# Patient Record
Sex: Male | Born: 1950 | Race: White | Hispanic: No | Marital: Married | Smoking: Former smoker
Health system: Southern US, Community
[De-identification: ages and names within clinical notes are randomized; demographics above are authoritative.]

## PROBLEM LIST (undated history)

## (undated) DIAGNOSIS — I499 Cardiac arrhythmia, unspecified: Secondary | ICD-10-CM

## (undated) DIAGNOSIS — R7881 Bacteremia: Secondary | ICD-10-CM

## (undated) DIAGNOSIS — R011 Cardiac murmur, unspecified: Secondary | ICD-10-CM

## (undated) DIAGNOSIS — D735 Infarction of spleen: Secondary | ICD-10-CM

## (undated) DIAGNOSIS — B952 Enterococcus as the cause of diseases classified elsewhere: Secondary | ICD-10-CM

## (undated) DIAGNOSIS — I509 Heart failure, unspecified: Secondary | ICD-10-CM

## (undated) DIAGNOSIS — G2581 Restless legs syndrome: Secondary | ICD-10-CM

## (undated) DIAGNOSIS — Z9889 Other specified postprocedural states: Secondary | ICD-10-CM

## (undated) DIAGNOSIS — Z951 Presence of aortocoronary bypass graft: Secondary | ICD-10-CM

## (undated) DIAGNOSIS — I2510 Atherosclerotic heart disease of native coronary artery without angina pectoris: Secondary | ICD-10-CM

## (undated) DIAGNOSIS — G43909 Migraine, unspecified, not intractable, without status migrainosus: Secondary | ICD-10-CM

## (undated) DIAGNOSIS — R413 Other amnesia: Secondary | ICD-10-CM

## (undated) DIAGNOSIS — I058 Other rheumatic mitral valve diseases: Secondary | ICD-10-CM

## (undated) DIAGNOSIS — M1990 Unspecified osteoarthritis, unspecified site: Secondary | ICD-10-CM

## (undated) DIAGNOSIS — E43 Unspecified severe protein-calorie malnutrition: Secondary | ICD-10-CM

## (undated) DIAGNOSIS — I351 Nonrheumatic aortic (valve) insufficiency: Secondary | ICD-10-CM

## (undated) DIAGNOSIS — F32A Depression, unspecified: Secondary | ICD-10-CM

## (undated) DIAGNOSIS — I639 Cerebral infarction, unspecified: Secondary | ICD-10-CM

## (undated) DIAGNOSIS — I340 Nonrheumatic mitral (valve) insufficiency: Secondary | ICD-10-CM

## (undated) DIAGNOSIS — I76 Septic arterial embolism: Secondary | ICD-10-CM

## (undated) DIAGNOSIS — R0600 Dyspnea, unspecified: Secondary | ICD-10-CM

## (undated) DIAGNOSIS — I358 Other nonrheumatic aortic valve disorders: Secondary | ICD-10-CM

## (undated) DIAGNOSIS — K759 Inflammatory liver disease, unspecified: Secondary | ICD-10-CM

## (undated) DIAGNOSIS — Z953 Presence of xenogenic heart valve: Secondary | ICD-10-CM

## (undated) DIAGNOSIS — G459 Transient cerebral ischemic attack, unspecified: Secondary | ICD-10-CM

## (undated) DIAGNOSIS — I480 Paroxysmal atrial fibrillation: Secondary | ICD-10-CM

## (undated) DIAGNOSIS — I4892 Unspecified atrial flutter: Secondary | ICD-10-CM

## (undated) DIAGNOSIS — C61 Malignant neoplasm of prostate: Secondary | ICD-10-CM

## (undated) DIAGNOSIS — I071 Rheumatic tricuspid insufficiency: Secondary | ICD-10-CM

## (undated) DIAGNOSIS — F329 Major depressive disorder, single episode, unspecified: Secondary | ICD-10-CM

## (undated) DIAGNOSIS — I10 Essential (primary) hypertension: Secondary | ICD-10-CM

## (undated) HISTORY — DX: Rheumatic tricuspid insufficiency: I07.1

## (undated) HISTORY — PX: PROSTATECTOMY: SHX69

## (undated) HISTORY — DX: Other amnesia: R41.3

## (undated) HISTORY — PX: TONSILLECTOMY: SUR1361

## (undated) HISTORY — PX: EYE SURGERY: SHX253

## (undated) HISTORY — DX: Migraine, unspecified, not intractable, without status migrainosus: G43.909

## (undated) HISTORY — DX: Nonrheumatic mitral (valve) insufficiency: I34.0

---
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
## 2007-06-21 DIAGNOSIS — F988 Other specified behavioral and emotional disorders with onset usually occurring in childhood and adolescence: Secondary | ICD-10-CM

## 2007-06-21 DIAGNOSIS — J309 Allergic rhinitis, unspecified: Secondary | ICD-10-CM

## 2008-08-08 ENCOUNTER — Emergency Department (HOSPITAL_COMMUNITY): Admission: EM | Admit: 2008-08-08 | Discharge: 2008-08-08 | Payer: Self-pay | Admitting: Emergency Medicine

## 2008-08-16 DIAGNOSIS — R51 Headache: Secondary | ICD-10-CM

## 2010-06-03 LAB — DIFFERENTIAL
Basophils Absolute: 0 10*3/uL (ref 0.0–0.1)
Eosinophils Relative: 2 % (ref 0–5)
Lymphocytes Relative: 29 % (ref 12–46)
Monocytes Absolute: 0.5 10*3/uL (ref 0.1–1.0)
Monocytes Relative: 11 % (ref 3–12)
Neutro Abs: 2.8 10*3/uL (ref 1.7–7.7)

## 2010-06-03 LAB — CBC
HCT: 45.3 % (ref 39.0–52.0)
Hemoglobin: 15.7 g/dL (ref 13.0–17.0)
MCHC: 34.7 g/dL (ref 30.0–36.0)
MCV: 89.7 fL (ref 78.0–100.0)
RBC: 5.05 MIL/uL (ref 4.22–5.81)
RDW: 13 % (ref 11.5–15.5)

## 2010-06-03 LAB — BASIC METABOLIC PANEL
CO2: 23 mEq/L (ref 19–32)
Calcium: 9.4 mg/dL (ref 8.4–10.5)
GFR calc Af Amer: >60 mL/min (ref >60)
GFR calc non Af Amer: >60 mL/min (ref >60)
Glucose, Bld: 105 mg/dL — ABNORMAL HIGH (ref 70–99)
Potassium: 4.2 mEq/L (ref 3.5–5.1)
Sodium: 137 mEq/L (ref 135–145)

## 2010-06-03 LAB — POCT CARDIAC MARKERS
CKMB, poc: <1 ng/mL — ABNORMAL LOW (ref 1.0–8.0)
Troponin i, poc: <0.05 ng/mL (ref 0.00–0.09)

## 2011-07-23 DIAGNOSIS — C61 Malignant neoplasm of prostate: Secondary | ICD-10-CM | POA: Diagnosis present

## 2014-10-17 ENCOUNTER — Inpatient Hospital Stay (HOSPITAL_COMMUNITY)
Admission: EM | Admit: 2014-10-17 | Discharge: 2014-11-12 | DRG: 853 | Disposition: A | Discharge status: Home Health | Payer: BC Managed Care – PPO | Attending: Thoracic Surgery (Cardiothoracic Vascular Surgery) | Admitting: Thoracic Surgery (Cardiothoracic Vascular Surgery)

## 2014-10-17 DIAGNOSIS — I63412 Cerebral infarction due to embolism of left middle cerebral artery: Secondary | ICD-10-CM | POA: Diagnosis present

## 2014-10-17 DIAGNOSIS — D739 Disease of spleen, unspecified: Secondary | ICD-10-CM

## 2014-10-17 DIAGNOSIS — I330 Acute and subacute infective endocarditis: Secondary | ICD-10-CM | POA: Diagnosis present

## 2014-10-17 DIAGNOSIS — I70209 Unspecified atherosclerosis of native arteries of extremities, unspecified extremity: Secondary | ICD-10-CM | POA: Diagnosis present

## 2014-10-17 DIAGNOSIS — K009 Disorder of tooth development, unspecified: Secondary | ICD-10-CM | POA: Diagnosis present

## 2014-10-17 DIAGNOSIS — D735 Infarction of spleen: Secondary | ICD-10-CM | POA: Diagnosis present

## 2014-10-17 DIAGNOSIS — E8770 Fluid overload, unspecified: Secondary | ICD-10-CM | POA: Diagnosis not present

## 2014-10-17 DIAGNOSIS — G9340 Encephalopathy, unspecified: Secondary | ICD-10-CM

## 2014-10-17 DIAGNOSIS — K0530 Chronic periodontitis, unspecified: Secondary | ICD-10-CM | POA: Diagnosis present

## 2014-10-17 DIAGNOSIS — I339 Acute and subacute endocarditis, unspecified: Secondary | ICD-10-CM | POA: Diagnosis present

## 2014-10-17 DIAGNOSIS — D62 Acute posthemorrhagic anemia: Secondary | ICD-10-CM | POA: Diagnosis not present

## 2014-10-17 DIAGNOSIS — R778 Other specified abnormalities of plasma proteins: Secondary | ICD-10-CM | POA: Diagnosis present

## 2014-10-17 DIAGNOSIS — Z953 Presence of xenogenic heart valve: Secondary | ICD-10-CM

## 2014-10-17 DIAGNOSIS — I059 Rheumatic mitral valve disease, unspecified: Secondary | ICD-10-CM | POA: Diagnosis present

## 2014-10-17 DIAGNOSIS — D7389 Other diseases of spleen: Secondary | ICD-10-CM

## 2014-10-17 DIAGNOSIS — E43 Unspecified severe protein-calorie malnutrition: Secondary | ICD-10-CM | POA: Diagnosis present

## 2014-10-17 DIAGNOSIS — J90 Pleural effusion, not elsewhere classified: Secondary | ICD-10-CM | POA: Diagnosis not present

## 2014-10-17 DIAGNOSIS — Z79899 Other long term (current) drug therapy: Secondary | ICD-10-CM

## 2014-10-17 DIAGNOSIS — J9600 Acute respiratory failure, unspecified whether with hypoxia or hypercapnia: Secondary | ICD-10-CM | POA: Diagnosis present

## 2014-10-17 DIAGNOSIS — B952 Enterococcus as the cause of diseases classified elsewhere: Secondary | ICD-10-CM | POA: Diagnosis present

## 2014-10-17 DIAGNOSIS — A419 Sepsis, unspecified organism: Principal | ICD-10-CM | POA: Diagnosis present

## 2014-10-17 DIAGNOSIS — R7881 Bacteremia: Secondary | ICD-10-CM

## 2014-10-17 DIAGNOSIS — I480 Paroxysmal atrial fibrillation: Secondary | ICD-10-CM | POA: Diagnosis present

## 2014-10-17 DIAGNOSIS — I4892 Unspecified atrial flutter: Secondary | ICD-10-CM | POA: Diagnosis present

## 2014-10-17 DIAGNOSIS — I058 Other rheumatic mitral valve diseases: Secondary | ICD-10-CM | POA: Diagnosis present

## 2014-10-17 DIAGNOSIS — Z8673 Personal history of transient ischemic attack (TIA), and cerebral infarction without residual deficits: Secondary | ICD-10-CM

## 2014-10-17 DIAGNOSIS — D649 Anemia, unspecified: Secondary | ICD-10-CM | POA: Diagnosis present

## 2014-10-17 DIAGNOSIS — A414 Sepsis due to anaerobes: Secondary | ICD-10-CM

## 2014-10-17 DIAGNOSIS — R739 Hyperglycemia, unspecified: Secondary | ICD-10-CM | POA: Diagnosis present

## 2014-10-17 DIAGNOSIS — Z951 Presence of aortocoronary bypass graft: Secondary | ICD-10-CM

## 2014-10-17 DIAGNOSIS — Z880 Allergy status to penicillin: Secondary | ICD-10-CM

## 2014-10-17 DIAGNOSIS — Z87891 Personal history of nicotine dependence: Secondary | ICD-10-CM

## 2014-10-17 DIAGNOSIS — I639 Cerebral infarction, unspecified: Secondary | ICD-10-CM | POA: Diagnosis present

## 2014-10-17 DIAGNOSIS — I509 Heart failure, unspecified: Secondary | ICD-10-CM

## 2014-10-17 DIAGNOSIS — R21 Rash and other nonspecific skin eruption: Secondary | ICD-10-CM | POA: Diagnosis present

## 2014-10-17 DIAGNOSIS — J9601 Acute respiratory failure with hypoxia: Secondary | ICD-10-CM | POA: Diagnosis present

## 2014-10-17 DIAGNOSIS — I38 Endocarditis, valve unspecified: Secondary | ICD-10-CM

## 2014-10-17 DIAGNOSIS — C61 Malignant neoplasm of prostate: Secondary | ICD-10-CM | POA: Diagnosis present

## 2014-10-17 DIAGNOSIS — I76 Septic arterial embolism: Secondary | ICD-10-CM | POA: Diagnosis present

## 2014-10-17 DIAGNOSIS — I351 Nonrheumatic aortic (valve) insufficiency: Secondary | ICD-10-CM | POA: Diagnosis present

## 2014-10-17 DIAGNOSIS — G2581 Restless legs syndrome: Secondary | ICD-10-CM | POA: Diagnosis present

## 2014-10-17 DIAGNOSIS — G4700 Insomnia, unspecified: Secondary | ICD-10-CM | POA: Diagnosis not present

## 2014-10-17 DIAGNOSIS — Z9079 Acquired absence of other genital organ(s): Secondary | ICD-10-CM | POA: Diagnosis present

## 2014-10-17 DIAGNOSIS — R7989 Other specified abnormal findings of blood chemistry: Secondary | ICD-10-CM

## 2014-10-17 DIAGNOSIS — R42 Dizziness and giddiness: Secondary | ICD-10-CM

## 2014-10-17 DIAGNOSIS — D72829 Elevated white blood cell count, unspecified: Secondary | ICD-10-CM | POA: Diagnosis present

## 2014-10-17 DIAGNOSIS — Z9889 Other specified postprocedural states: Secondary | ICD-10-CM

## 2014-10-17 DIAGNOSIS — I2510 Atherosclerotic heart disease of native coronary artery without angina pectoris: Secondary | ICD-10-CM | POA: Diagnosis present

## 2014-10-17 DIAGNOSIS — J9811 Atelectasis: Secondary | ICD-10-CM | POA: Diagnosis not present

## 2014-10-17 DIAGNOSIS — Z8546 Personal history of malignant neoplasm of prostate: Secondary | ICD-10-CM

## 2014-10-17 DIAGNOSIS — I340 Nonrheumatic mitral (valve) insufficiency: Secondary | ICD-10-CM | POA: Diagnosis present

## 2014-10-17 DIAGNOSIS — E871 Hypo-osmolality and hyponatremia: Secondary | ICD-10-CM | POA: Diagnosis present

## 2014-10-17 DIAGNOSIS — Z8679 Personal history of other diseases of the circulatory system: Secondary | ICD-10-CM | POA: Diagnosis present

## 2014-10-17 DIAGNOSIS — I358 Other nonrheumatic aortic valve disorders: Secondary | ICD-10-CM | POA: Diagnosis present

## 2014-10-17 HISTORY — DX: Septic arterial embolism: I76

## 2014-10-17 HISTORY — DX: Nonrheumatic aortic (valve) insufficiency: I35.1

## 2014-10-17 HISTORY — DX: Restless legs syndrome: G25.81

## 2014-10-17 HISTORY — DX: Presence of xenogenic heart valve: Z95.3

## 2014-10-17 HISTORY — DX: Nonrheumatic mitral (valve) insufficiency: I34.0

## 2014-10-17 HISTORY — DX: Unspecified severe protein-calorie malnutrition: E43

## 2014-10-17 HISTORY — DX: Other nonrheumatic aortic valve disorders: I35.8

## 2014-10-17 HISTORY — DX: Unspecified atrial flutter: I48.92

## 2014-10-17 HISTORY — DX: Bacteremia: R78.81

## 2014-10-17 HISTORY — DX: Infarction of spleen: D73.5

## 2014-10-17 HISTORY — DX: Malignant neoplasm of prostate: C61

## 2014-10-17 HISTORY — DX: Other rheumatic mitral valve diseases: I05.8

## 2014-10-17 HISTORY — DX: Presence of aortocoronary bypass graft: Z95.1

## 2014-10-17 HISTORY — DX: Other specified postprocedural states: Z98.890

## 2014-10-17 HISTORY — DX: Enterococcus as the cause of diseases classified elsewhere: B95.2

## 2014-10-17 HISTORY — DX: Atherosclerotic heart disease of native coronary artery without angina pectoris: I25.10

## 2014-10-17 HISTORY — DX: Inflammatory liver disease, unspecified: K75.9

## 2014-10-17 HISTORY — DX: Paroxysmal atrial fibrillation: I48.0

## 2014-10-18 ENCOUNTER — Emergency Department (HOSPITAL_COMMUNITY): Payer: BC Managed Care – PPO

## 2014-10-18 ENCOUNTER — Encounter (HOSPITAL_COMMUNITY): Payer: Self-pay | Admitting: Emergency Medicine

## 2014-10-18 DIAGNOSIS — I39 Endocarditis and heart valve disorders in diseases classified elsewhere: Secondary | ICD-10-CM | POA: Diagnosis not present

## 2014-10-18 DIAGNOSIS — J9601 Acute respiratory failure with hypoxia: Secondary | ICD-10-CM | POA: Diagnosis present

## 2014-10-18 DIAGNOSIS — Z8546 Personal history of malignant neoplasm of prostate: Secondary | ICD-10-CM | POA: Diagnosis not present

## 2014-10-18 DIAGNOSIS — R42 Dizziness and giddiness: Secondary | ICD-10-CM

## 2014-10-18 DIAGNOSIS — E871 Hypo-osmolality and hyponatremia: Secondary | ICD-10-CM | POA: Diagnosis present

## 2014-10-18 DIAGNOSIS — I340 Nonrheumatic mitral (valve) insufficiency: Secondary | ICD-10-CM | POA: Diagnosis present

## 2014-10-18 DIAGNOSIS — I4892 Unspecified atrial flutter: Secondary | ICD-10-CM | POA: Diagnosis present

## 2014-10-18 DIAGNOSIS — E8770 Fluid overload, unspecified: Secondary | ICD-10-CM | POA: Diagnosis not present

## 2014-10-18 DIAGNOSIS — I70209 Unspecified atherosclerosis of native arteries of extremities, unspecified extremity: Secondary | ICD-10-CM | POA: Diagnosis present

## 2014-10-18 DIAGNOSIS — I38 Endocarditis, valve unspecified: Secondary | ICD-10-CM | POA: Diagnosis not present

## 2014-10-18 DIAGNOSIS — I358 Other nonrheumatic aortic valve disorders: Secondary | ICD-10-CM | POA: Diagnosis not present

## 2014-10-18 DIAGNOSIS — I330 Acute and subacute infective endocarditis: Secondary | ICD-10-CM | POA: Diagnosis present

## 2014-10-18 DIAGNOSIS — R7989 Other specified abnormal findings of blood chemistry: Secondary | ICD-10-CM | POA: Diagnosis not present

## 2014-10-18 DIAGNOSIS — G9340 Encephalopathy, unspecified: Secondary | ICD-10-CM | POA: Diagnosis present

## 2014-10-18 DIAGNOSIS — I2510 Atherosclerotic heart disease of native coronary artery without angina pectoris: Secondary | ICD-10-CM | POA: Diagnosis present

## 2014-10-18 DIAGNOSIS — I351 Nonrheumatic aortic (valve) insufficiency: Secondary | ICD-10-CM | POA: Diagnosis present

## 2014-10-18 DIAGNOSIS — I639 Cerebral infarction, unspecified: Secondary | ICD-10-CM | POA: Diagnosis not present

## 2014-10-18 DIAGNOSIS — Z87891 Personal history of nicotine dependence: Secondary | ICD-10-CM | POA: Diagnosis not present

## 2014-10-18 DIAGNOSIS — D62 Acute posthemorrhagic anemia: Secondary | ICD-10-CM | POA: Diagnosis not present

## 2014-10-18 DIAGNOSIS — G2581 Restless legs syndrome: Secondary | ICD-10-CM | POA: Diagnosis present

## 2014-10-18 DIAGNOSIS — R21 Rash and other nonspecific skin eruption: Secondary | ICD-10-CM | POA: Diagnosis present

## 2014-10-18 DIAGNOSIS — I4891 Unspecified atrial fibrillation: Secondary | ICD-10-CM | POA: Diagnosis not present

## 2014-10-18 DIAGNOSIS — Z79899 Other long term (current) drug therapy: Secondary | ICD-10-CM | POA: Diagnosis not present

## 2014-10-18 DIAGNOSIS — K0530 Chronic periodontitis, unspecified: Secondary | ICD-10-CM | POA: Diagnosis present

## 2014-10-18 DIAGNOSIS — J90 Pleural effusion, not elsewhere classified: Secondary | ICD-10-CM | POA: Diagnosis not present

## 2014-10-18 DIAGNOSIS — E43 Unspecified severe protein-calorie malnutrition: Secondary | ICD-10-CM | POA: Diagnosis present

## 2014-10-18 DIAGNOSIS — I058 Other rheumatic mitral valve diseases: Secondary | ICD-10-CM | POA: Diagnosis not present

## 2014-10-18 DIAGNOSIS — Z9079 Acquired absence of other genital organ(s): Secondary | ICD-10-CM | POA: Diagnosis present

## 2014-10-18 DIAGNOSIS — B952 Enterococcus as the cause of diseases classified elsewhere: Secondary | ICD-10-CM | POA: Diagnosis present

## 2014-10-18 DIAGNOSIS — A419 Sepsis, unspecified organism: Secondary | ICD-10-CM | POA: Diagnosis present

## 2014-10-18 DIAGNOSIS — D649 Anemia, unspecified: Secondary | ICD-10-CM | POA: Diagnosis not present

## 2014-10-18 DIAGNOSIS — Z880 Allergy status to penicillin: Secondary | ICD-10-CM | POA: Diagnosis not present

## 2014-10-18 DIAGNOSIS — Z8673 Personal history of transient ischemic attack (TIA), and cerebral infarction without residual deficits: Secondary | ICD-10-CM | POA: Diagnosis not present

## 2014-10-18 DIAGNOSIS — I350 Nonrheumatic aortic (valve) stenosis: Secondary | ICD-10-CM | POA: Diagnosis not present

## 2014-10-18 DIAGNOSIS — G969 Disorder of central nervous system, unspecified: Secondary | ICD-10-CM | POA: Diagnosis not present

## 2014-10-18 DIAGNOSIS — D735 Infarction of spleen: Secondary | ICD-10-CM | POA: Diagnosis present

## 2014-10-18 DIAGNOSIS — I059 Rheumatic mitral valve disease, unspecified: Secondary | ICD-10-CM | POA: Diagnosis not present

## 2014-10-18 DIAGNOSIS — I2690 Septic pulmonary embolism without acute cor pulmonale: Secondary | ICD-10-CM | POA: Diagnosis not present

## 2014-10-18 DIAGNOSIS — R7881 Bacteremia: Secondary | ICD-10-CM | POA: Diagnosis not present

## 2014-10-18 DIAGNOSIS — I63412 Cerebral infarction due to embolism of left middle cerebral artery: Secondary | ICD-10-CM | POA: Diagnosis present

## 2014-10-18 DIAGNOSIS — J9811 Atelectasis: Secondary | ICD-10-CM | POA: Diagnosis not present

## 2014-10-18 DIAGNOSIS — Z01810 Encounter for preprocedural cardiovascular examination: Secondary | ICD-10-CM | POA: Diagnosis not present

## 2014-10-18 DIAGNOSIS — I748 Embolism and thrombosis of other arteries: Secondary | ICD-10-CM | POA: Diagnosis not present

## 2014-10-18 DIAGNOSIS — R739 Hyperglycemia, unspecified: Secondary | ICD-10-CM | POA: Diagnosis present

## 2014-10-18 DIAGNOSIS — K009 Disorder of tooth development, unspecified: Secondary | ICD-10-CM | POA: Diagnosis not present

## 2014-10-18 DIAGNOSIS — I76 Septic arterial embolism: Secondary | ICD-10-CM | POA: Diagnosis present

## 2014-10-18 DIAGNOSIS — I080 Rheumatic disorders of both mitral and aortic valves: Secondary | ICD-10-CM | POA: Diagnosis not present

## 2014-10-18 DIAGNOSIS — G4700 Insomnia, unspecified: Secondary | ICD-10-CM | POA: Diagnosis not present

## 2014-10-18 DIAGNOSIS — I480 Paroxysmal atrial fibrillation: Secondary | ICD-10-CM | POA: Diagnosis present

## 2014-10-18 DIAGNOSIS — Z48812 Encounter for surgical aftercare following surgery on the circulatory system: Secondary | ICD-10-CM | POA: Diagnosis not present

## 2014-10-18 DIAGNOSIS — D72829 Elevated white blood cell count, unspecified: Secondary | ICD-10-CM | POA: Diagnosis not present

## 2014-10-18 LAB — URINALYSIS, ROUTINE W REFLEX MICROSCOPIC
BILIRUBIN URINE: NEGATIVE
GLUCOSE, UA: NEGATIVE mg/dL
Ketones, ur: NEGATIVE mg/dL
Leukocytes, UA: NEGATIVE
Nitrite: NEGATIVE
PH: 6 (ref 5.0–8.0)
Protein, ur: 30 mg/dL — AB
SPECIFIC GRAVITY, URINE: 1.016 (ref 1.005–1.030)
Urobilinogen, UA: 1 mg/dL (ref 0.0–1.0)

## 2014-10-18 LAB — COMPREHENSIVE METABOLIC PANEL
ALBUMIN: 2.4 g/dL — AB (ref 3.5–5.0)
ALK PHOS: 40 U/L (ref 38–126)
ALT: 18 U/L (ref 17–63)
AST: 22 U/L (ref 15–41)
Anion gap: 8 (ref 5–15)
BUN: 12 mg/dL (ref 6–20)
CALCIUM: 8.3 mg/dL — AB (ref 8.9–10.3)
CHLORIDE: 100 mmol/L — AB (ref 101–111)
CO2: 21 mmol/L — AB (ref 22–32)
CREATININE: 1.2 mg/dL (ref 0.61–1.24)
GFR calc non Af Amer: >60 mL/min (ref >60)
GLUCOSE: 118 mg/dL — AB (ref 65–99)
Potassium: 4 mmol/L (ref 3.5–5.1)
SODIUM: 129 mmol/L — AB (ref 135–145)
Total Bilirubin: 0.8 mg/dL (ref 0.3–1.2)
Total Protein: 6.4 g/dL — ABNORMAL LOW (ref 6.5–8.1)

## 2014-10-18 LAB — I-STAT ARTERIAL BLOOD GAS, ED
ACID-BASE DEFICIT: 6 mmol/L — AB (ref 0.0–2.0)
BICARBONATE: 18.2 meq/L — AB (ref 20.0–24.0)
O2 SAT: 92 %
PH ART: 7.4 (ref 7.350–7.450)
PO2 ART: 62 mmHg — AB (ref 80.0–100.0)
TCO2: 19 mmol/L (ref 0–100)
pCO2 arterial: 29.4 mmHg — ABNORMAL LOW (ref 35.0–45.0)

## 2014-10-18 LAB — RAPID URINE DRUG SCREEN, HOSP PERFORMED
AMPHETAMINES: NOT DETECTED
BARBITURATES: NOT DETECTED
BENZODIAZEPINES: NOT DETECTED
Cocaine: NOT DETECTED
Opiates: NOT DETECTED
Tetrahydrocannabinol: NOT DETECTED

## 2014-10-18 LAB — CBC WITH DIFFERENTIAL/PLATELET
BASOS ABS: 0 10*3/uL (ref 0.0–0.1)
BASOS PCT: 0 % (ref 0–1)
Eosinophils Absolute: 0 10*3/uL (ref 0.0–0.7)
Eosinophils Relative: 0 % (ref 0–5)
HEMATOCRIT: 28.4 % — AB (ref 39.0–52.0)
Hemoglobin: 9.2 g/dL — ABNORMAL LOW (ref 13.0–17.0)
Lymphocytes Relative: 11 % — ABNORMAL LOW (ref 12–46)
Lymphs Abs: 1 10*3/uL (ref 0.7–4.0)
MCH: 27.3 pg (ref 26.0–34.0)
MCHC: 32.4 g/dL (ref 30.0–36.0)
MCV: 84.3 fL (ref 78.0–100.0)
MONO ABS: 0.9 10*3/uL (ref 0.1–1.0)
Monocytes Relative: 10 % (ref 3–12)
NEUTROS ABS: 7 10*3/uL (ref 1.7–7.7)
Neutrophils Relative %: 79 % — ABNORMAL HIGH (ref 43–77)
PLATELETS: 231 10*3/uL (ref 150–400)
RBC: 3.37 MIL/uL — ABNORMAL LOW (ref 4.22–5.81)
RDW: 14.9 % (ref 11.5–15.5)
WBC: 8.9 10*3/uL (ref 4.0–10.5)

## 2014-10-18 LAB — RETICULOCYTES
RBC.: 3.24 MIL/uL — AB (ref 4.22–5.81)
RETIC CT PCT: 1 % (ref 0.4–3.1)
Retic Count, Absolute: 32.4 10*3/uL (ref 19.0–186.0)

## 2014-10-18 LAB — PROTIME-INR
INR: 1.39 (ref 0.00–1.49)
Prothrombin Time: 17.1 seconds — ABNORMAL HIGH (ref 11.6–15.2)

## 2014-10-18 LAB — APTT: aPTT: 34 seconds (ref 24–37)

## 2014-10-18 LAB — I-STAT CG4 LACTIC ACID, ED
LACTIC ACID, VENOUS: 0.75 mmol/L (ref 0.5–2.0)
LACTIC ACID, VENOUS: 0.66 mmol/L (ref 0.5–2.0)

## 2014-10-18 LAB — URINE MICROSCOPIC-ADD ON

## 2014-10-18 LAB — TYPE AND SCREEN
ABO/RH(D): B POS
ANTIBODY SCREEN: NEGATIVE

## 2014-10-18 LAB — IRON AND TIBC
Iron: 8 ug/dL — ABNORMAL LOW (ref 45–182)
Saturation Ratios: 5 % — ABNORMAL LOW (ref 17.9–39.5)
TIBC: 151 ug/dL — ABNORMAL LOW (ref 250–450)
UIBC: 143 ug/dL

## 2014-10-18 LAB — C-REACTIVE PROTEIN: CRP: 9.9 mg/dL — ABNORMAL HIGH (ref <1.0)

## 2014-10-18 LAB — MRSA PCR SCREENING: MRSA BY PCR: NEGATIVE

## 2014-10-18 LAB — FERRITIN: Ferritin: 332 ng/mL (ref 24–336)

## 2014-10-18 LAB — ABO/RH: ABO/RH(D): B POS

## 2014-10-18 LAB — LIPASE, BLOOD: LIPASE: 72 U/L — AB (ref 22–51)

## 2014-10-18 LAB — MAGNESIUM: MAGNESIUM: 1.7 mg/dL (ref 1.7–2.4)

## 2014-10-18 LAB — VITAMIN B12: VITAMIN B 12: 364 pg/mL (ref 180–914)

## 2014-10-18 LAB — FOLATE: Folate: 7.4 ng/mL (ref >5.9)

## 2014-10-18 LAB — TSH: TSH: 1.708 u[IU]/mL (ref 0.350–4.500)

## 2014-10-18 LAB — HIV ANTIBODY (ROUTINE TESTING W REFLEX): HIV Screen 4th Generation wRfx: NONREACTIVE

## 2014-10-18 LAB — POC OCCULT BLOOD, ED: Fecal Occult Bld: NEGATIVE

## 2014-10-18 LAB — PROCALCITONIN: PROCALCITONIN: 0.99 ng/mL

## 2014-10-18 LAB — SEDIMENTATION RATE: SED RATE: 64 mm/h — AB (ref 0–16)

## 2014-10-18 MED ORDER — SODIUM CHLORIDE 0.9 % IV SOLN
1000.0000 mL | Freq: Once | INTRAVENOUS | Status: AC
  Administered 2014-10-18: 1000 mL via INTRAVENOUS

## 2014-10-18 MED ORDER — ONDANSETRON HCL 4 MG/2ML IJ SOLN
4.0000 mg | Freq: Four times a day (QID) | INTRAMUSCULAR | Status: DC | PRN
  Administered 2014-10-24 – 2014-10-31 (×3): 4 mg via INTRAVENOUS
  Filled 2014-10-18 (×3): qty 2

## 2014-10-18 MED ORDER — ONDANSETRON HCL 4 MG PO TABS: 4.0000 mg | ORAL_TABLET | Freq: Four times a day (QID) | ORAL | Status: DC | PRN

## 2014-10-18 MED ORDER — ACETAMINOPHEN 500 MG PO TABS
1000.0000 mg | ORAL_TABLET | Freq: Once | ORAL | Status: AC
  Administered 2014-10-18: 1000 mg via ORAL
  Filled 2014-10-18: qty 2

## 2014-10-18 MED ORDER — SODIUM CHLORIDE 0.9 % IV BOLUS (SEPSIS)
1000.0000 mL | Freq: Once | INTRAVENOUS | Status: AC
Start: 2014-10-18 — End: 2014-10-18
  Administered 2014-10-18: 1000 mL via INTRAVENOUS

## 2014-10-18 MED ORDER — CEFTRIAXONE SODIUM 1 G IJ SOLR: 1.0000 g | INTRAMUSCULAR | Status: DC

## 2014-10-18 MED ORDER — ONDANSETRON HCL 4 MG/2ML IJ SOLN
4.0000 mg | Freq: Four times a day (QID) | INTRAMUSCULAR | Status: DC | PRN
  Administered 2014-10-18: 4 mg via INTRAVENOUS
  Filled 2014-10-18: qty 2

## 2014-10-18 MED ORDER — METRONIDAZOLE IN NACL 5-0.79 MG/ML-% IV SOLN
500.0000 mg | Freq: Three times a day (TID) | INTRAVENOUS | Status: DC
  Administered 2014-10-18 – 2014-10-19 (×4): 500 mg via INTRAVENOUS
  Filled 2014-10-18 (×5): qty 100

## 2014-10-18 MED ORDER — DOXYCYCLINE HYCLATE 100 MG IV SOLR
100.0000 mg | Freq: Once | INTRAVENOUS | Status: AC
  Administered 2014-10-18: 100 mg via INTRAVENOUS
  Filled 2014-10-18: qty 100

## 2014-10-18 MED ORDER — SODIUM CHLORIDE 0.9 % IV SOLN
1000.0000 mL | INTRAVENOUS | Status: DC
  Administered 2014-10-18: 1000 mL via INTRAVENOUS

## 2014-10-18 MED ORDER — SODIUM CHLORIDE 0.9 % IJ SOLN
3.0000 mL | Freq: Two times a day (BID) | INTRAMUSCULAR | Status: DC
  Administered 2014-10-18 – 2014-10-22 (×10): 3 mL via INTRAVENOUS

## 2014-10-18 MED ORDER — DEXTROSE 5 % IV SOLN
2.0000 g | INTRAVENOUS | Status: DC
  Administered 2014-10-18: 2 g via INTRAVENOUS
  Filled 2014-10-18: qty 2

## 2014-10-18 MED ORDER — CIPROFLOXACIN IN D5W 400 MG/200ML IV SOLN
400.0000 mg | Freq: Two times a day (BID) | INTRAVENOUS | Status: DC
  Administered 2014-10-18: 400 mg via INTRAVENOUS
  Filled 2014-10-18: qty 200

## 2014-10-18 MED ORDER — DOXYCYCLINE HYCLATE 100 MG IV SOLR
100.0000 mg | Freq: Two times a day (BID) | INTRAVENOUS | Status: DC
  Administered 2014-10-18 – 2014-10-19 (×2): 100 mg via INTRAVENOUS
  Filled 2014-10-18 (×3): qty 100

## 2014-10-18 MED ORDER — PRAMIPEXOLE DIHYDROCHLORIDE 1.5 MG PO TABS
1.5000 mg | ORAL_TABLET | Freq: Every day | ORAL | Status: DC
  Administered 2014-10-19 – 2014-10-31 (×13): 1.5 mg via ORAL
  Filled 2014-10-18 (×19): qty 1

## 2014-10-18 MED ORDER — ACETAMINOPHEN 325 MG PO TABS
650.0000 mg | ORAL_TABLET | Freq: Four times a day (QID) | ORAL | Status: DC | PRN
  Administered 2014-10-21 (×2): 650 mg via ORAL
  Filled 2014-10-18 (×2): qty 2

## 2014-10-18 MED ORDER — HEPARIN SODIUM (PORCINE) 5000 UNIT/ML IJ SOLN
5000.0000 [IU] | Freq: Three times a day (TID) | INTRAMUSCULAR | Status: DC
  Administered 2014-10-18 – 2014-10-23 (×13): 5000 [IU] via SUBCUTANEOUS
  Filled 2014-10-18 (×11): qty 1

## 2014-10-18 MED ORDER — SODIUM CHLORIDE 0.9 % IV SOLN
INTRAVENOUS | Status: DC
  Administered 2014-10-18: 1000 mL via INTRAVENOUS
  Administered 2014-10-19: 02:00:00 via INTRAVENOUS
  Administered 2014-10-19: 1000 mL via INTRAVENOUS
  Administered 2014-10-20 – 2014-10-23 (×6): via INTRAVENOUS

## 2014-10-18 NOTE — Progress Notes (Signed)
Utilization review completed. Chinonso Linker, RN, BSN. 

## 2014-10-18 NOTE — ED Notes (Signed)
RT called to assess pt d/t increased work of breathing

## 2014-10-18 NOTE — ED Provider Notes (Signed)
Medical screening examination/treatment/procedure(s) were conducted as a shared visit with non-physician practitioner(s) and myself.  I personally evaluated the patient during the encounter.   EKG Interpretation   Date/Time:  Tuesday October 17 2014 23:55:33 EDT Ventricular Rate:  102 PR Interval:  138 QRS Duration: 79 QT Interval:  313 QTC Calculation: 408 R Axis:   81 Text Interpretation:  Sinus tachycardia Atrial premature complexes  Probable left atrial enlargement Borderline right axis deviation Probable  LVH with secondary repol abnrm Nonspecific T wave abnormality inferior and  lateral leads Confirmed by [employer]  MD, Aum Caggiano ([id number]) on 10/17/2014  11:58:01 PM      Patient presents with confusion and lethargy. Per patient and his wife, he just hasn't felt well lately.  Was warm to touch. Initial temperature 100.9. Blood pressure 100/57. Patient denies any cough, shortness of breath, nausea, vomiting, abdominal pain. No headache or neck pain or stiffness.  Patient does have a petechial rash in bilateral lower extremities.   No meningismus on exam. Intermittently confused and inappropriately answers questions but is alert and oriented 3. Sepsis workup initiated. Patient given 3 L of fluid. Lactate normal. No evidence of leukocytosis. No evidence of pneumonia or urinary tract infection. Patient does have take exposures. No known tick bite. Given petechial rash on lower extremities and mild hyponatremia, will cover with doxycycline.  Blood pressure remains marginal with 90 systolic.  Discuss with hospitalist. He is requesting critical care evaluation.  6:51 AM Patient blood pressure stabilized. Hospital agrees to admit. He has not been evaluated by critical care at this time.  Merryl Hacker, MD 10/18/14 [phone]

## 2014-10-18 NOTE — ED Notes (Signed)
Admitting MD at bedside.

## 2014-10-18 NOTE — Progress Notes (Signed)
CRITICAL VALUE ALERT  Critical value received:  Gm (+) cooci in pairs  Date of notification:  10/18/2014   Time of notification: 1300  Critical value read back:yes  Nurse who received alert:  Donnella Bi, RN  MD notified (1st page): Dr. Conley Canal  Time of first page:  40  MD notified (2nd page):  Time of second page:  Responding MD:    Time MD responded:

## 2014-10-18 NOTE — ED Notes (Signed)
Patient arrived via EMS. EMS reports: patient found asleep by security guard at work (works at [employer]), and that patient seemed confused and lethargic. EMS called. Patient felt hot to touch. Temp 101.2. BP 100/57, Pulse 98, Resp 16, SPO2 98% room air. GCS=15. CBG 133. ST with freq PACs. Patient reported that he has low RBC, and has appt on Friday with MD. Denies pain.

## 2014-10-18 NOTE — ED Notes (Signed)
Patient transported to X-ray 

## 2014-10-18 NOTE — ED Notes (Signed)
Hospitalist at bedside with patient/spouse.

## 2014-10-18 NOTE — ED Notes (Signed)
Pt with 6 beat run of vtach captured on monitor. Notified primary RN Narda Rutherford.

## 2014-10-18 NOTE — ED Provider Notes (Signed)
Care assumed from [place], [place].  Alan [name] is a 64 y.o. male presents with mild hypotension, hyponatremia and likely tick born illness.  Petechial rash.  No nuchal rigidity, no meningeal signs.  Pt with hx tick bites and who works outside.  Consult with hospitalist who asks for consult from [facility] due to soft BP.    Physical Exam  BP 95/55 mmHg  Pulse 78  Temp(Src) 98 F (36.7 C) (Oral)  Resp 16  Ht 6' (1.829 m)  Wt 165 lb (74.844 kg)  BMI 22.37 kg/m2  SpO2 96%  Physical Exam   Face to face Exam:   General: Awake  HEENT: Atraumatic  Resp: Normal effort, clear and equal breath sounds  Abd: Nondistended MSK: no nuchal rigidity  Neuro:No focal weakness  Lymph: No adenopathy Skin: petechiae    ED Course  Procedures  1. Restless leg syndrome   2. Prostate cancer    MDM  Plan: Pt evaluated by Blaine Hamper for admission but requesting PCCM consult for hypotension.    7:28 AM Pt with improving BP.  Mild hypoxia noted to 86-88% on RA.  Pt placed on 2lmp via [place].  Pt evaluated by PCCM who feels comfortable moving the patient to the floor.    BP 122/67 mmHg  Pulse 90  Temp(Src) 97.8 F (36.6 C) (Oral)  Resp 18  Ht 6' (1.829 m)  Wt 165 lb (74.844 kg)  BMI 22.37 kg/m2  SpO2 93%   Abigail Butts, PA-C 10/18/14 [place], MD 10/19/14 [phone]

## 2014-10-18 NOTE — Progress Notes (Addendum)
Called by ED RN about tachypnea NSVT and hypoxia and hypotension and request for ABG, given.  When I arrived, patient having rigors. Sat 95%. SBP in the 120s. C/o nausea. Had 3 watery stools.  Chart reviewed. abd soft. Petechial rash noted on legs.  Will get GI pathogen panel, mag, tsh, start empiric cipro and flagyl for empiric coverage of sepsis with rash, diarrhea, continue doxycycline. Lungs CTA. Tachycardic into 130s. [place] for SDU at this time.  Time 35 minutes  Doree Barthel MD [facility]

## 2014-10-18 NOTE — ED Notes (Signed)
Pt ambulated to the restroom.

## 2014-10-18 NOTE — ED Provider Notes (Signed)
CSN: [id number]     Arrival date & time 10/17/14  2348 History   First MD Initiated Contact with Patient 10/17/14 2352     Chief Complaint  Patient presents with  . Fever  . Fatigue     (Consider location/radiation/quality/duration/timing/severity/associated sxs/prior Treatment) HPI Comments: Patient arrived via EMS. EMS reports: patient found asleep by security guard at work (works at [employer]), and that patient seemed confused and lethargic. EMS called. Patient felt hot to touch. Temp 101.2. BP 100/57, Pulse 98, Resp 16, SPO2 98% room air. GCS=15. CBG 133. ST with freq PACs. Patient reported that he has low RBC, and has appt on Friday with MD. Denies pain.    Past Medical History  Diagnosis Date  . Restless leg syndrome   . Prostate cancer    Past Surgical History  Procedure Laterality Date  . Prostatectomy     No family history on file. Social History  Substance Use Topics  . Smoking status: Former Research scientist (life sciences)  . Smokeless tobacco: None  . Alcohol Use: No    Review of Systems  Constitutional: Positive for fever and fatigue.  All other systems reviewed and are negative.     Allergies  Penicillins  Home Medications   Prior to Admission medications   Medication Sig Start Date End Date Taking? Authorizing Provider  pramipexole (MIRAPEX) 1.5 MG tablet Take 1.5 mg by mouth at bedtime.   Yes Historical Provider, MD   BP 89/46 mmHg  Pulse 98  Temp(Src) 98.9 F (37.2 C) (Oral)  Resp 27  Ht 6' (1.829 m)  Wt 165 lb 11.2 oz (75.161 kg)  BMI 22.47 kg/m2  SpO2 95% Physical Exam  Constitutional: He is oriented to person, place, and time. He appears well-developed and well-nourished. No distress.  HENT:  Head: Normocephalic and atraumatic.  Right Ear: External ear normal.  Left Ear: External ear normal.  Nose: Nose normal.  Mouth/Throat: Oropharynx is clear and moist. No oropharyngeal exudate.  Eyes: Conjunctivae and EOM are normal. Pupils are equal,  round, and reactive to light.  Neck: Normal range of motion. Neck supple.  No nuchal rigidity.   Cardiovascular: Normal rate, regular rhythm, normal heart sounds and intact distal pulses.   Pulmonary/Chest: Effort normal and breath sounds normal. No respiratory distress.  Abdominal: Soft. There is no tenderness.  Musculoskeletal: Normal range of motion.  Neurological: He is alert and oriented to person, place, and time. He has normal strength. No cranial nerve deficit. Gait normal. GCS eye subscore is 4. GCS verbal subscore is 5. GCS motor subscore is 6.  Sensation grossly intact.  No pronator drift.  Bilateral heel-knee-shin intact.  Skin: Skin is warm and dry. Petechiae noted. He is not diaphoretic.  Nursing note and vitals reviewed.   ED Course  Procedures (including critical care time) Medications  acetaminophen (TYLENOL) tablet 650 mg (not administered)  doxycycline (VIBRAMYCIN) 100 mg in dextrose 5 % 250 mL IVPB (100 mg Intravenous Given 10/18/14 1728)  pramipexole (MIRAPEX) tablet 1.5 mg (not administered)  heparin injection 5,000 Units (5,000 Units Subcutaneous Given 10/18/14 1349)  sodium chloride 0.9 % injection 3 mL (3 mLs Intravenous Given 10/18/14 1348)  ondansetron (ZOFRAN) tablet 4 mg (not administered)    Or  ondansetron (ZOFRAN) injection 4 mg (not administered)  0.9 %  sodium chloride infusion (1,000 mLs Intravenous New Bag/Given 10/18/14 1347)  metroNIDAZOLE (FLAGYL) IVPB 500 mg (500 mg Intravenous New Bag/Given 10/18/14 1728)  cefTRIAXone (ROCEPHIN) 2 g in dextrose 5 %  50 mL IVPB (2 g Intravenous Given 10/18/14 1700)  0.9 %  sodium chloride infusion (0 mLs Intravenous Stopped 10/18/14 0257)    Followed by  0.9 %  sodium chloride infusion (0 mLs Intravenous Stopped 10/18/14 0258)    Followed by  0.9 %  sodium chloride infusion (0 mLs Intravenous Stopped 10/18/14 0423)  acetaminophen (TYLENOL) tablet 1,000 mg (1,000 mg Oral Given 10/18/14 0253)  doxycycline (VIBRAMYCIN) 100  mg in dextrose 5 % 250 mL IVPB (0 mg Intravenous Stopped 10/18/14 0715)  sodium chloride 0.9 % bolus 1,000 mL (0 mLs Intravenous Stopped 10/18/14 0715)  sodium chloride 0.9 % bolus 1,000 mL (0 mLs Intravenous Stopped 10/18/14 0715)    Labs Review Labs Reviewed  COMPREHENSIVE METABOLIC PANEL - Abnormal; Notable for the following:    Sodium 129 (*)    Chloride 100 (*)    CO2 21 (*)    Glucose, Bld 118 (*)    Calcium 8.3 (*)    Total Protein 6.4 (*)    Albumin 2.4 (*)    All other components within normal limits  LIPASE, BLOOD - Abnormal; Notable for the following:    Lipase 72 (*)    All other components within normal limits  CBC WITH DIFFERENTIAL/PLATELET - Abnormal; Notable for the following:    RBC 3.37 (*)    Hemoglobin 9.2 (*)    HCT 28.4 (*)    Neutrophils Relative % 79 (*)    Lymphocytes Relative 11 (*)    All other components within normal limits  PROTIME-INR - Abnormal; Notable for the following:    Prothrombin Time 17.1 (*)    All other components within normal limits  URINALYSIS, ROUTINE W REFLEX MICROSCOPIC (NOT AT [facility]) - Abnormal; Notable for the following:    Color, Urine AMBER (*)    APPearance CLOUDY (*)    Hgb urine dipstick SMALL (*)    Protein, ur 30 (*)    All other components within normal limits  C-REACTIVE PROTEIN - Abnormal; Notable for the following:    CRP 9.9 (*)    All other components within normal limits  SEDIMENTATION RATE - Abnormal; Notable for the following:    Sed Rate 64 (*)    All other components within normal limits  URINE MICROSCOPIC-ADD ON - Abnormal; Notable for the following:    Casts GRANULAR CAST (*)    All other components within normal limits  IRON AND TIBC - Abnormal; Notable for the following:    Iron 8 (*)    TIBC 151 (*)    Saturation Ratios 5 (*)    All other components within normal limits  RETICULOCYTES - Abnormal; Notable for the following:    RBC. 3.24 (*)    All other components within normal limits  I-STAT  ARTERIAL BLOOD GAS, ED - Abnormal; Notable for the following:    pCO2 arterial 29.4 (*)    pO2, Arterial 62.0 (*)    Bicarbonate 18.2 (*)    Acid-base deficit 6.0 (*)    All other components within normal limits  CULTURE, BLOOD (ROUTINE X 2)  CULTURE, BLOOD (ROUTINE X 2)  URINE CULTURE  MRSA PCR SCREENING  HIV ANTIBODY (ROUTINE TESTING)  APTT  MAGNESIUM  TSH  URINE RAPID DRUG SCREEN, HOSP PERFORMED  VITAMIN B12  FOLATE  FERRITIN  PROCALCITONIN  ROCKY MTN SPOTTED FVR ABS PNL(IGG+IGM)  B. BURGDORFI ANTIBODIES  GI PATHOGEN PANEL BY PCR, STOOL  RPR  BASIC METABOLIC PANEL  CBC  I-STAT CG4 LACTIC  ACID, ED  POC OCCULT BLOOD, ED  I-STAT CG4 LACTIC ACID, ED  TYPE AND SCREEN  ABO/RH    Imaging Review Dg Chest 2 View  10/18/2014   CLINICAL DATA:  Fatigue, fever, and weakness for 3 weeks. History of prostate cancer.  EXAM: CHEST  2 VIEW  COMPARISON:  08/08/2008  FINDINGS: The heart size and mediastinal contours are within normal limits. Both lungs are clear. The visualized skeletal structures are unremarkable.  IMPRESSION: No active cardiopulmonary disease.   Electronically Signed   By: Lucienne Capers M.D.   On: 10/18/2014 00:49   I have personally reviewed and evaluated these images and lab results as part of my medical decision-making.   EKG Interpretation   Date/Time:  Tuesday October 17 2014 23:55:33 EDT Ventricular Rate:  102 PR Interval:  138 QRS Duration: 79 QT Interval:  313 QTC Calculation: 408 R Axis:   81 Text Interpretation:  Sinus tachycardia Atrial premature complexes  Probable left atrial enlargement Borderline right axis deviation Probable  LVH with secondary repol abnrm Nonspecific T wave abnormality inferior and  lateral leads Confirmed by HORTON  MD, COURTNEY ([id number]) on 10/17/2014  11:58:01 PM      MDM   Final diagnoses:  Restless leg syndrome  Prostate cancer  Acute encephalopathy    Filed Vitals:   10/18/14 1600  BP: 89/46  Pulse: 98  Temp:  98.9 F (37.2 C)  Resp: 27   Patient presenting with fever and h/o confusion.  No neurofocal deficits on examination. Petechiael rash noted on bilateral calves. Hypotensive despite 3 liters of IVF administration, but vitals otherwise stable.   Hyponatremic. No leukocytosis or leukopenia.   Concern for tick borne illness  Hospitalist consulted, requesting Critical Care administration.  Critical will be by to see the patient and determine ICU vs. Stepdown. Signed out to [employer], PA-C.  Patient d/w with Dr. Dina Rich, agrees with plan.      Baron Sane, PA-C 10/18/14 2046  Merryl Hacker, MD 10/19/14 6314  Merryl Hacker, MD 10/19/14 [phone]

## 2014-10-18 NOTE — H&P (Addendum)
[facility] History and Physical  Alan [name] FAO:[id number] DOB: 10-Feb-1951 DOA: 10/17/2014  Referring physician: ED physician PCP: [name], MD  Specialists:   Chief Complaint: Altered mental status, dizziness, rashes  HPI: Alan [name] is a 64 y.o. male with PMH of prostate cancer, RLS, who resents result in mental status, dizziness and the rashes.   Per patient's wife, pt just hasn't felt well lately. Patient has been having dizziness and poor balance in the past 2-3 weeks. No unilateral weakness, vision change or hearing loss. Patient recently did a lot of yard work in [employer], such as cutting trees. He could have had insect bite per his wife. Today patient become confused and lethargic, and was found asleep by security guard at work (works at [employer]) at about 9:30 PM. He was also found to have fever with temperature 101.2 and petechial rashes over bilateral legs. Patient does not have headache, neck stiffness, photophobia, nausea, vomiting, AP or diarrhea. He has mild cough, but no sputum production, chest pain or shortness breast. When I saw pt in Ed, his mental status improved and is oriented x 3.  In ED, patient was found to have hypotension with SBP ~ 80s which improve to 109/66 after 5L of NS bolus in ED. WBC 8.9, temperature 100.9, tachycardia, electrolytes okay. Chest x-ray is negative, lipase 72, negative urinalysis, lactate 0.75, negative FOBT, hemoglobin 9.2 which was 15.7 on 2010, CRP 9.9, ESR 64. Patient is admitted to inpatient for further evaluation and treatment. ED will consult to PCCM.  Where does patient live?   At home    Can patient participate in ADLs?  Yes    Review of Systems:   General: has fevers, chills, no changes in body weight, has poor appetite, has fatigue HEENT: no blurry vision, hearing changes or sore throat Pulm: no dyspnea, has coughing, no wheezing CV: no chest pain, palpitations Abd: no nausea,  vomiting, abdominal pain, diarrhea, constipation GU: no dysuria, burning on urination, increased urinary frequency, hematuria  Ext: no leg edema Neuro: no unilateral weakness, numbness, or tingling, no vision change or hearing loss. Has dizziness. Skin: has rash over legs. MSK: No muscle spasm, no deformity, no limitation of range of movement in spin Heme: No easy bruising.  Travel history: No recent long distant travel.  Allergy:  Allergies  Allergen Reactions  . Penicillins Swelling    Taste buds on tongue swell    Past Medical History  Diagnosis Date  . Restless leg syndrome   . Prostate cancer     Past Surgical History  Procedure Laterality Date  . Prostatectomy      Social History:  reports that he has quit smoking. He does not have any smokeless tobacco history on file. He reports that he does not drink alcohol or use illicit drugs.  Family History: No family history on file.   Prior to Admission medications   Medication Sig Start Date End Date Taking? Authorizing Provider  pramipexole (MIRAPEX) 1.5 MG tablet Take 1.5 mg by mouth at bedtime.   Yes Historical Provider, MD    Physical Exam: Filed Vitals:   10/18/14 0600 10/18/14 0630 10/18/14 0636 10/18/14 0718  BP: 95/55 120/61 120/61 122/67  Pulse: 78 85 83 90  Temp:      TempSrc:      Resp:   17 18  Height:      Weight:      SpO2: 96% 93% 92% 93%   General: Not in acute  distress HEENT:       Eyes: PERRL, EOMI, no scleral icterus.       ENT: No discharge from the ears and nose, no pharynx injection, no tonsillar enlargement.        Neck: No JVD, no bruit, no mass felt. Heme: No neck lymph node enlargement. Cardiac: S1/S2, RRR, No murmurs, No gallops or rubs. Pulm: No rales, wheezing, rhonchi or rubs. Abd: Soft, nondistended, nontender, no rebound pain, no organomegaly, BS present. Ext: No pitting leg edema bilaterally. 2+DP/PT pulse bilaterally. Musculoskeletal: No joint deformities, No joint redness or  warmth, no limitation of ROM in spin. Skin: petechial rashes in both legs. Neuro: Alert, oriented X3, cranial nerves II-XII grossly intact, muscle strength 5/5 in all extremities, sensation to light touch intact. Brachial reflex 2+ bilaterally. Knee reflex 1+ bilaterally. Negative Babinski's sign. Normal finger to nose test. No nuchal rigidity. negative Brudzinski sign and Kernig sign. Psych: Patient is not psychotic, no suicidal or hemocidal ideation.  Labs on Admission:  Basic Metabolic Panel:  Recent Labs Lab 10/18/14 0125  NA 129*  K 4.0  CL 100*  CO2 21*  GLUCOSE 118*  BUN 12  CREATININE 1.20  CALCIUM 8.3*   Liver Function Tests:  Recent Labs Lab 10/18/14 0125  AST 22  ALT 18  ALKPHOS 40  BILITOT 0.8  PROT 6.4*  ALBUMIN 2.4*    Recent Labs Lab 10/18/14 0125  LIPASE 72*   No results for input(s): AMMONIA in the last 168 hours. CBC:  Recent Labs Lab 10/18/14 0125  WBC 8.9  NEUTROABS 7.0  HGB 9.2*  HCT 28.4*  MCV 84.3  PLT 231   Cardiac Enzymes: No results for input(s): CKTOTAL, CKMB, CKMBINDEX, TROPONINI in the last 168 hours.  BNP (last 3 results) No results for input(s): BNP in the last 8760 hours.  ProBNP (last 3 results) No results for input(s): PROBNP in the last 8760 hours.  CBG: No results for input(s): GLUCAP in the last 168 hours.  Radiological Exams on Admission: Dg Chest 2 View  10/18/2014   CLINICAL DATA:  Fatigue, fever, and weakness for 3 weeks. History of prostate cancer.  EXAM: CHEST  2 VIEW  COMPARISON:  08/08/2008  FINDINGS: The heart size and mediastinal contours are within normal limits. Both lungs are clear. The visualized skeletal structures are unremarkable.  IMPRESSION: No active cardiopulmonary disease.   Electronically Signed   By: Lucienne Capers M.D.   On: 10/18/2014 00:49    EKG: Independently reviewed.  Abnormal findings:  LAE, mild T-wave inversion in inferior disease and V4 to V6   Assessment/Plan Principal  Problem:   Acute encephalopathy Active Problems:   Restless leg syndrome   Prostate cancer   Rash   Sepsis   Normocytic anemia   Dizziness  Acute encephalopathy: Etiology is not clear. Patient is septic on admission with hypotension, tachycardia and fever. Possible etiology is insect bite related disease, such as RMSF given his petechial rashes. Ed will consult to PCCM. -will admit to SUD -started IV doxycycline by ED -Check RMSF titer and B. Burgdorferi's titer -Frequent neuro check -May consult to ID in AM -UDS, HIV Ab, RPR  Sepsis: with hypotension, tachycardia and fever. Bp improved to 109/66 after 5L of normal saline bolus in ED -will get Procalcitonin and trend lactic acid levels per sepsis protocol. -IVF: 5L of NS bolus in ED, followed by 125-->100 cc/h -f/u blood and urine culture  Restless leg syndrome: -Pramipexole  Normocytic anemia: hgb 15.7 on  2010-->9.2. FOBT negative. -check anemia panel  Dizziness: Etiology is not clear, likely due to ongoing infection. -will get MRI-brain  DVT ppx: SQ Heparin   Code Status: Full code Family Communication:  Yes, patient's wife  at bed side Disposition Plan: Admit to inpatient   Date of Service 10/18/2014    Ivor Costa [facility] Pager [phone]  If 7PM-7AM, please contact night-coverage [email or web address] Password [email or web address] 10/18/2014, 7:20 AM

## 2014-10-18 NOTE — ED Notes (Signed)
Critical care PA at bedside.  

## 2014-10-18 NOTE — ED Notes (Signed)
Phlebotomy in room at this time.

## 2014-10-19 ENCOUNTER — Encounter (HOSPITAL_COMMUNITY): Payer: Self-pay | Admitting: Physician Assistant

## 2014-10-19 ENCOUNTER — Inpatient Hospital Stay (HOSPITAL_COMMUNITY): Payer: BC Managed Care – PPO

## 2014-10-19 DIAGNOSIS — R21 Rash and other nonspecific skin eruption: Secondary | ICD-10-CM

## 2014-10-19 DIAGNOSIS — I058 Other rheumatic mitral valve diseases: Secondary | ICD-10-CM | POA: Diagnosis present

## 2014-10-19 DIAGNOSIS — I4892 Unspecified atrial flutter: Secondary | ICD-10-CM | POA: Diagnosis present

## 2014-10-19 DIAGNOSIS — I480 Paroxysmal atrial fibrillation: Secondary | ICD-10-CM

## 2014-10-19 DIAGNOSIS — J9600 Acute respiratory failure, unspecified whether with hypoxia or hypercapnia: Secondary | ICD-10-CM | POA: Diagnosis present

## 2014-10-19 DIAGNOSIS — R7881 Bacteremia: Secondary | ICD-10-CM

## 2014-10-19 DIAGNOSIS — I059 Rheumatic mitral valve disease, unspecified: Secondary | ICD-10-CM

## 2014-10-19 DIAGNOSIS — I358 Other nonrheumatic aortic valve disorders: Secondary | ICD-10-CM

## 2014-10-19 DIAGNOSIS — B952 Enterococcus as the cause of diseases classified elsewhere: Secondary | ICD-10-CM

## 2014-10-19 HISTORY — DX: Enterococcus as the cause of diseases classified elsewhere: B95.2

## 2014-10-19 HISTORY — DX: Bacteremia: R78.81

## 2014-10-19 HISTORY — DX: Unspecified atrial flutter: I48.92

## 2014-10-19 HISTORY — DX: Other nonrheumatic aortic valve disorders: I35.8

## 2014-10-19 HISTORY — DX: Paroxysmal atrial fibrillation: I48.0

## 2014-10-19 HISTORY — DX: Rheumatic mitral valve disease, unspecified: I05.9

## 2014-10-19 LAB — CBC
HEMATOCRIT: 27.2 % — AB (ref 39.0–52.0)
HEMOGLOBIN: 8.8 g/dL — AB (ref 13.0–17.0)
MCH: 27.2 pg (ref 26.0–34.0)
MCHC: 32.4 g/dL (ref 30.0–36.0)
MCV: 84.2 fL (ref 78.0–100.0)
Platelets: 212 10*3/uL (ref 150–400)
RBC: 3.23 MIL/uL — ABNORMAL LOW (ref 4.22–5.81)
RDW: 15 % (ref 11.5–15.5)
WBC: 6.3 10*3/uL (ref 4.0–10.5)

## 2014-10-19 LAB — RPR: RPR: NONREACTIVE

## 2014-10-19 LAB — BASIC METABOLIC PANEL
ANION GAP: 6 (ref 5–15)
BUN: 10 mg/dL (ref 6–20)
CHLORIDE: 106 mmol/L (ref 101–111)
CO2: 22 mmol/L (ref 22–32)
Calcium: 8.4 mg/dL — ABNORMAL LOW (ref 8.9–10.3)
Creatinine, Ser: 0.94 mg/dL (ref 0.61–1.24)
GFR calc Af Amer: >60 mL/min (ref >60)
Glucose, Bld: 122 mg/dL — ABNORMAL HIGH (ref 65–99)
POTASSIUM: 3.7 mmol/L (ref 3.5–5.1)
SODIUM: 134 mmol/L — AB (ref 135–145)

## 2014-10-19 LAB — B. BURGDORFI ANTIBODIES: B burgdorferi Ab IgG+IgM: <0.91 {ISR} (ref 0.00–0.90)

## 2014-10-19 LAB — GLUCOSE, CAPILLARY: Glucose-Capillary: 109 mg/dL — ABNORMAL HIGH (ref 65–99)

## 2014-10-19 MED ORDER — DOXYCYCLINE HYCLATE 100 MG IV SOLR
100.0000 mg | Freq: Two times a day (BID) | INTRAVENOUS | Status: DC
  Administered 2014-10-19: 100 mg via INTRAVENOUS
  Filled 2014-10-19 (×2): qty 100

## 2014-10-19 MED ORDER — VANCOMYCIN HCL IN DEXTROSE 1-5 GM/200ML-% IV SOLN
1000.0000 mg | Freq: Two times a day (BID) | INTRAVENOUS | Status: DC
  Administered 2014-10-19 – 2014-10-22 (×6): 1000 mg via INTRAVENOUS
  Filled 2014-10-19 (×7): qty 200

## 2014-10-19 MED ORDER — SODIUM CHLORIDE 0.9 % IV SOLN
1500.0000 mg | Freq: Once | INTRAVENOUS | Status: AC
  Administered 2014-10-19: 1500 mg via INTRAVENOUS
  Filled 2014-10-19: qty 1500

## 2014-10-19 NOTE — Progress Notes (Addendum)
    [facility] has been requested to perform a transesophageal echocardiogram on Mr. Ranganathan for abnormal 2D echo with masses possibly concerning for vegetations.  After careful review of history and examination, the risks and benefits of transesophageal echocardiogram have been explained including risks of esophageal damage, perforation (1:10,000 risk), bleeding, pharyngeal hematoma as well as other potential complications associated with conscious sedation including aspiration, arrhythmia, respiratory failure and death. Alternatives to treatment were discussed, questions were answered. Patient was totally alert and oriented during our discussion. Denies h/o esophageal issues. Patient is willing to proceed. Scheduled for tomorrow @ noon with Dr. Johnsie Cancel.  Melina Copa, PA-C 10/19/2014 3:57 PM

## 2014-10-19 NOTE — Progress Notes (Signed)
Dr. Eliseo Squires was notified of the cardiac rhythm changes from SR to A-fib.  As of this writing, patient's heart rate is currently on Sinus Rhythm.

## 2014-10-19 NOTE — Evaluation (Signed)
Physical Therapy Evaluation Patient Details Name: Alan [name] MRN: [id number] DOB: 1950-04-29 Today's Date: 10/19/2014   History of Present Illness  64 y.o. male with PMH of prostate cancer, RLS, who resents result in mental status, dizziness and rashes. Patient has been having dizziness and poor balance in the past 2-3 weeks. No unilateral weakness, vision change or hearing loss. Patient recently did a lot of yard work in [place], such as cutting trees. He could have had insect bite per his wife. Patient became confused and lethargic, and was found asleep by security guard at work (works at [employer]) at about 9:30 PM. He was also found to have fever with temperature 101.2 and petechial rashes over bilateral legs. Currently presents with acute encephalopathy.  Clinical Impression  Patient in bed, agreeable to participate in PT today. Eval was limited due to rapid HR response - patient went into AFib during interview. HR peaked at ~170 bpm, settled between 120-140 bpm thereafter. RN entered room and helped monitor patient, cleared him for PROM in the bed. Patient had no c/o heart racing, lightheadedness or SOB. He did c/o shoulders feeling "out of place" and severely decreased ROM and strength in Bil UE, however, he was unable to state why or how this might have occurred. See notes below. He remembers being very confused and disoriented upon hospital admission, reports feeling much better in that regard as of late. Due to HR, further eval was deferred. Patient will benefit from continued PT once HR normalizes to evaluate and progress mobility. Will check back tomorrow.     Follow Up Recommendations Other (comment) (TBA as HR response becomes appropriate for PT.)    Equipment Recommendations  Other (comment) (TBA)    Recommendations for Other Services OT consult     Precautions / Restrictions Precautions Precautions: None Restrictions Weight Bearing Restrictions: No       Mobility  Bed Mobility               General bed mobility comments: Bed mobility deferred due to AFib and elevated HR.  Transfers                    Ambulation/Gait                Stairs            Wheelchair Mobility    Modified Rankin (Stroke Patients Only)       Balance                                             Pertinent Vitals/Pain Pain Assessment: Faces Faces Pain Scale: Hurts whole lot Pain Location: Bil shoulders Pain Descriptors / Indicators: Grimacing;Guarding;Contraction Pain Intervention(s): Limited activity within patient's tolerance;Monitored during session;Relaxation    Home Living Family/patient expects to be discharged to:: Private residence Living Arrangements: Spouse/significant other Available Help at Discharge: Family;Available PRN/intermittently Type of Home: House Home Access: Level entry     Home Layout: One level Home Equipment: None      Prior Function Level of Independence: Independent               Hand Dominance   Dominant Hand: Right    Extremity/Trunk Assessment   Upper Extremity Assessment: RUE deficits/detail;LUE deficits/detail RUE Deficits / Details: Severe weakness and pain in R shoulder, especially during ABduction and shoulder flexion. Patient  cannot actively move shoulder. Minimal active elbow flexion. Limited to about 10 deg ABd and 30 deg flexion passively. Patient reports his shoulder feels "out of place." Palpation revealed severely anterior humeral head position. Pec major cramped significantly upon lowering of shoulder from end-range shoulder flexion.     LUE Deficits / Details: Painful, limited shoulder flexion PROM about 60 deg. Full elbow flexion but weak.   Lower Extremity Assessment: Overall WFL for tasks assessed (Strong, mobile Bil LE.)         Communication   Communication: No difficulties  Cognition Arousal/Alertness: Awake/alert Behavior  During Therapy: WFL for tasks assessed/performed Overall Cognitive Status: Impaired/Different from baseline Area of Impairment: Orientation Orientation Level: Disoriented to;Time (Date and weekday. Oriented to month and year.)             General Comments: Patient reports feeling very confused before admission to hospital, but he is now fully oriented after being oriented to the weekday and date. Says he is feeling significantly better.    General Comments      Exercises General Exercises - Lower Extremity Ankle Circles/Pumps: AROM;20 reps;Supine;Both Straight Leg Raises: AROM;Both;Supine;10 reps Hip Flexion/Marching: AROM;Both;Supine;10 reps      Assessment/Plan    PT Assessment Patient needs continued PT services  PT Diagnosis Generalized weakness   PT Problem List Decreased strength;Decreased range of motion;Decreased activity tolerance;Decreased mobility;Cardiopulmonary status limiting activity  PT Treatment Interventions Gait training;Functional mobility training;Therapeutic activities;Therapeutic exercise;Balance training;Patient/family education   PT Goals (Current goals can be found in the Care Plan section) Acute Rehab PT Goals Patient Stated Goal: Return to work. PT Goal Formulation: With patient Time For Goal Achievement: 11/02/14 Potential to Achieve Goals: Good    Frequency Min 3X/week   Barriers to discharge Decreased caregiver support Wife works 7 days/week at [employer].    Co-evaluation               End of Session   Activity Tolerance: Treatment limited secondary to medical complications (Comment) (AFib began during session, elevated HR.) Patient left: in bed;with call bell/phone within reach;with bed alarm set Nurse Communication: Mobility status (RN entered room due to rapid HR elevation.)         Time: 2902-1115 PT Time Calculation (min) (ACUTE ONLY): 32 min   Charges:   PT Evaluation $Initial PT Evaluation Tier I: 1  Procedure PT Treatments $Therapeutic Exercise: 8-22 mins   PT G CodesRoanna Epley, SPT [phone] 10/19/2014, 1:34 PM  I have read, reviewed and agree with student's note.   [place] [phone] (pager)

## 2014-10-19 NOTE — Progress Notes (Signed)
OT Cancellation Note  Patient Details Name: Alan [name] MRN: [id number] DOB: 11-14-1950   Cancelled Treatment:    Reason Eval/Treat Not Completed: Medical issues which prohibited therapy. Pt with increased HR/afib without exertion earlier today. Will continue to follow.  [name] 10/19/2014, 3:56 PM  [phone]

## 2014-10-19 NOTE — Progress Notes (Signed)
PROGRESS NOTE  Alan [name] KDT:[id number] DOB: 31-May-1950 DOA: 10/17/2014 PCP: [name], MD  Alan [name] is a 64 y.o. male with PMH of prostate cancer, RLS, who resents result in mental status, dizziness and a rash. Per patient's wife, pt just hasn't felt well lately. Patient has been having dizziness and poor balance in the past 2-3 weeks. No unilateral weakness, vision change or hearing loss. Patient recently did a lot of yard work in [place], such as cutting trees. He could have had insect bite per his wife. Today patient become confused and lethargic, and was found asleep by security guard at work (works at [employer]) at about 9:30 PM.    Assessment/Plan: Acute encephalopathy:  Gram + cocci in pairs 2/2 blood cultures -started IV doxycycline by ED- add  IV vanc -- ? RMSF work up in process: RMSF titer and B. Burgdorferi's titer -UDS, HIV Ab, RPR normal  Acute resp failure - on 4 L currently, not on any O2 at home  Bacteremia - await final cultures  Diarrhea -GI pathogen panel pending  Sepsis: with hypotension, tachycardia and fever. Bp improved to 109/66 after 5L of normal saline bolus in ED -pro calcitonin 0.99 -IVF: 5L of NS bolus in ED, followed by 125-->100 cc/h -+ blood cultures  Restless leg syndrome: -Pramipexole  Normocytic anemia: hgb 15.7 on 2010-->9.2. FOBT negative. -check anemia panel  Dizziness: Etiology is not clear, likely due to ongoing infection. Monitor and if not improved, will get MRI of brain  Code Status: full Family Communication: patient/ called wife Disposition Plan: remain in SDU   Consultants:    Procedures:     HPI/Subjective: Feeling better, some SOB  Objective: Filed Vitals:   10/19/14 0600  BP: 113/61  Pulse: 81  Temp:   Resp: 26    Intake/Output Summary (Last 24 hours) at 10/19/14 0745 Last data filed at 10/19/14 0636  Gross per 24 hour  Intake   2273 ml  Output   1700 ml    Net    573 ml   Filed Weights   10/18/14 0012 10/18/14 1241  Weight: 74.844 kg (165 lb) 75.161 kg (165 lb 11.2 oz)    Exam:   General:  Awake, NAD, A+Ox3  Cardiovascular: rrr  Respiratory: clear  Abdomen: +BS, soft  Musculoskeletal: no edema, mild rash   Data Reviewed: Basic Metabolic Panel:  Recent Labs Lab 10/18/14 0125 10/18/14 1008 10/19/14 0310  NA 129*  --  134*  K 4.0  --  3.7  CL 100*  --  106  CO2 21*  --  22  GLUCOSE 118*  --  122*  BUN 12  --  10  CREATININE 1.20  --  0.94  CALCIUM 8.3*  --  8.4*  MG  --  1.7  --    Liver Function Tests:  Recent Labs Lab 10/18/14 0125  AST 22  ALT 18  ALKPHOS 40  BILITOT 0.8  PROT 6.4*  ALBUMIN 2.4*    Recent Labs Lab 10/18/14 0125  LIPASE 72*   No results for input(s): AMMONIA in the last 168 hours. CBC:  Recent Labs Lab 10/18/14 0125 10/19/14 0310  WBC 8.9 6.3  NEUTROABS 7.0  --   HGB 9.2* 8.8*  HCT 28.4* 27.2*  MCV 84.3 84.2  PLT 231 212   Cardiac Enzymes: No results for input(s): CKTOTAL, CKMB, CKMBINDEX, TROPONINI in the last 168 hours. BNP (last 3 results) No results for input(s): BNP in the last  8760 hours.  ProBNP (last 3 results) No results for input(s): PROBNP in the last 8760 hours.  CBG: No results for input(s): GLUCAP in the last 168 hours.  Recent Results (from the past 240 hour(s))  Blood Culture (routine x 2)     Status: None (Preliminary result)   Collection Time: 10/18/14  1:11 AM  Result Value Ref Range Status   Specimen Description BLOOD RIGHT ANTECUBITAL  Final   Special Requests BOTTLES DRAWN AEROBIC AND ANAEROBIC 10CC  Final   Culture  Setup Time   Final    GRAM POSITIVE COCCI IN PAIRS IN BOTH AEROBIC AND ANAEROBIC BOTTLES CRITICAL RESULT CALLED TO, READ BACK BY AND VERIFIED WITH: P CARBORNE 10/18/14 @ 72 M VESTAL    Culture GRAM POSITIVE COCCI  Final   Report Status PENDING  Incomplete  Blood Culture (routine x 2)     Status: None (Preliminary result)    Collection Time: 10/18/14  1:19 AM  Result Value Ref Range Status   Specimen Description BLOOD RIGHT WRIST  Final   Special Requests BOTTLES DRAWN AEROBIC AND ANAEROBIC 10CC  Final   Culture  Setup Time   Final    GRAM POSITIVE COCCI IN PAIRS IN BOTH AEROBIC AND ANAEROBIC BOTTLES CRITICAL RESULT CALLED TO, READ BACK BY AND VERIFIED WITH: P CARBORNE 10/18/14 @ 18 M VESTAL    Culture GRAM POSITIVE COCCI  Final   Report Status PENDING  Incomplete  MRSA PCR Screening     Status: None   Collection Time: 10/18/14  5:58 PM  Result Value Ref Range Status   MRSA by PCR NEGATIVE NEGATIVE Final    Comment:        The GeneXpert MRSA Assay (FDA approved for NASAL specimens only), is one component of a comprehensive MRSA colonization surveillance program. It is not intended to diagnose MRSA infection nor to guide or monitor treatment for MRSA infections.      Studies: Dg Chest 2 View  10/18/2014   CLINICAL DATA:  Fatigue, fever, and weakness for 3 weeks. History of prostate cancer.  EXAM: CHEST  2 VIEW  COMPARISON:  08/08/2008  FINDINGS: The heart size and mediastinal contours are within normal limits. Both lungs are clear. The visualized skeletal structures are unremarkable.  IMPRESSION: No active cardiopulmonary disease.   Electronically Signed   By: Lucienne Capers M.D.   On: 10/18/2014 00:49    Scheduled Meds: . cefTRIAXone (ROCEPHIN)  IV  2 g Intravenous Q24H  . doxycycline (VIBRAMYCIN) IV  100 mg Intravenous Q12H  . heparin  5,000 Units Subcutaneous 3 times per day  . metronidazole  500 mg Intravenous Q8H  . pramipexole  1.5 mg Oral QHS  . sodium chloride  3 mL Intravenous Q12H   Continuous Infusions: . sodium chloride 100 mL/hr at 10/19/14 0144   Antibiotics Given (last 72 hours)    Date/Time Action Medication Dose Rate   10/18/14 1700 Given   cefTRIAXone (ROCEPHIN) 2 g in dextrose 5 % 50 mL IVPB 2 g 100 mL/hr   10/18/14 1728 Given   doxycycline (VIBRAMYCIN) 100 mg in  dextrose 5 % 250 mL IVPB 100 mg 125 mL/hr   10/19/14 0549 Given   doxycycline (VIBRAMYCIN) 100 mg in dextrose 5 % 250 mL IVPB 100 mg 125 mL/hr      Principal Problem:   Acute encephalopathy Active Problems:   Restless leg syndrome   Prostate cancer   Rash   Sepsis   Normocytic anemia   Dizziness  Time spent: 35 min    Elijah Michaelis  [facility] Pager [phone]. If 7PM-7AM, please contact night-coverage at [email or web address], password [facility] 10/19/2014, 7:45 AM  LOS: 1 day

## 2014-10-19 NOTE — Progress Notes (Signed)
[facility] for Infectious Disease    Date of Admission:  10/17/2014   Total days of antibiotics 2        Day 1 Vancomycin          Reason for Consult: automatic consultation for Enterococcal endocarditis and aortic and mitral valves     Patient Active Problem List   Diagnosis Date Noted  . Enterococcal bacteremia 10/19/2014    Priority: High  . 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 Acute respiratory failure 10/19/2014  . Acute encephalopathy 10/18/2014  . Rash 10/18/2014  . Sepsis 10/18/2014  . Normocytic anemia 10/18/2014  . Dizziness 10/18/2014  . Restless leg syndrome   . Prostate cancer     . heparin  5,000 Units Subcutaneous 3 times per day  . pramipexole  1.5 mg Oral QHS  . sodium chloride  3 mL Intravenous Q12H  . vancomycin  1,000 mg Intravenous Q12H    Recommendations: 1. Continue Vancomycin alone pending final antibiotic susceptibility results and further information about his penicillin allergy 2. Agree with TEE 3. 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 He has a history of penicillin allergy that is not completely clear. I will continue vancomycin alone tonight pending antibiotic susceptibilities and hopefully more information about what the reaction to penicillin was. Ideally he needs to drug therapy. We will followup in the morning.  HPI: Alan [name] is a 64 y.o. male admitted 10/17/2014 with altered mental status, dizziness, and bilateral rashes on his legs. His wife also mentioned that he had the potential for an insect bite, with [place]. His wife reports that he had not been feeling well lately and was found asleep at work by a security guard with a temperature of 101.2.   Upon presentation he was found to be  hypotensive, tacycardiac, and WBC 8.9. He was initiated on IV doxycycline in the ED for suspected RMSF. Yesterday 8/24, Alan [name] had 3 watery stools and nausea. A GI panel was obtained and is currently pending, he was started on cipro and metronidazole.   Blood cultures were obtained and are now growing 2/2 Enterococcus sp., Vancomycin IV was initiated. He has been afebrile for the past 24h, WBC 6.3.  Dimitri Ped, PharmD. Clinical Pharmacist Resident Pager: [phone]  Addendum: I examined Alan [name] with our infectious disease pharmacist, Dimitri Ped. Alan [name] describes at least a six-week illness where he was feeling progressively weaker, having back spasms, shoulder pain, anorexia, unintentional weight loss and progressive confusion. He was admitted and found to have enterococcal bacteremia. Todays TTE reveals large mobile vegetations on both the aortic and mitral valves.  Review of Systems: Review of systems not obtained due to patient factors.  Past Medical History  Diagnosis Date  . Restless leg syndrome   . Prostate cancer     Social History  Substance Use Topics  . Smoking status: Former Research scientist (life sciences)  . Smokeless tobacco: None  . Alcohol Use: No    No family history on file. Allergies  Allergen Reactions  . Penicillins Swelling    Taste buds on tongue swell    OBJECTIVE: Blood pressure 112/75, pulse 87, temperature 97.5 F (36.4 C), temperature source Oral, resp. rate 19, height 6' (1.829 m), weight 165 lb 11.2 oz (  75.161 kg), SpO2 96 %. General: he is alert and in no distress but admits that he remains confused Skin: perhaps some faint petechia around ankles. No splinter or conjunctival hemorrhages Lungs: clear Cor: regular S1 and S2 with no murmur heard Abdomen: soft and nontender with no palpable masses Extremities: Some pain with range of motion of his shoulders but no redness, warmth or swelling  Lab Results Lab Results  Component Value  Date   WBC 6.3 10/19/2014   HGB 8.8* 10/19/2014   HCT 27.2* 10/19/2014   MCV 84.2 10/19/2014   PLT 212 10/19/2014    Lab Results  Component Value Date   CREATININE 0.94 10/19/2014   BUN 10 10/19/2014   NA 134* 10/19/2014   K 3.7 10/19/2014   CL 106 10/19/2014   CO2 22 10/19/2014    Lab Results  Component Value Date   ALT 18 10/18/2014   AST 22 10/18/2014   ALKPHOS 40 10/18/2014   BILITOT 0.8 10/18/2014     Microbiology: Recent Results (from the past 240 hour(s))  Blood Culture (routine x 2)     Status: None (Preliminary result)   Collection Time: 10/18/14  1:11 AM  Result Value Ref Range Status   Specimen Description BLOOD RIGHT ANTECUBITAL  Final   Special Requests BOTTLES DRAWN AEROBIC AND ANAEROBIC 10CC  Final   Culture  Setup Time   Final    GRAM POSITIVE COCCI IN PAIRS IN BOTH AEROBIC AND ANAEROBIC BOTTLES CRITICAL RESULT CALLED TO, READ BACK BY AND VERIFIED WITH: P CARBORNE 10/18/14 @ 75 M VESTAL    Culture ENTEROCOCCUS SPECIES  Final   Report Status PENDING  Incomplete  Blood Culture (routine x 2)     Status: None (Preliminary result)   Collection Time: 10/18/14  1:19 AM  Result Value Ref Range Status   Specimen Description BLOOD RIGHT WRIST  Final   Special Requests BOTTLES DRAWN AEROBIC AND ANAEROBIC 10CC  Final   Culture  Setup Time   Final    GRAM POSITIVE COCCI IN PAIRS IN BOTH AEROBIC AND ANAEROBIC BOTTLES CRITICAL RESULT CALLED TO, READ BACK BY AND VERIFIED WITH: P CARBORNE 10/18/14 @ 2 M VESTAL    Culture ENTEROCOCCUS SPECIES  Final   Report Status PENDING  Incomplete  Urine culture     Status: None (Preliminary result)   Collection Time: 10/18/14  2:56 AM  Result Value Ref Range Status   Specimen Description URINE, CATHETERIZED  Final   Special Requests NONE  Final   Culture CULTURE REINCUBATED FOR BETTER GROWTH  Final   Report Status PENDING  Incomplete  MRSA PCR Screening     Status: None   Collection Time: 10/18/14  5:58 PM  Result  Value Ref Range Status   MRSA by PCR NEGATIVE NEGATIVE Final    Comment:        The GeneXpert MRSA Assay (FDA approved for NASAL specimens only), is one component of a comprehensive MRSA colonization surveillance program. It is not intended to diagnose MRSA infection nor to guide or monitor treatment for MRSA infections.

## 2014-10-19 NOTE — Progress Notes (Signed)
ANTIBIOTIC CONSULT NOTE - INITIAL  Pharmacy Consult for vancomycin Indication: Bacteremia  Allergies  Allergen Reactions  . Penicillins Swelling    Taste buds on tongue swell    Patient Measurements: Height: 6' (182.9 cm) Weight: 165 lb 11.2 oz (75.161 kg) IBW/kg (Calculated) : 77.6  Vital Signs: Temp: 97.5 F (36.4 C) (08/25 0813) Temp Source: Oral (08/25 0813) BP: 112/75 mmHg (08/25 0813) Pulse Rate: 87 (08/25 0813) Intake/Output from previous day: 08/24 0701 - 08/25 0700 In: 2273 [P.O.:120; I.V.:1403; IV Piggyback:750] Out: 1700 [Urine:1700] Intake/Output from this shift: Total I/O In: -  Out: 150 [Urine:150]  Labs:  Recent Labs  10/18/14 0125 10/19/14 0310  WBC 8.9 6.3  HGB 9.2* 8.8*  PLT 231 212  CREATININE 1.20 0.94   Estimated Creatinine Clearance: 82.2 mL/min (by C-G formula based on Cr of 0.94). No results for input(s): VANCOTROUGH, VANCOPEAK, VANCORANDOM, GENTTROUGH, GENTPEAK, GENTRANDOM, TOBRATROUGH, TOBRAPEAK, TOBRARND, AMIKACINPEAK, AMIKACINTROU, AMIKACIN in the last 72 hours.   Microbiology: Recent Results (from the past 720 hour(s))  Blood Culture (routine x 2)     Status: None (Preliminary result)   Collection Time: 10/18/14  1:11 AM  Result Value Ref Range Status   Specimen Description BLOOD RIGHT ANTECUBITAL  Final   Special Requests BOTTLES DRAWN AEROBIC AND ANAEROBIC 10CC  Final   Culture  Setup Time   Final    GRAM POSITIVE COCCI IN PAIRS IN BOTH AEROBIC AND ANAEROBIC BOTTLES CRITICAL RESULT CALLED TO, READ BACK BY AND VERIFIED WITH: P CARBORNE 10/18/14 @ 70 M VESTAL    Culture GRAM POSITIVE COCCI  Final   Report Status PENDING  Incomplete  Blood Culture (routine x 2)     Status: None (Preliminary result)   Collection Time: 10/18/14  1:19 AM  Result Value Ref Range Status   Specimen Description BLOOD RIGHT WRIST  Final   Special Requests BOTTLES DRAWN AEROBIC AND ANAEROBIC 10CC  Final   Culture  Setup Time   Final    GRAM POSITIVE  COCCI IN PAIRS IN BOTH AEROBIC AND ANAEROBIC BOTTLES CRITICAL RESULT CALLED TO, READ BACK BY AND VERIFIED WITH: P CARBORNE 10/18/14 @ 19 M VESTAL    Culture GRAM POSITIVE COCCI  Final   Report Status PENDING  Incomplete  MRSA PCR Screening     Status: None   Collection Time: 10/18/14  5:58 PM  Result Value Ref Range Status   MRSA by PCR NEGATIVE NEGATIVE Final    Comment:        The GeneXpert MRSA Assay (FDA approved for NASAL specimens only), is one component of a comprehensive MRSA colonization surveillance program. It is not intended to diagnose MRSA infection nor to guide or monitor treatment for MRSA infections.     Medical History: Past Medical History  Diagnosis Date  . Restless leg syndrome   . Prostate cancer    Assessment: 64 yo M presents on 8/24 with AMS, dizziness, and rashes. Pt's wife reports he hasn't been feeling well lately. Also found to have a fever of 101.2. Started on cipro, flagyl, and doxy. Now found to have 2/2 positive blood cx's with GPC in pairs. Pharmacy consulted to start vancomycin. Afebrile, WBC wnl. SCr 0.94, CrCl ~11ml/min.  Goal of Therapy:  Vancomycin trough level 15-20 mcg/ml  Resolution of infection  Plan:  Give vancomycin 1500mg  IV x 1, then start 1g IV Q12 Monitor clinical picture, renal function, VT prn F/U C&S, abx deescalation / LOT  Fletcher Rathbun J 10/19/2014,8:21 AM

## 2014-10-19 NOTE — Consult Note (Signed)
Cardiology Consultation Note  Patient ID: Alan [name], MRN: [id number], DOB/AGE: 66/20/1952 66 y.o. Admit date: 10/17/2014   Date of Consult: 10/19/2014 Primary Physician: [name], MD Primary Cardiologist: New to Dr. Sallyanne Kuster  Chief Complaint: fatigue, altered mental status Reason for Consultation: tachycardia including newly recognized PAF  HPI: Alan [name] is a 64 y/o M with history of RLS, prostate CA, remote tobacco/alcohol use who presented to [facility] 10/17/2014 with altered mental status, dizziness, and rash. For the past 2-3 weeks he's been haing a lot of dizziness and poor balance as well as poor appetite and resultant 20lb weight loss. On the evening of ER presentation, the patient became confused that he had a work order waiting for him. He thought it was morning so he drove to work where he was found to be asleep by a security guard at [employer] (works at [employer]). He was also found to have fever 101.2 and petechial rashes over bilateral legs. By the time he was seen in the ER, mental status had improved. In ED, patient was found to have hypotension with SBP ~ 80s which improve to 109/66 after 5L of NS bolus in ED. WBC 8.9, Hgb 9.2->8.8 (last 15.7 in 2010), Na 129, Lipase 72, albumin 2.4, glucose 118, CRP 9.9, lactic acid WNL, FOBT negative. Further workup revealed positive blood cultures (GPC in pairs - enterococcus) and abnormal echocardiogram concerning for vegetations. Due to the patient working outside and concern for tickborne illness, RMSF and Lyme titers are pending. Other workup includes negative HIV, RPR, normal TSH. On telemetry today he was found to have paroxysms of tachycardia which appear to be both definite PAF and a separate paroxysmal atrial flutter. This was uncovered during our assessment when we were called to arrange TEE. He is currently in NSR. He denies any CP, recent SOB (reports dyspnea during a hike 2 years ago), LEE,  orthopnea, palpitations, syncope, BRBPR, melena or known bleeding source. He is on vancomycin. ID has been asked to consult by internal medicine. He developed diarrhea last night and is on enteric precautions while C diff is ruled out.   Past Medical History  Diagnosis Date  . Restless leg syndrome   . Prostate cancer       Most Recent Cardiac Studies: 2D Echo 10/19/14 - Left ventricle: Systolic function was vigorous. The estimated ejection fraction was in the range of 65% to 70%. - Aortic valve: Mass attached to ventricular side of noncoronary cusp Measures 11 x 8 mm There appeas to be aonther mass associated with R coronary cusp (not well imaged). Moderate aortic insufficiency. There was mild regurgitation. - Mitral valve: Mass attached to atrial side of anterior mitral leaflet 10 x 6 mm. Consistent with vegetation. Mild MR. - Left atrium: The atrium was mildly dilated. - Right atrium: The atrium was mildly dilated. Impressions: - Recommend TEE to further evaluate masses Short burst of tachycardia during exam.   Surgical History:  Past Surgical History  Procedure Laterality Date  . Prostatectomy       Home Meds: Prior to Admission medications   Medication Sig Start Date End Date Taking? Authorizing Provider  pramipexole (MIRAPEX) 1.5 MG tablet Take 1.5 mg by mouth at bedtime.   Yes Historical Provider, MD    Inpatient Medications:  . heparin  5,000 Units Subcutaneous 3 times per day  . pramipexole  1.5 mg Oral QHS  . sodium chloride  3 mL Intravenous Q12H  . vancomycin  1,000 mg  Intravenous Q12H   . sodium chloride 100 mL/hr at 10/19/14 0144    Allergies:  Allergies  Allergen Reactions  . Penicillins Swelling    Taste buds on tongue swell    Social History   Social History  . Marital Status: Married    Spouse Name: N/A  . Number of Children: N/A  . Years of Education: N/A   Occupational History  . Not on file.   Social History Main Topics    . Smoking status: Former Research scientist (life sciences)  . Smokeless tobacco: Not on file     Comment: Smoked a pipe age 36->28  . Alcohol Use: No     Comment: Drank from age 69->38  . Drug Use: No  . Sexual Activity: Not on file   Other Topics Concern  . Not on file   Social History Narrative     Family History  Problem Relation Age of Onset  . CAD Neg Hx      Review of Systems: No h/o syncope or bleeding. All other systems reviewed and are otherwise negative except as noted above.  Labs:  Lab Results  Component Value Date   WBC 6.3 10/19/2014   HGB 8.8* 10/19/2014   HCT 27.2* 10/19/2014   MCV 84.2 10/19/2014   PLT 212 10/19/2014    Recent Labs Lab 10/18/14 0125 10/19/14 0310  NA 129* 134*  K 4.0 3.7  CL 100* 106  CO2 21* 22  BUN 12 10  CREATININE 1.20 0.94  CALCIUM 8.3* 8.4*  PROT 6.4*  --   BILITOT 0.8  --   ALKPHOS 40  --   ALT 18  --   AST 22  --   GLUCOSE 118* 122*   Radiology/Studies:  Dg Chest 2 View  10/18/2014   CLINICAL DATA:  Fatigue, fever, and weakness for 3 weeks. History of prostate cancer.  EXAM: CHEST  2 VIEW  COMPARISON:  08/08/2008  FINDINGS: The heart size and mediastinal contours are within normal limits. Both lungs are clear. The visualized skeletal structures are unremarkable.  IMPRESSION: No active cardiopulmonary disease.   Electronically Signed   By: Lucienne Capers M.D.   On: 10/18/2014 00:49    Wt Readings from Last 3 Encounters:  10/18/14 165 lb 11.2 oz (75.161 kg)    EKG: NSR 95bpm with PACs, nonspecific TWI inferiorly, flattening V4-V6  Physical Exam: Blood pressure 117/63, pulse 94, temperature 97.7 F (36.5 C), temperature source Oral, resp. rate 21, height 6' (1.829 m), weight 165 lb 11.2 oz (75.161 kg), SpO2 95 %. General: Well developed, well nourished, in no acute distress. Head: Normocephalic, atraumatic, sclera non-icteric, no xanthomas, nares are without discharge.  Neck: Negative for carotid bruits. JVD not elevated. Lungs: Clear  bilaterally to auscultation without wheezes, rales, or rhonchi. Breathing is unlabored. Heart: RRR with S1 S2. No murmurs, rubs, or gallops appreciated. Abdomen: Soft, non-tender, non-distended with normoactive bowel sounds. No hepatomegaly. No rebound/guarding. No obvious abdominal masses. Msk:  Strength and tone appear normal for age. Extremities: No clubbing or cyanosis. No edema.  Distal pedal pulses are 2+ and equal bilaterally. Suggestion of faint petechia around ankles on LE. No splinter hemorrhages or Osler nodes. Neuro: Alert and oriented X 3. No facial asymmetry. No focal deficit. Moves all extremities spontaneously. Psych:  Responds to questions appropriately with a normal affect.    Assessment and Plan:   1. Acute encephalopathy and fever with + enterococcus by 2/2 blood cultures and probable endocarditis 2. Paroxysmal atrial fibrillation and  paroxysmal atrial flutter, newly recognized 3. Anemia of unknown chronicity, possibly related to above process 4. Hyponatremia, improved 5. Recent 20lb weight loss likely due to the above 6. 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 ID is on board, currently treating with vancomycin. TEE scheduled for tomorrow as previously discussed. Regarding findings of PAF/atrial flutter, it is not known in the past if he has ever had this before - this is the first documentation of such. He has been asymptomatic with this. BP a little soft at present time so will not start AVN blocking agent - PAF/PAFL may be due to acute medical illness but we will follow on telemetry.Thyroid function is normal. CHADSVASC is currently only 1 for age >50 so he does not currently require anticoagulation. However, he does have mild hyperglycemia thus will check A1C to risk stratify. Per discussion with Dr. Sallyanne Kuster, the patient should have MRA of the head to exclude mycotic aneurysms given 2-3 weeks of symptoms. If  his CHADSVASC is found to be 2 or greater after A1C results, the MRA would need to be done prior to clearing for anticoagulation. We will f/u in AM.  Signed, Dayna, Dunn PA-C 10/19/2014, 4:32 PM Pager: [phone]  I have seen and examined the patient along with Melina Copa PA-C.  I have reviewed the chart, notes and new data.  I agree with PA's note.  Key new complaints: typical symptoms of "slow" bacterial endocarditis, explaining weight loss, anemia, et. Encephalopathy and dizziness likely also related, but raise concern for secondary complications such as cerebral abscess or mycotic aneurysms Key examination changes: 1/6 MR murmur, cannot hear diastolic murmur Key new findings / data: echo with vegetations on both the aortic valve and anterior mitral leaflet by TTE, normal EF. Recurrent episodes of atrial flutter with 2:1 AV block 145 bpm, less often atrial fibrillation with RVR 150s.  PLAN: TEE to look for other complications such as annular abscess. MRA for possible mycotic aneurysms Will need GI w\u for source of bacteremia. Low embolic risk - hold off anticoagulation for now. Rate control meds  Sanda Klein, MD, [facility] [phone] 10/19/2014, 6:28 PM

## 2014-10-19 NOTE — Progress Notes (Signed)
TTE + for vegitation on valves-- called for TEE-- ID to see patient  Alan [name]

## 2014-10-19 NOTE — Progress Notes (Signed)
Echocardiogram 2D Echocardiogram has been performed.  Alan [name] 10/19/2014, 2:03 PM

## 2014-10-20 ENCOUNTER — Inpatient Hospital Stay (HOSPITAL_COMMUNITY)
Admit: 2014-10-20 | Discharge: 2014-10-20 | Disposition: A | Discharge status: Home / Self Care | Payer: BC Managed Care – PPO | Attending: Physician Assistant | Admitting: Physician Assistant

## 2014-10-20 ENCOUNTER — Inpatient Hospital Stay (HOSPITAL_COMMUNITY): Payer: BC Managed Care – PPO

## 2014-10-20 ENCOUNTER — Encounter (HOSPITAL_COMMUNITY)
Admission: EM | Disposition: A | Discharge status: Home Health | Payer: Self-pay | Source: Home / Self Care | Attending: Thoracic Surgery (Cardiothoracic Vascular Surgery)

## 2014-10-20 ENCOUNTER — Encounter (HOSPITAL_COMMUNITY): Payer: Self-pay

## 2014-10-20 DIAGNOSIS — I70209 Unspecified atherosclerosis of native arteries of extremities, unspecified extremity: Secondary | ICD-10-CM | POA: Diagnosis present

## 2014-10-20 DIAGNOSIS — A419 Sepsis, unspecified organism: Principal | ICD-10-CM

## 2014-10-20 DIAGNOSIS — I39 Endocarditis and heart valve disorders in diseases classified elsewhere: Secondary | ICD-10-CM

## 2014-10-20 DIAGNOSIS — I351 Nonrheumatic aortic (valve) insufficiency: Secondary | ICD-10-CM

## 2014-10-20 DIAGNOSIS — I340 Nonrheumatic mitral (valve) insufficiency: Secondary | ICD-10-CM | POA: Diagnosis present

## 2014-10-20 DIAGNOSIS — B999 Unspecified infectious disease: Secondary | ICD-10-CM

## 2014-10-20 DIAGNOSIS — I639 Cerebral infarction, unspecified: Secondary | ICD-10-CM | POA: Diagnosis present

## 2014-10-20 DIAGNOSIS — I76 Septic arterial embolism: Secondary | ICD-10-CM

## 2014-10-20 DIAGNOSIS — Z8673 Personal history of transient ischemic attack (TIA), and cerebral infarction without residual deficits: Secondary | ICD-10-CM | POA: Diagnosis present

## 2014-10-20 DIAGNOSIS — I38 Endocarditis, valve unspecified: Secondary | ICD-10-CM

## 2014-10-20 HISTORY — DX: Nonrheumatic aortic (valve) insufficiency: I35.1

## 2014-10-20 HISTORY — DX: Unspecified infectious disease: B99.9

## 2014-10-20 HISTORY — DX: Septic arterial embolism: I76

## 2014-10-20 HISTORY — PX: TEE WITHOUT CARDIOVERSION: SHX5443

## 2014-10-20 HISTORY — DX: Nonrheumatic mitral (valve) insufficiency: I34.0

## 2014-10-20 LAB — CULTURE, BLOOD (ROUTINE X 2)

## 2014-10-20 LAB — URINE CULTURE: Culture: 20000

## 2014-10-20 LAB — ROCKY MTN SPOTTED FVR ABS PNL(IGG+IGM)
RMSF IGG: UNDETERMINED
RMSF IGM: 0.25 {index} (ref 0.00–0.89)

## 2014-10-20 LAB — GLUCOSE, CAPILLARY: Glucose-Capillary: 99 mg/dL (ref 65–99)

## 2014-10-20 LAB — RMSF, IGG, IFA

## 2014-10-20 SURGERY — ECHOCARDIOGRAM, TRANSESOPHAGEAL
Anesthesia: Moderate Sedation

## 2014-10-20 MED ORDER — FENTANYL CITRATE (PF) 100 MCG/2ML IJ SOLN
INTRAMUSCULAR | Status: AC
  Filled 2014-10-20: qty 2

## 2014-10-20 MED ORDER — GENTAMICIN IN SALINE 1.6-0.9 MG/ML-% IV SOLN
80.0000 mg | Freq: Two times a day (BID) | INTRAVENOUS | Status: DC
  Administered 2014-10-20 – 2014-10-22 (×4): 80 mg via INTRAVENOUS
  Filled 2014-10-20 (×5): qty 50

## 2014-10-20 MED ORDER — FENTANYL CITRATE (PF) 100 MCG/2ML IJ SOLN
INTRAMUSCULAR | Status: DC | PRN
  Administered 2014-10-20 (×2): 25 ug via INTRAVENOUS

## 2014-10-20 MED ORDER — GENTAMICIN IN SALINE 1.2-0.9 MG/ML-% IV SOLN
60.0000 mg | Freq: Two times a day (BID) | INTRAVENOUS | Status: DC
  Filled 2014-10-20 (×2): qty 50

## 2014-10-20 MED ORDER — MIDAZOLAM HCL 10 MG/2ML IJ SOLN
INTRAMUSCULAR | Status: DC | PRN
  Administered 2014-10-20: 3 mg via INTRAVENOUS
  Administered 2014-10-20: 2 mg via INTRAVENOUS

## 2014-10-20 MED ORDER — METOPROLOL TARTRATE 1 MG/ML IV SOLN
INTRAVENOUS | Status: AC
  Filled 2014-10-20: qty 5

## 2014-10-20 MED ORDER — MIDAZOLAM HCL 5 MG/ML IJ SOLN
INTRAMUSCULAR | Status: AC
  Filled 2014-10-20: qty 2

## 2014-10-20 MED ORDER — SODIUM CHLORIDE 0.9 % IV SOLN: INTRAVENOUS | Status: DC

## 2014-10-20 NOTE — Progress Notes (Signed)
  Echocardiogram Echocardiogram Transesophageal has been performed.  Alan [name] 10/20/2014, 1:48 PM

## 2014-10-20 NOTE — Interval H&P Note (Signed)
History and Physical Interval Note:  10/20/2014 7:29 AM  Alan [name]  has presented today for surgery, with the diagnosis of R/O VEGATATION  The various methods of treatment have been discussed with the patient and family. After consideration of risks, benefits and other options for treatment, the patient has consented to  Procedure(s): TRANSESOPHAGEAL ECHOCARDIOGRAM (TEE) (N/A) as a surgical intervention .  The patient's history has been reviewed, patient examined, no change in status, stable for surgery.  I have reviewed the patient's chart and labs.  Questions were answered to the patient's satisfaction.     Jenkins Rouge

## 2014-10-20 NOTE — Progress Notes (Signed)
PROGRESS NOTE  Alan [name] MPN:[id number] DOB: 10-09-1950 DOA: 10/17/2014 PCP: [name], MD  Alan [name] is a 64 y.o. male with PMH of prostate cancer, RLS, who resents result in mental status, dizziness and a rash. Per patient's wife, pt just hasn't felt well lately. Patient has been having dizziness and poor balance in the past 2-3 weeks. No unilateral weakness, vision change or hearing loss. Patient recently did a lot of yard work in [employer], such as cutting trees. He could have had insect bite per his wife. Today patient become confused and lethargic, and was found asleep by security guard at work (works at [employer]) at about 9:30 PM.    Assessment/Plan: Enterococcus bacteremia -IV vanv -UDS, HIV Ab, RPR normal - for TEE -ID consult MRI shows possible septic emboli Urine cx shows enterococcus as well  atrial fib -cardiology consult Alan [name] 3?-- risk of anticoagulation outweigh befits?  Acute resp failure - on 2 L currently, not on any O2 at home  Diarrhea -GI pathogen panel pending  Sepsis: with hypotension, tachycardia and fever. Bp improved to 109/66 after 5L of normal saline bolus in ED -pro calcitonin 0.99 -IVF: 5L of NS bolus in ED, followed by 125-->100 cc/h -+ blood cultures  Restless leg syndrome: -Pramipexole  Normocytic anemia: hgb 15.7 on 2010-->9.2. FOBT negative. -check anemia panel  Dizziness:  -see MRI  Code Status: full Family Communication: patient/  wife Disposition Plan: remain in SDU   Consultants:    Procedures:     HPI/Subjective: Feeling better, some SOB  Objective: Filed Vitals:   10/20/14 0419  BP: 121/68  Pulse:   Temp: 98.1 F (36.7 C)  Resp: 13    Intake/Output Summary (Last 24 hours) at 10/20/14 0754 Last data filed at 10/20/14 0600  Gross per 24 hour  Intake   2661 ml  Output   2000 ml  Net    661 ml   Filed Weights   10/18/14 0012 10/18/14 1241  Weight: 74.844 kg  (165 lb) 75.161 kg (165 lb 11.2 oz)    Exam:   General:  Awake, NAD  Cardiovascular: rrr  Respiratory: clear  Abdomen: +BS, soft  Musculoskeletal: no edema, mild rash   Data Reviewed: Basic Metabolic Panel:  Recent Labs Lab 10/18/14 0125 10/18/14 1008 10/19/14 0310  NA 129*  --  134*  K 4.0  --  3.7  CL 100*  --  106  CO2 21*  --  22  GLUCOSE 118*  --  122*  BUN 12  --  10  CREATININE 1.20  --  0.94  CALCIUM 8.3*  --  8.4*  MG  --  1.7  --    Liver Function Tests:  Recent Labs Lab 10/18/14 0125  AST 22  ALT 18  ALKPHOS 40  BILITOT 0.8  PROT 6.4*  ALBUMIN 2.4*    Recent Labs Lab 10/18/14 0125  LIPASE 72*   No results for input(s): AMMONIA in the last 168 hours. CBC:  Recent Labs Lab 10/18/14 0125 10/19/14 0310  WBC 8.9 6.3  NEUTROABS 7.0  --   HGB 9.2* 8.8*  HCT 28.4* 27.2*  MCV 84.3 84.2  PLT 231 212   Cardiac Enzymes: No results for input(s): CKTOTAL, CKMB, CKMBINDEX, TROPONINI in the last 168 hours. BNP (last 3 results) No results for input(s): BNP in the last 8760 hours.  ProBNP (last 3 results) No results for input(s): PROBNP in the last 8760 hours.  CBG:  Recent  Labs Lab 10/19/14 0811  GLUCAP 109*    Recent Results (from the past 240 hour(s))  Blood Culture (routine x 2)     Status: None (Preliminary result)   Collection Time: 10/18/14  1:11 AM  Result Value Ref Range Status   Specimen Description BLOOD RIGHT ANTECUBITAL  Final   Special Requests BOTTLES DRAWN AEROBIC AND ANAEROBIC 10CC  Final   Culture  Setup Time   Final    GRAM POSITIVE COCCI IN PAIRS IN BOTH AEROBIC AND ANAEROBIC BOTTLES CRITICAL RESULT CALLED TO, READ BACK BY AND VERIFIED WITH: Alan [name] 10/18/14 @ 53 M Alan [name]    Culture ENTEROCOCCUS SPECIES  Final   Report Status PENDING  Incomplete  Blood Culture (routine x 2)     Status: None (Preliminary result)   Collection Time: 10/18/14  1:19 AM  Result Value Ref Range Status   Specimen Description  BLOOD RIGHT WRIST  Final   Special Requests BOTTLES DRAWN AEROBIC AND ANAEROBIC 10CC  Final   Culture  Setup Time   Final    GRAM POSITIVE COCCI IN PAIRS IN BOTH AEROBIC AND ANAEROBIC BOTTLES CRITICAL RESULT CALLED TO, READ BACK BY AND VERIFIED WITH: Alan [name] 10/18/14 @ 55 M Alan [name]    Culture ENTEROCOCCUS SPECIES  Final   Report Status PENDING  Incomplete  Urine culture     Status: None (Preliminary result)   Collection Time: 10/18/14  2:56 AM  Result Value Ref Range Status   Specimen Description URINE, CATHETERIZED  Final   Special Requests NONE  Final   Culture CULTURE REINCUBATED FOR BETTER GROWTH  Final   Report Status PENDING  Incomplete  MRSA PCR Screening     Status: None   Collection Time: 10/18/14  5:58 PM  Result Value Ref Range Status   MRSA by PCR NEGATIVE NEGATIVE Final    Comment:        The GeneXpert MRSA Assay (FDA approved for NASAL specimens only), is one component of a comprehensive MRSA colonization surveillance program. It is not intended to diagnose MRSA infection nor to guide or monitor treatment for MRSA infections.      Studies: No results found.  Scheduled Meds: . heparin  5,000 Units Subcutaneous 3 times per day  . pramipexole  1.5 mg Oral QHS  . sodium chloride  3 mL Intravenous Q12H  . vancomycin  1,000 mg Intravenous Q12H   Continuous Infusions: . sodium chloride 100 mL/hr at 10/20/14 0424   Antibiotics Given (last 72 hours)    Date/Time Action Medication Dose Rate   10/18/14 1700 Given   cefTRIAXone (ROCEPHIN) 2 g in dextrose 5 % 50 mL IVPB 2 g 100 mL/hr   10/18/14 1728 Given   doxycycline (VIBRAMYCIN) 100 mg in dextrose 5 % 250 mL IVPB 100 mg 125 mL/hr   10/19/14 0549 Given   doxycycline (VIBRAMYCIN) 100 mg in dextrose 5 % 250 mL IVPB 100 mg 125 mL/hr   10/19/14 0902 Given   doxycycline (VIBRAMYCIN) 100 mg in dextrose 5 % 250 mL IVPB 100 mg 125 mL/hr   10/19/14 1610 Given   vancomycin (VANCOCIN) 1,500 mg in sodium chloride 0.9  % 500 mL IVPB 1,500 mg 250 mL/hr   10/19/14 2200 Given   vancomycin (VANCOCIN) IVPB 1000 mg/200 mL premix 1,000 mg 200 mL/hr      Principal Problem:   Enterococcal bacteremia Active Problems:   Restless leg syndrome   Prostate cancer   Acute encephalopathy   Rash   Sepsis  Normocytic anemia   Dizziness   Acute respiratory failure   Aortic valve endocarditis   Endocarditis of mitral valve   Paroxysmal atrial fibrillation   Paroxysmal atrial flutter    Time spent: 35 min    Alan [name]  [facility] Pager [phone]. If 7PM-7AM, please contact night-coverage at [email or web address], password [facility] 10/20/2014, 7:54 AM  LOS: 2 days

## 2014-10-20 NOTE — CV Procedure (Signed)
5 ug fentanyl  7 mg versed TEE:  Mildl MR with vegetation on atrial surface of anterior mitral leaflet Severe AR with large mobile vegetation on the non coronary cusp Normal LV EF 65% No intervalvular fibrosa aneurysm or ring abscess Normal TV,PV Normal RV Normal aorta No ASD No LAA thrombus. No pericardial effusion    Discussed with Dr Sallyanne Kuster.  Jenkins Rouge

## 2014-10-20 NOTE — Progress Notes (Signed)
ANTIBIOTIC CONSULT NOTE - INITIAL  Pharmacy Consult for  Gentamicin Indication: endocarditis  Allergies  Allergen Reactions  . Penicillins Swelling    Taste buds on tongue swell    Patient Measurements: Height: 6' (182.9 cm) Weight: 165 lb 11.2 oz (75.161 kg) IBW/kg (Calculated) : 77.6   Vital Signs: Temp: 97.8 F (36.6 C) (08/26 1427) Temp Source: Oral (08/26 1427) BP: 133/85 mmHg (08/26 1427) Pulse Rate: 95 (08/26 1427) Intake/Output from previous day: 08/25 0701 - 08/26 0700 In: 2761 [P.O.:358; I.V.:2203; IV Piggyback:200] Out: 2000 [Urine:2000] Intake/Output from this shift: Total I/O In: 803 [I.V.:803] Out: -   Labs:  Recent Labs  10/18/14 0125 10/19/14 0310  WBC 8.9 6.3  HGB 9.2* 8.8*  PLT 231 212  CREATININE 1.20 0.94   Estimated Creatinine Clearance: 82.2 mL/min (by C-G formula based on Cr of 0.94). No results for input(s): VANCOTROUGH, VANCOPEAK, VANCORANDOM, GENTTROUGH, GENTPEAK, GENTRANDOM, TOBRATROUGH, TOBRAPEAK, TOBRARND, AMIKACINPEAK, AMIKACINTROU, AMIKACIN in the last 72 hours.   Microbiology: Recent Results (from the past 720 hour(s))  Blood Culture (routine x 2)     Status: None   Collection Time: 10/18/14  1:11 AM  Result Value Ref Range Status   Specimen Description BLOOD RIGHT ANTECUBITAL  Final   Special Requests BOTTLES DRAWN AEROBIC AND ANAEROBIC 10CC  Final   Culture  Setup Time   Final    GRAM POSITIVE COCCI IN PAIRS IN BOTH AEROBIC AND ANAEROBIC BOTTLES CRITICAL RESULT CALLED TO, READ BACK BY AND VERIFIED WITH: P CARBORNE 10/18/14 @ 46 M VESTAL    Culture ENTEROCOCCUS SPECIES  Final   Report Status 10/20/2014 FINAL  Final   Organism ID, Bacteria ENTEROCOCCUS SPECIES  Final      Susceptibility   Enterococcus species - MIC*    AMPICILLIN <=2 SENSITIVE Sensitive     VANCOMYCIN 1 SENSITIVE Sensitive     GENTAMICIN SYNERGY SENSITIVE Sensitive     LINEZOLID 2 SENSITIVE Sensitive     * ENTEROCOCCUS SPECIES  Blood Culture (routine  x 2)     Status: None   Collection Time: 10/18/14  1:19 AM  Result Value Ref Range Status   Specimen Description BLOOD RIGHT WRIST  Final   Special Requests BOTTLES DRAWN AEROBIC AND ANAEROBIC 10CC  Final   Culture  Setup Time   Final    GRAM POSITIVE COCCI IN PAIRS IN BOTH AEROBIC AND ANAEROBIC BOTTLES CRITICAL RESULT CALLED TO, READ BACK BY AND VERIFIED WITH: P CARBORNE 10/18/14 @ 43 M VESTAL    Culture   Final    ENTEROCOCCUS SPECIES SUSCEPTIBILITIES PERFORMED ON PREVIOUS CULTURE WITHIN THE LAST 5 DAYS.    Report Status 10/20/2014 FINAL  Final  Urine culture     Status: None   Collection Time: 10/18/14  2:56 AM  Result Value Ref Range Status   Specimen Description URINE, CATHETERIZED  Final   Special Requests NONE  Final   Culture 20,000 COLONIES/mL ENTEROCOCCUS FAECALIS  Final   Report Status 10/20/2014 FINAL  Final   Organism ID, Bacteria ENTEROCOCCUS FAECALIS  Final      Susceptibility   Enterococcus faecalis - MIC*    AMPICILLIN <=2 SENSITIVE Sensitive     LEVOFLOXACIN 1 SENSITIVE Sensitive     NITROFURANTOIN <=16 SENSITIVE Sensitive     VANCOMYCIN 1 SENSITIVE Sensitive     LINEZOLID 2 SENSITIVE Sensitive     * 20,000 COLONIES/mL ENTEROCOCCUS FAECALIS  MRSA PCR Screening     Status: None   Collection Time: 10/18/14  5:58  PM  Result Value Ref Range Status   MRSA by PCR NEGATIVE NEGATIVE Final    Comment:        The GeneXpert MRSA Assay (FDA approved for NASAL specimens only), is one component of a comprehensive MRSA colonization surveillance program. It is not intended to diagnose MRSA infection nor to guide or monitor treatment for MRSA infections.     Medical History: Past Medical History  Diagnosis Date  . Restless leg syndrome   . Prostate cancer     Assessment: Mr. 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 He is currently on Vancomycin (due to his history of PCN  allergy).   Pharmacy to dose gentamicin for synergy. Wt = 75 kg, SCr 0.94 (CrCl 82 ml/min)  Goal of Therapy:  Gentamicin peak 3-4 Gentamicin trough <1  Plan:  - Will start gentamicin IV 80 mg Q12H tonight - Obtain gentamicin trough before the 0600 dose on 8/28 - Obtain gentamicin peak after the 0600 dose on 8/28  - Monitor drug levels, SCr and clinical progression.  Dimitri Ped, PharmD. Clinical Pharmacist Resident Pager: [phone]

## 2014-10-20 NOTE — Progress Notes (Signed)
[name]       [place] [address]             [phone]          CARDIOTHORACIC SURGERY CONSULTATION REPORT  PCP is HOWELL,TRAVIS, MD Referring Provider is Sanda Klein, MD  Reason for consultation:  Bacterial Endocarditis  HPI:  Patient is a 64 year old male with history of prostate cancer and restless leg syndrome who has otherwise been in relatively good health all of his life until recently. He states that approximately 7 weeks ago he began to experience gradual onset of progressive fatigue, anorexia, low-grade fevers, chills, and night sweats. He has lost more than 20 pounds in weight and become progressively weak.  He eventually began having intermittent dizzy spells. On 10/18/2014 he became acutely confused and lethargic. He was noted to have fever of 101.2 degrees Fahrenheit and petechial rash over both legs. He was brought to the emergency department where he was hypotensive on presentation but responded to fluid resuscitation. 2 of 2 sets of blood cultures obtained at the time of admission have grown enterococcus faecalis sensitive to ampicillin, vancomycin, and several other antibiotics.  Transthoracic and transesophageal echocardiograms demonstrate vegetations on both aortic and mitral valve. Transesophageal echocardiogram confirms the presence of severe aortic insufficiency and mild mitral regurgitation. There are no signs of annular abscess formation or other complicating features. MRI of the brain demonstrates scattered small acute to subacute lacunar infarcts in both MCA territories and in the cerebellum consistent with septic emboli.  Cardiothoracic surgical consultation was requested.  The patient is married and lives in [place] with his wife.  He works in a Education officer, environmental at [employer].  He has been physically active for all of his life until recently. He has 1 adult daughter. He denies any symptoms of shortness of breath either with activity or at  rest, but he states that he has been avoiding most activities because of severe fatigue. He denies any PND, orthopnea, or lower extremity edema. He has never had any chest pain or chest tightness. He has been having some mild headaches recently. He denies any transient focal neurologic deficits.  He denies any history of constipation, change in bowel habits, or hematochezia. He states that he last had a screening colonoscopy approximately 5 years ago.  Past Medical History  Diagnosis Date  . Restless leg syndrome   . Prostate cancer   . 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 Paroxysmal atrial fibrillation 10/19/2014  . Paroxysmal atrial flutter 10/19/2014  . Enterococcal bacteremia 10/19/2014    Past Surgical History  Procedure Laterality Date  . Prostatectomy      Family History  Problem Relation Age of Onset  . CAD Neg Hx     Social History   Social History  . Marital Status: Married    Spouse Name: N/A  . Number of Children: N/A  . Years of Education: N/A   Occupational History  . Not on file.   Social History Main Topics  . Smoking status: Former Research scientist (life sciences)  . Smokeless tobacco: Not on file     Comment: Smoked a pipe age 79->28  . Alcohol Use: No     Comment: Drank from age 51->38  . Drug Use: No  . Sexual Activity: Not on file   Other Topics Concern  .  Not on file   Social History Narrative    Prior to Admission medications   Medication Sig Start Date End Date Taking? Authorizing Provider  pramipexole (MIRAPEX) 1.5 MG tablet Take 1.5 mg by mouth at bedtime.   Yes Historical Provider, MD    Current Facility-Administered Medications  Medication Dose Route Frequency Provider Last Rate Last Dose  . 0.9 %  sodium chloride infusion   Intravenous Continuous Ivor Costa, MD 100 mL/hr at  10/20/14 0424    . acetaminophen (TYLENOL) tablet 650 mg  650 mg Oral Q6H PRN Ivor Costa, MD      . gentamicin (GARAMYCIN) IVPB 80 mg  80 mg Intravenous Q12H Michel Bickers, MD   80 mg at 10/20/14 1808  . heparin injection 5,000 Units  5,000 Units Subcutaneous 3 times per day Ivor Costa, MD   5,000 Units at 10/20/14 0424  . ondansetron (ZOFRAN) tablet 4 mg  4 mg Oral Q6H PRN Ivor Costa, MD       Or  . ondansetron [facility]) injection 4 mg  4 mg Intravenous Q6H PRN Ivor Costa, MD      . pramipexole (MIRAPEX) tablet 1.5 mg  1.5 mg Oral QHS Ivor Costa, MD   1.5 mg at 10/20/14 2001  . sodium chloride 0.9 % injection 3 mL  3 mL Intravenous Q12H Ivor Costa, MD   3 mL at 10/20/14 2002  . vancomycin (VANCOCIN) IVPB 1000 mg/200 mL premix  1,000 mg Intravenous Q12H Cecilio Asper Batchelder, RPH   1,000 mg at 10/20/14 1036    Allergies  Allergen Reactions  . Penicillins Swelling    Taste buds on tongue swell      Review of Systems:   General:  decreased appetite, decreased energy, no weight gain, + 20 lb. weight loss, + fever  Cardiac:  no chest pain with exertion, no chest pain at rest, no SOB with exertion, no resting SOB, no PND, no orthopnea, no palpitations, + arrhythmia, + atrial fibrillation, no LE edema, + dizzy spells, no syncope  Respiratory:  no shortness of breath, no home oxygen, no productive cough, no dry cough, no bronchitis, no wheezing, no hemoptysis, no asthma, no pain with inspiration or cough, no sleep apnea, no CPAP at night  GI:   no difficulty swallowing, no reflux, no frequent heartburn, no hiatal hernia, no abdominal pain, no constipation, no diarrhea, no hematochezia, no hematemesis, no melena  GU:   no dysuria,  no frequency, no urinary tract infection, no hematuria, no enlarged prostate, no kidney stones, no kidney disease  Vascular:  no pain suggestive of claudication, no pain in feet, no leg cramps, no varicose veins, no DVT, no non-healing foot ulcer  Neuro:   no stroke, no TIA's,  no seizures, + headaches, no temporary blindness one eye,  no slurred speech, no peripheral neuropathy, no chronic pain, no instability of gait, no memory/cognitive dysfunction  Musculoskeletal: mild arthritis - primarily involving the hands, no joint swelling, no myalgias, no difficulty walking, normal mobility   Skin:   + rash, no itching, no skin infections, no pressure sores or ulcerations  Psych:   no anxiety, no depression, no nervousness, no unusual recent stress  Eyes:   no blurry vision, no floaters, no recent vision changes, does not wear glasses or contacts  ENT:   no hearing loss, no loose or painful teeth, no dentures, last saw dentist within the past year  Hematologic:  no easy bruising, no abnormal bleeding, no clotting disorder, no frequent  epistaxis  Endocrine:  no diabetes, does not check CBG's at home     Physical Exam:   BP 129/70 mmHg  Pulse 98  Temp(Src) 98.1 F (36.7 C) (Oral)  Resp 19  Ht 6' (1.829 m)  Wt 75.161 kg (165 lb 11.2 oz)  BMI 22.47 kg/m2  SpO2 93%  General:    well-appearing  HEENT:  Unremarkable   Neck:   no JVD, no bruits, no adenopathy   Chest:   clear to auscultation, symmetrical breath sounds, no wheezes, no rhonchi   CV:   RRR, grade III/VI diastolic murmur best along sternal borner  Abdomen:  soft, non-tender, no masses   Extremities:  warm, well-perfused, pulses palpable, no lower extremity edema  Rectal/GU  Deferred  Neuro:   Grossly non-focal and symmetrical throughout  Skin:   Clean and dry, + petechial rashes, no breakdown  Diagnostic Tests:  Transthoracic Echocardiography  Patient:  Alan [name], Alan [name] MR #:    [id number] Study Date: 10/19/2014 Gender:   M Age:    28 Height:   182.9 cm Weight:   74.8 kg BSA:    1.95 m^2 Pt. Status: Room:    2C03C  Coral Else [id number] ATTENDING  Chauncy Passy REFERRING  Geradine Girt SONOGRAPHER Jimmy Reel,  RDCS PERFORMING  [facility], Inpatient  cc:  ------------------------------------------------------------------- LV EF: 65% -  70%  ------------------------------------------------------------------- Indications:   Bacteremia 790.7.  ------------------------------------------------------------------- History:  PMH: No prior cardiac history.  ------------------------------------------------------------------- Study Conclusions  - Left ventricle: Systolic function was vigorous. The estimated ejection fraction was in the range of 65% to 70%. - Aortic valve: Mass attached to ventricular side of noncoronary cusp Measures 11 x 8 mm There appeas to be aonther mass associated with R coronary cusp (not well imaged). Moderate aortic insufficiency. There was mild regurgitation. - Mitral valve: Mass attached to atrial side of anterior mitral leaflet 10 x 6 mm. Consistent with vegetation. Mild MR. - Left atrium: The atrium was mildly dilated. - Right atrium: The atrium was mildly dilated.  Impressions:  - Recommend TEE to further evaluate masses Short burst of tachycardia during exam.  Transthoracic echocardiography. M-mode, complete 2D, spectral Doppler, and color Doppler. Birthdate: Patient birthdate: [birth year]. Age: Patient is 64 yr old. Sex: Gender: male. BMI: 22.4 kg/m^2. Blood pressure:   132/64 Patient status: Inpatient. Study date: Study date: 10/19/2014. Study time: 01:18 PM. Location: ICU/CCU  -------------------------------------------------------------------  ------------------------------------------------------------------- Left ventricle: The cavity size was normal. Wall thickness was increased in a pattern of mild LVH. Systolic function was vigorous. The estimated ejection fraction was in the range of 65% to 70%.  ------------------------------------------------------------------- Aortic valve: Mass attached to ventricular side of  noncoronary cusp Measures 11 x 8 mm There appeas to be aonther mass associated with R coronary cusp (not well imaged). Moderate aortic insufficiency. Doppler: There was mild regurgitation.  ------------------------------------------------------------------- Mitral valve: Mass attached to atrial side of anterior mitral leaflet 10 x 6 mm. Consistent with vegetation. Mild MR. Doppler: Peak gradient (D): 6 mm Hg.  ------------------------------------------------------------------- Left atrium: The atrium was mildly dilated.  ------------------------------------------------------------------- Right ventricle: The cavity size was normal. Wall thickness was normal. Systolic function was normal.  ------------------------------------------------------------------- Pulmonic valve:  Mildly thickened leaflets. Doppler: There was mild regurgitation.  ------------------------------------------------------------------- Tricuspid valve:  Structurally normal valve.  Leaflet separation was normal. Doppler: Transvalvular velocity was within the normal range. There was mild regurgitation.  ------------------------------------------------------------------- Right atrium: The atrium was mildly dilated.  -------------------------------------------------------------------  Pericardium: There was no pericardial effusion.  ------------------------------------------------------------------- Measurements  Left ventricle              Value    Reference LV ID, ED, PLAX chordal     (L)   32.2 mm   43 - 52 LV ID, ES, PLAX chordal         23.5 mm   23 - 38 LV fx shortening, PLAX chordal  (L)   27  %   >=29 LV PW thickness, ED           14.5 mm   --------- IVS/LV PW ratio, ED           0.96     <=1.3 LV ejection fraction, 1-p A4C      69  %   --------- LV end-diastolic volume, 2-p       68   ml   --------- LV end-systolic volume, 2-p       23  ml   --------- LV ejection fraction, 2-p        66  %   --------- Stroke volume, 2-p            44  ml   --------- LV end-diastolic volume/bsa, 2-p     35  ml/m^2 --------- LV end-systolic volume/bsa, 2-p     12  ml/m^2 --------- Stroke volume/bsa, 2-p          22.7 ml/m^2 ---------  Ventricular septum            Value    Reference IVS thickness, ED            13.9 mm   ---------  LVOT                   Value    Reference LVOT ID, S                20  mm   --------- LVOT area                3.14 cm^2  ---------  Aorta                  Value    Reference Aortic root ID, ED            26  mm   ---------  Left atrium               Value    Reference LA ID, A-P, ES              42  mm   --------- LA ID/bsa, A-P              2.16 cm/m^2 <=2.2 LA volume, S               57.8 ml   --------- LA volume/bsa, S             29.7 ml/m^2 --------- LA volume, ES, 1-p A4C          53.2 ml   --------- LA volume/bsa, ES, 1-p A4C        27.3 ml/m^2 --------- LA volume, ES, 1-p A2C          54.8 ml   --------- LA volume/bsa, ES, 1-p A2C        28.1 ml/m^2 ---------  Mitral valve               Value    Reference Mitral E-wave peak velocity  122  cm/s  --------- Mitral A-wave peak velocity       98.9 cm/s  --------- Mitral deceleration time     (H)   259  ms   150 - 230 Mitral peak gradient, D         6   mm Hg --------- Mitral E/A ratio, peak          1.2     ---------  Pulmonary arteries            Value     Reference PA pressure, S, DP            28  mm Hg <=30  Tricuspid valve             Value    Reference Tricuspid regurg peak velocity      248  cm/s  --------- Tricuspid peak RV-RA gradient      25  mm Hg ---------  Systemic veins              Value    Reference Estimated CVP              3   mm Hg ---------  Right ventricle             Value    Reference TAPSE                  31.3 mm   --------- RV pressure, S, DP            28  mm Hg <=30 RV s&', lateral, S            17.8 cm/s  ---------  Legend: (L) and (H) mark values outside specified reference range.  ------------------------------------------------------------------- Prepared and Electronically Authenticated by  Dorris Carnes, M.D. 2016-08-25T15:25:12   Transesophageal Echocardiography  Patient:  Alan [name], Alan [name] MR #:    [id number] Study Date: 10/20/2014 Gender:   M Age:    71 Height:   182.9 cm Weight:   74.8 kg BSA:    1.95 m^2 Pt. Status: Room:  PERFORMING  Jenkins Rouge, M.D. ATTENDING  Dunn, Dayna N ORDERING   Dunn, Dayna N SONOGRAPHER Johny Chess, RDCS, CCT  cc:  -------------------------------------------------------------------  ------------------------------------------------------------------- Indications:   Endocarditis 421.9.  ------------------------------------------------------------------- Study Conclusions  - Impressions: Patient had frequent bouts of rapid atrial flutter during case that made images difficult Severe AR with vegetation on non coronary cusp ventricular side . Large 1.5 cm and mobile Mild MR with smaller vegetation on anterior leaflet atrial side. No abscess or intervalvular fibrosa involvement Normal EF 65% Normal TV, PV No effusion No ASD No  aortic debris No LAA thrombus.  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 Large 1.5 cm and mobile Mild MR with smaller vegetation on anterior leaflet atrial side. No abscess or intervalvular fibrosa involvement Normal EF 65% Normal TV, PV No effusion No ASD No aortic debris No LAA thrombus.  Diagnostic transesophageal echocardiography. 2D and color Doppler. Birthdate: Patient birthdate: 02-15-1951. Age: Patient is 64 yr old. Sex: Gender: male.  BMI: 22.4 kg/m^2. Blood pressure: 110/42 Patient status: Inpatient. Study date: Study date: 10/20/2014. Study time: 12:29 PM. Location: Endoscopy.  -------------------------------------------------------------------  ------------------------------------------------------------------- Aortic valve:  Doppler:   Mean gradient (S): 9 mm Hg. Peak gradient (S): 17 mm Hg.  ------------------------------------------------------------------- Measurements  Aortic valve  Value Aortic valve peak velocity, S   206  cm/s Aortic valve mean velocity, S   137  cm/s Aortic valve VTI, S        34.5 cm Aortic mean gradient, S      9   mm Hg Aortic peak gradient, S      17  mm Hg  Legend: (L) and (H) mark values outside specified reference range.  ------------------------------------------------------------------- Prepared and Electronically Authenticated by  Jenkins Rouge, M.D. 2016-08-26T17:11:37        MRI HEAD WITHOUT CONTRAST  MRA HEAD WITHOUT CONTRAST  TECHNIQUE: Multiplanar, multiecho pulse sequences of the brain and surrounding structures were obtained without intravenous contrast. Angiographic images of the head were obtained using MRA technique without contrast.  COMPARISON: Head CT  without contrast 08/08/2008.  FINDINGS: MRI HEAD FINDINGS  Wedge-shaped encephalomalacia in the anterior left middle temporal gyrus (series 9, image 19). 4-5 small foci of chronic appearing microhemorrhage, the largest in the left occipital pole on series 11, image 42. On diffusion there are scattered small foci of increased trace diffusion signal in both hemispheres. The majority year in the left MCA territory, but right MCA and right MCA/ PCA watershed territory is also affected. Furthermore, there are several similar small foci in the left cerebellar hemisphere. Some of these demonstrate facilitated diffusion on ADC, and T2 and FLAIR hyperintensity (lesion in the posterior left corona radiata on series 5, image 37). Some are restricted.  Deep gray matter nuclei and brainstem are spared. Major intracranial vascular flow voids are preserved with mild intracranial artery dolichoectasia.  No intracranial mass effect. No ventriculomegaly. Cerebral volume is within normal limits for age. Negative pituitary, cervicomedullary junction and visualized cervical spine.  Visible internal auditory structures appear normal. Trace left mastoid effusion. Paranasal sinuses and right mastoid are clear. Negative orbit and scalp.  Nonspecific decreased bone marrow signal at the skullbase and in the visualized cervical spine, patchy geographic pattern. More normal marrow signal in the calvarium.  MRA HEAD FINDINGS  Antegrade flow in the posterior circulation with dominant distal left vertebral artery. Mild distal left vertebral artery atherosclerosis without significant stenosis. Patent vertebrobasilar junction. Normal left PICA. Dominant appearing right AICA. No basilar artery stenosis. Normal SCA and PCA origins. Small posterior communicating arteries. Normal bilateral PCA branches.  Antegrade flow in both ICA siphons. Evidence of calcified plaque without siphon stenosis. Tortuous  but otherwise normal ophthalmic artery origins, more so the left. Patent carotid termini. Normal MCA and ACA origins. Normal posterior communicating artery origins.  Tortuous proximal ACA is. Diminutive or absent anterior communicating artery. Visualized bilateral ACA branches are within normal limits. Mildly tortuous bilateral MCAs. No MCA stenosis or branch occlusion identified. Overall mild generalized intracranial artery dolichoectasia.  IMPRESSION: 1. Scattered small acute to subacute, and occasional early chronic, lacunar infarcts in both MCA territories and the left cerebellum. None of these appear hemorrhagic (cerebral septic emboli are prone to bleed), but consider sequelae of endocarditis and septic emboli in this setting. No significant edema or mass effect. 2. Chronic anterior division left MCA infarct. A small number of chronic micro hemorrhages are identified in both hemispheres an the left cerebellum. 3. Mild for age intracranial atherosclerosis with no stenosis or branch occlusion identified. Mild intracranial artery dolichoectasia.   Electronically Signed  By: Genevie Ann M.D.  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 I have personally reviewed the  patient's transthoracic and transesophageal echocardiograms. He has obvious vegetations on both the aortic and the mitral valve, with a 1.5 cm vegetation adherent to the aortic valve. There is severe aortic insufficiency and mild mitral regurgitation.  There is no sign of annular abscess formation nor other complicating features.  The patient does not have signs or symptoms of congestive heart failure and clinically he seems to be improving with intravenous antibiotic therapy.  However, the patient has already experienced subclinical small septic emboli to the brain, he has severe aortic insufficiency, he has  developed paroxysmal atrial fibrillation, and there remains a moderately large residual vegetation on the aortic valve. Under the circumstances I feel that the patient will clearly need surgical intervention at some point, and it probably should be sooner rather than later.   Plan:  At some point the patient will need to undergo left and right heart catheterization.  He probably should be seen in consultation by gastroenterology to consider screening colonoscopy.  We will continue to follow along closely.  I discussed matters at length with the patient in his hospital room this evening.  I spent in excess of 120 minutes during the conduct of this hospital consultation and >50% of this time involved direct face-to-face encounter for counseling and/or coordination of the patient's care.  [name]. Roxy Manns, MD 10/20/2014 9:11 PM

## 2014-10-20 NOTE — H&P (View-Only) (Signed)
Cardiology Consultation Note  Patient ID: Alan [name], MRN: [id number], DOB/AGE: 06-21-1950 64 y.o. Admit date: 10/17/2014   Date of Consult: 10/19/2014 Primary Physician: [name], MD Primary Cardiologist: New to Dr. Sallyanne Kuster  Chief Complaint: fatigue, altered mental status Reason for Consultation: tachycardia including newly recognized PAF  HPI: Alan [name] is a 64 y/o M with history of RLS, prostate CA, remote tobacco/alcohol use who presented to [facility] 10/17/2014 with altered mental status, dizziness, and rash. For the past 2-3 weeks he's been haing a lot of dizziness and poor balance as well as poor appetite and resultant 20lb weight loss. On the evening of ER presentation, the patient became confused that he had a work order waiting for him. He thought it was morning so he drove to work where he was found to be asleep by a security guard at [employer] (works at [employer]). He was also found to have fever 101.2 and petechial rashes over bilateral legs. By the time he was seen in the ER, mental status had improved. In ED, patient was found to have hypotension with SBP ~ 80s which improve to 109/66 after 5L of NS bolus in ED. WBC 8.9, Hgb 9.2->8.8 (last 15.7 in 2010), Na 129, Lipase 72, albumin 2.4, glucose 118, CRP 9.9, lactic acid WNL, FOBT negative. Further workup revealed positive blood cultures (GPC in pairs - enterococcus) and abnormal echocardiogram concerning for vegetations. Due to the patient working outside and concern for tickborne illness, RMSF and Lyme titers are pending. Other workup includes negative HIV, RPR, normal TSH. On telemetry today he was found to have paroxysms of tachycardia which appear to be both definite PAF and a separate paroxysmal atrial flutter. This was uncovered during our assessment when we were called to arrange TEE. He is currently in NSR. He denies any CP, recent SOB (reports dyspnea during a hike 2 years ago), LEE,  orthopnea, palpitations, syncope, BRBPR, melena or known bleeding source. He is on vancomycin. ID has been asked to consult by internal medicine. He developed diarrhea last night and is on enteric precautions while C diff is ruled out.   Past Medical History  Diagnosis Date  . Restless leg syndrome   . Prostate cancer       Most Recent Cardiac Studies: 2D Echo 10/19/14 - Left ventricle: Systolic function was vigorous. The estimated ejection fraction was in the range of 65% to 70%. - Aortic valve: Mass attached to ventricular side of noncoronary cusp Measures 11 x 8 mm There appeas to be aonther mass associated with R coronary cusp (not well imaged). Moderate aortic insufficiency. There was mild regurgitation. - Mitral valve: Mass attached to atrial side of anterior mitral leaflet 10 x 6 mm. Consistent with vegetation. Mild MR. - Left atrium: The atrium was mildly dilated. - Right atrium: The atrium was mildly dilated. Impressions: - Recommend TEE to further evaluate masses Short burst of tachycardia during exam.   Surgical History:  Past Surgical History  Procedure Laterality Date  . Prostatectomy       Home Meds: Prior to Admission medications   Medication Sig Start Date End Date Taking? Authorizing Provider  pramipexole (MIRAPEX) 1.5 MG tablet Take 1.5 mg by mouth at bedtime.   Yes Historical Provider, MD    Inpatient Medications:  . heparin  5,000 Units Subcutaneous 3 times per day  . pramipexole  1.5 mg Oral QHS  . sodium chloride  3 mL Intravenous Q12H  . vancomycin  1,000 mg  Intravenous Q12H   . sodium chloride 100 mL/hr at 10/19/14 0144    Allergies:  Allergies  Allergen Reactions  . Penicillins Swelling    Taste buds on tongue swell    Social History   Social History  . Marital Status: Married    Spouse Name: N/A  . Number of Children: N/A  . Years of Education: N/A   Occupational History  . Not on file.   Social History Main Topics    . Smoking status: Former Research scientist (life sciences)  . Smokeless tobacco: Not on file     Comment: Smoked a pipe age 47->28  . Alcohol Use: No     Comment: Drank from age 31->38  . Drug Use: No  . Sexual Activity: Not on file   Other Topics Concern  . Not on file   Social History Narrative     Family History  Problem Relation Age of Onset  . CAD Neg Hx      Review of Systems: No h/o syncope or bleeding. All other systems reviewed and are otherwise negative except as noted above.  Labs:  Lab Results  Component Value Date   WBC 6.3 10/19/2014   HGB 8.8* 10/19/2014   HCT 27.2* 10/19/2014   MCV 84.2 10/19/2014   PLT 212 10/19/2014    Recent Labs Lab 10/18/14 0125 10/19/14 0310  NA 129* 134*  K 4.0 3.7  CL 100* 106  CO2 21* 22  BUN 12 10  CREATININE 1.20 0.94  CALCIUM 8.3* 8.4*  PROT 6.4*  --   BILITOT 0.8  --   ALKPHOS 40  --   ALT 18  --   AST 22  --   GLUCOSE 118* 122*   Radiology/Studies:  Dg Chest 2 View  10/18/2014   CLINICAL DATA:  Fatigue, fever, and weakness for 3 weeks. History of prostate cancer.  EXAM: CHEST  2 VIEW  COMPARISON:  08/08/2008  FINDINGS: The heart size and mediastinal contours are within normal limits. Both lungs are clear. The visualized skeletal structures are unremarkable.  IMPRESSION: No active cardiopulmonary disease.   Electronically Signed   By: Lucienne Capers M.D.   On: 10/18/2014 00:49    Wt Readings from Last 3 Encounters:  10/18/14 165 lb 11.2 oz (75.161 kg)    EKG: NSR 95bpm with PACs, nonspecific TWI inferiorly, flattening V4-V6  Physical Exam: Blood pressure 117/63, pulse 94, temperature 97.7 F (36.5 C), temperature source Oral, resp. rate 21, height 6' (1.829 m), weight 165 lb 11.2 oz (75.161 kg), SpO2 95 %. General: Well developed, well nourished, in no acute distress. Head: Normocephalic, atraumatic, sclera non-icteric, no xanthomas, nares are without discharge.  Neck: Negative for carotid bruits. JVD not elevated. Lungs: Clear  bilaterally to auscultation without wheezes, rales, or rhonchi. Breathing is unlabored. Heart: RRR with S1 S2. No murmurs, rubs, or gallops appreciated. Abdomen: Soft, non-tender, non-distended with normoactive bowel sounds. No hepatomegaly. No rebound/guarding. No obvious abdominal masses. Msk:  Strength and tone appear normal for age. Extremities: No clubbing or cyanosis. No edema.  Distal pedal pulses are 2+ and equal bilaterally. Suggestion of faint petechia around ankles on LE. No splinter hemorrhages or Osler nodes. Neuro: Alert and oriented X 3. No facial asymmetry. No focal deficit. Moves all extremities spontaneously. Psych:  Responds to questions appropriately with a normal affect.    Assessment and Plan:   1. Acute encephalopathy and fever with + enterococcus by 2/2 blood cultures and probable endocarditis 2. Paroxysmal atrial fibrillation and  paroxysmal atrial flutter, newly recognized 3. Anemia of unknown chronicity, possibly related to above process 4. Hyponatremia, improved 5. Recent 20lb weight loss likely due to the above 6. 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 ID is on board, currently treating with vancomycin. TEE scheduled for tomorrow as previously discussed. Regarding findings of PAF/atrial flutter, it is not known in the past if he has ever had this before - this is the first documentation of such. He has been asymptomatic with this. BP a little soft at present time so will not start AVN blocking agent - PAF/PAFL may be due to acute medical illness but we will follow on telemetry.Thyroid function is normal. CHADSVASC is currently only 1 for age >65 so he does not currently require anticoagulation. However, he does have mild hyperglycemia thus will check A1C to risk stratify. Per discussion with Dr. Sallyanne Kuster, the patient should have MRA of the head to exclude mycotic aneurysms given 2-3 weeks of symptoms. If  his CHADSVASC is found to be 2 or greater after A1C results, the MRA would need to be done prior to clearing for anticoagulation. We will f/u in AM.  Signed, Dayna, Dunn PA-C 10/19/2014, 4:32 PM Pager: [phone]  I have seen and examined the patient along with Melina Copa PA-C.  I have reviewed the chart, notes and new data.  I agree with PA's note.  Key new complaints: typical symptoms of "slow" bacterial endocarditis, explaining weight loss, anemia, et. Encephalopathy and dizziness likely also related, but raise concern for secondary complications such as cerebral abscess or mycotic aneurysms Key examination changes: 1/6 MR murmur, cannot hear diastolic murmur Key new findings / data: echo with vegetations on both the aortic valve and anterior mitral leaflet by TTE, normal EF. Recurrent episodes of atrial flutter with 2:1 AV block 145 bpm, less often atrial fibrillation with RVR 150s.  PLAN: TEE to look for other complications such as annular abscess. MRA for possible mycotic aneurysms Will need GI w\u for source of bacteremia. Low embolic risk - hold off anticoagulation for now. Rate control meds  Sanda Klein, MD, [facility] [phone] 10/19/2014, 6:28 PM

## 2014-10-20 NOTE — Progress Notes (Signed)
OT Cancellation Note  Patient Details Name: Alan [name] MRN: [id number] DOB: [birth year]   Cancelled Treatment:    Reason Eval/Treat Not Completed: Patient at procedure or test/ unavailable. Pt currently at MRI and with TEE scheduled for later this morning.  Will continue to follow.  [name] 10/20/2014, 9:06 AM  [phone]

## 2014-10-20 NOTE — Progress Notes (Signed)
Physical Therapy Treatment Patient Details Name: Alan [name] MRN: [id number] DOB: 1950-12-03 Today's Date: 10/20/2014    History of Present Illness 64 y.o. male with PMH of prostate cancer, RLS, who resents result in mental status, dizziness and rashes. Patient has been having dizziness and poor balance in the past 2-3 weeks. No unilateral weakness, vision change or hearing loss. Patient recently did a lot of yard work in [place], such as cutting trees. He could have had insect bite per his wife. Patient became confused and lethargic, and was found asleep by security guard at work (works at [employer]) at about 9:30 PM. He was also found to have fever with temperature 101.2 and petechial rashes over bilateral legs. Currently presents with acute encephalopathy.    PT Comments    Patient seated in recliner and eager to participate in PT today. His HR was normalized. Patient was able to ambulate and transfer as described below. Patient will benefit from continued PT to maintain strength and ambulatory abilities while in the hospital.  Follow Up Recommendations  No PT follow up     Equipment Recommendations  None recommended by PT    Recommendations for Other Services OT consult     Precautions / Restrictions Precautions Precautions: None Restrictions Weight Bearing Restrictions: No    Mobility  Bed Mobility               General bed mobility comments: Patient found in recliner upon PT arrival  Transfers Overall transfer level: Independent Equipment used: None             General transfer comment: Patient able to stand repeatedly, used restroom independently.  Ambulation/Gait Ambulation/Gait assistance: Supervision Ambulation Distance (Feet): 220 Feet Assistive device: None Gait Pattern/deviations: Decreased stride length;Narrow base of support   Gait velocity interpretation: at or above normal speed for age/gender General Gait Details:  Patient reports being at about 85% of his baseline when it comes to walking. Able to walk easily but reported that he felt slightly unsteady, especially on uneven floors.   Stairs            Wheelchair Mobility    Modified Rankin (Stroke Patients Only)       Balance Overall balance assessment: Independent                                  Cognition Arousal/Alertness: Awake/alert Behavior During Therapy: WFL for tasks assessed/performed Overall Cognitive Status: Within Functional Limits for tasks assessed                      Exercises      General Comments        Pertinent Vitals/Pain Pain Assessment: No/denies pain  HR at rest: 80-90 bpm HR during ambulation: 101 bpm    Home Living                      Prior Function            PT Goals (current goals can now be found in the care plan section) Acute Rehab PT Goals Patient Stated Goal: Return to work. PT Goal Formulation: With patient Time For Goal Achievement: 11/03/14 Potential to Achieve Goals: Good Progress towards PT goals: Goals met and updated - see care plan    Frequency  Min 3X/week    PT Plan Current plan remains appropriate  Co-evaluation             End of Session Equipment Utilized During Treatment: Gait belt Activity Tolerance: Patient tolerated treatment well Patient left: in chair;with call bell/phone within reach;with nursing/sitter in room;with family/visitor present     Time: 5501-5868 PT Time Calculation (min) (ACUTE ONLY): 26 min  Charges:  $Gait Training: 8-22 mins $Therapeutic Activity: 8-22 mins                    G CodesRoanna Epley, SPT [phone] 10/20/2014, 1:20 PM  I have read, reviewed and agree with student's note.   [place] [phone] (pager)

## 2014-10-20 NOTE — Progress Notes (Signed)
Initial Nutrition Assessment  DOCUMENTATION CODES:   Not applicable  INTERVENTION:   Advance diet as medically appropriate, RD to add interventions accordingly  NUTRITION DIAGNOSIS:   Inadequate oral intake related to inability to eat as evidenced by NPO status  GOAL:   Patient will meet greater than or equal to 90% of their needs  MONITOR:   Diet advancement, PO intake, Labs, Weight trends, Skin  REASON FOR ASSESSMENT:   Malnutrition Screening Tool  ASSESSMENT:   64 y.o. Male with PMH of prostate cancer, RLS, who presents result in mental status, dizziness and a rash.  Pt currently in ECHO LAB.  Per Malnutrition Screening Tool Report, pt has had a decreased appetite with recent unintentional weight loss.  Currently NPO.  Previously on a Regular diet with PO intake of 50% per flowsheet records.  Would benefit from addition of oral nutrition supplements.  RD to order once able.  RD unable to complete Nutrition Focused Physical Exam at this time.  Diet Order:  Diet NPO time specified Except for: Sips with Meds  Skin:  Reviewed, no issues  Last BM:  8/25  Height:   Ht Readings from Last 1 Encounters:  10/18/14 6' (1.829 m)    Weight:   Wt Readings from Last 1 Encounters:  10/18/14 165 lb 11.2 oz (75.161 kg)    Ideal Body Weight:  81 kg  BMI:  Body mass index is 22.47 kg/(m^2).  Estimated Nutritional Needs:   Kcal:  1800-2000  Protein:  90-100 gm  Fluid:  1.8-2.0 L  EDUCATION NEEDS:   No education needs identified at this time  Arthur Holms, RD, LDN Pager #: [phone] After-Hours Pager #: [phone]

## 2014-10-20 NOTE — Progress Notes (Signed)
Patient ID: Alan [name], male   DOB: 1950/04/17, 64 y.o.   MRN: [id number]         [place] for Infectious Disease    Date of Admission:  10/17/2014           Day 2 vancomycin        Day 1 gentamicin  Principal Problem:   Enterococcal bacteremia Active Problems:   Aortic valve endocarditis   Endocarditis of mitral valve   Restless leg syndrome   Prostate cancer   Acute encephalopathy   Rash   Sepsis   Normocytic anemia   Dizziness   Acute respiratory failure   Paroxysmal atrial fibrillation   Paroxysmal atrial flutter   Atherosclerotic peripheral vascular disease   . gentamicin  80 mg Intravenous Q12H  . heparin  5,000 Units Subcutaneous 3 times per day  . pramipexole  1.5 mg Oral QHS  . sodium chloride  3 mL Intravenous Q12H  . vancomycin  1,000 mg Intravenous Q12H    SUBJECTIVE: Alan [name] blood cultures are growing Enterococcus faecalis, sensitive to ampicillin. He underwent a TEE today which showed a mitral valve vegetation and mild mitral regurgitation. There was also a large mobile vegetation on the non-coronary cusp of the aortic valve and severe aortic regurgitation.  Today, I spoke with Alan [name] about his penicillin allergy. He states it occurred 55 years ago as a child. He reports that his taste buds "got bigger". He denies being in any acute distress and did not require any intervention during this episode. This occurred while he was hospitalized for a tonsillectomy. He does not recall having any trouble breathing or requiring supplemental oxygen or rescue medications. He does not recall receiving any beta-lactam antibiotics since that time.  Dimitri Ped, PharmD. Clinical Pharmacist Resident Pager: [phone]  Review of Systems: Pertinent items are noted in HPI.  Past Medical History  Diagnosis Date  . Restless leg syndrome   . Prostate cancer     Social History  Substance Use Topics  . Smoking status: Former Research scientist (life sciences)    . Smokeless tobacco: None     Comment: Smoked a pipe age 57->28  . Alcohol Use: No     Comment: Drank from age 32->38    Family History  Problem Relation Age of Onset  . CAD Neg Hx    Allergies  Allergen Reactions  . Penicillins Swelling    Taste buds on tongue swell    OBJECTIVE: Filed Vitals:   10/20/14 1321 10/20/14 1330 10/20/14 1340 10/20/14 1427  BP: 91/41 86/43 110/42 133/85  Pulse: 126 112 102 95  Temp: 97 F (36.1 C)   97.8 F (36.6 C)  TempSrc: Oral   Oral  Resp: 19 19 19 22   Height:      Weight:      SpO2: 96% 94% 93% 91%   Body mass index is 22.47 kg/(m^2).  General: he is alert and appears comfortable. His wife is at the bedside. He still seems slightly confused. Skin: fading rash on ankles Lungs: clear Cor: regular S1 and S2 with no murmurs heard Abdomen: nontender  Lab Results Lab Results  Component Value Date   WBC 6.3 10/19/2014   HGB 8.8* 10/19/2014   HCT 27.2* 10/19/2014   MCV 84.2 10/19/2014   PLT 212 10/19/2014    Lab Results  Component Value Date   CREATININE 0.94 10/19/2014   BUN 10 10/19/2014   NA 134* 10/19/2014   K 3.7 10/19/2014   CL  106 10/19/2014   CO2 22 10/19/2014    Lab Results  Component Value Date   ALT 18 10/18/2014   AST 22 10/18/2014   ALKPHOS 40 10/18/2014   BILITOT 0.8 10/18/2014     Microbiology: Recent Results (from the past 240 hour(s))  Blood Culture (routine x 2)     Status: None   Collection Time: 10/18/14  1:11 AM  Result Value Ref Range Status   Specimen Description BLOOD RIGHT ANTECUBITAL  Final   Special Requests BOTTLES DRAWN AEROBIC AND ANAEROBIC 10CC  Final   Culture  Setup Time   Final    GRAM POSITIVE COCCI IN PAIRS IN BOTH AEROBIC AND ANAEROBIC BOTTLES CRITICAL RESULT CALLED TO, READ BACK BY AND VERIFIED WITH: P CARBORNE 10/18/14 @ 27 M VESTAL    Culture ENTEROCOCCUS SPECIES  Final   Report Status 10/20/2014 FINAL  Final   Organism ID, Bacteria ENTEROCOCCUS SPECIES  Final       Susceptibility   Enterococcus species - MIC*    AMPICILLIN <=2 SENSITIVE Sensitive     VANCOMYCIN 1 SENSITIVE Sensitive     GENTAMICIN SYNERGY SENSITIVE Sensitive     LINEZOLID 2 SENSITIVE Sensitive     * ENTEROCOCCUS SPECIES  Blood Culture (routine x 2)     Status: None   Collection Time: 10/18/14  1:19 AM  Result Value Ref Range Status   Specimen Description BLOOD RIGHT WRIST  Final   Special Requests BOTTLES DRAWN AEROBIC AND ANAEROBIC 10CC  Final   Culture  Setup Time   Final    GRAM POSITIVE COCCI IN PAIRS IN BOTH AEROBIC AND ANAEROBIC BOTTLES CRITICAL RESULT CALLED TO, READ BACK BY AND VERIFIED WITH: P CARBORNE 10/18/14 @ 56 M VESTAL    Culture   Final    ENTEROCOCCUS SPECIES SUSCEPTIBILITIES PERFORMED ON PREVIOUS CULTURE WITHIN THE LAST 5 DAYS.    Report Status 10/20/2014 FINAL  Final  Urine culture     Status: None   Collection Time: 10/18/14  2:56 AM  Result Value Ref Range Status   Specimen Description URINE, CATHETERIZED  Final   Special Requests NONE  Final   Culture 20,000 COLONIES/mL ENTEROCOCCUS FAECALIS  Final   Report Status 10/20/2014 FINAL  Final   Organism ID, Bacteria ENTEROCOCCUS FAECALIS  Final      Susceptibility   Enterococcus faecalis - MIC*    AMPICILLIN <=2 SENSITIVE Sensitive     LEVOFLOXACIN 1 SENSITIVE Sensitive     NITROFURANTOIN <=16 SENSITIVE Sensitive     VANCOMYCIN 1 SENSITIVE Sensitive     LINEZOLID 2 SENSITIVE Sensitive     * 20,000 COLONIES/mL ENTEROCOCCUS FAECALIS  MRSA PCR Screening     Status: None   Collection Time: 10/18/14  5:58 PM  Result Value Ref Range Status   MRSA by PCR NEGATIVE NEGATIVE Final    Comment:        The GeneXpert MRSA Assay (FDA approved for NASAL specimens only), is one component of a comprehensive MRSA colonization surveillance program. It is not intended to diagnose MRSA infection nor to guide or monitor treatment for MRSA infections.      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 I would suggest cardiac surgery evaluation. Given the current information he is able to recall and share with [place] about his reaction to penicillin decades ago, it does not sound like he had an IgE mediated hypersensitivity/anaphylaxis reaction. I will continue vancomycin and gentamicin for now but will continue  to probe his history as his confusion clears. I would prefer to treat him with high dose ampicillin and ceftriaxone if possible. Not only would that regimen have a much lower risk of nephrotoxicity it would also provide better CNS penetration. 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 My partner, Dr. Talbot Grumbling, will follow with you this weekend.  PLAN: 1. Continue vancomycin and gentamicin for now 2. Await results of repeat blood cultures 3. Recommend cardiac surgery evaluation  Michel Bickers, MD [facility] [place] [phone] pager   [phone] cell 10/20/2014, 4:21 PM

## 2014-10-20 NOTE — Progress Notes (Addendum)
Patient Name: Alan [name] Date of Encounter: 10/20/2014  Principal Problem:   Enterococcal bacteremia Active Problems:   Restless leg syndrome   Prostate cancer   Acute encephalopathy   Rash   Sepsis   Normocytic anemia   Dizziness   Acute respiratory failure   Aortic valve endocarditis   Endocarditis of mitral valve   Paroxysmal atrial fibrillation   Paroxysmal atrial flutter   Atherosclerotic peripheral vascular disease    SUBJECTIVE  Feeling well. Denies chest pain, sob or palpitation. Waiting for TEE.   CURRENT MEDS . heparin  5,000 Units Subcutaneous 3 times per day  . pramipexole  1.5 mg Oral QHS  . sodium chloride  3 mL Intravenous Q12H  . vancomycin  1,000 mg Intravenous Q12H    OBJECTIVE  Filed Vitals:   10/19/14 2033 10/20/14 0100 10/20/14 0108 10/20/14 0419  BP: 119/63  102/46 121/68  Pulse: 87     Temp: 97.7 F (36.5 C) 99.3 F (37.4 C)  98.1 F (36.7 C)  TempSrc: Oral Oral  Oral  Resp: 23  21 13   Height:      Weight:      SpO2: 96%  91% 96%    Intake/Output Summary (Last 24 hours) at 10/20/14 1055 Last data filed at 10/20/14 1036  Gross per 24 hour  Intake   2224 ml  Output   1400 ml  Net    824 ml   Filed Weights   10/18/14 0012 10/18/14 1241  Weight: 165 lb (74.844 kg) 165 lb 11.2 oz (75.161 kg)    PHYSICAL EXAM  General: Pleasant, NAD. Neuro: Alert and oriented X 3. Moves all extremities spontaneously. Psych: Normal affect. HEENT:  Normal  Neck: Supple without bruits or JVD. Lungs:  Resp regular and unlabored, CTA. Heart: RRR no s3, s4, or murmurs. Abdomen: Soft, non-tender, non-distended, BS + x 4.  Extremities: No clubbing, cyanosis or edema. DP/PT/Radials 2+ and equal bilaterally.  Accessory Clinical Findings  CBC  Recent Labs  10/18/14 0125 10/19/14 0310  WBC 8.9 6.3  NEUTROABS 7.0  --   HGB 9.2* 8.8*  HCT 28.4* 27.2*  MCV 84.3 84.2  PLT 231 326   Basic Metabolic Panel  Recent Labs   10/18/14 0125 10/18/14 1008 10/19/14 0310  NA 129*  --  134*  K 4.0  --  3.7  CL 100*  --  106  CO2 21*  --  22  GLUCOSE 118*  --  122*  BUN 12  --  10  CREATININE 1.20  --  0.94  CALCIUM 8.3*  --  8.4*  MG  --  1.7  --    Liver Function Tests  Recent Labs  10/18/14 0125  AST 22  ALT 18  ALKPHOS 40  BILITOT 0.8  PROT 6.4*  ALBUMIN 2.4*    Recent Labs  10/18/14 0125  LIPASE 72*     Recent Labs  10/18/14 1030  TSH 1.708    TELE  Currently in sinus rhythm, going in and out of afib.  Radiology/Studies  Dg Chest 2 View  10/18/2014   CLINICAL DATA:  Fatigue, fever, and weakness for 3 weeks. History of prostate cancer.  EXAM: CHEST  2 VIEW  COMPARISON:  08/08/2008  FINDINGS: The heart size and mediastinal contours are within normal limits. Both lungs are clear. The visualized skeletal structures are unremarkable.  IMPRESSION: No active cardiopulmonary disease.   Electronically Signed   By: Lucienne Capers M.D.   On:  10/18/2014 00:49   [name] Head Wo Contrast  10/20/2014   CLINICAL DATA:  64 year old male with altered mental status, dizziness, loss of balance. Fever, petechial rash, endocarditis. Initial encounter.  EXAM: MRI HEAD WITHOUT CONTRAST  MRA HEAD WITHOUT CONTRAST  TECHNIQUE: Multiplanar, multiecho pulse sequences of the brain and surrounding structures were obtained without intravenous contrast. Angiographic images of the head were obtained using MRA technique without contrast.  COMPARISON:  Head CT without contrast 08/08/2008.  FINDINGS: MRI HEAD FINDINGS  Wedge-shaped encephalomalacia in the anterior left middle temporal gyrus (series 9, image 19). 4-5 small foci of chronic appearing microhemorrhage, the largest in the left occipital pole on series 11, image 42. On diffusion there are scattered small foci of increased trace diffusion signal in both hemispheres. The majority year in the left MCA territory, but right MCA and right MCA/ PCA watershed territory is  also affected. Furthermore, there are several similar small foci in the left cerebellar hemisphere. Some of these demonstrate facilitated diffusion on ADC, and T2 and FLAIR hyperintensity (lesion in the posterior left corona radiata on series 5, image 37). Some are restricted.  Deep gray matter nuclei and brainstem are spared. Major intracranial vascular flow voids are preserved with mild intracranial artery dolichoectasia.  No intracranial mass effect. No ventriculomegaly. Cerebral volume is within normal limits for age. Negative pituitary, cervicomedullary junction and visualized cervical spine.  Visible internal auditory structures appear normal. Trace left mastoid effusion. Paranasal sinuses and right mastoid are clear. Negative orbit and scalp.  Nonspecific decreased bone marrow signal at the skullbase and in the visualized cervical spine, patchy geographic pattern. More normal marrow signal in the calvarium.  MRA HEAD FINDINGS  Antegrade flow in the posterior circulation with dominant distal left vertebral artery. Mild distal left vertebral artery atherosclerosis without significant stenosis. Patent vertebrobasilar junction. Normal left PICA. Dominant appearing right AICA. No basilar artery stenosis. Normal SCA and PCA origins. Small posterior communicating arteries. Normal bilateral PCA branches.  Antegrade flow in both ICA siphons. Evidence of calcified plaque without siphon stenosis. Tortuous but otherwise normal ophthalmic artery origins, more so the left. Patent carotid termini. Normal MCA and ACA origins. Normal posterior communicating artery origins.  Tortuous proximal ACA is. Diminutive or absent anterior communicating artery. Visualized bilateral ACA branches are within normal limits. Mildly tortuous bilateral MCAs. No MCA stenosis or branch occlusion identified. Overall mild generalized intracranial artery dolichoectasia.  IMPRESSION: 1. Scattered small acute to subacute, and occasional early  chronic, lacunar infarcts in both MCA territories and the left cerebellum. None of these appear hemorrhagic (cerebral septic emboli are prone to bleed), but consider sequelae of endocarditis and septic emboli in this setting. No significant edema or mass effect. 2. Chronic anterior division left MCA infarct. A small number of chronic micro hemorrhages are identified in both hemispheres an the left cerebellum. 3. Mild for age intracranial atherosclerosis with no stenosis or branch occlusion identified. Mild intracranial artery dolichoectasia.   Electronically Signed   By: Genevie Ann M.D.   On: 10/20/2014 09:49   Mr [name] Contrast  10/20/2014   CLINICAL DATA:  64 year old male with altered mental status, dizziness, loss of balance. Fever, petechial rash, endocarditis. Initial encounter.  EXAM: MRI HEAD WITHOUT CONTRAST  MRA HEAD WITHOUT CONTRAST  TECHNIQUE: Multiplanar, multiecho pulse sequences of the brain and surrounding structures were obtained without intravenous contrast. Angiographic images of the head were obtained using MRA technique without contrast.  COMPARISON:  Head CT without contrast 08/08/2008.  FINDINGS: MRI  HEAD FINDINGS  Wedge-shaped encephalomalacia in the anterior left middle temporal gyrus (series 9, image 19). 4-5 small foci of chronic appearing microhemorrhage, the largest in the left occipital pole on series 11, image 42. On diffusion there are scattered small foci of increased trace diffusion signal in both hemispheres. The majority year in the left MCA territory, but right MCA and right MCA/ PCA watershed territory is also affected. Furthermore, there are several similar small foci in the left cerebellar hemisphere. Some of these demonstrate facilitated diffusion on ADC, and T2 and FLAIR hyperintensity (lesion in the posterior left corona radiata on series 5, image 37). Some are restricted.  Deep gray matter nuclei and brainstem are spared. Major intracranial vascular flow voids are  preserved with mild intracranial artery dolichoectasia.  No intracranial mass effect. No ventriculomegaly. Cerebral volume is within normal limits for age. Negative pituitary, cervicomedullary junction and visualized cervical spine.  Visible internal auditory structures appear normal. Trace left mastoid effusion. Paranasal sinuses and right mastoid are clear. Negative orbit and scalp.  Nonspecific decreased bone marrow signal at the skullbase and in the visualized cervical spine, patchy geographic pattern. More normal marrow signal in the calvarium.  MRA HEAD FINDINGS  Antegrade flow in the posterior circulation with dominant distal left vertebral artery. Mild distal left vertebral artery atherosclerosis without significant stenosis. Patent vertebrobasilar junction. Normal left PICA. Dominant appearing right AICA. No basilar artery stenosis. Normal SCA and PCA origins. Small posterior communicating arteries. Normal bilateral PCA branches.  Antegrade flow in both ICA siphons. Evidence of calcified plaque without siphon stenosis. Tortuous but otherwise normal ophthalmic artery origins, more so the left. Patent carotid termini. Normal MCA and ACA origins. Normal posterior communicating artery origins.  Tortuous proximal ACA is. Diminutive or absent anterior communicating artery. Visualized bilateral ACA branches are within normal limits. Mildly tortuous bilateral MCAs. No MCA stenosis or branch occlusion identified. Overall mild generalized intracranial artery dolichoectasia.  IMPRESSION: 1. Scattered small acute to subacute, and occasional early chronic, lacunar infarcts in both MCA territories and the left cerebellum. None of these appear hemorrhagic (cerebral septic emboli are prone to bleed), but consider sequelae of endocarditis and septic emboli in this setting. No significant edema or mass effect. 2. Chronic anterior division left MCA infarct. A small number of chronic micro hemorrhages are identified in both  hemispheres an the left cerebellum. 3. Mild for age intracranial atherosclerosis with no stenosis or branch occlusion identified. Mild intracranial artery dolichoectasia.   Electronically Signed   By: Genevie Ann M.D.   On: 10/20/2014 09:49    ASSESSMENT AND PLAN  1. Acute encephalopathy and fever with + enterococcus by 2/2 blood cultures and probable endocarditis - Presenting symptoms are likely due to subacute bacterial endocarditis. ID is on board, currently treating with vancomycin.  TEE today. - MRA of brian showed - 1. Scattered small acute to subacute, and occasional early chronic, lacunar infarcts in both MCA territories and the left cerebellum. None of these appear hemorrhagic (cerebral septic emboli are prone to bleed), but consider sequelae of endocarditis and septic emboli in this setting. No significant edema or mass effect. 2. Chronic anterior division left MCA infarct. A small number of chronic micro hemorrhages are identified in both hemispheres an the left cerebellum. 3. Mild for age intracranial atherosclerosis with no stenosis or branch occlusion identified. Mild intracranial artery dolichoectasia.   - Consider GI workup for source of bacteremia. Lipase of 72.  2. Paroxysmal atrial fibrillation and paroxysmal atrial flutter, newly recognized -  Going in and out. Currently in sinus rhythm at ventricular rate of 80s.  - TSH normal. HgbA1c is pending. - CHADSVASC now 2. (age >71 and stroke). No anticoagulation for now.   3. Anemia of unknown chronicity, possibly related to above process - Hemoglobin of 8.8 today, was 9.2 on 8/24. Monitor closely.  4. Hyponatremia, improved 5. Recent 20lb weight loss likely due to the above 6. Diarrhea, C diff being ruled out   Signed, Bhagat,Bhavinkumar PA-C Pager [phone]  I have seen and examined the patient along with Bhagat,Bhavinkumar PA-C.  I have reviewed the chart, notes and new data.  I agree with PA's note.  Key new complaints:  none Key examination changes: no change in cardiac murmur since yesterday, no signs of embolic events on exam Key new findings / data: MRI/MRA results reviewed. No mycotic aneurysms or cerebral abscess, but suspicious signs of recent septic emboli.  PLAN: TEE today.  At this point, the risks of anticoagulation exceed potential benefit. Technically, CHADSVasc score 3, but it appears more likely that his infarcts are endocarditis related, rather than AFib related. Episodes of arrhythmia remain brief and asymptomatic, no need for antiarrhythmics yet.  Sanda Klein, MD, [place] [phone] 10/20/2014, 11:34 AM

## 2014-10-21 ENCOUNTER — Inpatient Hospital Stay (HOSPITAL_COMMUNITY): Payer: BC Managed Care – PPO

## 2014-10-21 DIAGNOSIS — I351 Nonrheumatic aortic (valve) insufficiency: Secondary | ICD-10-CM

## 2014-10-21 DIAGNOSIS — Z880 Allergy status to penicillin: Secondary | ICD-10-CM

## 2014-10-21 DIAGNOSIS — I080 Rheumatic disorders of both mitral and aortic valves: Secondary | ICD-10-CM

## 2014-10-21 LAB — COMPREHENSIVE METABOLIC PANEL
ALT: 28 U/L (ref 17–63)
AST: 49 U/L — AB (ref 15–41)
Albumin: 1.9 g/dL — ABNORMAL LOW (ref 3.5–5.0)
Alkaline Phosphatase: 85 U/L (ref 38–126)
Anion gap: 5 (ref 5–15)
BILIRUBIN TOTAL: 0.5 mg/dL (ref 0.3–1.2)
BUN: 6 mg/dL (ref 6–20)
CALCIUM: 8.5 mg/dL — AB (ref 8.9–10.3)
CO2: 28 mmol/L (ref 22–32)
CREATININE: 1.08 mg/dL (ref 0.61–1.24)
Chloride: 100 mmol/L — ABNORMAL LOW (ref 101–111)
GFR calc Af Amer: >60 mL/min (ref >60)
Glucose, Bld: 119 mg/dL — ABNORMAL HIGH (ref 65–99)
Potassium: 4.3 mmol/L (ref 3.5–5.1)
Sodium: 133 mmol/L — ABNORMAL LOW (ref 135–145)
TOTAL PROTEIN: 5.7 g/dL — AB (ref 6.5–8.1)

## 2014-10-21 LAB — BASIC METABOLIC PANEL
Anion gap: 5 (ref 5–15)
BUN: 5 mg/dL — AB (ref 6–20)
CO2: 26 mmol/L (ref 22–32)
CREATININE: 0.94 mg/dL (ref 0.61–1.24)
Calcium: 8.4 mg/dL — ABNORMAL LOW (ref 8.9–10.3)
Chloride: 103 mmol/L (ref 101–111)
Glucose, Bld: 109 mg/dL — ABNORMAL HIGH (ref 65–99)
Potassium: 4.3 mmol/L (ref 3.5–5.1)
SODIUM: 134 mmol/L — AB (ref 135–145)

## 2014-10-21 LAB — CBC
HCT: 30.9 % — ABNORMAL LOW (ref 39.0–52.0)
Hemoglobin: 10 g/dL — ABNORMAL LOW (ref 13.0–17.0)
MCH: 26.9 pg (ref 26.0–34.0)
MCHC: 32.4 g/dL (ref 30.0–36.0)
MCV: 83.1 fL (ref 78.0–100.0)
PLATELETS: 302 10*3/uL (ref 150–400)
RBC: 3.72 MIL/uL — AB (ref 4.22–5.81)
RDW: 14.9 % (ref 11.5–15.5)
WBC: 11.6 10*3/uL — ABNORMAL HIGH (ref 4.0–10.5)

## 2014-10-21 LAB — PHOSPHORUS: Phosphorus: 3.4 mg/dL (ref 2.5–4.6)

## 2014-10-21 LAB — CBC WITH DIFFERENTIAL/PLATELET
BASOS ABS: 0 10*3/uL (ref 0.0–0.1)
Basophils Relative: 0 % (ref 0–1)
Eosinophils Absolute: 0.1 10*3/uL (ref 0.0–0.7)
Eosinophils Relative: 0 % (ref 0–5)
HEMATOCRIT: 31 % — AB (ref 39.0–52.0)
Hemoglobin: 10.3 g/dL — ABNORMAL LOW (ref 13.0–17.0)
LYMPHS PCT: 12 % (ref 12–46)
Lymphs Abs: 1.7 10*3/uL (ref 0.7–4.0)
MCH: 27.9 pg (ref 26.0–34.0)
MCHC: 33.2 g/dL (ref 30.0–36.0)
MCV: 84 fL (ref 78.0–100.0)
MONO ABS: 1.8 10*3/uL — AB (ref 0.1–1.0)
Monocytes Relative: 12 % (ref 3–12)
NEUTROS ABS: 10.7 10*3/uL — AB (ref 1.7–7.7)
Neutrophils Relative %: 76 % (ref 43–77)
Platelets: 294 10*3/uL (ref 150–400)
RBC: 3.69 MIL/uL — AB (ref 4.22–5.81)
RDW: 15.2 % (ref 11.5–15.5)
WBC: 14.3 10*3/uL — ABNORMAL HIGH (ref 4.0–10.5)

## 2014-10-21 LAB — GI PATHOGEN PANEL BY PCR, STOOL
C difficile toxin A/B: NOT DETECTED
CRYPTOSPORIDIUM BY PCR: NOT DETECTED
Campylobacter by PCR: NOT DETECTED
E COLI (STEC): NOT DETECTED
E coli (ETEC) LT/ST: NOT DETECTED
E coli 0157 by PCR: NOT DETECTED
G LAMBLIA BY PCR: NOT DETECTED
NOROVIRUS G1/G2: NOT DETECTED
ROTAVIRUS A BY PCR: NOT DETECTED
SHIGELLA BY PCR: NOT DETECTED
Salmonella by PCR: NOT DETECTED

## 2014-10-21 LAB — VANCOMYCIN, TROUGH: VANCOMYCIN TR: 16 ug/mL (ref 10.0–20.0)

## 2014-10-21 LAB — HEMOGLOBIN A1C
HEMOGLOBIN A1C: 6.4 % — AB (ref 4.8–5.6)
MEAN PLASMA GLUCOSE: 137 mg/dL

## 2014-10-21 LAB — MAGNESIUM: Magnesium: 1.7 mg/dL (ref 1.7–2.4)

## 2014-10-21 LAB — GLUCOSE, CAPILLARY: Glucose-Capillary: 100 mg/dL — ABNORMAL HIGH (ref 65–99)

## 2014-10-21 LAB — TROPONIN I: TROPONIN I: 0.28 ng/mL — AB (ref <0.031)

## 2014-10-21 MED ORDER — DILTIAZEM HCL 25 MG/5ML IV SOLN
10.0000 mg | Freq: Once | INTRAVENOUS | Status: AC
  Administered 2014-10-21: 10 mg via INTRAVENOUS
  Filled 2014-10-21: qty 5

## 2014-10-21 MED ORDER — CHLORHEXIDINE GLUCONATE 0.12 % MT SOLN
15.0000 mL | Freq: Two times a day (BID) | OROMUCOSAL | Status: DC
  Administered 2014-10-21 – 2014-10-22 (×3): 15 mL via OROMUCOSAL
  Filled 2014-10-21 (×4): qty 15

## 2014-10-21 MED ORDER — OXYCODONE HCL 5 MG PO TABS
5.0000 mg | ORAL_TABLET | Freq: Four times a day (QID) | ORAL | Status: DC | PRN
  Administered 2014-10-21 – 2014-10-31 (×7): 5 mg via ORAL
  Filled 2014-10-21 (×7): qty 1

## 2014-10-21 NOTE — Progress Notes (Addendum)
PROGRESS NOTE  Alan [name] XTK:[id number] DOB: 09-Nov-1950 DOA: 10/17/2014 PCP: [name], MD  Alan [name] is a 64 y.o. male with PMH of prostate cancer, RLS, who resents result in mental status, dizziness and a rash. Per patient's wife, pt just hasn't felt well lately. Patient has been having dizziness and poor balance in the past 2-3 weeks. No unilateral weakness, vision change or hearing loss. Patient recently did a lot of yard work in [employer], such as cutting trees. He could have had insect bite per his wife. Today patient become confused and lethargic, and was found asleep by security guard at work (works at [employer]) at about 9:30 PM.  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  Acute resp failure - wean O2  Diarrhea -GI pathogen panel pending  Sepsis: resolved -+ blood cultures  Restless leg syndrome: -Pramipexole  Normocytic anemia: hgb 15.7 on 2010-->9.2. FOBT negative. -check anemia panel  Dizziness:  -see MRI  Code Status: full Family Communication: patient/  Wife 8/26 Disposition Plan: remain in SDU   Consultants:    Procedures:     HPI/Subjective: tired  Objective: Filed Vitals:   10/21/14 0431  BP: 144/79  Pulse: 116  Temp: 100.1 F (37.8 C)  Resp: 26    Intake/Output Summary (Last 24 hours) at 10/21/14 0813 Last data filed at 10/21/14 0636   Gross per 24 hour  Intake   2193 ml  Output    700 ml  Net   1493 ml   Filed Weights   10/18/14 0012 10/18/14 1241  Weight: 74.844 kg (165 lb) 75.161 kg (165 lb 11.2 oz)    Exam:   General:   NAD  Cardiovascular: rrr  Respiratory: clear  Abdomen: +BS, soft  Musculoskeletal: no edema  Data Reviewed: Basic Metabolic Panel:  Recent Labs Lab 10/18/14 0125 10/18/14 1008 10/19/14 0310 10/21/14 0236  NA 129*  --  134* 134*  K 4.0  --  3.7 4.3  CL 100*  --  106 103  CO2 21*  --  22 26  GLUCOSE 118*  --  122* 109*  BUN 12  --  10 5*  CREATININE 1.20  --  0.94 0.94  CALCIUM 8.3*  --  8.4* 8.4*  MG  --  1.7  --   --    Liver Function Tests:  Recent Labs Lab 10/18/14 0125  AST 22  ALT 18  ALKPHOS 40  BILITOT 0.8  PROT 6.4*  ALBUMIN 2.4*    Recent Labs Lab 10/18/14 0125  LIPASE 72*   No results for input(s): AMMONIA in the last 168 hours. CBC:  Recent Labs Lab 10/18/14 0125 10/19/14 0310 10/21/14 0236  WBC 8.9 6.3 11.6*  NEUTROABS 7.0  --   --   HGB 9.2* 8.8* 10.0*  HCT 28.4* 27.2* 30.9*  MCV 84.3 84.2 83.1  PLT 231 212 302   Cardiac Enzymes: No results for input(s): CKTOTAL, CKMB, CKMBINDEX, TROPONINI in the last 168  hours. BNP (last 3 results) No results for input(s): BNP in the last 8760 hours.  ProBNP (last 3 results) No results for input(s): PROBNP in the last 8760 hours.  CBG:  Recent Labs Lab 10/19/14 0811 10/20/14 0810  GLUCAP 109* 99    Recent Results (from the past 240 hour(s))  Blood Culture (routine x 2)     Status: None   Collection Time: 10/18/14  1:11 AM  Result Value Ref Range Status   Specimen Description BLOOD RIGHT ANTECUBITAL  Final   Special Requests BOTTLES DRAWN AEROBIC AND ANAEROBIC 10CC  Final   Culture  Setup Time   Final    GRAM POSITIVE COCCI IN PAIRS IN BOTH AEROBIC AND ANAEROBIC BOTTLES CRITICAL RESULT CALLED TO, READ BACK BY AND VERIFIED WITH: P CARBORNE 10/18/14 @ 41 M VESTAL    Culture  ENTEROCOCCUS SPECIES  Final   Report Status 10/20/2014 FINAL  Final   Organism ID, Bacteria ENTEROCOCCUS SPECIES  Final      Susceptibility   Enterococcus species - MIC*    AMPICILLIN <=2 SENSITIVE Sensitive     VANCOMYCIN 1 SENSITIVE Sensitive     GENTAMICIN SYNERGY SENSITIVE Sensitive     LINEZOLID 2 SENSITIVE Sensitive     * ENTEROCOCCUS SPECIES  Blood Culture (routine x 2)     Status: None   Collection Time: 10/18/14  1:19 AM  Result Value Ref Range Status   Specimen Description BLOOD RIGHT WRIST  Final   Special Requests BOTTLES DRAWN AEROBIC AND ANAEROBIC 10CC  Final   Culture  Setup Time   Final    GRAM POSITIVE COCCI IN PAIRS IN BOTH AEROBIC AND ANAEROBIC BOTTLES CRITICAL RESULT CALLED TO, READ BACK BY AND VERIFIED WITH: P CARBORNE 10/18/14 @ 77 M VESTAL    Culture   Final    ENTEROCOCCUS SPECIES SUSCEPTIBILITIES PERFORMED ON PREVIOUS CULTURE WITHIN THE LAST 5 DAYS.    Report Status 10/20/2014 FINAL  Final  Urine culture     Status: None   Collection Time: 10/18/14  2:56 AM  Result Value Ref Range Status   Specimen Description URINE, CATHETERIZED  Final   Special Requests NONE  Final   Culture 20,000 COLONIES/mL ENTEROCOCCUS FAECALIS  Final   Report Status 10/20/2014 FINAL  Final   Organism ID, Bacteria ENTEROCOCCUS FAECALIS  Final      Susceptibility   Enterococcus faecalis - MIC*    AMPICILLIN <=2 SENSITIVE Sensitive     LEVOFLOXACIN 1 SENSITIVE Sensitive     NITROFURANTOIN <=16 SENSITIVE Sensitive     VANCOMYCIN 1 SENSITIVE Sensitive     LINEZOLID 2 SENSITIVE Sensitive     * 20,000 COLONIES/mL ENTEROCOCCUS FAECALIS  MRSA PCR Screening     Status: None   Collection Time: 10/18/14  5:58 PM  Result Value Ref Range Status   MRSA by PCR NEGATIVE NEGATIVE Final    Comment:        The GeneXpert MRSA Assay (FDA approved for NASAL specimens only), is one component of a comprehensive MRSA colonization surveillance program. It is not intended to diagnose  MRSA infection nor to guide or monitor treatment for MRSA infections.      Studies: Mr Virgel Paling Wo Contrast  10/20/2014   CLINICAL DATA:  64 year old male with altered mental status, dizziness, loss of balance. Fever, petechial rash, endocarditis. Initial encounter.  EXAM: MRI HEAD WITHOUT CONTRAST  MRA HEAD WITHOUT CONTRAST  TECHNIQUE: Multiplanar, multiecho pulse sequences of the brain and surrounding structures were obtained without  intravenous contrast. Angiographic images of the head were obtained using MRA technique without contrast.  COMPARISON:  Head CT without contrast 08/08/2008.  FINDINGS: MRI HEAD FINDINGS  Wedge-shaped encephalomalacia in the anterior left middle temporal gyrus (series 9, image 19). 4-5 small foci of chronic appearing microhemorrhage, the largest in the left occipital pole on series 11, image 42. On diffusion there are scattered small foci of increased trace diffusion signal in both hemispheres. The majority year in the left MCA territory, but right MCA and right MCA/ PCA watershed territory is also affected. Furthermore, there are several similar small foci in the left cerebellar hemisphere. Some of these demonstrate facilitated diffusion on ADC, and T2 and FLAIR hyperintensity (lesion in the posterior left corona radiata on series 5, image 37). Some are restricted.  Deep gray matter nuclei and brainstem are spared. Major intracranial vascular flow voids are preserved with mild intracranial artery dolichoectasia.  No intracranial mass effect. No ventriculomegaly. Cerebral volume is within normal limits for age. Negative pituitary, cervicomedullary junction and visualized cervical spine.  Visible internal auditory structures appear normal. Trace left mastoid effusion. Paranasal sinuses and right mastoid are clear. Negative orbit and scalp.  Nonspecific decreased bone marrow signal at the skullbase and in the visualized cervical spine, patchy geographic pattern. More normal  marrow signal in the calvarium.  MRA HEAD FINDINGS  Antegrade flow in the posterior circulation with dominant distal left vertebral artery. Mild distal left vertebral artery atherosclerosis without significant stenosis. Patent vertebrobasilar junction. Normal left PICA. Dominant appearing right AICA. No basilar artery stenosis. Normal SCA and PCA origins. Small posterior communicating arteries. Normal bilateral PCA branches.  Antegrade flow in both ICA siphons. Evidence of calcified plaque without siphon stenosis. Tortuous but otherwise normal ophthalmic artery origins, more so the left. Patent carotid termini. Normal MCA and ACA origins. Normal posterior communicating artery origins.  Tortuous proximal ACA is. Diminutive or absent anterior communicating artery. Visualized bilateral ACA branches are within normal limits. Mildly tortuous bilateral MCAs. No MCA stenosis or branch occlusion identified. Overall mild generalized intracranial artery dolichoectasia.  IMPRESSION: 1. Scattered small acute to subacute, and occasional early chronic, lacunar infarcts in both MCA territories and the left cerebellum. None of these appear hemorrhagic (cerebral septic emboli are prone to bleed), but consider sequelae of endocarditis and septic emboli in this setting. No significant edema or mass effect. 2. Chronic anterior division left MCA infarct. A small number of chronic micro hemorrhages are identified in both hemispheres an the left cerebellum. 3. Mild for age intracranial atherosclerosis with no stenosis or branch occlusion identified. Mild intracranial artery dolichoectasia.   Electronically Signed   By: Genevie Ann M.D.   On: 10/20/2014 09:49   Mr [name] Contrast  10/20/2014   CLINICAL DATA:  64 year old male with altered mental status, dizziness, loss of balance. Fever, petechial rash, endocarditis. Initial encounter.  EXAM: MRI HEAD WITHOUT CONTRAST  MRA HEAD WITHOUT CONTRAST  TECHNIQUE: Multiplanar, multiecho pulse  sequences of the brain and surrounding structures were obtained without intravenous contrast. Angiographic images of the head were obtained using MRA technique without contrast.  COMPARISON:  Head CT without contrast 08/08/2008.  FINDINGS: MRI HEAD FINDINGS  Wedge-shaped encephalomalacia in the anterior left middle temporal gyrus (series 9, image 19). 4-5 small foci of chronic appearing microhemorrhage, the largest in the left occipital pole on series 11, image 42. On diffusion there are scattered small foci of increased trace diffusion signal in both hemispheres. The majority year in the left MCA territory,  but right MCA and right MCA/ PCA watershed territory is also affected. Furthermore, there are several similar small foci in the left cerebellar hemisphere. Some of these demonstrate facilitated diffusion on ADC, and T2 and FLAIR hyperintensity (lesion in the posterior left corona radiata on series 5, image 37). Some are restricted.  Deep gray matter nuclei and brainstem are spared. Major intracranial vascular flow voids are preserved with mild intracranial artery dolichoectasia.  No intracranial mass effect. No ventriculomegaly. Cerebral volume is within normal limits for age. Negative pituitary, cervicomedullary junction and visualized cervical spine.  Visible internal auditory structures appear normal. Trace left mastoid effusion. Paranasal sinuses and right mastoid are clear. Negative orbit and scalp.  Nonspecific decreased bone marrow signal at the skullbase and in the visualized cervical spine, patchy geographic pattern. More normal marrow signal in the calvarium.  MRA HEAD FINDINGS  Antegrade flow in the posterior circulation with dominant distal left vertebral artery. Mild distal left vertebral artery atherosclerosis without significant stenosis. Patent vertebrobasilar junction. Normal left PICA. Dominant appearing right AICA. No basilar artery stenosis. Normal SCA and PCA origins. Small posterior  communicating arteries. Normal bilateral PCA branches.  Antegrade flow in both ICA siphons. Evidence of calcified plaque without siphon stenosis. Tortuous but otherwise normal ophthalmic artery origins, more so the left. Patent carotid termini. Normal MCA and ACA origins. Normal posterior communicating artery origins.  Tortuous proximal ACA is. Diminutive or absent anterior communicating artery. Visualized bilateral ACA branches are within normal limits. Mildly tortuous bilateral MCAs. No MCA stenosis or branch occlusion identified. Overall mild generalized intracranial artery dolichoectasia.  IMPRESSION: 1. Scattered small acute to subacute, and occasional early chronic, lacunar infarcts in both MCA territories and the left cerebellum. None of these appear hemorrhagic (cerebral septic emboli are prone to bleed), but consider sequelae of endocarditis and septic emboli in this setting. No significant edema or mass effect. 2. Chronic anterior division left MCA infarct. A small number of chronic micro hemorrhages are identified in both hemispheres an the left cerebellum. 3. Mild for age intracranial atherosclerosis with no stenosis or branch occlusion identified. Mild intracranial artery dolichoectasia.   Electronically Signed   By: Genevie Ann M.D.   On: 10/20/2014 09:49    Scheduled Meds: . chlorhexidine  15 mL Mouth/Throat BID  . gentamicin  80 mg Intravenous Q12H  . heparin  5,000 Units Subcutaneous 3 times per day  . pramipexole  1.5 mg Oral QHS  . sodium chloride  3 mL Intravenous Q12H  . vancomycin  1,000 mg Intravenous Q12H   Continuous Infusions: . sodium chloride 100 mL/hr at 10/21/14 0803   Antibiotics Given (last 72 hours)    Date/Time Action Medication Dose Rate   10/18/14 1700 Given   cefTRIAXone (ROCEPHIN) 2 g in dextrose 5 % 50 mL IVPB 2 g 100 mL/hr   10/18/14 1728 Given   doxycycline (VIBRAMYCIN) 100 mg in dextrose 5 % 250 mL IVPB 100 mg 125 mL/hr   10/19/14 0549 Given   doxycycline  (VIBRAMYCIN) 100 mg in dextrose 5 % 250 mL IVPB 100 mg 125 mL/hr   10/19/14 0902 Given   doxycycline (VIBRAMYCIN) 100 mg in dextrose 5 % 250 mL IVPB 100 mg 125 mL/hr   10/19/14 0903 Given   vancomycin (VANCOCIN) 1,500 mg in sodium chloride 0.9 % 500 mL IVPB 1,500 mg 250 mL/hr   10/19/14 2200 Given   vancomycin (VANCOCIN) IVPB 1000 mg/200 mL premix 1,000 mg 200 mL/hr   10/20/14 1036 Given   vancomycin (VANCOCIN)  IVPB 1000 mg/200 mL premix 1,000 mg 200 mL/hr   10/20/14 1808 Given   gentamicin (GARAMYCIN) IVPB 80 mg 80 mg 100 mL/hr   10/20/14 2138 Given   vancomycin (VANCOCIN) IVPB 1000 mg/200 mL premix 1,000 mg 200 mL/hr   10/21/14 0636 Given   gentamicin (GARAMYCIN) IVPB 80 mg 80 mg 100 mL/hr      Principal Problem:   Enterococcal bacteremia Active Problems:   Restless leg syndrome   Prostate cancer   Acute encephalopathy   Rash   Sepsis   Normocytic anemia   Dizziness   Acute respiratory failure   Aortic valve endocarditis - Enterococcus   Endocarditis of mitral valve - Enterococcus   Paroxysmal atrial fibrillation   Paroxysmal atrial flutter   Atherosclerotic peripheral vascular disease   Aortic valve insufficiency, severe   Mitral valve regurgitation, infectious    Time spent: 35 min    Alan [name]  [facility] Pager [phone]. If 7PM-7AM, please contact night-coverage at [email or web address], password [facility] 10/21/2014, 8:13 AM  LOS: 3 days

## 2014-10-21 NOTE — Progress Notes (Signed)
[facility]  Date of Admission:  10/17/2014  Antibiotics: vanco/gent  Subjective: No complaints  Objective: Temp:  [97 F (36.1 C)-101.3 F (38.5 C)] 101.3 F (38.5 C) (08/27 1221) Pulse Rate:  [65-140] 114 (08/27 1221) Resp:  [17-45] 23 (08/27 1221) BP: (86-145)/(37-85) 129/66 mmHg (08/27 1221) SpO2:  [89 %-98 %] 94 % (08/27 0100)  General: awake, alert, nad Skin: no rashes Lungs: CTA B Cor: RRR Abdomen: soft, nt, nd Ext: no edema  Lab Results Lab Results  Component Value Date   WBC 11.6* 10/21/2014   HGB 10.0* 10/21/2014   HCT 30.9* 10/21/2014   MCV 83.1 10/21/2014   PLT 302 10/21/2014    Lab Results  Component Value Date   CREATININE 0.94 10/21/2014   BUN 5* 10/21/2014   NA 134* 10/21/2014   K 4.3 10/21/2014   CL 103 10/21/2014   CO2 26 10/21/2014    Lab Results  Component Value Date   ALT 18 10/18/2014   AST 22 10/18/2014   ALKPHOS 40 10/18/2014   BILITOT 0.8 10/18/2014      Microbiology: Recent Results (from the past 240 hour(s))  Blood Culture (routine x 2)     Status: None   Collection Time: 10/18/14  1:11 AM  Result Value Ref Range Status   Specimen Description BLOOD RIGHT ANTECUBITAL  Final   Special Requests BOTTLES DRAWN AEROBIC AND ANAEROBIC 10CC  Final   Culture  Setup Time   Final    GRAM POSITIVE COCCI IN PAIRS IN BOTH AEROBIC AND ANAEROBIC BOTTLES CRITICAL RESULT CALLED TO, READ BACK BY AND VERIFIED WITH: P CARBORNE 10/18/14 @ 7 M VESTAL    Culture ENTEROCOCCUS SPECIES  Final   Report Status 10/20/2014 FINAL  Final   Organism ID, Bacteria ENTEROCOCCUS SPECIES  Final      Susceptibility   Enterococcus species - MIC*    AMPICILLIN <=2 SENSITIVE Sensitive     VANCOMYCIN 1 SENSITIVE Sensitive     GENTAMICIN SYNERGY SENSITIVE Sensitive     LINEZOLID 2 SENSITIVE Sensitive     * ENTEROCOCCUS SPECIES  Blood Culture (routine x 2)     Status: None   Collection Time: 10/18/14  1:19 AM  Result Value Ref  Range Status   Specimen Description BLOOD RIGHT WRIST  Final   Special Requests BOTTLES DRAWN AEROBIC AND ANAEROBIC 10CC  Final   Culture  Setup Time   Final    GRAM POSITIVE COCCI IN PAIRS IN BOTH AEROBIC AND ANAEROBIC BOTTLES CRITICAL RESULT CALLED TO, READ BACK BY AND VERIFIED WITH: P CARBORNE 10/18/14 @ 54 M VESTAL    Culture   Final    ENTEROCOCCUS SPECIES SUSCEPTIBILITIES PERFORMED ON PREVIOUS CULTURE WITHIN THE LAST 5 DAYS.    Report Status 10/20/2014 FINAL  Final  Urine culture     Status: None   Collection Time: 10/18/14  2:56 AM  Result Value Ref Range Status   Specimen Description URINE, CATHETERIZED  Final   Special Requests NONE  Final   Culture 20,000 COLONIES/mL ENTEROCOCCUS FAECALIS  Final   Report Status 10/20/2014 FINAL  Final   Organism ID, Bacteria ENTEROCOCCUS FAECALIS  Final      Susceptibility   Enterococcus faecalis - MIC*    AMPICILLIN <=2 SENSITIVE Sensitive     LEVOFLOXACIN 1 SENSITIVE Sensitive     NITROFURANTOIN <=16 SENSITIVE Sensitive     VANCOMYCIN 1 SENSITIVE Sensitive     LINEZOLID 2 SENSITIVE Sensitive     *  20,000 COLONIES/mL ENTEROCOCCUS FAECALIS  MRSA PCR Screening     Status: None   Collection Time: 10/18/14  5:58 PM  Result Value Ref Range Status   MRSA by PCR NEGATIVE NEGATIVE Final    Comment:        The GeneXpert MRSA Assay (FDA approved for NASAL specimens only), is one component of a comprehensive MRSA colonization surveillance program. It is not intended to diagnose MRSA infection nor to guide or monitor treatment for MRSA infections.     Studies/Results: Dg Orthopantogram  10/21/2014   CLINICAL DATA:  Dental anomaly.  Endocarditis.  EXAM: ORTHOPANTOGRAM/PANORAMIC  COMPARISON:  None.  FINDINGS: No fracture or bone lesion.  There are multiple metal fillings. Dentition is otherwise unremarkable with no periapical lucency to suggest a dental abscess. All third molars have been removed. Second and third molar have been removed  the right axilla.  IMPRESSION: 1. No fracture or bone lesion. No evidence of a dental abscess or untreated carie.   Electronically Signed   By: Lajean Manes M.D.   On: 10/21/2014 10:09   [name] Head Wo Contrast  10/20/2014   CLINICAL DATA:  64 year old male with altered mental status, dizziness, loss of balance. Fever, petechial rash, endocarditis. Initial encounter.  EXAM: MRI HEAD WITHOUT CONTRAST  MRA HEAD WITHOUT CONTRAST  TECHNIQUE: Multiplanar, multiecho pulse sequences of the brain and surrounding structures were obtained without intravenous contrast. Angiographic images of the head were obtained using MRA technique without contrast.  COMPARISON:  Head CT without contrast 08/08/2008.  FINDINGS: MRI HEAD FINDINGS  Wedge-shaped encephalomalacia in the anterior left middle temporal gyrus (series 9, image 19). 4-5 small foci of chronic appearing microhemorrhage, the largest in the left occipital pole on series 11, image 42. On diffusion there are scattered small foci of increased trace diffusion signal in both hemispheres. The majority year in the left MCA territory, but right MCA and right MCA/ PCA watershed territory is also affected. Furthermore, there are several similar small foci in the left cerebellar hemisphere. Some of these demonstrate facilitated diffusion on ADC, and T2 and FLAIR hyperintensity (lesion in the posterior left corona radiata on series 5, image 37). Some are restricted.  Deep gray matter nuclei and brainstem are spared. Major intracranial vascular flow voids are preserved with mild intracranial artery dolichoectasia.  No intracranial mass effect. No ventriculomegaly. Cerebral volume is within normal limits for age. Negative pituitary, cervicomedullary junction and visualized cervical spine.  Visible internal auditory structures appear normal. Trace left mastoid effusion. Paranasal sinuses and right mastoid are clear. Negative orbit and scalp.  Nonspecific decreased bone marrow signal  at the skullbase and in the visualized cervical spine, patchy geographic pattern. More normal marrow signal in the calvarium.  MRA HEAD FINDINGS  Antegrade flow in the posterior circulation with dominant distal left vertebral artery. Mild distal left vertebral artery atherosclerosis without significant stenosis. Patent vertebrobasilar junction. Normal left PICA. Dominant appearing right AICA. No basilar artery stenosis. Normal SCA and PCA origins. Small posterior communicating arteries. Normal bilateral PCA branches.  Antegrade flow in both ICA siphons. Evidence of calcified plaque without siphon stenosis. Tortuous but otherwise normal ophthalmic artery origins, more so the left. Patent carotid termini. Normal MCA and ACA origins. Normal posterior communicating artery origins.  Tortuous proximal ACA is. Diminutive or absent anterior communicating artery. Visualized bilateral ACA branches are within normal limits. Mildly tortuous bilateral MCAs. No MCA stenosis or branch occlusion identified. Overall mild generalized intracranial artery dolichoectasia.  IMPRESSION: 1. Scattered small  acute to subacute, and occasional early chronic, lacunar infarcts in both MCA territories and the left cerebellum. None of these appear hemorrhagic (cerebral septic emboli are prone to bleed), but consider sequelae of endocarditis and septic emboli in this setting. No significant edema or mass effect. 2. Chronic anterior division left MCA infarct. A small number of chronic micro hemorrhages are identified in both hemispheres an the left cerebellum. 3. Mild for age intracranial atherosclerosis with no stenosis or branch occlusion identified. Mild intracranial artery dolichoectasia.   Electronically Signed   By: Genevie Ann M.D.   On: 10/20/2014 09:49   Mr [name] Contrast  10/20/2014   CLINICAL DATA:  64 year old male with altered mental status, dizziness, loss of balance. Fever, petechial rash, endocarditis. Initial encounter.  EXAM: MRI  HEAD WITHOUT CONTRAST  MRA HEAD WITHOUT CONTRAST  TECHNIQUE: Multiplanar, multiecho pulse sequences of the brain and surrounding structures were obtained without intravenous contrast. Angiographic images of the head were obtained using MRA technique without contrast.  COMPARISON:  Head CT without contrast 08/08/2008.  FINDINGS: MRI HEAD FINDINGS  Wedge-shaped encephalomalacia in the anterior left middle temporal gyrus (series 9, image 19). 4-5 small foci of chronic appearing microhemorrhage, the largest in the left occipital pole on series 11, image 42. On diffusion there are scattered small foci of increased trace diffusion signal in both hemispheres. The majority year in the left MCA territory, but right MCA and right MCA/ PCA watershed territory is also affected. Furthermore, there are several similar small foci in the left cerebellar hemisphere. Some of these demonstrate facilitated diffusion on ADC, and T2 and FLAIR hyperintensity (lesion in the posterior left corona radiata on series 5, image 37). Some are restricted.  Deep gray matter nuclei and brainstem are spared. Major intracranial vascular flow voids are preserved with mild intracranial artery dolichoectasia.  No intracranial mass effect. No ventriculomegaly. Cerebral volume is within normal limits for age. Negative pituitary, cervicomedullary junction and visualized cervical spine.  Visible internal auditory structures appear normal. Trace left mastoid effusion. Paranasal sinuses and right mastoid are clear. Negative orbit and scalp.  Nonspecific decreased bone marrow signal at the skullbase and in the visualized cervical spine, patchy geographic pattern. More normal marrow signal in the calvarium.  MRA HEAD FINDINGS  Antegrade flow in the posterior circulation with dominant distal left vertebral artery. Mild distal left vertebral artery atherosclerosis without significant stenosis. Patent vertebrobasilar junction. Normal left PICA. Dominant appearing  right AICA. No basilar artery stenosis. Normal SCA and PCA origins. Small posterior communicating arteries. Normal bilateral PCA branches.  Antegrade flow in both ICA siphons. Evidence of calcified plaque without siphon stenosis. Tortuous but otherwise normal ophthalmic artery origins, more so the left. Patent carotid termini. Normal MCA and ACA origins. Normal posterior communicating artery origins.  Tortuous proximal ACA is. Diminutive or absent anterior communicating artery. Visualized bilateral ACA branches are within normal limits. Mildly tortuous bilateral MCAs. No MCA stenosis or branch occlusion identified. Overall mild generalized intracranial artery dolichoectasia.  IMPRESSION: 1. Scattered small acute to subacute, and occasional early chronic, lacunar infarcts in both MCA territories and the left cerebellum. None of these appear hemorrhagic (cerebral septic emboli are prone to bleed), but consider sequelae of endocarditis and septic emboli in this setting. No significant edema or mass effect. 2. Chronic anterior division left MCA infarct. A small number of chronic micro hemorrhages are identified in both hemispheres an the left cerebellum. 3. Mild for age intracranial atherosclerosis with no stenosis or branch occlusion identified. Mild  intracranial artery dolichoectasia.   Electronically Signed   By: Genevie Ann M.D.   On: 10/20/2014 09:49    Assessment/Plan:  1) MV and AV endocarditis with Enterococcus - repeat blood cultures sent, has seen Dr. Roxy Manns and agree with consideration of colonoscopy by GI.  Will continue with current antibiotics.   2) penicillin allergy - discussed with ex-wife over the phone.  The history is of an unknown childhood reaction, no known swelling or rash but the patient does not recall and the ex-wife does not know, did not ever witness anything.  With this history, I think it is very low risk and unlikely to be any IgE mediated event which would be more memorable.  I  discussed with the patient and his current wife and they agree to proceed with a challenge while in the hospital, will do in the am with ampicillin.  The benefits of treatment including better CNS penetration, less risk to renal failure, less risk of hearing loss and the benefits outweigh low likelihood of harm.    35 minutes spent discussing this with patient and wife and coordinating care.      Scharlene Gloss, [facility] [place] [email or web address] [phone] pager   [phone] cell 10/21/2014, 12:39 PM

## 2014-10-21 NOTE — Progress Notes (Addendum)
Central telemetry called RN to report abnormal, fluctuating heart rhythm (A.fib with RVR, A. Flutter, SVT, and Sinus Tach.) and HR 130-180. EKG performed. [facility] notified. 10 mg Cardizem IV injection ordered and administered. Pt has low grade temp. Tylenol PRN administered. Will continue to monitor.   2148: Call received from central telemetry. Pt converted from A. Fib to sinus rhythm. Will continue to monitor.   0503: Call received from central telemetry. Pt appears to be in A. Flutter. HR 100-120.   Hart Rochester, RN, BSN

## 2014-10-21 NOTE — Progress Notes (Signed)
SUBJECTIVE:  Complains of left rib pain  OBJECTIVE:   Vitals:   Filed Vitals:   10/20/14 2000 10/20/14 2350 10/21/14 0100 10/21/14 0431  BP: 129/70 131/69  144/79  Pulse:    116  Temp:  98.8 F (37.1 C)  100.1 F (37.8 C)  TempSrc:  Oral  Oral  Resp: 19 25  26   Height:      Weight:      SpO2: 93% 89% 94%    I&O's:   Intake/Output Summary (Last 24 hours) at 10/21/14 1022 Last data filed at 10/21/14 1014  Gross per 24 hour  Intake   1996 ml  Output   1300 ml  Net    696 ml   TELEMETRY: Reviewed telemetry pt in sinus tachycardia at 110bpm:     PHYSICAL EXAM General: Well developed, well nourished, in no acute distress Head: Eyes PERRLA, No xanthomas.   Normal cephalic and atramatic  Lungs:   Clear bilaterally to auscultation and percussion. Heart:   HRRR tachy S1 S2 Pulses are 2+ & equal. Abdomen: Bowel sounds are positive, abdomen soft and non-tender without masses Extremities:   No clubbing, cyanosis or edema.  DP +1 Neuro: Alert and oriented X 3. Psych:  Good affect, responds appropriately   LABS: Basic Metabolic Panel:  Recent Labs  10/19/14 0310 10/21/14 0236  NA 134* 134*  K 3.7 4.3  CL 106 103  CO2 22 26  GLUCOSE 122* 109*  BUN 10 5*  CREATININE 0.94 0.94  CALCIUM 8.4* 8.4*   Liver Function Tests: No results for input(s): AST, ALT, ALKPHOS, BILITOT, PROT, ALBUMIN in the last 72 hours. No results for input(s): LIPASE, AMYLASE in the last 72 hours. CBC:  Recent Labs  10/19/14 0310 10/21/14 0236  WBC 6.3 11.6*  HGB 8.8* 10.0*  HCT 27.2* 30.9*  MCV 84.2 83.1  PLT 212 302   Cardiac Enzymes: No results for input(s): CKTOTAL, CKMB, CKMBINDEX, TROPONINI in the last 72 hours. BNP: Invalid input(s): POCBNP D-Dimer: No results for input(s): DDIMER in the last 72 hours. Hemoglobin A1C:  Recent Labs  10/20/14 0254  HGBA1C 6.4*   Fasting Lipid Panel: No results for input(s): CHOL, HDL, LDLCALC, TRIG, CHOLHDL, LDLDIRECT in the last 72  hours. Thyroid Function Tests:  Recent Labs  10/18/14 1030  TSH 1.708   Anemia Panel:  Recent Labs  10/18/14 1440 10/18/14 1445  VITAMINB12  --  364  FOLATE 7.4  --   FERRITIN  --  332  TIBC  --  151*  IRON  --  8*  RETICCTPCT  --  1.0   Coag Panel:   Lab Results  Component Value Date   INR 1.39 10/18/2014    RADIOLOGY: Dg Orthopantogram  10/21/2014   CLINICAL DATA:  Dental anomaly.  Endocarditis.  EXAM: ORTHOPANTOGRAM/PANORAMIC  COMPARISON:  None.  FINDINGS: No fracture or bone lesion.  There are multiple metal fillings. Dentition is otherwise unremarkable with no periapical lucency to suggest a dental abscess. All third molars have been removed. Second and third molar have been removed the right axilla.  IMPRESSION: 1. No fracture or bone lesion. No evidence of a dental abscess or untreated carie.   Electronically Signed   By: Lajean Manes M.D.   On: 10/21/2014 10:09   Dg Chest 2 View  10/18/2014   CLINICAL DATA:  Fatigue, fever, and weakness for 3 weeks. History of prostate cancer.  EXAM: CHEST  2 VIEW  COMPARISON:  08/08/2008  FINDINGS: The  heart size and mediastinal contours are within normal limits. Both lungs are clear. The visualized skeletal structures are unremarkable.  IMPRESSION: No active cardiopulmonary disease.   Electronically Signed   By: Lucienne Capers M.D.   On: 10/18/2014 00:49   Mr Virgel Paling Wo Contrast  10/20/2014   CLINICAL DATA:  64 year old male with altered mental status, dizziness, loss of balance. Fever, petechial rash, endocarditis. Initial encounter.  EXAM: MRI HEAD WITHOUT CONTRAST  MRA HEAD WITHOUT CONTRAST  TECHNIQUE: Multiplanar, multiecho pulse sequences of the brain and surrounding structures were obtained without intravenous contrast. Angiographic images of the head were obtained using MRA technique without contrast.  COMPARISON:  Head CT without contrast 08/08/2008.  FINDINGS: MRI HEAD FINDINGS  Wedge-shaped encephalomalacia in the anterior  left middle temporal gyrus (series 9, image 19). 4-5 small foci of chronic appearing microhemorrhage, the largest in the left occipital pole on series 11, image 42. On diffusion there are scattered small foci of increased trace diffusion signal in both hemispheres. The majority year in the left MCA territory, but right MCA and right MCA/ PCA watershed territory is also affected. Furthermore, there are several similar small foci in the left cerebellar hemisphere. Some of these demonstrate facilitated diffusion on ADC, and T2 and FLAIR hyperintensity (lesion in the posterior left corona radiata on series 5, image 37). Some are restricted.  Deep gray matter nuclei and brainstem are spared. Major intracranial vascular flow voids are preserved with mild intracranial artery dolichoectasia.  No intracranial mass effect. No ventriculomegaly. Cerebral volume is within normal limits for age. Negative pituitary, cervicomedullary junction and visualized cervical spine.  Visible internal auditory structures appear normal. Trace left mastoid effusion. Paranasal sinuses and right mastoid are clear. Negative orbit and scalp.  Nonspecific decreased bone marrow signal at the skullbase and in the visualized cervical spine, patchy geographic pattern. More normal marrow signal in the calvarium.  MRA HEAD FINDINGS  Antegrade flow in the posterior circulation with dominant distal left vertebral artery. Mild distal left vertebral artery atherosclerosis without significant stenosis. Patent vertebrobasilar junction. Normal left PICA. Dominant appearing right AICA. No basilar artery stenosis. Normal SCA and PCA origins. Small posterior communicating arteries. Normal bilateral PCA branches.  Antegrade flow in both ICA siphons. Evidence of calcified plaque without siphon stenosis. Tortuous but otherwise normal ophthalmic artery origins, more so the left. Patent carotid termini. Normal MCA and ACA origins. Normal posterior communicating artery  origins.  Tortuous proximal ACA is. Diminutive or absent anterior communicating artery. Visualized bilateral ACA branches are within normal limits. Mildly tortuous bilateral MCAs. No MCA stenosis or branch occlusion identified. Overall mild generalized intracranial artery dolichoectasia.  IMPRESSION: 1. Scattered small acute to subacute, and occasional early chronic, lacunar infarcts in both MCA territories and the left cerebellum. None of these appear hemorrhagic (cerebral septic emboli are prone to bleed), but consider sequelae of endocarditis and septic emboli in this setting. No significant edema or mass effect. 2. Chronic anterior division left MCA infarct. A small number of chronic micro hemorrhages are identified in both hemispheres an the left cerebellum. 3. Mild for age intracranial atherosclerosis with no stenosis or branch occlusion identified. Mild intracranial artery dolichoectasia.   Electronically Signed   By: Genevie Ann M.D.   On: 10/20/2014 09:49   Mr [name] Contrast  10/20/2014   CLINICAL DATA:  64 year old male with altered mental status, dizziness, loss of balance. Fever, petechial rash, endocarditis. Initial encounter.  EXAM: MRI HEAD WITHOUT CONTRAST  MRA HEAD WITHOUT CONTRAST  TECHNIQUE: Multiplanar, multiecho pulse sequences of the brain and surrounding structures were obtained without intravenous contrast. Angiographic images of the head were obtained using MRA technique without contrast.  COMPARISON:  Head CT without contrast 08/08/2008.  FINDINGS: MRI HEAD FINDINGS  Wedge-shaped encephalomalacia in the anterior left middle temporal gyrus (series 9, image 19). 4-5 small foci of chronic appearing microhemorrhage, the largest in the left occipital pole on series 11, image 42. On diffusion there are scattered small foci of increased trace diffusion signal in both hemispheres. The majority year in the left MCA territory, but right MCA and right MCA/ PCA watershed territory is also affected.  Furthermore, there are several similar small foci in the left cerebellar hemisphere. Some of these demonstrate facilitated diffusion on ADC, and T2 and FLAIR hyperintensity (lesion in the posterior left corona radiata on series 5, image 37). Some are restricted.  Deep gray matter nuclei and brainstem are spared. Major intracranial vascular flow voids are preserved with mild intracranial artery dolichoectasia.  No intracranial mass effect. No ventriculomegaly. Cerebral volume is within normal limits for age. Negative pituitary, cervicomedullary junction and visualized cervical spine.  Visible internal auditory structures appear normal. Trace left mastoid effusion. Paranasal sinuses and right mastoid are clear. Negative orbit and scalp.  Nonspecific decreased bone marrow signal at the skullbase and in the visualized cervical spine, patchy geographic pattern. More normal marrow signal in the calvarium.  MRA HEAD FINDINGS  Antegrade flow in the posterior circulation with dominant distal left vertebral artery. Mild distal left vertebral artery atherosclerosis without significant stenosis. Patent vertebrobasilar junction. Normal left PICA. Dominant appearing right AICA. No basilar artery stenosis. Normal SCA and PCA origins. Small posterior communicating arteries. Normal bilateral PCA branches.  Antegrade flow in both ICA siphons. Evidence of calcified plaque without siphon stenosis. Tortuous but otherwise normal ophthalmic artery origins, more so the left. Patent carotid termini. Normal MCA and ACA origins. Normal posterior communicating artery origins.  Tortuous proximal ACA is. Diminutive or absent anterior communicating artery. Visualized bilateral ACA branches are within normal limits. Mildly tortuous bilateral MCAs. No MCA stenosis or branch occlusion identified. Overall mild generalized intracranial artery dolichoectasia.  IMPRESSION: 1. Scattered small acute to subacute, and occasional early chronic, lacunar  infarcts in both MCA territories and the left cerebellum. None of these appear hemorrhagic (cerebral septic emboli are prone to bleed), but consider sequelae of endocarditis and septic emboli in this setting. No significant edema or mass effect. 2. Chronic anterior division left MCA infarct. A small number of chronic micro hemorrhages are identified in both hemispheres an the left cerebellum. 3. Mild for age intracranial atherosclerosis with no stenosis or branch occlusion identified. Mild intracranial artery dolichoectasia.   Electronically Signed   By: Genevie Ann M.D.   On: 10/20/2014 09:49   ASSESSMENT AND PLAN  1. 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 ID is on board, currently treating with vancomycin.  - TEE showed MV vegetation on atrial surface of AMVL with mild MR and large mobile vegetation on noncoronary cusp with severe AR with no evidence of abscess.  Normal LVF.   - MRA of brian showed - 1. Scattered small acute to subacute, and occasional early chronic, lacunar infarcts in both MCA territories and the left cerebellum. None of these appear hemorrhagic (cerebral septic emboli are prone to bleed), but consider sequelae of endocarditis and septic emboli in this setting. No significant edema  or mass effect. 2. Chronic anterior division left MCA infarct. A small number of chronic micro hemorrhages are identified in both hemispheres an the left cerebellum. 3. Mild for age intracranial atherosclerosis with no stenosis or branch occlusion identified. Mild intracranial artery dolichoectasia.  - Consider GI workup for source of bacteremia. Lipase of 72. - CVTS on board - will need right and left heart cath   2. Paroxysmal atrial fibrillation and paroxysmal atrial flutter, newly recognized - Going in and out. Currently in sinus rhythm at ventricular rate of 90-110s.  - TSH  normal. HgbA1c is pending. - CHADSVASC now 2. (age >37 and stroke). No anticoagulation for now.   3. Anemia of unknown chronicity, possibly related to above process - Hemoglobin of 10 today, was 8.8 on 8/25. Monitor closely.  4. Hyponatremia, improved 5. Recent 20lb weight loss likely due to the above 6. Diarrhea, C diff being ruled out  Sueanne Margarita, MD  10/21/2014  10:22 AM

## 2014-10-21 NOTE — Progress Notes (Signed)
ANTIBIOTIC CONSULT NOTE  Pharmacy Consult for vancomycin Indication: Bacteremia  Allergies  Allergen Reactions  . Penicillins Swelling    Taste buds on tongue swell    Patient Measurements: Height: 6' (182.9 cm) Weight: 165 lb 11.2 oz (75.161 kg) IBW/kg (Calculated) : 77.6  Vital Signs: Temp: 99.6 F (37.6 C) (08/27 1913) Temp Source: Oral (08/27 1913) BP: 97/60 mmHg (08/27 2130) Pulse Rate: 109 (08/27 2130) Intake/Output from previous day: 08/26 0701 - 08/27 0700 In: 2393 [P.O.:240; I.V.:1903; IV Piggyback:250] Out: 700 [Urine:700] Intake/Output from this shift: Total I/O In: -  Out: 250 [Urine:250]  Labs:  Recent Labs  10/19/14 0310 10/21/14 0236 10/21/14 2048  WBC 6.3 11.6* 14.3*  HGB 8.8* 10.0* 10.3*  PLT 212 302 294  CREATININE 0.94 0.94 1.08   Estimated Creatinine Clearance: 71.6 mL/min (by C-G formula based on Cr of 1.08).  Recent Labs  10/21/14 2048  [facility] 16     Microbiology: Recent Results (from the past 720 hour(s))  Blood Culture (routine x 2)     Status: None   Collection Time: 10/18/14  1:11 AM  Result Value Ref Range Status   Specimen Description BLOOD RIGHT ANTECUBITAL  Final   Special Requests BOTTLES DRAWN AEROBIC AND ANAEROBIC 10CC  Final   Culture  Setup Time   Final    GRAM POSITIVE COCCI IN PAIRS IN BOTH AEROBIC AND ANAEROBIC BOTTLES CRITICAL RESULT CALLED TO, READ BACK BY AND VERIFIED WITH: P CARBORNE 10/18/14 @ 64 M VESTAL    Culture ENTEROCOCCUS SPECIES  Final   Report Status 10/20/2014 FINAL  Final   Organism ID, Bacteria ENTEROCOCCUS SPECIES  Final      Susceptibility   Enterococcus species - MIC*    AMPICILLIN <=2 SENSITIVE Sensitive     VANCOMYCIN 1 SENSITIVE Sensitive     GENTAMICIN SYNERGY SENSITIVE Sensitive     LINEZOLID 2 SENSITIVE Sensitive     * ENTEROCOCCUS SPECIES  Blood Culture (routine x 2)     Status: None   Collection Time: 10/18/14  1:19 AM  Result Value Ref Range Status   Specimen  Description BLOOD RIGHT WRIST  Final   Special Requests BOTTLES DRAWN AEROBIC AND ANAEROBIC 10CC  Final   Culture  Setup Time   Final    GRAM POSITIVE COCCI IN PAIRS IN BOTH AEROBIC AND ANAEROBIC BOTTLES CRITICAL RESULT CALLED TO, READ BACK BY AND VERIFIED WITH: P CARBORNE 10/18/14 @ 64 M VESTAL    Culture   Final    ENTEROCOCCUS SPECIES SUSCEPTIBILITIES PERFORMED ON PREVIOUS CULTURE WITHIN THE LAST 5 DAYS.    Report Status 10/20/2014 FINAL  Final  Urine culture     Status: None   Collection Time: 10/18/14  2:56 AM  Result Value Ref Range Status   Specimen Description URINE, CATHETERIZED  Final   Special Requests NONE  Final   Culture 20,000 COLONIES/mL ENTEROCOCCUS FAECALIS  Final   Report Status 10/20/2014 FINAL  Final   Organism ID, Bacteria ENTEROCOCCUS FAECALIS  Final      Susceptibility   Enterococcus faecalis - MIC*    AMPICILLIN <=2 SENSITIVE Sensitive     LEVOFLOXACIN 1 SENSITIVE Sensitive     NITROFURANTOIN <=16 SENSITIVE Sensitive     VANCOMYCIN 1 SENSITIVE Sensitive     LINEZOLID 2 SENSITIVE Sensitive     * 20,000 COLONIES/mL ENTEROCOCCUS FAECALIS  MRSA PCR Screening     Status: None   Collection Time: 10/18/14  5:58 PM  Result Value Ref Range Status  MRSA by PCR NEGATIVE NEGATIVE Final    Comment:        The GeneXpert MRSA Assay (FDA approved for NASAL specimens only), is one component of a comprehensive MRSA colonization surveillance program. It is not intended to diagnose MRSA infection nor to guide or monitor treatment for MRSA infections.   Culture, blood (routine x 2)     Status: None (Preliminary result)   Collection Time: 10/20/14 11:10 AM  Result Value Ref Range Status   Specimen Description BLOOD RIGHT ARM  Final   Special Requests BOTTLES DRAWN AEROBIC AND ANAEROBIC 5CC  Final   Culture NO GROWTH 1 DAY  Final   Report Status PENDING  Incomplete  Culture, blood (routine x 2)     Status: None (Preliminary result)   Collection Time: 10/20/14  11:26 AM  Result Value Ref Range Status   Specimen Description BLOOD LEFT HAND  Final   Special Requests BOTTLES DRAWN AEROBIC AND ANAEROBIC 5CC  Final   Culture NO GROWTH 1 DAY  Final   Report Status PENDING  Incomplete    Medical History: Past Medical History  Diagnosis Date  . Restless leg syndrome   . Prostate cancer   . 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 Paroxysmal atrial fibrillation 10/19/2014  . Paroxysmal atrial flutter 10/19/2014  . Enterococcal bacteremia 10/19/2014   Assessment: 64 yo M presents on 8/24 with AMS, dizziness, and rashes. Pt's wife reports he hasn't been feeling well lately. Also found to have a fever of 101.2. Started on cipro, flagyl, and doxy. Now found to have 2/2 positive blood cx's with GPC in pairs. Pharmacy consulted to start vancomycin. Afebrile, WBC wnl. SCr 0.94, CrCl ~87ml/min.  VT is therapeutic at 16 mcg/mL on vancomycin 1g q12h. Pt is mildly febrile to 99.6, WBC 14.3, sCr 1.08.  Goal of Therapy:  Vancomycin trough level 15-20 mcg/ml  Resolution of infection  Plan:  Continue vancomycin 1g IV q12h Monitor clinical picture, renal function, VT prn F/U C&S, abx deescalation / LOT  Andrey Cota. Diona Foley, PharmD Clinical Pharmacist Pager [phone] 10/21/2014,9:44 PM

## 2014-10-22 DIAGNOSIS — I340 Nonrheumatic mitral (valve) insufficiency: Secondary | ICD-10-CM

## 2014-10-22 LAB — GENTAMICIN LEVEL, TROUGH: GENTAMICIN TR: 0.5 ug/mL (ref 0.5–2.0)

## 2014-10-22 LAB — GENTAMICIN LEVEL, PEAK: Gentamicin Pk: 2.3 ug/mL — ABNORMAL LOW (ref 5.0–10.0)

## 2014-10-22 LAB — TROPONIN I
TROPONIN I: 0.25 ng/mL — AB (ref <0.031)
TROPONIN I: 0.19 ng/mL — AB (ref <0.031)
Troponin I: 0.16 ng/mL — ABNORMAL HIGH (ref <0.031)
Troponin I: 0.19 ng/mL — ABNORMAL HIGH (ref <0.031)

## 2014-10-22 LAB — GLUCOSE, CAPILLARY: Glucose-Capillary: 108 mg/dL — ABNORMAL HIGH (ref 65–99)

## 2014-10-22 MED ORDER — DILTIAZEM HCL 25 MG/5ML IV SOLN
10.0000 mg | Freq: Once | INTRAVENOUS | Status: AC
  Administered 2014-10-22: 10 mg via INTRAVENOUS
  Filled 2014-10-22: qty 5

## 2014-10-22 MED ORDER — AMIODARONE HCL IN DEXTROSE 360-4.14 MG/200ML-% IV SOLN
60.0000 mg/h | INTRAVENOUS | Status: AC
  Administered 2014-10-22 (×2): 60 mg/h via INTRAVENOUS
  Filled 2014-10-22 (×2): qty 200

## 2014-10-22 MED ORDER — SODIUM CHLORIDE 0.9 % IV SOLN: INTRAVENOUS | Status: DC

## 2014-10-22 MED ORDER — AMIODARONE LOAD VIA INFUSION
150.0000 mg | Freq: Once | INTRAVENOUS | Status: AC
  Administered 2014-10-22: 150 mg via INTRAVENOUS
  Filled 2014-10-22: qty 83.34

## 2014-10-22 MED ORDER — AMIODARONE HCL IN DEXTROSE 360-4.14 MG/200ML-% IV SOLN
30.0000 mg/h | INTRAVENOUS | Status: DC
  Administered 2014-10-22 – 2014-10-23 (×2): 30 mg/h via INTRAVENOUS
  Filled 2014-10-22 (×3): qty 200

## 2014-10-22 MED ORDER — SODIUM CHLORIDE 0.9 % IV SOLN
2.0000 g | INTRAVENOUS | Status: DC
  Administered 2014-10-22 – 2014-11-12 (×120): 2 g via INTRAVENOUS
  Filled 2014-10-22 (×137): qty 2000

## 2014-10-22 MED ORDER — DEXTROSE 5 % IV SOLN
2.0000 g | INTRAVENOUS | Status: DC
  Administered 2014-10-22: 2 g via INTRAVENOUS
  Filled 2014-10-22 (×2): qty 2

## 2014-10-22 NOTE — Progress Notes (Addendum)
[facility] for Infectious Disease  Date of Admission:  10/17/2014  Antibiotics: vanco/gent  Subjective: No complaints  Objective: Temp:  [97.9 F (36.6 C)-101.3 F (38.5 C)] 98.5 F (36.9 C) (08/28 0807) Pulse Rate:  [91-142] 103 (08/28 0807) Resp:  [18-29] 22 (08/28 0807) BP: (97-130)/(51-79) 130/77 mmHg (08/28 0807) SpO2:  [91 %-97 %] 97 % (08/28 0807)  General: awake, alert, nad Skin: no rashes Lungs: CTA B Cor: RRR Abdomen: soft, nt, nd Ext: no edema  ROS: no headache, has right shoulder pain which has been present for over 1 month  Lab Results Lab Results  Component Value Date   WBC 14.3* 10/21/2014   HGB 10.3* 10/21/2014   HCT 31.0* 10/21/2014   MCV 84.0 10/21/2014   PLT 294 10/21/2014    Lab Results  Component Value Date   CREATININE 1.08 10/21/2014   BUN 6 10/21/2014   NA 133* 10/21/2014   K 4.3 10/21/2014   CL 100* 10/21/2014   CO2 28 10/21/2014    Lab Results  Component Value Date   ALT 28 10/21/2014   AST 49* 10/21/2014   ALKPHOS 85 10/21/2014   BILITOT 0.5 10/21/2014      Microbiology: Recent Results (from the past 240 hour(s))  Blood Culture (routine x 2)     Status: None   Collection Time: 10/18/14  1:11 AM  Result Value Ref Range Status   Specimen Description BLOOD RIGHT ANTECUBITAL  Final   Special Requests BOTTLES DRAWN AEROBIC AND ANAEROBIC 10CC  Final   Culture  Setup Time   Final    GRAM POSITIVE COCCI IN PAIRS IN BOTH AEROBIC AND ANAEROBIC BOTTLES CRITICAL RESULT CALLED TO, READ BACK BY AND VERIFIED WITH: P CARBORNE 10/18/14 @ 64 M VESTAL    Culture ENTEROCOCCUS SPECIES  Final   Report Status 10/20/2014 FINAL  Final   Organism ID, Bacteria ENTEROCOCCUS SPECIES  Final      Susceptibility   Enterococcus species - MIC*    AMPICILLIN <=2 SENSITIVE Sensitive     VANCOMYCIN 1 SENSITIVE Sensitive     GENTAMICIN SYNERGY SENSITIVE Sensitive     LINEZOLID 2 SENSITIVE Sensitive     * ENTEROCOCCUS SPECIES  Blood Culture  (routine x 2)     Status: None   Collection Time: 10/18/14  1:19 AM  Result Value Ref Range Status   Specimen Description BLOOD RIGHT WRIST  Final   Special Requests BOTTLES DRAWN AEROBIC AND ANAEROBIC 10CC  Final   Culture  Setup Time   Final    GRAM POSITIVE COCCI IN PAIRS IN BOTH AEROBIC AND ANAEROBIC BOTTLES CRITICAL RESULT CALLED TO, READ BACK BY AND VERIFIED WITH: P CARBORNE 10/18/14 @ 6 M VESTAL    Culture   Final    ENTEROCOCCUS SPECIES SUSCEPTIBILITIES PERFORMED ON PREVIOUS CULTURE WITHIN THE LAST 5 DAYS.    Report Status 10/20/2014 FINAL  Final  Urine culture     Status: None   Collection Time: 10/18/14  2:56 AM  Result Value Ref Range Status   Specimen Description URINE, CATHETERIZED  Final   Special Requests NONE  Final   Culture 20,000 COLONIES/mL ENTEROCOCCUS FAECALIS  Final   Report Status 10/20/2014 FINAL  Final   Organism ID, Bacteria ENTEROCOCCUS FAECALIS  Final      Susceptibility   Enterococcus faecalis - MIC*    AMPICILLIN <=2 SENSITIVE Sensitive     LEVOFLOXACIN 1 SENSITIVE Sensitive     NITROFURANTOIN <=16 SENSITIVE Sensitive  VANCOMYCIN 1 SENSITIVE Sensitive     LINEZOLID 2 SENSITIVE Sensitive     * 20,000 COLONIES/mL ENTEROCOCCUS FAECALIS  MRSA PCR Screening     Status: None   Collection Time: 10/18/14  5:58 PM  Result Value Ref Range Status   MRSA by PCR NEGATIVE NEGATIVE Final    Comment:        The GeneXpert MRSA Assay (FDA approved for NASAL specimens only), is one component of a comprehensive MRSA colonization surveillance program. It is not intended to diagnose MRSA infection nor to guide or monitor treatment for MRSA infections.   Culture, blood (routine x 2)     Status: None (Preliminary result)   Collection Time: 10/20/14 11:10 AM  Result Value Ref Range Status   Specimen Description BLOOD RIGHT ARM  Final   Special Requests BOTTLES DRAWN AEROBIC AND ANAEROBIC 5CC  Final   Culture NO GROWTH 1 DAY  Final   Report Status PENDING   Incomplete  Culture, blood (routine x 2)     Status: None (Preliminary result)   Collection Time: 10/20/14 11:26 AM  Result Value Ref Range Status   Specimen Description BLOOD LEFT HAND  Final   Special Requests BOTTLES DRAWN AEROBIC AND ANAEROBIC 5CC  Final   Culture NO GROWTH 1 DAY  Final   Report Status PENDING  Incomplete    Studies/Results: Dg Orthopantogram  10/21/2014   CLINICAL DATA:  Dental anomaly.  Endocarditis.  EXAM: ORTHOPANTOGRAM/PANORAMIC  COMPARISON:  None.  FINDINGS: No fracture or bone lesion.  There are multiple metal fillings. Dentition is otherwise unremarkable with no periapical lucency to suggest a dental abscess. All third molars have been removed. Second and third molar have been removed the right axilla.  IMPRESSION: 1. No fracture or bone lesion. No evidence of a dental abscess or untreated carie.   Electronically Signed   By: Lajean Manes M.D.   On: 10/21/2014 10:09    Assessment/Plan:  1) MV and AV endocarditis with Enterococcus - repeat blood cultures sent, has seen Dr. Roxy Manns and GI and to get colonoscopy tomorrow.  2) penicillin allergy - discussed with ex-wife over the phone.  The history is of an unknown childhood reaction, no known swelling or rash but the patient does not recall and the ex-wife does not know, did not ever witness anything.  With this history, I think it is very low risk and unlikely to be any IgE mediated event which would be more memorable.  I discussed with the patient again and he agrees to proceed with a challenge while in the hospital and will do today, discussed with nursing as well.   Dr. Megan Salon back tomorrow.     Scharlene Gloss, [facility] [place] [email or web address] [phone] pager   [phone] cell 10/22/2014, 10:53 AM

## 2014-10-22 NOTE — Progress Notes (Signed)
SUBJECTIVE:  Complains of back pain  OBJECTIVE:   Vitals:   Filed Vitals:   10/22/14 0200 10/22/14 0444 10/22/14 0807 10/22/14 1224  BP: 119/65 106/68 130/77 125/77  Pulse: 91 116 103 99  Temp: 98.6 F (37 C) 97.9 F (36.6 C) 98.5 F (36.9 C) 98.6 F (37 C)  TempSrc: Oral Oral Oral Oral  Resp: 23 18 22 27   Height:      Weight:      SpO2: 94% 93% 97% 95%   I&O's:   Intake/Output Summary (Last 24 hours) at 10/22/14 1419 Last data filed at 10/22/14 1143  Gross per 24 hour  Intake   1953 ml  Output   2700 ml  Net   -747 ml   TELEMETRY: Reviewed telemetry pt in atrial flutter with RVR at 140bpm     PHYSICAL EXAM General: Well developed, well nourished, in no acute distress Head: Eyes PERRLA, No xanthomas.   Normal cephalic and atramatic  Lungs:   Clear bilaterally to auscultation and percussion. Heart:   irreguarly irregular and tachy S1 S2 Pulses are 2+ & equal. Abdomen: Bowel sounds are positive, abdomen soft and non-tender without masses  Extremities:   No clubbing, cyanosis or edema.  DP +1 Neuro: Alert and oriented X 3. Psych:  Good affect, responds appropriately   LABS: Basic Metabolic Panel:  Recent Labs  10/21/14 0236 10/21/14 2048  NA 134* 133*  K 4.3 4.3  CL 103 100*  CO2 26 28  GLUCOSE 109* 119*  BUN 5* 6  CREATININE 0.94 1.08  CALCIUM 8.4* 8.5*  MG  --  1.7  PHOS  --  3.4   Liver Function Tests:  Recent Labs  10/21/14 2048  AST 49*  ALT 28  ALKPHOS 85  BILITOT 0.5  PROT 5.7*  ALBUMIN 1.9*   No results for input(s): LIPASE, AMYLASE in the last 72 hours. CBC:  Recent Labs  10/21/14 0236 10/21/14 2048  WBC 11.6* 14.3*  NEUTROABS  --  10.7*  HGB 10.0* 10.3*  HCT 30.9* 31.0*  MCV 83.1 84.0  PLT 302 294   Cardiac Enzymes:  Recent Labs  10/21/14 2048 10/22/14 0146 10/22/14 0904  TROPONINI 0.28* 0.25* 0.19*   BNP: Invalid input(s): POCBNP D-Dimer: No results for input(s): DDIMER in the last 72 hours. Hemoglobin  A1C:  Recent Labs  10/20/14 0254  HGBA1C 6.4*   Fasting Lipid Panel: No results for input(s): CHOL, HDL, LDLCALC, TRIG, CHOLHDL, LDLDIRECT in the last 72 hours. Thyroid Function Tests: No results for input(s): TSH, T4TOTAL, T3FREE, THYROIDAB in the last 72 hours.  Invalid input(s): FREET3 Anemia Panel: No results for input(s): VITAMINB12, FOLATE, FERRITIN, TIBC, IRON, RETICCTPCT in the last 72 hours. Coag Panel:   Lab Results  Component Value Date   INR 1.39 10/18/2014    RADIOLOGY: Dg Orthopantogram  10/21/2014   CLINICAL DATA:  Dental anomaly.  Endocarditis.  EXAM: ORTHOPANTOGRAM/PANORAMIC  COMPARISON:  None.  FINDINGS: No fracture or bone lesion.  There are multiple metal fillings. Dentition is otherwise unremarkable with no periapical lucency to suggest a dental abscess. All third molars have been removed. Second and third molar have been removed the right axilla.  IMPRESSION: 1. No fracture or bone lesion. No evidence of a dental abscess or untreated carie.   Electronically Signed   By: Lajean Manes M.D.   On: 10/21/2014 10:09   Dg Chest 2 View  10/18/2014   CLINICAL DATA:  Fatigue, fever, and weakness for 3 weeks.  History of prostate cancer.  EXAM: CHEST  2 VIEW  COMPARISON:  08/08/2008  FINDINGS: The heart size and mediastinal contours are within normal limits. Both lungs are clear. The visualized skeletal structures are unremarkable.  IMPRESSION: No active cardiopulmonary disease.   Electronically Signed   By: Lucienne Capers M.D.   On: 10/18/2014 00:49   Mr Virgel Paling Wo Contrast  10/20/2014   CLINICAL DATA:  64 year old male with altered mental status, dizziness, loss of balance. Fever, petechial rash, endocarditis. Initial encounter.  EXAM: MRI HEAD WITHOUT CONTRAST  MRA HEAD WITHOUT CONTRAST  TECHNIQUE: Multiplanar, multiecho pulse sequences of the brain and surrounding structures were obtained without intravenous contrast. Angiographic images of the head were obtained using  MRA technique without contrast.  COMPARISON:  Head CT without contrast 08/08/2008.  FINDINGS: MRI HEAD FINDINGS  Wedge-shaped encephalomalacia in the anterior left middle temporal gyrus (series 9, image 19). 4-5 small foci of chronic appearing microhemorrhage, the largest in the left occipital pole on series 11, image 42. On diffusion there are scattered small foci of increased trace diffusion signal in both hemispheres. The majority year in the left MCA territory, but right MCA and right MCA/ PCA watershed territory is also affected. Furthermore, there are several similar small foci in the left cerebellar hemisphere. Some of these demonstrate facilitated diffusion on ADC, and T2 and FLAIR hyperintensity (lesion in the posterior left corona radiata on series 5, image 37). Some are restricted.  Deep gray matter nuclei and brainstem are spared. Major intracranial vascular flow voids are preserved with mild intracranial artery dolichoectasia.  No intracranial mass effect. No ventriculomegaly. Cerebral volume is within normal limits for age. Negative pituitary, cervicomedullary junction and visualized cervical spine.  Visible internal auditory structures appear normal. Trace left mastoid effusion. Paranasal sinuses and right mastoid are clear. Negative orbit and scalp.  Nonspecific decreased bone marrow signal at the skullbase and in the visualized cervical spine, patchy geographic pattern. More normal marrow signal in the calvarium.  MRA HEAD FINDINGS  Antegrade flow in the posterior circulation with dominant distal left vertebral artery. Mild distal left vertebral artery atherosclerosis without significant stenosis. Patent vertebrobasilar junction. Normal left PICA. Dominant appearing right AICA. No basilar artery stenosis. Normal SCA and PCA origins. Small posterior communicating arteries. Normal bilateral PCA branches.  Antegrade flow in both ICA siphons. Evidence of calcified plaque without siphon stenosis.  Tortuous but otherwise normal ophthalmic artery origins, more so the left. Patent carotid termini. Normal MCA and ACA origins. Normal posterior communicating artery origins.  Tortuous proximal ACA is. Diminutive or absent anterior communicating artery. Visualized bilateral ACA branches are within normal limits. Mildly tortuous bilateral MCAs. No MCA stenosis or branch occlusion identified. Overall mild generalized intracranial artery dolichoectasia.  IMPRESSION: 1. Scattered small acute to subacute, and occasional early chronic, lacunar infarcts in both MCA territories and the left cerebellum. None of these appear hemorrhagic (cerebral septic emboli are prone to bleed), but consider sequelae of endocarditis and septic emboli in this setting. No significant edema or mass effect. 2. Chronic anterior division left MCA infarct. A small number of chronic micro hemorrhages are identified in both hemispheres an the left cerebellum. 3. Mild for age intracranial atherosclerosis with no stenosis or branch occlusion identified. Mild intracranial artery dolichoectasia.   Electronically Signed   By: Genevie Ann M.D.   On: 10/20/2014 09:49   Mr [name] Contrast  10/20/2014   CLINICAL DATA:  64 year old male with altered mental status, dizziness, loss of balance. Fever,  petechial rash, endocarditis. Initial encounter.  EXAM: MRI HEAD WITHOUT CONTRAST  MRA HEAD WITHOUT CONTRAST  TECHNIQUE: Multiplanar, multiecho pulse sequences of the brain and surrounding structures were obtained without intravenous contrast. Angiographic images of the head were obtained using MRA technique without contrast.  COMPARISON:  Head CT without contrast 08/08/2008.  FINDINGS: MRI HEAD FINDINGS  Wedge-shaped encephalomalacia in the anterior left middle temporal gyrus (series 9, image 19). 4-5 small foci of chronic appearing microhemorrhage, the largest in the left occipital pole on series 11, image 42. On diffusion there are scattered small foci of  increased trace diffusion signal in both hemispheres. The majority year in the left MCA territory, but right MCA and right MCA/ PCA watershed territory is also affected. Furthermore, there are several similar small foci in the left cerebellar hemisphere. Some of these demonstrate facilitated diffusion on ADC, and T2 and FLAIR hyperintensity (lesion in the posterior left corona radiata on series 5, image 37). Some are restricted.  Deep gray matter nuclei and brainstem are spared. Major intracranial vascular flow voids are preserved with mild intracranial artery dolichoectasia.  No intracranial mass effect. No ventriculomegaly. Cerebral volume is within normal limits for age. Negative pituitary, cervicomedullary junction and visualized cervical spine.  Visible internal auditory structures appear normal. Trace left mastoid effusion. Paranasal sinuses and right mastoid are clear. Negative orbit and scalp.  Nonspecific decreased bone marrow signal at the skullbase and in the visualized cervical spine, patchy geographic pattern. More normal marrow signal in the calvarium.  MRA HEAD FINDINGS  Antegrade flow in the posterior circulation with dominant distal left vertebral artery. Mild distal left vertebral artery atherosclerosis without significant stenosis. Patent vertebrobasilar junction. Normal left PICA. Dominant appearing right AICA. No basilar artery stenosis. Normal SCA and PCA origins. Small posterior communicating arteries. Normal bilateral PCA branches.  Antegrade flow in both ICA siphons. Evidence of calcified plaque without siphon stenosis. Tortuous but otherwise normal ophthalmic artery origins, more so the left. Patent carotid termini. Normal MCA and ACA origins. Normal posterior communicating artery origins.  Tortuous proximal ACA is. Diminutive or absent anterior communicating artery. Visualized bilateral ACA branches are within normal limits. Mildly tortuous bilateral MCAs. No MCA stenosis or branch  occlusion identified. Overall mild generalized intracranial artery dolichoectasia.  IMPRESSION: 1. Scattered small acute to subacute, and occasional early chronic, lacunar infarcts in both MCA territories and the left cerebellum. None of these appear hemorrhagic (cerebral septic emboli are prone to bleed), but consider sequelae of endocarditis and septic emboli in this setting. No significant edema or mass effect. 2. Chronic anterior division left MCA infarct. A small number of chronic micro hemorrhages are identified in both hemispheres an the left cerebellum. 3. Mild for age intracranial atherosclerosis with no stenosis or branch occlusion identified. Mild intracranial artery dolichoectasia.   Electronically Signed   By: Genevie Ann M.D.   On: 10/20/2014 09:49    ASSESSMENT AND PLAN  1. 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 ID is on board, currently treating with vancomycin.  - TEE showed MV vegetation on atrial surface of AMVL with mild MR and large mobile vegetation on noncoronary cusp with severe AR with no evidence of abscess. Normal LVF.  - MRA of brian showed - 1. Scattered small acute to subacute, and occasional early chronic, lacunar infarcts in both MCA territories and the left cerebellum. None of these appear hemorrhagic (cerebral septic emboli are prone  to bleed), but consider sequelae of endocarditis and septic emboli in this setting. No significant edema or mass effect. 2. Chronic anterior division left MCA infarct. A small number of chronic micro hemorrhages are identified in both hemispheres an the left cerebellum. 3. Mild for age intracranial atherosclerosis with no stenosis or branch occlusion identified. Mild intracranial artery dolichoectasia.  - GI on board - for colonoscopy in am - CVTS on board - will need right and left heart cath probably Tuesday  2.  Paroxysmal atrial fibrillation and paroxysmal atrial flutter, newly recognized - Going in and out. Was in NSR yesterday but last night went into atrial flutter with RVR and still in it at 140's.  Suspect this is due to acute illness. - TSH normal.  - CHADSVASC now 2. (age >23 and stroke). No anticoagulation for now.  - start IV Amio for rate control and hopefully will convert back to NSR since it has been < 24 hours.  3. Anemia of unknown chronicity, possibly related to above process - Hemoglobin of 10.3 today, was 8.8 on 8/25. Monitor closely.  4. Hyponatremia, improved 5. Recent 20lb weight loss likely due to the above 6. Diarrhea, C diff being ruled out   Sueanne Margarita, MD  10/22/2014  2:19 PM

## 2014-10-22 NOTE — Progress Notes (Addendum)
ANTIBIOTIC CONSULT NOTE  Pharmacy Consult for ampicillin Indication: endocarditis  Allergies  Allergen Reactions  . Penicillins Swelling    Unknown reaction    Patient Measurements: Height: 6' (182.9 cm) Weight: 165 lb 11.2 oz (75.161 kg) IBW/kg (Calculated) : 77.6  Vital Signs: Temp: 98.6 F (37 C) (08/28 1224) Temp Source: Oral (08/28 1224) BP: 125/77 mmHg (08/28 1224) Pulse Rate: 99 (08/28 1224) Intake/Output from previous day: 08/27 0701 - 08/28 0700 In: 2853 [I.V.:2403; IV Piggyback:450] Out: 2700 [Urine:2700] Intake/Output from this shift: Total I/O In: 3 [I.V.:3] Out: 600 [Urine:600]  Labs:  Recent Labs  10/21/14 0236 10/21/14 2048  WBC 11.6* 14.3*  HGB 10.0* 10.3*  PLT 302 294  CREATININE 0.94 1.08   Estimated Creatinine Clearance: 71.6 mL/min (by C-G formula based on Cr of 1.08).  Recent Labs  10/21/14 2048 10/22/14 0345 10/22/14 0904  VANCOTROUGH 16  --   --   GENTTROUGH  --  0.5  --   WGNFAOZH  --   --  2.3*     Microbiology: Recent Results (from the past 720 hour(s))  Blood Culture (routine x 2)     Status: None   Collection Time: 10/18/14  1:11 AM  Result Value Ref Range Status   Specimen Description BLOOD RIGHT ANTECUBITAL  Final   Special Requests BOTTLES DRAWN AEROBIC AND ANAEROBIC 10CC  Final   Culture  Setup Time   Final    GRAM POSITIVE COCCI IN PAIRS IN BOTH AEROBIC AND ANAEROBIC BOTTLES CRITICAL RESULT CALLED TO, READ BACK BY AND VERIFIED WITH: P CARBORNE 10/18/14 @ 37 M VESTAL    Culture ENTEROCOCCUS SPECIES  Final   Report Status 10/20/2014 FINAL  Final   Organism ID, Bacteria ENTEROCOCCUS SPECIES  Final      Susceptibility   Enterococcus species - MIC*    AMPICILLIN <=2 SENSITIVE Sensitive     VANCOMYCIN 1 SENSITIVE Sensitive     GENTAMICIN SYNERGY SENSITIVE Sensitive     LINEZOLID 2 SENSITIVE Sensitive     * ENTEROCOCCUS SPECIES  Blood Culture (routine x 2)     Status: None   Collection Time: 10/18/14  1:19 AM   Result Value Ref Range Status   Specimen Description BLOOD RIGHT WRIST  Final   Special Requests BOTTLES DRAWN AEROBIC AND ANAEROBIC 10CC  Final   Culture  Setup Time   Final    GRAM POSITIVE COCCI IN PAIRS IN BOTH AEROBIC AND ANAEROBIC BOTTLES CRITICAL RESULT CALLED TO, READ BACK BY AND VERIFIED WITH: P CARBORNE 10/18/14 @ 48 M VESTAL    Culture   Final    ENTEROCOCCUS SPECIES SUSCEPTIBILITIES PERFORMED ON PREVIOUS CULTURE WITHIN THE LAST 5 DAYS.    Report Status 10/20/2014 FINAL  Final  Urine culture     Status: None   Collection Time: 10/18/14  2:56 AM  Result Value Ref Range Status   Specimen Description URINE, CATHETERIZED  Final   Special Requests NONE  Final   Culture 20,000 COLONIES/mL ENTEROCOCCUS FAECALIS  Final   Report Status 10/20/2014 FINAL  Final   Organism ID, Bacteria ENTEROCOCCUS FAECALIS  Final      Susceptibility   Enterococcus faecalis - MIC*    AMPICILLIN <=2 SENSITIVE Sensitive     LEVOFLOXACIN 1 SENSITIVE Sensitive     NITROFURANTOIN <=16 SENSITIVE Sensitive     VANCOMYCIN 1 SENSITIVE Sensitive     LINEZOLID 2 SENSITIVE Sensitive     * 20,000 COLONIES/mL ENTEROCOCCUS FAECALIS  MRSA PCR Screening  Status: None   Collection Time: 10/18/14  5:58 PM  Result Value Ref Range Status   MRSA by PCR NEGATIVE NEGATIVE Final    Comment:        The GeneXpert MRSA Assay (FDA approved for NASAL specimens only), is one component of a comprehensive MRSA colonization surveillance program. It is not intended to diagnose MRSA infection nor to guide or monitor treatment for MRSA infections.   Culture, blood (routine x 2)     Status: None (Preliminary result)   Collection Time: 10/20/14 11:10 AM  Result Value Ref Range Status   Specimen Description BLOOD RIGHT ARM  Final   Special Requests BOTTLES DRAWN AEROBIC AND ANAEROBIC 5CC  Final   Culture NO GROWTH 1 DAY  Final   Report Status PENDING  Incomplete  Culture, blood (routine x 2)     Status: None  (Preliminary result)   Collection Time: 10/20/14 11:26 AM  Result Value Ref Range Status   Specimen Description BLOOD LEFT HAND  Final   Special Requests BOTTLES DRAWN AEROBIC AND ANAEROBIC 5CC  Final   Culture NO GROWTH 1 DAY  Final   Report Status PENDING  Incomplete   Assessment: 64 yo M presented on 8/24 with AMS, dizziness, and rashes. Found to have endocarditis. Initially started on vancomycin + gentamicin due to history of penicillin allergy but now attempting transition to ampicillin. Pt does not have a clear memory of what his allergic reaction is.   /26 Blood - pending 8/24 Urine cx > 20k enterococcus 8/24 Blood cx > enterococcus (vanc MIC 1) 8/24 RMSF>> IP  8/24 B. 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 Per RN, patient tolerated his dose of ampicillin today. Start Ceftriaxone 2 gm IV Q 24 hours.   Albertina Parr, PharmD., BCPS Clinical Pharmacist Pager [phone]

## 2014-10-22 NOTE — Consult Note (Signed)
[facility] CONSULT Reason for consult: evaluate for G.I. source Of Enterococcus Bacteremia Referring Physician: [facility] hospital[facility]. PCP: [name] MD. Primary G.I. in [place] is an 64 y.o. male.  HPI: He was admitted 8/24 with altered mental status and dizziness. Extensive workup is revealed positive blood cultures for enterococcus. 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 Has been seen by cardiology, infectious disease, and cardiovascular surgery. Very extensive workup is been performed and it is anticipated that he will need surgery on his aortic valve to the vegetation's on the valve and severe aortic insufficiency. There is only some very mild mitral insufficiency. He has had several small septic emboli to the brain. Were asked to see him regarding evaluating for the source of his enterococcus infection. The patient reports that he had a colonoscopy done in [place] approximately 2008 and he was told that this was normal and he needed a 10 year follow-up. He's never had diverticulitis or any problems with his gallbladder. To the best of his knowledge she's never had his gallbladder checked. He has had prostate cancer. He is not aware of any family history of gallstones or colon cancer. He's had no change in bowel habits of G.I. bleeding etc. Past Medical History  Diagnosis Date  . Restless leg syndrome   . Prostate cancer   . 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 Paroxysmal atrial fibrillation 10/19/2014  . Paroxysmal atrial flutter 10/19/2014  . Enterococcal bacteremia 10/19/2014    Past Surgical History  Procedure Laterality Date  . Prostatectomy       Family History  Problem Relation Age of Onset  . CAD Neg Hx     Social History:  reports that he has quit smoking. He does not have any smokeless tobacco history on file. He reports that he does not drink alcohol or use illicit drugs.  Allergies:  Allergies  Allergen Reactions  . Penicillins Swelling    Taste buds on tongue swell    Medications; Prior to Admission medications   Medication Sig Start Date End Date Taking? Authorizing Provider  pramipexole (MIRAPEX) 1.5 MG tablet Take 1.5 mg by mouth at bedtime.   Yes Historical Provider, MD   . chlorhexidine  15 mL Mouth/Throat BID  . diltiazem  10 mg Intravenous Once  . gentamicin  80 mg Intravenous Q12H  . heparin  5,000 Units Subcutaneous 3 times per day  . pramipexole  1.5 mg Oral QHS  . sodium chloride  3 mL Intravenous Q12H  . vancomycin  1,000 mg Intravenous Q12H   PRN Meds acetaminophen, ondansetron **OR** ondansetron (ZOFRAN) IV, oxyCODONE Results for orders placed or performed during the hospital encounter of 10/17/14 (from the past 48 hour(s))  Culture, blood (routine x 2)     Status: None (Preliminary result)   Collection Time: 10/20/14 11:10 AM  Result Value Ref Range   Specimen Description BLOOD RIGHT ARM    Special Requests BOTTLES DRAWN AEROBIC AND ANAEROBIC 5CC    Culture NO GROWTH 1 DAY    Report Status PENDING   Culture, blood (routine x 2)     Status: None (Preliminary result)   Collection Time: 10/20/14 11:26 AM  Result Value Ref Range   Specimen Description BLOOD  LEFT HAND    Special Requests BOTTLES DRAWN AEROBIC AND ANAEROBIC 5CC    Culture NO GROWTH 1 DAY    Report Status PENDING   CBC     Status: Abnormal   Collection Time: 10/21/14  2:36 AM  Result Value Ref Range   WBC 11.6 (H) 4.0 - 10.5 K/uL   RBC 3.72 (L) 4.22 - 5.81 MIL/uL   Hemoglobin 10.0 (L) 13.0 - 17.0 g/dL   HCT 30.9 (L) 39.0 - 52.0 %   MCV 83.1 78.0 - 100.0 fL   MCH 26.9 26.0 - 34.0 pg   MCHC 32.4 30.0 - 36.0 g/dL   RDW  14.9 11.5 - 15.5 %   Platelets 302 150 - 400 K/uL  Basic metabolic panel     Status: Abnormal   Collection Time: 10/21/14  2:36 AM  Result Value Ref Range   Sodium 134 (L) 135 - 145 mmol/L   Potassium 4.3 3.5 - 5.1 mmol/L   Chloride 103 101 - 111 mmol/L   CO2 26 22 - 32 mmol/L   Glucose, Bld 109 (H) 65 - 99 mg/dL   BUN 5 (L) 6 - 20 mg/dL   Creatinine, Ser 0.94 0.61 - 1.24 mg/dL   Calcium 8.4 (L) 8.9 - 10.3 mg/dL   GFR calc non Af Amer >60 >60 mL/min   GFR calc Af Amer >60 >60 mL/min    Comment: (NOTE) The eGFR has been calculated using the CKD EPI equation. This calculation has not been validated in all clinical situations. eGFR's persistently <60 mL/min signify possible Chronic Kidney Disease.    Anion gap 5 5 - 15  Glucose, capillary     Status: Abnormal   Collection Time: 10/21/14  7:50 AM  Result Value Ref Range   Glucose-Capillary 100 (H) 65 - 99 mg/dL  Vancomycin, trough     Status: None   Collection Time: 10/21/14  8:48 PM  Result Value Ref Range   Vancomycin Tr 16 10.0 - 20.0 ug/mL  CBC with Differential/Platelet     Status: Abnormal   Collection Time: 10/21/14  8:48 PM  Result Value Ref Range   WBC 14.3 (H) 4.0 - 10.5 K/uL   RBC 3.69 (L) 4.22 - 5.81 MIL/uL   Hemoglobin 10.3 (L) 13.0 - 17.0 g/dL   HCT 31.0 (L) 39.0 - 52.0 %   MCV 84.0 78.0 - 100.0 fL   MCH 27.9 26.0 - 34.0 pg   MCHC 33.2 30.0 - 36.0 g/dL   RDW 15.2 11.5 - 15.5 %   Platelets 294 150 - 400 K/uL   Neutrophils Relative % 76 43 - 77 %   Neutro Abs 10.7 (H) 1.7 - 7.7 K/uL   Lymphocytes Relative 12 12 - 46 %   Lymphs Abs 1.7 0.7 - 4.0 K/uL   Monocytes Relative 12 3 - 12 %   Monocytes Absolute 1.8 (H) 0.1 - 1.0 K/uL   Eosinophils Relative 0 0 - 5 %   Eosinophils Absolute 0.1 0.0 - 0.7 K/uL   Basophils Relative 0 0 - 1 %   Basophils Absolute 0.0 0.0 - 0.1 K/uL  Comprehensive metabolic panel     Status: Abnormal   Collection Time: 10/21/14  8:48 PM  Result Value Ref Range   Sodium 133 (L) 135 -  145 mmol/L   Potassium 4.3 3.5 - 5.1 mmol/L   Chloride 100 (L) 101 - 111 mmol/L   CO2 28 22 - 32 mmol/L   Glucose, Bld 119 (H) 65 -  99 mg/dL   BUN 6 6 - 20 mg/dL   Creatinine, Ser 1.08 0.61 - 1.24 mg/dL   Calcium 8.5 (L) 8.9 - 10.3 mg/dL   Total Protein 5.7 (L) 6.5 - 8.1 g/dL   Albumin 1.9 (L) 3.5 - 5.0 g/dL   AST 49 (H) 15 - 41 U/L   ALT 28 17 - 63 U/L   Alkaline Phosphatase 85 38 - 126 U/L   Total Bilirubin 0.5 0.3 - 1.2 mg/dL   GFR calc non Af Amer >60 >60 mL/min   GFR calc Af Amer >60 >60 mL/min    Comment: (NOTE) The eGFR has been calculated using the CKD EPI equation. This calculation has not been validated in all clinical situations. eGFR's persistently <60 mL/min signify possible Chronic Kidney Disease.    Anion gap 5 5 - 15  Troponin I     Status: Abnormal   Collection Time: 10/21/14  8:48 PM  Result Value Ref Range   Troponin I 0.28 (H) <0.031 ng/mL    Comment:        PERSISTENTLY INCREASED TROPONIN VALUES IN THE RANGE OF 0.04-0.49 ng/mL CAN BE SEEN IN:       -UNSTABLE ANGINA       -CONGESTIVE HEART FAILURE       -MYOCARDITIS       -CHEST TRAUMA       -ARRYHTHMIAS       -LATE PRESENTING MYOCARDIAL INFARCTION       -COPD   CLINICAL FOLLOW-UP RECOMMENDED.   Magnesium     Status: None   Collection Time: 10/21/14  8:48 PM  Result Value Ref Range   Magnesium 1.7 1.7 - 2.4 mg/dL  Phosphorus     Status: None   Collection Time: 10/21/14  8:48 PM  Result Value Ref Range   Phosphorus 3.4 2.5 - 4.6 mg/dL  Troponin I     Status: Abnormal   Collection Time: 10/22/14  1:46 AM  Result Value Ref Range   Troponin I 0.25 (H) <0.031 ng/mL    Comment:        PERSISTENTLY INCREASED TROPONIN VALUES IN THE RANGE OF 0.04-0.49 ng/mL CAN BE SEEN IN:       -UNSTABLE ANGINA       -CONGESTIVE HEART FAILURE       -MYOCARDITIS       -CHEST TRAUMA       -ARRYHTHMIAS       -LATE PRESENTING MYOCARDIAL INFARCTION       -COPD   CLINICAL FOLLOW-UP RECOMMENDED.   Gentamicin  level, trough     Status: None   Collection Time: 10/22/14  3:45 AM  Result Value Ref Range   Gentamicin Trough 0.5 0.5 - 2.0 ug/mL    Comment: Performed at [place]  Glucose, capillary     Status: Abnormal   Collection Time: 10/22/14  8:06 AM  Result Value Ref Range   Glucose-Capillary 108 (H) 65 - 99 mg/dL    Dg Orthopantogram  10/21/2014   CLINICAL DATA:  Dental anomaly.  Endocarditis.  EXAM: ORTHOPANTOGRAM/PANORAMIC  COMPARISON:  None.  FINDINGS: No fracture or bone lesion.  There are multiple metal fillings. Dentition is otherwise unremarkable with no periapical lucency to suggest a dental abscess. All third molars have been removed. Second and third molar have been removed the right axilla.  IMPRESSION: 1. No fracture or bone lesion. No evidence of a dental abscess or untreated carie.   Electronically Signed   By: Shanon Brow  Ormond M.D.   On: 10/21/2014 10:09               Blood pressure 130/77, pulse 103, temperature 98.5 F (36.9 C), temperature source Oral, resp. rate 22, height 6' (1.829 m), weight 75.161 kg (165 lb 11.2 oz), SpO2 97 %.  Physical exam:   General-- pleasant thin white male reading the newspaper  ENT-- sclera nonicteric  Neck-- supple with full range of motion  Heart-- systolic murmur heard  Lungs-- clear  Abdomen-- soft and nontender   Assessment: 1. Enterococcus endocarditis. Source of this infection is unclear. Agree G.I. tract needs to be worked out. Prime considerations will be: or gallbladder. 2. Prostate cancer status post prostatectomy 3. Paroxysmal atrial fibrillation. This apparently is a new condition and he has reverted back to sinus rhythm. 4. Aortic valve vegetations and significant aortic insufficiency. Likely need surgery on this admission.  Plan: I have discussed with Dr.Owen who felt that he would be acceptable for colonoscopy. We'll try to get this scheduled tomorrow Dr. Paulita Fujita will perform the procedure.  Hopefully we will be able to use propofol anesthesia. Have discussed with patient. We'll also need gallbladder ultrasound.  Elan Brainerd JR,Keltin Baird L 10/22/2014, 10:22 AM   Pager: [phone] If no answer or after hours call [phone]

## 2014-10-22 NOTE — Progress Notes (Signed)
PROGRESS NOTE  Alan [name] PPI:[id number] DOB: Aug 27, 1950 DOA: 10/17/2014 PCP: [name], MD  DIETER HANE is a 64 y.o. male with PMH of prostate cancer, RLS, who resents result in mental status, dizziness and a rash. Per patient's wife, pt just hasn't felt well lately. Patient has been having dizziness and poor balance in the past 2-3 weeks. No unilateral weakness, vision change or hearing loss. Patient recently did a lot of yard work in [employer], such as cutting trees. He could have had insect bite per his wife. Today patient become confused and lethargic, and was found asleep by security guard at work (works at [employer]) at about 9:30 PM.  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  Acute resp failure - wean O2 as tolerated  Diarrhea -GI pathogen panel negative  Sepsis: resolved -+ blood cultures  Restless leg syndrome: -Pramipexole  Normocytic anemia: hgb 15.7 on 2010-->9.2. FOBT negative. -check anemia panel  Dizziness:  -see MRI  Code Status: full Family Communication: patient/  Wife 8/26 Disposition Plan: remain in SDU   Consultants:    Procedures:     HPI/Subjective: Did not sleep well last PM  Objective: Filed Vitals:   10/22/14 0444  BP: 106/68  Pulse: 116   Temp: 97.9 F (36.6 C)  Resp: 18    Intake/Output Summary (Last 24 hours) at 10/22/14 0807 Last data filed at 10/22/14 0700  Gross per 24 hour  Intake   2753 ml  Output   2400 ml  Net    353 ml   Filed Weights   10/18/14 0012 10/18/14 1241  Weight: 74.844 kg (165 lb) 75.161 kg (165 lb 11.2 oz)    Exam:   General:   NAD  Cardiovascular: rrr  Respiratory: clear  Abdomen: +BS, soft  Musculoskeletal: no edema  Data Reviewed: Basic Metabolic Panel:  Recent Labs Lab 10/18/14 0125 10/18/14 1008 10/19/14 0310 10/21/14 0236 10/21/14 2048  NA 129*  --  134* 134* 133*  K 4.0  --  3.7 4.3 4.3  CL 100*  --  106 103 100*  CO2 21*  --  22 26 28   GLUCOSE 118*  --  122* 109* 119*  BUN 12  --  10 5* 6  CREATININE 1.20  --  0.94 0.94 1.08  CALCIUM 8.3*  --  8.4* 8.4* 8.5*  MG  --  1.7  --   --  1.7  PHOS  --   --   --   --  3.4   Liver Function Tests:  Recent Labs Lab 10/18/14 0125 10/21/14 2048  AST 22 49*  ALT 18 28  ALKPHOS 40 85  BILITOT 0.8 0.5  PROT 6.4* 5.7*  ALBUMIN 2.4* 1.9*    Recent Labs Lab 10/18/14 0125  LIPASE 72*   No results for input(s): AMMONIA in the last 168 hours.  CBC:  Recent Labs Lab 10/18/14 0125 10/19/14 0310 10/21/14 0236 10/21/14 2048  WBC 8.9 6.3 11.6* 14.3*  NEUTROABS 7.0  --   --  10.7*  HGB 9.2* 8.8* 10.0* 10.3*  HCT 28.4* 27.2* 30.9* 31.0*  MCV 84.3 84.2 83.1 84.0  PLT 231 212 302 294   Cardiac Enzymes:  Recent Labs Lab 10/21/14 2048 10/22/14 0146  TROPONINI 0.28* 0.25*   BNP (last 3 results) No results for input(s): BNP in the last 8760 hours.  ProBNP (last 3 results) No results for input(s): PROBNP in the last 8760 hours.  CBG:  Recent Labs Lab 10/19/14 0811 10/20/14 0810 10/21/14 0750  GLUCAP 109* 99 100*    Recent Results (from the past 240 hour(s))  Blood Culture (routine x 2)     Status: None   Collection Time: 10/18/14  1:11 AM  Result Value Ref Range Status   Specimen Description  BLOOD RIGHT ANTECUBITAL  Final   Special Requests BOTTLES DRAWN AEROBIC AND ANAEROBIC 10CC  Final   Culture  Setup Time   Final    GRAM POSITIVE COCCI IN PAIRS IN BOTH AEROBIC AND ANAEROBIC BOTTLES CRITICAL RESULT CALLED TO, READ BACK BY AND VERIFIED WITH: P CARBORNE 10/18/14 @ 39 M VESTAL    Culture ENTEROCOCCUS SPECIES  Final   Report Status 10/20/2014 FINAL  Final   Organism ID, Bacteria ENTEROCOCCUS SPECIES  Final      Susceptibility   Enterococcus species - MIC*    AMPICILLIN <=2 SENSITIVE Sensitive     VANCOMYCIN 1 SENSITIVE Sensitive     GENTAMICIN SYNERGY SENSITIVE Sensitive     LINEZOLID 2 SENSITIVE Sensitive     * ENTEROCOCCUS SPECIES  Blood Culture (routine x 2)     Status: None   Collection Time: 10/18/14  1:19 AM  Result Value Ref Range Status   Specimen Description BLOOD RIGHT WRIST  Final   Special Requests BOTTLES DRAWN AEROBIC AND ANAEROBIC 10CC  Final   Culture  Setup Time   Final    GRAM POSITIVE COCCI IN PAIRS IN BOTH AEROBIC AND ANAEROBIC BOTTLES CRITICAL RESULT CALLED TO, READ BACK BY AND VERIFIED WITH: P CARBORNE 10/18/14 @ 80 M VESTAL    Culture   Final    ENTEROCOCCUS SPECIES SUSCEPTIBILITIES PERFORMED ON PREVIOUS CULTURE WITHIN THE LAST 5 DAYS.    Report Status 10/20/2014 FINAL  Final  Urine culture     Status: None   Collection Time: 10/18/14  2:56 AM  Result Value Ref Range Status   Specimen Description URINE, CATHETERIZED  Final   Special Requests NONE  Final   Culture 20,000 COLONIES/mL ENTEROCOCCUS FAECALIS  Final   Report Status 10/20/2014 FINAL  Final   Organism ID, Bacteria ENTEROCOCCUS FAECALIS  Final      Susceptibility   Enterococcus faecalis - MIC*    AMPICILLIN <=2 SENSITIVE Sensitive     LEVOFLOXACIN 1 SENSITIVE Sensitive     NITROFURANTOIN <=16 SENSITIVE Sensitive     VANCOMYCIN 1 SENSITIVE Sensitive     LINEZOLID 2 SENSITIVE Sensitive     * 20,000 COLONIES/mL ENTEROCOCCUS FAECALIS  MRSA PCR Screening     Status: None    Collection Time: 10/18/14  5:58 PM  Result Value Ref Range Status   MRSA by PCR NEGATIVE NEGATIVE Final    Comment:        The GeneXpert MRSA Assay (FDA approved for NASAL specimens only), is one component of a comprehensive MRSA colonization surveillance program. It is not intended  to diagnose MRSA infection nor to guide or monitor treatment for MRSA infections.   Culture, blood (routine x 2)     Status: None (Preliminary result)   Collection Time: 10/20/14 11:10 AM  Result Value Ref Range Status   Specimen Description BLOOD RIGHT ARM  Final   Special Requests BOTTLES DRAWN AEROBIC AND ANAEROBIC 5CC  Final   Culture NO GROWTH 1 DAY  Final   Report Status PENDING  Incomplete  Culture, blood (routine x 2)     Status: None (Preliminary result)   Collection Time: 10/20/14 11:26 AM  Result Value Ref Range Status   Specimen Description BLOOD LEFT HAND  Final   Special Requests BOTTLES DRAWN AEROBIC AND ANAEROBIC 5CC  Final   Culture NO GROWTH 1 DAY  Final   Report Status PENDING  Incomplete     Studies: Dg Orthopantogram  10/21/2014   CLINICAL DATA:  Dental anomaly.  Endocarditis.  EXAM: ORTHOPANTOGRAM/PANORAMIC  COMPARISON:  None.  FINDINGS: No fracture or bone lesion.  There are multiple metal fillings. Dentition is otherwise unremarkable with no periapical lucency to suggest a dental abscess. All third molars have been removed. Second and third molar have been removed the right axilla.  IMPRESSION: 1. No fracture or bone lesion. No evidence of a dental abscess or untreated carie.   Electronically Signed   By: Lajean Manes M.D.   On: 10/21/2014 10:09   [name] Head Wo Contrast  10/20/2014   CLINICAL DATA:  64 year old male with altered mental status, dizziness, loss of balance. Fever, petechial rash, endocarditis. Initial encounter.  EXAM: MRI HEAD WITHOUT CONTRAST  MRA HEAD WITHOUT CONTRAST  TECHNIQUE: Multiplanar, multiecho pulse sequences of the brain and surrounding structures  were obtained without intravenous contrast. Angiographic images of the head were obtained using MRA technique without contrast.  COMPARISON:  Head CT without contrast 08/08/2008.  FINDINGS: MRI HEAD FINDINGS  Wedge-shaped encephalomalacia in the anterior left middle temporal gyrus (series 9, image 19). 4-5 small foci of chronic appearing microhemorrhage, the largest in the left occipital pole on series 11, image 42. On diffusion there are scattered small foci of increased trace diffusion signal in both hemispheres. The majority year in the left MCA territory, but right MCA and right MCA/ PCA watershed territory is also affected. Furthermore, there are several similar small foci in the left cerebellar hemisphere. Some of these demonstrate facilitated diffusion on ADC, and T2 and FLAIR hyperintensity (lesion in the posterior left corona radiata on series 5, image 37). Some are restricted.  Deep gray matter nuclei and brainstem are spared. Major intracranial vascular flow voids are preserved with mild intracranial artery dolichoectasia.  No intracranial mass effect. No ventriculomegaly. Cerebral volume is within normal limits for age. Negative pituitary, cervicomedullary junction and visualized cervical spine.  Visible internal auditory structures appear normal. Trace left mastoid effusion. Paranasal sinuses and right mastoid are clear. Negative orbit and scalp.  Nonspecific decreased bone marrow signal at the skullbase and in the visualized cervical spine, patchy geographic pattern. More normal marrow signal in the calvarium.  MRA HEAD FINDINGS  Antegrade flow in the posterior circulation with dominant distal left vertebral artery. Mild distal left vertebral artery atherosclerosis without significant stenosis. Patent vertebrobasilar junction. Normal left PICA. Dominant appearing right AICA. No basilar artery stenosis. Normal SCA and PCA origins. Small posterior communicating arteries. Normal bilateral PCA branches.   Antegrade flow in both ICA siphons. Evidence of calcified plaque without siphon stenosis. Tortuous but otherwise normal ophthalmic artery origins,  more so the left. Patent carotid termini. Normal MCA and ACA origins. Normal posterior communicating artery origins.  Tortuous proximal ACA is. Diminutive or absent anterior communicating artery. Visualized bilateral ACA branches are within normal limits. Mildly tortuous bilateral MCAs. No MCA stenosis or branch occlusion identified. Overall mild generalized intracranial artery dolichoectasia.  IMPRESSION: 1. Scattered small acute to subacute, and occasional early chronic, lacunar infarcts in both MCA territories and the left cerebellum. None of these appear hemorrhagic (cerebral septic emboli are prone to bleed), but consider sequelae of endocarditis and septic emboli in this setting. No significant edema or mass effect. 2. Chronic anterior division left MCA infarct. A small number of chronic micro hemorrhages are identified in both hemispheres an the left cerebellum. 3. Mild for age intracranial atherosclerosis with no stenosis or branch occlusion identified. Mild intracranial artery dolichoectasia.   Electronically Signed   By: Genevie Ann M.D.   On: 10/20/2014 09:49   Mr [name] Contrast  10/20/2014   CLINICAL DATA:  64 year old male with altered mental status, dizziness, loss of balance. Fever, petechial rash, endocarditis. Initial encounter.  EXAM: MRI HEAD WITHOUT CONTRAST  MRA HEAD WITHOUT CONTRAST  TECHNIQUE: Multiplanar, multiecho pulse sequences of the brain and surrounding structures were obtained without intravenous contrast. Angiographic images of the head were obtained using MRA technique without contrast.  COMPARISON:  Head CT without contrast 08/08/2008.  FINDINGS: MRI HEAD FINDINGS  Wedge-shaped encephalomalacia in the anterior left middle temporal gyrus (series 9, image 19). 4-5 small foci of chronic appearing microhemorrhage, the largest in the left  occipital pole on series 11, image 42. On diffusion there are scattered small foci of increased trace diffusion signal in both hemispheres. The majority year in the left MCA territory, but right MCA and right MCA/ PCA watershed territory is also affected. Furthermore, there are several similar small foci in the left cerebellar hemisphere. Some of these demonstrate facilitated diffusion on ADC, and T2 and FLAIR hyperintensity (lesion in the posterior left corona radiata on series 5, image 37). Some are restricted.  Deep gray matter nuclei and brainstem are spared. Major intracranial vascular flow voids are preserved with mild intracranial artery dolichoectasia.  No intracranial mass effect. No ventriculomegaly. Cerebral volume is within normal limits for age. Negative pituitary, cervicomedullary junction and visualized cervical spine.  Visible internal auditory structures appear normal. Trace left mastoid effusion. Paranasal sinuses and right mastoid are clear. Negative orbit and scalp.  Nonspecific decreased bone marrow signal at the skullbase and in the visualized cervical spine, patchy geographic pattern. More normal marrow signal in the calvarium.  MRA HEAD FINDINGS  Antegrade flow in the posterior circulation with dominant distal left vertebral artery. Mild distal left vertebral artery atherosclerosis without significant stenosis. Patent vertebrobasilar junction. Normal left PICA. Dominant appearing right AICA. No basilar artery stenosis. Normal SCA and PCA origins. Small posterior communicating arteries. Normal bilateral PCA branches.  Antegrade flow in both ICA siphons. Evidence of calcified plaque without siphon stenosis. Tortuous but otherwise normal ophthalmic artery origins, more so the left. Patent carotid termini. Normal MCA and ACA origins. Normal posterior communicating artery origins.  Tortuous proximal ACA is. Diminutive or absent anterior communicating artery. Visualized bilateral ACA branches are  within normal limits. Mildly tortuous bilateral MCAs. No MCA stenosis or branch occlusion identified. Overall mild generalized intracranial artery dolichoectasia.  IMPRESSION: 1. Scattered small acute to subacute, and occasional early chronic, lacunar infarcts in both MCA territories and the left cerebellum. None of these appear hemorrhagic (cerebral septic emboli  are prone to bleed), but consider sequelae of endocarditis and septic emboli in this setting. No significant edema or mass effect. 2. Chronic anterior division left MCA infarct. A small number of chronic micro hemorrhages are identified in both hemispheres an the left cerebellum. 3. Mild for age intracranial atherosclerosis with no stenosis or branch occlusion identified. Mild intracranial artery dolichoectasia.   Electronically Signed   By: Genevie Ann M.D.   On: 10/20/2014 09:49    Scheduled Meds: . chlorhexidine  15 mL Mouth/Throat BID  . gentamicin  80 mg Intravenous Q12H  . heparin  5,000 Units Subcutaneous 3 times per day  . pramipexole  1.5 mg Oral QHS  . sodium chloride  3 mL Intravenous Q12H  . vancomycin  1,000 mg Intravenous Q12H   Continuous Infusions: . sodium chloride 100 mL/hr at 10/22/14 0611   Antibiotics Given (last 72 hours)    Date/Time Action Medication Dose Rate   10/19/14 0902 Given   doxycycline (VIBRAMYCIN) 100 mg in dextrose 5 % 250 mL IVPB 100 mg 125 mL/hr   10/19/14 0903 Given   vancomycin (VANCOCIN) 1,500 mg in sodium chloride 0.9 % 500 mL IVPB 1,500 mg 250 mL/hr   10/19/14 2200 Given   vancomycin (VANCOCIN) IVPB 1000 mg/200 mL premix 1,000 mg 200 mL/hr   10/20/14 1036 Given   vancomycin (VANCOCIN) IVPB 1000 mg/200 mL premix 1,000 mg 200 mL/hr   10/20/14 1808 Given   gentamicin (GARAMYCIN) IVPB 80 mg 80 mg 100 mL/hr   10/20/14 2138 Given   vancomycin (VANCOCIN) IVPB 1000 mg/200 mL premix 1,000 mg 200 mL/hr   10/21/14 0636 Given   gentamicin (GARAMYCIN) IVPB 80 mg 80 mg 100 mL/hr   10/21/14 1014 Given    vancomycin (VANCOCIN) IVPB 1000 mg/200 mL premix 1,000 mg 200 mL/hr   10/21/14 1747 Given   gentamicin (GARAMYCIN) IVPB 80 mg 80 mg 100 mL/hr   10/21/14 2116 Given   vancomycin (VANCOCIN) IVPB 1000 mg/200 mL premix 1,000 mg 200 mL/hr   10/22/14 1594 Given   gentamicin (GARAMYCIN) IVPB 80 mg 80 mg 100 mL/hr      Principal Problem:   Enterococcal bacteremia Active Problems:   Restless leg syndrome   Prostate cancer   Acute encephalopathy   Rash   Sepsis   Normocytic anemia   Dizziness   Acute respiratory failure   Aortic valve endocarditis - Enterococcus   Endocarditis of mitral valve - Enterococcus   Paroxysmal atrial fibrillation   Paroxysmal atrial flutter   Atherosclerotic peripheral vascular disease   Aortic valve insufficiency, severe   Mitral valve regurgitation, infectious    Time spent: 35 min    Shaela Boer, [facility] Pager [phone]. If 7PM-7AM, please contact night-coverage at [email or web address], password [facility] 10/22/2014, 8:07 AM  LOS: 4 days

## 2014-10-23 ENCOUNTER — Inpatient Hospital Stay (HOSPITAL_COMMUNITY): Payer: BC Managed Care – PPO

## 2014-10-23 ENCOUNTER — Encounter (HOSPITAL_COMMUNITY)
Admission: EM | Disposition: A | Discharge status: Home Health | Payer: Self-pay | Source: Home / Self Care | Attending: Thoracic Surgery (Cardiothoracic Vascular Surgery)

## 2014-10-23 ENCOUNTER — Other Ambulatory Visit: Payer: Self-pay | Admitting: *Deleted

## 2014-10-23 ENCOUNTER — Encounter (HOSPITAL_COMMUNITY): Payer: Self-pay | Admitting: Certified Registered"

## 2014-10-23 DIAGNOSIS — G08 Intracranial and intraspinal phlebitis and thrombophlebitis: Secondary | ICD-10-CM

## 2014-10-23 DIAGNOSIS — I76 Septic arterial embolism: Secondary | ICD-10-CM | POA: Diagnosis present

## 2014-10-23 DIAGNOSIS — R7989 Other specified abnormal findings of blood chemistry: Secondary | ICD-10-CM

## 2014-10-23 DIAGNOSIS — I330 Acute and subacute infective endocarditis: Secondary | ICD-10-CM

## 2014-10-23 DIAGNOSIS — I358 Other nonrheumatic aortic valve disorders: Secondary | ICD-10-CM

## 2014-10-23 DIAGNOSIS — D72829 Elevated white blood cell count, unspecified: Secondary | ICD-10-CM | POA: Diagnosis present

## 2014-10-23 DIAGNOSIS — R778 Other specified abnormalities of plasma proteins: Secondary | ICD-10-CM | POA: Diagnosis present

## 2014-10-23 DIAGNOSIS — Z9861 Coronary angioplasty status: Secondary | ICD-10-CM | POA: Diagnosis present

## 2014-10-23 DIAGNOSIS — I2510 Atherosclerotic heart disease of native coronary artery without angina pectoris: Secondary | ICD-10-CM | POA: Diagnosis present

## 2014-10-23 HISTORY — DX: Atherosclerotic heart disease of native coronary artery without angina pectoris: I25.10

## 2014-10-23 HISTORY — PX: CARDIAC CATHETERIZATION: SHX172

## 2014-10-23 LAB — BASIC METABOLIC PANEL
Anion gap: 5 (ref 5–15)
CALCIUM: 8.6 mg/dL — AB (ref 8.9–10.3)
CO2: 28 mmol/L (ref 22–32)
CREATININE: 1 mg/dL (ref 0.61–1.24)
Chloride: 98 mmol/L — ABNORMAL LOW (ref 101–111)
GFR calc Af Amer: >60 mL/min (ref >60)
GLUCOSE: 125 mg/dL — AB (ref 65–99)
POTASSIUM: 4.6 mmol/L (ref 3.5–5.1)
SODIUM: 131 mmol/L — AB (ref 135–145)

## 2014-10-23 LAB — TROPONIN I
TROPONIN I: 0.15 ng/mL — AB (ref <0.031)
TROPONIN I: 0.11 ng/mL — AB (ref <0.031)
Troponin I: 0.12 ng/mL — ABNORMAL HIGH (ref <0.031)
Troponin I: 0.09 ng/mL — ABNORMAL HIGH (ref <0.031)

## 2014-10-23 LAB — POCT I-STAT 3, VENOUS BLOOD GAS (G3P V)
Acid-Base Excess: 2 mmol/L (ref 0.0–2.0)
BICARBONATE: 27 meq/L — AB (ref 20.0–24.0)
O2 Saturation: 54 %
PCO2 VEN: 42.6 mmHg — AB (ref 45.0–50.0)
PH VEN: 7.411 — AB (ref 7.250–7.300)
PO2 VEN: 28 mmHg — AB (ref 30.0–45.0)
TCO2: 28 mmol/L (ref 0–100)

## 2014-10-23 LAB — POCT I-STAT 3, ART BLOOD GAS (G3+)
Acid-Base Excess: 1 mmol/L (ref 0.0–2.0)
BICARBONATE: 25.6 meq/L — AB (ref 20.0–24.0)
O2 Saturation: 87 %
PCO2 ART: 37.5 mmHg (ref 35.0–45.0)
PH ART: 7.442 (ref 7.350–7.450)
PO2 ART: 50 mmHg — AB (ref 80.0–100.0)
TCO2: 27 mmol/L (ref 0–100)

## 2014-10-23 LAB — CBC
HCT: 30.5 % — ABNORMAL LOW (ref 39.0–52.0)
Hemoglobin: 10 g/dL — ABNORMAL LOW (ref 13.0–17.0)
MCH: 27.7 pg (ref 26.0–34.0)
MCHC: 32.8 g/dL (ref 30.0–36.0)
MCV: 84.5 fL (ref 78.0–100.0)
PLATELETS: 324 10*3/uL (ref 150–400)
RBC: 3.61 MIL/uL — ABNORMAL LOW (ref 4.22–5.81)
RDW: 15.4 % (ref 11.5–15.5)
WBC: 15.3 10*3/uL — ABNORMAL HIGH (ref 4.0–10.5)

## 2014-10-23 LAB — GLUCOSE, CAPILLARY: Glucose-Capillary: 108 mg/dL — ABNORMAL HIGH (ref 65–99)

## 2014-10-23 SURGERY — RIGHT/LEFT HEART CATH AND CORONARY ANGIOGRAPHY
Anesthesia: LOCAL

## 2014-10-23 MED ORDER — SODIUM CHLORIDE 0.9 % IJ SOLN: 3.0000 mL | Freq: Two times a day (BID) | INTRAMUSCULAR | Status: DC

## 2014-10-23 MED ORDER — BISACODYL 5 MG PO TBEC
10.0000 mg | DELAYED_RELEASE_TABLET | Freq: Once | ORAL | Status: AC
  Administered 2014-10-23: 10 mg via ORAL
  Filled 2014-10-23: qty 2

## 2014-10-23 MED ORDER — FENTANYL CITRATE (PF) 100 MCG/2ML IJ SOLN
INTRAMUSCULAR | Status: AC
  Filled 2014-10-23: qty 4

## 2014-10-23 MED ORDER — LIDOCAINE HCL (PF) 1 % IJ SOLN
INTRAMUSCULAR | Status: DC | PRN
  Administered 2014-10-23: 15:00:00

## 2014-10-23 MED ORDER — LIDOCAINE HCL (PF) 1 % IJ SOLN
INTRAMUSCULAR | Status: AC
  Filled 2014-10-23: qty 30

## 2014-10-23 MED ORDER — PEG 3350-KCL-NA BICARB-NACL 420 G PO SOLR
4000.0000 mL | Freq: Once | ORAL | Status: DC
  Filled 2014-10-23 (×2): qty 4000

## 2014-10-23 MED ORDER — SODIUM CHLORIDE 0.9 % IV SOLN: 250.0000 mL | INTRAVENOUS | Status: DC | PRN

## 2014-10-23 MED ORDER — SODIUM CHLORIDE 0.9 % IJ SOLN: 3.0000 mL | INTRAMUSCULAR | Status: DC | PRN

## 2014-10-23 MED ORDER — ASPIRIN 81 MG PO CHEW
81.0000 mg | CHEWABLE_TABLET | Freq: Every day | ORAL | Status: DC
  Administered 2014-10-24 – 2014-10-31 (×8): 81 mg via ORAL
  Filled 2014-10-23 (×9): qty 1

## 2014-10-23 MED ORDER — MORPHINE SULFATE (PF) 2 MG/ML IV SOLN: 1.0000 mg | INTRAVENOUS | Status: DC | PRN

## 2014-10-23 MED ORDER — ACETAMINOPHEN 325 MG PO TABS: 650.0000 mg | ORAL_TABLET | ORAL | Status: DC | PRN

## 2014-10-23 MED ORDER — HEPARIN (PORCINE) IN NACL 2-0.9 UNIT/ML-% IJ SOLN
INTRAMUSCULAR | Status: AC
  Filled 2014-10-23: qty 1000

## 2014-10-23 MED ORDER — SODIUM CHLORIDE 0.9 % WEIGHT BASED INFUSION: 1.0000 mL/kg/h | INTRAVENOUS | Status: DC

## 2014-10-23 MED ORDER — MIDAZOLAM HCL 2 MG/2ML IJ SOLN
INTRAMUSCULAR | Status: DC | PRN
  Administered 2014-10-23: 1 mg via INTRAVENOUS

## 2014-10-23 MED ORDER — SODIUM CHLORIDE 0.9 % WEIGHT BASED INFUSION
3.0000 mL/kg/h | INTRAVENOUS | Status: AC
  Administered 2014-10-23: 3 mL/kg/h via INTRAVENOUS

## 2014-10-23 MED ORDER — DEXTROSE 5 % IV SOLN
2.0000 g | Freq: Two times a day (BID) | INTRAVENOUS | Status: DC
  Administered 2014-10-23 – 2014-10-25 (×5): 2 g via INTRAVENOUS
  Filled 2014-10-23 (×6): qty 2

## 2014-10-23 MED ORDER — FENTANYL CITRATE (PF) 100 MCG/2ML IJ SOLN
INTRAMUSCULAR | Status: DC | PRN
  Administered 2014-10-23: 25 ug via INTRAVENOUS

## 2014-10-23 MED ORDER — ASPIRIN 81 MG PO CHEW
81.0000 mg | CHEWABLE_TABLET | ORAL | Status: AC
  Administered 2014-10-23: 81 mg via ORAL
  Filled 2014-10-23: qty 1

## 2014-10-23 MED ORDER — SODIUM CHLORIDE 0.9 % IJ SOLN
3.0000 mL | Freq: Two times a day (BID) | INTRAMUSCULAR | Status: DC
  Administered 2014-10-24: 17:00:00 3 mL via INTRAVENOUS

## 2014-10-23 MED ORDER — MIDAZOLAM HCL 2 MG/2ML IJ SOLN
INTRAMUSCULAR | Status: AC
  Filled 2014-10-23: qty 4

## 2014-10-23 MED ORDER — IOHEXOL 350 MG/ML SOLN
INTRAVENOUS | Status: DC | PRN
  Administered 2014-10-23: 35 mL via INTRAVENOUS

## 2014-10-23 MED ORDER — ONDANSETRON HCL 4 MG/2ML IJ SOLN: 4.0000 mg | Freq: Four times a day (QID) | INTRAMUSCULAR | Status: DC | PRN

## 2014-10-23 MED ORDER — SODIUM CHLORIDE 0.9 % WEIGHT BASED INFUSION: 3.0000 mL/kg/h | INTRAVENOUS | Status: DC

## 2014-10-23 SURGICAL SUPPLY — 10 items
CATH INFINITI 5FR MULTPACK ANG (CATHETERS) ×1 IMPLANT
CATH SWAN GANZ 7F STRAIGHT (CATHETERS) ×1 IMPLANT
GLIDESHEATH SLEND A-KIT 6F 22G (SHEATH) ×2 IMPLANT
KIT HEART LEFT (KITS) ×2 IMPLANT
KIT HEART RIGHT NAMIC (KITS) ×2 IMPLANT
PACK CARDIAC CATHETERIZATION (CUSTOM PROCEDURE TRAY) ×2 IMPLANT
SHEATH PINNACLE 5F 10CM (SHEATH) ×1 IMPLANT
SHEATH PINNACLE 7F 10CM (SHEATH) ×1 IMPLANT
TRANSDUCER W/STOPCOCK (MISCELLANEOUS) ×4 IMPLANT
WIRE EMERALD 3MM-J .035X150CM (WIRE) ×1 IMPLANT

## 2014-10-23 NOTE — Progress Notes (Signed)
Patient ID: Alan [name], male   DOB: [birth year], 64 y.o.   MRN: [id number] [facility] PROGRESS NOTE  Alan [name] KDX:[id number] DOB: October 15, 1950 DOA: 10/17/2014 PCP: [name], MD  Brief narrative:    64 y.o. male with past medical history of prostate cancer, RLS who presented to [facility] with worsening symptoms of dizziness and gait instability. He had some mental status changes and was lethargic prior to admission for which reason his wife brought him in to ED. Pt has been working in a yard a lot and per wife she was concerned that he may have had a tick bit.  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 Patient has been seen by ID and cardiology in consultation. He is showing an improvement although slow on therapy with Omnipen and Rocephin.  Barrier to discharge: Plan for cardiac cath today which pt needs in anticipation of valve surgery. He is also scheduled to go for colonoscopy but did not have prep yesterday so colonoscopy will be done 8/30.  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 Urine culture also grew enterococcus species. Repeat blood cultures on 10/20/2014 shows no growth to date. - MRI brain on 10/20/2014 showed scattered small acute to subacute and occasional early chronic, lacunar infarcts in both MCA territories and the left cerebellum. consider sequelae of endocarditis and septic emboli in this setting.  - Pt is status post TEE which demonstrated mild MR with vegetation on atrial surface of anterior mitral leaflet as well as severe AR with large mobile vegetation on the non coronary cusp. ID was consulted and pt initially on vanco, gentamycin but currently on Omnipen and rocephin. So far tolerating this therapy well  with no signs of adverse reaction.  - Plan is for cardiac cath today in anticipation of valve surgery. - Continue to monitor in SDU due to acuity of medical issues at present.  - Of note, studies such as UDS, HIV Ab, RPR all normal  Active Problems: Paroxysmal atrial fibrillation /Atrial flutter - CHADS vasc score at least 3 (Age, history of CVA) - So far rate relatively controlled - Appreciate cardiology following   Troponin elevation - Likely demand ischemia from acute septic emboli and bacteremia - Plan for cardiac cath today - Continue daily aspirin  History of  CVA  - As evidenced on MRI brain, pt has chronic anterior division left MCA infarct and a small number of chronic micro hemorrhages are seen in both hemispheres in the left cerebellum - Continue daily aspirin - Appreciate physical therapy evaluation   Diarrhea - GI pathogen panel negative  Restless leg syndrome - Continue Pramipexole  Normocytic anemia - Hemoglobin stable at 10 - No current indications for transfusion - Plan for colonoscopy tomorrow   Dizziness - Likely combination of previous stroke and septic emboli - Appreciate PT evaluation   Splenic lesion - Questionable splenic infarct seen on abd [place] but need MRI for better evaluation. Order placed for MRI.    DVT Prophylaxis  - SCD's bilaterally    Code Status: Full.  Family Communication:  plan of care discussed with the patient Disposition Plan: remains in SDU due to acute medical issues including   IV access:  Peripheral IV  Procedures and diagnostic studies:    Dg Orthopantogram 10/21/2014 1. No fracture or bone lesion. No evidence of a  dental abscess or untreated carie.   Electronically Signed   By: Lajean Manes M.D.   On: 10/21/2014 10:09   [name] Head Wo Contrast 10/20/2014    1. Scattered small acute to subacute, and occasional early chronic, lacunar infarcts in both MCA territories and the left cerebellum. None of these appear  hemorrhagic (cerebral septic emboli are prone to bleed), but consider sequelae of endocarditis and septic emboli in this setting. No significant edema or mass effect. 2. Chronic anterior division left MCA infarct. A small number of chronic micro hemorrhages are identified in both hemispheres an the left cerebellum. 3. Mild for age intracranial atherosclerosis with no stenosis or branch occlusion identified. Mild intracranial artery dolichoectasia.   Electronically Signed   By: Genevie Ann M.D.   On: 10/20/2014 09:49   Mr [name] Contrast 10/20/2014  Scattered small acute to subacute, and occasional early chronic, lacunar infarcts in both MCA territories and the left cerebellum. None of these appear hemorrhagic (cerebral septic emboli are prone to bleed), but consider sequelae of endocarditis and septic emboli in this setting. No significant edema or mass effect. 2. Chronic anterior division left MCA infarct. A small number of chronic micro hemorrhages are identified in both hemispheres an the left cerebellum. 3. Mild for age intracranial atherosclerosis with no stenosis or branch occlusion identified. Mild intracranial artery dolichoectasia.   Electronically Signed   By: Genevie Ann M.D.   On: 10/20/2014 09:49   US Abdomen Complete 10/23/2014   Large irregular hypoechoic lesion within the spleen which is incompletely characterized on ultrasound. While splenic infarct is not excluded, this is favored to represent a mass lesion. Given the size of the lesion and no priors for comparison, when patient clinically able, recommend evaluation with MRI.  Mild splenomegaly.  Increased renal cortical echogenicity as can be seen with chronic medical renal disease.  Bilateral pleural effusions.  These results will be called to the ordering clinician or representative by the Radiologist Assistant, and communication documented in the PACS or zVision Dashboard.   Electronically Signed   By: Lovey Newcomer M.D.   On: 10/23/2014 11:25   Dg  Chest 2 View 10/18/2014 No active cardiopulmonary disease.   Electronically Signed   By: Lucienne Capers M.D.   On: 10/18/2014 00:49   Cardiac catheterization 10/23/2014   Medical Consultants:  Cardiology [facility] (Dr. Arta Silence) Infectious disease  Other Consultants:  Physical therapy  IAnti-Infectives:         Leisa Lenz, MD  [facility] Pager [phone]  Time spent in minutes: 25 minutes  If 7PM-7AM, please contact night-coverage [email or web address] Password [facility] 10/23/2014, 8:51 PM   LOS: 5 days    HPI/Subjective: No acute overnight events. Patient reports no chest pain, no shortness of breath.   Objective: Filed Vitals:   10/23/14 1730 10/23/14 1800 10/23/14 1900 10/23/14 1940  BP: 119/63 117/62 108/64 102/56  Pulse: 84 86 90 86  Temp:    98.9 F (37.2 C)  TempSrc:    Oral  Resp: 19 20 22 23   Height:      Weight:      SpO2: 93% 93% 92% 92%    Intake/Output Summary (Last 24 hours) at 10/23/14 2051 Last data filed at 10/23/14 1930  Gross per 24 hour  Intake 2101.1 ml  Output   1200 ml  Net  901.1 ml    Exam:   General:  Pt is alert, follows commands appropriately, not in acute distress  Cardiovascular:  tachycardic, S1/S2 appreciated   Respiratory: Clear to auscultation bilaterally, no wheezing, no crackles, no rhonchi  Abdomen: Soft, non tender, non distended, bowel sounds present  Extremities: No edema, pulses DP and PT palpable bilaterally  Neuro: Grossly nonfocal  Data Reviewed: Basic Metabolic Panel:  Recent Labs Lab 10/18/14 0125 10/18/14 1008 10/19/14 0310 10/21/14 0236 10/21/14 2048 10/23/14 0130  NA 129*  --  134* 134* 133* 131*  K 4.0  --  3.7 4.3 4.3 4.6  CL 100*  --  106 103 100* 98*  CO2 21*  --  22 26 28 28   GLUCOSE 118*  --  122* 109* 119* 125*  BUN 12  --  10 5* 6 <5*  CREATININE 1.20  --  0.94 0.94 1.08 1.00  CALCIUM 8.3*  --  8.4* 8.4* 8.5* 8.6*  MG  --  1.7  --   --  1.7  --   PHOS  --   --   --   --   3.4  --    Liver Function Tests:  Recent Labs Lab 10/18/14 0125 10/21/14 2048  AST 22 49*  ALT 18 28  ALKPHOS 40 85  BILITOT 0.8 0.5  PROT 6.4* 5.7*  ALBUMIN 2.4* 1.9*    Recent Labs Lab 10/18/14 0125  LIPASE 72*   No results for input(s): AMMONIA in the last 168 hours. CBC:  Recent Labs Lab 10/18/14 0125 10/19/14 0310 10/21/14 0236 10/21/14 2048 10/23/14 0130  WBC 8.9 6.3 11.6* 14.3* 15.3*  NEUTROABS 7.0  --   --  10.7*  --   HGB 9.2* 8.8* 10.0* 10.3* 10.0*  HCT 28.4* 27.2* 30.9* 31.0* 30.5*  MCV 84.3 84.2 83.1 84.0 84.5  PLT 231 212 302 294 324   Cardiac Enzymes:  Recent Labs Lab 10/22/14 1510 10/22/14 1955 10/23/14 0130 10/23/14 0730 10/23/14 1330  TROPONINI 0.16* 0.19* 0.15* 0.11* 0.12*   BNP: Invalid input(s): POCBNP CBG:  Recent Labs Lab 10/19/14 0811 10/20/14 0810 10/21/14 0750 10/22/14 0806 10/23/14 0823  GLUCAP 109* 99 100* 108* 108*    Blood Culture (routine x 2)     Status: None   Collection Time: 10/18/14  1:11 AM  Result Value Ref Range Status   Specimen Description BLOOD RIGHT ANTECUBITAL  Final   Report Status 10/20/2014 FINAL  Final   Organism ID, Bacteria ENTEROCOCCUS SPECIES  Final      Susceptibility   Enterococcus species - MIC*    AMPICILLIN <=2 SENSITIVE Sensitive     VANCOMYCIN 1 SENSITIVE Sensitive     GENTAMICIN SYNERGY SENSITIVE Sensitive     LINEZOLID 2 SENSITIVE Sensitive     * ENTEROCOCCUS SPECIES  Blood Culture (routine x 2)     Status: None   Collection Time: 10/18/14  1:19 AM  Result Value Ref Range Status   Specimen Description BLOOD RIGHT WRIST  Final    ENTEROCOCCUS SPECIES    Report Status 10/20/2014 FINAL  Final  Urine culture     Status: None   Collection Time: 10/18/14  2:56 AM  Result Value Ref Range Status   Specimen Description URINE, CATHETERIZED  Final   Special Requests NONE  Final   Report Status 10/20/2014 FINAL  Final   Organism ID, Bacteria ENTEROCOCCUS FAECALIS  Final       Susceptibility   Enterococcus faecalis - MIC*    AMPICILLIN <=2 SENSITIVE Sensitive     LEVOFLOXACIN 1 SENSITIVE Sensitive     NITROFURANTOIN <=16 SENSITIVE Sensitive  VANCOMYCIN 1 SENSITIVE Sensitive     LINEZOLID 2 SENSITIVE Sensitive     * 20,000 COLONIES/mL ENTEROCOCCUS FAECALIS  MRSA PCR Screening     Status: None   Collection Time: 10/18/14  5:58 PM  Result Value Ref Range Status   MRSA by PCR NEGATIVE NEGATIVE Final  Culture, blood (routine x 2)     Status: None (Preliminary result)   Collection Time: 10/20/14 11:10 AM  Result Value Ref Range Status   Specimen Description BLOOD RIGHT ARM  Final   Special Requests BOTTLES DRAWN AEROBIC AND ANAEROBIC 5CC  Final   Culture NO GROWTH 3 DAYS  Final   Report Status PENDING  Incomplete  Culture, blood (routine x 2)     Status: None (Preliminary result)   Collection Time: 10/20/14 11:26 AM  Result Value Ref Range Status   Specimen Description BLOOD LEFT HAND  Final   Special Requests BOTTLES DRAWN AEROBIC AND ANAEROBIC 5CC  Final   Culture NO GROWTH 3 DAYS  Final   Report Status PENDING  Incomplete     Scheduled Meds: . ampicillin (OMNIPEN) IV  2 g Intravenous Q4H  . [START ON 10/24/2014] aspirin  81 mg Oral Daily  . bisacodyl  10 mg Oral Once  . cefTRIAXone (ROCEPHIN)  IV  2 g Intravenous Q12H  . polyethylene glycol-electrolytes  4,000 mL Oral Once  . pramipexole  1.5 mg Oral QHS  . sodium chloride  3 mL Intravenous Q12H   Continuous Infusions: . amiodarone 30 mg/hr (10/23/14 0401)

## 2014-10-23 NOTE — Interval H&P Note (Signed)
Cath Lab Visit (complete for each Cath Lab visit)  Clinical Evaluation Leading to the Procedure:   ACS: No.  Non-ACS:    Anginal Classification: No Symptoms  Anti-ischemic medical therapy: No Therapy  Non-Invasive Test Results: No non-invasive testing performed  Prior CABG: No previous CABG      History and Physical Interval Note:  10/23/2014 2:01 PM  Alan [name]  has presented today for surgery, with the diagnosis of chf  The various methods of treatment have been discussed with the patient and family. After consideration of risks, benefits and other options for treatment, the patient has consented to  Procedure(s): Right/Left Heart Cath and Coronary Angiography (N/A) as a surgical intervention .  The patient's history has been reviewed, patient examined, no change in status, stable for surgery.  I have reviewed the patient's chart and labs.  Questions were answered to the patient's satisfaction.     Quay Burow

## 2014-10-23 NOTE — Consult Note (Signed)
[facility] for Infectious Disease    Date of Admission:  10/17/2014   Total days of antibiotics 5        Day 2 ampicillin        Day 2 ceftriaxone         Principal Problem:   Enterococcal bacteremia Active Problems:   Aortic valve endocarditis - Enterococcus   Endocarditis of mitral valve - Enterococcus   Restless leg syndrome   Prostate cancer   Acute encephalopathy   Rash   Sepsis   Normocytic anemia   Dizziness   Acute respiratory failure   Paroxysmal atrial fibrillation   Paroxysmal atrial flutter   Atherosclerotic peripheral vascular disease   Aortic valve insufficiency, severe   Mitral valve regurgitation, infectious   . ampicillin (OMNIPEN) IV  2 g Intravenous Q4H  . [START ON 10/24/2014] aspirin  81 mg Oral Pre-Cath  . cefTRIAXone (ROCEPHIN)  IV  2 g Intravenous Q12H  . chlorhexidine  15 mL Mouth/Throat BID  . heparin  5,000 Units Subcutaneous 3 times per day  . pramipexole  1.5 mg Oral QHS  . sodium chloride  3 mL Intravenous Q12H  . sodium chloride  3 mL Intravenous Q12H    SUBJECTIVE: He states that he is feeling better. He feels that his memory and concentration are back to about 85% of his normal baseline. His wife agrees that he is making progress.  Review of Systems: Pertinent items are noted in HPI.  Past Medical History  Diagnosis Date  . Restless leg syndrome   . Prostate cancer   . 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 Paroxysmal atrial fibrillation 10/19/2014  . Paroxysmal atrial flutter 10/19/2014  . Enterococcal bacteremia 10/19/2014    Social History  Substance Use Topics  . Smoking status: Former Research scientist (life sciences)  . Smokeless tobacco: None     Comment: Smoked a pipe age 41->28  . Alcohol Use: No     Comment: Drank from age 79->38    Family  History  Problem Relation Age of Onset  . CAD Neg Hx    Allergies  Allergen Reactions  . Penicillins Swelling    Unknown reaction    OBJECTIVE: Filed Vitals:   10/22/14 1946 10/23/14 0008 10/23/14 0400 10/23/14 0836  BP: 109/63 119/74 104/70 109/82  Pulse: 111 124 140 95  Temp: 100 F (37.8 C) 99 F (37.2 C) 97.8 F (36.6 C) 98 F (36.7 C)  TempSrc: Oral Oral Oral Oral  Resp: 23 30 19 19   Height:      Weight:      SpO2: 90% 94% 90% 90%   Body mass index is 22.47 kg/(m^2).  General: asleep but he arouses easily Skin: no rash Lungs: clear Cor: regular S1 and S2 with no murmur  Lab Results Lab Results  Component Value Date   WBC 15.3* 10/23/2014   HGB 10.0* 10/23/2014   HCT 30.5* 10/23/2014   MCV 84.5 10/23/2014   PLT 324 10/23/2014    Lab Results  Component Value Date   CREATININE 1.00 10/23/2014   BUN <5* 10/23/2014   NA 131* 10/23/2014   K 4.6 10/23/2014   CL 98* 10/23/2014   CO2 28 10/23/2014    Lab Results  Component Value Date   ALT 28 10/21/2014   AST 49* 10/21/2014  ALKPHOS 85 10/21/2014   BILITOT 0.5 10/21/2014     Microbiology: Recent Results (from the past 240 hour(s))  Blood Culture (routine x 2)     Status: None   Collection Time: 10/18/14  1:11 AM  Result Value Ref Range Status   Specimen Description BLOOD RIGHT ANTECUBITAL  Final   Special Requests BOTTLES DRAWN AEROBIC AND ANAEROBIC 10CC  Final   Culture  Setup Time   Final    GRAM POSITIVE COCCI IN PAIRS IN BOTH AEROBIC AND ANAEROBIC BOTTLES CRITICAL RESULT CALLED TO, READ BACK BY AND VERIFIED WITH: P CARBORNE 10/18/14 @ 63 M VESTAL    Culture ENTEROCOCCUS SPECIES  Final   Report Status 10/20/2014 FINAL  Final   Organism ID, Bacteria ENTEROCOCCUS SPECIES  Final      Susceptibility   Enterococcus species - MIC*    AMPICILLIN <=2 SENSITIVE Sensitive     VANCOMYCIN 1 SENSITIVE Sensitive     GENTAMICIN SYNERGY SENSITIVE Sensitive     LINEZOLID 2 SENSITIVE Sensitive     *  ENTEROCOCCUS SPECIES  Blood Culture (routine x 2)     Status: None   Collection Time: 10/18/14  1:19 AM  Result Value Ref Range Status   Specimen Description BLOOD RIGHT WRIST  Final   Special Requests BOTTLES DRAWN AEROBIC AND ANAEROBIC 10CC  Final   Culture  Setup Time   Final    GRAM POSITIVE COCCI IN PAIRS IN BOTH AEROBIC AND ANAEROBIC BOTTLES CRITICAL RESULT CALLED TO, READ BACK BY AND VERIFIED WITH: P CARBORNE 10/18/14 @ 30 M VESTAL    Culture   Final    ENTEROCOCCUS SPECIES SUSCEPTIBILITIES PERFORMED ON PREVIOUS CULTURE WITHIN THE LAST 5 DAYS.    Report Status 10/20/2014 FINAL  Final  Urine culture     Status: None   Collection Time: 10/18/14  2:56 AM  Result Value Ref Range Status   Specimen Description URINE, CATHETERIZED  Final   Special Requests NONE  Final   Culture 20,000 COLONIES/mL ENTEROCOCCUS FAECALIS  Final   Report Status 10/20/2014 FINAL  Final   Organism ID, Bacteria ENTEROCOCCUS FAECALIS  Final      Susceptibility   Enterococcus faecalis - MIC*    AMPICILLIN <=2 SENSITIVE Sensitive     LEVOFLOXACIN 1 SENSITIVE Sensitive     NITROFURANTOIN <=16 SENSITIVE Sensitive     VANCOMYCIN 1 SENSITIVE Sensitive     LINEZOLID 2 SENSITIVE Sensitive     * 20,000 COLONIES/mL ENTEROCOCCUS FAECALIS  MRSA PCR Screening     Status: None   Collection Time: 10/18/14  5:58 PM  Result Value Ref Range Status   MRSA by PCR NEGATIVE NEGATIVE Final    Comment:        The GeneXpert MRSA Assay (FDA approved for NASAL specimens only), is one component of a comprehensive MRSA colonization surveillance program. It is not intended to diagnose MRSA infection nor to guide or monitor treatment for MRSA infections.   Culture, blood (routine x 2)     Status: None (Preliminary result)   Collection Time: 10/20/14 11:10 AM  Result Value Ref Range Status   Specimen Description BLOOD RIGHT ARM  Final   Special Requests BOTTLES DRAWN AEROBIC AND ANAEROBIC 5CC  Final   Culture NO GROWTH  2 DAYS  Final   Report Status PENDING  Incomplete  Culture, blood (routine x 2)     Status: None (Preliminary result)   Collection Time: 10/20/14 11:26 AM  Result Value Ref Range Status  Specimen Description BLOOD LEFT HAND  Final   Special Requests BOTTLES DRAWN AEROBIC AND ANAEROBIC 5CC  Final   Culture NO GROWTH 2 DAYS  Final   Report Status PENDING  Incomplete     ASSESSMENT: He is showing slow improvement on therapy for severe, 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 He is tolerating double beta-lactam therapy well without any signs of allergy or adverse reaction.  PLAN: 1. Continue ampicillin and ceftriaxone  Michel Bickers, MD [facility] [place] [phone] pager   [phone] cell 10/23/2014, 12:16 PM

## 2014-10-23 NOTE — Progress Notes (Signed)
To Cath lab 

## 2014-10-23 NOTE — Progress Notes (Signed)
Site area: rt groin fa and fv sheaths Site Prior to Removal:  Level 0 Pressure Applied For:  25 minutes Manual:   yes Patient Status During Pull:  stable Post Pull Site:  Level  0 Post Pull Instructions Given:  yes Post Pull Pulses Present: yes Dressing Applied:  tegaderm Bedrest begins @  [id number] Comments:  0

## 2014-10-23 NOTE — Anesthesia Preprocedure Evaluation (Addendum)
Anesthesia Evaluation  Patient identified by MRN, date of birth, ID band Patient awake    Reviewed: Allergy & Precautions, H&P , NPO status , Patient's Chart, lab work & pertinent test results  Airway Mallampati: II  TM Distance: >3 FB Neck ROM: Full    Dental no notable dental hx. (+) Teeth Intact, Dental Advisory Given   Pulmonary neg pulmonary ROS, former smoker,  breath sounds clear to auscultation  Pulmonary exam normal       Cardiovascular + dysrhythmias Atrial Fibrillation + Valvular Problems/Murmurs AI and MR Rhythm:Regular Rate:Normal + Systolic murmurs Aortic valve endocarditis - Enterococcus  Left ventricle: Systolic function was vigorous. The estimated ejection fraction was in the range of 65% to 70%. - Aortic valve: Mass attached to ventricular side of noncoronary cusp Measures 11 x 8 mm There appeas to be aonther mass associated with R coronary cusp (not well imaged). Moderate aortic insufficiency. There was mild regurgitation. - Mitral valve: Mass attached to atrial side of anterior mitral leaflet 10 x 6 mm. Consistent with vegetation. Mild MR. - Left atrium: The atrium was mildly dilated. - Right atrium: The atrium was mildly dilated.    Neuro/Psych CVA negative psych ROS   GI/Hepatic negative GI ROS, Neg liver ROS,   Endo/Other  negative endocrine ROS  Renal/GU negative Renal ROS  negative genitourinary   Musculoskeletal negative musculoskeletal ROS (+)   Abdominal   Peds negative pediatric ROS (+)  Hematology negative hematology ROS (+)   Anesthesia Other Findings   Reproductive/Obstetrics negative OB ROS                         Anesthesia Physical Anesthesia Plan  ASA: III  Anesthesia Plan: MAC   Post-op Pain Management:    Induction: Intravenous  Airway Management Planned: Simple Face Mask  Additional Equipment:   Intra-op Plan:   Post-operative  Plan:   Informed Consent: I have reviewed the patients History and Physical, chart, labs and discussed the procedure including the risks, benefits and alternatives for the proposed anesthesia with the patient or authorized representative who has indicated his/her understanding and acceptance.   Dental advisory given  Plan Discussed with: CRNA and Surgeon  Anesthesia Plan Comments:         Anesthesia Quick Evaluation

## 2014-10-23 NOTE — Progress Notes (Signed)
RN, Helene Kelp, paged this NP because pt's BP is soft. Is on Amiodarone for Afib/flutter. Also, is getting prep for colonoscopy. Pt is in NSR now with HR in 80s.  Will give 500cc bolus NS now for soft BP likely being exacerbated by colon prep.  Given NSR, will stop Amiodarone. This could be contributing to soft BP as well. Plan discussed with Helene Kelp, RN. She is to call back should manual BP still be soft after bolus and/or irregular rhythm with rate uncontrolled.  Clance Boll, NP [facility]

## 2014-10-23 NOTE — Progress Notes (Addendum)
Physical Therapy Treatment Patient Details Name: Alan [name] MRN: [id number] DOB: 1950-07-11 Today's Date: 10/23/2014    History of Present Illness 64 y.o. male with PMH of prostate cancer, RLS, who resents result in mental status, dizziness and rashes. Patient has been having dizziness and poor balance in the past 2-3 weeks. No unilateral weakness, vision change or hearing loss. Patient recently did a lot of yard work in [place], such as cutting trees. He could have had insect bite per his wife. Patient became confused and lethargic, and was found asleep by security guard at work (works at [employer]) at about 9:30 PM. He was also found to have fever with temperature 101.2 and petechial rashes over bilateral legs. Currently presents with acute encephalopathy.    PT Comments    Patient in bed, eager to participate in PT today. Patient was able to ambulate and transfer as described below. Patient was significantly weaker than when previously seen on Friday, which patient agreed with. His gait speed and stability were significantly decreased. He was educated on exercises to perform in the chair and in the bed to maintain his strength, as well as told to be sitting up for all meals in the chair with the help of nursing staff for management of lines and leads. He was agreeable to these measures. Session was shortened due to [place] procedure. Patient will benefit from continued PT to maintain his strength and return him to baseline function.  Follow Up Recommendations  Supervision for mobility/OOB;Outpatient PT (Outpt PT for balance and for UE strengthening and ROM)     Equipment Recommendations  None recommended by PT    Recommendations for Other Services OT consult     Precautions / Restrictions Precautions Precautions: Fall Restrictions Weight Bearing Restrictions: No    Mobility  Bed Mobility Overal bed mobility: Needs Assistance Bed Mobility: Supine to Sit;Sit to  Supine     Supine to sit: Min assist;HOB elevated Sit to supine: Min assist   General bed mobility comments: Min A for trunk elevation to sitting, min A for LE management into supine. Patient is significantly weaker than when previously seen.  Transfers Overall transfer level: Modified independent Equipment used: None             General transfer comment: Patient able to stand without physical assistance but with increased time and use of bed rails/bathroom grab bars.  Ambulation/Gait Ambulation/Gait assistance: Min guard Ambulation Distance (Feet): 100 Feet Assistive device: None Gait Pattern/deviations: Decreased stride length;Shuffle;Narrow base of support   Gait velocity interpretation: <1.8 ft/sec, indicative of risk for recurrent falls General Gait Details: Patient reports feeling much weaker, not able to walk as quickly, comfortably or as far as when last seen by PT.    Stairs            Wheelchair Mobility    Modified Rankin (Stroke Patients Only)       Balance Overall balance assessment: Needs assistance Sitting-balance support: No upper extremity supported;Feet supported Sitting balance-Leahy Scale: Good     Standing balance support: No upper extremity supported Standing balance-Leahy Scale: Good Standing balance comment: Patient can stand without physical assistance but feels much weaker than previously.                    Cognition Arousal/Alertness: Awake/alert Behavior During Therapy: WFL for tasks assessed/performed Overall Cognitive Status: Within Functional Limits for tasks assessed  Exercises      General Comments        Pertinent Vitals/Pain Pain Assessment: Faces Faces Pain Scale: Hurts little more Pain Location: Bil shoulders especially from flexion to extension Pain Descriptors / Indicators: Aching;Sharp Pain Intervention(s): Monitored during session;Repositioned  HR remained 106-108 bpm  during ambulation. SpO2 > 93%.    Home Living Family/patient expects to be discharged to:: Private residence Living Arrangements: Spouse/significant other Available Help at Discharge: Family;Available PRN/intermittently (wife says she can take off as needed) Type of Home: House Home Access: Level entry   Home Layout: One level Home Equipment:  (can borrow a 3n1 and rollator if needed)      Prior Function Level of Independence: Independent          PT Goals (current goals can now be found in the care plan section) Acute Rehab PT Goals Patient Stated Goal: Return to work. PT Goal Formulation: With patient Time For Goal Achievement: 11/03/14 Potential to Achieve Goals: Good Progress towards PT goals: Progressing toward goals    Frequency  Min 3X/week    PT Plan Discharge plan needs to be updated    Co-evaluation             End of Session Equipment Utilized During Treatment: Gait belt Activity Tolerance: Patient limited by fatigue Patient left: in bed;with call bell/phone within reach;with nursing/sitter in room (Transporting to [place].)     Time: 4081-4481 PT Time Calculation (min) (ACUTE ONLY): 19 min  Charges:  $Gait Training: 8-22 mins                    G CodesRoanna Epley, SPT [phone] 10/23/2014, 1:58 PM  I have read, reviewed and agree with student's note.   [place] [phone] (pager)

## 2014-10-23 NOTE — Progress Notes (Signed)
UR COMPLETED  

## 2014-10-23 NOTE — Progress Notes (Signed)
Pt scheduled for colonoscopy this morning. Never received order for prep. Text paged and called [facility] and [facility]. No orders were given. Pt has been NPO since 0000 per order. Will pass on to AM RN   Hart Rochester, RN, BSN

## 2014-10-23 NOTE — Progress Notes (Signed)
  SUBJECTIVE:  He is not having SOB or chest pain but he does have back pain   PHYSICAL EXAM Filed Vitals:   10/22/14 1639 10/22/14 1946 10/23/14 0008 10/23/14 0400  BP: 130/77 109/63 119/74 104/70  Pulse: 129 111 124 140  Temp:  100 F (37.8 C) 99 F (37.2 C) 97.8 F (36.6 C)  TempSrc:  Oral Oral Oral  Resp: 29 23 30 19  Height:      Weight:      SpO2: 94% 90% 94% 90%   General:  Looks weak but not acute distress Lungs:  Decreased breath sounds Heart:  RRR Abdomen:  Positive bowel sounds, no rebound no guarding Extremities:  No edema Neuro:  Halting speech.  Otherwise nonfocal.  LABS: Lab Results  Component Value Date   TROPONINI 0.15* 10/23/2014   Results for orders placed or performed during the hospital encounter of 10/17/14 (from the past 24 hour(s))  Gentamicin level, peak     Status: Abnormal   Collection Time: 10/22/14  9:04 AM  Result Value Ref Range   Gentamicin Pk 2.3 (L) 5.0 - 10.0 ug/mL  Troponin I     Status: Abnormal   Collection Time: 10/22/14  9:04 AM  Result Value Ref Range   Troponin I 0.19 (H) <0.031 ng/mL  Troponin I     Status: Abnormal   Collection Time: 10/22/14  3:10 PM  Result Value Ref Range   Troponin I 0.16 (H) <0.031 ng/mL  Troponin I     Status: Abnormal   Collection Time: 10/22/14  7:55 PM  Result Value Ref Range   Troponin I 0.19 (H) <0.031 ng/mL  CBC     Status: Abnormal   Collection Time: 10/23/14  1:30 AM  Result Value Ref Range   WBC 15.3 (H) 4.0 - 10.5 K/uL   RBC 3.61 (L) 4.22 - 5.81 MIL/uL   Hemoglobin 10.0 (L) 13.0 - 17.0 g/dL   HCT 30.5 (L) 39.0 - 52.0 %   MCV 84.5 78.0 - 100.0 fL   MCH 27.7 26.0 - 34.0 pg   MCHC 32.8 30.0 - 36.0 g/dL   RDW 15.4 11.5 - 15.5 %   Platelets 324 150 - 400 K/uL  Basic metabolic panel     Status: Abnormal   Collection Time: 10/23/14  1:30 AM  Result Value Ref Range   Sodium 131 (L) 135 - 145 mmol/L   Potassium 4.6 3.5 - 5.1 mmol/L   Chloride 98 (L) 101 - 111 mmol/L   CO2 28 22 -  32 mmol/L   Glucose, Bld 125 (H) 65 - 99 mg/dL   BUN <5 (L) 6 - 20 mg/dL   Creatinine, Ser 1.00 0.61 - 1.24 mg/dL   Calcium 8.6 (L) 8.9 - 10.3 mg/dL   GFR calc non Af Amer >60 >60 mL/min   GFR calc Af Amer >60 >60 mL/min   Anion gap 5 5 - 15  Troponin I     Status: Abnormal   Collection Time: 10/23/14  1:30 AM  Result Value Ref Range   Troponin I 0.15 (H) <0.031 ng/mL    Intake/Output Summary (Last 24 hours) at 10/23/14 0827 Last data filed at 10/23/14 0600  Gross per 24 hour  Intake   2453 ml  Output   2600 ml  Net   -147 ml     ASSESSMENT AND PLAN:  PAF/FLUTTER:    In an out of atrial fib flutter.  Rate OK.    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  He is to get GI workup with colonoscopy tomorrow.  In anticipation of valve surgery he will need a cardiac cath.  I will put him on the board for later today since the colonoscopy has been delayed until tomorrow.   The patient understands that risks included but are not limited to stroke (1 in 1000), death (1 in 1000), kidney failure [usually temporary] (1 in 500), bleeding (1 in 200), allergic reaction [possibly serious] (1 in 200).  The patient understands and agrees to proceed.   I discussed today the possible timing of surgery with Dr. Owen.  It might be Friday or early next week.    MILDLY ELEVATED TROPONIN:  As above.   Alan [name] 10/23/2014 8:27 AM   

## 2014-10-23 NOTE — Evaluation (Signed)
Occupational Therapy Evaluation Patient Details Name: Alan [name] MRN: [id number] DOB: 07/05/1950 Today's Date: 10/23/2014    History of Present Illness 64 y.o. male with PMH of prostate cancer, RLS, who resents result in mental status, dizziness and rashes. Patient has been having dizziness and poor balance in the past 2-3 weeks. No unilateral weakness, vision change or hearing loss. Patient recently did a lot of yard work in [place], such as cutting trees. He could have had insect bite per his wife. Patient became confused and lethargic, and was found asleep by security guard at work (works at [employer]) at about 9:30 PM. He was also found to have fever with temperature 101.2 and petechial rashes over bilateral legs. Currently presents with acute encephalopathy.   Clinical Impression   This 64 yo male admitted with above presents to acute OT with decreased balance, decreased mobility, decreased used of BIL UEs, increased pain Bil UEs all affecting his ability to care for himself and work at an independent level as he was pta. He will benefit from acute OT with follow up OPPT to continue work with his shoulders.    Follow Up Recommendations   (OPPT for shoulders)    Equipment Recommendations  None recommended by OT       Precautions / Restrictions Precautions Precautions: Fall Restrictions Weight Bearing Restrictions: No      Mobility Bed Mobility Overal bed mobility: Needs Assistance Bed Mobility: Rolling;Sidelying to Sit Rolling: Min assist Sidelying to sit: Min assist (to bring trunk up off of bed)    Transfers Overall transfer level: Needs assistance Equipment used: None Transfers: Sit to/from [facility] Sit to Stand: Min assist Stand pivot transfers: Min assist          Balance Overall balance assessment: Needs assistance Sitting-balance support: No upper extremity supported;Feet supported Sitting balance-Leahy Scale:  Good     Standing balance support: No upper extremity supported Standing balance-Leahy Scale: Good Standing balance comment: Patient can stand without physical assistance but feels much weaker than previously.                            ADL Overall ADL's : Needs assistance/impaired   Eating/Feeding Details (indicate cue type and reason): Pt reports it is harder for him to get to his food the further away it is due to decreased shoulder flexion--encouraged him start off his meal with having items a normal distance from him and then as he progresses with his meal he can bring the items closer to him' to make it easier Grooming: Maximal assistance;Sitting   Upper Body Bathing: Maximal assistance;Sitting   Lower Body Bathing: Minimal assistance;Sit to/from stand   Upper Body Dressing : Maximal assistance;Sitting   Lower Body Dressing: Minimal assistance;Sit to/from stand   Toilet Transfer: Minimal assistance   Toileting- Clothing Manipulation and Hygiene: Maximal assistance (with min A sit<>stand)         General ADL Comments: All activities take increased time and effort due to decreased use of Bil UEs at shoudlers and elbows. Advised pt to use walk in shower with built in seat at least at first and that he may need to use 3n1 that he can borrow from other family     Vision Additional Comments: No change from baseline          Pertinent Vitals/Pain Pain Assessment: Faces Faces Pain Scale: Hurts little more Pain Location: Bil shoulders especially from flexion  to extension Pain Descriptors / Indicators: Aching;Sharp Pain Intervention(s): Monitored during session;Repositioned     Hand Dominance Right   Extremity/Trunk Assessment Upper Extremity Assessment Upper Extremity Assessment: RUE deficits/detail;LUE deficits/detail RUE Deficits / Details: Severe weakness in R shoulder and pain when going from AAROM flexion to extension--slight traction applied helped.  Slow active elbow flexion/extension. Forearms and hands WNL RUE Coordination: decreased gross motor LUE Deficits / Details: Severe weakness in R shoulder and pain when going from AAROM flexion to extension--slight traction applied helped. Slow active elbow flexion/extension. Forearms and hands WNL LUE Coordination: decreased gross motor           Communication Communication Communication: No difficulties   Cognition Arousal/Alertness: Awake/alert Behavior During Therapy: WFL for tasks assessed/performed Overall Cognitive Status: Within Functional Limits for tasks assessed                        Exercises Exercises: Other Exercises Other Exercises: Encourage pt to work on shoulder flexion in bed supine and for family present (wife and daughter) to A him with shoulder flexion in sitting. Pt also to do elbow flexion/extension in supine and/or sitting        Home Living Family/patient expects to be discharged to:: Private residence Living Arrangements: Spouse/significant other Available Help at Discharge: Family;Available PRN/intermittently (wife says she can take off as needed) Type of Home: House Home Access: Level entry     Home Layout: One level     Bathroom Shower/Tub: Occupational psychologist: Standard     Home Equipment:  (can borrow a 3n1 and rollator if needed)          Prior Functioning/Environment Level of Independence: Independent             OT Diagnosis: Generalized weakness;Acute pain   OT Problem List: Decreased strength;Decreased range of motion;Impaired UE functional use;Pain;Impaired balance (sitting and/or standing)   OT Treatment/Interventions: Self-care/ADL training;DME and/or AE instruction;Therapeutic activities;Therapeutic exercise;Patient/family education;Balance training    OT Goals(Current goals can be found in the care plan section) Acute Rehab OT Goals 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 [employer] OT Goal Formulation: With patient Time For Goal Achievement: 10/30/14 Potential to Achieve Goals: Good  OT Frequency: Min 2X/week              End of Session Nurse Communication:  (daugher requesting inspirometer)  Activity Tolerance: Patient tolerated treatment well Patient left: in chair;with call bell/phone within reach   Time: 1201-1234 OT Time Calculation (min): 33 min Charges:  OT General Charges $OT Visit: 1 Procedure OT Evaluation $Initial OT Evaluation Tier I: 1 Procedure OT Treatments $Therapeutic Exercise: 8-22 mins  Almon Register [phone] 10/23/2014, 2:12 PM

## 2014-10-23 NOTE — Progress Notes (Signed)
Subjective: No abdominal pain. No blood in stool.  Objective: Vital signs in last 24 hours: Temp:  [97.8 F (36.6 C)-100.2 F (37.9 C)] 98 F (36.7 C) (08/29 0836) Pulse Rate:  [19-140] 19 (08/29 1224) Resp:  [19-30] 19 (08/29 1224) BP: (104-130)/(63-82) 110/66 mmHg (08/29 1224) SpO2:  [90 %-96 %] 96 % (08/29 1224) Weight:  [76 kg (167 lb 8.8 oz)] 76 kg (167 lb 8.8 oz) (08/29 1224) Weight change:  Last BM Date: 10/20/14  PE: GEN:  Somewhat short-of-breath, but is not acutely toxic-appearing ABD:  Soft, non-tender  Lab Results: CBC    Component Value Date/Time   WBC 15.3* 10/23/2014 0130   RBC 3.61* 10/23/2014 0130   RBC 3.24* 10/18/2014 1445   HGB 10.0* 10/23/2014 0130   HCT 30.5* 10/23/2014 0130   PLT 324 10/23/2014 0130   MCV 84.5 10/23/2014 0130   MCH 27.7 10/23/2014 0130   MCHC 32.8 10/23/2014 0130   RDW 15.4 10/23/2014 0130   LYMPHSABS 1.7 10/21/2014 2048   MONOABS 1.8* 10/21/2014 2048   EOSABS 0.1 10/21/2014 2048   BASOSABS 0.0 10/21/2014 2048   CMP     Component Value Date/Time   NA 131* 10/23/2014 0130   K 4.6 10/23/2014 0130   CL 98* 10/23/2014 0130   CO2 28 10/23/2014 0130   GLUCOSE 125* 10/23/2014 0130   BUN <5* 10/23/2014 0130   CREATININE 1.00 10/23/2014 0130   CALCIUM 8.6* 10/23/2014 0130   PROT 5.7* 10/21/2014 2048   ALBUMIN 1.9* 10/21/2014 2048   AST 49* 10/21/2014 2048   ALT 28 10/21/2014 2048   ALKPHOS 85 10/21/2014 2048   BILITOT 0.5 10/21/2014 2048   GFRNONAA >60 10/23/2014 0130   GFRAA >60 10/23/2014 0130   Assessment:  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 2.  Abnormal ultrasound, splenic abnormality of unclear significance (mass versus other, 4 x 5 cm in size).  Plan:  1.  Continue antibiotics for endocarditis. 2.  Heart catheterization planned for today. 3.  Colonoscopy tomorrow, tentatively scheduled at noon, at request of cardiology and cardiothoracic teams, to exclude colonic etiology for patient's enterococcal  endocarditis.  Dr. Roxy Manns relays patient is acceptable to undergo colonoscopy. 4.  Pending colonoscopy findings, might need to consider further evaluation of splenic lesion prior to pursuit of valve surgery. 5.  [facility] will follow.   Landry Dyke 10/23/2014, 12:53 PM   Pager [phone] If no answer or after 5 PM call [phone]

## 2014-10-23 NOTE — H&P (View-Only) (Signed)
SUBJECTIVE:  He is not having SOB or chest pain but he does have back pain   PHYSICAL EXAM Filed Vitals:   10/22/14 1639 10/22/14 1946 10/23/14 0008 10/23/14 0400  BP: 130/77 109/63 119/74 104/70  Pulse: 129 111 124 140  Temp:  100 F (37.8 C) 99 F (37.2 C) 97.8 F (36.6 C)  TempSrc:  Oral Oral Oral  Resp: 29 23 30 19   Height:      Weight:      SpO2: 94% 90% 94% 90%   General:  Looks weak but not acute distress Lungs:  Decreased breath sounds Heart:  RRR Abdomen:  Positive bowel sounds, no rebound no guarding Extremities:  No edema Neuro:  Halting speech.  Otherwise nonfocal.  LABS: Lab Results  Component Value Date   TROPONINI 0.15* 10/23/2014   Results for orders placed or performed during the hospital encounter of 10/17/14 (from the past 24 hour(s))  Gentamicin level, peak     Status: Abnormal   Collection Time: 10/22/14  9:04 AM  Result Value Ref Range   Gentamicin Pk 2.3 (L) 5.0 - 10.0 ug/mL  Troponin I     Status: Abnormal   Collection Time: 10/22/14  9:04 AM  Result Value Ref Range   Troponin I 0.19 (H) <0.031 ng/mL  Troponin I     Status: Abnormal   Collection Time: 10/22/14  3:10 PM  Result Value Ref Range   Troponin I 0.16 (H) <0.031 ng/mL  Troponin I     Status: Abnormal   Collection Time: 10/22/14  7:55 PM  Result Value Ref Range   Troponin I 0.19 (H) <0.031 ng/mL  CBC     Status: Abnormal   Collection Time: 10/23/14  1:30 AM  Result Value Ref Range   WBC 15.3 (H) 4.0 - 10.5 K/uL   RBC 3.61 (L) 4.22 - 5.81 MIL/uL   Hemoglobin 10.0 (L) 13.0 - 17.0 g/dL   HCT 30.5 (L) 39.0 - 52.0 %   MCV 84.5 78.0 - 100.0 fL   MCH 27.7 26.0 - 34.0 pg   MCHC 32.8 30.0 - 36.0 g/dL   RDW 15.4 11.5 - 15.5 %   Platelets 324 150 - 400 K/uL  Basic metabolic panel     Status: Abnormal   Collection Time: 10/23/14  1:30 AM  Result Value Ref Range   Sodium 131 (L) 135 - 145 mmol/L   Potassium 4.6 3.5 - 5.1 mmol/L   Chloride 98 (L) 101 - 111 mmol/L   CO2 28 22 -  32 mmol/L   Glucose, Bld 125 (H) 65 - 99 mg/dL   BUN <5 (L) 6 - 20 mg/dL   Creatinine, Ser 1.00 0.61 - 1.24 mg/dL   Calcium 8.6 (L) 8.9 - 10.3 mg/dL   GFR calc non Af Amer >60 >60 mL/min   GFR calc Af Amer >60 >60 mL/min   Anion gap 5 5 - 15  Troponin I     Status: Abnormal   Collection Time: 10/23/14  1:30 AM  Result Value Ref Range   Troponin I 0.15 (H) <0.031 ng/mL    Intake/Output Summary (Last 24 hours) at 10/23/14 0827 Last data filed at 10/23/14 0600  Gross per 24 hour  Intake   2453 ml  Output   2600 ml  Net   -147 ml     ASSESSMENT AND PLAN:  PAF/FLUTTER:    In an out of atrial fib flutter.  Rate OK.    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  He is to get GI workup with colonoscopy tomorrow.  In anticipation of valve surgery he will need a cardiac cath.  I will put him on the board for later today since the colonoscopy has been delayed until tomorrow.   The patient understands that risks included but are not limited to stroke (1 in 1000), death (1 in 87), kidney failure [usually temporary] (1 in 500), bleeding (1 in 200), allergic reaction [possibly serious] (1 in 200).  The patient understands and agrees to proceed.   I discussed today the possible timing of surgery with Dr. Roxy Manns.  It might be Friday or early next week.    MILDLY ELEVATED TROPONIN:  As above.   Jeneen Rinks [facility] 10/23/2014 8:27 AM

## 2014-10-24 ENCOUNTER — Inpatient Hospital Stay (HOSPITAL_COMMUNITY): Payer: BC Managed Care – PPO

## 2014-10-24 ENCOUNTER — Encounter (HOSPITAL_COMMUNITY): Payer: Self-pay | Admitting: Cardiovascular Disease

## 2014-10-24 DIAGNOSIS — R42 Dizziness and giddiness: Secondary | ICD-10-CM

## 2014-10-24 DIAGNOSIS — I38 Endocarditis, valve unspecified: Secondary | ICD-10-CM

## 2014-10-24 DIAGNOSIS — E43 Unspecified severe protein-calorie malnutrition: Secondary | ICD-10-CM | POA: Diagnosis present

## 2014-10-24 DIAGNOSIS — G9340 Encephalopathy, unspecified: Secondary | ICD-10-CM

## 2014-10-24 DIAGNOSIS — D735 Infarction of spleen: Secondary | ICD-10-CM

## 2014-10-24 DIAGNOSIS — I39 Endocarditis and heart valve disorders in diseases classified elsewhere: Secondary | ICD-10-CM

## 2014-10-24 DIAGNOSIS — D7389 Other diseases of spleen: Secondary | ICD-10-CM

## 2014-10-24 DIAGNOSIS — Z8679 Personal history of other diseases of the circulatory system: Secondary | ICD-10-CM | POA: Diagnosis present

## 2014-10-24 DIAGNOSIS — I70209 Unspecified atherosclerosis of native arteries of extremities, unspecified extremity: Secondary | ICD-10-CM

## 2014-10-24 DIAGNOSIS — I350 Nonrheumatic aortic (valve) stenosis: Secondary | ICD-10-CM

## 2014-10-24 DIAGNOSIS — D649 Anemia, unspecified: Secondary | ICD-10-CM

## 2014-10-24 DIAGNOSIS — I059 Rheumatic mitral valve disease, unspecified: Secondary | ICD-10-CM

## 2014-10-24 DIAGNOSIS — I351 Nonrheumatic aortic (valve) insufficiency: Secondary | ICD-10-CM

## 2014-10-24 HISTORY — DX: Infarction of spleen: D73.5

## 2014-10-24 LAB — CBC
HCT: 27.8 % — ABNORMAL LOW (ref 39.0–52.0)
HEMOGLOBIN: 8.8 g/dL — AB (ref 13.0–17.0)
MCH: 26.9 pg (ref 26.0–34.0)
MCHC: 31.7 g/dL (ref 30.0–36.0)
MCV: 85 fL (ref 78.0–100.0)
Platelets: 344 10*3/uL (ref 150–400)
RBC: 3.27 MIL/uL — ABNORMAL LOW (ref 4.22–5.81)
RDW: 15.5 % (ref 11.5–15.5)
WBC: 8.4 10*3/uL (ref 4.0–10.5)

## 2014-10-24 LAB — TROPONIN I
TROPONIN I: 0.07 ng/mL — AB (ref <0.031)
TROPONIN I: 0.07 ng/mL — AB (ref <0.031)
TROPONIN I: 0.07 ng/mL — AB (ref <0.031)
Troponin I: 0.08 ng/mL — ABNORMAL HIGH (ref <0.031)

## 2014-10-24 LAB — BASIC METABOLIC PANEL
Anion gap: 7 (ref 5–15)
BUN: 6 mg/dL (ref 6–20)
CHLORIDE: 98 mmol/L — AB (ref 101–111)
CO2: 25 mmol/L (ref 22–32)
CREATININE: 0.97 mg/dL (ref 0.61–1.24)
Calcium: 8.3 mg/dL — ABNORMAL LOW (ref 8.9–10.3)
GFR calc Af Amer: >60 mL/min (ref >60)
GFR calc non Af Amer: >60 mL/min (ref >60)
GLUCOSE: 104 mg/dL — AB (ref 65–99)
Potassium: 4 mmol/L (ref 3.5–5.1)
SODIUM: 130 mmol/L — AB (ref 135–145)

## 2014-10-24 LAB — GLUCOSE, CAPILLARY: GLUCOSE-CAPILLARY: 98 mg/dL (ref 65–99)

## 2014-10-24 MED ORDER — INFLUENZA VAC SPLIT QUAD 0.5 ML IM SUSY: 0.5000 mL | PREFILLED_SYRINGE | INTRAMUSCULAR | Status: DC

## 2014-10-24 MED ORDER — PEG-KCL-NACL-NASULF-NA ASC-C 100 G PO SOLR
0.5000 | Freq: Once | ORAL | Status: AC
  Administered 2014-10-24: 100 g via ORAL
  Filled 2014-10-24: qty 1

## 2014-10-24 MED ORDER — PEG-KCL-NACL-NASULF-NA ASC-C 100 G PO SOLR
0.5000 | Freq: Once | ORAL | Status: AC
  Administered 2014-10-25: 100 g via ORAL
  Filled 2014-10-24: qty 1

## 2014-10-24 MED ORDER — CHLORHEXIDINE GLUCONATE 0.12 % MT SOLN
15.0000 mL | Freq: Two times a day (BID) | OROMUCOSAL | Status: DC
Start: 2014-10-24 — End: 2014-11-12
  Administered 2014-10-25 – 2014-11-12 (×24): 15 mL via OROMUCOSAL
  Filled 2014-10-24 (×28): qty 15

## 2014-10-24 MED ORDER — GADOBENATE DIMEGLUMINE 529 MG/ML IV SOLN
14.0000 mL | Freq: Once | INTRAVENOUS | Status: AC | PRN
Start: 2014-10-24 — End: 2014-10-24
  Administered 2014-10-24: 18:00:00 14 mL via INTRAVENOUS

## 2014-10-24 MED ORDER — PEG-KCL-NACL-NASULF-NA ASC-C 100 G PO SOLR: 1.0000 | Freq: Once | ORAL | Status: DC

## 2014-10-24 MED ORDER — SODIUM CHLORIDE 0.9 % IV SOLN
INTRAVENOUS | Status: DC
  Administered 2014-10-24: 07:00:00 via INTRAVENOUS

## 2014-10-24 NOTE — Progress Notes (Signed)
[address]       [place] [address]             [phone]     CARDIOTHORACIC SURGERY PROGRESS NOTE  1 Day Post-Op  S/P Procedure(s) (LRB): Right/Left Heart Cath and Coronary Angiography (N/A)  Subjective: Feels extremely weak.  Patient was unable to lift the jug of bowel prep by himself to poor a cup.  Appetite has finally started to improve, but he hasn't been able to eat anything other than clear liquids since Sunday due to bowel prep.  No SOB.  Objective: Vital signs in last 24 hours: Temp:  [98.1 F (36.7 C)-99.1 F (37.3 C)] 98.2 F (36.8 C) (08/30 1646) Pulse Rate:  [85-97] 93 (08/30 1646) Cardiac Rhythm:  [-] Normal sinus rhythm (08/30 0800) Resp:  [14-24] 18 (08/30 1646) BP: (85-122)/(37-71) 120/68 mmHg (08/30 1646) SpO2:  [92 %-94 %] 93 % (08/30 1646) Weight:  [76.4 kg (168 lb 6.9 oz)] 76.4 kg (168 lb 6.9 oz) (08/30 0330)  Physical Exam:  Rhythm:   sinus  Breath sounds: clear  Heart sounds:  RRR w/ diastolic murmur  Incisions:  n/a  Abdomen:  soft  Extremities:  Warm, + edema   Intake/Output from previous day: 08/29 0701 - 08/30 0700 In: 1226.6 [I.V.:1026.6; IV Piggyback:200] Out: 1100 [Urine:1100] Intake/Output this shift:    Lab Results:  Recent Labs  10/23/14 0130 10/24/14 0150  WBC 15.3* 8.4  HGB 10.0* 8.8*  HCT 30.5* 27.8*  PLT 324 344   BMET:  Recent Labs  10/23/14 0130 10/24/14 0150  NA 131* 130*  K 4.6 4.0  CL 98* 98*  CO2 28 25  GLUCOSE 125* 104*  BUN <5* 6  CREATININE 1.00 0.97  CALCIUM 8.6* 8.3*    CBG (last 3)   Recent Labs  10/22/14 0806 10/23/14 0823 10/24/14 0623  GLUCAP 108* 108* 98   PT/INR:  No results for input(s): LABPROT, INR in the last 72 hours.  CXR:  N/A    CARDIAC CATHETERIZATION  Procedures    Right/Left Heart Cath and Coronary Angiography    PACS Images    Show images for Cardiac catheterization     Link to Procedure Log    Procedure Log      Indications     Bacterial endocarditis [I33.0 (ICD-10-CM)]    Technique and Indications    Estimated blood loss <50 mL. There were no immediate complications during the procedure.    Conclusion     Ost LAD lesion, 60% stenosed.  Prox LAD to Mid LAD lesion, 70% stenosed.  Ost LPDA to LPDA lesion, 90% stenosed.  Ost 2nd Mrg to 2nd Mrg lesion, 70% stenosed.  2nd Mrg lesion, 50% stenosed.  Alan [name] is a 64 y.o. male   [id number] LOCATION: FACILITY: [facility]  PHYSICIAN: Quay Burow, M.D. 02/15/1951   DATE OF PROCEDURE: 10/23/2014  DATE OF DISCHARGE:     CARDIAC CATHETERIZATION     History obtained from chart review. 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 He has severe aortic insufficiency. He presents now for right and left heart catheter to define his coronary anatomy and physiology     IMPRESSION:Alan [name] has disease in his LAD and circumflex coronary arteries. He has a left dominant system. His aortic valve was not crossed. His filling pressures are unremarkable. He has a relatively high cardiac output. He will probably need coronary artery bypass grafting  at the time of aortic valve replacement. The sheaths were removed and pressure held on the groin to achieve hemostasis. The patient left the cardiac cath lab in stable condition.  Quay Burow. MD, [facility] 10/23/2014 2:57 PM        Coronary Findings    Dominance: Left   Left Anterior Descending   . Ost LAD lesion, 60% stenosed.   . Prox LAD to Mid LAD lesion, 70% stenosed.     Left Circumflex   . Second [employer]   . Ost 2nd Mrg to 2nd Mrg lesion, 70% stenosed.   . 2nd Mrg lesion, 50% stenosed.   . Left Posterior Descending Artery   . Ost LPDA to LPDA lesion, 90% stenosed.       Right Heart Pressures Right atrial pressure: 8/6 Right ventricular pressure: 40/9, end-diastolic pressure 7 Pulmonary artery  pressure: 30/10, mean equals 19 Pulmonary pulmonary Wedge pressure: A wave 13, V wave 9, mean 8 Cardiac output: 7.6 L/m    Coronary Diagrams    Diagnostic Diagram            Implants    Name ID Temporary Type Supply   No information to display    Hemo Data       Most Recent Value   Thermal Cardiac Output  7.58 L/min   Thermal Cardiac Output Index  3.81 (L/min)/BSA   RA A Wave  8 mmHg   RA V Wave  6 mmHg   RA Mean  5 mmHg   RV Systolic Pressure  30 mmHg   RV Diastolic Pressure  1 mmHg   RV EDP  7 mmHg   PA Systolic Pressure  30 mmHg   PA Diastolic Pressure  10 mmHg   PA Mean  19 mmHg   PW A Wave  13 mmHg   PW V Wave  9 mmHg   PW Mean  8 mmHg   AO Systolic Pressure  91 mmHg   AO Diastolic Pressure  51 mmHg   AO Mean  66 mmHg   TPVR Index  4.99 HRUI   TSVR Index  17.32 HRUI   PVR SVR Ratio  0.18   TPVR/TSVR Ratio  0.29         MRI ABDOMEN WITHOUT AND WITH CONTRAST  TECHNIQUE: Multiplanar multisequence Alan imaging of the abdomen was performed both before and after the administration of intravenous contrast.  CONTRAST: 74mL MULTIHANCE GADOBENATE DIMEGLUMINE 529 MG/ML IV SOLN  COMPARISON: Ultrasound 10/23/2014  FINDINGS: Lower chest: There is bibasilar atelectasis and small effusions.  Hepatobiliary: No focal hepatic lesion. No biliary duct dilatation. The gallbladder is normal. Common bile duct is normal.  Pancreas: Normal pancreatic parenchymal intensity. No ductal dilatation or inflammation.  Spleen: There is a wedge-shaped geographic region of nonenhancement within the central spleen extending from the hilum to the cortex measuring 8.6 by 5.8 by 6.4 cm consistent with a large midpole splenic infarction. No splenic hematoma or abscess identified.  Adrenals/urinary tract: Adrenal glands and kidneys are normal. There is a nonenhancing cysts within the cortex of the RIGHT kidney measuring 16 mm on series  1302.  Stomach/Bowel: Stomach and limited of the small bowel is unremarkable  Vascular/Lymphatic: Abdominal aorta normal caliber. No retroperitoneal periportal lymphadenopathy.  Musculoskeletal: No aggressive osseous lesion  IMPRESSION: 1. 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 2. No evidence of renal infection, pancreatitis, or hepatic abscess. 3. No cholelithiasis or biliary obstruction. 4. Bilateral pleural effusions and basilar atelectasis.   Electronically  Signed  By: Suzy Bouchard M.D.  On: 10/24/2014 18:37    Assessment/Plan:  Patient remains clinically stable w/ no fevers or other signs of uncontrolled infection, no signs of CHF, nor the development of AV block.  Follow up blood cultures on antibiotics remain no growth so far.  Results of cath and MRI of abdomen discussed with patient.  Await results of colonoscopy.  I have discussed the relative indications for surgery with Alan [name] and his family.  They understand that he will likely need aortic valve replacement rather than repair, but I remain hopeful that his mitral valve will be repairable.  We will also plan CABG at the time of surgery. Options regarding the timing of surgery have been discussed.  Because he remains clinically stable but extremely weak and malnourished, I think it may be best to plan for surgery some time next week rather than proceeding to OR later this week.  The only down side to waiting a few extra days is the relatively small risk of repeat embolization during the interim period of time.  He understands and agrees with the plan.  I spent in excess of 30 minutes during the conduct of this hospital encounter and >50% of this time involved direct face-to-face encounter with the patient for counseling and/or coordination of their care.   Rexene Alberts 10/24/2014 7:19 PM

## 2014-10-24 NOTE — Progress Notes (Signed)
MRI rescheduled to 1700 secondary to patient drinking cl liquid lunch against instructions.  Will keep NPO until after MRI, then resume cl liquid diet before NPO post midnight. Pending bowel prep scheduled for 2000 and again at 0400.  Patient and family agreed to above plan.

## 2014-10-24 NOTE — Consult Note (Signed)
DENTAL CONSULTATION  Date of Consultation:  10/24/2014 Patient Name:   Alan [name] Date of Birth:   [birth year] Medical Record Number: [id number]  VITALS: BP 122/71 mmHg  Pulse 85  Temp(Src) 98.3 F (36.8 C) (Oral)  Resp 18  Ht 6' (1.829 m)  Wt 168 lb 6.9 oz (76.4 kg)  BMI 22.84 kg/m2  SpO2 94%  CHIEF COMPLAINT: Patient referred by Dr. Roxy Manns for a dental consultation.  HPI: Alan [name] is a 64 year old male referred by Dr. Roxy Manns for dental consultation. Patient with recent diagnosis of aortic and mitral valve endocarditis, coronary artery disease, and anticipated heart valve replacement with coronary artery bypass graft procedure. The patient is now seen as part of a medically necessary pre-heart valve surgery dental protocol evaluation.   The patient currently denies acute toothaches, swellings, or abscesses. Patient was last seen by [name] last year for an exam and cleaning. Patient indicates that he is seen on a yearly basis. The patient denies having any unmet dental needs. The patient has no partial dentures. Patient denies having orthodontic therapy, but did have multiple teeth extracted when he was younger to allow for proper alignment of his teeth by report.  PROBLEM LIST: Patient Active Problem List   Diagnosis Date Noted  . Septic embolism   . Leukocytosis   . Troponin level elevated   . Atherosclerotic peripheral vascular disease 10/20/2014  . Aortic valve insufficiency, severe 10/20/2014  . 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 Acute respiratory failure 10/19/2014  . Enterococcal bacteremia 10/19/2014  . 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 Paroxysmal atrial fibrillation 10/19/2014  . Paroxysmal atrial flutter 10/19/2014  . Acute encephalopathy 10/18/2014  . Rash 10/18/2014  . Sepsis 10/18/2014  . Normocytic anemia 10/18/2014  . Dizziness  10/18/2014  . Restless leg syndrome   . Prostate cancer     PMH: Past Medical History  Diagnosis Date  . Restless leg syndrome   . Prostate cancer   . 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 Paroxysmal atrial fibrillation 10/19/2014  . Paroxysmal atrial flutter 10/19/2014  . Enterococcal bacteremia 10/19/2014    PSH: Past Surgical History  Procedure Laterality Date  . Prostatectomy    . Cardiac catheterization N/A 10/23/2014    Procedure: Right/Left Heart Cath and Coronary Angiography;  Surgeon: Lorretta Harp, MD;  Location: [facility];  Service: Cardiovascular;  Laterality: N/A;    ALLERGIES: Allergies  Allergen Reactions  . Penicillins Swelling    Unknown reaction    MEDICATIONS: Current Facility-Administered Medications  Medication Dose Route Frequency Provider Last Rate Last Dose  . 0.9 %  sodium chloride infusion   Intravenous Continuous Arta Silence, MD      . 0.9 %  sodium chloride infusion  250 mL Intravenous PRN Lorretta Harp, MD 10 mL/hr at 10/23/14 1930 250 mL at 10/23/14 1930  . acetaminophen (TYLENOL) tablet 650 mg  650 mg Oral Q4H PRN Lorretta Harp, MD      . ampicillin (OMNIPEN) 2 g in sodium chloride 0.9 % 50 mL IVPB  2 g Intravenous Q4H Thayer Headings, MD   2 g at 10/24/14 0428  . aspirin chewable tablet 81 mg  81 mg Oral Daily Lorretta Harp, MD      .  cefTRIAXone (ROCEPHIN) 2 g in dextrose 5 % 50 mL IVPB  2 g Intravenous Q12H Michel Bickers, MD   2 g at 10/23/14 2205  . morphine 2 MG/ML injection 1 mg  1 mg Intravenous Q1H PRN Lorretta Harp, MD      . ondansetron [facility]) tablet 4 mg  4 mg Oral Q6H PRN Ivor Costa, MD       Or  . ondansetron [facility]) injection 4 mg  4 mg Intravenous Q6H PRN Ivor Costa, MD   4 mg at 10/24/14 0440  . oxyCODONE (Oxy  IR/ROXICODONE) immediate release tablet 5 mg  5 mg Oral Q6H PRN Geradine Girt, DO   5 mg at 10/23/14 2127  . polyethylene glycol-electrolytes (NuLYTELY/GoLYTELY) solution 4,000 mL  4,000 mL Oral Once Arta Silence, MD      . pramipexole (MIRAPEX) tablet 1.5 mg  1.5 mg Oral QHS Ivor Costa, MD   1.5 mg at 10/23/14 2205  . sodium chloride 0.9 % injection 3 mL  3 mL Intravenous Q12H Lorretta Harp, MD   3 mL at 10/23/14 2049  . sodium chloride 0.9 % injection 3 mL  3 mL Intravenous PRN Lorretta Harp, MD        LABS: Lab Results  Component Value Date   WBC 8.4 10/24/2014   HGB 8.8* 10/24/2014   HCT 27.8* 10/24/2014   MCV 85.0 10/24/2014   PLT 344 10/24/2014      Component Value Date/Time   NA 130* 10/24/2014 0150   K 4.0 10/24/2014 0150   CL 98* 10/24/2014 0150   CO2 25 10/24/2014 0150   GLUCOSE 104* 10/24/2014 0150   BUN 6 10/24/2014 0150   CREATININE 0.97 10/24/2014 0150   CALCIUM 8.3* 10/24/2014 0150   GFRNONAA >60 10/24/2014 0150   GFRAA >60 10/24/2014 0150   Lab Results  Component Value Date   INR 1.39 10/18/2014   No results found for: PTT  SOCIAL HISTORY: Social History   Social History  . Marital Status: Married    Spouse Name: N/A  . Number of Children: N/A  . Years of Education: N/A   Occupational History  . Not on file.   Social History Main Topics  . Smoking status: Former Research scientist (life sciences)  . Smokeless tobacco: Not on file     Comment: Smoked a pipe age 37->28  . Alcohol Use: No     Comment: Drank from age 28->38  . Drug Use: No  . Sexual Activity: Not on file   Other Topics Concern  . Not on file   Social History Narrative    FAMILY HISTORY: Family History  Problem Relation Age of Onset  . CAD Neg Hx     REVIEW OF SYSTEMS: Reviewed from chart for this admission.   DENTAL HISTORY: CHIEF COMPLAINT: Patient referred by Dr. Roxy Manns for a dental consultation.  HPI: Alan [name] is a 64 year old male referred by Dr. Roxy Manns for dental  consultation. Patient with recent diagnosis of aortic valve endocarditis, coronary artery disease, and anticipated heart valve replacement with coronary artery bypass graft procedure. The patient is now seen as part of a medically necessary pre-heart valve surgery dental protocol evaluation.   The patient currently denies acute toothaches, swellings, or abscesses. Patient was last seen by [name] last year for an exam and cleaning. Patient indicates that he is seen on a yearly basis. The patient denies having any unmet dental needs. The patient has no partial dentures. Patient denies having  orthodontic therapy, but did have multiple teeth extracted when he was younger to allow for proper alignment of his teeth by report.  DENTAL EXAMINATION: GENERAL: The patient is a well-developed, well-nourished male in no acute distress. HEAD AND NECK: There is no palpable submandibular lymphadenopathy. The patient denies acute TMJ symptoms. INTRAORAL EXAM: There is no evidence of oral abscess formation. The patient has bilateral mandibular lingual tori. DENTITION: The patient is missing tooth numbers 1, 2, 5, 12, 16, 17, 20, 29, and 32. PERIODONTAL: The patient has chronic periodontitis with plaque accumulations, selective areas of gingival recession, and no significant tooth mobility. DENTAL CARIES/SUBOPTIMAL RESTORATIONS: Patient may have recurrent caries on the distal of number 4. Patient will need to follow-up with his primary dentist for dental restoration after the anticipated heart valve surgery. ENDODONTIC: Patient currently denies acute pulpitis symptoms. There is no evidence of periapical pathology or radiolucency. CROWN AND BRIDGE: There are multiple crown restorations that appear to be acceptable. PROSTHODONTIC: Patient denies having a partial denture OCCLUSION: Patient has a stable occlusion at this time. Patient indicates that he had multiple teeth extracted as a younger patient, but did  not have braces afterwards.  RADIOGRAPHIC INTERPRETATION: An orthopantogram was taken on 10/21/2014. There are multiple missing teeth. There is supra-eruption and drifting of the unopposed teeth into the edentulous areas. There are multiple crown and dental restorations noted. There appears to be recurrent caries on the distal of tooth #4. There is no evidence of periapical pathology or radiolucency. Bilateral maxillary sinuses are noted and are well aerated.   ASSESSMENTS: 1. Bacterial endocarditis with mitral and aortic valve vegetations 2. Coronary artery disease 3. Pre-heart valve and coronary artery bypass graft surgery dental protocol 4. Chronic periodontitis with bone loss 5. Accretions 6. Selective areas of gingival recession 7. No obvious tooth mobility 8. Possible recurrent caries on the distal of #4 9. Multiple missing teeth 10. Stable occlusion  PLAN/RECOMMENDATIONS: 1. I discussed the risks, benefits, and complications of various treatment options with the patient in relationship to his medical and dental conditions, bacterial endocarditis, anticipated heart valve and coronary artery bypass graft procedures. We discussed various treatment options to include no treatment, periodontal therapy, dental restorations, root canal therapy, crown and bridge therapy, implant therapy, and replacement of missing teeth as indicated. The patient currently wishes to defer any dental treatment at this time. Patient will follow-up with [name] for continued periodontal therapy and evaluation for restoration of tooth #4 once he is medically stable. Patient understands that he will need antibiotic premedication prior to invasive dental procedures after the heart valve surgery as per American Heart Association guidelines.   2. Discussion of findings with medical team and coordination of future medical and dental care as needed.   Lenn Cal, DDS

## 2014-10-24 NOTE — Progress Notes (Signed)
Patient ID: Alan [name], male   DOB: 05-Jan-1951, 64 y.o.   MRN: [id number]         [facility]    Date of Admission:  10/17/2014   Total days of antibiotics 6        Day 3 ampicillin        Day 3 ceftriaxone         Principal Problem:   Enterococcal bacteremia Active Problems:   Aortic valve endocarditis - Enterococcus   Endocarditis of mitral valve - Enterococcus   Restless leg syndrome   Prostate cancer   Acute encephalopathy   Rash   Sepsis   Normocytic anemia   Dizziness   Acute respiratory failure   Paroxysmal atrial fibrillation   Paroxysmal atrial flutter   Atherosclerotic peripheral vascular disease   Aortic valve insufficiency, severe   Mitral valve regurgitation, infectious   Septic embolism   Leukocytosis   Troponin level elevated   Endocarditis   . ampicillin (OMNIPEN) IV  2 g Intravenous Q4H  . aspirin  81 mg Oral Daily  . cefTRIAXone (ROCEPHIN)  IV  2 g Intravenous Q12H  . chlorhexidine  15 mL Mouth/Throat BID  . peg 3350 powder  0.5 kit Oral Once   And  . [START ON 10/25/2014] peg 3350 powder  0.5 kit Oral Once  . pramipexole  1.5 mg Oral QHS  . sodium chloride  3 mL Intravenous Q12H    SUBJECTIVE: He is feeling better. His wife notes that he had a "very lucid conversation" about work with his boss today.  Review of Systems: Pertinent items are noted in HPI.  Past Medical History  Diagnosis Date  . Restless leg syndrome   . Prostate cancer   . 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 Paroxysmal atrial fibrillation 10/19/2014  . Paroxysmal atrial flutter 10/19/2014  . Enterococcal bacteremia 10/19/2014    Social History  Substance Use Topics  . Smoking status: Former Research scientist (life sciences)  . Smokeless tobacco: None     Comment: Smoked a  pipe age 47->28  . Alcohol Use: No     Comment: Drank from age 7->38    Family History  Problem Relation Age of Onset  . CAD Neg Hx    Allergies  Allergen Reactions  . Penicillins Swelling    Unknown reaction    OBJECTIVE: Filed Vitals:   10/24/14 0015 10/24/14 0330 10/24/14 0750 10/24/14 1216  BP: 92/59 122/71 109/58 110/55  Pulse:   88 97  Temp:  98.3 F (36.8 C) 98.3 F (36.8 C) 98.1 F (36.7 C)  TempSrc:  Oral Oral Oral  Resp:  $Remo'18 18 18  'jqfbs$ Height:      Weight:  168 lb 6.9 oz (76.4 kg)    SpO2:  94% 94%    Body mass index is 22.84 kg/(m^2).  General: he is alert and appears comfortable watching television Skin: no rash Lungs: clear Cor: regular S1 and S2 with no murmurs Abdomen: soft and nontender with no palpable masses Joints and extremities: Normal  Lab Results Lab Results  Component Value Date   WBC 8.4 10/24/2014   HGB 8.8* 10/24/2014   HCT 27.8* 10/24/2014   MCV 85.0 10/24/2014   PLT 344 10/24/2014    Lab Results  Component Value Date   CREATININE 0.97 10/24/2014   BUN  6 10/24/2014   NA 130* 10/24/2014   K 4.0 10/24/2014   CL 98* 10/24/2014   CO2 25 10/24/2014    Lab Results  Component Value Date   ALT 28 10/21/2014   AST 49* 10/21/2014   ALKPHOS 85 10/21/2014   BILITOT 0.5 10/21/2014     Microbiology: Recent Results (from the past 240 hour(s))  Blood Culture (routine x 2)     Status: None   Collection Time: 10/18/14  1:11 AM  Result Value Ref Range Status   Specimen Description BLOOD RIGHT ANTECUBITAL  Final   Special Requests BOTTLES DRAWN AEROBIC AND ANAEROBIC 10CC  Final   Culture  Setup Time   Final    GRAM POSITIVE COCCI IN PAIRS IN BOTH AEROBIC AND ANAEROBIC BOTTLES CRITICAL RESULT CALLED TO, READ BACK BY AND VERIFIED WITH: P CARBORNE 10/18/14 @ 1303 M VESTAL    Culture ENTEROCOCCUS SPECIES  Final   Report Status 10/20/2014 FINAL  Final   Organism ID, Bacteria ENTEROCOCCUS SPECIES  Final      Susceptibility   Enterococcus  species - MIC*    AMPICILLIN <=2 SENSITIVE Sensitive     VANCOMYCIN 1 SENSITIVE Sensitive     GENTAMICIN SYNERGY SENSITIVE Sensitive     LINEZOLID 2 SENSITIVE Sensitive     * ENTEROCOCCUS SPECIES  Blood Culture (routine x 2)     Status: None   Collection Time: 10/18/14  1:19 AM  Result Value Ref Range Status   Specimen Description BLOOD RIGHT WRIST  Final   Special Requests BOTTLES DRAWN AEROBIC AND ANAEROBIC 10CC  Final   Culture  Setup Time   Final    GRAM POSITIVE COCCI IN PAIRS IN BOTH AEROBIC AND ANAEROBIC BOTTLES CRITICAL RESULT CALLED TO, READ BACK BY AND VERIFIED WITH: P CARBORNE 10/18/14 @ 1303 M VESTAL    Culture   Final    ENTEROCOCCUS SPECIES SUSCEPTIBILITIES PERFORMED ON PREVIOUS CULTURE WITHIN THE LAST 5 DAYS.    Report Status 10/20/2014 FINAL  Final  Urine culture     Status: None   Collection Time: 10/18/14  2:56 AM  Result Value Ref Range Status   Specimen Description URINE, CATHETERIZED  Final   Special Requests NONE  Final   Culture 20,000 COLONIES/mL ENTEROCOCCUS FAECALIS  Final   Report Status 10/20/2014 FINAL  Final   Organism ID, Bacteria ENTEROCOCCUS FAECALIS  Final      Susceptibility   Enterococcus faecalis - MIC*    AMPICILLIN <=2 SENSITIVE Sensitive     LEVOFLOXACIN 1 SENSITIVE Sensitive     NITROFURANTOIN <=16 SENSITIVE Sensitive     VANCOMYCIN 1 SENSITIVE Sensitive     LINEZOLID 2 SENSITIVE Sensitive     * 20,000 COLONIES/mL ENTEROCOCCUS FAECALIS  MRSA PCR Screening     Status: None   Collection Time: 10/18/14  5:58 PM  Result Value Ref Range Status   MRSA by PCR NEGATIVE NEGATIVE Final    Comment:        The GeneXpert MRSA Assay (FDA approved for NASAL specimens only), is one component of a comprehensive MRSA colonization surveillance program. It is not intended to diagnose MRSA infection nor to guide or monitor treatment for MRSA infections.   Culture, blood (routine x 2)     Status: None (Preliminary result)   Collection Time:  10/20/14 11:10 AM  Result Value Ref Range Status   Specimen Description BLOOD RIGHT ARM  Final   Special Requests BOTTLES DRAWN AEROBIC AND ANAEROBIC 5CC  Final  Culture NO GROWTH 4 DAYS  Final   Report Status PENDING  Incomplete  Culture, blood (routine x 2)     Status: None (Preliminary result)   Collection Time: 10/20/14 11:26 AM  Result Value Ref Range Status   Specimen Description BLOOD LEFT HAND  Final   Special Requests BOTTLES DRAWN AEROBIC AND ANAEROBIC 5CC  Final   Culture NO GROWTH 4 DAYS  Final   Report Status PENDING  Incomplete   ULTRASOUND ABDOMEN COMPLETE 10/23/2014 COMPARISON: None.  FINDINGS:  Gallbladder: No gallstones or wall thickening visualized. No  sonographic Murphy sign noted.  Common bile duct: Diameter: 4 mm  Liver: No focal lesion identified. Within normal limits in  parenchymal echogenicity.  IVC: No abnormality visualized.  Pancreas: Visualized portion unremarkable.  Spleen: The spleen is enlarged measuring 13.1 cm. There is a 4.8 x  5.6 x 5.2 cm hypoechoic region within the spleen. Small splenule.  Right Kidney: Length: 12.2 cm. Normal renal cortical thickness. No  hydronephrosis. Increased renal cortical echogenicity. Within the  interpolar region there is a 1.6 cm cyst.  Left Kidney: Length: 12 cm. No hydronephrosis. Normal renal cortical  thickness. Increased renal cortical echogenicity.  Abdominal aorta: Not visualized due to overlying bowel gas.  Other findings: Bilateral pleural effusions.  IMPRESSION:  Large irregular hypoechoic lesion within the spleen which is  incompletely characterized on ultrasound. While splenic infarct is  not excluded, this is favored to represent a mass lesion. Given the  size of the lesion and no priors for comparison, when patient  clinically able, recommend evaluation with MRI.  Mild splenomegaly.  Increased renal cortical echogenicity as can be seen with chronic  medical renal disease.  Bilateral pleural  effusions.  These results will be called to the ordering clinician or  representative by the Radiologist Assistant, and communication  documented in the PACS or zVision Dashboard.  Electronically Signed  By: Lovey Newcomer M.D.  On: 10/23/2014 11:25    ASSESSMENT: He is tolerating his current 2 drug antibiotic regimen 4 severe enterococcal endocarditis and repeat blood cultures are negative at 4 days. His abdominal ultrasound shows hypoechoic lesion in the spleen. It is not clear if that represents an embolic event related to his endocarditis or a mass lesion. This will require further evaluation.  PLAN: 1. Continue ampicillin and ceftriaxone  Michel Bickers, MD [facility] [place] [facility] [phone] pager   [phone] cell 10/24/2014, 3:43 PM

## 2014-10-24 NOTE — Progress Notes (Signed)
Patient drank 1/2 bottle of golytely, patient refusing to drink the other half of the bottle of golytely.  Pt educated on importance of drinking the enema.  Pt stated "Even if they can't do the procedure, I still can't drink it".  Will continue to monitor patient.

## 2014-10-24 NOTE — Progress Notes (Signed)
Subjective: Drank about half prep, stopped due to prohibitive nausea. Nursing reports to our on call doctor and to our endoscopy nursing staff reported semi-liquid stools which where not clear; patient disagrees.  Objective: Vital signs in last 24 hours: Temp:  [98.1 F (36.7 C)-99.1 F (37.3 C)] 98.3 F (36.8 C) (08/30 0750) Pulse Rate:  [19-96] 88 (08/30 0750) Resp:  [13-59] 18 (08/30 0750) BP: (85-127)/(37-78) 109/58 mmHg (08/30 0750) SpO2:  [0 %-96 %] 94 % (08/30 0750) Weight:  [76 kg (167 lb 8.8 oz)-76.4 kg (168 lb 6.9 oz)] 76.4 kg (168 lb 6.9 oz) (08/30 0330) Weight change:  Last BM Date: 10/24/14  PE: GEN:  Chronically ill-appearing, somewhat tachypneic at rest  Lab Results: CBC    Component Value Date/Time   WBC 8.4 10/24/2014 0150   RBC 3.27* 10/24/2014 0150   RBC 3.24* 10/18/2014 1445   HGB 8.8* 10/24/2014 0150   HCT 27.8* 10/24/2014 0150   PLT 344 10/24/2014 0150   MCV 85.0 10/24/2014 0150   MCH 26.9 10/24/2014 0150   MCHC 31.7 10/24/2014 0150   RDW 15.5 10/24/2014 0150   LYMPHSABS 1.7 10/21/2014 2048   MONOABS 1.8* 10/21/2014 2048   EOSABS 0.1 10/21/2014 2048   BASOSABS 0.0 10/21/2014 2048   CMP     Component Value Date/Time   NA 130* 10/24/2014 0150   K 4.0 10/24/2014 0150   CL 98* 10/24/2014 0150   CO2 25 10/24/2014 0150   GLUCOSE 104* 10/24/2014 0150   BUN 6 10/24/2014 0150   CREATININE 0.97 10/24/2014 0150   CALCIUM 8.3* 10/24/2014 0150   PROT 5.7* 10/21/2014 2048   ALBUMIN 1.9* 10/21/2014 2048   AST 49* 10/21/2014 2048   ALT 28 10/21/2014 2048   ALKPHOS 85 10/21/2014 2048   BILITOT 0.5 10/21/2014 2048   GFRNONAA >60 10/24/2014 0150   GFRAA >60 10/24/2014 0150   Assessment:  1.  Enterococcal endocarditis.   2.  Coronary artery disease, with significant stenoses on yesterday's catheterization. 3.  Splenic lesion unclear significance.  Plan:  1. Clear liquids today, NPO after midnight. 2.  I very much doubt that patient is adequately  prepped today, based both on nursing reports and volume (half) of prep consumed; accordingly, we will cancel colonoscopy today and reschedule for tomorrow afternoon at 1 pm (earliest anesthesia time available). 3.  Will try Moviprep to see if that is a little better tolerated. 4.  Will likely need MRI if possible prior to cardiac interventions and after colonoscopy. 5.  Will follow.   Landry Dyke 10/24/2014, 9:15 AM   Pager [phone] If no answer or after 5 PM call [phone]

## 2014-10-24 NOTE — Progress Notes (Signed)
Patient ID: Alan [name], male   DOB: [birth year], 64 y.o.   MRN: [id number] [facility] PROGRESS NOTE  JOSIAN LANESE UYQ:[id number] DOB: Jul 04, 1950 DOA: 10/17/2014 PCP: [name], MD  Subjective:   Had relatively low blood pressure last night, stopped the colonoscopy preparation because of nausea. Still has semi-loose stools, colonoscopy postponed until tomorrow.  Brief narrative:    64 y.o. male with past medical history of prostate cancer, RLS who presented to [facility] with worsening symptoms of dizziness and gait instability. He had some mental status changes and was lethargic prior to admission for which reason his wife brought him in to ED. Pt has been working in a yard a lot and per wife she was concerned that he may have had a tick bit.  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 Patient has been seen by ID and cardiology in consultation. He is showing an improvement although slow on therapy with Omnipen and Rocephin.  Barrier to discharge: Plan for cardiac cath today which pt needs in anticipation of valve surgery. He is also scheduled to go for colonoscopy but did not have prep yesterday so colonoscopy will be done 8/30.  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 Urine culture also grew enterococcus species. Repeat blood cultures on 10/20/2014 shows no growth to date. - MRI brain on 10/20/2014 showed scattered small acute to subacute and occasional early chronic, lacunar infarcts in both MCA territories and the left cerebellum. consider sequelae of endocarditis and septic emboli in this setting.  - Pt is status post TEE which demonstrated mild MR with vegetation on atrial surface of anterior mitral leaflet as well as severe AR  with large mobile vegetation on the non coronary cusp. ID was consulted and pt initially on vanco, gentamycin but currently on Omnipen and rocephin. So far tolerating this therapy well with no signs of adverse reaction.  - Plan is for cardiac cath today in anticipation of valve surgery. - Continue to monitor in SDU due to acuity of medical issues at present.  - Of note, studies such as UDS, HIV Ab, RPR all normal. - Continue Rocephin and ampicillin  Active Problems: Paroxysmal atrial fibrillation /Atrial flutter - CHADS vasc score at least 3 (Age, history of CVA) - So far rate relatively controlled - Appreciate cardiology following   Troponin elevation - Likely demand ischemia from acute septic emboli and bacteremia - Plan for cardiac cath today - Continue daily aspirin  History of  CVA  - As evidenced on MRI brain, pt has chronic anterior division left MCA infarct and a small number of chronic micro hemorrhages are seen in both hemispheres in the left cerebellum - Continue daily aspirin - Appreciate physical therapy evaluation   Diarrhea - GI pathogen panel negative  Restless leg syndrome - Continue Pramipexole  Normocytic anemia - Hemoglobin stable at 10 - No current indications for transfusion - Plan for colonoscopy tomorrow   Dizziness - Likely combination of previous stroke and septic emboli - Appreciate PT evaluation   Splenic lesion - Questionable splenic infarct seen on abd [place] but need MRI for better evaluation. MRI pending.    DVT Prophylaxis  - SCD's bilaterally    Code Status: Full.  Family Communication:  plan of care discussed with the patient Disposition Plan:   IV access:  Peripheral IV  Procedures and diagnostic studies:    Dg Orthopantogram 10/21/2014 1. No fracture or bone lesion. No evidence of a dental abscess or untreated carie.   Electronically Signed   By: Lajean Manes M.D.   On: 10/21/2014 10:09   [name] Head Wo Contrast 10/20/2014    1.  Scattered small acute to subacute, and occasional early chronic, lacunar infarcts in both MCA territories and the left cerebellum. None of these appear hemorrhagic (cerebral septic emboli are prone to bleed), but consider sequelae of endocarditis and septic emboli in this setting. No significant edema or mass effect. 2. Chronic anterior division left MCA infarct. A small number of chronic micro hemorrhages are identified in both hemispheres an the left cerebellum. 3. Mild for age intracranial atherosclerosis with no stenosis or branch occlusion identified. Mild intracranial artery dolichoectasia.   Electronically Signed   By: Genevie Ann M.D.   On: 10/20/2014 09:49   Mr [name] Contrast 10/20/2014  Scattered small acute to subacute, and occasional early chronic, lacunar infarcts in both MCA territories and the left cerebellum. None of these appear hemorrhagic (cerebral septic emboli are prone to bleed), but consider sequelae of endocarditis and septic emboli in this setting. No significant edema or mass effect. 2. Chronic anterior division left MCA infarct. A small number of chronic micro hemorrhages are identified in both hemispheres an the left cerebellum. 3. Mild for age intracranial atherosclerosis with no stenosis or branch occlusion identified. Mild intracranial artery dolichoectasia.   Electronically Signed   By: Genevie Ann M.D.   On: 10/20/2014 09:49   US Abdomen Complete 10/23/2014   Large irregular hypoechoic lesion within the spleen which is incompletely characterized on ultrasound. While splenic infarct is not excluded, this is favored to represent a mass lesion. Given the size of the lesion and no priors for comparison, when patient clinically able, recommend evaluation with MRI.  Mild splenomegaly.  Increased renal cortical echogenicity as can be seen with chronic medical renal disease.  Bilateral pleural effusions.  These results will be called to the ordering clinician or representative by the  Radiologist Assistant, and communication documented in the PACS or zVision Dashboard.   Electronically Signed   By: Lovey Newcomer M.D.   On: 10/23/2014 11:25   Dg Chest 2 View 10/18/2014 No active cardiopulmonary disease.   Electronically Signed   By: Lucienne Capers M.D.   On: 10/18/2014 00:49   Cardiac catheterization 10/23/2014   Medical Consultants:  Cardiology [facility] (Dr. Arta Silence) Infectious disease  Other Consultants:  Physical therapy  IAnti-Infectives:         Birdie Hopes, MD  [facility] Pager [phone]  Time spent in minutes: 25 minutes  If 7PM-7AM, please contact night-coverage [email or web address] Password [email or web address] 10/24/2014, 1:14 PM   LOS: 6 days     Objective: Filed Vitals:   10/24/14 0004 10/24/14 0015 10/24/14 0330 10/24/14 0750  BP: 88/47 92/59 122/71 109/58  Pulse:    88  Temp:   98.3 F (36.8 C) 98.3 F (36.8 C)  TempSrc:   Oral Oral  Resp: _0 Height:      Weight:   76.4 kg (168 lb 6.9 oz)   SpO2:   94% 94%    Intake/Output Summary (Last 24 hours) at 10/24/14 1314 Last data filed at 10/24/14 0428  Gross per 24 hour  Intake 1226.6 ml  Output    900 ml  Net  326.6 ml    Exam:  General:  Pt is alert, follows commands appropriately, not in acute distress  Cardiovascular: tachycardic, S1/S2 appreciated   Respiratory: Clear to auscultation bilaterally, no wheezing, no crackles, no rhonchi  Abdomen: Soft, non tender, non distended, bowel sounds present  Extremities: No edema, pulses DP and PT palpable bilaterally  Neuro: Grossly nonfocal  Data Reviewed: Basic Metabolic Panel:  Recent Labs Lab 10/18/14 1008 10/19/14 0310 10/21/14 0236 10/21/14 2048 10/23/14 0130 10/24/14 0150  NA  --  134* 134* 133* 131* 130*  K  --  3.7 4.3 4.3 4.6 4.0  CL  --  106 103 100* 98* 98*  CO2  --  _0 GLUCOSE  --  122* 109* 119* 125* 104*  BUN  --  10 5* 6 <5* 6  CREATININE  --  0.94 0.94 1.08 1.00 0.97  CALCIUM   --  8.4* 8.4* 8.5* 8.6* 8.3*  MG 1.7  --   --  1.7  --   --   PHOS  --   --   --  3.4  --   --    Liver Function Tests:  Recent Labs Lab 10/18/14 0125 10/21/14 2048  AST 22 49*  ALT 18 28  ALKPHOS 40 85  BILITOT 0.8 0.5  PROT 6.4* 5.7*  ALBUMIN 2.4* 1.9*    Recent Labs Lab 10/18/14 0125  LIPASE 72*   No results for input(s): AMMONIA in the last 168 hours. CBC:  Recent Labs Lab 10/18/14 0125 10/19/14 0310 10/21/14 0236 10/21/14 2048 10/23/14 0130 10/24/14 0150  WBC 8.9 6.3 11.6* 14.3* 15.3* 8.4  NEUTROABS 7.0  --   --  10.7*  --   --   HGB 9.2* 8.8* 10.0* 10.3* 10.0* 8.8*  HCT 28.4* 27.2* 30.9* 31.0* 30.5* 27.8*  MCV 84.3 84.2 83.1 84.0 84.5 85.0  PLT 231 212 302 294 324 344   Cardiac Enzymes:  Recent Labs Lab 10/23/14 0730 10/23/14 1330 10/23/14 2009 10/24/14 0150 10/24/14 0820  TROPONINI 0.11* 0.12* 0.09* 0.08* 0.07*   BNP: Invalid input(s): POCBNP CBG:  Recent Labs Lab 10/20/14 0810 10/21/14 0750 10/22/14 0806 10/23/14 0823 10/24/14 0623  GLUCAP 99 100* 108* 108* 98    Blood Culture (routine x 2)     Status: None   Collection Time: 10/18/14  1:11 AM  Result Value Ref Range Status   Specimen Description BLOOD RIGHT ANTECUBITAL  Final   Report Status 10/20/2014 FINAL  Final   Organism ID, Bacteria ENTEROCOCCUS SPECIES  Final      Susceptibility   Enterococcus species - MIC*    AMPICILLIN <=2 SENSITIVE Sensitive     VANCOMYCIN 1 SENSITIVE Sensitive     GENTAMICIN SYNERGY SENSITIVE Sensitive     LINEZOLID 2 SENSITIVE Sensitive     * ENTEROCOCCUS SPECIES  Blood Culture (routine x 2)     Status: None   Collection Time: 10/18/14  1:19 AM  Result Value Ref Range Status   Specimen Description BLOOD RIGHT WRIST  Final    ENTEROCOCCUS SPECIES    Report Status 10/20/2014 FINAL  Final  Urine culture     Status: None   Collection Time: 10/18/14  2:56 AM  Result Value Ref Range Status   Specimen Description URINE, CATHETERIZED  Final    Special Requests NONE  Final   Report Status 10/20/2014 FINAL  Final   Organism ID, Bacteria ENTEROCOCCUS FAECALIS  Final      Susceptibility   Enterococcus faecalis - MIC*  AMPICILLIN <=2 SENSITIVE Sensitive     LEVOFLOXACIN 1 SENSITIVE Sensitive     NITROFURANTOIN <=16 SENSITIVE Sensitive     VANCOMYCIN 1 SENSITIVE Sensitive     LINEZOLID 2 SENSITIVE Sensitive     * 20,000 COLONIES/mL ENTEROCOCCUS FAECALIS  MRSA PCR Screening     Status: None   Collection Time: 10/18/14  5:58 PM  Result Value Ref Range Status   MRSA by PCR NEGATIVE NEGATIVE Final  Culture, blood (routine x 2)     Status: None (Preliminary result)   Collection Time: 10/20/14 11:10 AM  Result Value Ref Range Status   Specimen Description BLOOD RIGHT ARM  Final   Special Requests BOTTLES DRAWN AEROBIC AND ANAEROBIC 5CC  Final   Culture NO GROWTH 3 DAYS  Final   Report Status PENDING  Incomplete  Culture, blood (routine x 2)     Status: None (Preliminary result)   Collection Time: 10/20/14 11:26 AM  Result Value Ref Range Status   Specimen Description BLOOD LEFT HAND  Final   Special Requests BOTTLES DRAWN AEROBIC AND ANAEROBIC 5CC  Final   Culture NO GROWTH 3 DAYS  Final   Report Status PENDING  Incomplete     Scheduled Meds: . ampicillin (OMNIPEN) IV  2 g Intravenous Q4H  . aspirin  81 mg Oral Daily  . cefTRIAXone (ROCEPHIN)  IV  2 g Intravenous Q12H  . peg 3350 powder  0.5 kit Oral Once   And  . [START ON 10/25/2014] peg 3350 powder  0.5 kit Oral Once  . pramipexole  1.5 mg Oral QHS  . sodium chloride  3 mL Intravenous Q12H   Continuous Infusions: . sodium chloride

## 2014-10-24 NOTE — Progress Notes (Signed)
CARDIAC REHAB PHASE I   PRE:  Rate/Rhythm: 91 SR    BP: sitting 117/70    SaO2:   MODE:  Ambulation: 400 ft   POST:  Rate/Rhythm: 100 ST with PACs    BP: sitting 120/68     SaO2:   Pt fairly steady at slow pace. C/o dizziness and palpitations after about 300 ft. Having occ PAC. Return to room. Dizziness resolved with rest. Pt isn't clear about future plans and sts his thinking is not what it was previously. Sts he has not had a CVA. To recliner. Will f/u later with preop ed. Pt thankful to walk and wants to walk more. [id number]   Josephina Shih [place] CES, ACSM 10/24/2014 10:12 AM

## 2014-10-24 NOTE — Progress Notes (Signed)
SUBJECTIVE:  He is not having SOB or chest pain but he does have shoulder pain which is improved   PHYSICAL EXAM Filed Vitals:   10/24/14 0004 10/24/14 0015 10/24/14 0330 10/24/14 0750  BP: 88/47 92/59 122/71 109/58  Pulse:    88  Temp:   98.3 F (36.8 C) 98.3 F (36.8 C)  TempSrc:   Oral Oral  Resp: 19  18 18   Height:      Weight:   168 lb 6.9 oz (76.4 kg)   SpO2:   94% 94%   General:  Looks weak but not acute distress Lungs:  Decreased breath sounds Heart:  RRR, murmurs unchanged.  Abdomen:  Positive bowel sounds, no rebound no guarding Extremities:  No edema Neuro:  Halting speech.  Otherwise nonfocal.  LABS: Lab Results  Component Value Date   TROPONINI 0.08* 10/24/2014   Results for orders placed or performed during the hospital encounter of 10/17/14 (from the past 24 hour(s))  Troponin I     Status: Abnormal   Collection Time: 10/23/14  1:30 PM  Result Value Ref Range   Troponin I 0.12 (H) <0.031 ng/mL  I-STAT 3, venous blood gas (G3P V)     Status: Abnormal   Collection Time: 10/23/14  2:30 PM  Result Value Ref Range   pH, Ven 7.411 (H) 7.250 - 7.300   pCO2, Ven 42.6 (L) 45.0 - 50.0 mmHg   pO2, Ven 28.0 (LL) 30.0 - 45.0 mmHg   Bicarbonate 27.0 (H) 20.0 - 24.0 mEq/L   TCO2 28 0 - 100 mmol/L   O2 Saturation 54.0 %   Acid-Base Excess 2.0 0.0 - 2.0 mmol/L   Sample type VENOUS    Comment NOTIFIED PHYSICIAN   I-STAT 3, arterial blood gas (G3+)     Status: Abnormal   Collection Time: 10/23/14  2:30 PM  Result Value Ref Range   pH, Arterial 7.442 7.350 - 7.450   pCO2 arterial 37.5 35.0 - 45.0 mmHg   pO2, Arterial 50.0 (L) 80.0 - 100.0 mmHg   Bicarbonate 25.6 (H) 20.0 - 24.0 mEq/L   TCO2 27 0 - 100 mmol/L   O2 Saturation 87.0 %   Acid-Base Excess 1.0 0.0 - 2.0 mmol/L   Sample type ARTERIAL   Troponin I     Status: Abnormal   Collection Time: 10/23/14  8:09 PM  Result Value Ref Range   Troponin I 0.09 (H) <0.031 ng/mL  Troponin I     Status: Abnormal   Collection Time: 10/24/14  1:50 AM  Result Value Ref Range   Troponin I 0.08 (H) <0.031 ng/mL  CBC     Status: Abnormal   Collection Time: 10/24/14  1:50 AM  Result Value Ref Range   WBC 8.4 4.0 - 10.5 K/uL   RBC 3.27 (L) 4.22 - 5.81 MIL/uL   Hemoglobin 8.8 (L) 13.0 - 17.0 g/dL   HCT 27.8 (L) 39.0 - 52.0 %   MCV 85.0 78.0 - 100.0 fL   MCH 26.9 26.0 - 34.0 pg   MCHC 31.7 30.0 - 36.0 g/dL   RDW 15.5 11.5 - 15.5 %   Platelets 344 150 - 400 K/uL  Basic metabolic panel     Status: Abnormal   Collection Time: 10/24/14  1:50 AM  Result Value Ref Range   Sodium 130 (L) 135 - 145 mmol/L   Potassium 4.0 3.5 - 5.1 mmol/L   Chloride 98 (L) 101 - 111 mmol/L   CO2 25 22 -  32 mmol/L   Glucose, Bld 104 (H) 65 - 99 mg/dL   BUN 6 6 - 20 mg/dL   Creatinine, Ser 0.97 0.61 - 1.24 mg/dL   Calcium 8.3 (L) 8.9 - 10.3 mg/dL   GFR calc non Af Amer >60 >60 mL/min   GFR calc Af Amer >60 >60 mL/min   Anion gap 7 5 - 15  Glucose, capillary     Status: None   Collection Time: 10/24/14  6:23 AM  Result Value Ref Range   Glucose-Capillary 98 65 - 99 mg/dL    Intake/Output Summary (Last 24 hours) at 10/24/14 0830 Last data filed at 10/24/14 0428  Gross per 24 hour  Intake 1226.6 ml  Output   1100 ml  Net  126.6 ml    CATH:     Ost LAD lesion, 60% stenosed.  Prox LAD to Mid LAD lesion, 70% stenosed.  Ost LPDA to LPDA lesion, 90% stenosed.  Ost 2nd Mrg to 2nd Mrg lesion, 70% stenosed.  2nd Mrg lesion, 50% stenosed.  Right Heart Pressures Right atrial pressure: 8/6 Right ventricular pressure: 55/9, end-diastolic pressure 7 Pulmonary artery pressure: 30/10, mean equals 19 Pulmonary pulmonary Wedge pressure: A wave 13, V wave 9, mean 8 Cardiac output: 7.6 L/m      ASSESSMENT AND PLAN:  PAF/FLUTTER:    In an out of atrial fib flutter.  Currently NSR.  Amio on hold secondary to low BPs.    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  Colonoscopy today.  Continuing  antibiotics per Dr. Megan Salon.  Dr. Roxy Manns will determine the timing of surgery based on the completed work up.  Cath was done yesterday.  Dr. Enrique Sack saw the patient this morning for dental evaluation.   CAD:  As above.  I will review the films and confer with Dr. Roxy Manns.  Continue medical management.    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    Jeneen Rinks Minah Axelrod 10/24/2014 8:30 AM

## 2014-10-25 ENCOUNTER — Encounter (HOSPITAL_COMMUNITY)
Admission: EM | Disposition: A | Discharge status: Home Health | Payer: Self-pay | Source: Home / Self Care | Attending: Thoracic Surgery (Cardiothoracic Vascular Surgery)

## 2014-10-25 ENCOUNTER — Inpatient Hospital Stay (HOSPITAL_COMMUNITY): Payer: BC Managed Care – PPO | Admitting: Anesthesiology

## 2014-10-25 ENCOUNTER — Encounter (HOSPITAL_COMMUNITY): Payer: Self-pay | Admitting: Thoracic Surgery (Cardiothoracic Vascular Surgery)

## 2014-10-25 ENCOUNTER — Inpatient Hospital Stay (HOSPITAL_COMMUNITY): Payer: BC Managed Care – PPO

## 2014-10-25 DIAGNOSIS — I748 Embolism and thrombosis of other arteries: Secondary | ICD-10-CM

## 2014-10-25 DIAGNOSIS — G969 Disorder of central nervous system, unspecified: Secondary | ICD-10-CM

## 2014-10-25 HISTORY — PX: COLONOSCOPY: SHX5424

## 2014-10-25 LAB — GLUCOSE, CAPILLARY: Glucose-Capillary: 103 mg/dL — ABNORMAL HIGH (ref 65–99)

## 2014-10-25 LAB — CULTURE, BLOOD (ROUTINE X 2)
CULTURE: NO GROWTH
CULTURE: NO GROWTH

## 2014-10-25 LAB — COMPREHENSIVE METABOLIC PANEL
ALT: 23 U/L (ref 17–63)
AST: 24 U/L (ref 15–41)
Albumin: 1.8 g/dL — ABNORMAL LOW (ref 3.5–5.0)
Alkaline Phosphatase: 39 U/L (ref 38–126)
Anion gap: 6 (ref 5–15)
BUN: 5 mg/dL — ABNORMAL LOW (ref 6–20)
CHLORIDE: 102 mmol/L (ref 101–111)
CO2: 27 mmol/L (ref 22–32)
CREATININE: 1.04 mg/dL (ref 0.61–1.24)
Calcium: 8.6 mg/dL — ABNORMAL LOW (ref 8.9–10.3)
Glucose, Bld: 102 mg/dL — ABNORMAL HIGH (ref 65–99)
POTASSIUM: 3.9 mmol/L (ref 3.5–5.1)
SODIUM: 135 mmol/L (ref 135–145)
Total Bilirubin: 0.4 mg/dL (ref 0.3–1.2)
Total Protein: 6 g/dL — ABNORMAL LOW (ref 6.5–8.1)

## 2014-10-25 LAB — TROPONIN I
TROPONIN I: 0.06 ng/mL — AB (ref <0.031)
TROPONIN I: 0.06 ng/mL — AB (ref <0.031)
TROPONIN I: 0.06 ng/mL — AB (ref <0.031)
Troponin I: 0.06 ng/mL — ABNORMAL HIGH (ref <0.031)

## 2014-10-25 LAB — PREALBUMIN: PREALBUMIN: 4.6 mg/dL — AB (ref 18–38)

## 2014-10-25 SURGERY — COLONOSCOPY
Anesthesia: Monitor Anesthesia Care

## 2014-10-25 MED ORDER — LACTATED RINGERS IV SOLN
INTRAVENOUS | Status: DC
  Administered 2014-10-25: 09:00:00 via INTRAVENOUS

## 2014-10-25 MED ORDER — ENSURE ENLIVE PO LIQD
237.0000 mL | Freq: Three times a day (TID) | ORAL | Status: DC
  Administered 2014-10-25 – 2014-10-31 (×16): 237 mL via ORAL
  Filled 2014-10-25 (×19): qty 237

## 2014-10-25 MED ORDER — PROPOFOL 10 MG/ML IV BOLUS
INTRAVENOUS | Status: DC | PRN
  Administered 2014-10-25: 40 mg via INTRAVENOUS
  Administered 2014-10-25: 30 mg via INTRAVENOUS
  Administered 2014-10-25 (×2): 40 mg via INTRAVENOUS
  Administered 2014-10-25: 30 mg via INTRAVENOUS
  Administered 2014-10-25: 40 mg via INTRAVENOUS

## 2014-10-25 MED ORDER — LIDOCAINE HCL (CARDIAC) 20 MG/ML IV SOLN
INTRAVENOUS | Status: DC | PRN
  Administered 2014-10-25: 60 mg via INTRAVENOUS

## 2014-10-25 MED ORDER — DEXTROSE 5 % IV SOLN
2.0000 g | INTRAVENOUS | Status: DC
  Administered 2014-10-26: 12:00:00 2 g via INTRAVENOUS
  Filled 2014-10-25 (×2): qty 2

## 2014-10-25 NOTE — Op Note (Signed)
[facility] [place], [address]   COLONOSCOPY PROCEDURE REPORT  PATIENT: Alan [name], Alan [name]  MR#: [id number] BIRTHDATE: 24-Aug-1950 , 25  yrs. old GENDER: male ENDOSCOPIST: Arta Silence, MD REFERRED WE:[id number] PROCEDURE DATE:  10-27-2014 PROCEDURE:   Colonoscopy, diagnostic ASA CLASS:   Class III INDICATIONS:enterococcal endocarditis, assess for colonic source (requested by CVTS). MEDICATIONS: Monitored anesthesia care  DESCRIPTION OF PROCEDURE:   After the risks benefits and alternatives of the procedure were thoroughly explained, informed consent was obtained.  Digital rectal exam revealed diminished anal sphincter tone, otherwise no abnormalities of the rectum.   The pediatric colonoscope was introduced through the anus and advanced to the cecum, which was identified by both the appendix and ileocecal valve. No adverse events experienced.   The quality of the prep was good.  The instrument was then slowly withdrawn as the colon was fully examined. Estimated blood loss is zero unless otherwise noted in this procedure report.    Findings:  Diminished anal sphincter tone, otherwise normal digital rectal exam.  Few medium-sized diverticula in ascending colon and sigmoid colon.  No other polyps, masses, vascular ectasias, or inflammatory changes were seen.  Retroflexed view of rectum was normal.           Withdrawal time was about 10 minutes     .  The scope was withdrawn and the procedure completed.  COMPLICATIONS:  ENDOSCOPIC IMPRESSION:     As above.  No source of enterococcal endocarditis identified.  While few diverticula were seen, there was no evidence of diverticulitis.  RECOMMENDATIONS:     1.   Watch for potential complications of procedure. 2.  Proceed with cardiac surgical management, as planned by cardiothoracic team. 3.  No further GI input at this time; will sign-off; please call with questions;  thank you for the consultation. 4.  Next routine colonoscopy in 10 years, pending patient's state-of-health at that time.  eSigned:  Arta Silence, MD 10/27/2014 9:59 AM   cc:  CPT CODES: ICD CODES:  The ICD and CPT codes recommended by this software are interpretations from the data that the clinical staff has captured with the software.  The verification of the translation of this report to the ICD and CPT codes and modifiers is the sole responsibility of the health care institution and practicing physician where this report was generated.  [employer] will not be held responsible for the validity of the ICD and CPT codes included on this report.  AMA assumes no liability for data contained or not contained herein. [employer] is a Designer, television/film set of the [employer].

## 2014-10-25 NOTE — Progress Notes (Signed)
ANTIBIOTIC CONSULT NOTE - FOLLOW UP  Pharmacy Consult for  Indication: ceftriaxone/ampicillin  Allergies  Allergen Reactions  . Penicillins Swelling    Unknown reaction    Patient Measurements: Height: 6' (182.9 cm) Weight: 167 lb 5.3 oz (75.9 kg) IBW/kg (Calculated) : 77.6  Vital Signs: Temp: 97.6 F (36.4 C) (08/31 0940) Temp Source: Oral (08/31 0940) BP: 104/79 mmHg (08/31 1000) Pulse Rate: 77 (08/31 1000) Intake/Output from previous day: 08/30 0701 - 08/31 0700 In: 718 [P.O.:240; I.V.:228; IV Piggyback:250] Out: 1400 [Urine:1400] Intake/Output from this shift: Total I/O In: 200 [I.V.:200] Out: 200 [Urine:200]  Labs:  Recent Labs  10/23/14 0130 10/24/14 0150 10/25/14 0147  WBC 15.3* 8.4  --   HGB 10.0* 8.8*  --   PLT 324 344  --   CREATININE 1.00 0.97 1.04   Estimated Creatinine Clearance: 75 mL/min (by C-G formula based on Cr of 1.04). No results for input(s): VANCOTROUGH, VANCOPEAK, VANCORANDOM, GENTTROUGH, GENTPEAK, GENTRANDOM, TOBRATROUGH, TOBRAPEAK, TOBRARND, AMIKACINPEAK, AMIKACINTROU, AMIKACIN in the last 72 hours.   Microbiology: Recent Results (from the past 720 hour(s))  Blood Culture (routine x 2)     Status: None   Collection Time: 10/18/14  1:11 AM  Result Value Ref Range Status   Specimen Description BLOOD RIGHT ANTECUBITAL  Final   Special Requests BOTTLES DRAWN AEROBIC AND ANAEROBIC 10CC  Final   Culture  Setup Time   Final    GRAM POSITIVE COCCI IN PAIRS IN BOTH AEROBIC AND ANAEROBIC BOTTLES CRITICAL RESULT CALLED TO, READ BACK BY AND VERIFIED WITH: P CARBORNE 10/18/14 @ 12 M VESTAL    Culture ENTEROCOCCUS SPECIES  Final   Report Status 10/20/2014 FINAL  Final   Organism ID, Bacteria ENTEROCOCCUS SPECIES  Final      Susceptibility   Enterococcus species - MIC*    AMPICILLIN <=2 SENSITIVE Sensitive     VANCOMYCIN 1 SENSITIVE Sensitive     GENTAMICIN SYNERGY SENSITIVE Sensitive     LINEZOLID 2 SENSITIVE Sensitive     * ENTEROCOCCUS  SPECIES  Blood Culture (routine x 2)     Status: None   Collection Time: 10/18/14  1:19 AM  Result Value Ref Range Status   Specimen Description BLOOD RIGHT WRIST  Final   Special Requests BOTTLES DRAWN AEROBIC AND ANAEROBIC 10CC  Final   Culture  Setup Time   Final    GRAM POSITIVE COCCI IN PAIRS IN BOTH AEROBIC AND ANAEROBIC BOTTLES CRITICAL RESULT CALLED TO, READ BACK BY AND VERIFIED WITH: P CARBORNE 10/18/14 @ 9 M VESTAL    Culture   Final    ENTEROCOCCUS SPECIES SUSCEPTIBILITIES PERFORMED ON PREVIOUS CULTURE WITHIN THE LAST 5 DAYS.    Report Status 10/20/2014 FINAL  Final  Urine culture     Status: None   Collection Time: 10/18/14  2:56 AM  Result Value Ref Range Status   Specimen Description URINE, CATHETERIZED  Final   Special Requests NONE  Final   Culture 20,000 COLONIES/mL ENTEROCOCCUS FAECALIS  Final   Report Status 10/20/2014 FINAL  Final   Organism ID, Bacteria ENTEROCOCCUS FAECALIS  Final      Susceptibility   Enterococcus faecalis - MIC*    AMPICILLIN <=2 SENSITIVE Sensitive     LEVOFLOXACIN 1 SENSITIVE Sensitive     NITROFURANTOIN <=16 SENSITIVE Sensitive     VANCOMYCIN 1 SENSITIVE Sensitive     LINEZOLID 2 SENSITIVE Sensitive     * 20,000 COLONIES/mL ENTEROCOCCUS FAECALIS  MRSA PCR Screening     Status:  None   Collection Time: 10/18/14  5:58 PM  Result Value Ref Range Status   MRSA by PCR NEGATIVE NEGATIVE Final    Comment:        The GeneXpert MRSA Assay (FDA approved for NASAL specimens only), is one component of a comprehensive MRSA colonization surveillance program. It is not intended to diagnose MRSA infection nor to guide or monitor treatment for MRSA infections.   Culture, blood (routine x 2)     Status: None   Collection Time: 10/20/14 11:10 AM  Result Value Ref Range Status   Specimen Description BLOOD RIGHT ARM  Final   Special Requests BOTTLES DRAWN AEROBIC AND ANAEROBIC 5CC  Final   Culture NO GROWTH 5 DAYS  Final   Report Status  10/25/2014 FINAL  Final  Culture, blood (routine x 2)     Status: None   Collection Time: 10/20/14 11:26 AM  Result Value Ref Range Status   Specimen Description BLOOD LEFT HAND  Final   Special Requests BOTTLES DRAWN AEROBIC AND ANAEROBIC 5CC  Final   Culture NO GROWTH 5 DAYS  Final   Report Status 10/25/2014 FINAL  Final    Anti-infectives    Start     Dose/Rate Route Frequency Ordered Stop   10/23/14 1200  cefTRIAXone (ROCEPHIN) 2 g in dextrose 5 % 50 mL IVPB     2 g 100 mL/hr over 30 Minutes Intravenous Every 12 hours 10/23/14 1157     10/22/14 1600  cefTRIAXone (ROCEPHIN) 2 g in dextrose 5 % 50 mL IVPB  Status:  Discontinued     2 g 100 mL/hr over 30 Minutes Intravenous Every 24 hours 10/22/14 1522 10/23/14 1157   10/22/14 1200  ampicillin (OMNIPEN) 2 g in sodium chloride 0.9 % 50 mL IVPB    Comments:  Pharmacy may adjust, please monitor patient after starting. thanks   2 g 150 mL/hr over 20 Minutes Intravenous Every 4 hours 10/22/14 1030     10/20/14 1800  gentamicin (GARAMYCIN) IVPB 60 mg  Status:  Discontinued     60 mg 100 mL/hr over 30 Minutes Intravenous Every 12 hours 10/20/14 1207 10/20/14 1550   10/20/14 1800  gentamicin (GARAMYCIN) IVPB 80 mg  Status:  Discontinued     80 mg 100 mL/hr over 30 Minutes Intravenous Every 12 hours 10/20/14 1550 10/22/14 1030   10/19/14 2200  vancomycin (VANCOCIN) IVPB 1000 mg/200 mL premix  Status:  Discontinued     1,000 mg 200 mL/hr over 60 Minutes Intravenous Every 12 hours 10/19/14 0844 10/22/14 1030   10/19/14 0900  doxycycline (VIBRAMYCIN) 100 mg in dextrose 5 % 250 mL IVPB  Status:  Discontinued     100 mg 125 mL/hr over 120 Minutes Intravenous 2 times daily 10/19/14 0805 10/19/14 1248   10/19/14 0900  vancomycin (VANCOCIN) 1,500 mg in sodium chloride 0.9 % 500 mL IVPB     1,500 mg 250 mL/hr over 120 Minutes Intravenous  Once 10/19/14 0820 10/19/14 1103   10/18/14 1700  doxycycline (VIBRAMYCIN) 100 mg in dextrose 5 % 250 mL IVPB   Status:  Discontinued     100 mg 125 mL/hr over 120 Minutes Intravenous Every 12 hours 10/18/14 1241 10/19/14 0803   10/18/14 1600  cefTRIAXone (ROCEPHIN) 2 g in dextrose 5 % 50 mL IVPB  Status:  Discontinued    Comments:  Aware of pcn allergy   2 g 100 mL/hr over 30 Minutes Intravenous Every 24 hours 10/18/14 1517 10/19/14 0803  10/18/14 1515  cefTRIAXone (ROCEPHIN) 1 g in dextrose 5 % 50 mL IVPB  Status:  Discontinued    Comments:  Aware of pcn allergy   1 g 100 mL/hr over 30 Minutes Intravenous Every 24 hours 10/18/14 1510 10/18/14 1516   10/18/14 1000  ciprofloxacin (CIPRO) IVPB 400 mg  Status:  Discontinued     400 mg 200 mL/hr over 60 Minutes Intravenous Every 12 hours 10/18/14 0949 10/18/14 1510   10/18/14 1000  metroNIDAZOLE (FLAGYL) IVPB 500 mg  Status:  Discontinued     500 mg 100 mL/hr over 60 Minutes Intravenous Every 8 hours 10/18/14 0949 10/19/14 1248   10/18/14 0415  doxycycline (VIBRAMYCIN) 100 mg in dextrose 5 % 250 mL IVPB     100 mg 125 mL/hr over 120 Minutes Intravenous  Once 10/18/14 0413 10/18/14 0715      Assessment: 64 yo male with 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 Plans are noted for likely AV replacement/MV repair and CABG next week.    8/28 Ampicllin>> 8/28 Rocephin>>  8/26 Blood >>neg 8/24 Urine cx > 20k enterococcus faecalis 8/24 Blood cx > enterococcus 2/2 (vanc MIC 1) 8/24 RMSF>> neg 8/24 B. Burgdorfi >> neg  Plan:  -Change rocephin to 2gm IV q24hr -No ampicillin dose changes needed -Will follow renal function, cultures and clinical progress  Hildred Laser, Pharm D 10/25/2014 11:37 AM

## 2014-10-25 NOTE — Progress Notes (Signed)
Bowel prep completed with no complications stool yellow liquid otherwise has been NPO.

## 2014-10-25 NOTE — Interval H&P Note (Signed)
History and Physical Interval Note:  10/25/2014 9:10 AM  Alan [name]  has presented today for surgery, with the diagnosis of Enterococcus sepsis  The various methods of treatment have been discussed with the patient and family. After consideration of risks, benefits and other options for treatment, the patient has consented to  Procedure(s): COLONOSCOPY (N/A) as a surgical intervention .  The patient's history has been reviewed, patient examined, no change in status, stable for surgery.  I have reviewed the patient's chart and labs.  Questions were answered to the patient's satisfaction.     Myranda Pavone M  Assessment:  1.  Enterococcal endocarditis.  Plan:  1.  Colonoscopy to exclude colonic lesions 2.  Risks (bleeding, infection, bowel perforation that could require surgery, sedation-related changes in cardiopulmonary systems), benefits (identification and possible treatment of source of symptoms, exclusion of certain causes of symptoms), and alternatives (watchful waiting, radiographic imaging studies, empiric medical treatment) of colonoscopy were explained to patient/family in detail and patient wishes to proceed.

## 2014-10-25 NOTE — Transfer of Care (Signed)
Immediate Anesthesia Transfer of Care Note  Patient: Alan [name]  Procedure(s) Performed: Procedure(s): COLONOSCOPY (N/A)  Patient Location: Endoscopy Unit  Anesthesia Type:MAC  Level of Consciousness: awake, alert  and oriented  Airway & Oxygen Therapy: Patient Spontanous Breathing and Patient connected to face mask oxygen  Post-op Assessment: Report given to RN and Post -op Vital signs reviewed and stable  Post vital signs: Reviewed and stable  Last Vitals:  Filed Vitals:   10/25/14 0817  BP: 138/65  Pulse: 91  Temp:   Resp: 10    Complications: No apparent anesthesia complications

## 2014-10-25 NOTE — Progress Notes (Signed)
Patient ID: Alan [name], male   DOB: 19-Apr-1950, 64 y.o.   MRN: [id number]         [facility]    Date of Admission:  10/17/2014   Total days of antibiotics 7        Day 4 ampicillin        Day 4 ceftriaxone         Principal Problem:   Enterococcal bacteremia Active Problems:   Aortic valve endocarditis - Enterococcus   Endocarditis of mitral valve - Enterococcus   Restless leg syndrome   Prostate cancer   Acute encephalopathy   Rash   Sepsis   Normocytic anemia   Dizziness   Acute respiratory failure   Paroxysmal atrial fibrillation   Paroxysmal atrial flutter   Atherosclerotic peripheral vascular disease   Aortic valve insufficiency, severe   Mitral valve regurgitation, infectious   Septic embolism   Leukocytosis   Troponin level elevated   Endocarditis   Splenic infarction   Protein-calorie malnutrition, severe   Coronary artery disease involving native coronary artery without angina pectoris   Stroke, embolic   . ampicillin (OMNIPEN) IV  2 g Intravenous Q4H  . aspirin  81 mg Oral Daily  . [START ON 10/26/2014] cefTRIAXone (ROCEPHIN)  IV  2 g Intravenous Q24H  . chlorhexidine  15 mL Mouth/Throat BID  . feeding supplement (ENSURE ENLIVE)  237 mL Oral TID BM  . pramipexole  1.5 mg Oral QHS    SUBJECTIVE: He is feeling better. He is glad to be able to eat again. He is feeling stronger.  Review of Systems: Pertinent items are noted in HPI.  Past Medical History  Diagnosis Date  . Restless leg syndrome   . Prostate cancer   . 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 Paroxysmal atrial fibrillation 10/19/2014  . Paroxysmal atrial flutter 10/19/2014  . Enterococcal bacteremia 10/19/2014  . 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 Protein-calorie  malnutrition, severe   . Septic embolism 10/20/2014    brain and spleen, subclinical  . Coronary artery disease involving native coronary artery without angina pectoris 10/23/2014  . Stroke, embolic 02/25/7508    Multiple small subclinical embolic infarcts noted on MRI c/w septic emboli  . Hepatitis     Social History  Substance Use Topics  . Smoking status: Former Research scientist (life sciences)  . Smokeless tobacco: None     Comment: Smoked a pipe age 15->28  . Alcohol Use: No     Comment: Drank from age 35->38    Family History  Problem Relation Age of Onset  . CAD Neg Hx    Allergies  Allergen Reactions  . Penicillins Swelling    Unknown reaction    OBJECTIVE: Filed Vitals:   10/25/14 0940 10/25/14 0950 10/25/14 1000 10/25/14 1050  BP: 82/55 94/57 104/79 128/85  Pulse: 74 72 77 91  Temp: 97.6 F (36.4 C)  97.8 F (36.6 C)   TempSrc: Oral  Oral   Resp: 22 18 23    Height:      Weight:      SpO2: 99% 100% 100% 93%   Body mass index is 22.69 kg/(m^2).  General: he is alert and appears comfortable  Skin: no rash Lungs: clear Cor: regular S1 and S2 with no murmurs Abdomen: soft and nontender with no palpable masses Joints and extremities:  Normal  Lab Results Lab Results  Component Value Date   WBC 8.4 10/24/2014   HGB 8.8* 10/24/2014   HCT 27.8* 10/24/2014   MCV 85.0 10/24/2014   PLT 344 10/24/2014    Lab Results  Component Value Date   CREATININE 1.04 10/25/2014   BUN 5* 10/25/2014   NA 135 10/25/2014   K 3.9 10/25/2014   CL 102 10/25/2014   CO2 27 10/25/2014    Lab Results  Component Value Date   ALT 23 10/25/2014   AST 24 10/25/2014   ALKPHOS 39 10/25/2014   BILITOT 0.4 10/25/2014     Microbiology: Recent Results (from the past 240 hour(s))  Blood Culture (routine x 2)     Status: None   Collection Time: 10/18/14  1:11 AM  Result Value Ref Range Status   Specimen Description BLOOD RIGHT ANTECUBITAL  Final   Special Requests BOTTLES DRAWN AEROBIC AND ANAEROBIC  10CC  Final   Culture  Setup Time   Final    GRAM POSITIVE COCCI IN PAIRS IN BOTH AEROBIC AND ANAEROBIC BOTTLES CRITICAL RESULT CALLED TO, READ BACK BY AND VERIFIED WITH: P CARBORNE 10/18/14 @ 70 M VESTAL    Culture ENTEROCOCCUS SPECIES  Final   Report Status 10/20/2014 FINAL  Final   Organism ID, Bacteria ENTEROCOCCUS SPECIES  Final      Susceptibility   Enterococcus species - MIC*    AMPICILLIN <=2 SENSITIVE Sensitive     VANCOMYCIN 1 SENSITIVE Sensitive     GENTAMICIN SYNERGY SENSITIVE Sensitive     LINEZOLID 2 SENSITIVE Sensitive     * ENTEROCOCCUS SPECIES  Blood Culture (routine x 2)     Status: None   Collection Time: 10/18/14  1:19 AM  Result Value Ref Range Status   Specimen Description BLOOD RIGHT WRIST  Final   Special Requests BOTTLES DRAWN AEROBIC AND ANAEROBIC 10CC  Final   Culture  Setup Time   Final    GRAM POSITIVE COCCI IN PAIRS IN BOTH AEROBIC AND ANAEROBIC BOTTLES CRITICAL RESULT CALLED TO, READ BACK BY AND VERIFIED WITH: P CARBORNE 10/18/14 @ 31 M VESTAL    Culture   Final    ENTEROCOCCUS SPECIES SUSCEPTIBILITIES PERFORMED ON PREVIOUS CULTURE WITHIN THE LAST 5 DAYS.    Report Status 10/20/2014 FINAL  Final  Urine culture     Status: None   Collection Time: 10/18/14  2:56 AM  Result Value Ref Range Status   Specimen Description URINE, CATHETERIZED  Final   Special Requests NONE  Final   Culture 20,000 COLONIES/mL ENTEROCOCCUS FAECALIS  Final   Report Status 10/20/2014 FINAL  Final   Organism ID, Bacteria ENTEROCOCCUS FAECALIS  Final      Susceptibility   Enterococcus faecalis - MIC*    AMPICILLIN <=2 SENSITIVE Sensitive     LEVOFLOXACIN 1 SENSITIVE Sensitive     NITROFURANTOIN <=16 SENSITIVE Sensitive     VANCOMYCIN 1 SENSITIVE Sensitive     LINEZOLID 2 SENSITIVE Sensitive     * 20,000 COLONIES/mL ENTEROCOCCUS FAECALIS  MRSA PCR Screening     Status: None   Collection Time: 10/18/14  5:58 PM  Result Value Ref Range Status   MRSA by PCR NEGATIVE  NEGATIVE Final    Comment:        The GeneXpert MRSA Assay (FDA approved for NASAL specimens only), is one component of a comprehensive MRSA colonization surveillance program. It is not intended to diagnose MRSA infection nor to guide or monitor treatment for MRSA  infections.   Culture, blood (routine x 2)     Status: None   Collection Time: 10/20/14 11:10 AM  Result Value Ref Range Status   Specimen Description BLOOD RIGHT ARM  Final   Special Requests BOTTLES DRAWN AEROBIC AND ANAEROBIC 5CC  Final   Culture NO GROWTH 5 DAYS  Final   Report Status 10/25/2014 FINAL  Final  Culture, blood (routine x 2)     Status: None   Collection Time: 10/20/14 11:26 AM  Result Value Ref Range Status   Specimen Description BLOOD LEFT HAND  Final   Special Requests BOTTLES DRAWN AEROBIC AND ANAEROBIC 5CC  Final   Culture NO GROWTH 5 DAYS  Final   Report Status 10/25/2014 FINAL  Final   MRI ABDOMEN WITHOUT AND WITH CONTRAST  TECHNIQUE: Multiplanar multisequence MR imaging of the abdomen was performed both before and after the administration of intravenous contrast.  CONTRAST: 94mL MULTIHANCE GADOBENATE DIMEGLUMINE 529 MG/ML IV SOLN  COMPARISON: Ultrasound 10/23/2014  FINDINGS: Lower chest: There is bibasilar atelectasis and small effusions.  Hepatobiliary: No focal hepatic lesion. No biliary duct dilatation. The gallbladder is normal. Common bile duct is normal.  Pancreas: Normal pancreatic parenchymal intensity. No ductal dilatation or inflammation.  Spleen: There is a wedge-shaped geographic region of nonenhancement within the central spleen extending from the hilum to the cortex measuring 8.6 by 5.8 by 6.4 cm consistent with a large midpole splenic infarction. No splenic hematoma or abscess identified.  Adrenals/urinary tract: Adrenal glands and kidneys are normal. There is a nonenhancing cysts within the cortex of the RIGHT kidney measuring 16 mm on series  1302.  Stomach/Bowel: Stomach and limited of the small bowel is unremarkable  Vascular/Lymphatic: Abdominal aorta normal caliber. No retroperitoneal periportal lymphadenopathy.  Musculoskeletal: No aggressive osseous lesion  IMPRESSION: 1. 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 2. No evidence of renal infection, pancreatitis, or hepatic abscess. 3. No cholelithiasis or biliary obstruction. 4. Bilateral pleural effusions and basilar atelectasis.   Electronically Signed  By: Suzy Bouchard M.D.  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 His infection is complicated by CNS and splenic emboli. Repeat blood cultures are negative and final. He is tolerating his current 2 drug regimen of ampicillin and ceftriaxone. Valve replacement/repair and CABG is planned for next week once he is stronger.  PLAN: 1. Continue ampicillin and ceftriaxone  Michel Bickers, MD [facility] for Infectious [place] [phone] pager   [phone] cell 10/25/2014, 1:40 PM

## 2014-10-25 NOTE — Progress Notes (Signed)
Patient ID: Alan [name], male   DOB: October 02, 1950, 64 y.o.   MRN: [id number] [facility] PROGRESS NOTE  Alan [name] GEX:[id number] DOB: September 01, 1950 DOA: 10/17/2014 PCP: [name], MD  Subjective:   Seen after he had colonoscopy, denies any complaints. Per report colonoscopy showed only scattered diverticula, no source for enterococcal endocarditis identified. CTS plans noted for probable CABG and aortic valve replacement sometime next week.  Brief narrative:    64 y.o. male with past medical history of prostate cancer, RLS who presented to [facility] with worsening symptoms of dizziness and gait instability. He had some mental status changes and was lethargic prior to admission for which reason his wife brought him in to ED. Pt has been working in a yard a lot and per wife she was concerned that he may have had a tick bit.  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 Patient has been seen by ID and cardiology in consultation. He is showing an improvement although slow on therapy with Omnipen and Rocephin.  Barrier to discharge: He will have aortic valve replacement and probable CABG next week.  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 Urine culture also grew enterococcus species. Repeat blood cultures on 10/20/2014 shows no growth to date. - MRI brain on 10/20/2014 showed scattered small acute to subacute and occasional early chronic, lacunar infarcts in both MCA territories and the left cerebellum. consider sequelae of endocarditis and septic emboli in this setting.  - Pt is status post TEE which demonstrated mild MR with vegetation on atrial surface of anterior mitral leaflet as well as severe AR with large mobile vegetation on the  non coronary cusp. ID was consulted and pt initially on vanco, gentamycin but currently on Omnipen and rocephin. So far tolerating this therapy well with no signs of adverse reaction.  - Plan is for cardiac cath today in anticipation of valve surgery. - Continue to monitor in SDU due to acuity of medical issues at present.  - Of note, studies such as UDS, HIV Ab, RPR all normal. - Continue Rocephin and ampicillin -Per CTS recommendation aortic valve replacement with CABG next week.  Active Problems: Paroxysmal atrial fibrillation /Atrial flutter - CHADS vasc score at least 3 (Age, history of CVA) - So far rate relatively controlled - Appreciate cardiology following   Troponin elevation - Likely demand ischemia from acute septic emboli and bacteremia - Plan for cardiac cath today - Continue daily aspirin  History of  CVA  - As evidenced on MRI brain, pt has chronic anterior division left MCA infarct and a small number of chronic micro hemorrhages are seen in both hemispheres in the left cerebellum - Continue daily aspirin - Appreciate physical therapy evaluation   Diarrhea - GI pathogen panel negative  Restless leg syndrome - Continue Pramipexole  Normocytic anemia - Hemoglobin stable at 10 - No current indications for transfusion - Plan for colonoscopy tomorrow   Dizziness - Likely combination of previous stroke and septic emboli - Appreciate PT evaluation   Splenic lesion - Questionable splenic infarct seen on abd [place] but need MRI for better evaluation. MRI pending.    DVT Prophylaxis  - SCD's bilaterally    Code Status: Full.  Family Communication:  plan of care discussed with the patient Disposition Plan:   IV access:  Peripheral IV  Procedures and diagnostic studies:    Dg Orthopantogram 10/21/2014 1. No fracture or bone lesion. No evidence of a dental abscess or untreated carie.   Electronically Signed   By: Lajean Manes M.D.   On: 10/21/2014 10:09   Mr  [name] Head Wo Contrast 10/20/2014    1. Scattered small acute to subacute, and occasional early chronic, lacunar infarcts in both MCA territories and the left cerebellum. None of these appear hemorrhagic (cerebral septic emboli are prone to bleed), but consider sequelae of endocarditis and septic emboli in this setting. No significant edema or mass effect. 2. Chronic anterior division left MCA infarct. A small number of chronic micro hemorrhages are identified in both hemispheres an the left cerebellum. 3. Mild for age intracranial atherosclerosis with no stenosis or branch occlusion identified. Mild intracranial artery dolichoectasia.   Electronically Signed   By: Genevie Ann M.D.   On: 10/20/2014 09:49   Mr [name] Contrast 10/20/2014  Scattered small acute to subacute, and occasional early chronic, lacunar infarcts in both MCA territories and the left cerebellum. None of these appear hemorrhagic (cerebral septic emboli are prone to bleed), but consider sequelae of endocarditis and septic emboli in this setting. No significant edema or mass effect. 2. Chronic anterior division left MCA infarct. A small number of chronic micro hemorrhages are identified in both hemispheres an the left cerebellum. 3. Mild for age intracranial atherosclerosis with no stenosis or branch occlusion identified. Mild intracranial artery dolichoectasia.   Electronically Signed   By: Genevie Ann M.D.   On: 10/20/2014 09:49   US Abdomen Complete 10/23/2014   Large irregular hypoechoic lesion within the spleen which is incompletely characterized on ultrasound. While splenic infarct is not excluded, this is favored to represent a mass lesion. Given the size of the lesion and no priors for comparison, when patient clinically able, recommend evaluation with MRI.  Mild splenomegaly.  Increased renal cortical echogenicity as can be seen with chronic medical renal disease.  Bilateral pleural effusions.  These results will be called to the ordering  clinician or representative by the Radiologist Assistant, and communication documented in the PACS or zVision Dashboard.   Electronically Signed   By: Lovey Newcomer M.D.   On: 10/23/2014 11:25   Dg Chest 2 View 10/18/2014 No active cardiopulmonary disease.   Electronically Signed   By: Lucienne Capers M.D.   On: 10/18/2014 00:49   Cardiac catheterization 10/23/2014   Medical Consultants:  Cardiology [facility] (Dr. Arta Silence) Infectious disease  Other Consultants:  Physical therapy  IAnti-Infectives:         Birdie Hopes, MD  [facility] Pager [phone]  Time spent in minutes: 25 minutes  If 7PM-7AM, please contact night-coverage [email or web address] Password [email or web address] 10/25/2014, 12:42 PM   LOS: 7 days     Objective: Filed Vitals:   10/25/14 0817 10/25/14 0940 10/25/14 0950 10/25/14 1000  BP: 138/65 82/55 94/57  104/79  Pulse: 91 74 72 77  Temp:  97.6 F (36.4 C)  97.8 F (36.6 C)  TempSrc: Oral Oral  Oral  Resp: 10 22 18 23   Height:      Weight:      SpO2: 93% 99% 100% 100%    Intake/Output Summary (Last 24 hours) at 10/25/14 1242 Last data filed at 10/25/14 1042  Gross per 24 hour  Intake    818 ml  Output   1600 ml  Net   -782 ml    Exam:   General:  Pt is alert, follows commands appropriately, not in acute distress  Cardiovascular: tachycardic, S1/S2 appreciated   Respiratory: Clear to auscultation bilaterally, no wheezing, no crackles, no rhonchi  Abdomen: Soft, non tender, non distended, bowel sounds present  Extremities: No edema, pulses DP and PT palpable bilaterally  Neuro: Grossly nonfocal  Data Reviewed: Basic Metabolic Panel:  Recent Labs Lab 10/21/14 0236 10/21/14 2048 10/23/14 0130 10/24/14 0150 10/25/14 0147  NA 134* 133* 131* 130* 135  K 4.3 4.3 4.6 4.0 3.9  CL 103 100* 98* 98* 102  CO2 26 28 28 25 27   GLUCOSE 109* 119* 125* 104* 102*  BUN 5* 6 <5* 6 5*  CREATININE 0.94 1.08 1.00 0.97 1.04  CALCIUM 8.4* 8.5* 8.6*  8.3* 8.6*  MG  --  1.7  --   --   --   PHOS  --  3.4  --   --   --    Liver Function Tests:  Recent Labs Lab 10/21/14 2048 10/25/14 0147  AST 49* 24  ALT 28 23  ALKPHOS 85 39  BILITOT 0.5 0.4  PROT 5.7* 6.0*  ALBUMIN 1.9* 1.8*   No results for input(s): LIPASE, AMYLASE in the last 168 hours. No results for input(s): AMMONIA in the last 168 hours. CBC:  Recent Labs Lab 10/19/14 0310 10/21/14 0236 10/21/14 2048 10/23/14 0130 10/24/14 0150  WBC 6.3 11.6* 14.3* 15.3* 8.4  NEUTROABS  --   --  10.7*  --   --   HGB 8.8* 10.0* 10.3* 10.0* 8.8*  HCT 27.2* 30.9* 31.0* 30.5* 27.8*  MCV 84.2 83.1 84.0 84.5 85.0  PLT 212 302 294 324 344   Cardiac Enzymes:  Recent Labs Lab 10/24/14 0820 10/24/14 1454 10/24/14 2000 10/25/14 0147 10/25/14 0800  TROPONINI 0.07* 0.07* 0.07* 0.06* 0.06*   BNP: Invalid input(s): POCBNP CBG:  Recent Labs Lab 10/21/14 0750 10/22/14 0806 10/23/14 0823 10/24/14 0623 10/25/14 0642  GLUCAP 100* 108* 108* 98 103*    Blood Culture (routine x 2)     Status: None   Collection Time: 10/18/14  1:11 AM  Result Value Ref Range Status   Specimen Description BLOOD RIGHT ANTECUBITAL  Final   Report Status 10/20/2014 FINAL  Final   Organism ID, Bacteria ENTEROCOCCUS SPECIES  Final      Susceptibility   Enterococcus species - MIC*    AMPICILLIN <=2 SENSITIVE Sensitive     VANCOMYCIN 1 SENSITIVE Sensitive     GENTAMICIN SYNERGY SENSITIVE Sensitive     LINEZOLID 2 SENSITIVE Sensitive     * ENTEROCOCCUS SPECIES  Blood Culture (routine x 2)     Status: None   Collection Time: 10/18/14  1:19 AM  Result Value Ref Range Status   Specimen Description BLOOD RIGHT WRIST  Final    ENTEROCOCCUS SPECIES    Report Status 10/20/2014 FINAL  Final  Urine culture     Status: None   Collection Time: 10/18/14  2:56 AM  Result Value Ref Range Status   Specimen Description URINE, CATHETERIZED  Final   Special Requests NONE  Final   Report Status 10/20/2014  FINAL  Final   Organism ID, Bacteria ENTEROCOCCUS FAECALIS  Final      Susceptibility   Enterococcus faecalis - MIC*    AMPICILLIN <=2 SENSITIVE Sensitive     LEVOFLOXACIN 1 SENSITIVE Sensitive     NITROFURANTOIN <=16 SENSITIVE Sensitive     VANCOMYCIN 1 SENSITIVE Sensitive     LINEZOLID 2 SENSITIVE Sensitive     *  20,000 COLONIES/mL ENTEROCOCCUS FAECALIS  MRSA PCR Screening     Status: None   Collection Time: 10/18/14  5:58 PM  Result Value Ref Range Status   MRSA by PCR NEGATIVE NEGATIVE Final  Culture, blood (routine x 2)     Status: None (Preliminary result)   Collection Time: 10/20/14 11:10 AM  Result Value Ref Range Status   Specimen Description BLOOD RIGHT ARM  Final   Special Requests BOTTLES DRAWN AEROBIC AND ANAEROBIC 5CC  Final   Culture NO GROWTH 3 DAYS  Final   Report Status PENDING  Incomplete  Culture, blood (routine x 2)     Status: None (Preliminary result)   Collection Time: 10/20/14 11:26 AM  Result Value Ref Range Status   Specimen Description BLOOD LEFT HAND  Final   Special Requests BOTTLES DRAWN AEROBIC AND ANAEROBIC 5CC  Final   Culture NO GROWTH 3 DAYS  Final   Report Status PENDING  Incomplete     Scheduled Meds: . ampicillin (OMNIPEN) IV  2 g Intravenous Q4H  . aspirin  81 mg Oral Daily  . [START ON 10/26/2014] cefTRIAXone (ROCEPHIN)  IV  2 g Intravenous Q24H  . chlorhexidine  15 mL Mouth/Throat BID  . feeding supplement (ENSURE ENLIVE)  237 mL Oral TID BM  . pramipexole  1.5 mg Oral QHS   Continuous Infusions: . sodium chloride 20 mL/hr at 10/24/14 0700  . lactated ringers

## 2014-10-25 NOTE — Progress Notes (Signed)
Nutrition Follow-up  DOCUMENTATION CODES:   Severe malnutrition in context of acute illness/injury  INTERVENTION:   Consider liberalizing diet to Regular if PO does not improve.   Ensure Enlive po TID, each supplement provides 350 kcal and 20 grams of protein (pt prefers chocolate)   NUTRITION DIAGNOSIS:   Malnutrition related to acute illness as evidenced by severe depletion of body fat, severe depletion of muscle mass, 13 percent weight loss in the last 6-7 weeks, energy intake < or equal to 50% for > or equal to 5 days.  ongoing  GOAL:   Patient will meet greater than or equal to 90% of their needs  Not met.   MONITOR:   PO intake, Supplement acceptance, Labs, Weight trends  REASON FOR ASSESSMENT:   Consult  (malnutrition, recommend supplements)  ASSESSMENT:   64 y.o. Male with PMH of prostate cancer, RLS, who presents result in mental status, dizziness and a rash.  Pt reports that he has lost 25 lb or 13% of his body weigh in the last 6-7 weeks. He has had absolutely no appetite and food disgusted him. He was not eating meals but did have a few snacks here and there during this time. He has been weak and small tasks exhaust him.  Pt attempting to eat lunch as he knows he needs nutrition prior to planned CABG. Pt does not care for the food as he does not follow a low sodium diet at home usually.  Nutrition-Focused physical exam completed. Findings are severe fat depletion, severe muscle depletion, and mild edema.   Pt with enterococcal endocarditis (sepsis) which is what pt attributes his weight loss to.  t had colonoscopy this am to rule out colonic lesions.   Prealbumin 4.6  Diet Order:  Diet Heart Room service appropriate?: Yes; Fluid consistency:: Thin  Skin:  Reviewed, no issues  Last BM:  8/30 s/p bowel prep  Height:   Ht Readings from Last 1 Encounters:  10/23/14 6' (1.829 m)    Weight:   Wt Readings from Last 1 Encounters:  10/25/14 167 lb 5.3  oz (75.9 kg)    Ideal Body Weight:  81 kg  BMI:  Body mass index is 22.69 kg/(m^2).  Estimated Nutritional Needs:   Kcal:  2100-2300  Protein:  110-125 grams  Fluid:  > 2.1 L/day  EDUCATION NEEDS:   No education needs identified at this time  [place], [place], [place] Pager [phone] After Hours Pager

## 2014-10-25 NOTE — Progress Notes (Signed)
OT Cancellation Note  Patient Details Name: Alan [name] MRN: [id number] DOB: 07/25/1950   Cancelled Treatment:    Reason Eval/Treat Not Completed: Patient at procedure or test/ unavailable (colonscopy currently - started at [place] per RN) OT to continue to monitor and check back for the most appropriate time to tx patient.  Vonita Moss   OTR/L Pager: [phone] Office: [phone] .  10/25/2014, 8:59 AM

## 2014-10-25 NOTE — H&P (View-Only) (Signed)
[address]       [place] [address]             [phone]     CARDIOTHORACIC SURGERY PROGRESS NOTE  1 Day Post-Op  S/P Procedure(s) (LRB): Right/Left Heart Cath and Coronary Angiography (N/A)  Subjective: Feels extremely weak.  Patient was unable to lift the jug of bowel prep by himself to poor a cup.  Appetite has finally started to improve, but he hasn't been able to eat anything other than clear liquids since Sunday due to bowel prep.  No SOB.  Objective: Vital signs in last 24 hours: Temp:  [98.1 F (36.7 C)-99.1 F (37.3 C)] 98.2 F (36.8 C) (08/30 1646) Pulse Rate:  [85-97] 93 (08/30 1646) Cardiac Rhythm:  [-] Normal sinus rhythm (08/30 0800) Resp:  [14-24] 18 (08/30 1646) BP: (85-122)/(37-71) 120/68 mmHg (08/30 1646) SpO2:  [92 %-94 %] 93 % (08/30 1646) Weight:  [76.4 kg (168 lb 6.9 oz)] 76.4 kg (168 lb 6.9 oz) (08/30 0330)  Physical Exam:  Rhythm:   sinus  Breath sounds: clear  Heart sounds:  RRR w/ diastolic murmur  Incisions:  n/a  Abdomen:  soft  Extremities:  Warm, + edema   Intake/Output from previous day: 08/29 0701 - 08/30 0700 In: 1226.6 [I.V.:1026.6; IV Piggyback:200] Out: 1100 [Urine:1100] Intake/Output this shift:    Lab Results:  Recent Labs  10/23/14 0130 10/24/14 0150  WBC 15.3* 8.4  HGB 10.0* 8.8*  HCT 30.5* 27.8*  PLT 324 344   BMET:  Recent Labs  10/23/14 0130 10/24/14 0150  NA 131* 130*  K 4.6 4.0  CL 98* 98*  CO2 28 25  GLUCOSE 125* 104*  BUN <5* 6  CREATININE 1.00 0.97  CALCIUM 8.6* 8.3*    CBG (last 3)   Recent Labs  10/22/14 0806 10/23/14 0823 10/24/14 0623  GLUCAP 108* 108* 98   PT/INR:  No results for input(s): LABPROT, INR in the last 72 hours.  CXR:  N/A    CARDIAC CATHETERIZATION  Procedures    Right/Left Heart Cath and Coronary Angiography    PACS Images    Show images for Cardiac catheterization     Link to Procedure Log    Procedure Log      Indications     Bacterial endocarditis [I33.0 (ICD-10-CM)]    Technique and Indications    Estimated blood loss <50 mL. There were no immediate complications during the procedure.    Conclusion     Ost LAD lesion, 60% stenosed.  Prox LAD to Mid LAD lesion, 70% stenosed.  Ost LPDA to LPDA lesion, 90% stenosed.  Ost 2nd Mrg to 2nd Mrg lesion, 70% stenosed.  2nd Mrg lesion, 50% stenosed.  JARAMIE BASTOS is a 64 y.o. male   [id number] LOCATION: FACILITY: [facility]  PHYSICIAN: Quay Burow, M.D. [birth year]   DATE OF PROCEDURE: 10/23/2014  DATE OF DISCHARGE:     CARDIAC CATHETERIZATION     History obtained from chart review. 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 He has severe aortic insufficiency. He presents now for right and left heart catheter to define his coronary anatomy and physiology     IMPRESSION:Mr. Senseney has disease in his LAD and circumflex coronary arteries. He has a left dominant system. His aortic valve was not crossed. His filling pressures are unremarkable. He has a relatively high cardiac output. He will probably need coronary artery bypass grafting  at the time of aortic valve replacement. The sheaths were removed and pressure held on the groin to achieve hemostasis. The patient left the cardiac cath lab in stable condition.  Quay Burow. MD, [facility] 10/23/2014 2:57 PM        Coronary Findings    Dominance: Left   Left Anterior Descending   . Ost LAD lesion, 60% stenosed.   . Prox LAD to Mid LAD lesion, 70% stenosed.     Left Circumflex   . Second [employer]   . Ost 2nd Mrg to 2nd Mrg lesion, 70% stenosed.   . 2nd Mrg lesion, 50% stenosed.   . Left Posterior Descending Artery   . Ost LPDA to LPDA lesion, 90% stenosed.       Right Heart Pressures Right atrial pressure: 8/6 Right ventricular pressure: 93/2, end-diastolic pressure 7 Pulmonary artery  pressure: 30/10, mean equals 19 Pulmonary pulmonary Wedge pressure: A wave 13, V wave 9, mean 8 Cardiac output: 7.6 L/m    Coronary Diagrams    Diagnostic Diagram            Implants    Name ID Temporary Type Supply   No information to display    Hemo Data       Most Recent Value   Thermal Cardiac Output  7.58 L/min   Thermal Cardiac Output Index  3.81 (L/min)/BSA   RA A Wave  8 mmHg   RA V Wave  6 mmHg   RA Mean  5 mmHg   RV Systolic Pressure  30 mmHg   RV Diastolic Pressure  1 mmHg   RV EDP  7 mmHg   PA Systolic Pressure  30 mmHg   PA Diastolic Pressure  10 mmHg   PA Mean  19 mmHg   PW A Wave  13 mmHg   PW V Wave  9 mmHg   PW Mean  8 mmHg   AO Systolic Pressure  91 mmHg   AO Diastolic Pressure  51 mmHg   AO Mean  66 mmHg   TPVR Index  4.99 HRUI   TSVR Index  17.32 HRUI   PVR SVR Ratio  0.18   TPVR/TSVR Ratio  0.29         MRI ABDOMEN WITHOUT AND WITH CONTRAST  TECHNIQUE: Multiplanar multisequence MR imaging of the abdomen was performed both before and after the administration of intravenous contrast.  CONTRAST: 62mL MULTIHANCE GADOBENATE DIMEGLUMINE 529 MG/ML IV SOLN  COMPARISON: Ultrasound 10/23/2014  FINDINGS: Lower chest: There is bibasilar atelectasis and small effusions.  Hepatobiliary: No focal hepatic lesion. No biliary duct dilatation. The gallbladder is normal. Common bile duct is normal.  Pancreas: Normal pancreatic parenchymal intensity. No ductal dilatation or inflammation.  Spleen: There is a wedge-shaped geographic region of nonenhancement within the central spleen extending from the hilum to the cortex measuring 8.6 by 5.8 by 6.4 cm consistent with a large midpole splenic infarction. No splenic hematoma or abscess identified.  Adrenals/urinary tract: Adrenal glands and kidneys are normal. There is a nonenhancing cysts within the cortex of the RIGHT kidney measuring 16 mm on series  1302.  Stomach/Bowel: Stomach and limited of the small bowel is unremarkable  Vascular/Lymphatic: Abdominal aorta normal caliber. No retroperitoneal periportal lymphadenopathy.  Musculoskeletal: No aggressive osseous lesion  IMPRESSION: 1. 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 2. No evidence of renal infection, pancreatitis, or hepatic abscess. 3. No cholelithiasis or biliary obstruction. 4. Bilateral pleural effusions and basilar atelectasis.   Electronically  Signed  By: Suzy Bouchard M.D.  On: 10/24/2014 18:37    Assessment/Plan:  Patient remains clinically stable w/ no fevers or other signs of uncontrolled infection, no signs of CHF, nor the development of AV block.  Follow up blood cultures on antibiotics remain no growth so far.  Results of cath and MRI of abdomen discussed with patient.  Await results of colonoscopy.  I have discussed the relative indications for surgery with Mr Slayton and his family.  They understand that he will likely need aortic valve replacement rather than repair, but I remain hopeful that his mitral valve will be repairable.  We will also plan CABG at the time of surgery. Options regarding the timing of surgery have been discussed.  Because he remains clinically stable but extremely weak and malnourished, I think it may be best to plan for surgery some time next week rather than proceeding to OR later this week.  The only down side to waiting a few extra days is the relatively small risk of repeat embolization during the interim period of time.  He understands and agrees with the plan.  I spent in excess of 30 minutes during the conduct of this hospital encounter and >50% of this time involved direct face-to-face encounter with the patient for counseling and/or coordination of their care.   Rexene Alberts 10/24/2014 7:19 PM

## 2014-10-25 NOTE — Progress Notes (Signed)
Physical Therapy Treatment Patient Details Name: Alan [name] MRN: [id number] DOB: [birth year] Today's Date: 10/25/2014    History of Present Illness 64 y.o. male with PMH of prostate cancer, RLS, who resents result in mental status, dizziness and rashes. Patient has been having dizziness and poor balance in the past 2-3 weeks. No unilateral weakness, vision change or hearing loss. Patient recently did a lot of yard work in [place], such as cutting trees. He could have had insect bite per his wife. Patient became confused and lethargic, and was found asleep by security guard at work (works at [employer]) at about 9:30 PM. He was also found to have fever with temperature 101.2 and petechial rashes over bilateral legs. Currently presents with acute encephalopathy.    PT Comments    Agreeable to amb despite earlier procedure; Noting normal HR and O2 sat responses to activiyt especially since he still likely had some sedation in his system; RN states she will monitor for possible need to re-start supplemental O2   Follow Up Recommendations  Supervision for mobility/OOB;Outpatient PT (Outpt PT for balance and for UE strengthening and ROM)     Equipment Recommendations  None recommended by PT    Recommendations for Other Services       Precautions / Restrictions Precautions Precautions: Fall    Mobility  Bed Mobility Overal bed mobility: Needs Assistance Bed Mobility: Supine to Sit     Supine to sit: Min assist;HOB elevated     General bed mobility comments: min handheld assist to pull to sit  Transfers Overall transfer level: Needs assistance Equipment used: None;1 person hand held assist Transfers: Sit to/from Stand Sit to Stand: Min assist         General transfer comment: Min assist to steady today, especially given colonoscopy earlier  Ambulation/Gait Ambulation/Gait assistance: Min assist Ambulation Distance (Feet): 150 Feet Assistive device: 1  person hand held assist Gait Pattern/deviations: Step-through pattern;Scissoring   Gait velocity interpretation: Below normal speed for age/gender General Gait Details: Min assist for steadiness, especially given colonoscopy earlier; Good self-monitor -- he stated he is more unsteady than he anticipated; walked on room air   Stairs            Wheelchair Mobility    Modified Rankin (Stroke Patients Only)       Balance             Standing balance-Leahy Scale: Fair Standing balance comment: Balance no tas good due to getting sedation from colonoscopy out of his system                    Cognition Arousal/Alertness: Awake/alert Behavior During Therapy: WFL for tasks assessed/performed Overall Cognitive Status: Within Functional Limits for tasks assessed                 General Comments: post colonoscopy    Exercises      General Comments        Pertinent Vitals/Pain Pain Assessment: No/denies pain    Home Living                      Prior Function            PT Goals (current goals can now be found in the care plan section) Acute Rehab PT Goals 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 [employer] PT Goal Formulation: With patient Time For Goal Achievement: 11/03/14 Potential to Achieve  Goals: Good Progress towards PT goals: Progressing toward goals    Frequency  Min 3X/week    PT Plan Current plan remains appropriate    Co-evaluation             End of Session Equipment Utilized During Treatment: Gait belt Activity Tolerance: Patient limited by fatigue Patient left: in bed;with call bell/phone within reach     Time: 8550-1586 PT Time Calculation (min) (ACUTE ONLY): 18 min  Charges:  $Gait Training: 8-22 mins                    G Codes:      Quin Hoop 10/25/2014, 4:21 PM  Roney Marion, [place] Pager [phone] Office [phone]

## 2014-10-25 NOTE — Progress Notes (Signed)
PT Cancellation Note  Patient Details Name: Alan [name] MRN: [id number] DOB: 1950/11/18   Cancelled Treatment:    Reason Eval/Treat Not Completed: Other (comment)   Colonoscopy this am;   Will follow up later today as time allows;  Otherwise, will follow up for PT tomorrow;   Thank you,  Roney Marion, [place] Pager [phone] Office [phone]     Roney Marion [facility] 10/25/2014, 11:24 AM

## 2014-10-25 NOTE — Anesthesia Postprocedure Evaluation (Signed)
  Anesthesia Post-op Note  Patient: Alan [name]  Procedure(s) Performed: Procedure(s): COLONOSCOPY (N/A)  Patient Location: PACU  Anesthesia Type: MAC  Level of Consciousness: awake and alert   Airway and Oxygen Therapy: Patient Spontanous Breathing  Post-op Pain: Controlled  Post-op Assessment: Post-op Vital signs reviewed, Patient's Cardiovascular Status Stable and Respiratory Function Stable  Post-op Vital Signs: Reviewed  Filed Vitals:   10/25/14 1000  BP: 104/79  Pulse: 77  Temp:   Resp: 23    Complications: No apparent anesthesia complications

## 2014-10-26 ENCOUNTER — Encounter (HOSPITAL_COMMUNITY): Payer: Medicare Other

## 2014-10-26 DIAGNOSIS — Z5181 Encounter for therapeutic drug level monitoring: Secondary | ICD-10-CM

## 2014-10-26 DIAGNOSIS — I339 Acute and subacute endocarditis, unspecified: Secondary | ICD-10-CM | POA: Diagnosis present

## 2014-10-26 DIAGNOSIS — E43 Unspecified severe protein-calorie malnutrition: Secondary | ICD-10-CM

## 2014-10-26 DIAGNOSIS — I639 Cerebral infarction, unspecified: Secondary | ICD-10-CM

## 2014-10-26 LAB — GLUCOSE, CAPILLARY: GLUCOSE-CAPILLARY: 119 mg/dL — AB (ref 65–99)

## 2014-10-26 LAB — TROPONIN I: Troponin I: 0.07 ng/mL — ABNORMAL HIGH (ref <0.031)

## 2014-10-26 NOTE — Progress Notes (Signed)
Patient ID: Alan [name], male   DOB: 1950/04/20, 64 y.o.   MRN: [id number] [facility] PROGRESS NOTE  Alan [name] SWH:[id number] DOB: 28-Apr-1950 DOA: 10/17/2014 PCP: [name], MD  Subjective:   No new complaints, sitting in bedside reading from tablet. Or earlier with cardiac rehabilitation, per their note patient is still weak and slightly unsteady. CABG and aortic valve replacement to be done next week.  Brief narrative:    64 y.o. male with past medical history of prostate cancer, RLS who presented to [facility] with worsening symptoms of dizziness and gait instability. He had some mental status changes and was lethargic prior to admission for which reason his wife brought him in to ED. Pt has been working in a yard a lot and per wife she was concerned that he may have had a tick bit.  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 Patient has been seen by ID and cardiology in consultation. He is showing an improvement although slow on therapy with Omnipen and Rocephin.  Barrier to discharge: He will have aortic valve replacement and probable CABG next week.  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 Urine culture also grew enterococcus species. Repeat blood cultures on 10/20/2014 shows no growth to date. - MRI brain on 10/20/2014 showed scattered small acute to subacute and occasional early chronic, lacunar infarcts in both MCA territories and the left cerebellum. consider sequelae of endocarditis and septic emboli in this setting.  - Pt is status post TEE which demonstrated mild MR with vegetation on atrial surface of anterior mitral leaflet as well as severe AR with large mobile vegetation on the non coronary cusp. ID was  consulted and pt initially on vanco, gentamycin but currently on Omnipen and rocephin. So far tolerating this therapy well with no signs of adverse reaction.  - Plan is for cardiac cath today in anticipation of valve surgery. - Continue to monitor in SDU due to acuity of medical issues at present.  - Of note, studies such as UDS, HIV Ab, RPR all normal. - Continue Rocephin and ampicillin -Per CTS recommendation aortic valve replacement with CABG next week.  Active Problems: Paroxysmal atrial fibrillation /Atrial flutter - CHADS vasc score at least 3 (Age, history of CVA) - So far rate relatively controlled - Appreciate cardiology following   Troponin elevation - Likely demand ischemia from acute septic emboli and bacteremia - Plan for cardiac cath today - Continue daily aspirin  History of  CVA  - As evidenced on MRI brain, pt has chronic anterior division left MCA infarct and a small number of chronic micro hemorrhages are seen in both hemispheres in the left cerebellum - Continue daily aspirin - Appreciate physical therapy evaluation   Diarrhea - GI pathogen panel negative  Restless leg syndrome - Continue Pramipexole  Normocytic anemia - Hemoglobin stable at 10 - No current indications for transfusion - Plan for colonoscopy tomorrow   Dizziness - Likely combination of previous stroke and septic emboli - Appreciate PT evaluation   Splenic lesion - Questionable splenic infarct seen on abd [place] but need MRI for better evaluation. MRI pending.    DVT Prophylaxis  - SCD's bilaterally    Code Status: Full.  Family Communication:  plan of care discussed with the patient Disposition Plan:   IV access:  Peripheral IV  Procedures and diagnostic studies:    Dg Orthopantogram 10/21/2014 1. No fracture or bone lesion. No evidence of a dental abscess or untreated carie.   Electronically Signed   By: Lajean Manes M.D.   On: 10/21/2014 10:09   [name] Head Wo Contrast  10/20/2014    1. Scattered small acute to subacute, and occasional early chronic, lacunar infarcts in both MCA territories and the left cerebellum. None of these appear hemorrhagic (cerebral septic emboli are prone to bleed), but consider sequelae of endocarditis and septic emboli in this setting. No significant edema or mass effect. 2. Chronic anterior division left MCA infarct. A small number of chronic micro hemorrhages are identified in both hemispheres an the left cerebellum. 3. Mild for age intracranial atherosclerosis with no stenosis or branch occlusion identified. Mild intracranial artery dolichoectasia.   Electronically Signed   By: Genevie Ann M.D.   On: 10/20/2014 09:49   Mr [name] Contrast 10/20/2014  Scattered small acute to subacute, and occasional early chronic, lacunar infarcts in both MCA territories and the left cerebellum. None of these appear hemorrhagic (cerebral septic emboli are prone to bleed), but consider sequelae of endocarditis and septic emboli in this setting. No significant edema or mass effect. 2. Chronic anterior division left MCA infarct. A small number of chronic micro hemorrhages are identified in both hemispheres an the left cerebellum. 3. Mild for age intracranial atherosclerosis with no stenosis or branch occlusion identified. Mild intracranial artery dolichoectasia.   Electronically Signed   By: Genevie Ann M.D.   On: 10/20/2014 09:49   US Abdomen Complete 10/23/2014   Large irregular hypoechoic lesion within the spleen which is incompletely characterized on ultrasound. While splenic infarct is not excluded, this is favored to represent a mass lesion. Given the size of the lesion and no priors for comparison, when patient clinically able, recommend evaluation with MRI.  Mild splenomegaly.  Increased renal cortical echogenicity as can be seen with chronic medical renal disease.  Bilateral pleural effusions.  These results will be called to the ordering clinician or representative  by the Radiologist Assistant, and communication documented in the PACS or zVision Dashboard.   Electronically Signed   By: Lovey Newcomer M.D.   On: 10/23/2014 11:25   Dg Chest 2 View 10/18/2014 No active cardiopulmonary disease.   Electronically Signed   By: Lucienne Capers M.D.   On: 10/18/2014 00:49   Cardiac catheterization 10/23/2014   Medical Consultants:  Cardiology [facility] (Dr. Arta Silence) Infectious disease  Other Consultants:  Physical therapy  IAnti-Infectives:         Alan Hopes, MD  [facility] Pager [phone]  Time spent in minutes: 25 minutes  If 7PM-7AM, please contact night-coverage [email or web address] Password [email or web address] 10/26/2014, 1:16 PM   LOS: 8 days     Objective: Filed Vitals:   10/25/14 1613 10/25/14 2020 10/26/14 0314 10/26/14 0907  BP: 132/73 138/77 142/82 110/61  Pulse: 93 92 98 93  Temp: 98.3 F (36.8 C) 99.2 F (37.3 C) 99.6 F (37.6 C) 99.1 F (37.3 C)  TempSrc: Oral Oral Oral Oral  Resp: 20  20 20   Height:      Weight:      SpO2: 96% 95% 95% 94%    Intake/Output Summary (Last 24 hours) at 10/26/14 1316 Last data filed at 10/26/14 0330  Gross per 24 hour  Intake     50 ml  Output   1000 ml  Net   -950 ml  Exam:   General:  Pt is alert, follows commands appropriately, not in acute distress  Cardiovascular: tachycardic, S1/S2 appreciated   Respiratory: Clear to auscultation bilaterally, no wheezing, no crackles, no rhonchi  Abdomen: Soft, non tender, non distended, bowel sounds present  Extremities: No edema, pulses DP and PT palpable bilaterally  Neuro: Grossly nonfocal  Data Reviewed: Basic Metabolic Panel:  Recent Labs Lab 10/21/14 0236 10/21/14 2048 10/23/14 0130 10/24/14 0150 10/25/14 0147  NA 134* 133* 131* 130* 135  K 4.3 4.3 4.6 4.0 3.9  CL 103 100* 98* 98* 102  CO2 26 28 28 25 27   GLUCOSE 109* 119* 125* 104* 102*  BUN 5* 6 <5* 6 5*  CREATININE 0.94 1.08 1.00 0.97 1.04  CALCIUM 8.4* 8.5*  8.6* 8.3* 8.6*  MG  --  1.7  --   --   --   PHOS  --  3.4  --   --   --    Liver Function Tests:  Recent Labs Lab 10/21/14 2048 10/25/14 0147  AST 49* 24  ALT 28 23  ALKPHOS 85 39  BILITOT 0.5 0.4  PROT 5.7* 6.0*  ALBUMIN 1.9* 1.8*   No results for input(s): LIPASE, AMYLASE in the last 168 hours. No results for input(s): AMMONIA in the last 168 hours. CBC:  Recent Labs Lab 10/21/14 0236 10/21/14 2048 10/23/14 0130 10/24/14 0150  WBC 11.6* 14.3* 15.3* 8.4  NEUTROABS  --  10.7*  --   --   HGB 10.0* 10.3* 10.0* 8.8*  HCT 30.9* 31.0* 30.5* 27.8*  MCV 83.1 84.0 84.5 85.0  PLT 302 294 324 344   Cardiac Enzymes:  Recent Labs Lab 10/25/14 0147 10/25/14 0800 10/25/14 1534 10/25/14 2140 10/26/14 0353  TROPONINI 0.06* 0.06* 0.06* 0.06* 0.07*   BNP: Invalid input(s): POCBNP CBG:  Recent Labs Lab 10/22/14 0806 10/23/14 0823 10/24/14 0623 10/25/14 0642 10/26/14 0713  GLUCAP 108* 108* 98 103* 119*    Blood Culture (routine x 2)     Status: None   Collection Time: 10/18/14  1:11 AM  Result Value Ref Range Status   Specimen Description BLOOD RIGHT ANTECUBITAL  Final   Report Status 10/20/2014 FINAL  Final   Organism ID, Bacteria ENTEROCOCCUS SPECIES  Final      Susceptibility   Enterococcus species - MIC*    AMPICILLIN <=2 SENSITIVE Sensitive     VANCOMYCIN 1 SENSITIVE Sensitive     GENTAMICIN SYNERGY SENSITIVE Sensitive     LINEZOLID 2 SENSITIVE Sensitive     * ENTEROCOCCUS SPECIES  Blood Culture (routine x 2)     Status: None   Collection Time: 10/18/14  1:19 AM  Result Value Ref Range Status   Specimen Description BLOOD RIGHT WRIST  Final    ENTEROCOCCUS SPECIES    Report Status 10/20/2014 FINAL  Final  Urine culture     Status: None   Collection Time: 10/18/14  2:56 AM  Result Value Ref Range Status   Specimen Description URINE, CATHETERIZED  Final   Special Requests NONE  Final   Report Status 10/20/2014 FINAL  Final   Organism ID, Bacteria  ENTEROCOCCUS FAECALIS  Final      Susceptibility   Enterococcus faecalis - MIC*    AMPICILLIN <=2 SENSITIVE Sensitive     LEVOFLOXACIN 1 SENSITIVE Sensitive     NITROFURANTOIN <=16 SENSITIVE Sensitive     VANCOMYCIN 1 SENSITIVE Sensitive     LINEZOLID 2 SENSITIVE Sensitive     * 20,000 COLONIES/mL ENTEROCOCCUS  FAECALIS  MRSA PCR Screening     Status: None   Collection Time: 10/18/14  5:58 PM  Result Value Ref Range Status   MRSA by PCR NEGATIVE NEGATIVE Final  Culture, blood (routine x 2)     Status: None (Preliminary result)   Collection Time: 10/20/14 11:10 AM  Result Value Ref Range Status   Specimen Description BLOOD RIGHT ARM  Final   Special Requests BOTTLES DRAWN AEROBIC AND ANAEROBIC 5CC  Final   Culture NO GROWTH 3 DAYS  Final   Report Status PENDING  Incomplete  Culture, blood (routine x 2)     Status: None (Preliminary result)   Collection Time: 10/20/14 11:26 AM  Result Value Ref Range Status   Specimen Description BLOOD LEFT HAND  Final   Special Requests BOTTLES DRAWN AEROBIC AND ANAEROBIC 5CC  Final   Culture NO GROWTH 3 DAYS  Final   Report Status PENDING  Incomplete     Scheduled Meds: . ampicillin (OMNIPEN) IV  2 g Intravenous Q4H  . aspirin  81 mg Oral Daily  . cefTRIAXone (ROCEPHIN)  IV  2 g Intravenous Q24H  . chlorhexidine  15 mL Mouth/Throat BID  . feeding supplement (ENSURE ENLIVE)  237 mL Oral TID BM  . pramipexole  1.5 mg Oral QHS   Continuous Infusions:

## 2014-10-26 NOTE — Progress Notes (Signed)
Occupational Therapy Treatment Patient Details Name: Alan [name] MRN: [id number] DOB: 22-May-1950 Today's Date: 10/26/2014    History of present illness 64 y.o. male with PMH of prostate cancer, RLS, who resents result in mental status, dizziness and rashes. Patient has been having dizziness and poor balance in the past 2-3 weeks. No unilateral weakness, vision change or hearing loss. Patient recently did a lot of yard work in [place], such as cutting trees. He could have had insect bite per his wife. Patient became confused and lethargic, and was found asleep by security guard at work (works at [employer]) at about 9:30 PM. He was also found to have fever with temperature 101.2 and petechial rashes over bilateral legs. Currently presents with acute encephalopathy.   OT comments  Focus of session on B UE exercise with scapula facilitation/strengthening in supine and in sitting.  Full AROM of elbows and by end of session pt able to perform hand to top of head in sitting.  Instructed pt to continue AROM of shoulders and scapula in supine and sitting outside of therapy.    Follow Up Recommendations  Outpatient OT (or PT)    Equipment Recommendations  None recommended by OT    Recommendations for Other Services      Precautions / Restrictions Precautions Precautions: Fall       Mobility Bed Mobility   Bed Mobility: Supine to Sit     Supine to sit: Modified independent (Device/Increase time) Sit to supine: Modified independent (Device/Increase time)   General bed mobility comments: no physical assist, HOB up 30 degrees  Transfers                      Balance     Sitting balance-Leahy Scale: Good                             ADL                                                Vision                     Perception     Praxis      Cognition   Behavior During Therapy: WFL for tasks  assessed/performed Overall Cognitive Status: Within Functional Limits for tasks assessed                       Extremity/Trunk Assessment               Exercises General Exercises - Upper Extremity Shoulder Flexion: AAROM;Both;10 reps;Supine;Seated Shoulder Extension: AAROM;Both;10 reps;Supine;Seated Shoulder ABduction: AAROM;Left;Supine Elbow Flexion: AROM;Both;Supine Elbow Extension: AROM;Both;Supine Other Exercises Other Exercises: Encouraged AROM of R hand and wrist to decreased edema. Other Exercises: scapula AROM with min faciltation seated EOB x 10   Shoulder Instructions       General Comments      Pertinent Vitals/ Pain       Pain Assessment: Faces Faces Pain Scale: Hurts little more Pain Location: L wrist/back of hand Pain Descriptors / Indicators: Sore Pain Intervention(s): Limited activity within patient's tolerance;Monitored during session;Repositioned  Home Living  Prior Functioning/Environment              Frequency Min 2X/week     Progress Toward Goals  OT Goals(current goals can now be found in the care plan section)  Progress towards OT goals: Progressing toward goals     Plan Discharge plan remains appropriate    Co-evaluation                 End of Session     Activity Tolerance Patient tolerated treatment well   Patient Left in bed;with call bell/phone within reach;with nursing/sitter in room   Nurse Communication          Time: 1109-1140 OT Time Calculation (min): 31 min  Charges: OT General Charges $OT Visit: 1 Procedure OT Treatments $Therapeutic Exercise: 23-37 mins  Alan [name] 10/26/2014, 12:59 PM  [phone]

## 2014-10-26 NOTE — Progress Notes (Signed)
Patient Name: Alan [name] Date of Encounter: 10/26/2014  Principal Problem:   Enterococcal bacteremia Active Problems:   Restless leg syndrome   Prostate cancer   Acute encephalopathy   Rash   Sepsis   Normocytic anemia   Dizziness   Acute respiratory failure   Aortic valve endocarditis - Enterococcus   Endocarditis of mitral valve - Enterococcus   Paroxysmal atrial fibrillation   Paroxysmal atrial flutter   Atherosclerotic peripheral vascular disease   Aortic valve insufficiency, severe   Mitral valve regurgitation, infectious   Septic embolism   Leukocytosis   Troponin level elevated   Endocarditis   Splenic infarction   Protein-calorie malnutrition, severe   Coronary artery disease involving native coronary artery without angina pectoris   Stroke, embolic     Primary Cardiologist: New - Dr. Sallyanne Kuster Patient Profile: 64 y/o M with PMH of RLS, prostate CA, remote tobacco/alcohol use who presented to [facility]/17/2014 with AMS, dizziness, and rash. Found to have Entercoccal bacteremia  SUBJECTIVE: Feeling well. Denies any chest pain, shortness of breath, or palpitations.   OBJECTIVE Filed Vitals:   10/25/14 1050 10/25/14 1613 10/25/14 2020 10/26/14 0314  BP: 128/85 132/73 138/77 142/82  Pulse: 91 93 92 98  Temp:  98.3 F (36.8 C) 99.2 F (37.3 C) 99.6 F (37.6 C)  TempSrc:  Oral Oral Oral  Resp:  20  20  Height:      Weight:      SpO2: 93% 96% 95% 95%    Intake/Output Summary (Last 24 hours) at 10/26/14 0736 Last data filed at 10/26/14 0330  Gross per 24 hour  Intake    400 ml  Output   1200 ml  Net   -800 ml   Filed Weights   10/23/14 1224 10/24/14 0330 10/25/14 0152  Weight: 167 lb 8.8 oz (76 kg) 168 lb 6.9 oz (76.4 kg) 167 lb 5.3 oz (75.9 kg)    PHYSICAL EXAM General: Well developed, well nourished, male in no acute distress. Head: Normocephalic, atraumatic.  Neck: Supple without bruits, JVD not elevated. Lungs:   Resp regular and unlabored, CTA without wheezing or rales. Heart: RRR, S1, S2, no S3, S4, or murmur; no rub. Abdomen: Soft, non-tender, non-distended with normoactive bowel sounds. No hepatomegaly. No rebound/guarding. No obvious abdominal masses. Extremities: No clubbing, cyanosis, or edema. Distal pedal pulses are 2+ bilaterally. Neuro: Alert and oriented X 3. Moves all extremities spontaneously. Psych: Normal affect.    LABS: CBC: Recent Labs  10/24/14 0150  WBC 8.4  HGB 8.8*  HCT 27.8*  MCV 85.0  PLT 322   Basic Metabolic Panel: Recent Labs  10/24/14 0150 10/25/14 0147  NA 130* 135  K 4.0 3.9  CL 98* 102  CO2 25 27  GLUCOSE 104* 102*  BUN 6 5*  CREATININE 0.97 1.04  CALCIUM 8.3* 8.6*   Liver Function Tests: Recent Labs  10/25/14 0147  AST 24  ALT 23  ALKPHOS 39  BILITOT 0.4  PROT 6.0*  ALBUMIN 1.8*   Cardiac Enzymes: Recent Labs  10/25/14 1534 10/25/14 2140 10/26/14 0353  TROPONINI 0.06* 0.06* 0.07*    TELE: Sinus rhythm with rate in 90's - 110's.       ECHO: 10/20/2014 Patient had frequent bouts of rapid atrial flutter during case that made images difficult Severe AR with vegetation on non coronary cusp ventricular side . Large 1.5 cm and mobile Mild MR with smaller vegetation on anterior leaflet atrial side. No  abscess or intervalvular fibrosa involvement Normal EF 65% Normal TV, PV No effusion No ASD No aortic debris No LAA thrombus.  Radiology/Studies: Dg Chest 2 View: 10/25/2014   CLINICAL DATA:  CHF, history endocarditis, prostate cancer, atrial fibrillation, former smoker  EXAM: CHEST  2 VIEW  COMPARISON:  10/18/2014  FINDINGS: Upper normal heart size.  Mediastinal contours and pulmonary vascularity normal.  Bibasilar effusions and atelectasis greater on LEFT new since previous exam.  Upper lungs clear.  No pneumothorax.  Bones unremarkable.  IMPRESSION: Bibasilar effusions and atelectasis LEFT greater than RIGHT  new since 10/18/2014.   Electronically Signed   By: Lavonia Dana M.D.   On: 10/25/2014 07:17   Mr Abdomen W Wo Contrast: 10/24/2014: CLINICAL DATA:  Sepsis, splenic lesion. Concern for splenic infarction.  EXAM: MRI ABDOMEN WITHOUT AND WITH CONTRAST  TECHNIQUE: Multiplanar multisequence MR imaging of the abdomen was performed both before and after the administration of intravenous contrast.  CONTRAST:  88mL MULTIHANCE GADOBENATE DIMEGLUMINE 529 MG/ML IV SOLN  COMPARISON:  Ultrasound 10/23/2014  FINDINGS: Lower chest:  There is bibasilar atelectasis and small effusions.  Hepatobiliary: No focal hepatic lesion. No biliary duct dilatation. The gallbladder is normal. Common bile duct is normal.  Pancreas: Normal pancreatic parenchymal intensity. No ductal dilatation or inflammation.  Spleen: There is a wedge-shaped geographic region of nonenhancement within the central spleen extending from the hilum to the cortex measuring 8.6 by 5.8 by 6.4 cm consistent with a large midpole splenic infarction. No splenic hematoma or abscess identified.  Adrenals/urinary tract: Adrenal glands and kidneys are normal. There is a nonenhancing cysts within the cortex of the RIGHT kidney measuring 16 mm on series 1302.  Stomach/Bowel: Stomach and limited of the small bowel is unremarkable  Vascular/Lymphatic: Abdominal aorta normal caliber. No retroperitoneal periportal lymphadenopathy.  Musculoskeletal: No aggressive osseous lesion  IMPRESSION: 1. 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 2. No evidence of renal infection, pancreatitis, or hepatic abscess. 3. No cholelithiasis or biliary obstruction. 4. Bilateral pleural effusions and basilar atelectasis.   Electronically Signed   By: Suzy Bouchard M.D.   On: 10/24/2014 18:37     Current Medications:  . ampicillin (OMNIPEN) IV  2 g Intravenous Q4H  . aspirin  81 mg Oral Daily  . cefTRIAXone (ROCEPHIN)  IV  2 g Intravenous Q24H  . chlorhexidine  15 mL Mouth/Throat BID   . feeding supplement (ENSURE ENLIVE)  237 mL Oral TID BM  . pramipexole  1.5 mg Oral QHS      ASSESSMENT AND PLAN: 1. Enterococcus Endocarditis - TEE on 10/20/2014 showed MV vegetation on atrial surface of AMVL with mild MR and large mobile vegetation on noncoronary cusp with severe AR with no evidence of abscess. Normal LVF.  - MRA of brain on 10/20/2014 showed scattered small acute to subacute, and occasional early chronic, lacunar infarcts in both MCA territories and the left cerebellum. None of these appear hemorrhagic (cerebral septic emboli are prone to bleed), but consider sequelae of endocarditis and septic emboli in this setting. - cardiac cath performed on 10/23/2014 and he had 70% stenosis in his proximal to mid LAD and  LAD, 70% stenosis in ostial 2nd Mrg, and 90% stenosis in LPDA. Recommended by Dr. Gwenlyn Found he will likely need coronary artery bypass grafting at the time of aortic valve replacement.  - currently receiving Ceftriaxone and Ampicillin - plan for surgery per Dr. Roxy Manns is next week for aortic valve replacement and CABG.  2. Paroxysmal atrial fibrillation  and paroxysmal atrial flutter, newly recognized - Has been in Sinus rhythm with rate in 90's, and peaks around 110. - CHADSVASC now 3. (age >69 and stroke). No anticoagulation for now.   3. Elevated Troponin  - have been trending flat at 0.06 - likely demand ischemia in setting of septic emboli and bacteremia.  3. Anemia of unknown chronicity, possibly related to above process - stable at 10.0 on 10/23/2014    Franchot Heidelberg , PA-C 7:36 AM 10/26/2014 Pager: [phone]  History and all data above reviewed.  Patient examined.  I agree with the findings as above.   He denies any acute complaints such as SOB or pain.  The patient exam reveals COR:   RRR, murmurs unchanged  ,  Lungs: Clear  ,  Abd: Positive bowel sounds, no rebound no guarding, Ext No edema  .  All available labs, radiology testing,  previous records reviewed. Agree with documented assessment and plan.   Endocarditis:  Preop testing completed.  Maintaining NSR.  CAD as above.  Plan valve surgery and CABG Tues or Wed of next week.  We will continue to follow as needed.    Minus Breeding  9:53 AM  10/26/2014

## 2014-10-26 NOTE — Progress Notes (Signed)
CARDIAC REHAB PHASE I   PRE:  Rate/Rhythm: 92 SR    BP: sitting 110/61    SaO2: 93 RA  MODE:  Ambulation: 700 ft   POST:  Rate/Rhythm: 109 ST    BP: sitting 153/79     SaO2: 93 RA  Pt weak, slightly unsteady. Takes small steps at times. No major c/o. To BR after walk. Left materials for preop ed. Will f/u to discuss. Encouraged more walking today with family/staff. [phone]   Darrick Meigs CES, ACSM 10/26/2014 9:27 AM

## 2014-10-26 NOTE — Progress Notes (Addendum)
INFECTIOUS DISEASE PROGRESS NOTE  ID: Alan [name] is a 64 y.o. male with  Principal Problem:   Enterococcal bacteremia Active Problems:   Restless leg syndrome   Prostate cancer   Acute encephalopathy   Rash   Sepsis   Normocytic anemia   Dizziness   Acute respiratory failure   Aortic valve endocarditis - Enterococcus   Endocarditis of mitral valve - Enterococcus   Paroxysmal atrial fibrillation   Paroxysmal atrial flutter   Atherosclerotic peripheral vascular disease   Aortic valve insufficiency, severe   Mitral valve regurgitation, infectious   Septic embolism   Leukocytosis   Troponin level elevated   Endocarditis   Splenic infarction   Protein-calorie malnutrition, severe   Coronary artery disease involving native coronary artery without angina pectoris   Stroke, embolic  Subjective: Without complaints, no rashes or diarrhea.   Abtx:  Anti-infectives    Start     Dose/Rate Route Frequency Ordered Stop   10/26/14 1040  cefTRIAXone (ROCEPHIN) 2 g in dextrose 5 % 50 mL IVPB     2 g 100 mL/hr over 30 Minutes Intravenous Every 24 hours 10/25/14 1140     10/23/14 1200  cefTRIAXone (ROCEPHIN) 2 g in dextrose 5 % 50 mL IVPB  Status:  Discontinued     2 g 100 mL/hr over 30 Minutes Intravenous Every 12 hours 10/23/14 1157 10/25/14 1140   10/22/14 1600  cefTRIAXone (ROCEPHIN) 2 g in dextrose 5 % 50 mL IVPB  Status:  Discontinued     2 g 100 mL/hr over 30 Minutes Intravenous Every 24 hours 10/22/14 1522 10/23/14 1157   10/22/14 1200  ampicillin (OMNIPEN) 2 g in sodium chloride 0.9 % 50 mL IVPB    Comments:  Pharmacy may adjust, please monitor patient after starting. thanks   2 g 150 mL/hr over 20 Minutes Intravenous Every 4 hours 10/22/14 1030     10/20/14 1800  gentamicin (GARAMYCIN) IVPB 60 mg  Status:  Discontinued     60 mg 100 mL/hr over 30 Minutes Intravenous Every 12 hours 10/20/14 1207 10/20/14 1550   10/20/14 1800  gentamicin (GARAMYCIN) IVPB 80 mg   Status:  Discontinued     80 mg 100 mL/hr over 30 Minutes Intravenous Every 12 hours 10/20/14 1550 10/22/14 1030   10/19/14 2200  vancomycin (VANCOCIN) IVPB 1000 mg/200 mL premix  Status:  Discontinued     1,000 mg 200 mL/hr over 60 Minutes Intravenous Every 12 hours 10/19/14 0844 10/22/14 1030   10/19/14 0900  doxycycline (VIBRAMYCIN) 100 mg in dextrose 5 % 250 mL IVPB  Status:  Discontinued     100 mg 125 mL/hr over 120 Minutes Intravenous 2 times daily 10/19/14 0805 10/19/14 1248   10/19/14 0900  vancomycin (VANCOCIN) 1,500 mg in sodium chloride 0.9 % 500 mL IVPB     1,500 mg 250 mL/hr over 120 Minutes Intravenous  Once 10/19/14 0820 10/19/14 1103   10/18/14 1700  doxycycline (VIBRAMYCIN) 100 mg in dextrose 5 % 250 mL IVPB  Status:  Discontinued     100 mg 125 mL/hr over 120 Minutes Intravenous Every 12 hours 10/18/14 1241 10/19/14 0803   10/18/14 1600  cefTRIAXone (ROCEPHIN) 2 g in dextrose 5 % 50 mL IVPB  Status:  Discontinued    Comments:  Aware of pcn allergy   2 g 100 mL/hr over 30 Minutes Intravenous Every 24 hours 10/18/14 1517 10/19/14 0803   10/18/14 1515  cefTRIAXone (ROCEPHIN) 1 g in dextrose 5 %  50 mL IVPB  Status:  Discontinued    Comments:  Aware of pcn allergy   1 g 100 mL/hr over 30 Minutes Intravenous Every 24 hours 10/18/14 1510 10/18/14 1516   10/18/14 1000  ciprofloxacin (CIPRO) IVPB 400 mg  Status:  Discontinued     400 mg 200 mL/hr over 60 Minutes Intravenous Every 12 hours 10/18/14 0949 10/18/14 1510   10/18/14 1000  metroNIDAZOLE (FLAGYL) IVPB 500 mg  Status:  Discontinued     500 mg 100 mL/hr over 60 Minutes Intravenous Every 8 hours 10/18/14 0949 10/19/14 1248   10/18/14 0415  doxycycline (VIBRAMYCIN) 100 mg in dextrose 5 % 250 mL IVPB     100 mg 125 mL/hr over 120 Minutes Intravenous  Once 10/18/14 0413 10/18/14 0715      Medications:  Scheduled: . ampicillin (OMNIPEN) IV  2 g Intravenous Q4H  . aspirin  81 mg Oral Daily  . cefTRIAXone (ROCEPHIN)   IV  2 g Intravenous Q24H  . chlorhexidine  15 mL Mouth/Throat BID  . feeding supplement (ENSURE ENLIVE)  237 mL Oral TID BM  . pramipexole  1.5 mg Oral QHS    Objective: Vital signs in last 24 hours: Temp:  [98.3 F (36.8 C)-99.6 F (37.6 C)] 99.1 F (37.3 C) (09/01 1307) Pulse Rate:  [92-98] 93 (09/01 1307) Resp:  [20] 20 (09/01 1307) BP: (110-142)/(61-88) 113/88 mmHg (09/01 1307) SpO2:  [94 %-96 %] 94 % (09/01 1307)   General appearance: alert, cooperative and no distress Resp: clear to auscultation bilaterally Cardio: regular rate and rhythm GI: normal findings: bowel sounds normal and soft, non-tender  Lab Results  Recent Labs  10/24/14 0150 10/25/14 0147  WBC 8.4  --   HGB 8.8*  --   HCT 27.8*  --   NA 130* 135  K 4.0 3.9  CL 98* 102  CO2 25 27  BUN 6 5*  CREATININE 0.97 1.04   Liver Panel  Recent Labs  10/25/14 0147  PROT 6.0*  ALBUMIN 1.8*  AST 24  ALT 23  ALKPHOS 39  BILITOT 0.4   Sedimentation Rate No results for input(s): ESRSEDRATE in the last 72 hours. C-Reactive Protein No results for input(s): CRP in the last 72 hours.  Microbiology: Recent Results (from the past 240 hour(s))  Blood Culture (routine x 2)     Status: None   Collection Time: 10/18/14  1:11 AM  Result Value Ref Range Status   Specimen Description BLOOD RIGHT ANTECUBITAL  Final   Special Requests BOTTLES DRAWN AEROBIC AND ANAEROBIC 10CC  Final   Culture  Setup Time   Final    GRAM POSITIVE COCCI IN PAIRS IN BOTH AEROBIC AND ANAEROBIC BOTTLES CRITICAL RESULT CALLED TO, READ BACK BY AND VERIFIED WITH: P CARBORNE 10/18/14 @ 32 M VESTAL    Culture ENTEROCOCCUS SPECIES  Final   Report Status 10/20/2014 FINAL  Final   Organism ID, Bacteria ENTEROCOCCUS SPECIES  Final      Susceptibility   Enterococcus species - MIC*    AMPICILLIN <=2 SENSITIVE Sensitive     VANCOMYCIN 1 SENSITIVE Sensitive     GENTAMICIN SYNERGY SENSITIVE Sensitive     LINEZOLID 2 SENSITIVE Sensitive       * ENTEROCOCCUS SPECIES  Blood Culture (routine x 2)     Status: None   Collection Time: 10/18/14  1:19 AM  Result Value Ref Range Status   Specimen Description BLOOD RIGHT WRIST  Final   Special Requests BOTTLES DRAWN  AEROBIC AND ANAEROBIC 10CC  Final   Culture  Setup Time   Final    GRAM POSITIVE COCCI IN PAIRS IN BOTH AEROBIC AND ANAEROBIC BOTTLES CRITICAL RESULT CALLED TO, READ BACK BY AND VERIFIED WITH: P CARBORNE 10/18/14 @ 60 M VESTAL    Culture   Final    ENTEROCOCCUS SPECIES SUSCEPTIBILITIES PERFORMED ON PREVIOUS CULTURE WITHIN THE LAST 5 DAYS.    Report Status 10/20/2014 FINAL  Final  Urine culture     Status: None   Collection Time: 10/18/14  2:56 AM  Result Value Ref Range Status   Specimen Description URINE, CATHETERIZED  Final   Special Requests NONE  Final   Culture 20,000 COLONIES/mL ENTEROCOCCUS FAECALIS  Final   Report Status 10/20/2014 FINAL  Final   Organism ID, Bacteria ENTEROCOCCUS FAECALIS  Final      Susceptibility   Enterococcus faecalis - MIC*    AMPICILLIN <=2 SENSITIVE Sensitive     LEVOFLOXACIN 1 SENSITIVE Sensitive     NITROFURANTOIN <=16 SENSITIVE Sensitive     VANCOMYCIN 1 SENSITIVE Sensitive     LINEZOLID 2 SENSITIVE Sensitive     * 20,000 COLONIES/mL ENTEROCOCCUS FAECALIS  MRSA PCR Screening     Status: None   Collection Time: 10/18/14  5:58 PM  Result Value Ref Range Status   MRSA by PCR NEGATIVE NEGATIVE Final    Comment:        The GeneXpert MRSA Assay (FDA approved for NASAL specimens only), is one component of a comprehensive MRSA colonization surveillance program. It is not intended to diagnose MRSA infection nor to guide or monitor treatment for MRSA infections.   Culture, blood (routine x 2)     Status: None   Collection Time: 10/20/14 11:10 AM  Result Value Ref Range Status   Specimen Description BLOOD RIGHT ARM  Final   Special Requests BOTTLES DRAWN AEROBIC AND ANAEROBIC 5CC  Final   Culture NO GROWTH 5 DAYS  Final    Report Status 10/25/2014 FINAL  Final  Culture, blood (routine x 2)     Status: None   Collection Time: 10/20/14 11:26 AM  Result Value Ref Range Status   Specimen Description BLOOD LEFT HAND  Final   Special Requests BOTTLES DRAWN AEROBIC AND ANAEROBIC 5CC  Final   Culture NO GROWTH 5 DAYS  Final   Report Status 10/25/2014 FINAL  Final    Studies/Results: Dg Chest 2 View  10/25/2014   CLINICAL DATA:  CHF, history endocarditis, prostate cancer, atrial fibrillation, former smoker  EXAM: CHEST  2 VIEW  COMPARISON:  10/18/2014  FINDINGS: Upper normal heart size.  Mediastinal contours and pulmonary vascularity normal.  Bibasilar effusions and atelectasis greater on LEFT new since previous exam.  Upper lungs clear.  No pneumothorax.  Bones unremarkable.  IMPRESSION: Bibasilar effusions and atelectasis LEFT greater than RIGHT new since 10/18/2014.   Electronically Signed   By: Lavonia Dana M.D.   On: 10/25/2014 07:17   Mr Abdomen W Wo Contrast  10/24/2014   CLINICAL DATA:  Sepsis, splenic lesion. Concern for splenic infarction.  EXAM: MRI ABDOMEN WITHOUT AND WITH CONTRAST  TECHNIQUE: Multiplanar multisequence MR imaging of the abdomen was performed both before and after the administration of intravenous contrast.  CONTRAST:  13mL MULTIHANCE GADOBENATE DIMEGLUMINE 529 MG/ML IV SOLN  COMPARISON:  Ultrasound 10/23/2014  FINDINGS: Lower chest:  There is bibasilar atelectasis and small effusions.  Hepatobiliary: No focal hepatic lesion. No biliary duct dilatation. The gallbladder is normal. Common bile  duct is normal.  Pancreas: Normal pancreatic parenchymal intensity. No ductal dilatation or inflammation.  Spleen: There is a wedge-shaped geographic region of nonenhancement within the central spleen extending from the hilum to the cortex measuring 8.6 by 5.8 by 6.4 cm consistent with a large midpole splenic infarction. No splenic hematoma or abscess identified.  Adrenals/urinary tract: Adrenal glands and  kidneys are normal. There is a nonenhancing cysts within the cortex of the RIGHT kidney measuring 16 mm on series 1302.  Stomach/Bowel: Stomach and limited of the small bowel is unremarkable  Vascular/Lymphatic: Abdominal aorta normal caliber. No retroperitoneal periportal lymphadenopathy.  Musculoskeletal: No aggressive osseous lesion  IMPRESSION: 1. 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 2. No evidence of renal infection, pancreatitis, or hepatic abscess. 3. No cholelithiasis or biliary obstruction. 4. Bilateral pleural effusions and basilar atelectasis.   Electronically Signed   By: Suzy Bouchard M.D.   On: 10/24/2014 18:37     Assessment/Plan: Enterococcal bacteremia  Repeat BCx are negative  Doing well on dual therapy.   Embolic CVA  Ao and Mr IE  Plan for valve and CABG surgery when pt strength improved  would give him 6 weeks of ceftriaxone and ampicillin from the date of his surgery  He asks how he got this- he has had dental procedures in lat 6 months. As well, his UCx was + for  enterococcus- not clear if this is from his bacteremia or if could have caused.  Drug monitoring   Despite his PEN allergy, he appears to be tolerating Amp  Cr stable  Protein-calorie malnutrition (severe)  Continue nutrition f/u  Available as needed   Total days of antibiotics: 8 (ceftriaxone/amp day 5)         Bobby Rumpf Infectious Diseases (pager) [phone] [email or web address] 10/26/2014, 3:52 PM  LOS: 8 days

## 2014-10-27 ENCOUNTER — Inpatient Hospital Stay (HOSPITAL_COMMUNITY): Payer: BC Managed Care – PPO

## 2014-10-27 ENCOUNTER — Encounter (HOSPITAL_COMMUNITY): Payer: Self-pay | Admitting: Gastroenterology

## 2014-10-27 ENCOUNTER — Other Ambulatory Visit: Payer: Self-pay

## 2014-10-27 DIAGNOSIS — Z01810 Encounter for preprocedural cardiovascular examination: Secondary | ICD-10-CM

## 2014-10-27 DIAGNOSIS — I351 Nonrheumatic aortic (valve) insufficiency: Secondary | ICD-10-CM

## 2014-10-27 DIAGNOSIS — I39 Endocarditis and heart valve disorders in diseases classified elsewhere: Secondary | ICD-10-CM

## 2014-10-27 DIAGNOSIS — I340 Nonrheumatic mitral (valve) insufficiency: Secondary | ICD-10-CM

## 2014-10-27 DIAGNOSIS — D72829 Elevated white blood cell count, unspecified: Secondary | ICD-10-CM

## 2014-10-27 LAB — PULMONARY FUNCTION TEST
DL/VA % PRED: 88 %
DL/VA: 4.16 ml/min/mmHg/L
DLCO COR % PRED: 57 %
DLCO cor: 20.07 ml/min/mmHg
DLCO unc % pred: 45 %
DLCO unc: 15.79 ml/min/mmHg
FEF 25-75 POST: 2.88 L/s
FEF 25-75 Pre: 3.22 L/sec
FEF2575-%CHANGE-POST: -10 %
FEF2575-%Pred-Post: 101 %
FEF2575-%Pred-Pre: 113 %
FEV1-%CHANGE-POST: 2 %
FEV1-%Pred-Post: 74 %
FEV1-%Pred-Pre: 72 %
FEV1-POST: 2.71 L
FEV1-Pre: 2.64 L
FEV1FVC-%CHANGE-POST: 4 %
FEV1FVC-%PRED-PRE: 109 %
FEV6-%Change-Post: -1 %
FEV6-%PRED-PRE: 69 %
FEV6-%Pred-Post: 68 %
FEV6-POST: 3.2 L
FEV6-Pre: 3.25 L
FEV6FVC-%Change-Post: 0 %
FEV6FVC-%PRED-POST: 105 %
FEV6FVC-%Pred-Pre: 104 %
FVC-%CHANGE-POST: -1 %
FVC-%PRED-POST: 65 %
FVC-%PRED-PRE: 66 %
FVC-POST: 3.2 L
FVC-PRE: 3.26 L
POST FEV6/FVC RATIO: 100 %
PRE FEV6/FVC RATIO: 100 %
Post FEV1/FVC ratio: 85 %
Pre FEV1/FVC ratio: 81 %
RV % pred: 68 %
RV: 1.7 L
TLC % PRED: 68 %
TLC: 5.09 L

## 2014-10-27 LAB — URINALYSIS, ROUTINE W REFLEX MICROSCOPIC
Bilirubin Urine: NEGATIVE
Glucose, UA: NEGATIVE mg/dL
Ketones, ur: NEGATIVE mg/dL
LEUKOCYTES UA: NEGATIVE
NITRITE: NEGATIVE
Protein, ur: NEGATIVE mg/dL
SPECIFIC GRAVITY, URINE: 1.011 (ref 1.005–1.030)
Urobilinogen, UA: 0.2 mg/dL (ref 0.0–1.0)
pH: 7 (ref 5.0–8.0)

## 2014-10-27 LAB — URINE MICROSCOPIC-ADD ON

## 2014-10-27 LAB — GLUCOSE, CAPILLARY: Glucose-Capillary: 104 mg/dL — ABNORMAL HIGH (ref 65–99)

## 2014-10-27 MED ORDER — ALBUTEROL SULFATE (2.5 MG/3ML) 0.083% IN NEBU
2.5000 mg | INHALATION_SOLUTION | Freq: Once | RESPIRATORY_TRACT | Status: AC
  Administered 2014-10-27: 2.5 mg via RESPIRATORY_TRACT

## 2014-10-27 MED ORDER — DEXTROSE 5 % IV SOLN
2.0000 g | Freq: Two times a day (BID) | INTRAVENOUS | Status: DC
  Administered 2014-10-27 – 2014-11-12 (×32): 2 g via INTRAVENOUS
  Filled 2014-10-27 (×37): qty 2

## 2014-10-27 NOTE — Progress Notes (Signed)
CARDIAC REHAB PHASE I   PRE:  Rate/Rhythm: 96 SR    BP: sitting 102/62    SaO2:   MODE:  Ambulation: 1000 ft   POST:  Rate/Rhythm: 105 ST    BP: sitting 112/64     SaO2:   Pt stronger today. Stride improved, not as unsteady as yesterday. Able to increase distance but stated he was tired after walk. Discussed sternal precautions, mobility, IS, and d/c planning. Pts wife has [facility] book, asked him to have her bring it back so he could read it. Also needs to watch preop video with his family present. Can walk independently now. [phone]   Josephina Shih [place] CES, ACSM 10/27/2014 11:00 AM

## 2014-10-27 NOTE — Progress Notes (Signed)
VASCULAR LAB PRELIMINARY  PRELIMINARY  PRELIMINARY  PRELIMINARY  Pre-op Cardiac Surgery  Carotid Findings:  Bilateral:  1-39% ICA stenosis.  Vertebral artery flow is antegrade.    Ralene Cork, RVT 10/27/2014 2:55 PM    Upper Extremity Right Left  Brachial Pressures    Radial Waveforms pending   Ulnar Waveforms    Palmar Arch (Allen's Test)     Findings:      Lower  Extremity Right Left  Dorsalis Pedis    Anterior Tibial    Posterior Tibial    Ankle/Brachial Indices      Findings:

## 2014-10-27 NOTE — Progress Notes (Signed)
Patient ID: Alan [name], male   DOB: Sep 21, 1950, 64 y.o.   MRN: [id number] [facility] PROGRESS NOTE  Alan [name] ESP:[id number] DOB: 11-18-1950 DOA: 10/17/2014 PCP: [name], MD  Subjective:   No changes clinically, await cardiac surgery next week.  Brief narrative:    64 y.o. male with past medical history of prostate cancer, RLS who presented to [facility] with worsening symptoms of dizziness and gait instability. He had some mental status changes and was lethargic prior to admission for which reason his wife brought him in to ED. Pt has been working in a yard a lot and per wife she was concerned that he may have had a tick bit.  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 Patient has been seen by ID and cardiology in consultation. He is showing an improvement although slow on therapy with Omnipen and Rocephin.  Barrier to discharge: He will have aortic valve replacement and probable CABG next week.  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 Urine culture also grew enterococcus species. Repeat blood cultures on 10/20/2014 shows no growth to date. - MRI brain on 10/20/2014 showed scattered small acute to subacute and occasional early chronic, lacunar infarcts in both MCA territories and the left cerebellum. consider sequelae of endocarditis and septic emboli in this setting.  - Pt is status post TEE which demonstrated mild MR with vegetation on atrial surface of anterior mitral leaflet as well as severe AR with large mobile vegetation on the non coronary cusp. ID was consulted and pt initially on vanco, gentamycin but currently on Omnipen and rocephin. So far tolerating this therapy well with no signs of adverse reaction.  - Plan is  for cardiac cath today in anticipation of valve surgery. - Continue to monitor in SDU due to acuity of medical issues at present.  - Of note, studies such as UDS, HIV Ab, RPR all normal. - Continue Rocephin and ampicillin -Per CTS recommendation aortic valve replacement with CABG next week.  Active Problems: Paroxysmal atrial fibrillation /Atrial flutter - CHADS vasc score at least 3 (Age, history of CVA) - So far rate relatively controlled - Appreciate cardiology following   Troponin elevation - Likely demand ischemia from acute septic emboli and bacteremia - Plan for cardiac cath today - Continue daily aspirin  History of  CVA  - As evidenced on MRI brain, pt has chronic anterior division left MCA infarct and a small number of chronic micro hemorrhages are seen in both hemispheres in the left cerebellum - Continue daily aspirin - Appreciate physical therapy evaluation   Diarrhea - GI pathogen panel negative  Restless leg syndrome - Continue Pramipexole  Normocytic anemia - Hemoglobin stable at 10 - No current indications for transfusion - Plan for colonoscopy tomorrow   Dizziness - Likely combination of previous stroke and septic emboli - Appreciate PT evaluation   Splenic lesion - Questionable splenic infarct seen on abd [place] but need MRI for better evaluation. MRI pending.    DVT Prophylaxis  - SCD's bilaterally    Code Status: Full.  Family Communication:  plan of care discussed with the patient Disposition Plan:   IV access:  Peripheral IV  Procedures and diagnostic studies:    Dg Orthopantogram 10/21/2014 1. No fracture or bone lesion. No evidence of a dental abscess or untreated carie.  Electronically Signed   By: Lajean Manes M.D.   On: 10/21/2014 10:09   [name] Head Wo Contrast 10/20/2014    1. Scattered small acute to subacute, and occasional early chronic, lacunar infarcts in both MCA territories and the left cerebellum. None of these appear  hemorrhagic (cerebral septic emboli are prone to bleed), but consider sequelae of endocarditis and septic emboli in this setting. No significant edema or mass effect. 2. Chronic anterior division left MCA infarct. A small number of chronic micro hemorrhages are identified in both hemispheres an the left cerebellum. 3. Mild for age intracranial atherosclerosis with no stenosis or branch occlusion identified. Mild intracranial artery dolichoectasia.   Electronically Signed   By: Genevie Ann M.D.   On: 10/20/2014 09:49   Mr [name] Contrast 10/20/2014  Scattered small acute to subacute, and occasional early chronic, lacunar infarcts in both MCA territories and the left cerebellum. None of these appear hemorrhagic (cerebral septic emboli are prone to bleed), but consider sequelae of endocarditis and septic emboli in this setting. No significant edema or mass effect. 2. Chronic anterior division left MCA infarct. A small number of chronic micro hemorrhages are identified in both hemispheres an the left cerebellum. 3. Mild for age intracranial atherosclerosis with no stenosis or branch occlusion identified. Mild intracranial artery dolichoectasia.   Electronically Signed   By: Genevie Ann M.D.   On: 10/20/2014 09:49   US Abdomen Complete 10/23/2014   Large irregular hypoechoic lesion within the spleen which is incompletely characterized on ultrasound. While splenic infarct is not excluded, this is favored to represent a mass lesion. Given the size of the lesion and no priors for comparison, when patient clinically able, recommend evaluation with MRI.  Mild splenomegaly.  Increased renal cortical echogenicity as can be seen with chronic medical renal disease.  Bilateral pleural effusions.  These results will be called to the ordering clinician or representative by the Radiologist Assistant, and communication documented in the PACS or zVision Dashboard.   Electronically Signed   By: Lovey Newcomer M.D.   On: 10/23/2014 11:25   Dg  Chest 2 View 10/18/2014 No active cardiopulmonary disease.   Electronically Signed   By: Lucienne Capers M.D.   On: 10/18/2014 00:49   Cardiac catheterization 10/23/2014   Medical Consultants:  Cardiology [facility] (Dr. Arta Silence) Infectious disease  Other Consultants:  Physical therapy  IAnti-Infectives:         Birdie Hopes, MD  [facility] Pager [phone]  Time spent in minutes: 25 minutes  If 7PM-7AM, please contact night-coverage [email or web address] Password [email or web address] 10/27/2014, 1:15 PM   LOS: 9 days     Objective: Filed Vitals:   10/26/14 2045 10/27/14 0812 10/27/14 1210 10/27/14 1211  BP: 130/68 121/62 121/69 121/69  Pulse:  91  85  Temp:  97.8 F (36.6 C)  97.9 F (36.6 C)  TempSrc:  Oral  Oral  Resp:  18  18  Height:      Weight:      SpO2:  95%  96%    Intake/Output Summary (Last 24 hours) at 10/27/14 1315 Last data filed at 10/27/14 3903  Gross per 24 hour  Intake    360 ml  Output    150 ml  Net    210 ml    Exam:   General:  Pt is alert, follows commands appropriately, not in acute distress  Cardiovascular: tachycardic, S1/S2 appreciated   Respiratory: Clear to auscultation bilaterally, no wheezing, no  crackles, no rhonchi  Abdomen: Soft, non tender, non distended, bowel sounds present  Extremities: No edema, pulses DP and PT palpable bilaterally  Neuro: Grossly nonfocal  Data Reviewed: Basic Metabolic Panel:  Recent Labs Lab 10/21/14 0236 10/21/14 2048 10/23/14 0130 10/24/14 0150 10/25/14 0147  NA 134* 133* 131* 130* 135  K 4.3 4.3 4.6 4.0 3.9  CL 103 100* 98* 98* 102  CO2 26 28 28 25 27   GLUCOSE 109* 119* 125* 104* 102*  BUN 5* 6 <5* 6 5*  CREATININE 0.94 1.08 1.00 0.97 1.04  CALCIUM 8.4* 8.5* 8.6* 8.3* 8.6*  MG  --  1.7  --   --   --   PHOS  --  3.4  --   --   --    Liver Function Tests:  Recent Labs Lab 10/21/14 2048 10/25/14 0147  AST 49* 24  ALT 28 23  ALKPHOS 85 39  BILITOT 0.5 0.4  PROT 5.7* 6.0*   ALBUMIN 1.9* 1.8*   No results for input(s): LIPASE, AMYLASE in the last 168 hours. No results for input(s): AMMONIA in the last 168 hours. CBC:  Recent Labs Lab 10/21/14 0236 10/21/14 2048 10/23/14 0130 10/24/14 0150  WBC 11.6* 14.3* 15.3* 8.4  NEUTROABS  --  10.7*  --   --   HGB 10.0* 10.3* 10.0* 8.8*  HCT 30.9* 31.0* 30.5* 27.8*  MCV 83.1 84.0 84.5 85.0  PLT 302 294 324 344   Cardiac Enzymes:  Recent Labs Lab 10/25/14 0147 10/25/14 0800 10/25/14 1534 10/25/14 2140 10/26/14 0353  TROPONINI 0.06* 0.06* 0.06* 0.06* 0.07*   BNP: Invalid input(s): POCBNP CBG:  Recent Labs Lab 10/23/14 0823 10/24/14 0623 10/25/14 0642 10/26/14 0713 10/27/14 0523  GLUCAP 108* 98 103* 119* 104*    Blood Culture (routine x 2)     Status: None   Collection Time: 10/18/14  1:11 AM  Result Value Ref Range Status   Specimen Description BLOOD RIGHT ANTECUBITAL  Final   Report Status 10/20/2014 FINAL  Final   Organism ID, Bacteria ENTEROCOCCUS SPECIES  Final      Susceptibility   Enterococcus species - MIC*    AMPICILLIN <=2 SENSITIVE Sensitive     VANCOMYCIN 1 SENSITIVE Sensitive     GENTAMICIN SYNERGY SENSITIVE Sensitive     LINEZOLID 2 SENSITIVE Sensitive     * ENTEROCOCCUS SPECIES  Blood Culture (routine x 2)     Status: None   Collection Time: 10/18/14  1:19 AM  Result Value Ref Range Status   Specimen Description BLOOD RIGHT WRIST  Final    ENTEROCOCCUS SPECIES    Report Status 10/20/2014 FINAL  Final  Urine culture     Status: None   Collection Time: 10/18/14  2:56 AM  Result Value Ref Range Status   Specimen Description URINE, CATHETERIZED  Final   Special Requests NONE  Final   Report Status 10/20/2014 FINAL  Final   Organism ID, Bacteria ENTEROCOCCUS FAECALIS  Final      Susceptibility   Enterococcus faecalis - MIC*    AMPICILLIN <=2 SENSITIVE Sensitive     LEVOFLOXACIN 1 SENSITIVE Sensitive     NITROFURANTOIN <=16 SENSITIVE Sensitive     VANCOMYCIN 1  SENSITIVE Sensitive     LINEZOLID 2 SENSITIVE Sensitive     * 20,000 COLONIES/mL ENTEROCOCCUS FAECALIS  MRSA PCR Screening     Status: None   Collection Time: 10/18/14  5:58 PM  Result Value Ref Range Status   MRSA by PCR  NEGATIVE NEGATIVE Final  Culture, blood (routine x 2)     Status: None (Preliminary result)   Collection Time: 10/20/14 11:10 AM  Result Value Ref Range Status   Specimen Description BLOOD RIGHT ARM  Final   Special Requests BOTTLES DRAWN AEROBIC AND ANAEROBIC 5CC  Final   Culture NO GROWTH 3 DAYS  Final   Report Status PENDING  Incomplete  Culture, blood (routine x 2)     Status: None (Preliminary result)   Collection Time: 10/20/14 11:26 AM  Result Value Ref Range Status   Specimen Description BLOOD LEFT HAND  Final   Special Requests BOTTLES DRAWN AEROBIC AND ANAEROBIC 5CC  Final   Culture NO GROWTH 3 DAYS  Final   Report Status PENDING  Incomplete     Scheduled Meds: . ampicillin (OMNIPEN) IV  2 g Intravenous Q4H  . aspirin  81 mg Oral Daily  . cefTRIAXone (ROCEPHIN)  IV  2 g Intravenous Q12H  . chlorhexidine  15 mL Mouth/Throat BID  . feeding supplement (ENSURE ENLIVE)  237 mL Oral TID BM  . pramipexole  1.5 mg Oral QHS   Continuous Infusions:

## 2014-10-27 NOTE — Progress Notes (Signed)
Utilization review complete. Tarrah Furuta RN CCM Case Mgmt phone [phone] 

## 2014-10-27 NOTE — Progress Notes (Signed)
      [facility] [address]       [place] [address]             [phone]     CARDIOTHORACIC SURGERY PROGRESS NOTE  2 Days Post-Op  S/P Procedure(s) (LRB): COLONOSCOPY (N/A)  Subjective: Feeling a little stronger every day.  Appetite improved.  No SOB  Objective: Vital signs in last 24 hours: Temp:  [97.8 F (36.6 C)-99.1 F (37.3 C)] 97.9 F (36.6 C) (09/02 1211) Pulse Rate:  [85-93] 85 (09/02 1211) Cardiac Rhythm:  [-] Sinus tachycardia (09/02 0743) Resp:  [18-20] 18 (09/02 1211) BP: (121-139)/(62-69) 121/69 mmHg (09/02 1211) SpO2:  [94 %-96 %] 96 % (09/02 1211)  Physical Exam:  Rhythm:   sinus  Breath sounds: clear  Heart sounds:  RRR w/ soft systolic murmur  Incisions:  n/a  Abdomen:  soft  Extremities:  Warm, no LE edema   Intake/Output from previous day: 09/01 0701 - 09/02 0700 In: 360 [P.O.:360] Out: 800 [Urine:800] Intake/Output this shift: Total I/O In: 360 [P.O.:360] Out: 150 [Urine:150]  Lab Results: No results for input(s): WBC, HGB, HCT, PLT in the last 72 hours. BMET:  Recent Labs  10/25/14 0147  NA 135  K 3.9  CL 102  CO2 27  GLUCOSE 102*  BUN 5*  CREATININE 1.04  CALCIUM 8.6*    CBG (last 3)   Recent Labs  10/25/14 0642 10/26/14 0713 10/27/14 0523  GLUCAP 103* 119* 104*   PT/INR:  No results for input(s): LABPROT, INR in the last 72 hours.  CXR:  N/A  Assessment/Plan: S/P Procedure(s) (LRB): COLONOSCOPY (N/A)  The patient was again counseled regarding treatment alternatives for management of bacterial endocarditis with his constellation of clinical findings including continued medical therapy versus proceeding with aortic and mitral valve repair or replacement in the near future.    I am skeptical that his aortic valve will be repairable, but we will try to repair it if feasible.  I am much more confident that the mitral valve should be repairable.  Surgical options were discussed at length including conventional  surgical aortic valve replacement using either a mechanical prosthesis or a bioprosthetic tissue valve.  The possibility that aortic root replacement might be necessary if intraoperative findings reveal destruction of the aortic annulus or nearby structures, but this is very unlikely given the appearance of recent TEE.  Discussion was held comparing the relative risks of mechanical valve replacement with need for lifelong anticoagulation versus use of a bioprosthetic tissue valve and the associated potential for late structural valve deterioration and failure.  This discussion was placed in the context of the patient's particular circumstances, and as a result the patient specifically requests that their valve be replaced using a bioprosthetic tissue valve.  The need for concomitant coronary artery bypass grafting was discussed.  We plan to proceed with surgery on Wednesday 11/01/2014 if the patient continues to improve.  I will follow up again on Tuesday 10/31/2014.  I spent in excess of 30 minutes during the conduct of this hospital encounter and >50% of this time involved direct face-to-face encounter with the patient for counseling and/or coordination of their care.    Rexene Alberts 10/27/2014 1:40 PM

## 2014-10-27 NOTE — Progress Notes (Signed)
Occupational Therapy Treatment Patient Details Name: Alan [name] MRN: [id number] DOB: 1950/07/03 Today's Date: 10/27/2014    History of present illness 64 y.o. male with PMH of prostate cancer, RLS, who resents result in mental status, dizziness and rashes. Patient has been having dizziness and poor balance in the past 2-3 weeks. No unilateral weakness, vision change or hearing loss. Patient recently did a lot of yard work in [place], such as cutting trees. He could have had insect bite per his wife. Patient became confused and lethargic, and was found asleep by security guard at work (works at [employer]) at about 9:30 PM. He was also found to have fever with temperature 101.2 and petechial rashes over bilateral legs. Currently presents with acute encephalopathy.   OT comments  Pt continues to progress in UB mobility and functional use.  Pt able to don his socks with set up, ambulating with supervision.  Pt reports being faithful to doing shoulder and scapula exercises since last visit.    Follow Up Recommendations  Outpatient OT (will reassess after sx)    Equipment Recommendations  None recommended by OT    Recommendations for Other Services      Precautions / Restrictions Precautions Precautions: Fall       Mobility Bed Mobility Overal bed mobility: Modified Independent                Transfers Overall transfer level: Needs assistance Equipment used: None Transfers: Sit to/from Stand Sit to Stand: Supervision         General transfer comment: ambulated in hall with supervision so MD could observe    Balance                                   ADL                       Lower Body Dressing: Set up;Sitting/lateral leans (donned socks)   Toilet Transfer: Supervision/safety;Ambulation;Regular Toilet   Toileting- Water quality scientist and Hygiene: Supervision/safety;Sit to/from stand                Vision                      Perception     Praxis      Cognition   Behavior During Therapy: [facility] for tasks assessed/performed Overall Cognitive Status: Within Functional Limits for tasks assessed                       Extremity/Trunk Assessment               Exercises General Exercises - Upper Extremity Shoulder Flexion: AAROM;Both;Supine;Seated;5 reps Shoulder Extension: AAROM;Both;Supine;Seated;5 reps Shoulder Horizontal ABduction: AAROM;Both;5 reps;Supine Shoulder Horizontal ADduction: AROM;Both;5 reps;Supine Other Exercises Other Exercises: shoulder rolls, scapula adduction/abd x 10 in sitting Other Exercises: reaching in multiple planes in sitting with min assist 5 x each UE   Shoulder Instructions       General Comments      Pertinent Vitals/ Pain       Pain Assessment: 0-10 Pain Score: 8  Pain Location: B shoulders  Pain Descriptors / Indicators: Aching;Guarding;Grimacing Pain Intervention(s): Limited activity within patient's tolerance;Monitored during session;Repositioned  Home Living  Prior Functioning/Environment              Frequency Min 2X/week     Progress Toward Goals  OT Goals(current goals can now be found in the care plan section)  Progress towards OT goals: Progressing toward goals  Acute Rehab OT Goals Time For Goal Achievement: 10/30/14 Potential to Achieve Goals: Good  Plan Discharge plan remains appropriate    Co-evaluation                 End of Session Equipment Utilized During Treatment: Gait belt   Activity Tolerance Patient tolerated treatment well   Patient Left with call bell/phone within reach (in bathroom, RN aware)   Nurse Communication          Time: 0920-1000 OT Time Calculation (min): 40 min  Charges: OT General Charges $OT Visit: 1 Procedure OT Treatments $Self Care/Home Management : 8-22 mins $Therapeutic Exercise: 23-37  mins  [name] 10/27/2014, 10:49 AM  [phone]

## 2014-10-27 NOTE — Progress Notes (Signed)
PT Cancellation Note  Patient Details Name: Alan [name] MRN: [id number] DOB: [birth year]   Cancelled Treatment:    Reason Eval/Treat Not Completed: Fatigue/lethargy limiting ability to participate Attempted to see patient x2 today - in testing, and fatigue - has been up walking x2 today and had testing.  Will return in am for PT session.  Despina Pole 10/27/2014, 5:22 PM Carita Pian Sanjuana Kava, [place] Pager [phone]

## 2014-10-28 ENCOUNTER — Inpatient Hospital Stay (HOSPITAL_COMMUNITY): Payer: BC Managed Care – PPO

## 2014-10-28 DIAGNOSIS — Z01810 Encounter for preprocedural cardiovascular examination: Secondary | ICD-10-CM

## 2014-10-28 LAB — GLUCOSE, CAPILLARY: Glucose-Capillary: 106 mg/dL — ABNORMAL HIGH (ref 65–99)

## 2014-10-28 NOTE — Progress Notes (Signed)
Physical Therapy Treatment Patient Details Name: Alan [name] MRN: [id number] DOB: 19-Jan-1951 Today's Date: 10/28/2014    History of Present Illness 64 y.o. male with PMH of prostate cancer, RLS, who resents result in mental status, dizziness and rashes. Patient has been having dizziness and poor balance in the past 2-3 weeks. No unilateral weakness, vision change or hearing loss. Patient recently did a lot of yard work in [place], such as cutting trees. He could have had insect bite per his wife. Patient became confused and lethargic, and was found asleep by security guard at work (works at [employer]) at about 9:30 PM. He was also found to have fever with temperature 101.2 and petechial rashes over bilateral legs. Currently presents with acute encephalopathy.    PT Comments    Patient with good balance during gait.  Improving activity tolerance.  Encouraged ambulation in hallway with nursing or family.  Will continue PT until upcoming surgery - will need re-order for PT post-op when appropriate.  Follow Up Recommendations  Supervision for mobility/OOB;Outpatient PT (will reassess post-op)     Equipment Recommendations  None recommended by PT    Recommendations for Other Services       Precautions / Restrictions Precautions Precautions: Fall Restrictions Weight Bearing Restrictions: No    Mobility  Bed Mobility Overal bed mobility: Modified Independent                Transfers Overall transfer level: Needs assistance Equipment used: None Transfers: Sit to/from Stand Sit to Stand: Supervision            Ambulation/Gait Ambulation/Gait assistance: Min guard Ambulation Distance (Feet): 400 Feet Assistive device: None Gait Pattern/deviations: Step-through pattern;Decreased stride length   Gait velocity interpretation: Below normal speed for age/gender General Gait Details: Min guard for safety. Gait is slow and steady.  No loss of  balance.   Stairs            Wheelchair Mobility    Modified Rankin (Stroke Patients Only)       Balance                                    Cognition Arousal/Alertness: Awake/alert Behavior During Therapy: WFL for tasks assessed/performed Overall Cognitive Status: Within Functional Limits for tasks assessed                      Exercises      General Comments        Pertinent Vitals/Pain Pain Assessment: 0-10 Pain Score: 3  Pain Location: shoulders Pain Descriptors / Indicators: Sore Pain Intervention(s): Monitored during session    Home Living                      Prior Function            PT Goals (current goals can now be found in the care plan section) Progress towards PT goals: Progressing toward goals    Frequency  Min 3X/week    PT Plan Current plan remains appropriate    Co-evaluation             End of Session Equipment Utilized During Treatment: Gait belt Activity Tolerance: Patient tolerated treatment well Patient left: in bed;with call bell/phone within reach     Time: 1107-1121 PT Time Calculation (min) (ACUTE ONLY): 14 min  Charges:  $Gait Training: 8-22 mins  G Codes:      Despina Pole 10/28/2014, 1:34 PM Carita Pian. Sanjuana Kava, [place] Pager [phone]

## 2014-10-28 NOTE — Progress Notes (Signed)
VASCULAR LAB PRELIMINARY  PRELIMINARY  PRELIMINARY  PRELIMINARY  Pre-op Cardiac Surgery  Carotid Findings:  Bilateral:  1-39% ICA stenosis.  Vertebral artery flow is antegrade.    Ralene Cork, RVT 10/28/2014 2:43 PM    Upper Extremity Right Left  Brachial Pressures 116 Triphasic 121 Triphasic  Radial Waveforms Triphasic Triphasic  Ulnar Waveforms Triphasic Triphasic  Palmar Arch (Allen's Test) Normal Normal   Findings:  Palmar arch evaluation -Doppler waveforms remained normal bilaterally with both radial and ulnar compressions.    Lower  Extremity Right Left  Dorsalis Pedis    Anterior Tibial    Posterior Tibial    Ankle/Brachial Indices      Findings:  Pedal pulses were palpable bilaterally at rest.

## 2014-10-28 NOTE — Progress Notes (Signed)
SUBJECTIVE:  He has no complaints except- "not sleeping well"  Anxious about post operative pain   PHYSICAL EXAM Filed Vitals:   10/27/14 1211 10/27/14 1657 10/27/14 1937 10/28/14 0233  BP: 121/69 120/63 132/62 112/70  Pulse: 85 95 93 101  Temp: 97.9 F (36.6 C) 97.8 F (36.6 C) 98.3 F (36.8 C) 98.4 F (36.9 C)  TempSrc: Oral Oral Oral Oral  Resp: 18 18 19 20   Height:      Weight:    156 lb 1.4 oz (70.8 kg)  SpO2: 96% 97% 94% 93%   General:  Looks weak but not acute distress Lungs:  Decreased breath sounds Heart:  RRR, 2/6 systolic murmur LSB  dont hear rumble  Abdomen:  Positive bowel sounds, no rebound no guarding Extremities:  No edema Neuro:  Nonfocal. Hardly able to sit up  LABS: Lab Results  Component Value Date   TROPONINI 0.07* 10/26/2014   Results for orders placed or performed during the hospital encounter of 10/17/14 (from the past 24 hour(s))  Urinalysis, Routine w reflex microscopic (not at [facility])     Status: Abnormal   Collection Time: 10/27/14  9:09 AM  Result Value Ref Range   Color, Urine YELLOW YELLOW   APPearance CLOUDY (A) CLEAR   Specific Gravity, Urine 1.011 1.005 - 1.030   pH 7.0 5.0 - 8.0   Glucose, UA NEGATIVE NEGATIVE mg/dL   Hgb urine dipstick TRACE (A) NEGATIVE   Bilirubin Urine NEGATIVE NEGATIVE   Ketones, ur NEGATIVE NEGATIVE mg/dL   Protein, ur NEGATIVE NEGATIVE mg/dL   Urobilinogen, UA 0.2 0.0 - 1.0 mg/dL   Nitrite NEGATIVE NEGATIVE   Leukocytes, UA NEGATIVE NEGATIVE  Urine microscopic-add on     Status: Abnormal   Collection Time: 10/27/14  9:09 AM  Result Value Ref Range   WBC, UA 3-6 <3 WBC/hpf   RBC / HPF 11-20 <3 RBC/hpf   Bacteria, UA MANY (A) RARE  Glucose, capillary     Status: Abnormal   Collection Time: 10/28/14  6:21 AM  Result Value Ref Range   Glucose-Capillary 106 (H) 65 - 99 mg/dL    Intake/Output Summary (Last 24 hours) at 10/28/14 0840 Last data filed at 10/28/14 0500  Gross per 24 hour  Intake     810 ml  Output   2201 ml  Net  -1391 ml    CATH:     Ost LAD lesion, 60% stenosed.  Prox LAD to Mid LAD lesion, 70% stenosed.  Ost LPDA to LPDA lesion, 90% stenosed.  Ost 2nd Mrg to 2nd Mrg lesion, 70% stenosed.  2nd Mrg lesion, 50% stenosed.  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 Large 1.5 cm and mobile Mild MR with smaller vegetation on anterior leaflet atrial side. No abscess or intervalvular fibrosa involvement Normal EF 65% Normal TV, PV No effusion No ASD No aortic debris No LAA thrombus.  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 Large 1.5 cm and mobile Mild MR with  smaller vegetation on anterior leaflet atrial side. No abscess or intervalvular fibrosa involvement Normal EF 65% Normal TV, PV No effusion No ASD No aortic debris No LAA thrombus.  ASSESSMENT AND PLAN:  PAF/FLUTTER:  Currently NSR.  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  Continuing antibiotics per Dr. Megan Salon.    CAD:  As above.  I will review the films and confer with Dr. Roxy Manns.  Continue medical management.    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 Will transfer to 2000.  Kerin Ransom K PA 10/28/2014 8:40 AM  As above If TTCTS could address his pain concerns that might be helpful  Needs to work on strength  prior to surgery

## 2014-10-28 NOTE — Progress Notes (Signed)
Patient ID: Alan [name], male   DOB: 1950/03/11, 64 y.o.   MRN: [id number] [facility] PROGRESS NOTE  CAP MASSI BWL:[id number] DOB: 10/07/1950 DOA: 10/17/2014 PCP: [name], MD  Subjective:   Feels better, denies any new complaints. Feeling a little stronger, continue antibiotics, you were take valve replacement with CABG next Wednesday.  No changes clinically.  Brief narrative:    64 y.o. male with past medical history of prostate cancer, RLS who presented to [facility] with worsening symptoms of dizziness and gait instability. He had some mental status changes and was lethargic prior to admission for which reason his wife brought him in to ED. Pt has been working in a yard a lot and per wife she was concerned that he may have had a tick bit.  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 Patient has been seen by ID and cardiology in consultation. He is showing an improvement although slow on therapy with Omnipen and Rocephin.  Barrier to discharge: He will have aortic valve replacement and probable CABG next week.  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 Urine culture also grew enterococcus species. Repeat blood cultures on 10/20/2014 shows no growth to date. - MRI brain on 10/20/2014 showed scattered small acute to subacute and occasional early chronic, lacunar infarcts in both MCA territories and the left cerebellum. consider sequelae of endocarditis and septic emboli in this setting.  - Pt is status post TEE which demonstrated mild MR with vegetation on atrial surface of anterior mitral leaflet as well as severe AR with large mobile vegetation on the non coronary cusp. ID was consulted and pt initially on vanco, gentamycin but  currently on Omnipen and rocephin. So far tolerating this therapy well with no signs of adverse reaction.  - Plan is for cardiac cath today in anticipation of valve surgery. - Continue to monitor in SDU due to acuity of medical issues at present.  - Of note, studies such as UDS, HIV Ab, RPR all normal. - Continue Rocephin and ampicillin -Per CTS recommendation aortic valve replacement with CABG next week.  Active Problems: Paroxysmal atrial fibrillation /Atrial flutter - CHADS vasc score at least 3 (Age, history of CVA) - So far rate relatively controlled - Appreciate cardiology following   Troponin elevation - Likely demand ischemia from acute septic emboli and bacteremia - Plan for cardiac cath today - Continue daily aspirin  History of  CVA  - As evidenced on MRI brain, pt has chronic anterior division left MCA infarct and a small number of chronic micro hemorrhages are seen in both hemispheres in the left cerebellum - Continue daily aspirin - Appreciate physical therapy evaluation   Diarrhea - GI pathogen panel negative  Restless leg syndrome - Continue Pramipexole  Normocytic anemia - Hemoglobin stable at 10 - No current indications for transfusion - Plan for colonoscopy tomorrow   Dizziness - Likely combination of previous stroke and septic emboli - Appreciate PT evaluation   Splenic lesion - Questionable splenic infarct seen on abd [place] but need MRI for better evaluation. MRI pending.    DVT Prophylaxis  - SCD's bilaterally    Code Status: Full.  Family Communication:  plan of care discussed with the patient Disposition Plan:   IV access:  Peripheral IV  Procedures and diagnostic studies:    Dg Orthopantogram  10/21/2014 1. No fracture or bone lesion. No evidence of a dental abscess or untreated carie.   Electronically Signed   By: Lajean Manes M.D.   On: 10/21/2014 10:09   [name] Head Wo Contrast 10/20/2014    1. Scattered small acute to subacute, and  occasional early chronic, lacunar infarcts in both MCA territories and the left cerebellum. None of these appear hemorrhagic (cerebral septic emboli are prone to bleed), but consider sequelae of endocarditis and septic emboli in this setting. No significant edema or mass effect. 2. Chronic anterior division left MCA infarct. A small number of chronic micro hemorrhages are identified in both hemispheres an the left cerebellum. 3. Mild for age intracranial atherosclerosis with no stenosis or branch occlusion identified. Mild intracranial artery dolichoectasia.   Electronically Signed   By: Genevie Ann M.D.   On: 10/20/2014 09:49   Mr [name] Contrast 10/20/2014  Scattered small acute to subacute, and occasional early chronic, lacunar infarcts in both MCA territories and the left cerebellum. None of these appear hemorrhagic (cerebral septic emboli are prone to bleed), but consider sequelae of endocarditis and septic emboli in this setting. No significant edema or mass effect. 2. Chronic anterior division left MCA infarct. A small number of chronic micro hemorrhages are identified in both hemispheres an the left cerebellum. 3. Mild for age intracranial atherosclerosis with no stenosis or branch occlusion identified. Mild intracranial artery dolichoectasia.   Electronically Signed   By: Genevie Ann M.D.   On: 10/20/2014 09:49   US Abdomen Complete 10/23/2014   Large irregular hypoechoic lesion within the spleen which is incompletely characterized on ultrasound. While splenic infarct is not excluded, this is favored to represent a mass lesion. Given the size of the lesion and no priors for comparison, when patient clinically able, recommend evaluation with MRI.  Mild splenomegaly.  Increased renal cortical echogenicity as can be seen with chronic medical renal disease.  Bilateral pleural effusions.  These results will be called to the ordering clinician or representative by the Radiologist Assistant, and communication  documented in the PACS or zVision Dashboard.   Electronically Signed   By: Lovey Newcomer M.D.   On: 10/23/2014 11:25   Dg Chest 2 View 10/18/2014 No active cardiopulmonary disease.   Electronically Signed   By: Lucienne Capers M.D.   On: 10/18/2014 00:49   Cardiac catheterization 10/23/2014   Medical Consultants:  Cardiology [facility] (Dr. Arta Silence) Infectious disease  Other Consultants:  Physical therapy  IAnti-Infectives:         Birdie Hopes, MD  [facility] Pager [phone]  Time spent in minutes: 25 minutes  If 7PM-7AM, please contact night-coverage [email or web address] Password [email or web address] 10/28/2014, 11:15 AM   LOS: 10 days     Objective: Filed Vitals:   10/27/14 1657 10/27/14 1937 10/28/14 0233 10/28/14 0901  BP: 120/63 132/62 112/70 116/62  Pulse: 95 93 101 90  Temp: 97.8 F (36.6 C) 98.3 F (36.8 C) 98.4 F (36.9 C) 98.5 F (36.9 C)  TempSrc: Oral Oral Oral Oral  Resp: 18 19 20 18   Height:      Weight:   70.8 kg (156 lb 1.4 oz)   SpO2: 97% 94% 93% 94%    Intake/Output Summary (Last 24 hours) at 10/28/14 1115 Last data filed at 10/28/14 0911  Gross per 24 hour  Intake   1230 ml  Output   2351 ml  Net  -1121 ml    Exam:   General:  Pt is alert, follows commands appropriately, not in acute distress  Cardiovascular: tachycardic, S1/S2 appreciated   Respiratory: Clear to auscultation bilaterally, no wheezing, no crackles, no rhonchi  Abdomen: Soft, non tender, non distended, bowel sounds present  Extremities: No edema, pulses DP and PT palpable bilaterally  Neuro: Grossly nonfocal  Data Reviewed: Basic Metabolic Panel:  Recent Labs Lab 10/21/14 2048 10/23/14 0130 10/24/14 0150 10/25/14 0147  NA 133* 131* 130* 135  K 4.3 4.6 4.0 3.9  CL 100* 98* 98* 102  CO2 28 28 25 27   GLUCOSE 119* 125* 104* 102*  BUN 6 <5* 6 5*  CREATININE 1.08 1.00 0.97 1.04  CALCIUM 8.5* 8.6* 8.3* 8.6*  MG 1.7  --   --   --   PHOS 3.4  --   --   --     Liver Function Tests:  Recent Labs Lab 10/21/14 2048 10/25/14 0147  AST 49* 24  ALT 28 23  ALKPHOS 85 39  BILITOT 0.5 0.4  PROT 5.7* 6.0*  ALBUMIN 1.9* 1.8*   No results for input(s): LIPASE, AMYLASE in the last 168 hours. No results for input(s): AMMONIA in the last 168 hours. CBC:  Recent Labs Lab 10/21/14 2048 10/23/14 0130 10/24/14 0150  WBC 14.3* 15.3* 8.4  NEUTROABS 10.7*  --   --   HGB 10.3* 10.0* 8.8*  HCT 31.0* 30.5* 27.8*  MCV 84.0 84.5 85.0  PLT 294 324 344   Cardiac Enzymes:  Recent Labs Lab 10/25/14 0147 10/25/14 0800 10/25/14 1534 10/25/14 2140 10/26/14 0353  TROPONINI 0.06* 0.06* 0.06* 0.06* 0.07*   BNP: Invalid input(s): POCBNP CBG:  Recent Labs Lab 10/24/14 0623 10/25/14 0642 10/26/14 0713 10/27/14 0523 10/28/14 0621  GLUCAP 98 103* 119* 104* 106*    Blood Culture (routine x 2)     Status: None   Collection Time: 10/18/14  1:11 AM  Result Value Ref Range Status   Specimen Description BLOOD RIGHT ANTECUBITAL  Final   Report Status 10/20/2014 FINAL  Final   Organism ID, Bacteria ENTEROCOCCUS SPECIES  Final      Susceptibility   Enterococcus species - MIC*    AMPICILLIN <=2 SENSITIVE Sensitive     VANCOMYCIN 1 SENSITIVE Sensitive     GENTAMICIN SYNERGY SENSITIVE Sensitive     LINEZOLID 2 SENSITIVE Sensitive     * ENTEROCOCCUS SPECIES  Blood Culture (routine x 2)     Status: None   Collection Time: 10/18/14  1:19 AM  Result Value Ref Range Status   Specimen Description BLOOD RIGHT WRIST  Final    ENTEROCOCCUS SPECIES    Report Status 10/20/2014 FINAL  Final  Urine culture     Status: None   Collection Time: 10/18/14  2:56 AM  Result Value Ref Range Status   Specimen Description URINE, CATHETERIZED  Final   Special Requests NONE  Final   Report Status 10/20/2014 FINAL  Final   Organism ID, Bacteria ENTEROCOCCUS FAECALIS  Final      Susceptibility   Enterococcus faecalis - MIC*    AMPICILLIN <=2 SENSITIVE Sensitive      LEVOFLOXACIN 1 SENSITIVE Sensitive     NITROFURANTOIN <=16 SENSITIVE Sensitive     VANCOMYCIN 1 SENSITIVE Sensitive     LINEZOLID 2 SENSITIVE Sensitive     * 20,000 COLONIES/mL ENTEROCOCCUS FAECALIS  MRSA PCR Screening     Status: None   Collection Time: 10/18/14  5:58 PM  Result Value Ref Range Status   MRSA by PCR NEGATIVE  NEGATIVE Final  Culture, blood (routine x 2)     Status: None (Preliminary result)   Collection Time: 10/20/14 11:10 AM  Result Value Ref Range Status   Specimen Description BLOOD RIGHT ARM  Final   Special Requests BOTTLES DRAWN AEROBIC AND ANAEROBIC 5CC  Final   Culture NO GROWTH 3 DAYS  Final   Report Status PENDING  Incomplete  Culture, blood (routine x 2)     Status: None (Preliminary result)   Collection Time: 10/20/14 11:26 AM  Result Value Ref Range Status   Specimen Description BLOOD LEFT HAND  Final   Special Requests BOTTLES DRAWN AEROBIC AND ANAEROBIC 5CC  Final   Culture NO GROWTH 3 DAYS  Final   Report Status PENDING  Incomplete     Scheduled Meds: . ampicillin (OMNIPEN) IV  2 g Intravenous Q4H  . aspirin  81 mg Oral Daily  . cefTRIAXone (ROCEPHIN)  IV  2 g Intravenous Q12H  . chlorhexidine  15 mL Mouth/Throat BID  . feeding supplement (ENSURE ENLIVE)  237 mL Oral TID BM  . pramipexole  1.5 mg Oral QHS   Continuous Infusions:

## 2014-10-28 NOTE — Progress Notes (Signed)
VASCULAR LAB PRELIMINARY  PRELIMINARY  PRELIMINARY  PRELIMINARY  Pre-op Cardiac Surgery  Carotid Findings:  Bilateral:  1-39% ICA stenosis.  Vertebral artery flow is antegrade.     Upper Extremity Right Left  Brachial Pressures 116 Triphasic 121 Triphasic  Radial Waveforms Triphasic Triphasic  Ulnar Waveforms Triphasic Triphasic  Palmar Arch (Allen's Test) Normal Normal   Findings:   Palmar arch evaluation - Doppler wavefors remained normal bilaterally with both radial and ulnar compressions   Lower  Extremity Right Left  Dorsalis Pedis    Anterior Tibial    Posterior Tibial    Ankle/Brachial Indices      Findings:  Pedal pulses were palpable bilaterally at rest.   Wendel Homeyer, RVS 10/28/2014, 2:39 PM

## 2014-10-29 DIAGNOSIS — I4891 Unspecified atrial fibrillation: Secondary | ICD-10-CM

## 2014-10-29 DIAGNOSIS — I38 Endocarditis, valve unspecified: Secondary | ICD-10-CM

## 2014-10-29 DIAGNOSIS — I4892 Unspecified atrial flutter: Secondary | ICD-10-CM

## 2014-10-29 LAB — GLUCOSE, CAPILLARY: GLUCOSE-CAPILLARY: 96 mg/dL (ref 65–99)

## 2014-10-29 MED ORDER — METOPROLOL TARTRATE 12.5 MG HALF TABLET
12.5000 mg | ORAL_TABLET | Freq: Two times a day (BID) | ORAL | Status: DC
  Administered 2014-10-29 – 2014-10-31 (×5): 12.5 mg via ORAL
  Filled 2014-10-29 (×9): qty 1

## 2014-10-29 MED ORDER — DIPHENHYDRAMINE HCL 50 MG PO CAPS
50.0000 mg | ORAL_CAPSULE | Freq: Every day | ORAL | Status: DC
  Administered 2014-10-29: 50 mg via ORAL
  Filled 2014-10-29: qty 1
  Filled 2014-10-29: qty 2
  Filled 2014-10-29: qty 1

## 2014-10-29 NOTE — Progress Notes (Addendum)
Patient ID: Alan [name], male   DOB: [birth year], 64 y.o.   MRN: [id number] [facility] PROGRESS NOTE  Alan [name] YYT:[id number] DOB: [birth year] DOA: 10/17/2014 PCP: [name], MD  Subjective:   No changes clinically, still has some weakness in both shoulders. Feels better every day, complains about insomnia asked for something to help with sleep.  Brief narrative:    64 y.o. male with past medical history of prostate cancer, RLS who presented to [facility] with worsening symptoms of dizziness and gait instability. He had some mental status changes and was lethargic prior to admission for which reason his wife brought him in to ED. Pt has been working in a yard a lot and per wife she was concerned that he may have had a tick bit.  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 Patient has been seen by ID and cardiology in consultation. He is showing an improvement although slow on therapy with Omnipen and Rocephin.  Barrier to discharge: He will have aortic valve replacement and probable CABG next week.  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 Urine culture also grew enterococcus species. Repeat blood cultures on 10/20/2014 shows no growth to date. - MRI brain on 10/20/2014 showed scattered small acute to subacute and occasional early chronic, lacunar infarcts in both MCA territories and the left cerebellum. consider sequelae of endocarditis and septic emboli in this setting.  - Pt is status post TEE which demonstrated mild MR with vegetation on atrial surface of anterior mitral leaflet as well as severe AR with large mobile vegetation on the non coronary cusp. ID was consulted and pt initially on vanco, gentamycin but currently on  Omnipen and rocephin. So far tolerating this therapy well with no signs of adverse reaction.  - Plan is for cardiac cath today in anticipation of valve surgery. - Continue to monitor in SDU due to acuity of medical issues at present.  - Of note, studies such as UDS, HIV Ab, RPR all normal. - Continue Rocephin and ampicillin -Per CTS recommendation aortic valve replacement with CABG next week.  Active Problems: Paroxysmal atrial fibrillation /Atrial flutter - CHADS vasc score at least 3 (Age, history of CVA) - So far rate relatively controlled - Appreciate cardiology following   Troponin elevation - Likely demand ischemia from acute septic emboli and bacteremia - Plan for cardiac cath today - Continue daily aspirin  History of  CVA  - As evidenced on MRI brain, pt has chronic anterior division left MCA infarct and a small number of chronic micro hemorrhages are seen in both hemispheres in the left cerebellum - Continue daily aspirin - Appreciate physical therapy evaluation   Diarrhea - GI pathogen panel negative  Restless leg syndrome - Continue Pramipexole  Normocytic anemia - Hemoglobin stable at 10 - No current indications for transfusion - Colonoscopy showed no evidence of source of bacteremia  Dizziness - Likely combination of previous stroke and septic emboli - Appreciate PT evaluation   Splenic lesion - Ultrasound showed a questionable lesion, MRI showed questionable infarct.    DVT Prophylaxis  - SCD's bilaterally    Code Status: Full.  Family Communication:  plan of care discussed with the patient Disposition Plan:   IV access:  Peripheral IV  Procedures and diagnostic studies:    Dg Orthopantogram 10/21/2014 1. No fracture  or bone lesion. No evidence of a dental abscess or untreated carie.   Electronically Signed   By: Lajean Manes M.D.   On: 10/21/2014 10:09   [name] Head Wo Contrast 10/20/2014    1. Scattered small acute to subacute, and occasional  early chronic, lacunar infarcts in both MCA territories and the left cerebellum. None of these appear hemorrhagic (cerebral septic emboli are prone to bleed), but consider sequelae of endocarditis and septic emboli in this setting. No significant edema or mass effect. 2. Chronic anterior division left MCA infarct. A small number of chronic micro hemorrhages are identified in both hemispheres an the left cerebellum. 3. Mild for age intracranial atherosclerosis with no stenosis or branch occlusion identified. Mild intracranial artery dolichoectasia.   Electronically Signed   By: Genevie Ann M.D.   On: 10/20/2014 09:49   Mr [name] Contrast 10/20/2014  Scattered small acute to subacute, and occasional early chronic, lacunar infarcts in both MCA territories and the left cerebellum. None of these appear hemorrhagic (cerebral septic emboli are prone to bleed), but consider sequelae of endocarditis and septic emboli in this setting. No significant edema or mass effect. 2. Chronic anterior division left MCA infarct. A small number of chronic micro hemorrhages are identified in both hemispheres an the left cerebellum. 3. Mild for age intracranial atherosclerosis with no stenosis or branch occlusion identified. Mild intracranial artery dolichoectasia.   Electronically Signed   By: Genevie Ann M.D.   On: 10/20/2014 09:49   US Abdomen Complete 10/23/2014   Large irregular hypoechoic lesion within the spleen which is incompletely characterized on ultrasound. While splenic infarct is not excluded, this is favored to represent a mass lesion. Given the size of the lesion and no priors for comparison, when patient clinically able, recommend evaluation with MRI.  Mild splenomegaly.  Increased renal cortical echogenicity as can be seen with chronic medical renal disease.  Bilateral pleural effusions.  These results will be called to the ordering clinician or representative by the Radiologist Assistant, and communication documented in the  PACS or zVision Dashboard.   Electronically Signed   By: Lovey Newcomer M.D.   On: 10/23/2014 11:25   Dg Chest 2 View 10/18/2014 No active cardiopulmonary disease.   Electronically Signed   By: Lucienne Capers M.D.   On: 10/18/2014 00:49   Cardiac catheterization 10/23/2014   Medical Consultants:  Cardiology [facility] (Dr. Arta Silence) Infectious disease  Other Consultants:  Physical therapy  IAnti-Infectives:         Birdie Hopes, MD  [facility] Pager [phone]  Time spent in minutes: 25 minutes  If 7PM-7AM, please contact night-coverage [email or web address] Password [email or web address] 10/29/2014, 10:13 AM   LOS: 11 days     Objective: Filed Vitals:   10/28/14 0901 10/28/14 1438 10/28/14 2012 10/29/14 0506  BP: 116/62 123/62 111/63 121/66  Pulse: 90 91 93 91  Temp: 98.5 F (36.9 C) 97.8 F (36.6 C) 98.2 F (36.8 C) 98.2 F (36.8 C)  TempSrc: Oral Oral Oral Oral  Resp: 18 19 18 18   Height:      Weight:      SpO2: 94% 97% 96% 97%    Intake/Output Summary (Last 24 hours) at 10/29/14 1013 Last data filed at 10/29/14 0337  Gross per 24 hour  Intake    120 ml  Output   1800 ml  Net  -1680 ml    Exam:   General:  Pt is alert, follows commands appropriately, not in  acute distress  Cardiovascular: tachycardic, S1/S2 appreciated   Respiratory: Clear to auscultation bilaterally, no wheezing, no crackles, no rhonchi  Abdomen: Soft, non tender, non distended, bowel sounds present  Extremities: No edema, pulses DP and PT palpable bilaterally  Neuro: Grossly nonfocal  Data Reviewed: Basic Metabolic Panel:  Recent Labs Lab 10/23/14 0130 10/24/14 0150 10/25/14 0147  NA 131* 130* 135  K 4.6 4.0 3.9  CL 98* 98* 102  CO2 28 25 27   GLUCOSE 125* 104* 102*  BUN <5* 6 5*  CREATININE 1.00 0.97 1.04  CALCIUM 8.6* 8.3* 8.6*   Liver Function Tests:  Recent Labs Lab 10/25/14 0147  AST 24  ALT 23  ALKPHOS 39  BILITOT 0.4  PROT 6.0*  ALBUMIN 1.8*   No results  for input(s): LIPASE, AMYLASE in the last 168 hours. No results for input(s): AMMONIA in the last 168 hours. CBC:  Recent Labs Lab 10/23/14 0130 10/24/14 0150  WBC 15.3* 8.4  HGB 10.0* 8.8*  HCT 30.5* 27.8*  MCV 84.5 85.0  PLT 324 344   Cardiac Enzymes:  Recent Labs Lab 10/25/14 0147 10/25/14 0800 10/25/14 1534 10/25/14 2140 10/26/14 0353  TROPONINI 0.06* 0.06* 0.06* 0.06* 0.07*   BNP: Invalid input(s): POCBNP CBG:  Recent Labs Lab 10/25/14 0642 10/26/14 0713 10/27/14 0523 10/28/14 0621 10/29/14 0626  GLUCAP 103* 119* 104* 106* 96    Blood Culture (routine x 2)     Status: None   Collection Time: 10/18/14  1:11 AM  Result Value Ref Range Status   Specimen Description BLOOD RIGHT ANTECUBITAL  Final   Report Status 10/20/2014 FINAL  Final   Organism ID, Bacteria ENTEROCOCCUS SPECIES  Final      Susceptibility   Enterococcus species - MIC*    AMPICILLIN <=2 SENSITIVE Sensitive     VANCOMYCIN 1 SENSITIVE Sensitive     GENTAMICIN SYNERGY SENSITIVE Sensitive     LINEZOLID 2 SENSITIVE Sensitive     * ENTEROCOCCUS SPECIES  Blood Culture (routine x 2)     Status: None   Collection Time: 10/18/14  1:19 AM  Result Value Ref Range Status   Specimen Description BLOOD RIGHT WRIST  Final    ENTEROCOCCUS SPECIES    Report Status 10/20/2014 FINAL  Final  Urine culture     Status: None   Collection Time: 10/18/14  2:56 AM  Result Value Ref Range Status   Specimen Description URINE, CATHETERIZED  Final   Special Requests NONE  Final   Report Status 10/20/2014 FINAL  Final   Organism ID, Bacteria ENTEROCOCCUS FAECALIS  Final      Susceptibility   Enterococcus faecalis - MIC*    AMPICILLIN <=2 SENSITIVE Sensitive     LEVOFLOXACIN 1 SENSITIVE Sensitive     NITROFURANTOIN <=16 SENSITIVE Sensitive     VANCOMYCIN 1 SENSITIVE Sensitive     LINEZOLID 2 SENSITIVE Sensitive     * 20,000 COLONIES/mL ENTEROCOCCUS FAECALIS  MRSA PCR Screening     Status: None   Collection  Time: 10/18/14  5:58 PM  Result Value Ref Range Status   MRSA by PCR NEGATIVE NEGATIVE Final  Culture, blood (routine x 2)     Status: None (Preliminary result)   Collection Time: 10/20/14 11:10 AM  Result Value Ref Range Status   Specimen Description BLOOD RIGHT ARM  Final   Special Requests BOTTLES DRAWN AEROBIC AND ANAEROBIC 5CC  Final   Culture NO GROWTH 3 DAYS  Final   Report Status PENDING  Incomplete  Culture, blood (routine x 2)     Status: None (Preliminary result)   Collection Time: 10/20/14 11:26 AM  Result Value Ref Range Status   Specimen Description BLOOD LEFT HAND  Final   Special Requests BOTTLES DRAWN AEROBIC AND ANAEROBIC 5CC  Final   Culture NO GROWTH 3 DAYS  Final   Report Status PENDING  Incomplete     Scheduled Meds: . ampicillin (OMNIPEN) IV  2 g Intravenous Q4H  . aspirin  81 mg Oral Daily  . cefTRIAXone (ROCEPHIN)  IV  2 g Intravenous Q12H  . chlorhexidine  15 mL Mouth/Throat BID  . feeding supplement (ENSURE ENLIVE)  237 mL Oral TID BM  . pramipexole  1.5 mg Oral QHS   Continuous Infusions:

## 2014-10-29 NOTE — Progress Notes (Signed)
Patient Name: Alan [name] Date of Encounter: 10/29/2014  Principal Problem:   Enterococcal bacteremia Active Problems:   Restless leg syndrome   Prostate cancer   Acute encephalopathy   Rash   Sepsis   Normocytic anemia   Dizziness   Acute respiratory failure   Aortic valve endocarditis - Enterococcus   Endocarditis of mitral valve - Enterococcus   Paroxysmal atrial fibrillation   Paroxysmal atrial flutter   Atherosclerotic peripheral vascular disease   Aortic valve insufficiency, severe   Mitral valve regurgitation, infectious   Septic embolism   Leukocytosis   Troponin level elevated   Endocarditis   Splenic infarction   Protein-calorie malnutrition, severe   Coronary artery disease involving native coronary artery without angina pectoris   Stroke, embolic   Subacute endocarditis   Primary Cardiologist: Dr Sallyanne Kuster  Patient Profile: 64 yo male w/ hx RLS, prostate CA, remote tob/ETOH, admitted 08/23 w/ AMS, dizziness, rash. Cards seeing for PAF, CAD and endocarditis.  SUBJECTIVE: No chest pain or SOB. Feels better today. Doing well, ready for surgery.  OBJECTIVE Filed Vitals:   10/28/14 0901 10/28/14 1438 10/28/14 2012 10/29/14 0506  BP: 116/62 123/62 111/63 121/66  Pulse: 90 91 93 91  Temp: 98.5 F (36.9 C) 97.8 F (36.6 C) 98.2 F (36.8 C) 98.2 F (36.8 C)  TempSrc: Oral Oral Oral Oral  Resp: 18 19 18 18   Height:      Weight:      SpO2: 94% 97% 96% 97%    Intake/Output Summary (Last 24 hours) at 10/29/14 0855 Last data filed at 10/29/14 0370  Gross per 24 hour  Intake    640 ml  Output   2100 ml  Net  -1460 ml   Filed Weights   10/24/14 0330 10/25/14 0152 10/28/14 0233  Weight: 168 lb 6.9 oz (76.4 kg) 167 lb 5.3 oz (75.9 kg) 156 lb 1.4 oz (70.8 kg)    PHYSICAL EXAM General: Well developed, well nourished, male in no acute distress. Head: Normocephalic, atraumatic.  Neck: Supple without bruits, JVD 9 cm. Lungs:  Resp regular  and unlabored, decreased BS bases. Heart: RRR, S1, S2, no S3, S4, 3/6 murmur; no rub. Abdomen: Soft, non-tender, non-distended, BS + x 4.  Extremities: No clubbing, cyanosis, edema.  Neuro: Alert and oriented X 3. Moves all extremities spontaneously. Psych: Normal affect.  LABS: None today  TELE:  SR, occ ectopy  Current Medications:  . ampicillin (OMNIPEN) IV  2 g Intravenous Q4H  . aspirin  81 mg Oral Daily  . cefTRIAXone (ROCEPHIN)  IV  2 g Intravenous Q12H  . chlorhexidine  15 mL Mouth/Throat BID  . feeding supplement (ENSURE ENLIVE)  237 mL Oral TID BM  . pramipexole  1.5 mg Oral QHS      ASSESSMENT AND PLAN: PAF/FLUTTER: Currently NSR. On ASA 81 mg only. Baseline HR/BP might tolerate low-dose BB, MD advise on starting 12.5 Lopressor bid.  ENTEROCOCCUS ENDOCARDITIS: Mitral and aortic valves involved. Continuing antibiotics per Dr. Megan Salon.   CAD: As above. Continue medical management.  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   Otherwise, per IM Principal Problem:   Enterococcal bacteremia Active Problems:   Restless leg syndrome   Prostate cancer   Acute encephalopathy   Rash   Sepsis   Normocytic anemia   Dizziness   Acute respiratory failure   Aortic valve endocarditis - Enterococcus   Endocarditis of mitral valve -  Enterococcus   Paroxysmal atrial fibrillation   Paroxysmal atrial flutter   Atherosclerotic peripheral vascular disease   Aortic valve insufficiency, severe   Mitral valve regurgitation, infectious   Septic embolism   Leukocytosis   Troponin level elevated   Endocarditis   Splenic infarction   Protein-calorie malnutrition, severe   Coronary artery disease involving native coronary artery without angina pectoris   Stroke, embolic   Subacute endocarditis   Signed, Barrett, [address] 8:55 AM 10/29/2014 Patient seen and examined  I agree with findings as noted above by R Barrett  and have amended  Volume status is not bad.  HR and BP OK  WOuld start low dose b blocker Rx and follow   Continue ABX Surgery planned for Wednesday

## 2014-10-30 DIAGNOSIS — I38 Endocarditis, valve unspecified: Secondary | ICD-10-CM

## 2014-10-30 LAB — GLUCOSE, CAPILLARY: Glucose-Capillary: 100 mg/dL — ABNORMAL HIGH (ref 65–99)

## 2014-10-30 MED ORDER — TRAZODONE HCL 50 MG PO TABS
50.0000 mg | ORAL_TABLET | Freq: Every day | ORAL | Status: DC
  Administered 2014-10-30 – 2014-10-31 (×2): 50 mg via ORAL
  Filled 2014-10-30 (×3): qty 1

## 2014-10-30 NOTE — Progress Notes (Signed)
Occupational Therapy Treatment Patient Details Name: Alan [name] MRN: [id number] DOB: [birth year] Today's Date: 10/30/2014    History of present illness 64 y.o. male with PMH of prostate cancer, RLS, who resents result in mental status, dizziness and rashes. Patient has been having dizziness and poor balance in the past 2-3 weeks. No unilateral weakness, vision change or hearing loss. Patient recently did a lot of yard work in [place], such as cutting trees. He could have had insect bite per his wife. Patient became confused and lethargic, and was found asleep by security guard at work (works at [employer]) at about 9:30 PM. He was also found to have fever with temperature 101.2 and petechial rashes over bilateral legs. Currently presents with acute encephalopathy.   OT comments  Pt performed UE exercises and encouraged him to be doing this throughout the day. Also, encouraged him to walk with staff with him.  Follow Up Recommendations  Outpatient OT or PT;Other (comment)-will reassess after surgery   Equipment Recommendations  None recommended by OT    Recommendations for Other Services      Precautions / Restrictions Precautions Precautions: Fall Restrictions Weight Bearing Restrictions: No       Mobility Bed Mobility Overal bed mobility: Needs Assistance Bed Mobility: Sidelying to Sit   Sidelying to sit: Mod assist          Transfers Overall transfer level: Modified independent     Sit to Stand: Modified independent (Device/Increase time)              Balance    No LOB in session.                                ADL Overall ADL's : Needs assistance/impaired                         Toilet Transfer: Supervision/safety;Ambulation (sit to stand from bed-Mod I)             General ADL Comments: OT assisted in donning pt's gown to go around back (as a robe); pt able to doff it with no physical assist. Explained  benefit of UE exercises.      Vision                     Perception     Praxis      Cognition  Awake/Alert Behavior During Therapy: WFL for tasks assessed/performed Overall Cognitive Status: Within Functional Limits for tasks assessed, decreased short term memory of exercises.                       Extremity/Trunk Assessment               Exercises Other Exercises Other Exercises: Pt performed AAROM bilateral shoulder flexion (2 reps on right and 5 on left)-limited due to pain; performed in supine Other Exercises: Pt performed AROM bilateral elbow flexion/extension (10 reps each) in supine Other Exercises: Attempted AAROM shoulder horizontal adduction with bilateral shoulders but was painful and could only perform a 2-3 reps? Other Exercises: bilateral shoulder rolls Other Exercises: scapular adduction/abduction while sitting   Shoulder Instructions       General Comments      Pertinent Vitals/ Pain       Pain Assessment: 0-10 Pain Score:  (6 in right shoulder; 8 in left shoulder) Pain Location:  shoulders Pain Intervention(s): Monitored during session;Limited activity within patient's tolerance  Home Living                                          Prior Functioning/Environment              Frequency Min 2X/week     Progress Toward Goals  OT Goals(current goals can now be found in the care plan section)  Progress towards OT goals: Progressing toward goals-updated a goal and changed time for goal achievement as goals were due to be updated today  Acute Rehab OT Goals Patient Stated Goal: not stated OT Goal Formulation: With patient Time For Goal Achievement: 11/06/14 Potential to Achieve Goals: Good ADL Goals Pt Will Perform Grooming: with min assist;with adaptive equipment;sitting;standing Pt Will Perform Upper Body Dressing: with min assist;sitting Pt Will Transfer to Toilet: ambulating;bedside commode;with  modified independence (over toilet) Pt/caregiver will Perform Home Exercise Program: Increased ROM;Increased strength;Both right and left upper extremity;With minimal assist;With written HEP provided (A/AAROM shoulders) Additional ADL Goal #1: Pt will be supervision for in and OOB for BADLs with HOB flat and no rail  Plan Discharge plan remains appropriate    Co-evaluation                 End of Session Equipment Utilized During Treatment: Gait belt   Activity Tolerance Patient limited by pain;Patient tolerated treatment well   Patient Left in chair;with call bell/phone within reach   Nurse Communication          Time: 5183-3582 OT Time Calculation (min): 21 min  Charges: OT General Charges $OT Visit: 1 Procedure OT Treatments $Therapeutic Exercise: 8-22 mins  Benito Mccreedy OTR/L [phone] 10/30/2014, 4:14 PM

## 2014-10-30 NOTE — Progress Notes (Signed)
SUBJECTIVE:  Feels ok. No new complaints.  OBJECTIVE:   Vitals:   Filed Vitals:   10/29/14 0506 10/29/14 1424 10/29/14 2122 10/30/14 0553  BP: 121/66 103/66 110/60 113/58  Pulse: 91 99 75 87  Temp: 98.2 F (36.8 C) 98.1 F (36.7 C) 98.4 F (36.9 C) 98.2 F (36.8 C)  TempSrc: Oral Oral Oral Oral  Resp: 18 17 18 18   Height:      Weight:      SpO2: 97% 98% 96% 97%   I&O's:   Intake/Output Summary (Last 24 hours) at 10/30/14 0949 Last data filed at 10/30/14 0557  Gross per 24 hour  Intake    240 ml  Output    750 ml  Net   -510 ml   TELEMETRY: Reviewed telemetry pt in NSR, PACs:     PHYSICAL EXAM General: Well developed, well nourished, in no acute distress Head:   Normal cephalic and atramatic  Lungs:   Clear bilaterally to auscultation. Heart:   HRRR S1 S2  No JVD. 2/6 systolic murmur  Abdomen: abdomen soft and non-tender Msk:  Back normal,  Normal strength and tone for age. Extremities:   No edema.   Neuro: Alert and oriented. Psych:  Normal affect, responds appropriately Skin: No rash   LABS: Basic Metabolic Panel: No results for input(s): NA, K, CL, CO2, GLUCOSE, BUN, CREATININE, CALCIUM, MG, PHOS in the last 72 hours. Liver Function Tests: No results for input(s): AST, ALT, ALKPHOS, BILITOT, PROT, ALBUMIN in the last 72 hours. No results for input(s): LIPASE, AMYLASE in the last 72 hours. CBC: No results for input(s): WBC, NEUTROABS, HGB, HCT, MCV, PLT in the last 72 hours. Cardiac Enzymes: No results for input(s): CKTOTAL, CKMB, CKMBINDEX, TROPONINI in the last 72 hours. BNP: Invalid input(s): POCBNP D-Dimer: No results for input(s): DDIMER in the last 72 hours. Hemoglobin A1C: No results for input(s): HGBA1C in the last 72 hours. Fasting Lipid Panel: No results for input(s): CHOL, HDL, LDLCALC, TRIG, CHOLHDL, LDLDIRECT in the last 72 hours. Thyroid Function Tests: No results for input(s): TSH, T4TOTAL, T3FREE, THYROIDAB in the last 72  hours.  Invalid input(s): FREET3 Anemia Panel: No results for input(s): VITAMINB12, FOLATE, FERRITIN, TIBC, IRON, RETICCTPCT in the last 72 hours. Coag Panel:   Lab Results  Component Value Date   INR 1.39 10/18/2014    RADIOLOGY: Dg Orthopantogram  10/21/2014   CLINICAL DATA:  Dental anomaly.  Endocarditis.  EXAM: ORTHOPANTOGRAM/PANORAMIC  COMPARISON:  None.  FINDINGS: No fracture or bone lesion.  There are multiple metal fillings. Dentition is otherwise unremarkable with no periapical lucency to suggest a dental abscess. All third molars have been removed. Second and third molar have been removed the right axilla.  IMPRESSION: 1. No fracture or bone lesion. No evidence of a dental abscess or untreated carie.   Electronically Signed   By: Lajean Manes M.D.   On: 10/21/2014 10:09   Dg Chest 2 View  10/25/2014   CLINICAL DATA:  CHF, history endocarditis, prostate cancer, atrial fibrillation, former smoker  EXAM: CHEST  2 VIEW  COMPARISON:  10/18/2014  FINDINGS: Upper normal heart size.  Mediastinal contours and pulmonary vascularity normal.  Bibasilar effusions and atelectasis greater on LEFT new since previous exam.  Upper lungs clear.  No pneumothorax.  Bones unremarkable.  IMPRESSION: Bibasilar effusions and atelectasis LEFT greater than RIGHT new since 10/18/2014.   Electronically Signed   By: Lavonia Dana M.D.   On: 10/25/2014 07:17   Dg  Chest 2 View  10/18/2014   CLINICAL DATA:  Fatigue, fever, and weakness for 3 weeks. History of prostate cancer.  EXAM: CHEST  2 VIEW  COMPARISON:  08/08/2008  FINDINGS: The heart size and mediastinal contours are within normal limits. Both lungs are clear. The visualized skeletal structures are unremarkable.  IMPRESSION: No active cardiopulmonary disease.   Electronically Signed   By: Lucienne Capers M.D.   On: 10/18/2014 00:49   Mr Virgel Paling Wo Contrast  10/20/2014   CLINICAL DATA:  64 year old male with altered mental status, dizziness, loss of balance.  Fever, petechial rash, endocarditis. Initial encounter.  EXAM: MRI HEAD WITHOUT CONTRAST  MRA HEAD WITHOUT CONTRAST  TECHNIQUE: Multiplanar, multiecho pulse sequences of the brain and surrounding structures were obtained without intravenous contrast. Angiographic images of the head were obtained using MRA technique without contrast.  COMPARISON:  Head CT without contrast 08/08/2008.  FINDINGS: MRI HEAD FINDINGS  Wedge-shaped encephalomalacia in the anterior left middle temporal gyrus (series 9, image 19). 4-5 small foci of chronic appearing microhemorrhage, the largest in the left occipital pole on series 11, image 42. On diffusion there are scattered small foci of increased trace diffusion signal in both hemispheres. The majority year in the left MCA territory, but right MCA and right MCA/ PCA watershed territory is also affected. Furthermore, there are several similar small foci in the left cerebellar hemisphere. Some of these demonstrate facilitated diffusion on ADC, and T2 and FLAIR hyperintensity (lesion in the posterior left corona radiata on series 5, image 37). Some are restricted.  Deep gray matter nuclei and brainstem are spared. Major intracranial vascular flow voids are preserved with mild intracranial artery dolichoectasia.  No intracranial mass effect. No ventriculomegaly. Cerebral volume is within normal limits for age. Negative pituitary, cervicomedullary junction and visualized cervical spine.  Visible internal auditory structures appear normal. Trace left mastoid effusion. Paranasal sinuses and right mastoid are clear. Negative orbit and scalp.  Nonspecific decreased bone marrow signal at the skullbase and in the visualized cervical spine, patchy geographic pattern. More normal marrow signal in the calvarium.  MRA HEAD FINDINGS  Antegrade flow in the posterior circulation with dominant distal left vertebral artery. Mild distal left vertebral artery atherosclerosis without significant stenosis.  Patent vertebrobasilar junction. Normal left PICA. Dominant appearing right AICA. No basilar artery stenosis. Normal SCA and PCA origins. Small posterior communicating arteries. Normal bilateral PCA branches.  Antegrade flow in both ICA siphons. Evidence of calcified plaque without siphon stenosis. Tortuous but otherwise normal ophthalmic artery origins, more so the left. Patent carotid termini. Normal MCA and ACA origins. Normal posterior communicating artery origins.  Tortuous proximal ACA is. Diminutive or absent anterior communicating artery. Visualized bilateral ACA branches are within normal limits. Mildly tortuous bilateral MCAs. No MCA stenosis or branch occlusion identified. Overall mild generalized intracranial artery dolichoectasia.  IMPRESSION: 1. Scattered small acute to subacute, and occasional early chronic, lacunar infarcts in both MCA territories and the left cerebellum. None of these appear hemorrhagic (cerebral septic emboli are prone to bleed), but consider sequelae of endocarditis and septic emboli in this setting. No significant edema or mass effect. 2. Chronic anterior division left MCA infarct. A small number of chronic micro hemorrhages are identified in both hemispheres an the left cerebellum. 3. Mild for age intracranial atherosclerosis with no stenosis or branch occlusion identified. Mild intracranial artery dolichoectasia.   Electronically Signed   By: Genevie Ann M.D.   On: 10/20/2014 09:49   Mr [name] Contrast  10/20/2014   CLINICAL DATA:  64 year old male with altered mental status, dizziness, loss of balance. Fever, petechial rash, endocarditis. Initial encounter.  EXAM: MRI HEAD WITHOUT CONTRAST  MRA HEAD WITHOUT CONTRAST  TECHNIQUE: Multiplanar, multiecho pulse sequences of the brain and surrounding structures were obtained without intravenous contrast. Angiographic images of the head were obtained using MRA technique without contrast.  COMPARISON:  Head CT without contrast  08/08/2008.  FINDINGS: MRI HEAD FINDINGS  Wedge-shaped encephalomalacia in the anterior left middle temporal gyrus (series 9, image 19). 4-5 small foci of chronic appearing microhemorrhage, the largest in the left occipital pole on series 11, image 42. On diffusion there are scattered small foci of increased trace diffusion signal in both hemispheres. The majority year in the left MCA territory, but right MCA and right MCA/ PCA watershed territory is also affected. Furthermore, there are several similar small foci in the left cerebellar hemisphere. Some of these demonstrate facilitated diffusion on ADC, and T2 and FLAIR hyperintensity (lesion in the posterior left corona radiata on series 5, image 37). Some are restricted.  Deep gray matter nuclei and brainstem are spared. Major intracranial vascular flow voids are preserved with mild intracranial artery dolichoectasia.  No intracranial mass effect. No ventriculomegaly. Cerebral volume is within normal limits for age. Negative pituitary, cervicomedullary junction and visualized cervical spine.  Visible internal auditory structures appear normal. Trace left mastoid effusion. Paranasal sinuses and right mastoid are clear. Negative orbit and scalp.  Nonspecific decreased bone marrow signal at the skullbase and in the visualized cervical spine, patchy geographic pattern. More normal marrow signal in the calvarium.  MRA HEAD FINDINGS  Antegrade flow in the posterior circulation with dominant distal left vertebral artery. Mild distal left vertebral artery atherosclerosis without significant stenosis. Patent vertebrobasilar junction. Normal left PICA. Dominant appearing right AICA. No basilar artery stenosis. Normal SCA and PCA origins. Small posterior communicating arteries. Normal bilateral PCA branches.  Antegrade flow in both ICA siphons. Evidence of calcified plaque without siphon stenosis. Tortuous but otherwise normal ophthalmic artery origins, more so the left.  Patent carotid termini. Normal MCA and ACA origins. Normal posterior communicating artery origins.  Tortuous proximal ACA is. Diminutive or absent anterior communicating artery. Visualized bilateral ACA branches are within normal limits. Mildly tortuous bilateral MCAs. No MCA stenosis or branch occlusion identified. Overall mild generalized intracranial artery dolichoectasia.  IMPRESSION: 1. Scattered small acute to subacute, and occasional early chronic, lacunar infarcts in both MCA territories and the left cerebellum. None of these appear hemorrhagic (cerebral septic emboli are prone to bleed), but consider sequelae of endocarditis and septic emboli in this setting. No significant edema or mass effect. 2. Chronic anterior division left MCA infarct. A small number of chronic micro hemorrhages are identified in both hemispheres an the left cerebellum. 3. Mild for age intracranial atherosclerosis with no stenosis or branch occlusion identified. Mild intracranial artery dolichoectasia.   Electronically Signed   By: Genevie Ann M.D.   On: 10/20/2014 09:49   Mr Abdomen W Wo Contrast  10/24/2014   CLINICAL DATA:  Sepsis, splenic lesion. Concern for splenic infarction.  EXAM: MRI ABDOMEN WITHOUT AND WITH CONTRAST  TECHNIQUE: Multiplanar multisequence MR imaging of the abdomen was performed both before and after the administration of intravenous contrast.  CONTRAST:  76mL MULTIHANCE GADOBENATE DIMEGLUMINE 529 MG/ML IV SOLN  COMPARISON:  Ultrasound 10/23/2014  FINDINGS: Lower chest:  There is bibasilar atelectasis and small effusions.  Hepatobiliary: No focal hepatic lesion. No biliary duct dilatation. The gallbladder is  normal. Common bile duct is normal.  Pancreas: Normal pancreatic parenchymal intensity. No ductal dilatation or inflammation.  Spleen: There is a wedge-shaped geographic region of nonenhancement within the central spleen extending from the hilum to the cortex measuring 8.6 by 5.8 by 6.4 cm consistent with a  large midpole splenic infarction. No splenic hematoma or abscess identified.  Adrenals/urinary tract: Adrenal glands and kidneys are normal. There is a nonenhancing cysts within the cortex of the RIGHT kidney measuring 16 mm on series 1302.  Stomach/Bowel: Stomach and limited of the small bowel is unremarkable  Vascular/Lymphatic: Abdominal aorta normal caliber. No retroperitoneal periportal lymphadenopathy.  Musculoskeletal: No aggressive osseous lesion  IMPRESSION: 1. 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 2. No evidence of renal infection, pancreatitis, or hepatic abscess. 3. No cholelithiasis or biliary obstruction. 4. Bilateral pleural effusions and basilar atelectasis.   Electronically Signed   By: Suzy Bouchard M.D.   On: 10/24/2014 18:37   US Abdomen Complete  10/23/2014   CLINICAL DATA:  Patient with sepsis.  Anaerobic bacteremia.  EXAM: ULTRASOUND ABDOMEN COMPLETE  COMPARISON:  None.  FINDINGS: Gallbladder: No gallstones or wall thickening visualized. No sonographic Murphy sign noted.  Common bile duct: Diameter: 4 mm  Liver: No focal lesion identified. Within normal limits in parenchymal echogenicity.  IVC: No abnormality visualized.  Pancreas: Visualized portion unremarkable.  Spleen: The spleen is enlarged measuring 13.1 cm. There is a 4.8 x 5.6 x 5.2 cm hypoechoic region within the spleen. Small splenule.  Right Kidney: Length: 12.2 cm. Normal renal cortical thickness. No hydronephrosis. Increased renal cortical echogenicity. Within the interpolar region there is a 1.6 cm cyst.  Left Kidney: Length: 12 cm. No hydronephrosis. Normal renal cortical thickness. Increased renal cortical echogenicity.  Abdominal aorta: Not visualized due to overlying bowel gas.  Other findings: Bilateral pleural effusions.  IMPRESSION: Large irregular hypoechoic lesion within the spleen which is incompletely characterized on ultrasound. While splenic infarct is not excluded, this is favored to represent a  mass lesion. Given the size of the lesion and no priors for comparison, when patient clinically able, recommend evaluation with MRI.  Mild splenomegaly.  Increased renal cortical echogenicity as can be seen with chronic medical renal disease.  Bilateral pleural effusions.  These results will be called to the ordering clinician or representative by the Radiologist Assistant, and communication documented in the PACS or zVision Dashboard.   Electronically Signed   By: Lovey Newcomer M.D.   On: 10/23/2014 11:25      ASSESSMENT: SBE, AFib  PLAN:  Continue ABx for endocarditis.  Maintaining NSR on low dose metoprolol.    TCTS following as well with surgery plan.  Jettie Booze, MD  10/30/2014  9:49 AM

## 2014-10-30 NOTE — Progress Notes (Signed)
Patient ID: Alan [name], male   DOB: [birth year], 64 y.o.   MRN: [id number] [facility] PROGRESS NOTE  Alan [name] SAY:[id number] DOB: 1950-12-24 DOA: 10/17/2014 PCP: [name], MD  Subjective:   No changes clinically, still not able to sleep. We'll discontinue the Benadryl and had trazodone.  Brief narrative:    64 y.o. male with past medical history of prostate cancer, RLS who presented to [facility] with worsening symptoms of dizziness and gait instability. He had some mental status changes and was lethargic prior to admission for which reason his wife brought him in to ED. Pt has been working in a yard a lot and per wife she was concerned that he may have had a tick bit.  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 Patient has been seen by ID and cardiology in consultation. He is showing an improvement although slow on therapy with Omnipen and Rocephin.  Barrier to discharge: He will have aortic valve replacement and probable CABG next week.  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 Urine culture also grew enterococcus species. Repeat blood cultures on 10/20/2014 shows no growth to date. - MRI Alan on 10/20/2014 showed scattered small acute to subacute and occasional early chronic, lacunar infarcts in both MCA territories and the left cerebellum. consider sequelae of endocarditis and septic emboli in this setting.  - Pt is status post TEE which demonstrated mild MR with vegetation on atrial surface of anterior mitral leaflet as well as severe AR with large mobile vegetation on the non coronary cusp. ID was consulted and pt initially on vanco, gentamycin but currently on Omnipen and rocephin. So far tolerating this therapy well  with no signs of adverse reaction.  - Plan is for cardiac cath today in anticipation of valve surgery. - Continue to monitor in SDU due to acuity of medical issues at present.  - Of note, studies such as UDS, HIV Ab, RPR all normal. - Continue Rocephin and ampicillin -Per CTS recommendation aortic valve replacement with CABG next week.  Active Problems: Paroxysmal atrial fibrillation /Atrial flutter - CHADS vasc score at least 3 (Age, history of CVA) - So far rate relatively controlled - Appreciate cardiology following   Troponin elevation - Likely demand ischemia from acute septic emboli and bacteremia - Plan for cardiac cath today - Continue daily aspirin  History of  CVA  - As evidenced on MRI Alan, pt has chronic anterior division left MCA infarct and a small number of chronic micro hemorrhages are seen in both hemispheres in the left cerebellum - Continue daily aspirin - Appreciate physical therapy evaluation   Diarrhea - GI pathogen panel negative  Restless leg syndrome - Continue Pramipexole  Normocytic anemia - Hemoglobin stable at 10 - No current indications for transfusion - Colonoscopy showed no evidence of source of bacteremia  Dizziness - Likely combination of previous stroke and septic emboli - Appreciate PT evaluation   Splenic lesion - Ultrasound showed a questionable lesion, MRI showed questionable infarct.    DVT Prophylaxis  - SCD's bilaterally    Code Status: Full.  Family Communication:  plan of care discussed with the patient Disposition Plan:   IV access:  Peripheral IV  Procedures and diagnostic studies:    Dg Orthopantogram 10/21/2014 1. No fracture or bone lesion. No evidence of a dental abscess  or untreated carie.   Electronically Signed   By: Lajean Manes M.D.   On: 10/21/2014 10:09   Mr Alan [name] Head [name] Contrast 10/20/2014    1. Scattered small acute to subacute, and occasional early chronic, lacunar infarcts in both MCA territories and  the left cerebellum. None of these appear hemorrhagic (cerebral septic emboli are prone to bleed), but consider sequelae of endocarditis and septic emboli in this setting. No significant edema or mass effect. 2. Chronic anterior division left MCA infarct. A small number of chronic micro hemorrhages are identified in both hemispheres an the left cerebellum. 3. Mild for age intracranial atherosclerosis with no stenosis or branch occlusion identified. Mild intracranial artery dolichoectasia.   Electronically Signed   By: Genevie Ann M.D.   On: 10/20/2014 09:49   Mr Alan [name] Contrast 10/20/2014  Scattered small acute to subacute, and occasional early chronic, lacunar infarcts in both MCA territories and the left cerebellum. None of these appear hemorrhagic (cerebral septic emboli are prone to bleed), but consider sequelae of endocarditis and septic emboli in this setting. No significant edema or mass effect. 2. Chronic anterior division left MCA infarct. A small number of chronic micro hemorrhages are identified in both hemispheres an the left cerebellum. 3. Mild for age intracranial atherosclerosis with no stenosis or branch occlusion identified. Mild intracranial artery dolichoectasia.   Electronically Signed   By: Genevie Ann M.D.   On: 10/20/2014 09:49   US Abdomen Complete 10/23/2014   Large irregular hypoechoic lesion within the spleen which is incompletely characterized on ultrasound. While splenic infarct is not excluded, this is favored to represent a mass lesion. Given the size of the lesion and no priors for comparison, when patient clinically able, recommend evaluation with MRI.  Mild splenomegaly.  Increased renal cortical echogenicity as can be seen with chronic medical renal disease.  Bilateral pleural effusions.  These results will be called to the ordering clinician or representative by the Radiologist Assistant, and communication documented in the PACS or zVision Dashboard.   Electronically Signed   By: Lovey Newcomer M.D.   On: 10/23/2014 11:25   Dg Chest 2 View 10/18/2014 No active cardiopulmonary disease.   Electronically Signed   By: Lucienne Capers M.D.   On: 10/18/2014 00:49   Cardiac catheterization 10/23/2014   Medical Consultants:  Cardiology [facility] (Dr. Arta Silence) Infectious disease  Other Consultants:  Physical therapy  IAnti-Infectives:         Birdie Hopes, MD  [facility] Pager [phone]  Time spent in minutes: 25 minutes  If 7PM-7AM, please contact night-coverage [email or web address] Password [email or web address] 10/30/2014, 11:04 AM   LOS: 12 days     Objective: Filed Vitals:   10/29/14 0506 10/29/14 1424 10/29/14 2122 10/30/14 0553  BP: 121/66 103/66 110/60 113/58  Pulse: 91 99 75 87  Temp: 98.2 F (36.8 C) 98.1 F (36.7 C) 98.4 F (36.9 C) 98.2 F (36.8 C)  TempSrc: Oral Oral Oral Oral  Resp: 18 17 18 18   Height:      Weight:      SpO2: 97% 98% 96% 97%    Intake/Output Summary (Last 24 hours) at 10/30/14 1104 Last data filed at 10/30/14 0557  Gross per 24 hour  Intake    240 ml  Output    550 ml  Net   -310 ml    Exam:   General:  Pt is alert, follows commands appropriately, not in acute distress  Cardiovascular: tachycardic, S1/S2 appreciated  Respiratory: Clear to auscultation bilaterally, no wheezing, no crackles, no rhonchi  Abdomen: Soft, non tender, non distended, bowel sounds present  Extremities: No edema, pulses DP and PT palpable bilaterally  Neuro: Grossly nonfocal  Data Reviewed: Basic Metabolic Panel:  Recent Labs Lab 10/24/14 0150 10/25/14 0147  NA 130* 135  K 4.0 3.9  CL 98* 102  CO2 25 27  GLUCOSE 104* 102*  BUN 6 5*  CREATININE 0.97 1.04  CALCIUM 8.3* 8.6*   Liver Function Tests:  Recent Labs Lab 10/25/14 0147  AST 24  ALT 23  ALKPHOS 39  BILITOT 0.4  PROT 6.0*  ALBUMIN 1.8*   No results for input(s): LIPASE, AMYLASE in the last 168 hours. No results for input(s): AMMONIA in the last 168  hours. CBC:  Recent Labs Lab 10/24/14 0150  WBC 8.4  HGB 8.8*  HCT 27.8*  MCV 85.0  PLT 344   Cardiac Enzymes:  Recent Labs Lab 10/25/14 0147 10/25/14 0800 10/25/14 1534 10/25/14 2140 10/26/14 0353  TROPONINI 0.06* 0.06* 0.06* 0.06* 0.07*   BNP: Invalid input(s): POCBNP CBG:  Recent Labs Lab 10/26/14 0713 10/27/14 0523 10/28/14 0621 10/29/14 0626 10/30/14 0614  GLUCAP 119* 104* 106* 96 100*    Blood Culture (routine x 2)     Status: None   Collection Time: 10/18/14  1:11 AM  Result Value Ref Range Status   Specimen Description BLOOD RIGHT ANTECUBITAL  Final   Report Status 10/20/2014 FINAL  Final   Organism ID, Bacteria ENTEROCOCCUS SPECIES  Final      Susceptibility   Enterococcus species - MIC*    AMPICILLIN <=2 SENSITIVE Sensitive     VANCOMYCIN 1 SENSITIVE Sensitive     GENTAMICIN SYNERGY SENSITIVE Sensitive     LINEZOLID 2 SENSITIVE Sensitive     * ENTEROCOCCUS SPECIES  Blood Culture (routine x 2)     Status: None   Collection Time: 10/18/14  1:19 AM  Result Value Ref Range Status   Specimen Description BLOOD RIGHT WRIST  Final    ENTEROCOCCUS SPECIES    Report Status 10/20/2014 FINAL  Final  Urine culture     Status: None   Collection Time: 10/18/14  2:56 AM  Result Value Ref Range Status   Specimen Description URINE, CATHETERIZED  Final   Special Requests NONE  Final   Report Status 10/20/2014 FINAL  Final   Organism ID, Bacteria ENTEROCOCCUS FAECALIS  Final      Susceptibility   Enterococcus faecalis - MIC*    AMPICILLIN <=2 SENSITIVE Sensitive     LEVOFLOXACIN 1 SENSITIVE Sensitive     NITROFURANTOIN <=16 SENSITIVE Sensitive     VANCOMYCIN 1 SENSITIVE Sensitive     LINEZOLID 2 SENSITIVE Sensitive     * 20,000 COLONIES/mL ENTEROCOCCUS FAECALIS  MRSA PCR Screening     Status: None   Collection Time: 10/18/14  5:58 PM  Result Value Ref Range Status   MRSA by PCR NEGATIVE NEGATIVE Final  Culture, blood (routine x 2)     Status: None  (Preliminary result)   Collection Time: 10/20/14 11:10 AM  Result Value Ref Range Status   Specimen Description BLOOD RIGHT ARM  Final   Special Requests BOTTLES DRAWN AEROBIC AND ANAEROBIC 5CC  Final   Culture NO GROWTH 3 DAYS  Final   Report Status PENDING  Incomplete  Culture, blood (routine x 2)     Status: None (Preliminary result)   Collection Time: 10/20/14 11:26 AM  Result Value Ref Range  Status   Specimen Description BLOOD LEFT HAND  Final   Special Requests BOTTLES DRAWN AEROBIC AND ANAEROBIC 5CC  Final   Culture NO GROWTH 3 DAYS  Final   Report Status PENDING  Incomplete     Scheduled Meds: . ampicillin (OMNIPEN) IV  2 g Intravenous Q4H  . aspirin  81 mg Oral Daily  . cefTRIAXone (ROCEPHIN)  IV  2 g Intravenous Q12H  . chlorhexidine  15 mL Mouth/Throat BID  . diphenhydrAMINE  50 mg Oral QHS  . feeding supplement (ENSURE ENLIVE)  237 mL Oral TID BM  . metoprolol tartrate  12.5 mg Oral BID  . pramipexole  1.5 mg Oral QHS   Continuous Infusions:

## 2014-10-31 ENCOUNTER — Inpatient Hospital Stay (HOSPITAL_COMMUNITY): Payer: BC Managed Care – PPO

## 2014-10-31 DIAGNOSIS — I340 Nonrheumatic mitral (valve) insufficiency: Secondary | ICD-10-CM

## 2014-10-31 DIAGNOSIS — I39 Endocarditis and heart valve disorders in diseases classified elsewhere: Secondary | ICD-10-CM

## 2014-10-31 DIAGNOSIS — I351 Nonrheumatic aortic (valve) insufficiency: Secondary | ICD-10-CM

## 2014-10-31 DIAGNOSIS — K009 Disorder of tooth development, unspecified: Secondary | ICD-10-CM

## 2014-10-31 LAB — COMPREHENSIVE METABOLIC PANEL
ALT: 30 U/L (ref 17–63)
AST: 22 U/L (ref 15–41)
Albumin: 2.5 g/dL — ABNORMAL LOW (ref 3.5–5.0)
Alkaline Phosphatase: 57 U/L (ref 38–126)
Anion gap: 8 (ref 5–15)
BUN: 21 mg/dL — ABNORMAL HIGH (ref 6–20)
CO2: 29 mmol/L (ref 22–32)
Calcium: 9.8 mg/dL (ref 8.9–10.3)
Chloride: 98 mmol/L — ABNORMAL LOW (ref 101–111)
Creatinine, Ser: 1.27 mg/dL — ABNORMAL HIGH (ref 0.61–1.24)
GFR calc Af Amer: >60 mL/min (ref >60)
GFR calc non Af Amer: 57 mL/min — ABNORMAL LOW (ref >60)
Glucose, Bld: 112 mg/dL — ABNORMAL HIGH (ref 65–99)
Potassium: 4.6 mmol/L (ref 3.5–5.1)
Sodium: 135 mmol/L (ref 135–145)
Total Bilirubin: 0.2 mg/dL — ABNORMAL LOW (ref 0.3–1.2)
Total Protein: 7.7 g/dL (ref 6.5–8.1)

## 2014-10-31 LAB — CBC
HCT: 32.2 % — ABNORMAL LOW (ref 39.0–52.0)
Hemoglobin: 10 g/dL — ABNORMAL LOW (ref 13.0–17.0)
MCH: 26.5 pg (ref 26.0–34.0)
MCHC: 31.1 g/dL (ref 30.0–36.0)
MCV: 85.4 fL (ref 78.0–100.0)
Platelets: 593 10*3/uL — ABNORMAL HIGH (ref 150–400)
RBC: 3.77 MIL/uL — ABNORMAL LOW (ref 4.22–5.81)
RDW: 15 % (ref 11.5–15.5)
WBC: 8.2 10*3/uL (ref 4.0–10.5)

## 2014-10-31 LAB — PROTIME-INR
INR: 1.22 (ref 0.00–1.49)
Prothrombin Time: 15.6 seconds — ABNORMAL HIGH (ref 11.6–15.2)

## 2014-10-31 LAB — APTT: aPTT: 32 seconds (ref 24–37)

## 2014-10-31 LAB — PREALBUMIN: Prealbumin: 20.5 mg/dL (ref 18–38)

## 2014-10-31 LAB — GLUCOSE, CAPILLARY: Glucose-Capillary: 94 mg/dL (ref 65–99)

## 2014-10-31 MED ORDER — MAGNESIUM SULFATE 50 % IJ SOLN
40.0000 meq | INTRAMUSCULAR | Status: DC
  Filled 2014-10-31: qty 10

## 2014-10-31 MED ORDER — TEMAZEPAM 15 MG PO CAPS: 15.0000 mg | ORAL_CAPSULE | Freq: Once | ORAL | Status: DC | PRN

## 2014-10-31 MED ORDER — EPINEPHRINE HCL 1 MG/ML IJ SOLN
0.0000 ug/min | INTRAVENOUS | Status: DC
  Filled 2014-10-31: qty 4

## 2014-10-31 MED ORDER — DEXTROSE 5 % IV SOLN
750.0000 mg | INTRAVENOUS | Status: DC
  Filled 2014-10-31: qty 750

## 2014-10-31 MED ORDER — VANCOMYCIN HCL 1000 MG IV SOLR
INTRAVENOUS | Status: AC
  Administered 2014-11-01: 1000 mL
  Filled 2014-10-31: qty 1000

## 2014-10-31 MED ORDER — DOPAMINE-DEXTROSE 3.2-5 MG/ML-% IV SOLN
0.0000 ug/kg/min | INTRAVENOUS | Status: DC
  Filled 2014-10-31 (×2): qty 250

## 2014-10-31 MED ORDER — SODIUM CHLORIDE 0.9 % IV SOLN
INTRAVENOUS | Status: DC
  Filled 2014-10-31: qty 30

## 2014-10-31 MED ORDER — CHLORHEXIDINE GLUCONATE 4 % EX LIQD
60.0000 mL | Freq: Once | CUTANEOUS | Status: AC
  Administered 2014-10-31: 4 via TOPICAL
  Filled 2014-10-31: qty 15

## 2014-10-31 MED ORDER — DEXMEDETOMIDINE HCL IN NACL 400 MCG/100ML IV SOLN
0.1000 ug/kg/h | INTRAVENOUS | Status: AC
  Administered 2014-11-01: .3 ug/kg/h via INTRAVENOUS
  Filled 2014-10-31: qty 100

## 2014-10-31 MED ORDER — METOPROLOL TARTRATE 12.5 MG HALF TABLET
12.5000 mg | ORAL_TABLET | Freq: Once | ORAL | Status: AC
  Administered 2014-11-01: 12.5 mg via ORAL
  Filled 2014-10-31: qty 1

## 2014-10-31 MED ORDER — SODIUM CHLORIDE 0.9 % IV SOLN
INTRAVENOUS | Status: DC
  Administered 2014-10-31 (×2): via INTRAVENOUS

## 2014-10-31 MED ORDER — PLASMA-LYTE 148 IV SOLN
INTRAVENOUS | Status: AC
  Administered 2014-11-01: 500 mL
  Filled 2014-10-31: qty 2.5

## 2014-10-31 MED ORDER — AMINOCAPROIC ACID 250 MG/ML IV SOLN
INTRAVENOUS | Status: AC
  Administered 2014-11-01: 69.8 mL/h via INTRAVENOUS
  Filled 2014-10-31: qty 40

## 2014-10-31 MED ORDER — VANCOMYCIN HCL 10 G IV SOLR
1250.0000 mg | INTRAVENOUS | Status: AC
  Administered 2014-11-01: 1250 mg via INTRAVENOUS
  Filled 2014-10-31: qty 1250

## 2014-10-31 MED ORDER — DEXTROSE 5 % IV SOLN
1.5000 g | INTRAVENOUS | Status: AC
  Administered 2014-11-01: .75 g via INTRAVENOUS
  Administered 2014-11-01: 1.5 g via INTRAVENOUS
  Filled 2014-10-31 (×2): qty 1.5

## 2014-10-31 MED ORDER — POTASSIUM CHLORIDE 2 MEQ/ML IV SOLN
80.0000 meq | INTRAVENOUS | Status: DC
  Filled 2014-10-31: qty 40

## 2014-10-31 MED ORDER — CHLORHEXIDINE GLUCONATE 4 % EX LIQD
60.0000 mL | Freq: Once | CUTANEOUS | Status: AC
Start: 2014-11-01 — End: 2014-11-01
  Administered 2014-11-01: 4 via TOPICAL
  Filled 2014-10-31: qty 45

## 2014-10-31 MED ORDER — NITROGLYCERIN IN D5W 200-5 MCG/ML-% IV SOLN
2.0000 ug/min | INTRAVENOUS | Status: DC
Start: 2014-11-01 — End: 2014-11-01
  Filled 2014-10-31 (×2): qty 250

## 2014-10-31 MED ORDER — INSULIN REGULAR HUMAN 100 UNIT/ML IJ SOLN
INTRAMUSCULAR | Status: AC
  Administered 2014-11-01: .7 [IU]/h via INTRAVENOUS
  Filled 2014-10-31: qty 2.5

## 2014-10-31 MED ORDER — PHENYLEPHRINE HCL 10 MG/ML IJ SOLN
30.0000 ug/min | INTRAVENOUS | Status: AC
  Administered 2014-11-01: 20 ug/min via INTRAVENOUS
  Filled 2014-10-31: qty 2

## 2014-10-31 NOTE — Progress Notes (Signed)
Physical Therapy Treatment Patient Details Name: Alan [name] MRN: [id number] DOB: 1950/03/27 Today's Date: 10/31/2014    History of Present Illness 64 y.o. male with PMH of prostate cancer, RLS, who resents result in mental status, dizziness and rashes. Patient has been having dizziness and poor balance in the past 2-3 weeks. No unilateral weakness, vision change or hearing loss. Patient recently did a lot of yard work in [place], such as cutting trees. He could have had insect bite per his wife. Patient became confused and lethargic, and was found asleep by security guard at work (works at [employer]) at about 9:30 PM. He was also found to have fever with temperature 101.2 and petechial rashes over bilateral legs. Currently presents with acute encephalopathy.    PT Comments    Pt is progressing well towards physical therapy goals. Was limited by pain in scapular area and noted muscular tightness/trigger points in rhomboids. Pt felt relief with trigger point massage. Pt anticipating surgery tomorrow, and PT will reassess after.   Follow Up Recommendations  Other (comment) (Will re-assess needs post-op)     Equipment Recommendations  None recommended by PT    Recommendations for Other Services OT consult     Precautions / Restrictions Precautions Precautions: Fall Restrictions Weight Bearing Restrictions: No    Mobility  Bed Mobility Overal bed mobility: Modified Independent Bed Mobility: Supine to Sit              Transfers Overall transfer level: Modified independent Equipment used: None Transfers: Sit to/from Stand           General transfer comment: Stood x3 separate times throughout session with no assist required or unsteadiness noted.   Ambulation/Gait Ambulation/Gait assistance: Supervision Ambulation Distance (Feet): 600 Feet Assistive device: None Gait Pattern/deviations: Step-through pattern;Decreased stride length;Narrow base of  support   Gait velocity interpretation: at or above normal speed for age/gender General Gait Details: Supervision for safety. Pt frequently retracting shoulders and doing shoulder shrugs due to pain (indicates between shoulder blades). No assist required.    Stairs            Wheelchair Mobility    Modified Rankin (Stroke Patients Only)       Balance Overall balance assessment: Modified Independent                                  Cognition Arousal/Alertness: Awake/alert Behavior During Therapy: WFL for tasks assessed/performed Overall Cognitive Status: Within Functional Limits for tasks assessed                      Exercises Other Exercises Other Exercises: Therapist performed ~8 minutes of trigger point massage in rhomboids and upper traps for increased AROM and pain relief Other Exercises: Pt was instructed in scapular retractions and serratus punches. Noted that rhomboids appeared lax with tight pecs. Also noted that B scapulae appeared winged.     General Comments        Pertinent Vitals/Pain Pain Assessment: Faces Faces Pain Scale: Hurts even more Pain Location: Shoulders Pain Descriptors / Indicators: Grimacing;Guarding Pain Intervention(s): Limited activity within patient's tolerance;Monitored during session;Repositioned;Utilized relaxation techniques    Home Living                      Prior Function            PT Goals (current goals can now  be found in the care plan section) Acute Rehab PT Goals Patient Stated Goal: not stated PT Goal Formulation: With patient Time For Goal Achievement: 11/03/14 Potential to Achieve Goals: Good Progress towards PT goals: Progressing toward goals    Frequency  Min 3X/week    PT Plan Current plan remains appropriate    Co-evaluation             End of Session Equipment Utilized During Treatment: Gait belt Activity Tolerance: Patient tolerated treatment well Patient  left: in chair;with call bell/phone within reach     Time: 4469-5072 PT Time Calculation (min) (ACUTE ONLY): 24 min  Charges:  $Gait Training: 8-22 mins $Neuromuscular Re-education: 8-22 mins                    G Codes:      Rolinda Roan 2014-11-15, 1:07 PM   Rolinda Roan, PT, DPT Acute Rehabilitation Services Pager: [phone]

## 2014-10-31 NOTE — Progress Notes (Signed)
Utilization review completed.  

## 2014-10-31 NOTE — Care Management Note (Signed)
Case Management Note Marvetta Gibbons RN, BSN Unit 2W-Case Manager [phone]  Patient Details  Name: AMORY SIMONETTI MRN: [id number] Date of Birth: 08-28-1950  Subjective/Objective:    Pt admitted with enterococcal bacteremia- plan for AVR/CABG 11/01/14               Action/Plan: PTA pt lived at home- NCM to follow post op for d/c needs/planning  Expected Discharge Date:                  Expected Discharge Plan:  [place]  In-House Referral:     Discharge planning Services  CM Consult  Post Acute Care Choice:    Choice offered to:     DME Arranged:    DME Agency:     HH Arranged:    [place] Agency:     Status of Service:  In process, will continue to follow  Medicare Important Message Given:    Date Medicare IM Given:    Medicare IM give by:    Date Additional Medicare IM Given:    Additional Medicare Important Message give by:     If discussed at [place] of Stay Meetings, dates discussed:  10/31/14  Additional Comments:  Dawayne Patricia, RN 10/31/2014, 12:01 PM

## 2014-10-31 NOTE — Progress Notes (Signed)
Patient ID: Alan [name], male   DOB: 15-Oct-1950, 64 y.o.   MRN: [id number] [facility] PROGRESS NOTE  TEJ MURDAUGH [facility]:[id number] DOB: 1950-12-12 DOA: 10/17/2014 PCP: [name], MD  Subjective:   No changes clinically, creatinine and BUN increased slightly, placed on IV fluids, check BMP in am. Patient will have CABG with aortic valve replacement in a.m. Probably after that he will be in the cardiovascular ICU/stepdown.  Brief narrative:    64 y.o. male with past medical history of prostate cancer, RLS who presented to [facility] with worsening symptoms of dizziness and gait instability. He had some mental status changes and was lethargic prior to admission for which reason his wife brought him in to ED. Pt has been working in a yard a lot and per wife she was concerned that he may have had a tick bit.  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 Patient has been seen by ID and cardiology in consultation. He is showing an improvement although slow on therapy with Omnipen and Rocephin.  Barrier to discharge: He will have aortic valve replacement and probable CABG next week.  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 Urine culture also grew enterococcus species. Repeat blood cultures on 10/20/2014 shows no growth to date. - MRI brain on 10/20/2014 showed scattered small acute to subacute and occasional early chronic, lacunar infarcts in both MCA territories and the left cerebellum. consider sequelae of endocarditis and septic emboli in this setting.  - Pt is status post TEE which demonstrated mild MR with vegetation on atrial surface of anterior mitral leaflet as well as severe AR with large mobile vegetation on the non coronary cusp. ID  was consulted and pt initially on vanco, gentamycin but currently on Omnipen and rocephin. So far tolerating this therapy well with no signs of adverse reaction.  - Plan is for cardiac cath today in anticipation of valve surgery. - Continue to monitor in SDU due to acuity of medical issues at present.  - Of note, studies such as UDS, HIV Ab, RPR all normal. - Continue Rocephin and ampicillin -Per CTS recommendation aortic valve replacement with CABG next week.  Active Problems: Paroxysmal atrial fibrillation /Atrial flutter - CHADS vasc score at least 3 (Age, history of CVA) - So far rate relatively controlled - Appreciate cardiology following   Troponin elevation - Likely demand ischemia from acute septic emboli and bacteremia - Plan for cardiac cath today - Continue daily aspirin  History of  CVA  - As evidenced on MRI brain, pt has chronic anterior division left MCA infarct and a small number of chronic micro hemorrhages are seen in both hemispheres in the left cerebellum - Continue daily aspirin - Appreciate physical therapy evaluation   Diarrhea - GI pathogen panel negative  Restless leg syndrome - Continue Pramipexole  Normocytic anemia - Hemoglobin stable at 10 - No current indications for transfusion - Colonoscopy showed no evidence of source of bacteremia  Dizziness - Likely combination of previous stroke and septic emboli - Appreciate PT evaluation   Splenic lesion - Ultrasound showed a questionable lesion, MRI showed questionable infarct.    DVT Prophylaxis  - SCD's bilaterally    Code Status: Full.  Family Communication:  plan of care discussed with the patient Disposition Plan:   IV access:  Peripheral IV  Procedures  and diagnostic studies:    Dg Orthopantogram 10/21/2014 1. No fracture or bone lesion. No evidence of a dental abscess or untreated carie.   Electronically Signed   By: Lajean Manes M.D.   On: 10/21/2014 10:09   [name] Head Wo  Contrast 10/20/2014    1. Scattered small acute to subacute, and occasional early chronic, lacunar infarcts in both MCA territories and the left cerebellum. None of these appear hemorrhagic (cerebral septic emboli are prone to bleed), but consider sequelae of endocarditis and septic emboli in this setting. No significant edema or mass effect. 2. Chronic anterior division left MCA infarct. A small number of chronic micro hemorrhages are identified in both hemispheres an the left cerebellum. 3. Mild for age intracranial atherosclerosis with no stenosis or branch occlusion identified. Mild intracranial artery dolichoectasia.   Electronically Signed   By: Genevie Ann M.D.   On: 10/20/2014 09:49   Mr [name] Contrast 10/20/2014  Scattered small acute to subacute, and occasional early chronic, lacunar infarcts in both MCA territories and the left cerebellum. None of these appear hemorrhagic (cerebral septic emboli are prone to bleed), but consider sequelae of endocarditis and septic emboli in this setting. No significant edema or mass effect. 2. Chronic anterior division left MCA infarct. A small number of chronic micro hemorrhages are identified in both hemispheres an the left cerebellum. 3. Mild for age intracranial atherosclerosis with no stenosis or branch occlusion identified. Mild intracranial artery dolichoectasia.   Electronically Signed   By: Genevie Ann M.D.   On: 10/20/2014 09:49   US Abdomen Complete 10/23/2014   Large irregular hypoechoic lesion within the spleen which is incompletely characterized on ultrasound. While splenic infarct is not excluded, this is favored to represent a mass lesion. Given the size of the lesion and no priors for comparison, when patient clinically able, recommend evaluation with MRI.  Mild splenomegaly.  Increased renal cortical echogenicity as can be seen with chronic medical renal disease.  Bilateral pleural effusions.  These results will be called to the ordering clinician or  representative by the Radiologist Assistant, and communication documented in the PACS or zVision Dashboard.   Electronically Signed   By: Lovey Newcomer M.D.   On: 10/23/2014 11:25   Dg Chest 2 View 10/18/2014 No active cardiopulmonary disease.   Electronically Signed   By: Lucienne Capers M.D.   On: 10/18/2014 00:49   Cardiac catheterization 10/23/2014   Medical Consultants:  Cardiology [facility] (Dr. Arta Silence) Infectious disease  Other Consultants:  Physical therapy  IAnti-Infectives:         Birdie Hopes, MD  [facility] Pager [phone]  Time spent in minutes: 25 minutes  If 7PM-7AM, please contact night-coverage [email or web address] Password [email or web address] 10/31/2014, 12:59 PM   LOS: 13 days     Objective: Filed Vitals:   10/30/14 0553 10/30/14 1416 10/30/14 2116 10/31/14 0420  BP: 113/58 108/57 127/63 113/63  Pulse: 87 90 96 84  Temp: 98.2 F (36.8 C) 99.1 F (37.3 C) 98.1 F (36.7 C) 98.3 F (36.8 C)  TempSrc: Oral Oral Oral Oral  Resp: 18 17 16 16   Height:      Weight:      SpO2: 97% 96% 100% 96%    Intake/Output Summary (Last 24 hours) at 10/31/14 1259 Last data filed at 10/31/14 0730  Gross per 24 hour  Intake    240 ml  Output    877 ml  Net   -637 ml  Exam:   General:  Pt is alert, follows commands appropriately, not in acute distress  Cardiovascular: tachycardic, S1/S2 appreciated   Respiratory: Clear to auscultation bilaterally, no wheezing, no crackles, no rhonchi  Abdomen: Soft, non tender, non distended, bowel sounds present  Extremities: No edema, pulses DP and PT palpable bilaterally  Neuro: Grossly nonfocal  Data Reviewed: Basic Metabolic Panel:  Recent Labs Lab 10/25/14 0147 10/31/14 0413  NA 135 135  K 3.9 4.6  CL 102 98*  CO2 27 29  GLUCOSE 102* 112*  BUN 5* 21*  CREATININE 1.04 1.27*  CALCIUM 8.6* 9.8   Liver Function Tests:  Recent Labs Lab 10/25/14 0147 10/31/14 0413  AST 24 22  ALT 23 30  ALKPHOS 39  57  BILITOT 0.4 0.2*  PROT 6.0* 7.7  ALBUMIN 1.8* 2.5*   No results for input(s): LIPASE, AMYLASE in the last 168 hours. No results for input(s): AMMONIA in the last 168 hours. CBC:  Recent Labs Lab 10/31/14 0413  WBC 8.2  HGB 10.0*  HCT 32.2*  MCV 85.4  PLT 593*   Cardiac Enzymes:  Recent Labs Lab 10/25/14 0147 10/25/14 0800 10/25/14 1534 10/25/14 2140 10/26/14 0353  TROPONINI 0.06* 0.06* 0.06* 0.06* 0.07*   BNP: Invalid input(s): POCBNP CBG:  Recent Labs Lab 10/27/14 0523 10/28/14 0621 10/29/14 0626 10/30/14 0614 10/31/14 0610  GLUCAP 104* 106* 96 100* 94    Blood Culture (routine x 2)     Status: None   Collection Time: 10/18/14  1:11 AM  Result Value Ref Range Status   Specimen Description BLOOD RIGHT ANTECUBITAL  Final   Report Status 10/20/2014 FINAL  Final   Organism ID, Bacteria ENTEROCOCCUS SPECIES  Final      Susceptibility   Enterococcus species - MIC*    AMPICILLIN <=2 SENSITIVE Sensitive     VANCOMYCIN 1 SENSITIVE Sensitive     GENTAMICIN SYNERGY SENSITIVE Sensitive     LINEZOLID 2 SENSITIVE Sensitive     * ENTEROCOCCUS SPECIES  Blood Culture (routine x 2)     Status: None   Collection Time: 10/18/14  1:19 AM  Result Value Ref Range Status   Specimen Description BLOOD RIGHT WRIST  Final    ENTEROCOCCUS SPECIES    Report Status 10/20/2014 FINAL  Final  Urine culture     Status: None   Collection Time: 10/18/14  2:56 AM  Result Value Ref Range Status   Specimen Description URINE, CATHETERIZED  Final   Special Requests NONE  Final   Report Status 10/20/2014 FINAL  Final   Organism ID, Bacteria ENTEROCOCCUS FAECALIS  Final      Susceptibility   Enterococcus faecalis - MIC*    AMPICILLIN <=2 SENSITIVE Sensitive     LEVOFLOXACIN 1 SENSITIVE Sensitive     NITROFURANTOIN <=16 SENSITIVE Sensitive     VANCOMYCIN 1 SENSITIVE Sensitive     LINEZOLID 2 SENSITIVE Sensitive     * 20,000 COLONIES/mL ENTEROCOCCUS FAECALIS  MRSA PCR Screening      Status: None   Collection Time: 10/18/14  5:58 PM  Result Value Ref Range Status   MRSA by PCR NEGATIVE NEGATIVE Final  Culture, blood (routine x 2)     Status: None (Preliminary result)   Collection Time: 10/20/14 11:10 AM  Result Value Ref Range Status   Specimen Description BLOOD RIGHT ARM  Final   Special Requests BOTTLES DRAWN AEROBIC AND ANAEROBIC 5CC  Final   Culture NO GROWTH 3 DAYS  Final   Report  Status PENDING  Incomplete  Culture, blood (routine x 2)     Status: None (Preliminary result)   Collection Time: 10/20/14 11:26 AM  Result Value Ref Range Status   Specimen Description BLOOD LEFT HAND  Final   Special Requests BOTTLES DRAWN AEROBIC AND ANAEROBIC 5CC  Final   Culture NO GROWTH 3 DAYS  Final   Report Status PENDING  Incomplete     Scheduled Meds: . ampicillin (OMNIPEN) IV  2 g Intravenous Q4H  . aspirin  81 mg Oral Daily  . cefTRIAXone (ROCEPHIN)  IV  2 g Intravenous Q12H  . chlorhexidine  15 mL Mouth/Throat BID  . feeding supplement (ENSURE ENLIVE)  237 mL Oral TID BM  . metoprolol tartrate  12.5 mg Oral BID  . pramipexole  1.5 mg Oral QHS  . traZODone  50 mg Oral QHS   Continuous Infusions: . sodium chloride 100 mL/hr at 10/31/14 3825

## 2014-10-31 NOTE — Progress Notes (Signed)
      [address]Suite [address]       [place] [address]             [phone]     CARDIOTHORACIC SURGERY PROGRESS NOTE  6 Days Post-Op  S/P Procedure(s) (LRB): COLONOSCOPY (N/A)  Subjective: Feels well.  No SOB.    Objective: Vital signs in last 24 hours: Temp:  [98.1 F (36.7 C)-99.1 F (37.3 C)] 98.3 F (36.8 C) (09/06 0420) Pulse Rate:  [84-96] 84 (09/06 0420) Cardiac Rhythm:  [-] Normal sinus rhythm (09/06 0713) Resp:  [16-17] 16 (09/06 0420) BP: (108-127)/(57-63) 113/63 mmHg (09/06 0420) SpO2:  [96 %-100 %] 96 % (09/06 0420)  Physical Exam:  Rhythm:   sinus  Breath sounds: clear  Heart sounds:  RRR w/ soft systolic murmur  Incisions:  n/a  Abdomen:  soft  Extremities:  warm   Intake/Output from previous day: 09/05 0701 - 09/06 0700 In: -  Out: 327 [Urine:325; Stool:2] Intake/Output this shift: Total I/O In: 240 [P.O.:240] Out: 550 [Urine:550]  Lab Results:  Recent Labs  10/31/14 0413  WBC 8.2  HGB 10.0*  HCT 32.2*  PLT 593*   BMET:  Recent Labs  10/31/14 0413  NA 135  K 4.6  CL 98*  CO2 29  GLUCOSE 112*  BUN 21*  CREATININE 1.27*  CALCIUM 9.8    CBG (last 3)   Recent Labs  10/29/14 0626 10/30/14 0614 10/31/14 0610  GLUCAP 96 100* 94   PT/INR:   Recent Labs  10/31/14 0413  LABPROT 15.6*  INR 1.22    CXR:  CHEST 2 VIEW  COMPARISON: PA and lateral chest x-ray of October 25, 2014  FINDINGS: The lungs are adequately inflated. The retro cardiac atelectasis or infiltrate on the left has nearly totally cleared. There is a trace left pleural effusion remaining. The right lung is clear. The heart and pulmonary vascularity are normal. The bony thorax exhibits no acute abnormality.  IMPRESSION: Marked interval improvement at the left lung base. A small infiltrate persists in the posterior aspect of the lower lobe associated with a tiny pleural effusion.   Electronically Signed  By: David Martinique M.D.  On:  10/31/2014 07:29   Assessment/Plan: S/P Procedure(s) (LRB): COLONOSCOPY (N/A)  The patient was again counseled at length regarding the indications, risks and potential benefits of surgery.  Alternative treatment strategies were discussed. They understand and accept all potential risks of surgery including but not limited to risk of death, stroke or other neurologic complication, myocardial infarction, congestive heart failure, respiratory failure, renal failure, bleeding requiring transfusion and/or reexploration, arrhythmia, infection or other wound complications, pneumonia, pleural and/or pericardial effusion, pulmonary embolus, aortic dissection or other major vascular complication, or delayed complications related to valve repair or replacement including but not limited to structural valve deterioration and failure, thrombosis, embolization, endocarditis, paravalvular leak or late recurrence of symptomatic ischemic heart disease.  All of his questions have been answered.  For OR in am tomorrow.  I spent in excess of 15 minutes during the conduct of this hospital encounter and >50% of this time involved direct face-to-face encounter with the patient for counseling and/or coordination of their care.    Rexene Alberts 10/31/2014 1:06 PM

## 2014-10-31 NOTE — Progress Notes (Signed)
Patient Name: Alan [name] Date of Encounter: 10/31/2014   SUBJECTIVE  Doing well. No chest pain, SOB or palpitation.   CURRENT MEDS . ampicillin (OMNIPEN) IV  2 g Intravenous Q4H  . aspirin  81 mg Oral Daily  . cefTRIAXone (ROCEPHIN)  IV  2 g Intravenous Q12H  . chlorhexidine  15 mL Mouth/Throat BID  . feeding supplement (ENSURE ENLIVE)  237 mL Oral TID BM  . metoprolol tartrate  12.5 mg Oral BID  . pramipexole  1.5 mg Oral QHS  . traZODone  50 mg Oral QHS    OBJECTIVE  Filed Vitals:   10/30/14 0553 10/30/14 1416 10/30/14 2116 10/31/14 0420  BP: 113/58 108/57 127/63 113/63  Pulse: 87 90 96 84  Temp: 98.2 F (36.8 C) 99.1 F (37.3 C) 98.1 F (36.7 C) 98.3 F (36.8 C)  TempSrc: Oral Oral Oral Oral  Resp: 18 17 16 16   Height:      Weight:      SpO2: 97% 96% 100% 96%    Intake/Output Summary (Last 24 hours) at 10/31/14 0812 Last data filed at 10/31/14 0442  Gross per 24 hour  Intake      0 ml  Output    327 ml  Net   -327 ml   Filed Weights   10/24/14 0330 10/25/14 0152 10/28/14 0233  Weight: 168 lb 6.9 oz (76.4 kg) 167 lb 5.3 oz (75.9 kg) 156 lb 1.4 oz (70.8 kg)    PHYSICAL EXAM  General: Pleasant, NAD. Neuro: Alert and oriented X 3. Moves all extremities spontaneously. Psych: Normal affect. HEENT:  Normal  Neck: Supple without bruits or JVD. Lungs:  Resp regular and unlabored, CTA. Heart: RRR no s3, s4. 3-5/0 systolic murmurs. Abdomen: Soft, non-tender, non-distended, BS + x 4.  Extremities: No clubbing, cyanosis or edema. DP/PT/Radials 2+ and equal bilaterally.  Accessory Clinical Findings  CBC  Recent Labs  10/31/14 0413  WBC 8.2  HGB 10.0*  HCT 32.2*  MCV 85.4  PLT 093*   Basic Metabolic Panel  Recent Labs  10/31/14 0413  NA 135  K 4.6  CL 98*  CO2 29  GLUCOSE 112*  BUN 21*  CREATININE 1.27*  CALCIUM 9.8   Liver Function Tests  Recent Labs  10/31/14 0413  AST 22  ALT 30  ALKPHOS 63  BILITOT 0.2*  PROT 7.7    ALBUMIN 2.5*    TELE  NSR    ASSESSMENT AND PLAN Principal Problem:   Enterococcal bacteremia Active Problems:   Restless leg syndrome   Prostate cancer   Acute encephalopathy   Rash   Sepsis   Normocytic anemia   Dizziness   Acute respiratory failure   Aortic valve endocarditis - Enterococcus   Endocarditis of mitral valve - Enterococcus   Paroxysmal atrial fibrillation   Paroxysmal atrial flutter   Atherosclerotic peripheral vascular disease   Aortic valve insufficiency, severe   Mitral valve regurgitation, infectious   Septic embolism   Leukocytosis   Troponin level elevated   Endocarditis   Splenic infarction   Protein-calorie malnutrition, severe   Coronary artery disease involving native coronary artery without angina pectoris   Stroke, embolic   Subacute endocarditis    Plan: Felling well. Surgery tomorrow. Maintaining NSR with rate controlled to 70-80s. Continue Metoprolol.  - Cr 1.27 today. Monitor closely. BP stable.   Signed, Bhagat,Bhavinkumar PA-C   As above; patient seen and examined; no CP or dyspnea; remains in sinus; plan for AVR/MV repair/CABG  in AM; continue antibiotics. Alan [name]

## 2014-10-31 NOTE — Progress Notes (Signed)
CARDIAC REHAB PHASE I   PRE:  Rate/Rhythm: 98 SR  BP:  Sitting: 116/67        SaO2: 100 RA  MODE:  Ambulation: 1450 ft   POST:  Rate/Rhythm: 96 SR  BP:  Sitting: 125/58         SaO2: 99 RA  Pt up ad lib in room, ambulated 1450 ft on RA, IV, standby assist, steady gait, tolerated well. Pt denies CP, dizziness, DOE, declined rest stop. Pt states it is hard for him to relax today. Pt to recliner after walk, feet elevated, call bell within reach. Encouraged IS, pt verbalized understanding. Will follow-up post-op.  [phone]  Lenna Sciara, RN, BSN 10/31/2014 3:23 PM

## 2014-11-01 ENCOUNTER — Inpatient Hospital Stay (HOSPITAL_COMMUNITY): Payer: BC Managed Care – PPO | Admitting: Certified Registered"

## 2014-11-01 ENCOUNTER — Encounter (HOSPITAL_COMMUNITY): Payer: Self-pay | Admitting: Thoracic Surgery (Cardiothoracic Vascular Surgery)

## 2014-11-01 ENCOUNTER — Inpatient Hospital Stay (HOSPITAL_COMMUNITY): Payer: BC Managed Care – PPO

## 2014-11-01 ENCOUNTER — Encounter (HOSPITAL_COMMUNITY)
Admission: EM | Disposition: A | Discharge status: Home Health | Payer: Medicare Other | Source: Home / Self Care | Attending: Thoracic Surgery (Cardiothoracic Vascular Surgery)

## 2014-11-01 DIAGNOSIS — I2690 Septic pulmonary embolism without acute cor pulmonale: Secondary | ICD-10-CM

## 2014-11-01 DIAGNOSIS — G2581 Restless legs syndrome: Secondary | ICD-10-CM

## 2014-11-01 DIAGNOSIS — I25811 Atherosclerosis of native coronary artery of transplanted heart without angina pectoris: Secondary | ICD-10-CM

## 2014-11-01 DIAGNOSIS — Z9889 Other specified postprocedural states: Secondary | ICD-10-CM

## 2014-11-01 DIAGNOSIS — I2510 Atherosclerotic heart disease of native coronary artery without angina pectoris: Secondary | ICD-10-CM

## 2014-11-01 DIAGNOSIS — Z951 Presence of aortocoronary bypass graft: Secondary | ICD-10-CM

## 2014-11-01 DIAGNOSIS — Z953 Presence of xenogenic heart valve: Secondary | ICD-10-CM

## 2014-11-01 HISTORY — DX: Other specified postprocedural states: Z98.890

## 2014-11-01 HISTORY — DX: Presence of aortocoronary bypass graft: Z95.1

## 2014-11-01 HISTORY — PX: AORTIC VALVE REPLACEMENT: SHX41

## 2014-11-01 HISTORY — PX: MITRAL VALVE REPAIR: SHX2039

## 2014-11-01 HISTORY — DX: Presence of xenogenic heart valve: Z95.3

## 2014-11-01 HISTORY — PX: CORONARY ARTERY BYPASS GRAFT: SHX141

## 2014-11-01 HISTORY — PX: TEE WITHOUT CARDIOVERSION: SHX5443

## 2014-11-01 HISTORY — PX: AORTIC ROOT ENLARGEMENT: SHX6346

## 2014-11-01 LAB — POCT I-STAT, CHEM 8
BUN: 15 mg/dL (ref 6–20)
BUN: 13 mg/dL (ref 6–20)
BUN: 14 mg/dL (ref 6–20)
BUN: 13 mg/dL (ref 6–20)
BUN: 13 mg/dL (ref 6–20)
BUN: 14 mg/dL (ref 6–20)
BUN: 13 mg/dL (ref 6–20)
BUN: 14 mg/dL (ref 6–20)
BUN: 14 mg/dL (ref 6–20)
CALCIUM ION: 1.05 mmol/L — AB (ref 1.13–1.30)
CALCIUM ION: 1.42 mmol/L — AB (ref 1.13–1.30)
CHLORIDE: 102 mmol/L (ref 101–111)
CHLORIDE: 100 mmol/L — AB (ref 101–111)
CHLORIDE: 100 mmol/L — AB (ref 101–111)
CHLORIDE: 99 mmol/L — AB (ref 101–111)
CHLORIDE: 101 mmol/L (ref 101–111)
CHLORIDE: 110 mmol/L (ref 101–111)
CREATININE: 0.9 mg/dL (ref 0.61–1.24)
CREATININE: 0.8 mg/dL (ref 0.61–1.24)
CREATININE: 0.8 mg/dL (ref 0.61–1.24)
CREATININE: 0.8 mg/dL (ref 0.61–1.24)
CREATININE: 0.8 mg/dL (ref 0.61–1.24)
CREATININE: 0.8 mg/dL (ref 0.61–1.24)
Calcium, Ion: 1.3 mmol/L (ref 1.13–1.30)
Calcium, Ion: 1.33 mmol/L — ABNORMAL HIGH (ref 1.13–1.30)
Calcium, Ion: 1.16 mmol/L (ref 1.13–1.30)
Calcium, Ion: 1.22 mmol/L (ref 1.13–1.30)
Calcium, Ion: 1.24 mmol/L (ref 1.13–1.30)
Calcium, Ion: 1.69 mmol/L — ABNORMAL HIGH (ref 1.13–1.30)
Chloride: 101 mmol/L (ref 101–111)
Chloride: 103 mmol/L (ref 101–111)
Chloride: 105 mmol/L (ref 101–111)
Creatinine, Ser: 0.7 mg/dL (ref 0.61–1.24)
Creatinine, Ser: 0.9 mg/dL (ref 0.61–1.24)
Creatinine, Ser: 0.9 mg/dL (ref 0.61–1.24)
GLUCOSE: 110 mg/dL — AB (ref 65–99)
GLUCOSE: 139 mg/dL — AB (ref 65–99)
GLUCOSE: 138 mg/dL — AB (ref 65–99)
GLUCOSE: 110 mg/dL — AB (ref 65–99)
GLUCOSE: 112 mg/dL — AB (ref 65–99)
GLUCOSE: 120 mg/dL — AB (ref 65–99)
Glucose, Bld: 97 mg/dL (ref 65–99)
Glucose, Bld: 123 mg/dL — ABNORMAL HIGH (ref 65–99)
Glucose, Bld: 141 mg/dL — ABNORMAL HIGH (ref 65–99)
HCT: 26 % — ABNORMAL LOW (ref 39.0–52.0)
HCT: 24 % — ABNORMAL LOW (ref 39.0–52.0)
HCT: 26 % — ABNORMAL LOW (ref 39.0–52.0)
HCT: 21 % — ABNORMAL LOW (ref 39.0–52.0)
HCT: 23 % — ABNORMAL LOW (ref 39.0–52.0)
HEMATOCRIT: 24 % — AB (ref 39.0–52.0)
HEMATOCRIT: 25 % — AB (ref 39.0–52.0)
HEMATOCRIT: 24 % — AB (ref 39.0–52.0)
HEMATOCRIT: 24 % — AB (ref 39.0–52.0)
HEMOGLOBIN: 8.8 g/dL — AB (ref 13.0–17.0)
HEMOGLOBIN: 8.2 g/dL — AB (ref 13.0–17.0)
HEMOGLOBIN: 7.8 g/dL — AB (ref 13.0–17.0)
Hemoglobin: 8.2 g/dL — ABNORMAL LOW (ref 13.0–17.0)
Hemoglobin: 8.5 g/dL — ABNORMAL LOW (ref 13.0–17.0)
Hemoglobin: 8.2 g/dL — ABNORMAL LOW (ref 13.0–17.0)
Hemoglobin: 8.8 g/dL — ABNORMAL LOW (ref 13.0–17.0)
Hemoglobin: 7.1 g/dL — ABNORMAL LOW (ref 13.0–17.0)
Hemoglobin: 8.2 g/dL — ABNORMAL LOW (ref 13.0–17.0)
POTASSIUM: 3.9 mmol/L (ref 3.5–5.1)
POTASSIUM: 4.4 mmol/L (ref 3.5–5.1)
POTASSIUM: 4.9 mmol/L (ref 3.5–5.1)
POTASSIUM: 5.1 mmol/L (ref 3.5–5.1)
Potassium: 4.1 mmol/L (ref 3.5–5.1)
Potassium: 3.8 mmol/L (ref 3.5–5.1)
Potassium: 4.4 mmol/L (ref 3.5–5.1)
Potassium: 3.7 mmol/L (ref 3.5–5.1)
Potassium: 3.9 mmol/L (ref 3.5–5.1)
SODIUM: 129 mmol/L — AB (ref 135–145)
SODIUM: 134 mmol/L — AB (ref 135–145)
SODIUM: 133 mmol/L — AB (ref 135–145)
SODIUM: 135 mmol/L (ref 135–145)
Sodium: 133 mmol/L — ABNORMAL LOW (ref 135–145)
Sodium: 133 mmol/L — ABNORMAL LOW (ref 135–145)
Sodium: 134 mmol/L — ABNORMAL LOW (ref 135–145)
Sodium: 137 mmol/L (ref 135–145)
Sodium: 136 mmol/L (ref 135–145)
TCO2: 28 mmol/L (ref 0–100)
TCO2: 27 mmol/L (ref 0–100)
TCO2: 27 mmol/L (ref 0–100)
TCO2: 26 mmol/L (ref 0–100)
TCO2: 27 mmol/L (ref 0–100)
TCO2: 26 mmol/L (ref 0–100)
TCO2: 23 mmol/L (ref 0–100)
TCO2: 25 mmol/L (ref 0–100)
TCO2: 20 mmol/L (ref 0–100)

## 2014-11-01 LAB — PROTIME-INR
INR: 1.73 — ABNORMAL HIGH (ref 0.00–1.49)
INR: 1.57 — ABNORMAL HIGH (ref 0.00–1.49)
Prothrombin Time: 20.2 seconds — ABNORMAL HIGH (ref 11.6–15.2)
Prothrombin Time: 18.8 seconds — ABNORMAL HIGH (ref 11.6–15.2)

## 2014-11-01 LAB — POCT I-STAT 3, ART BLOOD GAS (G3+)
ACID-BASE DEFICIT: 2 mmol/L (ref 0.0–2.0)
ACID-BASE DEFICIT: 1 mmol/L (ref 0.0–2.0)
Acid-Base Excess: 2 mmol/L (ref 0.0–2.0)
Acid-base deficit: 4 mmol/L — ABNORMAL HIGH (ref 0.0–2.0)
BICARBONATE: 21.3 meq/L (ref 20.0–24.0)
Bicarbonate: 27.1 mEq/L — ABNORMAL HIGH (ref 20.0–24.0)
Bicarbonate: 24.4 mEq/L — ABNORMAL HIGH (ref 20.0–24.0)
Bicarbonate: 24.5 mEq/L — ABNORMAL HIGH (ref 20.0–24.0)
O2 SAT: 100 %
O2 SAT: 97 %
O2 Saturation: 100 %
O2 Saturation: 98 %
PCO2 ART: 43.5 mmHg (ref 35.0–45.0)
PH ART: 7.403 (ref 7.350–7.450)
PH ART: 7.397 (ref 7.350–7.450)
PH ART: 7.37 (ref 7.350–7.450)
PO2 ART: 307 mmHg — AB (ref 80.0–100.0)
PO2 ART: 244 mmHg — AB (ref 80.0–100.0)
Patient temperature: 35.7
TCO2: 28 mmol/L (ref 0–100)
TCO2: 26 mmol/L (ref 0–100)
TCO2: 26 mmol/L (ref 0–100)
TCO2: 22 mmol/L (ref 0–100)
pCO2 arterial: 47 mmHg — ABNORMAL HIGH (ref 35.0–45.0)
pCO2 arterial: 39.3 mmHg (ref 35.0–45.0)
pCO2 arterial: 36.9 mmHg (ref 35.0–45.0)
pH, Arterial: 7.323 — ABNORMAL LOW (ref 7.350–7.450)
pO2, Arterial: 84 mmHg (ref 80.0–100.0)
pO2, Arterial: 108 mmHg — ABNORMAL HIGH (ref 80.0–100.0)

## 2014-11-01 LAB — POCT I-STAT 4, (NA,K, GLUC, HGB,HCT)
GLUCOSE: 77 mg/dL (ref 65–99)
HCT: 26 % — ABNORMAL LOW (ref 39.0–52.0)
HEMOGLOBIN: 8.8 g/dL — AB (ref 13.0–17.0)
POTASSIUM: 4.2 mmol/L (ref 3.5–5.1)
SODIUM: 136 mmol/L (ref 135–145)

## 2014-11-01 LAB — APTT
aPTT: 36 seconds (ref 24–37)
aPTT: 38 seconds — ABNORMAL HIGH (ref 24–37)

## 2014-11-01 LAB — CBC
HEMATOCRIT: 27.1 % — AB (ref 39.0–52.0)
HEMATOCRIT: 24.5 % — AB (ref 39.0–52.0)
HEMOGLOBIN: 8.8 g/dL — AB (ref 13.0–17.0)
Hemoglobin: 7.7 g/dL — ABNORMAL LOW (ref 13.0–17.0)
MCH: 27.4 pg (ref 26.0–34.0)
MCH: 26.7 pg (ref 26.0–34.0)
MCHC: 32.5 g/dL (ref 30.0–36.0)
MCHC: 31.4 g/dL (ref 30.0–36.0)
MCV: 84.4 fL (ref 78.0–100.0)
MCV: 85.1 fL (ref 78.0–100.0)
PLATELETS: 258 10*3/uL (ref 150–400)
Platelets: 302 10*3/uL (ref 150–400)
RBC: 3.21 MIL/uL — AB (ref 4.22–5.81)
RBC: 2.88 MIL/uL — ABNORMAL LOW (ref 4.22–5.81)
RDW: 14.7 % (ref 11.5–15.5)
RDW: 15 % (ref 11.5–15.5)
WBC: 14.7 10*3/uL — ABNORMAL HIGH (ref 4.0–10.5)
WBC: 10 10*3/uL (ref 4.0–10.5)

## 2014-11-01 LAB — CREATININE, SERUM
CREATININE: 1 mg/dL (ref 0.61–1.24)
GFR calc Af Amer: >60 mL/min (ref >60)

## 2014-11-01 LAB — PLATELET COUNT
Platelets: 400 10*3/uL (ref 150–400)
Platelets: 291 10*3/uL (ref 150–400)

## 2014-11-01 LAB — GLUCOSE, CAPILLARY: GLUCOSE-CAPILLARY: 99 mg/dL (ref 65–99)

## 2014-11-01 LAB — COMPREHENSIVE METABOLIC PANEL
ALT: 25 U/L (ref 17–63)
AST: 20 U/L (ref 15–41)
Albumin: 2.4 g/dL — ABNORMAL LOW (ref 3.5–5.0)
Alkaline Phosphatase: 51 U/L (ref 38–126)
Anion gap: 7 (ref 5–15)
BUN: 17 mg/dL (ref 6–20)
CHLORIDE: 97 mmol/L — AB (ref 101–111)
CO2: 28 mmol/L (ref 22–32)
CREATININE: 1.29 mg/dL — AB (ref 0.61–1.24)
Calcium: 9.2 mg/dL (ref 8.9–10.3)
GFR calc Af Amer: >60 mL/min (ref >60)
GFR, EST NON AFRICAN AMERICAN: 56 mL/min — AB (ref >60)
Glucose, Bld: 109 mg/dL — ABNORMAL HIGH (ref 65–99)
POTASSIUM: 4.4 mmol/L (ref 3.5–5.1)
SODIUM: 132 mmol/L — AB (ref 135–145)
Total Bilirubin: 0.1 mg/dL — ABNORMAL LOW (ref 0.3–1.2)
Total Protein: 6.9 g/dL (ref 6.5–8.1)

## 2014-11-01 LAB — FIBRINOGEN: FIBRINOGEN: 313 mg/dL (ref 204–475)

## 2014-11-01 LAB — MAGNESIUM: Magnesium: 2.9 mg/dL — ABNORMAL HIGH (ref 1.7–2.4)

## 2014-11-01 LAB — HEMOGLOBIN AND HEMATOCRIT, BLOOD
HEMATOCRIT: 24.5 % — AB (ref 39.0–52.0)
HEMOGLOBIN: 7.9 g/dL — AB (ref 13.0–17.0)

## 2014-11-01 LAB — PREPARE RBC (CROSSMATCH)

## 2014-11-01 SURGERY — REPAIR, MITRAL VALVE
Anesthesia: General | Site: Chest

## 2014-11-01 MED ORDER — SODIUM CHLORIDE 0.9 % IV SOLN
INTRAVENOUS | Status: DC
  Filled 2014-11-01: qty 20

## 2014-11-01 MED ORDER — MIDAZOLAM HCL 5 MG/5ML IJ SOLN
INTRAMUSCULAR | Status: DC | PRN
  Administered 2014-11-01: 3 mg via INTRAVENOUS
  Administered 2014-11-01: 1 mg via INTRAVENOUS
  Administered 2014-11-01: 2 mg via INTRAVENOUS

## 2014-11-01 MED ORDER — ASPIRIN 81 MG PO CHEW: 324.0000 mg | CHEWABLE_TABLET | Freq: Every day | ORAL | Status: DC

## 2014-11-01 MED ORDER — SODIUM CHLORIDE 0.9 % IR SOLN
Status: DC | PRN
  Administered 2014-11-01: 3000 mL

## 2014-11-01 MED ORDER — LACTATED RINGERS IV SOLN
INTRAVENOUS | Status: DC
  Administered 2014-11-01: 18:00:00 via INTRAVENOUS

## 2014-11-01 MED ORDER — MORPHINE SULFATE (PF) 2 MG/ML IV SOLN
2.0000 mg | INTRAVENOUS | Status: DC | PRN
  Administered 2014-11-01 – 2014-11-02 (×2): 2 mg via INTRAVENOUS
  Filled 2014-11-01: qty 1

## 2014-11-01 MED ORDER — SODIUM CHLORIDE 0.9 % IJ SOLN
3.0000 mL | Freq: Two times a day (BID) | INTRAMUSCULAR | Status: DC
Start: 2014-11-02 — End: 2014-11-12
  Administered 2014-11-02 – 2014-11-11 (×7): 3 mL via INTRAVENOUS

## 2014-11-01 MED ORDER — ROCURONIUM BROMIDE 50 MG/5ML IV SOLN
INTRAVENOUS | Status: AC
  Filled 2014-11-01: qty 2

## 2014-11-01 MED ORDER — SODIUM CHLORIDE 0.9 % IV SOLN
INTRAVENOUS | Status: DC
Start: 2014-11-01 — End: 2014-11-02
  Administered 2014-11-01: 18:00:00 via INTRAVENOUS

## 2014-11-01 MED ORDER — FENTANYL CITRATE (PF) 250 MCG/5ML IJ SOLN
INTRAMUSCULAR | Status: AC
  Filled 2014-11-01: qty 5

## 2014-11-01 MED ORDER — PANTOPRAZOLE SODIUM 40 MG PO TBEC: 40.0000 mg | DELAYED_RELEASE_TABLET | Freq: Every day | ORAL | Status: DC

## 2014-11-01 MED ORDER — SODIUM CHLORIDE 0.9 % IV SOLN
INTRAVENOUS | Status: DC
  Administered 2014-11-01: 0.8 [IU]/h via INTRAVENOUS
  Administered 2014-11-01: 0.3 [IU]/h via INTRAVENOUS
  Filled 2014-11-01 (×2): qty 2.5

## 2014-11-01 MED ORDER — EPHEDRINE SULFATE 50 MG/ML IJ SOLN
INTRAMUSCULAR | Status: AC
Start: 2014-11-01 — End: 2014-11-01
  Filled 2014-11-01: qty 1

## 2014-11-01 MED ORDER — PROPOFOL 10 MG/ML IV BOLUS
INTRAVENOUS | Status: AC
  Filled 2014-11-01: qty 20

## 2014-11-01 MED ORDER — ACETAMINOPHEN 500 MG PO TABS
1000.0000 mg | ORAL_TABLET | Freq: Four times a day (QID) | ORAL | Status: AC
  Administered 2014-11-02 – 2014-11-06 (×15): 1000 mg via ORAL
  Filled 2014-11-01 (×21): qty 2

## 2014-11-01 MED ORDER — PROTAMINE SULFATE 10 MG/ML IV SOLN
INTRAVENOUS | Status: DC | PRN
  Administered 2014-11-01: 300 mg via INTRAVENOUS

## 2014-11-01 MED ORDER — VANCOMYCIN HCL IN DEXTROSE 1-5 GM/200ML-% IV SOLN
1000.0000 mg | Freq: Once | INTRAVENOUS | Status: AC
  Administered 2014-11-02: 1000 mg via INTRAVENOUS
  Filled 2014-11-01: qty 200

## 2014-11-01 MED ORDER — NITROGLYCERIN 0.2 MG/ML ON CALL CATH LAB
INTRAVENOUS | Status: DC | PRN
  Administered 2014-11-01: 40 ug via INTRAVENOUS

## 2014-11-01 MED ORDER — POLYMYXIN B SULFATE 500000 UNITS IJ SOLR
INTRAMUSCULAR | Status: DC | PRN
  Administered 2014-11-01: 500 mL

## 2014-11-01 MED ORDER — SODIUM CHLORIDE 0.9 % IV SOLN
Freq: Once | INTRAVENOUS | Status: AC
  Administered 2014-11-01: 18:00:00 via INTRAVENOUS

## 2014-11-01 MED ORDER — LACTATED RINGERS IV SOLN
INTRAVENOUS | Status: DC | PRN
  Administered 2014-11-01 (×2): via INTRAVENOUS

## 2014-11-01 MED ORDER — DEXMEDETOMIDINE HCL IN NACL 200 MCG/50ML IV SOLN
0.1000 ug/kg/h | INTRAVENOUS | Status: DC
  Filled 2014-11-01: qty 50

## 2014-11-01 MED ORDER — TRAMADOL HCL 50 MG PO TABS
50.0000 mg | ORAL_TABLET | ORAL | Status: DC | PRN
Start: 2014-11-01 — End: 2014-11-12
  Administered 2014-11-02: 50 mg via ORAL
  Administered 2014-11-04 (×2): 100 mg via ORAL
  Administered 2014-11-06 – 2014-11-07 (×2): 50 mg via ORAL
  Administered 2014-11-10: 100 mg via ORAL
  Filled 2014-11-01 (×4): qty 1
  Filled 2014-11-01 (×3): qty 2

## 2014-11-01 MED ORDER — ACETAMINOPHEN 160 MG/5ML PO SOLN
1000.0000 mg | Freq: Four times a day (QID) | ORAL | Status: DC
  Administered 2014-11-01: 1000 mg
  Filled 2014-11-01: qty 40.6

## 2014-11-01 MED ORDER — ROCURONIUM BROMIDE 50 MG/5ML IV SOLN
INTRAVENOUS | Status: AC
  Filled 2014-11-01: qty 1

## 2014-11-01 MED ORDER — PROPOFOL 10 MG/ML IV BOLUS
INTRAVENOUS | Status: DC | PRN
  Administered 2014-11-01: 20 mg via INTRAVENOUS
  Administered 2014-11-01: 30 mg via INTRAVENOUS

## 2014-11-01 MED ORDER — MIDAZOLAM HCL 2 MG/2ML IJ SOLN
2.0000 mg | INTRAMUSCULAR | Status: DC | PRN
Start: 2014-11-01 — End: 2014-11-02

## 2014-11-01 MED ORDER — METOPROLOL TARTRATE 12.5 MG HALF TABLET
12.5000 mg | ORAL_TABLET | Freq: Two times a day (BID) | ORAL | Status: DC
  Administered 2014-11-02 – 2014-11-04 (×5): 12.5 mg via ORAL
  Filled 2014-11-01 (×9): qty 1

## 2014-11-01 MED ORDER — OXYCODONE HCL 5 MG PO TABS
5.0000 mg | ORAL_TABLET | ORAL | Status: DC | PRN
  Administered 2014-11-02 (×2): 10 mg via ORAL
  Administered 2014-11-03 – 2014-11-04 (×2): 5 mg via ORAL
  Administered 2014-11-05 – 2014-11-06 (×5): 10 mg via ORAL
  Filled 2014-11-01 (×2): qty 2
  Filled 2014-11-01: qty 1
  Filled 2014-11-01 (×3): qty 2
  Filled 2014-11-01: qty 1
  Filled 2014-11-01 (×2): qty 2

## 2014-11-01 MED ORDER — ALBUMIN HUMAN 5 % IV SOLN
INTRAVENOUS | Status: DC | PRN
  Administered 2014-11-01: 16:00:00 via INTRAVENOUS

## 2014-11-01 MED ORDER — DEXTROSE 5 % IV SOLN
0.0000 ug/min | INTRAVENOUS | Status: DC
  Administered 2014-11-01: 10 ug/min via INTRAVENOUS
  Administered 2014-11-01: 5 ug/min via INTRAVENOUS
  Filled 2014-11-01 (×3): qty 2

## 2014-11-01 MED ORDER — CALCIUM CHLORIDE 10 % IV SOLN
1.0000 g | Freq: Once | INTRAVENOUS | Status: AC
  Administered 2014-11-01: 1 g via INTRAVENOUS

## 2014-11-01 MED ORDER — ALBUMIN HUMAN 5 % IV SOLN
250.0000 mL | INTRAVENOUS | Status: DC | PRN
  Administered 2014-11-01 (×3): 250 mL via INTRAVENOUS
  Filled 2014-11-01 (×2): qty 250

## 2014-11-01 MED ORDER — ROCURONIUM BROMIDE 100 MG/10ML IV SOLN
INTRAVENOUS | Status: DC | PRN
Start: 2014-11-01 — End: 2014-11-01
  Administered 2014-11-01: 70 mg via INTRAVENOUS
  Administered 2014-11-01: 50 mg via INTRAVENOUS
  Administered 2014-11-01: 30 mg via INTRAVENOUS
  Administered 2014-11-01 (×2): 50 mg via INTRAVENOUS

## 2014-11-01 MED ORDER — EPHEDRINE SULFATE 50 MG/ML IJ SOLN
INTRAMUSCULAR | Status: DC | PRN
  Administered 2014-11-01: 5 mg via INTRAVENOUS
  Administered 2014-11-01: 10 mg via INTRAVENOUS

## 2014-11-01 MED ORDER — SODIUM CHLORIDE 0.9 % IV SOLN
250.0000 mL | INTRAVENOUS | Status: DC
  Administered 2014-11-07: 250 mL via INTRAVENOUS

## 2014-11-01 MED ORDER — GLUTARALDEHYDE 0.625% SOAKING SOLUTION
TOPICAL | Status: DC
  Filled 2014-11-01 (×2): qty 50

## 2014-11-01 MED ORDER — DEXTROSE 5 % IV SOLN
1.5000 g | Freq: Two times a day (BID) | INTRAVENOUS | Status: DC
  Administered 2014-11-01: 1.5 g via INTRAVENOUS
  Filled 2014-11-01 (×3): qty 1.5

## 2014-11-01 MED ORDER — SODIUM CHLORIDE 0.9 % IV SOLN
INTRAVENOUS | Status: DC
  Administered 2014-11-01 – 2014-11-02 (×2): via INTRAVENOUS

## 2014-11-01 MED ORDER — SODIUM CHLORIDE 0.9 % IV SOLN
Freq: Once | INTRAVENOUS | Status: AC
  Administered 2014-11-01: 23:00:00 via INTRAVENOUS

## 2014-11-01 MED ORDER — HEPARIN SODIUM (PORCINE) 1000 UNIT/ML IJ SOLN
INTRAMUSCULAR | Status: AC
  Filled 2014-11-01: qty 1

## 2014-11-01 MED ORDER — MIDAZOLAM HCL 10 MG/2ML IJ SOLN
INTRAMUSCULAR | Status: AC
  Filled 2014-11-01: qty 4

## 2014-11-01 MED ORDER — SUCCINYLCHOLINE CHLORIDE 20 MG/ML IJ SOLN
INTRAMUSCULAR | Status: AC
  Filled 2014-11-01: qty 1

## 2014-11-01 MED ORDER — LACTATED RINGERS IV SOLN
INTRAVENOUS | Status: DC | PRN
  Administered 2014-11-01: 08:00:00 via INTRAVENOUS

## 2014-11-01 MED ORDER — MAGNESIUM SULFATE 4 GM/100ML IV SOLN
INTRAVENOUS | Status: AC
  Filled 2014-11-01: qty 100

## 2014-11-01 MED ORDER — PHENYLEPHRINE HCL 10 MG/ML IJ SOLN
INTRAMUSCULAR | Status: DC | PRN
  Administered 2014-11-01: 80 ug via INTRAVENOUS
  Administered 2014-11-01: 40 ug via INTRAVENOUS

## 2014-11-01 MED ORDER — CALCIUM CHLORIDE 10 % IV SOLN
INTRAVENOUS | Status: DC | PRN
  Administered 2014-11-01: 300 mg via INTRAVENOUS

## 2014-11-01 MED ORDER — METOPROLOL TARTRATE 1 MG/ML IV SOLN: 2.5000 mg | INTRAVENOUS | Status: DC | PRN

## 2014-11-01 MED ORDER — ACETAMINOPHEN 650 MG RE SUPP
650.0000 mg | Freq: Once | RECTAL | Status: AC
  Administered 2014-11-01: 650 mg via RECTAL

## 2014-11-01 MED ORDER — ACETAMINOPHEN 160 MG/5ML PO SOLN: 650.0000 mg | Freq: Once | ORAL | Status: AC

## 2014-11-01 MED ORDER — FENTANYL CITRATE (PF) 100 MCG/2ML IJ SOLN
INTRAMUSCULAR | Status: DC | PRN
  Administered 2014-11-01: 50 ug via INTRAVENOUS
  Administered 2014-11-01: 250 ug via INTRAVENOUS
  Administered 2014-11-01: 50 ug via INTRAVENOUS
  Administered 2014-11-01: 200 ug via INTRAVENOUS
  Administered 2014-11-01 (×3): 250 ug via INTRAVENOUS
  Administered 2014-11-01: 50 ug via INTRAVENOUS
  Administered 2014-11-01: 200 ug via INTRAVENOUS

## 2014-11-01 MED ORDER — SODIUM CHLORIDE 0.9 % IJ SOLN
3.0000 mL | INTRAMUSCULAR | Status: DC | PRN
  Administered 2014-11-02: 3 mL via INTRAVENOUS
  Filled 2014-11-01: qty 3

## 2014-11-01 MED ORDER — DOCUSATE SODIUM 100 MG PO CAPS
200.0000 mg | ORAL_CAPSULE | Freq: Every day | ORAL | Status: DC
  Administered 2014-11-02 – 2014-11-08 (×3): 200 mg via ORAL
  Filled 2014-11-01 (×10): qty 2

## 2014-11-01 MED ORDER — NITROGLYCERIN IN D5W 200-5 MCG/ML-% IV SOLN: 0.0000 ug/min | INTRAVENOUS | Status: DC

## 2014-11-01 MED ORDER — ALBUMIN HUMAN 5 % IV SOLN
250.0000 mL | INTRAVENOUS | Status: AC | PRN
  Administered 2014-11-01 (×3): 250 mL via INTRAVENOUS
  Filled 2014-11-01 (×2): qty 250

## 2014-11-01 MED ORDER — HEPARIN SODIUM (PORCINE) 1000 UNIT/ML IJ SOLN
INTRAMUSCULAR | Status: AC
  Filled 2014-11-01: qty 2

## 2014-11-01 MED ORDER — INSULIN REGULAR BOLUS VIA INFUSION
0.0000 [IU] | Freq: Three times a day (TID) | INTRAVENOUS | Status: DC
  Filled 2014-11-01: qty 10

## 2014-11-01 MED ORDER — BISACODYL 10 MG RE SUPP
10.0000 mg | Freq: Every day | RECTAL | Status: DC
  Filled 2014-11-01: qty 1

## 2014-11-01 MED ORDER — LIDOCAINE HCL (CARDIAC) 20 MG/ML IV SOLN
INTRAVENOUS | Status: AC
  Filled 2014-11-01: qty 5

## 2014-11-01 MED ORDER — SODIUM CHLORIDE 0.9 % IJ SOLN
INTRAMUSCULAR | Status: AC
  Filled 2014-11-01: qty 10

## 2014-11-01 MED ORDER — MORPHINE SULFATE (PF) 2 MG/ML IV SOLN
1.0000 mg | INTRAVENOUS | Status: DC | PRN
Start: 2014-11-01 — End: 2014-11-02
  Filled 2014-11-01: qty 1

## 2014-11-01 MED ORDER — HEPARIN SODIUM (PORCINE) 1000 UNIT/ML IJ SOLN
INTRAMUSCULAR | Status: DC | PRN
  Administered 2014-11-01: 35000 [IU] via INTRAVENOUS
  Administered 2014-11-01: 3000 [IU] via INTRAVENOUS

## 2014-11-01 MED ORDER — SODIUM CHLORIDE 0.45 % IV SOLN: INTRAVENOUS | Status: DC | PRN

## 2014-11-01 MED ORDER — MAGNESIUM SULFATE 4 GM/100ML IV SOLN
4.0000 g | Freq: Once | INTRAVENOUS | Status: AC
  Administered 2014-11-01: 4 g via INTRAVENOUS
  Filled 2014-11-01: qty 100

## 2014-11-01 MED ORDER — BISACODYL 5 MG PO TBEC
10.0000 mg | DELAYED_RELEASE_TABLET | Freq: Every day | ORAL | Status: DC
  Administered 2014-11-02: 10 mg via ORAL
  Filled 2014-11-01 (×4): qty 2

## 2014-11-01 MED ORDER — ASPIRIN EC 325 MG PO TBEC
325.0000 mg | DELAYED_RELEASE_TABLET | Freq: Every day | ORAL | Status: DC
  Administered 2014-11-02 – 2014-11-05 (×4): 325 mg via ORAL
  Filled 2014-11-01 (×5): qty 1

## 2014-11-01 MED ORDER — CALCIUM CHLORIDE 10 % IV SOLN
INTRAVENOUS | Status: AC
  Filled 2014-11-01: qty 10

## 2014-11-01 MED ORDER — METOPROLOL TARTRATE 25 MG/10 ML ORAL SUSPENSION
12.5000 mg | Freq: Two times a day (BID) | ORAL | Status: DC
  Filled 2014-11-01 (×3): qty 5

## 2014-11-01 MED ORDER — PHENYLEPHRINE 40 MCG/ML (10ML) SYRINGE FOR IV PUSH (FOR BLOOD PRESSURE SUPPORT)
PREFILLED_SYRINGE | INTRAVENOUS | Status: AC
  Filled 2014-11-01: qty 10

## 2014-11-01 MED ORDER — LACTATED RINGERS IV SOLN: 500.0000 mL | Freq: Once | INTRAVENOUS | Status: DC | PRN

## 2014-11-01 MED ORDER — POTASSIUM CHLORIDE 10 MEQ/50ML IV SOLN: 10.0000 meq | INTRAVENOUS | Status: AC

## 2014-11-01 MED ORDER — DEXMEDETOMIDINE HCL IN NACL 200 MCG/50ML IV SOLN
0.0000 ug/kg/h | INTRAVENOUS | Status: DC
  Administered 2014-11-01: 0.7 ug/kg/h via INTRAVENOUS
  Filled 2014-11-01: qty 50

## 2014-11-01 MED ORDER — INSULIN ASPART 100 UNIT/ML ~~LOC~~ SOLN
0.0000 [IU] | SUBCUTANEOUS | Status: DC
  Administered 2014-11-01: 2 [IU] via SUBCUTANEOUS
  Administered 2014-11-02: 4 [IU] via SUBCUTANEOUS
  Administered 2014-11-02: 2 [IU] via SUBCUTANEOUS
  Administered 2014-11-02: 4 [IU] via SUBCUTANEOUS

## 2014-11-01 MED ORDER — HEMOSTATIC AGENTS (NO CHARGE) OPTIME
TOPICAL | Status: DC | PRN
  Administered 2014-11-01 (×4): 1 via TOPICAL

## 2014-11-01 MED ORDER — ONDANSETRON HCL 4 MG/2ML IJ SOLN
4.0000 mg | Freq: Four times a day (QID) | INTRAMUSCULAR | Status: DC | PRN
  Administered 2014-11-07: 4 mg via INTRAVENOUS
  Filled 2014-11-01: qty 2

## 2014-11-01 MED ORDER — FAMOTIDINE IN NACL 20-0.9 MG/50ML-% IV SOLN
20.0000 mg | Freq: Two times a day (BID) | INTRAVENOUS | Status: AC
  Administered 2014-11-01 (×2): 20 mg via INTRAVENOUS
  Filled 2014-11-01: qty 50

## 2014-11-01 MED ORDER — DOPAMINE-DEXTROSE 3.2-5 MG/ML-% IV SOLN
3.0000 ug/kg/min | INTRAVENOUS | Status: DC
  Administered 2014-11-01: 3 ug/kg/min via INTRAVENOUS

## 2014-11-01 SURGICAL SUPPLY — 156 items
ADAPTER CARDIO PERF ANTE/RETRO (ADAPTER) ×3 IMPLANT
ADPR PRFSN 84XANTGRD RTRGD (ADAPTER) ×2
APPLICATOR COTTON TIP 6IN STRL (MISCELLANEOUS) IMPLANT
BAG DECANTER FOR FLEXI CONT (MISCELLANEOUS) ×9 IMPLANT
BANDAGE ELASTIC 4 VELCRO ST LF (GAUZE/BANDAGES/DRESSINGS) ×3 IMPLANT
BANDAGE ELASTIC 6 VELCRO ST LF (GAUZE/BANDAGES/DRESSINGS) ×3 IMPLANT
BASKET HEART (ORDER IN 25'S) (MISCELLANEOUS) ×1
BASKET HEART (ORDER IN 25S) (MISCELLANEOUS) ×2 IMPLANT
BLADE STERNUM SYSTEM 6 (BLADE) ×6 IMPLANT
BLADE SURG 11 STRL SS (BLADE) ×4 IMPLANT
BLADE SURG ROTATE 9660 (MISCELLANEOUS) IMPLANT
BNDG GAUZE ELAST 4 BULKY (GAUZE/BANDAGES/DRESSINGS) ×3 IMPLANT
CANISTER SUCTION 2500CC (MISCELLANEOUS) ×6 IMPLANT
CANN PRFSN 3/8X14X24FR PCFC (MISCELLANEOUS)
CANN PRFSN 3/8XCNCT ST RT ANG (MISCELLANEOUS)
CANNULA EZ GLIDE AORTIC 21FR (CANNULA) ×9 IMPLANT
CANNULA FEM VENOUS REMOTE 22FR (CANNULA) ×1 IMPLANT
CANNULA GUNDRY RCSP 15FR (MISCELLANEOUS) IMPLANT
CANNULA PRFSN 3/8X14X24FR PCFC (MISCELLANEOUS) IMPLANT
CANNULA PRFSN 3/8XCNCT RT ANG (MISCELLANEOUS) IMPLANT
CANNULA VEN MTL TIP RT (MISCELLANEOUS)
CANNULA VENNOUS METAL TIP 20FR (CANNULA) ×1 IMPLANT
CATH CPB KIT OWEN (MISCELLANEOUS) ×3 IMPLANT
CATH FOLEY 2WAY SLVR  5CC 14FR (CATHETERS)
CATH FOLEY 2WAY SLVR 5CC 14FR (CATHETERS) IMPLANT
CATH THORACIC 28FR RT ANG (CATHETERS) IMPLANT
CATH THORACIC 36FR (CATHETERS) ×3 IMPLANT
CLIP FOGARTY SPRING 6M (CLIP) IMPLANT
CLIP TI MEDIUM 24 (CLIP) ×2 IMPLANT
CLIP TI WIDE RED SMALL 24 (CLIP) ×3 IMPLANT
CONN 1/2X1/2X1/2  BEN (MISCELLANEOUS) ×2
CONN 1/2X1/2X1/2 BEN (MISCELLANEOUS) ×2 IMPLANT
CONN 3/8X1/2 ST GISH (MISCELLANEOUS) ×6 IMPLANT
CONN ST 1/4X3/8  BEN (MISCELLANEOUS) ×2
CONN ST 1/4X3/8 BEN (MISCELLANEOUS) IMPLANT
COVER SURGICAL LIGHT HANDLE (MISCELLANEOUS) ×6 IMPLANT
CRADLE DONUT ADULT HEAD (MISCELLANEOUS) ×6 IMPLANT
DEVICE SUT CK QUICK LOAD INDV (Prosthesis & Implant Heart) ×1 IMPLANT
DEVICE SUT CK QUICK LOAD MINI (Prosthesis & Implant Heart) ×1 IMPLANT
DRAIN CHANNEL 32F RND 10.7 FF (WOUND CARE) ×6 IMPLANT
DRAPE BILATERAL SPLIT (DRAPES) IMPLANT
DRAPE CARDIOVASCULAR INCISE (DRAPES) ×3
DRAPE CV SPLIT W-CLR ANES SCRN (DRAPES) IMPLANT
DRAPE INCISE IOBAN 66X45 STRL (DRAPES) ×6 IMPLANT
DRAPE SLUSH/WARMER DISC (DRAPES) ×3 IMPLANT
DRAPE SRG 135X102X78XABS (DRAPES) ×2 IMPLANT
DRSG AQUACEL AG ADV 3.5X14 (GAUZE/BANDAGES/DRESSINGS) ×1 IMPLANT
DRSG COVADERM 4X14 (GAUZE/BANDAGES/DRESSINGS) ×5 IMPLANT
ELECT BLADE 4.0 EZ CLEAN MEGAD (MISCELLANEOUS) ×3
ELECT REM PT RETURN 9FT ADLT (ELECTROSURGICAL) ×12
ELECTRODE BLDE 4.0 EZ CLN MEGD (MISCELLANEOUS) IMPLANT
ELECTRODE REM PT RTRN 9FT ADLT (ELECTROSURGICAL) ×8 IMPLANT
FEMORAL VENOUS CANN RAP (CANNULA) ×1 IMPLANT
GAUZE SPONGE 4X4 12PLY STRL (GAUZE/BANDAGES/DRESSINGS) ×9 IMPLANT
GLOVE BIO SURGEON STRL SZ 6 (GLOVE) ×4 IMPLANT
GLOVE BIO SURGEON STRL SZ 6.5 (GLOVE) ×6 IMPLANT
GLOVE BIO SURGEON STRL SZ7 (GLOVE) ×2 IMPLANT
GLOVE BIO SURGEON STRL SZ7.5 (GLOVE) ×2 IMPLANT
GLOVE ORTHO TXT STRL SZ7.5 (GLOVE) ×6 IMPLANT
GOWN STRL REUS W/ TWL LRG LVL3 (GOWN DISPOSABLE) ×16 IMPLANT
GOWN STRL REUS W/TWL LRG LVL3 (GOWN DISPOSABLE) ×24
HEMOSTAT POWDER SURGIFOAM 1G (HEMOSTASIS) ×13 IMPLANT
HEMOSTAT SURGICEL 2X4 FIBR (HEMOSTASIS) ×1 IMPLANT
INSERT FOGARTY XLG (MISCELLANEOUS) ×6 IMPLANT
KIT BASIN OR (CUSTOM PROCEDURE TRAY) ×6 IMPLANT
KIT DEVICE SUT COR-KNOT MIS 5 (INSTRUMENTS) ×1 IMPLANT
KIT DILATOR VASC 18G NDL (KITS) ×1 IMPLANT
KIT DRAINAGE VACCUM ASSIST (KITS) ×1 IMPLANT
KIT ROOM TURNOVER OR (KITS) ×6 IMPLANT
KIT SUCTION CATH 14FR (SUCTIONS) ×20 IMPLANT
KIT SUT CK MINI COMBO 4X17 (Prosthesis & Implant Heart) ×1 IMPLANT
KIT VASOVIEW W/TROCAR VH 2000 (KITS) ×3 IMPLANT
LEAD PACING MYOCARDI (MISCELLANEOUS) ×3 IMPLANT
LINE VENT (MISCELLANEOUS) ×1 IMPLANT
MARKER GRAFT CORONARY BYPASS (MISCELLANEOUS) ×9 IMPLANT
NS IRRIG 1000ML POUR BTL (IV SOLUTION) ×27 IMPLANT
PACK OPEN HEART (CUSTOM PROCEDURE TRAY) ×6 IMPLANT
PAD ARMBOARD 7.5X6 YLW CONV (MISCELLANEOUS) ×12 IMPLANT
PAD ELECT DEFIB RADIOL ZOLL (MISCELLANEOUS) ×3 IMPLANT
PENCIL BUTTON HOLSTER BLD 10FT (ELECTRODE) ×3 IMPLANT
PUNCH AORTIC ROTATE 4.0MM (MISCELLANEOUS) IMPLANT
PUNCH AORTIC ROTATE 4.5MM 8IN (MISCELLANEOUS) IMPLANT
PUNCH AORTIC ROTATE 5MM 8IN (MISCELLANEOUS) IMPLANT
SEALANT SURG COSEAL 8ML (VASCULAR PRODUCTS) ×1 IMPLANT
SET CARDIOPLEGIA MPS 5001102 (MISCELLANEOUS) ×1 IMPLANT
SET IRRIG TUBING LAPAROSCOPIC (IRRIGATION / IRRIGATOR) ×3 IMPLANT
SOLUTION ANTI FOG 6CC (MISCELLANEOUS) ×1 IMPLANT
SPONGE GAUZE 4X4 12PLY STER LF (GAUZE/BANDAGES/DRESSINGS) ×1 IMPLANT
SPONGE LAP 18X18 X RAY DECT (DISPOSABLE) ×2 IMPLANT
SPONGE LAP 4X18 X RAY DECT (DISPOSABLE) ×1 IMPLANT
STRIP PERIGUARD 6X8 (Vascular Products) ×1 IMPLANT
SUCKER INTRACARDIAC WEIGHTED (SUCKER) ×3 IMPLANT
SURGIFLO W/THROMBIN 8M KIT (HEMOSTASIS) ×1 IMPLANT
SUT BONE WAX W31G (SUTURE) ×3 IMPLANT
SUT ETHIBON 2 0 V 52N 30 (SUTURE) ×2 IMPLANT
SUT ETHIBOND 2 0 SH (SUTURE) ×14 IMPLANT
SUT ETHIBOND 2 0 SH 36X2 (SUTURE) ×4 IMPLANT
SUT ETHIBOND 2 0 V4 (SUTURE) ×1 IMPLANT
SUT ETHIBOND 2 0V4 GREEN (SUTURE) IMPLANT
SUT ETHIBOND 4 0 TF (SUTURE) IMPLANT
SUT ETHIBOND 5 0 C 1 30 (SUTURE) ×3 IMPLANT
SUT ETHIBOND X763 2 0 SH 1 (SUTURE) ×7 IMPLANT
SUT MNCRL AB 3-0 PS2 18 (SUTURE) ×8 IMPLANT
SUT MNCRL AB 4-0 PS2 18 (SUTURE) IMPLANT
SUT PDS AB 1 CTX 36 (SUTURE) ×6 IMPLANT
SUT PROLENE 2 0 SH DA (SUTURE) IMPLANT
SUT PROLENE 3 0 SH 1 (SUTURE) ×3 IMPLANT
SUT PROLENE 3 0 SH DA (SUTURE) ×4 IMPLANT
SUT PROLENE 3 0 SH1 36 (SUTURE) ×8 IMPLANT
SUT PROLENE 4 0 RB 1 (SUTURE) ×78
SUT PROLENE 4 0 SH DA (SUTURE) ×6 IMPLANT
SUT PROLENE 4-0 RB1 .5 CRCL 36 (SUTURE) ×4 IMPLANT
SUT PROLENE 5 0 C 1 36 (SUTURE) ×2 IMPLANT
SUT PROLENE 6 0 C 1 30 (SUTURE) ×2 IMPLANT
SUT PROLENE 7.0 RB 3 (SUTURE) ×9 IMPLANT
SUT PROLENE 8 0 BV175 6 (SUTURE) ×2 IMPLANT
SUT PROLENE BLUE 7 0 (SUTURE) ×3 IMPLANT
SUT PROLENE POLY MONO (SUTURE) IMPLANT
SUT SILK  1 MH (SUTURE) ×5
SUT SILK 1 MH (SUTURE) ×2 IMPLANT
SUT SILK 1 TIES 10X30 (SUTURE) ×1 IMPLANT
SUT SILK 2 0 SH CR/8 (SUTURE) ×2 IMPLANT
SUT SILK 2 0 TIES 10X30 (SUTURE) ×1 IMPLANT
SUT SILK 2 0 TIES 17X18 (SUTURE) ×3
SUT SILK 2-0 18XBRD TIE BLK (SUTURE) IMPLANT
SUT SILK 3 0 SH CR/8 (SUTURE) ×1 IMPLANT
SUT SILK 4 0 TIE 10X30 (SUTURE) ×2 IMPLANT
SUT STEEL 6MS V (SUTURE) IMPLANT
SUT STEEL STERNAL CCS#1 18IN (SUTURE) IMPLANT
SUT STEEL SZ 6 DBL 3X14 BALL (SUTURE) IMPLANT
SUT TEM PAC WIRE 2 0 SH (SUTURE) ×5 IMPLANT
SUT VIC AB 1 CTX 36 (SUTURE)
SUT VIC AB 1 CTX36XBRD ANBCTR (SUTURE) IMPLANT
SUT VIC AB 2-0 CT1 27 (SUTURE)
SUT VIC AB 2-0 CT1 TAPERPNT 27 (SUTURE) IMPLANT
SUT VIC AB 2-0 CTX 27 (SUTURE) IMPLANT
SUT VIC AB 3-0 SH 27 (SUTURE)
SUT VIC AB 3-0 SH 27X BRD (SUTURE) IMPLANT
SUT VIC AB 3-0 X1 27 (SUTURE) IMPLANT
SUT VICRYL 4-0 PS2 18IN ABS (SUTURE) IMPLANT
SUTURE E-PAK OPEN HEART (SUTURE) ×3 IMPLANT
SYS ARTICLIP LAA EXCLUSION 140 (Clip) ×1 IMPLANT
SYSTEM SAHARA CHEST DRAIN ATS (WOUND CARE) ×6 IMPLANT
TAPE CLOTH SURG 6X10 WHT LF (GAUZE/BANDAGES/DRESSINGS) ×1 IMPLANT
TOWEL OR 17X24 6PK STRL BLUE (TOWEL DISPOSABLE) ×9 IMPLANT
TOWEL OR 17X26 10 PK STRL BLUE (TOWEL DISPOSABLE) ×9 IMPLANT
TRAY FOLEY IC TEMP SENS 16FR (CATHETERS) ×6 IMPLANT
TUBE ANAEROBIC SPECIMEN COL (MISCELLANEOUS) ×1 IMPLANT
TUBE CONNECTING 12X1/4 (SUCTIONS) ×2 IMPLANT
TUBING ART PRESS 48 MALE/FEM (TUBING) ×2 IMPLANT
TUBING INSUFFLATION (TUBING) ×3 IMPLANT
TUBING INSUFFLATION 10FT LAP (TUBING) ×3 IMPLANT
UNDERPAD 30X30 INCONTINENT (UNDERPADS AND DIAPERS) ×6 IMPLANT
VALVE MAGNA EASE AORTIC 23MM (Prosthesis & Implant Heart) ×1 IMPLANT
WATER STERILE IRR 1000ML POUR (IV SOLUTION) ×12 IMPLANT
YANKAUER SUCT BULB TIP NO VENT (SUCTIONS) ×1 IMPLANT

## 2014-11-01 NOTE — Anesthesia Preprocedure Evaluation (Signed)
Anesthesia Evaluation  Patient identified by MRN, date of birth, ID band Patient awake    Reviewed: Allergy & Precautions, NPO status , Patient's Chart, lab work & pertinent test results  History of Anesthesia Complications Negative for: history of anesthetic complications  Airway Mallampati: II  TM Distance: >3 FB Neck ROM: Full    Dental  (+) Teeth Intact   Pulmonary neg shortness of breath, neg sleep apnea, neg COPD, neg recent URI, former smoker, neg PE   breath sounds clear to auscultation       Cardiovascular + CAD and + Peripheral Vascular Disease  + Valvular Problems/Murmurs  Rhythm:Regular     Neuro/Psych PSYCHIATRIC DISORDERS CVA    GI/Hepatic (+) Hepatitis -  Endo/Other  negative endocrine ROS  Renal/GU Renal InsufficiencyRenal disease     Musculoskeletal   Abdominal   Peds  Hematology  (+) Blood dyscrasia, anemia ,   Anesthesia Other Findings   Reproductive/Obstetrics                             Anesthesia Physical Anesthesia Plan  ASA: IV  Anesthesia Plan: General   Post-op Pain Management:    Induction: Intravenous  Airway Management Planned: Oral ETT  Additional Equipment: Arterial line, TEE, CVP, PA Cath, 3D TEE and Ultrasound Guidance Line Placement  Intra-op Plan:   Post-operative Plan: Post-operative intubation/ventilation  Informed Consent: I have reviewed the patients History and Physical, chart, labs and discussed the procedure including the risks, benefits and alternatives for the proposed anesthesia with the patient or authorized representative who has indicated his/her understanding and acceptance.   Dental advisory given  Plan Discussed with: CRNA and Surgeon  Anesthesia Plan Comments:         Anesthesia Quick Evaluation

## 2014-11-01 NOTE — Progress Notes (Signed)
CT surgery p.m. Rounds  Status post aVR, mitral repair and CABG 1 Patient starting to wake up on ventilator Filling pressures have been low, receiving supplemental 5% albumen. 6 hour hemoglobin 7.7, one unit packed cells ordered. Urine output 40-50 cc/h with cardiac index 1.8-start renal dose dopamine Chest tube drainage minimal

## 2014-11-01 NOTE — Anesthesia Procedure Notes (Signed)
Procedure Name: Intubation Date/Time: 11/01/2014 9:17 AM Performed by: Izora Gala Pre-anesthesia Checklist: Patient identified, Emergency Drugs available, Suction available and Patient being monitored Patient Re-evaluated:Patient Re-evaluated prior to inductionOxygen Delivery Method: Circle system utilized Preoxygenation: Pre-oxygenation with 100% oxygen Intubation Type: IV induction Ventilation: Mask ventilation without difficulty Laryngoscope Size: Miller and 3 Tube type: Oral Tube size: 8.0 mm Number of attempts: 1 Airway Equipment and Method: Stylet Placement Confirmation: ETT inserted through vocal cords under direct vision,  positive ETCO2 and breath sounds checked- equal and bilateral Secured at: 22 cm Tube secured with: Tape Dental Injury: Teeth and Oropharynx as per pre-operative assessment

## 2014-11-01 NOTE — Progress Notes (Signed)
Nutrition Follow-up  DOCUMENTATION CODES:   Severe malnutrition in context of acute illness/injury  INTERVENTION:   Advance diet as medically appropriate, re-order supplements once able  NUTRITION DIAGNOSIS:   Malnutrition related to acute illness as evidenced by severe depletion of body fat, severe depletion of muscle mass, 13 percent weight loss in the last 6-7 weeks, energy intake < or equal to 50% for > or equal to 5 days, ongoing  GOAL:   Patient will meet greater than or equal to 90% of their needs, currently unmet  MONITOR:   Diet advancement, PO intake, Labs, Weight trends, I & O's  ASSESSMENT:   65 y.o. Male with PMH of prostate cancer, RLS, who presents result in mental status, dizziness and a rash.  Pt currently in OR for CABG.  Prior to NPO status, pt was on a Heart Healthy diet.  PO intake was mostly 75-100% per flowsheet records.  Malnutrition identified upon initial nutrition assessment -- ongoing.  Ensure Enlive supplement orders in place (currently on hold) -- was drinking prior to surgery.  Diet Order:  Diet NPO time specified  Skin:  Reviewed, no issues  Last BM:  9/6  Height:   Ht Readings from Last 1 Encounters:  11/01/14 6' (1.829 m)    Weight:   Wt Readings from Last 1 Encounters:  11/01/14 153 lb (69.4 kg)    Ideal Body Weight:  81 kg  BMI:  Body mass index is 20.75 kg/(m^2).  Estimated Nutritional Needs:   Kcal:  2100-2300  Protein:  110-125 grams  Fluid:  > 2.1 L/day  EDUCATION NEEDS:   No education needs identified at this time  Arthur Holms, RD, LDN Pager #: [phone] After-Hours Pager #: [phone]

## 2014-11-01 NOTE — Transfer of Care (Signed)
Immediate Anesthesia Transfer of Care Note  Patient: Alan [name]  Procedure(s) Performed: Procedure(s): MITRAL VALVE REPAIR with no Ring (N/A) CORONARY ARTERY BYPASS GRAFTING (CABG) x 1 using internal mammary artery (N/A) TRANSESOPHAGEAL ECHOCARDIOGRAM (TEE) (N/A) AORTIC VALVE REPLACEMENT (AVR) (N/A) AORTIC ROOT ENLARGEMENT (N/A)  Patient Location: PACU  Anesthesia Type:General  Level of Consciousness: Patient remains intubated per anesthesia plan  Airway & Oxygen Therapy: Patient remains intubated per anesthesia plan and Patient placed on Ventilator (see vital sign flow sheet for setting)  Post-op Assessment: Report given to RN  Post vital signs: Reviewed and stable  Last Vitals:  Filed Vitals:   11/01/14 0536  BP: 99/62  Pulse: 92  Temp: 37.4 C  Resp: 18    Complications: No apparent anesthesia complications

## 2014-11-01 NOTE — Progress Notes (Signed)
OT cancellation    11/01/14 0349  OT Visit Information  Last OT Received On 11/01/14  Reason Eval/Treat Not Completed Patient at procedure or test/ unavailable. Pt in OR.   Roseanne Reno, OTR/L [phone]

## 2014-11-01 NOTE — OR Nursing (Signed)
20 min call to SICU Charge RN at 1600

## 2014-11-01 NOTE — Progress Notes (Signed)
Patient ID: Alan [name], male   DOB: February 26, 1950, 64 y.o.   MRN: [id number]  [facility] PROGRESS NOTE  Alan [name] CVE:[id number] DOB: 02-06-1951 DOA: 10/17/2014 PCP: [name], MD  Subjective:   No changes clinically, creatinine and BUN improved after IVF's. No CP and no SOB. Patient will have CABG with aortic valve replacement later today.  Brief H&P and hospital course:    64 y.o. male with past medical history of prostate cancer, RLS who presented to [facility] with worsening symptoms of dizziness and gait instability. He had some mental status changes and was lethargic prior to admission for which reason his wife brought him in to ED. Pt has been working in a yard a lot and per wife she was concerned that he may have had a tick bit.  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 Patient has been seen by ID and cardiology in consultation. He is showing an improvement although slow on therapy with Omnipen and Rocephin.  Barrier to discharge: He will have aortic valve replacement and probable CABG later today 11/01/14.  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 Urine culture also grew enterococcus species. Repeat blood cultures on 10/20/2014 shows no growth to date. - MRI brain on 10/20/2014 showed scattered small acute to subacute and occasional early chronic, lacunar infarcts in both MCA territories and the left cerebellum. consider sequelae of endocarditis and septic emboli in this setting.  - Pt is status post TEE which demonstrated mild MR with vegetation on atrial surface of anterior mitral leaflet as well as severe AR with large mobile vegetation on the non coronary cusp. ID was consulted and pt initially on vanco, gentamycin but  currently on Omnipen and rocephin. So far tolerating this therapy well with no signs of adverse reaction.  -Plan is for valve replacement and CABG today by Dr. Roxy Manns -Of note, studies such as UDS, HIV Ab, RPR all normal. -Continue Rocephin and ampicillin  Paroxysmal atrial fibrillation /Atrial flutter - CHADS vasc score 5 - So far rate relatively controlled - Appreciate cardiology following and will follow rec's for anticoagulation  Troponin elevation -Likely demand ischemia from acute septic emboli and bacteremia -S/P cardiac cath and with findings of severe 3 vessels disease; plan is for CABG -Continue daily aspirin and metoprolol  -will follow cardiology and cardiothoracic rec's  History of  CVA  - As evidenced on MRI brain, pt has chronic anterior division left MCA infarct and a small number of chronic micro hemorrhages are seen in both hemispheres in the left cerebellum. Unchanged from past  - Continue daily aspirin - Appreciate physical therapy evaluation   Diarrhea - GI pathogen panel negative -most likely due to use of antibiotics -will recommend use of florastor   Restless leg syndrome - Continue Pramipexole  Normocytic anemia - Hemoglobin overall stable  -will follow closely and transfuse as needed (Hgb < 7.5) -no signs of bleeding appreciated with recent colonoscopy   Dizziness - Likely combination of previous stroke and septic emboli - Appreciate PT evaluation  -will assess capacity for ADL's after surgery to determine discharge needs and recovery pathway   Splenic lesion - Ultrasound showed a questionable lesion, MRI showed questionable infarct. -most likely septic emboli   DVT Prophylaxis  - SCD's bilaterally   Code Status: Full.  Family Communication:  plan of  care discussed with the patient; no family at bedside Disposition Plan: anticipated surgery later today   IV access:  Peripheral IV  Procedures and diagnostic studies:    Dg Orthopantogram  10/21/2014 1. No fracture or bone lesion. No evidence of a dental abscess or untreated carie.   Electronically Signed   By: Lajean Manes M.D.   On: 10/21/2014 10:09   Mra Head Wo Contrast 10/20/2014    1. Scattered small acute to subacute, and occasional early chronic, lacunar infarcts in both MCA territories and the left cerebellum. None of these appear hemorrhagic (cerebral septic emboli are prone to bleed), but consider sequelae of endocarditis and septic emboli in this setting. No significant edema or mass effect. 2. Chronic anterior division left MCA infarct. A small number of chronic micro hemorrhages are identified in both hemispheres an the left cerebellum. 3. Mild for age intracranial atherosclerosis with no stenosis or branch occlusion identified. Mild intracranial artery dolichoectasia.   Electronically Signed   By: Genevie Ann M.D.   On: 10/20/2014 09:49   Mri [name] Contrast 10/20/2014  Scattered small acute to subacute, and occasional early chronic, lacunar infarcts in both MCA territories and the left cerebellum. None of these appear hemorrhagic (cerebral septic emboli are prone to bleed), but consider sequelae of endocarditis and septic emboli in this setting. No significant edema or mass effect. 2. Chronic anterior division left MCA infarct. A small number of chronic micro hemorrhages are identified in both hemispheres an the left cerebellum. 3. Mild for age intracranial atherosclerosis with no stenosis or branch occlusion identified. Mild intracranial artery dolichoectasia.   Electronically Signed   By: Genevie Ann M.D.   On: 10/20/2014 09:49   US Abdomen Complete 10/23/2014   Large irregular hypoechoic lesion within the spleen which is incompletely characterized on ultrasound. While splenic infarct is not excluded, this is favored to represent a mass lesion. Given the size of the lesion and no priors for comparison, when patient clinically able, recommend evaluation with MRI.  Mild splenomegaly.   Increased renal cortical echogenicity as can be seen with chronic medical renal disease.  Bilateral pleural effusions.  These results will be called to the ordering clinician or representative by the Radiologist Assistant, and communication documented in the PACS or zVision Dashboard.   Electronically Signed   By: Lovey Newcomer M.D.   On: 10/23/2014 11:25   Dg Chest 2 View 10/18/2014 No active cardiopulmonary disease.   Electronically Signed   By: Lucienne Capers M.D.   On: 10/18/2014 00:49   Cardiac catheterization 10/23/2014   Medical Consultants:  Cardiology [facility] (Dr. Arta Silence) Infectious disease Cardiothoracic surgery (Dr. Roxy Manns)   Other Consultants:  Physical therapy  IAnti-Infectives:   Ampicillin Rocephin    Barton Dubois, MD  [facility] Pager [phone]  Time spent in minutes: 20 minutes  If 7PM-7AM, please contact night-coverage [email or web address] Password [email or web address] 11/01/2014, 3:50 PM   LOS: 14 days     Objective: Filed Vitals:   10/31/14 0420 10/31/14 1300 10/31/14 2001 11/01/14 0536  BP: 113/63 119/53 125/59 99/62  Pulse: 84 69 93 92  Temp: 98.3 F (36.8 C) 98.1 F (36.7 C) 98.4 F (36.9 C) 99.3 F (37.4 C)  TempSrc: Oral Oral Oral Oral  Resp: 16 18 18 18   Height:    6' (1.829 m)  Weight:    69.4 kg (153 lb)  SpO2: 96% 100% 99% 99%    Intake/Output Summary (Last 24 hours) at 11/01/14 1550 Last  data filed at 11/01/14 1549  Gross per 24 hour  Intake   5885 ml  Output   2200 ml  Net   3685 ml    Exam:   General:  Pt is alert, follows commands appropriately, not in acute distress. Slightly nervous about surgery. No CP and no SOB  Cardiovascular: regular rate, positive murmur, no rubs or gallops, S1/S2 appreciated   Respiratory: Clear to auscultation bilaterally, no wheezing, no crackles, no rhonchi  Abdomen: Soft, non tender, non distended, bowel sounds present  Extremities: No edema, pulses DP and PT palpable bilaterally  Neuro:  Grossly nonfocal  Data Reviewed: Basic Metabolic Panel:  Recent Labs Lab 10/31/14 0413 11/01/14 0334  11/01/14 1220 11/01/14 1321 11/01/14 1425 11/01/14 1512 11/01/14 1517  NA 135 132*  < > 133* 133* 134* 135 137  K 4.6 4.4  < > 4.4 4.4 4.9 3.9 3.7  CL 98* 97*  < > 99* 101 101 110 103  CO2 29 28  --   --   --   --   --   --   GLUCOSE 112* 109*  < > 141* 139* 138* 112* 110*  BUN 21* 17  < > 13 13 14 13 14   CREATININE 1.27* 1.29*  < > 0.80 0.80 0.80 0.80 0.80  CALCIUM 9.8 9.2  --   --   --   --   --   --   < > = values in this interval not displayed. Liver Function Tests:  Recent Labs Lab 10/31/14 0413 11/01/14 0334  AST 22 20  ALT 30 25  ALKPHOS 57 51  BILITOT 0.2* 0.1*  PROT 7.7 6.9  ALBUMIN 2.5* 2.4*   CBC:  Recent Labs Lab 10/31/14 0413  11/01/14 1321 11/01/14 1330 11/01/14 1425 11/01/14 1512 11/01/14 1517  WBC 8.2  --   --   --   --   --   --   HGB 10.0*  < > 8.2* 7.9* 8.8* 7.1* 8.2*  HCT 32.2*  < > 24.0* 24.5* 26.0* 21.0* 24.0*  MCV 85.4  --   --   --   --   --   --   PLT 593*  --   --  400  --   --   --   < > = values in this interval not displayed. Cardiac Enzymes:  Recent Labs Lab 10/25/14 2140 10/26/14 0353  TROPONINI 0.06* 0.07*   CBG:  Recent Labs Lab 10/28/14 0621 10/29/14 0626 10/30/14 0614 10/31/14 0610 11/01/14 0627  GLUCAP 106* 96 100* 94 99    Blood Culture (routine x 2)     Status: None   Collection Time: 10/18/14  1:11 AM  Result Value Ref Range Status   Specimen Description BLOOD RIGHT ANTECUBITAL  Final   Report Status 10/20/2014 FINAL  Final   Organism ID, Bacteria ENTEROCOCCUS SPECIES  Final      Susceptibility   Enterococcus species - MIC*    AMPICILLIN <=2 SENSITIVE Sensitive     VANCOMYCIN 1 SENSITIVE Sensitive     GENTAMICIN SYNERGY SENSITIVE Sensitive     LINEZOLID 2 SENSITIVE Sensitive     * ENTEROCOCCUS SPECIES  Blood Culture (routine x 2)     Status: None   Collection Time: 10/18/14  1:19 AM  Result  Value Ref Range Status   Specimen Description BLOOD RIGHT WRIST  Final    ENTEROCOCCUS SPECIES    Report Status 10/20/2014 FINAL  Final  Urine culture  Status: None   Collection Time: 10/18/14  2:56 AM  Result Value Ref Range Status   Specimen Description URINE, CATHETERIZED  Final   Special Requests NONE  Final   Report Status 10/20/2014 FINAL  Final   Organism ID, Bacteria ENTEROCOCCUS FAECALIS  Final      Susceptibility   Enterococcus faecalis - MIC*    AMPICILLIN <=2 SENSITIVE Sensitive     LEVOFLOXACIN 1 SENSITIVE Sensitive     NITROFURANTOIN <=16 SENSITIVE Sensitive     VANCOMYCIN 1 SENSITIVE Sensitive     LINEZOLID 2 SENSITIVE Sensitive     * 20,000 COLONIES/mL ENTEROCOCCUS FAECALIS  MRSA PCR Screening     Status: None   Collection Time: 10/18/14  5:58 PM  Result Value Ref Range Status   MRSA by PCR NEGATIVE NEGATIVE Final  Culture, blood (routine x 2)     Status: None (Preliminary result)   Collection Time: 10/20/14 11:10 AM  Result Value Ref Range Status   Specimen Description BLOOD RIGHT ARM  Final   Special Requests BOTTLES DRAWN AEROBIC AND ANAEROBIC 5CC  Final   Culture NO GROWTH 3 DAYS  Final   Report Status PENDING  Incomplete  Culture, blood (routine x 2)     Status: None (Preliminary result)   Collection Time: 10/20/14 11:26 AM  Result Value Ref Range Status   Specimen Description BLOOD LEFT HAND  Final   Special Requests BOTTLES DRAWN AEROBIC AND ANAEROBIC 5CC  Final   Culture NO GROWTH 3 DAYS  Final   Report Status PENDING  Incomplete     Scheduled Meds: . aminocaproic acid (AMICAR) for OHS   Intravenous To OR  . [MAR Hold] ampicillin (OMNIPEN) IV  2 g Intravenous Q4H  . [MAR Hold] aspirin  81 mg Oral Daily  . [MAR Hold] cefTRIAXone (ROCEPHIN)  IV  2 g Intravenous Q12H  . cefUROXime (ZINACEF)  IV  750 mg Intravenous To OR  . [MAR Hold] chlorhexidine  15 mL Mouth/Throat BID  . dexmedetomidine  0.1-0.7 mcg/kg/hr Intravenous To OR  . DOPamine  0-10  mcg/kg/min Intravenous To OR  . epinephrine  0-10 mcg/min Intravenous To OR  . [MAR Hold] feeding supplement (ENSURE ENLIVE)  237 mL Oral TID BM  . glutaraldehyde   Topical To OR  . heparin 30,000 units/NS 1000 mL solution for CELLSAVER   Other To OR  . magnesium sulfate  40 mEq Other To OR  . [MAR Hold] metoprolol tartrate  12.5 mg Oral BID  . nitroGLYCERIN  2-200 mcg/min Intravenous To OR  . potassium chloride  80 mEq Other To OR  . [MAR Hold] pramipexole  1.5 mg Oral QHS  . [MAR Hold] traZODone  50 mg Oral QHS   Continuous Infusions: . sodium chloride 100 mL/hr at 10/31/14 1811

## 2014-11-01 NOTE — Op Note (Signed)
CARDIOTHORACIC SURGERY OPERATIVE NOTE  Date of Procedure:  11/01/2014  Preoperative Diagnosis:   Bacterial Endocarditis  Severe Aortic Insufficiency  Severe Multi-vessel Coronary Artery Disease  Mild Mitral Regurgitation  Paroxysmal Atrial Fibrillation  Postoperative Diagnosis: Same  Procedure:    Aortic Valve Replacement with Aortic Root Enlargement  [facility] Ease Pericardial Tissue Valve (size 72mm, model # 3300TFX, serial # [id number])  Bovine Pericardial Patch Enlargement of Aortic Root (Nicks' technique)   Mitral Valve Repair without Annuloplasty  Debridement of vegetations adherent to anterior leaflet   Coronary Artery Bypass Grafting x 1  Left Internal Mammary Artery to Distal Left Anterior Descending Coronary Artery   Clipping of Left Atrial Appendage  Atricure left atrial clip (size 40 mm)   Surgeon: [name]. Roxy Manns, MD  Assistant: John Giovanni, PA-C  Anesthesia: Laurie Panda, MD  Operative Findings:  Bacterial endocarditis with large (2.0 cm) vegetation adherent to non-coronary cusp of aortic valve, small vegetations adherent to right coronary cusp of aortic valve and anterior leaflet of mitral valve  Severe aortic insufficiency  Mild mitral regurgitation  Normal LV systolic function  Mild to moderate LV hypertrophy  Good quality LIMA conduit for grafting  Good quality LAD target vessel for grafting  The terminal branch of the left circumflex coronary artery was too small for grafting  Trivial residual mitral regurgitation after valve repair    BRIEF CLINICAL NOTE AND INDICATIONS FOR SURGERY  Patient is a 64 year old male with history of prostate cancer and restless leg syndrome who has otherwise been in relatively good health all of his life until recently. He states that approximately 7 weeks ago he began to experience gradual onset of progressive fatigue, anorexia, low-grade fevers, chills, and night sweats. He has lost more than 20  pounds in weight and become progressively weak. He eventually began having intermittent dizzy spells. On 10/18/2014 he became acutely confused and lethargic. He was noted to have fever of 101.2 degrees Fahrenheit and petechial rash over both legs. He was brought to the emergency department where he was hypotensive on presentation but responded to fluid resuscitation. 2 of 2 sets of blood cultures obtained at the time of admission have grown enterococcus faecalis sensitive to ampicillin, vancomycin, and several other antibiotics. Transthoracic and transesophageal echocardiograms demonstrate vegetations on both aortic and mitral valve. Transesophageal echocardiogram confirms the presence of severe aortic insufficiency and mild mitral regurgitation. There are no signs of annular abscess formation or other complicating features. MRI of the brain demonstrates scattered small acute to subacute lacunar infarcts in both MCA territories and in the cerebellum consistent with septic emboli. Cardiothoracic surgical consultation was requested and the patient was initially seen in consultation on 10/20/2014.  Subsequent MRI scan of the abdomen revealed a large infarct in the spleen consistent with septic embolus.  Diagnostic cardiac catheterization revealed the presence of multivessel coronary artery disease with long segment 70% stenosis of the proximal left anterior descending coronary artery.  The patient has been seen in consultation and counseled at length regarding the indications, risks and potential benefits of surgery.  All questions have been answered, and the patient provides full informed consent for the operation as described.    DETAILS OF THE OPERATIVE PROCEDURE  Preparation:  The patient is brought to the operating room on the above mentioned date and central monitoring was established by the anesthesia team including placement of Swan-Ganz catheter and radial arterial line. The patient is placed in the  supine position on the operating table.  Intravenous  antibiotics are administered. General endotracheal anesthesia is induced uneventfully. A Foley catheter is placed.  Baseline transesophageal echocardiogram was performed.  Findings were notable for A large vegetation adherent to the ventricular surface of the non-coronary leaflet of the aortic valve. The vegetation measured in excess of 2 cm in its greatest diameter. The vegetation moved freely too and froe through the left ventricular outflow tract. There was severe aortic insufficiency. There was mild mitral regurgitation. There was thickening of the free margin of the middle scallop of the anterior leaflet of the mitral valve although no clear vegetation could be identified. There was normal left ventricular systolic function with mild to moderate left ventricular hypertrophy. No other vegetations were identified on any other valves.  The patient's chest, abdomen, both groins, and both lower extremities are prepared and draped in a sterile manner. A time out procedure is performed.   Surgical Approach and Conduit Harvest:  A median sternotomy incision was performed and the left internal mammary artery is dissected from the chest wall and prepared for bypass grafting. The left internal mammary artery is notably good quality conduit.  Following systemic heparinization, the left internal mammary artery was transected distally noted to have excellent flow.   Extracorporeal Cardiopulmonary Bypass and Myocardial Protection:  The pericardium is opened. The ascending aorta is normal in appearance. The right common femoral vein is cannulated with a Seldinger technique and a flexible guidewire advanced using TEE guidance through the right atrium into the superior vena cava. The patient is heparinized systemically. The right common femoral vein is cannulated using a long femoral venous cannula.  The ascending aorta is cannulated for cardiopulmonary bypass.   Adequate heparinization is verified.    A retrograde cardioplegia cannula is placed through the right atrium into the coronary sinus.  The operative field was continuously flooded with carbon dioxide gas.  The entire pre-bypass portion of the operation was notable for stable hemodynamics.  Cardiopulmonary bypass was begun and a second venous cannula was placed directly into the superior vena cava.  The surface of the heart inspected. Distal target vessels are selected for coronary artery bypass grafting. The terminal branch of the left circumflex coronary artery is too small for grafting.  A cardioplegia cannula is placed in the ascending aorta.  A temperature probe was placed in the interventricular septum.  The patient is cooled to 32C systemic temperature.  The aortic cross clamp is applied and cold blood cardioplegia is delivered initially in an antegrade fashion through the aortic root.   Supplemental cardioplegia is given retrograde through the coronary sinus catheter.  Iced saline slush is applied for topical hypothermia.  The initial cardioplegic arrest is rapid with early diastolic arrest.  Repeat doses of cardioplegia are administered intermittently throughout the entire cross clamp portion of the operation through the aortic root and through the coronary sinus catheter in order to maintain completely flat electrocardiogram and septal myocardial temperature below 15C.  Myocardial protection was felt to be excellent.   Coronary Artery Bypass Grafting:  The distal left anterior coronary artery was grafted with the left internal mammary artery in an end-to-side fashion.  At the site of distal anastomosis the target vessel was good quality and measured approximately 2.0 mm in diameter.   Aortic Valve Replacement with Aortic Root Enlargement and Mitral Valve Repair:  An oblique transverse aortotomy incision was performed.  The aortic valve was inspected and notable for a large friable  vegetation adherent to the non-coronary leaflet of the aortic valve. There  was a smaller vegetation adherent to the right coronary leaflet of the aortic valve. There was a perforation in the right coronary leaflet and severe aortic insufficiency. There was no sign of infection extending into the aortic annulus. The vegetations were completely excised and portion sent for routine culture and sensitivity.  The aortic valve leaflets were excised sharply and the aortic annulus examined.  The aortic annulus appeared normal with no calcification and no sign of infection.  The entire aortic root was examined. There no vegetation adherent to the ventricular surface of the anterior leaflet of the mitral valve.  The entire aortic root was irrigated copiously using a pulse lavage irrigation device. The aortic annulus is rinsed with antibiotic obtaining solution.  All surgical instruments are changed at this juncture.  A left atriotomy incision was performed posteriorly through the intra-atrial groove. The mitral valve was exposed using a self-retaining retractor. Exposure is felt to be excellent. There is a small vegetation adherent to the atrial surface of the free margin of the middle scallop (A2) of the anterior leaflet. This vegetation is debrided completely and a portion sent for routine culture.  Once the vegetation had been completely removed the mitral valve is irrigated copiously using the pulse lavage irrigation device. The leaflet appears intact and mitral valve function appears normal. Further complex repair techniques and ring annuloplasty are felt not to be necessary.  The left atriotomy incision is closed using a 2 layer closure of running 3-0 proline suture after placing a sump drain across the mitral valve to serve as a left ventricular vent.  Attention is redirected to the aortic valve and aortic root.  The aortic annulus was sized to easily accept a 21 mm prosthesis.  A 23 mm prosthesis will sit in the  annulus, but the patient's aortic root size is relatively small.  A 23 mm prosthesis is chosen because of the patient's relatively young age and active physical life style. Root enlargement to facilitate placement of a 23 mm prosthesis is planned. The aortotomy incision is extended down through the non-coronary sinus of Valsalva to the aortic annulus.  A patch of bovine pericardium is rinsed for implantation.  Aortic valve replacement was performed using interrupted horizontal mattress 2-0 Ethibond pledgeted sutures with pledgets in the subannular position.  An [facility] Ease pericardial tissue valve (size 23 mm, model # 3300TFX, serial # [id number]) was implanted uneventfully. The valve seated appropriately with adequate space beneath the left main and right coronary artery.  The patch of bovine pericardium was trimmed to an elliptical shape and utilized to close the aortotomy.  The deep portion of the patch at the level of the aortic annulus is sewn in place using multiple interrupted horizontal mattress pledgeted 4-0 proline sutures. Once the patch is secured into place at the inferior aspect, the remainder of the patch closure is completed using a 2 layer closure of running 4-0 proline suture.   Procedure Completion:  The septal myocardial temperature rose rapidly after reperfusion of the left internal mammary artery graft.  One final dose of warm retrograde "hot shot" cardioplegia was administered through the coronary sinus catheter while all air was evacuated through the aortic root.  The aortic cross clamp was removed after a total cross clamp time of 171 minutes.  The single distal coronary anastomoses were inspected for hemostasis and appropriate graft orientation.  The patch aortotomy closure is inspected for hemostasis.  Epicardial pacing wires are fixed to the right ventricular outflow tract and to  the right atrial appendage. The patient is rewarmed to 37C temperature. The aortic and left  ventricular vents were removed.  The patient is weaned and disconnected from cardiopulmonary bypass.  The patient's rhythm at separation from bypass was sinus.  The patient was weaned from cardioplegic bypass without any inotropic support. Total cardiopulmonary bypass time for the operation was 212 minutes.  Followup transesophageal echocardiogram performed after separation from bypass revealed a well-seated bioprosthetic tissue valve in the aortic position that was functioning normally.  There was no perivalvular leak.  Mean transvalvular gradient across the aortic valve was estimated 13 mmHg. The mitral valve was functioning normally with trivial residual central mitral regurgitation.  There were otherwise no changes from the preoperative exam.  The aortic and superior vena cava cannulae were removed uneventfully. Protamine was administered to reverse the anticoagulation. The femoral venous cannula was removed and manual pressure held on the groin for 30 minutes.  The mediastinum and pleural space were inspected for hemostasis and irrigated with saline solution. The mediastinum and the left pleural space were drained using 3 chest tubes placed through separate stab incisions inferiorly.  The soft tissues anterior to the aorta were reapproximated loosely. The sternum is closed with double strength sternal wire. The soft tissues anterior to the sternum were closed in multiple layers and the skin is closed with a running subcuticular skin closure.  The post-bypass portion of the operation was notable for stable rhythm and hemodynamics.  The patient received 2 units packed red blood cells during the procedure due to anemia which was present preoperatively and exacerbated by acute blood loss and hemodilution during cardiopulmonary bypass.   Disposition:  The patient tolerated the procedure well and is transported to the surgical intensive care in stable condition. There are no intraoperative complications.  All sponge instrument and needle counts are verified correct at completion of the operation.   [name]. Roxy Manns MD 11/01/2014 4:19 PM

## 2014-11-01 NOTE — OR Nursing (Signed)
14:52 - Off pump call to SICU charge nurse

## 2014-11-01 NOTE — Progress Notes (Signed)
Pt transferred to OR. Report given to anesthesia. Pt belongings sent with wife.

## 2014-11-01 NOTE — Brief Op Note (Addendum)
10/17/2014 - 11/01/2014  2:09 PM        [address]       [place] [address]             [phone]     10/17/2014 - 11/01/2014 2:10 PM  PATIENT:  Alan [name]  64 y.o. male  PRE-OPERATIVE DIAGNOSIS:  AORTIC VALVE AND MITRAL VALVE ENDOCARDITIS, AORTIC INSUFFICIENCY, MITRAL REGURGITATION CAD  POST-OPERATIVE DIAGNOSIS: AORTIC VALVE AND MITRAL VALVE ENDOCARDITIS CAD   PROCEDURE:  Procedure(s): MITRAL VALVE REPAIR , ANTERIOR LEAFLET DEBRIDEMENT CORONARY ARTERY BYPASS GRAFTING (CABG) x 1LIMA-LAD TRANSESOPHAGEAL ECHOCARDIOGRAM (TEE) AORTIC VALVE REPLACEMENT (AVR) #23 MAGNA-EASE PERICARDIAL AORTIC ROOT ENLARGEMENT WITH PERICARDIAL PATCH LEFT ATRIAL APPENDAGE CLIP  SURGEON:    Rexene Alberts, MD  ASSISTANTS:  John Giovanni, PA-C  ANESTHESIA:   Oleta Mouse, MD  CROSSCLAMP TIME:   84'  CARDIOPULMONARY BYPASS TIME: 212'  FINDINGS:  Bacterial endocarditis with large (2.0 cm) vegetation adherent to non-coronary cusp of aortic valve, small vegetations adherent to right coronary cusp of aortic valve and anterior leaflet of mitral valve  Severe aortic insufficiency  Mild mitral regurgitation  Normal LV systolic function  Mild to moderate LV hypertrophy  Good quality LIMA conduit for grafting  Good quality LAD target vessel for grafting  Trivial residual mitral regurgitation after valve repair   Aortic Valve  Procedure Performed:  Replacement: Yes.  Bioprosthetic Valve. Implant Model Number:3300TFX, Size:23, Unique Device Identifier:[id number]  Repair/Reconstruction: NO Aortic Annular Enlargement: Yes.   Aortic Valve Etiology   Aortic Insufficiency:  Severe  Aortic Valve Disease:  Yes.  Aortic Stenosis:  No.  Etiology (Choose at least one and up to  5 etiologies):  Endocarditis without root abscess  Mitral/Tricuspid/Pulmonary Valve Procedure  Mitral Valve Procedure Performed:  Repair: Other. DEBRIDEMENT   Implant: N/A    Mitral Valve Etiology  MV Insufficiency: Mild  MV Disease: Yes.  MV Stenosis: No mitral valve stenosis.  MV Disease Functional Class: MV Disease Functional Class: Type I.   Etiology (Choose at least one and up to five): Endocarditis.  MV Lesions (Choose at least one): Other.LEAFLET ENDOCARDITIS, ANTERIOR  COMPLICATIONS: None  BASELINE WEIGHT: 69 kg  PATIENT DISPOSITION:   TO SICU IN STABLE CONDITION  Rexene Alberts 11/01/2014 4:12 PM

## 2014-11-01 NOTE — Progress Notes (Signed)
  Echocardiogram Echocardiogram Transesophageal has been performed.  Donata Clay 11/01/2014, 10:35 AM

## 2014-11-01 NOTE — OR Nursing (Signed)
45 min call to SICU charge nurse [phone].

## 2014-11-02 ENCOUNTER — Other Ambulatory Visit: Payer: Self-pay

## 2014-11-02 ENCOUNTER — Encounter (HOSPITAL_COMMUNITY): Payer: Self-pay | Admitting: Thoracic Surgery (Cardiothoracic Vascular Surgery)

## 2014-11-02 ENCOUNTER — Inpatient Hospital Stay (HOSPITAL_COMMUNITY): Payer: BC Managed Care – PPO

## 2014-11-02 DIAGNOSIS — Z736 Limitation of activities due to disability: Secondary | ICD-10-CM

## 2014-11-02 LAB — POCT I-STAT 3, ART BLOOD GAS (G3+)
ACID-BASE DEFICIT: 4 mmol/L — AB (ref 0.0–2.0)
Acid-base deficit: 3 mmol/L — ABNORMAL HIGH (ref 0.0–2.0)
Bicarbonate: 21.6 mEq/L (ref 20.0–24.0)
Bicarbonate: 21.2 mEq/L (ref 20.0–24.0)
O2 SAT: 95 %
O2 Saturation: 96 %
PH ART: 7.377 (ref 7.350–7.450)
PH ART: 7.358 (ref 7.350–7.450)
TCO2: 23 mmol/L (ref 0–100)
TCO2: 22 mmol/L (ref 0–100)
pCO2 arterial: 36.6 mmHg (ref 35.0–45.0)
pCO2 arterial: 37.6 mmHg (ref 35.0–45.0)
pO2, Arterial: 78 mmHg — ABNORMAL LOW (ref 80.0–100.0)
pO2, Arterial: 83 mmHg (ref 80.0–100.0)

## 2014-11-02 LAB — GLUCOSE, CAPILLARY
GLUCOSE-CAPILLARY: 104 mg/dL — AB (ref 65–99)
GLUCOSE-CAPILLARY: 106 mg/dL — AB (ref 65–99)
GLUCOSE-CAPILLARY: 114 mg/dL — AB (ref 65–99)
GLUCOSE-CAPILLARY: 129 mg/dL — AB (ref 65–99)
Glucose-Capillary: 102 mg/dL — ABNORMAL HIGH (ref 65–99)
Glucose-Capillary: 85 mg/dL (ref 65–99)
Glucose-Capillary: 131 mg/dL — ABNORMAL HIGH (ref 65–99)
Glucose-Capillary: 174 mg/dL — ABNORMAL HIGH (ref 65–99)
Glucose-Capillary: 176 mg/dL — ABNORMAL HIGH (ref 65–99)

## 2014-11-02 LAB — POCT I-STAT, CHEM 8
BUN: 15 mg/dL (ref 6–20)
Calcium, Ion: 1.3 mmol/L (ref 1.13–1.30)
Chloride: 99 mmol/L — ABNORMAL LOW (ref 101–111)
Creatinine, Ser: 1.1 mg/dL (ref 0.61–1.24)
Glucose, Bld: 176 mg/dL — ABNORMAL HIGH (ref 65–99)
HCT: 31 % — ABNORMAL LOW (ref 39.0–52.0)
Hemoglobin: 10.5 g/dL — ABNORMAL LOW (ref 13.0–17.0)
Potassium: 4.5 mmol/L (ref 3.5–5.1)
Sodium: 133 mmol/L — ABNORMAL LOW (ref 135–145)
TCO2: 22 mmol/L (ref 0–100)

## 2014-11-02 LAB — BASIC METABOLIC PANEL
Anion gap: 6 (ref 5–15)
BUN: 13 mg/dL (ref 6–20)
CHLORIDE: 105 mmol/L (ref 101–111)
CO2: 22 mmol/L (ref 22–32)
CREATININE: 1 mg/dL (ref 0.61–1.24)
Calcium: 8.5 mg/dL — ABNORMAL LOW (ref 8.9–10.3)
GFR calc Af Amer: >60 mL/min (ref >60)
GFR calc non Af Amer: >60 mL/min (ref >60)
GLUCOSE: 126 mg/dL — AB (ref 65–99)
POTASSIUM: 4.6 mmol/L (ref 3.5–5.1)
Sodium: 133 mmol/L — ABNORMAL LOW (ref 135–145)

## 2014-11-02 LAB — PREPARE FRESH FROZEN PLASMA
UNIT DIVISION: 0
Unit division: 0

## 2014-11-02 LAB — CBC
HCT: 27 % — ABNORMAL LOW (ref 39.0–52.0)
HCT: 30 % — ABNORMAL LOW (ref 39.0–52.0)
Hemoglobin: 8.8 g/dL — ABNORMAL LOW (ref 13.0–17.0)
Hemoglobin: 9.6 g/dL — ABNORMAL LOW (ref 13.0–17.0)
MCH: 27.3 pg (ref 26.0–34.0)
MCH: 27.3 pg (ref 26.0–34.0)
MCHC: 32.6 g/dL (ref 30.0–36.0)
MCHC: 32 g/dL (ref 30.0–36.0)
MCV: 83.9 fL (ref 78.0–100.0)
MCV: 85.2 fL (ref 78.0–100.0)
PLATELETS: 291 10*3/uL (ref 150–400)
PLATELETS: 290 10*3/uL (ref 150–400)
RBC: 3.22 MIL/uL — AB (ref 4.22–5.81)
RBC: 3.52 MIL/uL — AB (ref 4.22–5.81)
RDW: 15 % (ref 11.5–15.5)
RDW: 15.4 % (ref 11.5–15.5)
WBC: 9.1 10*3/uL (ref 4.0–10.5)
WBC: 10.1 10*3/uL (ref 4.0–10.5)

## 2014-11-02 LAB — CREATININE, SERUM
CREATININE: 1.25 mg/dL — AB (ref 0.61–1.24)
GFR calc non Af Amer: 58 mL/min — ABNORMAL LOW (ref >60)

## 2014-11-02 LAB — MAGNESIUM
Magnesium: 2.5 mg/dL — ABNORMAL HIGH (ref 1.7–2.4)
Magnesium: 2.4 mg/dL (ref 1.7–2.4)

## 2014-11-02 MED ORDER — MIDAZOLAM HCL 2 MG/2ML IJ SOLN
INTRAMUSCULAR | Status: AC
Start: 2014-11-02 — End: 2014-11-02
  Administered 2014-11-02: 0.5 mg
  Filled 2014-11-02: qty 2

## 2014-11-02 MED ORDER — PANTOPRAZOLE SODIUM 40 MG PO TBEC
40.0000 mg | DELAYED_RELEASE_TABLET | Freq: Every day | ORAL | Status: DC
  Administered 2014-11-02 – 2014-11-12 (×11): 40 mg via ORAL
  Filled 2014-11-02 (×11): qty 1

## 2014-11-02 MED ORDER — PRAMIPEXOLE DIHYDROCHLORIDE 1.5 MG PO TABS
1.5000 mg | ORAL_TABLET | Freq: Every day | ORAL | Status: DC
  Administered 2014-11-02 – 2014-11-11 (×10): 1.5 mg via ORAL
  Filled 2014-11-02 (×16): qty 1

## 2014-11-02 MED ORDER — FUROSEMIDE 10 MG/ML IJ SOLN
20.0000 mg | Freq: Four times a day (QID) | INTRAMUSCULAR | Status: AC
  Administered 2014-11-02 (×3): 20 mg via INTRAVENOUS
  Filled 2014-11-02 (×3): qty 2

## 2014-11-02 MED ORDER — MORPHINE SULFATE (PF) 2 MG/ML IV SOLN
2.0000 mg | INTRAVENOUS | Status: DC | PRN
  Administered 2014-11-02: 1 mg via INTRAVENOUS
  Administered 2014-11-02: 2 mg via INTRAVENOUS
  Filled 2014-11-02 (×2): qty 1

## 2014-11-02 MED FILL — Sodium Bicarbonate IV Soln 8.4%: INTRAVENOUS | Qty: 50 | Status: AC

## 2014-11-02 MED FILL — Heparin Sodium (Porcine) Inj 1000 Unit/ML: INTRAMUSCULAR | Qty: 30 | Status: AC

## 2014-11-02 MED FILL — Mannitol IV Soln 20%: INTRAVENOUS | Qty: 500 | Status: AC

## 2014-11-02 MED FILL — Sodium Chloride IV Soln 0.9%: INTRAVENOUS | Qty: 2000 | Status: AC

## 2014-11-02 MED FILL — Electrolyte-R (PH 7.4) Solution: INTRAVENOUS | Qty: 3000 | Status: AC

## 2014-11-02 MED FILL — Lidocaine HCl IV Inj 20 MG/ML: INTRAVENOUS | Qty: 5 | Status: AC

## 2014-11-02 NOTE — Progress Notes (Signed)
    Subjective:  Denies dyspnea; chest sore   Objective:  Filed Vitals:   11/02/14 0630 11/02/14 0645 11/02/14 0700 11/02/14 0800  BP:   101/61 102/56  Pulse: 88 88 88 88  Temp: 98.8 F (37.1 C) 98.8 F (37.1 C) 98.8 F (37.1 C) 98.6 F (37 C)  TempSrc:    Core (Comment)  Resp: 26 26 25 25   Height:      Weight:      SpO2: 99% 99% 98% 99%    Intake/Output from previous day:  Intake/Output Summary (Last 24 hours) at 11/02/14 0850 Last data filed at 11/02/14 0800  Gross per 24 hour  Intake 10240.09 ml  Output   4610 ml  Net 5630.09 ml    Physical Exam: Physical exam: Well-developed well-nourished in no acute distress.  Skin is warm and dry.  HEENT is normal.  Neck is supple.  Chest is clear to auscultation with normal expansion. S/p sternotomy Cardiovascular exam is regular rate and rhythm.  Abdominal exam nontender or distended. No masses palpated. Extremities show no edema. neuro grossly intact    Lab Results: Basic Metabolic Panel:  Recent Labs  11/01/14 0334  11/01/14 2139 11/01/14 2148 11/02/14 0342  NA 132*  < > 136  --  133*  K 4.4  < > 5.1  --  4.6  CL 97*  < > 105  --  105  CO2 28  --   --   --  22  GLUCOSE 109*  < > 120*  --  126*  BUN 17  < > 14  --  13  CREATININE 1.29*  < > 0.90 1.00 1.00  CALCIUM 9.2  --   --   --  8.5*  MG  --   --   --  2.9* 2.5*  < > = values in this interval not displayed. CBC:  Recent Labs  11/01/14 2148 11/02/14 0342  WBC 10.0 9.1  HGB 7.7* 8.8*  HCT 24.5* 27.0*  MCV 85.1 83.9  PLT 258 291     Assessment/Plan:  1 SBE-continue antibiotics 2 S/P AVR and MV repair-will arrange outpatient baseline echo following DC. 3 CAD s/p CABG-continue aspirin and metoprolol; add statin. Remains in sinus. 4 Postoperative volume excess-gentle diuresis   [name] 11/02/2014, 8:50 AM

## 2014-11-02 NOTE — Progress Notes (Signed)
Patient switched back to initial settings until the patient is hemodynamically stable to wean again.

## 2014-11-02 NOTE — Procedures (Signed)
Extubation Procedure Note  Patient Details:   Name: DESHUN SEDIVY DOB: 14-Mar-1950 MRN: [id number]  Patient extubated to 4L [place]. No stridor noted. Patient in no distress at this time. NIF -25, VC 800. Patient instructed on IS X 5. Goal achieved of 250.    Evaluation  O2 sats: stable throughout Complications: No apparent complications Patient did tolerate procedure well. Bilateral Breath Sounds: Clear, Diminished   Yes  Kelle Darting 11/02/2014, 225am

## 2014-11-02 NOTE — Progress Notes (Signed)
Weaning process started. Patient placed on SIMV RR of 4, 40% FIO2 and 10 of Pressure Support. RT will continue to monitor.

## 2014-11-02 NOTE — Progress Notes (Signed)
Patient has been turn down to SIMV RR 4 and 40% FIO2 and 10 PS to start weaning.

## 2014-11-02 NOTE — Progress Notes (Signed)
[facility] BRIEF SICU PROGRESS NOTE  1 Day Post-Op  S/P Procedure(s) (LRB): MITRAL VALVE REPAIR with no Ring (N/A) CORONARY ARTERY BYPASS GRAFTING (CABG) x 1 using internal mammary artery (N/A) TRANSESOPHAGEAL ECHOCARDIOGRAM (TEE) (N/A) AORTIC VALVE REPLACEMENT (AVR) (N/A) AORTIC ROOT ENLARGEMENT (N/A)   Looks good NSR w/ stable BP O2 sats 98-100% Diuresing fairly well  Plan: Continue current plan  Rexene Alberts 11/02/2014 8:01 PM

## 2014-11-02 NOTE — Progress Notes (Signed)
Patient tolerating weaning successfully. Patient is now on PSV/CPAP 10/5 and 40% FIO2. RT will continue to monitor.

## 2014-11-02 NOTE — Progress Notes (Addendum)
[address]       [place] [address]             [phone]        CARDIOTHORACIC SURGERY PROGRESS NOTE   R1 Day Post-Op Procedure(s) (LRB): MITRAL VALVE REPAIR with no Ring (N/A) CORONARY ARTERY BYPASS GRAFTING (CABG) x 1 using internal mammary artery (N/A) TRANSESOPHAGEAL ECHOCARDIOGRAM (TEE) (N/A) AORTIC VALVE REPLACEMENT (AVR) (N/A) AORTIC ROOT ENLARGEMENT (N/A)  Subjective: Looks very good.  Expected soreness in chest.  Otherwise feels well  Objective: Vital signs: BP Readings from Last 1 Encounters:  11/02/14 102/56   Pulse Readings from Last 1 Encounters:  11/02/14 88   Resp Readings from Last 1 Encounters:  11/02/14 25   Temp Readings from Last 1 Encounters:  11/02/14 98.6 F (37 C) Core (Comment)    Hemodynamics: PAP: (17-37)/(2-23) 24/15 mmHg CO:  [3 L/min-4.3 L/min] 3.6 L/min CI:  [1.6 L/min/m2-2.3 L/min/m2] 1.9 L/min/m2  Physical Exam:  Rhythm:   sinus  Breath sounds: clear  Heart sounds:  RRR  Incisions:  Dressing dry, intact  Abdomen:  Soft, non-distended, non-tender  Extremities:  Warm, well-perfused  Chest tubes:  Low volume thin serosanguinous output, no air leak    Intake/Output from previous day: 09/07 0701 - 09/08 0700 In: 10206.2 [I.V.:6278.7; Blood:1277.5; IV Piggyback:2650] Out: 2637 [Urine:2770; Blood:1150; Chest Tube:530] Intake/Output this shift: Total I/O In: 33.9 [I.V.:33.9] Out: 160 [Urine:150; Chest Tube:10]  Lab Results:  CBC: Recent Labs  11/01/14 2148 11/02/14 0342  WBC 10.0 9.1  HGB 7.7* 8.8*  HCT 24.5* 27.0*  PLT 258 291    BMET:  Recent Labs  11/01/14 0334  11/01/14 2139 11/01/14 2148 11/02/14 0342  NA 132*  < > 136  --  133*  K 4.4  < > 5.1  --  4.6  CL 97*  < > 105  --  105  CO2 28  --   --   --  22  GLUCOSE 109*  < > 120*  --  126*  BUN 17  < > 14  --  13  CREATININE 1.29*  < > 0.90 1.00 1.00  CALCIUM 9.2  --   --   --  8.5*  < > = values in this interval not  displayed.   PT/INR:   Recent Labs  11/01/14 1650  LABPROT 18.8*  INR 1.57*    CBG (last 3)   Recent Labs  11/01/14 2348 11/02/14 0335 11/02/14 0833  GLUCAP 104* 114* 106*    ABG    Component Value Date/Time   PHART 7.358 11/02/2014 0338   PCO2ART 37.6 11/02/2014 0338   PO2ART 83.0 11/02/2014 0338   HCO3 21.2 11/02/2014 0338   TCO2 22 11/02/2014 0338   ACIDBASEDEF 4.0* 11/02/2014 0338   O2SAT 96.0 11/02/2014 0338    CXR: PORTABLE CHEST - 1 VIEW  COMPARISON: 11/01/2014.  FINDINGS: Interim removal of endotracheal tube and NG tube. Swan-Ganz catheter, mediastinal drainage catheters, left chest tube in stable position. Prior CABG and cardiac valve replacement. Cardiomegaly with diffuse bilateral pulmonary interstitial prominence and bilateral pleural effusions consistent with congestive heart failure. No pneumothorax.  IMPRESSION: 1. Interim removal of endotracheal tube and NG tube. Remaining lines and tubes including left chest tube in stable position. No pneumothorax. 2. Prior CABG and cardiac valve replacement. Cardiomegaly with diffuse pulmonary interstitial prominence and small pleural effusions consistent with congestive heart failure noted on today's exam.   Electronically Signed  By: Marcello Moores  Register  On: 11/02/2014 07:56  Assessment/Plan: S/P Procedure(s) (LRB): MITRAL VALVE REPAIR with no Ring (N/A) CORONARY ARTERY BYPASS GRAFTING (CABG) x 1 using internal mammary artery (N/A) TRANSESOPHAGEAL ECHOCARDIOGRAM (TEE) (N/A) AORTIC VALVE REPLACEMENT (AVR) (N/A) AORTIC ROOT ENLARGEMENT (N/A)  Doing well POD1 Maintaining NSR w/ stable hemodynamics on dopamine @ 3 mcg/kg/min which was started overnight Expected post op acute blood loss anemia with pre-existing normocytic anemia, Hgb up to 8.8 after transfusion 1 unit PRBC's overnight Expected post op volume excess, weight up 10 kg Expected post op atelectasis, mild Enterococcus faecalis  bacterial endocarditis complicated by severe AI and septic embolization Splenic infarction Protein-depleted malnutrition PAF - brief episode at time of initial presentation - none since - s/p clipping LA appendage   Mobilize  D/C dopamine  D/C lines   Diuresis  Continue Ampicillin + Rocephin and f/u cultures sent in [place] 11/02/2014 8:40 AM

## 2014-11-02 NOTE — Anesthesia Postprocedure Evaluation (Signed)
  Anesthesia Post-op Note  Patient: Alan [name]  Procedure(s) Performed: Procedure(s): MITRAL VALVE REPAIR with no Ring (N/A) CORONARY ARTERY BYPASS GRAFTING (CABG) x 1 using internal mammary artery (N/A) TRANSESOPHAGEAL ECHOCARDIOGRAM (TEE) (N/A) AORTIC VALVE REPLACEMENT (AVR) (N/A) AORTIC ROOT ENLARGEMENT (N/A)  Patient Location: ICU  Anesthesia Type:General  Level of Consciousness: sedated  Airway and Oxygen Therapy: Patient remains intubated per anesthesia plan  Post-op Pain: none  Post-op Assessment: Post-op Vital signs reviewed, Patient's Cardiovascular Status Stable, Respiratory Function Stable and Patent Airway              Post-op Vital Signs: Reviewed and stable  Last Vitals:  Filed Vitals:   11/02/14 0700  BP: 101/61  Pulse: 88  Temp: 37.1 C  Resp: 25    Complications: No apparent anesthesia complications

## 2014-11-03 ENCOUNTER — Inpatient Hospital Stay (HOSPITAL_COMMUNITY): Payer: BC Managed Care – PPO

## 2014-11-03 DIAGNOSIS — Z954 Presence of other heart-valve replacement: Secondary | ICD-10-CM

## 2014-11-03 LAB — CBC
HEMATOCRIT: 27.9 % — AB (ref 39.0–52.0)
Hemoglobin: 9.1 g/dL — ABNORMAL LOW (ref 13.0–17.0)
MCH: 27.8 pg (ref 26.0–34.0)
MCHC: 32.6 g/dL (ref 30.0–36.0)
MCV: 85.3 fL (ref 78.0–100.0)
Platelets: 292 10*3/uL (ref 150–400)
RBC: 3.27 MIL/uL — ABNORMAL LOW (ref 4.22–5.81)
RDW: 15.5 % (ref 11.5–15.5)
WBC: 11.9 10*3/uL — ABNORMAL HIGH (ref 4.0–10.5)

## 2014-11-03 LAB — BASIC METABOLIC PANEL
ANION GAP: 7 (ref 5–15)
BUN: 16 mg/dL (ref 6–20)
CALCIUM: 8.6 mg/dL — AB (ref 8.9–10.3)
CO2: 26 mmol/L (ref 22–32)
Chloride: 98 mmol/L — ABNORMAL LOW (ref 101–111)
Creatinine, Ser: 1.36 mg/dL — ABNORMAL HIGH (ref 0.61–1.24)
GFR calc Af Amer: >60 mL/min (ref >60)
GFR calc non Af Amer: 53 mL/min — ABNORMAL LOW (ref >60)
GLUCOSE: 98 mg/dL (ref 65–99)
POTASSIUM: 4.3 mmol/L (ref 3.5–5.1)
Sodium: 131 mmol/L — ABNORMAL LOW (ref 135–145)

## 2014-11-03 LAB — GLUCOSE, CAPILLARY
GLUCOSE-CAPILLARY: 98 mg/dL (ref 65–99)
GLUCOSE-CAPILLARY: 137 mg/dL — AB (ref 65–99)
Glucose-Capillary: 109 mg/dL — ABNORMAL HIGH (ref 65–99)
Glucose-Capillary: 88 mg/dL (ref 65–99)

## 2014-11-03 MED ORDER — SODIUM CHLORIDE 0.9 % IJ SOLN
3.0000 mL | Freq: Two times a day (BID) | INTRAMUSCULAR | Status: DC
  Administered 2014-11-03 – 2014-11-11 (×6): 3 mL via INTRAVENOUS

## 2014-11-03 MED ORDER — AMIODARONE HCL IN DEXTROSE 360-4.14 MG/200ML-% IV SOLN
INTRAVENOUS | Status: AC
  Filled 2014-11-03: qty 200

## 2014-11-03 MED ORDER — SODIUM CHLORIDE 0.9 % IJ SOLN: 3.0000 mL | INTRAMUSCULAR | Status: DC | PRN

## 2014-11-03 MED ORDER — AMIODARONE LOAD VIA INFUSION
150.0000 mg | Freq: Once | INTRAVENOUS | Status: AC
  Administered 2014-11-03: 150 mg via INTRAVENOUS

## 2014-11-03 MED ORDER — MOVING RIGHT ALONG BOOK
Freq: Once | Status: AC
  Administered 2014-11-03: 1
  Filled 2014-11-03: qty 1

## 2014-11-03 MED ORDER — AMIODARONE HCL IN DEXTROSE 360-4.14 MG/200ML-% IV SOLN
60.0000 mg/h | INTRAVENOUS | Status: AC
  Administered 2014-11-03 (×2): 60 mg/h via INTRAVENOUS
  Filled 2014-11-03: qty 200

## 2014-11-03 MED ORDER — ENSURE ENLIVE PO LIQD
237.0000 mL | Freq: Two times a day (BID) | ORAL | Status: DC
Start: 2014-11-03 — End: 2014-11-12
  Administered 2014-11-03 – 2014-11-12 (×16): 237 mL via ORAL

## 2014-11-03 MED ORDER — AMIODARONE HCL IN DEXTROSE 360-4.14 MG/200ML-% IV SOLN
30.0000 mg/h | INTRAVENOUS | Status: DC
Start: 2014-11-03 — End: 2014-11-03
  Filled 2014-11-03: qty 200

## 2014-11-03 MED ORDER — AMIODARONE HCL 200 MG PO TABS
400.0000 mg | ORAL_TABLET | Freq: Two times a day (BID) | ORAL | Status: DC
  Administered 2014-11-03 – 2014-11-08 (×11): 400 mg via ORAL
  Filled 2014-11-03 (×15): qty 2

## 2014-11-03 MED ORDER — SODIUM CHLORIDE 0.9 % IV SOLN: 250.0000 mL | INTRAVENOUS | Status: DC | PRN

## 2014-11-03 MED ORDER — ENOXAPARIN SODIUM 30 MG/0.3ML ~~LOC~~ SOLN
30.0000 mg | SUBCUTANEOUS | Status: DC
  Administered 2014-11-03 – 2014-11-05 (×3): 30 mg via SUBCUTANEOUS
  Filled 2014-11-03 (×6): qty 0.3

## 2014-11-03 MED ORDER — PRO-STAT SUGAR FREE PO LIQD
30.0000 mL | Freq: Two times a day (BID) | ORAL | Status: DC
  Administered 2014-11-03 – 2014-11-12 (×18): 30 mL via ORAL
  Filled 2014-11-03 (×23): qty 30

## 2014-11-03 MED ORDER — MIDAZOLAM HCL 2 MG/2ML IJ SOLN: 2.0000 mg | Freq: Once | INTRAMUSCULAR | Status: DC

## 2014-11-03 MED ORDER — FUROSEMIDE 40 MG PO TABS
40.0000 mg | ORAL_TABLET | Freq: Every day | ORAL | Status: AC
  Administered 2014-11-03 – 2014-11-05 (×3): 40 mg via ORAL
  Filled 2014-11-03 (×3): qty 1

## 2014-11-03 MED ORDER — POTASSIUM CHLORIDE CRYS ER 20 MEQ PO TBCR
20.0000 meq | EXTENDED_RELEASE_TABLET | Freq: Every day | ORAL | Status: DC
  Administered 2014-11-04: 20 meq via ORAL
  Filled 2014-11-03 (×2): qty 1

## 2014-11-03 MED FILL — Potassium Chloride Inj 2 mEq/ML: INTRAVENOUS | Qty: 20 | Status: AC

## 2014-11-03 MED FILL — Magnesium Sulfate Inj 50%: INTRAMUSCULAR | Qty: 10 | Status: AC

## 2014-11-03 MED FILL — Heparin Sodium (Porcine) Inj 1000 Unit/ML: INTRAMUSCULAR | Qty: 30 | Status: AC

## 2014-11-03 NOTE — Progress Notes (Signed)
Pt adamantly declines walking right now, he is concerned about going into Afib again. Encouraged him to walk this pm with RN. Helped pt set up tray to eat and discussed IS and sitting up in recliner later (refuses now). Encouraged x3 walks each day this weekend. Will f/u tomorrow. [place]ley, ACSM 2:23 PM 11/03/2014

## 2014-11-03 NOTE — Progress Notes (Signed)
11/03/2014 1315 Received pt to room 2w25 from 2S.  Pt is alert with no c/o voiced.  Tele monitor applied and CCMD notified.  Oriented pt to room, call light and bed.  Call bell in reach. [place]

## 2014-11-03 NOTE — Discharge Summary (Signed)
[address]       [place] [address]             [phone]              Discharge Summary  Name: Alan [name] DOB: [birth year] 64 y.o. 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 Restless leg syndrome   . Prostate cancer   . 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 Paroxysmal atrial fibrillation 10/19/2014  . Paroxysmal atrial flutter 10/19/2014  . Enterococcal bacteremia 10/19/2014  . 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 Protein-calorie malnutrition, severe   . Septic embolism 10/20/2014    brain and spleen, subclinical  . Coronary artery disease involving native coronary artery without angina pectoris 10/23/2014  . Stroke, embolic 6/75/9163    Multiple small subclinical embolic infarcts noted on MRI c/w septic emboli  . Hepatitis   . 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/2014    23 mm [facility] Ease bovine pericardial bioprosthetic tissue valve with bovine pericardial patch enlargement of aortic root  . 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 S/P mitral valve repair 11/01/2014    Debridement of vegetation on anterior leaflet     Procedures: DIAGNOSTIC COLONOSCOPY - 10/25/2014   MITRAL VALVE REPAIR  -  11/01/2014  Debridement of vegetations adherent to anterior leaflet CORONARY ARTERY BYPASS  GRAFTING  x 1  Left internal mammary artery to distal left anterior descending AORTIC VALVE REPLACEMENT WITH AORTIC ROOT ENLARGEMENT  Edwards Magna Ease Pericardial Tissue Valve (size 73mm)  Bovine Pericardial Patch Enlargement of Aortic Root (Nicks' technique) CLIPPING OF LEFT ATRIAL APPENDAGE  Atricure left atrial clip (size 40 mm)    HPI:  The patient is a 64 y.o. male with history of prostate cancer and restless leg syndrome who has otherwise been in relatively good health all of his life until recently. He states that approximately 7 weeks ago he began to experience gradual onset of progressive fatigue, anorexia, low-grade fevers, chills, and night sweats. He has lost more than 20 pounds in weight and become progressively weak. He eventually began having intermittent dizzy spells. On 10/18/2014, he became acutely confused and lethargic. He was noted to have fever of 101.2 degrees Fahrenheit and petechial rash over both legs. He was brought to the emergency department where he was hypotensive on presentation but responded to fluid resuscitation.  The patient was admitted to [facility] for sepsis workup.     Hospital Course:  The patient was admitted to [facility] on 10/17/2014. He was started empirically on IV Doxycycline for suspected [facility] Spotted Fever. He developed watery diarrhea with nausea, and antibiotic coverage was expanded to include Cipro and Metronidazole. Infectious Disease was consulted.  2 of 2 sets of blood cultures obtained at the time of admission grew out Enterococcus faecalis, and IV vancomycin was initiated. Antibiotic coverage was ultimately switched to Ampicillin and Rocephin. Transthoracic and transesophageal echocardiograms were performed and demonstrated  vegetations on both aortic and mitral valves. Transesophageal echocardiogram confirmed the presence of severe aortic insufficiency and mild mitral regurgitation. There were no signs of annular abscess  formation or other complicating features. MRI of the brain demonstrated scattered small acute to subacute lacunar infarcts in both MCA territories and in the cerebellum consistent with septic emboli. Cardiothoracic surgical consultation was requested. Dr. Roxy Manns saw the patient and felt that he would need surgical intervention.  Cardiac catheterization was performed on 10/23/2014 and revealed 60-70% LAD stenosis, 70% OM2 and 90% PDA.  It was recommended that the patient be seen by Gastroenterology for screening colonoscopy.  This was performed on 10/25/2014 by Dr. Paulita Fujita and did not show any source for the enterococcus.  MRI of the abdomen showed a large splenic infarction with no evidence of renal infection, pancreatitis or hepatic abscess, no cholelithiasis or biliary obstruction. Dr. Roxy Manns continue to follow the patient and discussed at length proceeding with surgery vs continued medical therapy.  All risks, benefits and alternatives of surgery were explained in detail, and the patient agreed to proceed with surgery. The patient did undergo a dental evaluation by Dr. Enrique Sack prior to surgery and wished to defer any dental treatment at this time. He remained stable and was taken to the operating room and underwent the above procedure.  Intraoperatively, a left atrial clip was placed in light of a brief episode of atrial fibrillation at the time of initial presentation.  The postoperative course was notable for atrial fibrillation which occurred on postop day 2 and was treated with IV Amiodarone.  He remained in atrial fibrillation with a controlled ventricular rate. Amiodarone drip was stopped and he was started on oral Amiodarone. He was started on low dose Coumadin.  He was transferred from the ICU to [facility] for further convalescence on 11/03/2014.  He was continued on Ampicillin and Rocephin for Enterococcus faecalis endocarditis, and this will continue for 6 weeks postop per Infectious Disease recommendations. He  has been  volume overloaded and was diuresed accordingly. He did have some hallucinations and disorientation. Patient experienced these symptoms after given Oxycodone, so this was discontinued. He had a couple of episdoes of intermittent visual hallucinations. His neuro exam was grossly intact without focal deficits. He was found to have elevated transaminases on 09/15. As a result, his Amiodarone was stopped and his LFTs have been trending down.   Otherwise, the patient is slowly and steadily progressing.  He is ambulating in the hall and is tolerating a diet.  He remains in rate controlled AFib, and his Lopressor dose has been titrated accordingly.  Incisions are healing well.  He has been weaned off supplemental oxygen. INR is trending upward at 1.7. Repeat echocardiogram on 9/15 showed EF 65-70% with no pericardial effusion.  Overall, he is medically stable on today's date for discharge home.    Recent vital signs:  Filed Vitals:   11/12/14 0448  BP: 106/70  Pulse: 80  Temp: 98 F (36.7 C)  Resp: 16    Recent laboratory studies:  CBC:  Recent Labs  11/11/14 0415 11/12/14 0420  WBC 8.2 7.3  HGB 10.3* 10.5*  HCT 33.9* 33.2*  PLT 219 201   BMET:   Recent Labs  11/11/14 0415 11/12/14 0420  NA 133* 132*  K 4.4 4.0  CL 97* 99*  CO2 29 26  GLUCOSE 101* 120*  BUN 17 17  CREATININE 1.11 1.06  CALCIUM 8.5* 8.6*    PT/INR:   Recent Labs  11/12/14  0420  LABPROT 20.0*  INR 1.70*   LFTS; Lab Results  Component Value Date   ALT 517* 11/12/2014   AST 143* 11/12/2014   ALKPHOS 67 11/12/2014   BILITOT 0.9 11/12/2014      Discharge Medications:     Medication List    TAKE these medications        ampicillin 2 g in sodium chloride 0.9 % 50 mL  Inject 2 g into the vein every 4 (four) hours.     aspirin 81 MG EC tablet  Take 1 tablet (81 mg total) by mouth daily.     cefTRIAXone 2 g in dextrose 5 % 50 mL  Inject 2 g into the vein every 12 (twelve) hours.      furosemide 40 MG tablet  Commonly known as:  LASIX  Take 1 tablet (40 mg total) by mouth daily.     metoprolol 50 MG tablet  Commonly known as:  LOPRESSOR  Take 1 tablet (50 mg total) by mouth 2 (two) times daily.     potassium chloride SA 20 MEQ tablet  Commonly known as:  K-DUR,KLOR-CON  Take 1 tablet (20 mEq total) by mouth daily.     pramipexole 1.5 MG tablet  Commonly known as:  MIRAPEX  Take 1.5 mg by mouth at bedtime.     traMADol 50 MG tablet  Commonly known as:  ULTRAM  Take 1-2 tablets (50-100 mg total) by mouth every 4 (four) hours as needed for moderate pain.     warfarin 2.5 MG tablet  Commonly known as:  COUMADIN  Take 1 tablet (2.5 mg total) by mouth daily. Or as directed by the Coumadin Clinic          The patient has been discharged on:   1.Beta Blocker:  Yes [ x  ]                              No   [   ]                              If No, reason:  2.Ace Inhibitor/ARB: Yes [   ]                                     No  [x  ]                                     If No, reason: Elevated creatinine postop  3.Statin:   Yes [   ]                  No  [  x ]                  If No, reason: Elevated LFTs  4.Ecasa:  Yes  [  x ]                  No   [   ]                  If No, reason:    Discharge Instructions:  The patient is to refrain from driving, heavy lifting or strenuous activity.  May  shower daily and clean incisions with soap and water.  May resume regular diet.   Follow Up:  Discharge Instructions    Amb Referral to Cardiac Rehabilitation    Complete by:  As directed   Congestive Heart Failure: If diagnosis is Heart Failure, patient MUST meet each of the CMS criteria: 1. Left Ventricular Ejection Fraction </= 35% 2. NYHA class II-IV symptoms despite being on optimal heart failure therapy for at least 6 weeks. 3. Stable = have not had a recent (<6 weeks) or planned (<6 months) major cardiovascular hospitalization or procedure  Program  Details: - Physician supervised classes - 1-3 classes per week over a 12-18 week period, generally for a total of 36 sessions  Physician Certification: I certify that the above Cardiac Rehabilitation treatment is medically necessary and is medically approved by me for treatment of this patient. The patient is willing and cooperative, able to ambulate and medically stable to participate in exercise rehabilitation. The participant's progress and Individualized Treatment Plan will be reviewed by the Medical Director, Cardiac Rehab staff and as indicated by the Referring/Ordering Physician.  Diagnosis:  CABG           Follow-up Information    Follow up with CROITORU,MIHAI, MD In 2 weeks.   Specialty:  Cardiology   Why:  Office will arrange follow up   Contact information:   [address] [place] [address] [phone]       Follow up with Rexene Alberts, MD On 12/11/2014.   Specialty:  Cardiothoracic Surgery   Why:  PA/LAT CXR to be taken (at [place] which is in the same building as Dr. Guy Sandifer office) on 12/11/2014 at 12:15 pm;Appointment time is at 1:00 pm   Contact information:   [place] [address] [phone]       Follow up with [name], MD.   Specialty:  Family Medicine   Why:  Call for a follow up appointment regarding further surveillance of HGA1C 6.4 (borderline diabetes)   Contact information:   [place] [address] [phone]       Follow up with Home Health.   Why:  Please draw PT and INR (bloodwork for Coumadin) on Monday 11/13/2014 and call or fax results to Dr. Victorino December office      Follow up with [facility].   Why:  Home health RN will be arranged for IV antibiotics   Contact information:   [place] [address] [phone]        COLLINS,GINA H PA-C 11/12/2014, 8:41 AM

## 2014-11-03 NOTE — Progress Notes (Addendum)
Nutrition Follow-up/Consult   DOCUMENTATION CODES:   Severe malnutrition in context of acute illness/injury  INTERVENTION:   Ensure Enlive po BID, each supplement provides 350 kcal and 20 grams of protein  Prostat liquid protein po 30 ml BID with meals, each supplement provides 100 kcal, 15 grams protein  NUTRITION DIAGNOSIS:   Malnutrition related to acute illness as evidenced by severe depletion of body fat, severe depletion of muscle mass, percent weight loss, energy intake < or equal to 50% for > or equal to 5 days, ongoing  GOAL:   Patient will meet greater than or equal to 90% of their needs, progressing  MONITOR:   PO intake, Supplement acceptance, Labs, Weight trends, I & O's  ASSESSMENT:   64 y.o. Male with PMH of prostate cancer, RLS, who presents result in mental status, dizziness and a rash.  Pt s/p procedure 9/7: AORTIC VALVE REPLACEMENT WITH AORTIC ROOT ENLARGEMENT MITRAL VALVE REPAIR CORONARY ARTERY BYPASS GRAFTING x 1  Pt feeling shaky and weak upon RD visit.  Per RN, ate breakfast this AM.  PO intake variable at 30-50% per flowsheet records.  Pt was receiving Ensure Enlive prior to surgery.  Will re-order.  Also add additional liquid protein supplement (ie Prostat).  Diet Order:  Diet heart healthy/carb modified Room service appropriate?: Yes; Fluid consistency:: Thin  Skin:  Reviewed, no issues  Last BM:  9/9  Height:   Ht Readings from Last 1 Encounters:  11/01/14 6' (1.829 m)    Weight:   Wt Readings from Last 1 Encounters:  11/03/14 176 lb 9.4 oz (80.1 kg)    Ideal Body Weight:  81 kg  BMI:  Body mass index is 23.94 kg/(m^2).  Estimated Nutritional Needs:   Kcal:  2100-2300  Protein:  110-125 grams  Fluid:  > 2.1 L/day  EDUCATION NEEDS:   No education needs identified at this time  Arthur Holms, RD, LDN Pager #: [phone] After-Hours Pager #: [phone]

## 2014-11-03 NOTE — Progress Notes (Signed)
During ambulation patient went into a-fib RVR, which intermittently jumped up to the 140s for the first ten minutes the rhythm change occured.  No issues with BP, but patient reports feeling flutter in chest and feeling weak and shaky.  Per MD, cordis to remain in place and amiodarone protocol started.  Dr. Roxy Manns said to transfer patient as long as HR remains near 110 bpm.  Bolus given and continuous infusion started.  Patient has remained in the high 90s/low 100s for the past 45 minutes.  Also, patient reports feeling much better.  Will proceed with transfer.  Bobette Mo

## 2014-11-03 NOTE — Progress Notes (Signed)
[address]       [place] [address]             [phone]        CARDIOTHORACIC SURGERY PROGRESS NOTE   R2 Days Post-Op Procedure(s) (LRB): MITRAL VALVE REPAIR with no Ring (N/A) CORONARY ARTERY BYPASS GRAFTING (CABG) x 1 using internal mammary artery (N/A) TRANSESOPHAGEAL ECHOCARDIOGRAM (TEE) (N/A) AORTIC VALVE REPLACEMENT (AVR) (N/A) AORTIC ROOT ENLARGEMENT (N/A)  Subjective: Feels well  Objective: Vital signs: BP Readings from Last 1 Encounters:  11/03/14 99/66   Pulse Readings from Last 1 Encounters:  11/03/14 89   Resp Readings from Last 1 Encounters:  11/03/14 20   Temp Readings from Last 1 Encounters:  11/03/14 98.5 F (36.9 C) Oral    Hemodynamics: PAP: (30)/(25) 30/25 mmHg  Physical Exam:  Rhythm:   sinus  Breath sounds: clear  Heart sounds:  RRR  Incisions:  Dressing dry, intact  Abdomen:  Soft, non-distended, non-tender  Extremities:  Warm, well-perfused    Intake/Output from previous day: 09/08 0701 - 09/09 0700 In: 1627.7 [P.O.:1000; I.V.:227.7; IV Piggyback:400] Out: 2280 [Urine:2230; Chest Tube:50] Intake/Output this shift:    Lab Results:  CBC: Recent Labs  11/02/14 1540 11/02/14 1609 11/03/14 0520  WBC 10.1  --  11.9*  HGB 9.6* 10.5* 9.1*  HCT 30.0* 31.0* 27.9*  PLT 290  --  292    BMET:  Recent Labs  11/02/14 0342  11/02/14 1609 11/03/14 0520  NA 133*  --  133* 131*  K 4.6  --  4.5 4.3  CL 105  --  99* 98*  CO2 22  --   --  26  GLUCOSE 126*  --  176* 98  BUN 13  --  15 16  CREATININE 1.00  < > 1.10 1.36*  CALCIUM 8.5*  --   --  8.6*  < > = values in this interval not displayed.   PT/INR:   Recent Labs  11/01/14 1650  LABPROT 18.8*  INR 1.57*    CBG (last 3)   Recent Labs  11/02/14 1935 11/02/14 2358 11/03/14 0355  GLUCAP 176* 88 98    ABG    Component Value Date/Time   PHART 7.358 11/02/2014 0338   PCO2ART 37.6 11/02/2014 0338   PO2ART 83.0 11/02/2014 0338   HCO3  21.2 11/02/2014 0338   TCO2 22 11/02/2014 1609   ACIDBASEDEF 4.0* 11/02/2014 0338   O2SAT 96.0 11/02/2014 0338    CXR: PORTABLE CHEST - 1 VIEW  COMPARISON: 11/02/2014 .  FINDINGS: Interim removal of Swan-Ganz catheter, left chest tube, mediastinal drainage catheter. Right IJ sheath in stable position. Prior cardiac valve replacement. Left atrial appendage clip noted. Low lung volumes with basilar atelectasis. Improving pulmonary interstitial prominence suggesting improving pulmonary interstitial edema . Small pleural effusions again noted. No pneumothorax identified. Right pulmonary apex not imaged.  IMPRESSION: 1. Interim removal of Swan-Ganz catheter, left chest tube, and mediastinal drainage catheter. Right IJ sheath in stable position 2. Prior median sternotomy. Cardiac valve replacement. Left atrial appendage clip noted. Stable cardiomegaly. 3. Interim partial clearing of pulmonary interstitial edema. Low lung volumes with bibasilar subsegmental atelectasis. Small pleural effusions again noted.   Electronically Signed  By: Marcello Moores Register  On: 11/03/2014 07:57  Assessment/Plan: S/P Procedure(s) (LRB): MITRAL VALVE REPAIR with no Ring (N/A) CORONARY ARTERY BYPASS GRAFTING (CABG) x 1 using internal mammary artery (N/A) TRANSESOPHAGEAL ECHOCARDIOGRAM (TEE) (N/A) AORTIC VALVE REPLACEMENT (AVR) (N/A) AORTIC ROOT ENLARGEMENT (N/A)  Doing well POD2 Maintaining NSR w/ stable BP O2 sats  93-96% on 2 L/min Expected post op acute blood loss anemia with pre-existing normocytic anemia, Hgb up to 9.1  Expected post op volume excess, UOP adequate Expected post op atelectasis, mild Enterococcus faecalis bacterial endocarditis complicated by severe AI and septic embolization Splenic infarction Protein-depleted malnutrition PAF - brief episode at time of initial presentation - none since - s/p clipping LA appendage   Mobilize  Diuresis  Continue Ampicillin and  Rocephin and f/u cultures from OR  Transfer step down  Rexene Alberts 11/03/2014 8:33 AM

## 2014-11-04 ENCOUNTER — Inpatient Hospital Stay (HOSPITAL_COMMUNITY): Payer: BC Managed Care – PPO

## 2014-11-04 LAB — TYPE AND SCREEN
ABO/RH(D): B POS
Antibody Screen: NEGATIVE
Unit division: 0
Unit division: 0
Unit division: 0
Unit division: 0
Unit division: 0
Unit division: 0

## 2014-11-04 LAB — CBC
HCT: 27.6 % — ABNORMAL LOW (ref 39.0–52.0)
Hemoglobin: 8.9 g/dL — ABNORMAL LOW (ref 13.0–17.0)
MCH: 27.8 pg (ref 26.0–34.0)
MCHC: 32.2 g/dL (ref 30.0–36.0)
MCV: 86.3 fL (ref 78.0–100.0)
PLATELETS: 258 10*3/uL (ref 150–400)
RBC: 3.2 MIL/uL — ABNORMAL LOW (ref 4.22–5.81)
RDW: 15.6 % — ABNORMAL HIGH (ref 11.5–15.5)
WBC: 11.2 10*3/uL — ABNORMAL HIGH (ref 4.0–10.5)

## 2014-11-04 LAB — BASIC METABOLIC PANEL
Anion gap: 8 (ref 5–15)
BUN: 18 mg/dL (ref 6–20)
CHLORIDE: 97 mmol/L — AB (ref 101–111)
CO2: 26 mmol/L (ref 22–32)
CREATININE: 1.14 mg/dL (ref 0.61–1.24)
Calcium: 8.6 mg/dL — ABNORMAL LOW (ref 8.9–10.3)
GFR calc Af Amer: >60 mL/min (ref >60)
GLUCOSE: 120 mg/dL — AB (ref 65–99)
Potassium: 3.9 mmol/L (ref 3.5–5.1)
SODIUM: 131 mmol/L — AB (ref 135–145)

## 2014-11-04 MED ORDER — SODIUM CHLORIDE 0.9 % IJ SOLN
10.0000 mL | INTRAMUSCULAR | Status: DC | PRN
Start: 2014-11-04 — End: 2014-11-12
  Administered 2014-11-07 – 2014-11-08 (×2): 10 mL
  Filled 2014-11-04 (×2): qty 40

## 2014-11-04 MED ORDER — LIP MEDEX EX OINT: TOPICAL_OINTMENT | CUTANEOUS | Status: DC | PRN

## 2014-11-04 MED ORDER — WHITE PETROLATUM GEL
Status: DC | PRN
  Administered 2014-11-05: 0.2 via TOPICAL
  Filled 2014-11-04: qty 1

## 2014-11-04 NOTE — Progress Notes (Addendum)
      [address]       [place] [address]             [phone]      3 Days Post-Op Procedure(s) (LRB): MITRAL VALVE REPAIR with no Ring (N/A) CORONARY ARTERY BYPASS GRAFTING (CABG) x 1 using internal mammary artery (N/A) TRANSESOPHAGEAL ECHOCARDIOGRAM (TEE) (N/A) AORTIC VALVE REPLACEMENT (AVR) (N/A) AORTIC ROOT ENLARGEMENT (N/A)   Subjective:  Alan [name] complains of not sleeping last night and nausea.  He states he just doesn't feel well over all.  He is ambulating in the hallways with assistance.  + BM  Objective: Vital signs in last 24 hours: Temp:  [97.8 F (36.6 C)-98.9 F (37.2 C)] 98.5 F (36.9 C) (09/10 0451) Pulse Rate:  [67-123] 73 (09/10 0451) Cardiac Rhythm:  [-] Normal sinus rhythm (09/10 0844) Resp:  [18-36] 20 (09/10 0451) BP: (95-121)/(63-83) 106/68 mmHg (09/10 0451) SpO2:  [93 %-98 %] 93 % (09/10 0451) Weight:  [178 lb (80.74 kg)] 178 lb (80.74 kg) (09/10 0451)  Intake/Output from previous day: 09/09 0701 - 09/10 0700 In: 1634.7 [P.O.:990; I.V.:394.7; IV Piggyback:250] Out: 440 [Urine:440]  General appearance: alert, cooperative and no distress Heart: regular rate and rhythm Lungs: clear to auscultation bilaterally Abdomen: soft, non-tender; bowel sounds normal; no masses,  no organomegaly Extremities: edema minimal  Wound: clean and dry  Lab Results:  Recent Labs  11/03/14 0520 11/04/14 0311  WBC 11.9* 11.2*  HGB 9.1* 8.9*  HCT 27.9* 27.6*  PLT 292 258   BMET:  Recent Labs  11/03/14 0520 11/04/14 0311  NA 131* 131*  K 4.3 3.9  CL 98* 97*  CO2 26 26  GLUCOSE 98 120*  BUN 16 18  CREATININE 1.36* 1.14  CALCIUM 8.6* 8.6*    PT/INR:  Recent Labs  11/01/14 1650  LABPROT 18.8*  INR 1.57*   ABG    Component Value Date/Time   PHART 7.358 11/02/2014 0338   HCO3 21.2 11/02/2014 0338   TCO2 22 11/02/2014 1609   ACIDBASEDEF 4.0* 11/02/2014 0338   O2SAT 96.0 11/02/2014 0338   CBG (last 3)   Recent  Labs  11/03/14 0355 11/03/14 0839 11/03/14 1215  GLUCAP 98 109* 137*    Assessment/Plan: S/P Procedure(s) (LRB): MITRAL VALVE REPAIR with no Ring (N/A) CORONARY ARTERY BYPASS GRAFTING (CABG) x 1 using internal mammary artery (N/A) TRANSESOPHAGEAL ECHOCARDIOGRAM (TEE) (N/A) AORTIC VALVE REPLACEMENT (AVR) (N/A) AORTIC ROOT ENLARGEMENT (N/A)  1. CV- I/O A. Fib overnight, currently NSR- on Amiodarone 400 mg BID, Lopressor at 12.5 mg- would benefit from increase in Lopressor dose however pressure is labile mainly in the 90s and he would likely not tolerate the increase 2. Pulm- wean oxygen as tolerated 3. Renal- creatinine trending down, remains hypervolemic continue Lasix 4.  ID- Endocarditis- OR cultures remain negative- continue Ampicillin and Rocephin 5. Dispo- patient was in/out A. Fib all night- currently in NSR on Amiodarone, Lopressor- if continues to have A. Fib, may need to start anticoagulation vs. NOAC, wean oxygen, continue diuresis, continue current care  LOS: 17 days    Alan [name], Alan [name] 11/04/2014   Chart reviewed, patient examined, agree with above. He has had recurrent atrial fib overnight but back in sinus now. He has a left atrial clip but should probably be on coumadin if he has any more atrial fib. NOAC not an option with an aortic valve prosthesis.

## 2014-11-04 NOTE — Progress Notes (Signed)
CARDIAC REHAB PHASE I   PRE:  Rate/Rhythm: 80 sr  BP:  Supine:   Sitting:115/72  Standing:   SaO2: 93 2l  MODE:  Ambulation: 150 ft   POST:  Rate/Rhythm: 90 SR  BP:  Supine:   Sitting: 136/88  Standing:    SaO2: 93 2l Very slow moving, encouraged to take deep breaths and use IS hourly.  Needs much encouragement.  Remained in SR while walking.  This is his second walk today, instructed to walk 3 times per day.  Not appropriate to educate today, no family present.   [id number] Liliane Channel RN, BSN 11/04/2014 3:10 PM

## 2014-11-04 NOTE — Progress Notes (Signed)
Peripherally Inserted Central Catheter/Midline Placement  The IV Nurse has discussed with the patient and/or persons authorized to consent for the patient, the purpose of this procedure and the potential benefits and risks involved with this procedure.  The benefits include less needle sticks, lab draws from the catheter and patient may be discharged home with the catheter.  Risks include, but not limited to, infection, bleeding, blood clot (thrombus formation), and puncture of an artery; nerve damage and irregular heat beat.  Alternatives to this procedure were also discussed.  PICC/Midline Placement Documentation  PICC / Midline Single Lumen 34/28/76 PICC Right Basilic 41 cm 0 cm (Active)  Indication for Insertion or Continuance of Line Home intravenous therapies (PICC only);Prolonged intravenous therapies 11/04/2014 12:42 PM  Exposed Catheter (cm) 0 cm 11/04/2014 12:42 PM  Site Assessment Clean;Dry;Intact 11/04/2014 12:42 PM  Line Status Flushed;Saline locked;Blood return noted 11/04/2014 12:42 PM  Dressing Type Transparent 11/04/2014 12:42 PM  Dressing Status Clean;Dry;Intact;Antimicrobial disc in place 11/04/2014 12:42 PM  Line Care Connections checked and tightened 11/04/2014 12:42 PM  Line Adjustment (NICU/IV Team Only) No 11/04/2014 12:42 PM  Dressing Intervention New dressing 11/04/2014 12:42 PM  Dressing Change Due 11/11/14 11/04/2014 12:42 PM       Rolena Infante 11/04/2014, 12:43 PM

## 2014-11-04 NOTE — Progress Notes (Signed)
   11/04/14 0900  Mobility  Activity Ambulate in hall;Bedside Commode;Chair  Level of Assistance Modified independent, requires aide device or extra time  Assistive Device BSC;Front wheel walker  Distance Ambulated (ft) 50 ft  Ambulation Response Tolerated fair  Pt is very anxious about walking, afraid that he will go into afib and get dizzy. Assisted to ambulate very slowly 50 feet in hall with oxygen and rolling walker. His hr stayed in the 80's and pt reassured that he is doing well. He is dyspneic on exertion and his lower breath sounds are very diminished. Encouraged to use IS at least every hour.

## 2014-11-05 LAB — TISSUE CULTURE
GRAM STAIN: NONE SEEN
Gram Stain: NONE SEEN
Gram Stain: NONE SEEN

## 2014-11-05 MED ORDER — COUMADIN BOOK
Freq: Once | Status: AC
  Administered 2014-11-05: 12:00:00
  Filled 2014-11-05: qty 1

## 2014-11-05 MED ORDER — POTASSIUM CHLORIDE CRYS ER 20 MEQ PO TBCR
20.0000 meq | EXTENDED_RELEASE_TABLET | Freq: Every day | ORAL | Status: DC
  Administered 2014-11-05: 20 meq via ORAL
  Filled 2014-11-05: qty 1

## 2014-11-05 MED ORDER — WARFARIN - PHYSICIAN DOSING INPATIENT
Freq: Every day | Status: DC
  Administered 2014-11-05: 18:00:00

## 2014-11-05 MED ORDER — METOCLOPRAMIDE HCL 5 MG/ML IJ SOLN
10.0000 mg | Freq: Four times a day (QID) | INTRAMUSCULAR | Status: DC
  Administered 2014-11-05 (×2): 10 mg via INTRAVENOUS
  Filled 2014-11-05 (×2): qty 2

## 2014-11-05 MED ORDER — METOPROLOL TARTRATE 25 MG PO TABS
25.0000 mg | ORAL_TABLET | Freq: Two times a day (BID) | ORAL | Status: DC
  Administered 2014-11-05 – 2014-11-10 (×10): 25 mg via ORAL
  Filled 2014-11-05 (×12): qty 1

## 2014-11-05 MED ORDER — WARFARIN SODIUM 2.5 MG PO TABS
2.5000 mg | ORAL_TABLET | Freq: Every day | ORAL | Status: DC
  Administered 2014-11-05: 2.5 mg via ORAL
  Filled 2014-11-05 (×2): qty 1

## 2014-11-05 MED ORDER — WARFARIN VIDEO
Freq: Once | Status: AC
  Administered 2014-11-05: 12:00:00

## 2014-11-05 NOTE — Progress Notes (Signed)
   11/05/14 1030  Mobility  Activity Ambulate in hall  Level of Assistance Modified independent, requires aide device or extra time  Assistive Device Front wheel walker  Distance Ambulated (ft) 30 ft  Ambulation Response Tolerated poorly  Pt became light-headed/dizzy and near syncopal after few steps outside his room. Assisted to chair, and vital signs taken. BP was 140/85, pulse 114, O2 100% on 2L. Returned to room. Will monitor closely.

## 2014-11-05 NOTE — Progress Notes (Addendum)
      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 continues to not feel well.  He states nausea is pretty bad due to A. Fib, but worst is when rate is high.  He is ambulating with assistance.  + BM  Objective: Vital signs in last 24 hours: Temp:  [97.4 F (36.3 C)-97.7 F (36.5 C)] 97.6 F (36.4 C) (09/11 0423) Pulse Rate:  [69-79] 72 (09/11 0423) Cardiac Rhythm:  [-] Atrial fibrillation (09/11 0700) Resp:  [18-19] 18 (09/11 0423) BP: (103-116)/(58-73) 103/66 mmHg (09/11 0423) SpO2:  [95 %-97 %] 95 % (09/11 0423) Weight:  [180 lb 5.4 oz (81.8 kg)] 180 lb 5.4 oz (81.8 kg) (09/11 0423)  Intake/Output from previous day: 09/10 0701 - 09/11 0700 In: 360 [P.O.:360] Out: 150 [Urine:150]  General appearance: alert, cooperative and no distress Heart: irregularly irregular rhythm Lungs: clear to auscultation bilaterally Abdomen: soft, non-tender; bowel sounds normal; no masses,  no organomegaly Extremities: edema trace Wound: clean and dry  Lab Results:  Recent Labs  11/03/14 0520 11/04/14 0311  WBC 11.9* 11.2*  HGB 9.1* 8.9*  HCT 27.9* 27.6*  PLT 292 258   BMET:  Recent Labs  11/03/14 0520 11/04/14 0311  NA 131* 131*  K 4.3 3.9  CL 98* 97*  CO2 26 26  GLUCOSE 98 120*  BUN 16 18  CREATININE 1.36* 1.14  CALCIUM 8.6* 8.6*    PT/INR: No results for input(s): LABPROT, INR in the last 72 hours. 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 CV- A. Fib, rate in the 90s- continue Amiodarone, Lopressor will titrate as pressure allows, start low dose Coumadin 2. Pulm- weaning oxygen as tolerated, continue IS 3. Renal- creatinine was WNL, remains hypervolemic on Lasix, Potassium low normal on supplements- will repeat BMET in AM 4. 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 GI- nausea on zofran, will add Reglan- however could be related to Amiodarone 6. Dispo- patient now in rate controlled A. Fib, will start Coumadin, add second agent for nausea and monitor, repeat BMET in AM   LOS: 18 days    BARRETT, ERIN 11/05/2014   Chart reviewed, patient examined, agree with above. I agree that he needs coumadin with new aortic valve and a-fib even though he has an atrial clip.

## 2014-11-05 NOTE — Progress Notes (Signed)
Pt does not tolerate when heart rate increases to the 110s-120s. Briefly with activity. He becomes very anxious. Most of the time, pt heart rate in the 80s SR with frequent PACS.

## 2014-11-05 NOTE — Discharge Instructions (Addendum)
Information on my medicine - Coumadin®   (Warfarin) ° °This medication education was reviewed with me or my healthcare representative as part of my discharge preparation.  The pharmacist that spoke with me during my hospital stay was:  Markle, Jennifer Sue, RPH ° °Why was Coumadin prescribed for you? °Coumadin was prescribed for you because you have a blood clot or a medical condition that can cause an increased risk of forming blood clots. Blood clots can cause serious health problems by blocking the flow of blood to the heart, lung, or brain. Coumadin can prevent harmful blood clots from forming. °As a reminder your indication for Coumadin is:   Stroke Prevention Because Of Atrial Fibrillation ° °What test will check on my response to Coumadin? °While on Coumadin (warfarin) you will need to have an INR test regularly to ensure that your dose is keeping you in the desired range. The INR (international normalized ratio) number is calculated from the result of the laboratory test called prothrombin time (PT). ° °If an INR APPOINTMENT HAS NOT ALREADY BEEN MADE FOR YOU please schedule an appointment to have this lab work done by your health care provider within 7 days. °Your INR goal is usually a number between:  2 to 3 or your provider may give you a more narrow range like 2-2.5.  Ask your health care provider during an office visit what your goal INR is. ° °What  do you need to  know  About  COUMADIN? °Take Coumadin (warfarin) exactly as prescribed by your healthcare provider about the same time each day.  DO NOT stop taking without talking to the doctor who prescribed the medication.  Stopping without other blood clot prevention medication to take the place of Coumadin may increase your risk of developing a new clot or stroke.  Get refills before you run out. ° °What do you do if you miss a dose? °If you miss a dose, take it as soon as you remember on the same day then continue your regularly scheduled regimen the  next day.  Do not take two doses of Coumadin at the same time. ° °Important Safety Information °A possible side effect of Coumadin (Warfarin) is an increased risk of bleeding. You should call your healthcare provider right away if you experience any of the following: °? Bleeding from an injury or your nose that does not stop. °? Unusual colored urine (red or dark brown) or unusual colored stools (red or black). °? Unusual bruising for unknown reasons. °? A serious fall or if you hit your head (even if there is no bleeding). ° °Some foods or medicines interact with Coumadin® (warfarin) and might alter your response to warfarin. To help avoid this: °? Eat a balanced diet, maintaining a consistent amount of Vitamin K. °? Notify your provider about major diet changes you plan to make. °? Avoid alcohol or limit your intake to 1 drink for women and 2 drinks for men per day. °(1 drink is 5 oz. wine, 12 oz. beer, or 1.5 oz. liquor.) ° °Make sure that ANY health care provider who prescribes medication for you knows that you are taking Coumadin (warfarin).  Also make sure the healthcare provider who is monitoring your Coumadin knows when you have started a new medication including herbals and non-prescription products. ° °Coumadin® (Warfarin)  Major Drug Interactions  °Increased Warfarin Effect Decreased Warfarin Effect  °Alcohol (large quantities) °Antibiotics (esp. Septra/Bactrim, Flagyl, Cipro) °Amiodarone (Cordarone) °Aspirin (ASA) °Cimetidine (Tagamet) °Megestrol (Megace) °NSAIDs (ibuprofen,   naproxen, etc.) Piroxicam (Feldene) Propafenone (Rythmol SR) Propranolol (Inderal) Isoniazid (INH) Posaconazole (Noxafil) Barbiturates (Phenobarbital) Carbamazepine (Tegretol) Chlordiazepoxide (Librium) Cholestyramine (Questran) Griseofulvin Oral Contraceptives Rifampin Sucralfate (Carafate) Vitamin K   Coumadin (Warfarin) Major Herbal Interactions  Increased Warfarin Effect Decreased Warfarin Effect   Garlic Ginseng Ginkgo biloba Coenzyme Q10 Green tea St. Johns wort    Coumadin (Warfarin) FOOD Interactions  Eat a consistent number of servings per week of foods HIGH in Vitamin K (1 serving =  cup)  Collards (cooked, or boiled & drained) Kale (cooked, or boiled & drained) Mustard greens (cooked, or boiled & drained) Parsley *serving size only =  cup Spinach (cooked, or boiled & drained) Swiss chard (cooked, or boiled & drained) Turnip greens (cooked, or boiled & drained)  Eat a consistent number of servings per week of foods MEDIUM-HIGH in Vitamin K (1 serving = 1 cup)  Asparagus (cooked, or boiled & drained) Broccoli (cooked, boiled & drained, or raw & chopped) Brussel sprouts (cooked, or boiled & drained) *serving size only =  cup Lettuce, raw (green leaf, endive, romaine) Spinach, raw Turnip greens, raw & chopped   These websites have more information on Coumadin (warfarin):  FailFactory.se; VeganReport.com.au;  Aortic Valve Replacement, Care After Refer to this sheet in the next few weeks. These instructions provide you with information on caring for yourself after your procedure. Your health care provider may also give you specific instructions. Your treatment has been planned according to current medical practices, but problems sometimes occur. Call your health care provider if you have any problems or questions after your procedure. HOME CARE INSTRUCTIONS   Take medicines only as directed by your health care provider.  If your health care provider has prescribed elastic stockings, wear them as directed.  Take frequent naps or rest often throughout the day.  Avoid lifting over 10 lbs (4.5 kg) or pushing or pulling things with your arms for 6-8 weeks or as directed by your health care provider.  Avoid driving or airplane travel for 4-6 weeks after surgery or as directed by your health care provider. If you are riding in a car for an extended  period, stop every 1-2 hours to stretch your legs. Keep a record of your medicines and medical history with you when traveling.  Do not drive or operate heavy machinery while taking pain medicine. (narcotics).  Do not cross your legs.  Do not use any tobacco products including cigarettes, chewing tobacco, or electronic cigarettes. If you need help quitting, ask your health care provider.  Do not take baths, swim, or use a hot tub until your health care provider approves. Take showers once your health care provider approves. Pat incisions dry. Do not rub incisions with a washcloth or towel.  Avoid climbing stairs and using the handrail to pull yourself up for the first 2-3 weeks after surgery.  Return to work as directed by your health care provider.  Drink enough fluid to keep your urine clear or pale yellow.  Do not strain to have a bowel movement. Eat high-fiber foods if you become constipated. You may also take a medicine to help you have a bowel movement (laxative) as directed by your health care provider.  Resume sexual activity as directed by your health care provider. Men should not use medicines for erectile dysfunction until their doctor says it isokay.  If you had a certain type of heart condition in the past, you may need to take antibiotic medicine before having dental work or surgery.  Let your dentist and health care providers know if you had one or more of the following:  Previous endocarditis.  An artificial (prosthetic) heart valve.  Congenital heart disease. SEEK MEDICAL CARE IF:  You develop a skin rash.   You experience sudden changes in your weight.  You have a fever. SEEK IMMEDIATE MEDICAL CARE IF:   You develop chest pain that is not coming from your incision.  You have drainage (pus), redness, swelling, or pain at your incision site.   You develop shortness of breath or have difficulty breathing.   You have increased bleeding from your incision  site.   You develop light-headedness.  MAKE SURE YOU:   Understand these directions.  Will watch your condition.  Will get help right away if you are not doing well or get worse. Document Released: 08/29/2004 Document Revised: 06/27/2013 Document Reviewed: 11/25/2011 [facility] Patient Information 2015 [place], [place]. This information is not intended to replace advice given to you by your health care provider. Make sure you discuss any questions you have with your health care provider.

## 2014-11-05 NOTE — Progress Notes (Signed)
Pt walked 138ft this am, O2 sat was 98% on 2L. Pt went into AFIB while ambulating, HR 114 unsustained, Pt assisted back to room and in bed, call light placed within reach

## 2014-11-05 NOTE — Progress Notes (Signed)
   11/05/14 1800  Mobility  Activity Ambulate in hall  Level of Assistance Modified independent, requires aide device or extra time  Assistive Device Front wheel walker  Distance Ambulated (ft) 150 ft  Ambulation Response Tolerated well  Pt stated that he felt much better after the administration of oxycodone. He was able to complete one full lap around the circle.

## 2014-11-06 LAB — GLUCOSE, CAPILLARY: GLUCOSE-CAPILLARY: 88 mg/dL (ref 65–99)

## 2014-11-06 LAB — ANAEROBIC CULTURE
GRAM STAIN: NONE SEEN
GRAM STAIN: NONE SEEN

## 2014-11-06 LAB — PROTIME-INR
INR: 2.08 — AB (ref 0.00–1.49)
PROTHROMBIN TIME: 23.2 s — AB (ref 11.6–15.2)

## 2014-11-06 LAB — BASIC METABOLIC PANEL
ANION GAP: 10 (ref 5–15)
BUN: 25 mg/dL — ABNORMAL HIGH (ref 6–20)
CALCIUM: 8.5 mg/dL — AB (ref 8.9–10.3)
CO2: 26 mmol/L (ref 22–32)
CREATININE: 1.22 mg/dL (ref 0.61–1.24)
Chloride: 96 mmol/L — ABNORMAL LOW (ref 101–111)
Glucose, Bld: 86 mg/dL (ref 65–99)
Potassium: 4.6 mmol/L (ref 3.5–5.1)
SODIUM: 132 mmol/L — AB (ref 135–145)

## 2014-11-06 MED ORDER — WARFARIN SODIUM 2 MG PO TABS: 2.0000 mg | ORAL_TABLET | Freq: Every day | ORAL | Status: DC

## 2014-11-06 MED ORDER — LEVALBUTEROL HCL 0.63 MG/3ML IN NEBU
0.6300 mg | INHALATION_SOLUTION | Freq: Three times a day (TID) | RESPIRATORY_TRACT | Status: DC | PRN
  Administered 2014-11-07 – 2014-11-08 (×2): 0.63 mg via RESPIRATORY_TRACT
  Filled 2014-11-06 (×2): qty 3

## 2014-11-06 MED ORDER — WARFARIN SODIUM 1 MG PO TABS
1.0000 mg | ORAL_TABLET | Freq: Every day | ORAL | Status: DC
  Administered 2014-11-06 – 2014-11-08 (×3): 1 mg via ORAL
  Filled 2014-11-06 (×4): qty 1

## 2014-11-06 MED ORDER — ASPIRIN EC 81 MG PO TBEC
81.0000 mg | DELAYED_RELEASE_TABLET | Freq: Every day | ORAL | Status: DC
  Administered 2014-11-06 – 2014-11-12 (×7): 81 mg via ORAL
  Filled 2014-11-06 (×7): qty 1

## 2014-11-06 MED ORDER — FUROSEMIDE 10 MG/ML IJ SOLN
40.0000 mg | Freq: Once | INTRAMUSCULAR | Status: AC
  Administered 2014-11-06: 40 mg via INTRAVENOUS
  Filled 2014-11-06: qty 4

## 2014-11-06 NOTE — Care Management Note (Addendum)
Case Management Note Marvetta Gibbons RN, BSN Unit 2W-Case Manager [phone]  Patient Details  Name: Alan [name] MRN: [id number] Date of Birth: [birth year]  Subjective/Objective:    Pt admitted with enterococcal bacteremia- plan for AVR/CABG 11/01/14               Action/Plan: PTA pt lived at home- NCM to follow post op for d/c needs/planning  Expected Discharge Date:                  Expected Discharge Plan:  [place]  In-House Referral:     Discharge planning Services  CM Consult  Post Acute Care Choice:    Choice offered to:     DME Arranged:    DME Agency:     HH Arranged:    [place] Agency:     Status of Service:  In process, will continue to follow  Medicare Important Message Given:    Date Medicare IM Given:    Medicare IM give by:    Date Additional Medicare IM Given:    Additional Medicare Important Message give by:     If discussed at [place] of Stay Meetings, dates discussed:  10/31/14, 11/07/14  Additional Comments:  11/06/14- pt 5 days post op remains on IV abx for endocarditis, plan to remove EPW in AM. NCM to follow for d/c needs  Dawayne Patricia, RN 11/06/2014, 10:07 AM

## 2014-11-06 NOTE — Progress Notes (Signed)
CARDIAC REHAB PHASE I   PRE:  Rate/Rhythm: 90 afib, 102 ST    BP: sitting 131/84    SaO2: 92 2L  MODE:  Ambulation: 150 ft   POST:  Rate/Rhythm: 90 SR with PACs    BP: sitting 105/64     SaO2: 95 2L  Pt apparently groggy this am from pain meds. Able to follow commands but he struggled to form thoughts. Pt SOB in bed, SAO2 92 RA therefore did not try to wean O2. Pt able to get to EOB and stand fairly independently. Used RW and 2L to ambulate, assist x1 with gait belt. Pt with slow steps, c/o dizziness. Did not seem as SOB walking and SAO2 95 2L after walk. BP was lower. To recliner on chair alarm. Encouraged more walking today, increasing distance when he is clearer thinking. Will f/u tomorrow. [phone]   Josephina Shih [place] CES, ACSM 11/06/2014 11:14 AM

## 2014-11-06 NOTE — Progress Notes (Signed)
Utilization review completed.  

## 2014-11-06 NOTE — Progress Notes (Signed)
11/06/2014 1730 Nursing note Dr. Roxy Manns paged and updated on pt. Mental status. Pt. Feels that his mentation is clearing up at this time however is not completely back to baseline. Pt. Able to answer orientation questions appropriately and neurological check WNL and consistent with prior findings. Dr. Roxy Manns paged and made aware of all findings as well of pt. With drop of SBP with cardiac rehab today with ambulation from 135 to 105 and complaints of dizziness. No new orders at this time. Only to continue to monitor patient condition and ensure oxycodone removed from [facility]. Orders enacted and patient and family updated on plan of care. Pt. And family verbalized understanding.  Alan [name], Arville Lime

## 2014-11-06 NOTE — Progress Notes (Addendum)
      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 He denies nausea this am.  Objective: Vital signs in last 24 hours: Temp:  [97.5 F (36.4 C)-98.5 F (36.9 C)] 98.5 F (36.9 C) (09/12 0415) Pulse Rate:  [84-100] 100 (09/12 0415) Cardiac Rhythm:  [-] Atrial fibrillation (09/12 0744) Resp:  [18-20] 20 (09/12 0415) BP: (89-153)/(61-93) 153/93 mmHg (09/12 0415) SpO2:  [91 %-97 %] 93 % (09/12 0415) Weight:  [184 lb 4.9 oz (83.6 kg)] 184 lb 4.9 oz (83.6 kg) (09/12 0415)  Pre op weight 69 kg Current Weight  11/06/14 184 lb 4.9 oz (83.6 kg)      Intake/Output from previous day: 09/11 0701 - 09/12 0700 In: 73 [P.O.:460] Out: 300 [Urine:300]   Physical Exam:  Cardiovascular: IRRR IRRR Pulmonary: Expiratory wheezing Abdomen: Soft, non tender, bowel sounds present. Extremities: Mild bilateral lower extremity edema. Wounds: Clean and dry.  No erythema or signs of infection.  Lab Results: CBC: Recent Labs  11/04/14 0311  WBC 11.2*  HGB 8.9*  HCT 27.6*  PLT 258   BMET:  Recent Labs  11/04/14 0311 11/06/14 0543  NA 131* 132*  K 3.9 4.6  CL 97* 96*  CO2 26 26  GLUCOSE 120* 86  BUN 18 25*  CREATININE 1.14 1.22  CALCIUM 8.6* 8.5*    PT/INR:  Lab Results  Component Value Date   INR 2.08* 11/06/2014   INR 1.57* 11/01/2014   INR 1.73* 11/01/2014   ABG:  INR: Will add last result for INR, ABG once components are confirmed Will add last 4 CBG results once components are confirmed  Assessment/Plan:  1. CV - A fib with HR in the 90's . On Amiodarone 400 mg bid, Lopressor 25 mg bid, and Coumadin.  INR 2.08 this am. On Lovenox so will stop. Will decrease Coumadin to 1 mg daily. 2.  Pulmonary - On 2 liters of oxygen via [place]. Wean to room air as tolerates. Encourage incentive spirometer. Xopenex PRN wheezing. 3. 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  Acute blood loss anemia - H and H yesterday 8.9 and 27.6 5. Volume overload-will give Lasix IV this am 6. Remove EPW in am  ZIMMERMAN,DONIELLE MPA-C 11/06/2014,8:01 AM  I have seen and examined the patient and agree with the assessment and plan as outlined.  Rexene Alberts 11/06/2014 8:41 AM

## 2014-11-06 NOTE — Progress Notes (Signed)
11/06/2014 1130 Nursing note Pt. With confusion and dizziness after am oxycodone administration. Neuro check WNL otherwise. Pt. Aware of confusion stating that he knows his thinking is not clear since he took the medication. Lars Pinks PAC on floor and verbal orders received ok to d/c oxycodone from [facility]. Orders enacted. Pt. Updated on plan of care. Bed alarm and safety measures in place to ensure patient safety at this time. Pt. Verbalizes understanding of measures. Will continue to monitor patient.  Special Ranes, Arville Lime

## 2014-11-07 LAB — PROTIME-INR
INR: 2.59 — AB (ref 0.00–1.49)
Prothrombin Time: 27.4 seconds — ABNORMAL HIGH (ref 11.6–15.2)

## 2014-11-07 MED ORDER — GUAIFENESIN ER 600 MG PO TB12
600.0000 mg | ORAL_TABLET | Freq: Two times a day (BID) | ORAL | Status: DC
  Administered 2014-11-07 – 2014-11-12 (×11): 600 mg via ORAL
  Filled 2014-11-07 (×12): qty 1

## 2014-11-07 NOTE — Progress Notes (Signed)
11/07/2014 10:52 AM Nursing note Verbal orders Lars Pinks not to d/c EPW as previously ordered today due to elevated INR to wait until tomorrow 9/14. PA updated orders in EPIC to reflect this information. Pt. Updated on plan of care. Verbalized understanding.  Rogenia Werntz, Arville Lime

## 2014-11-07 NOTE — Progress Notes (Signed)
[facility]  Patient Status: New pt to [facility] this admission  [facility] is providing the following services: HHRN and Home Infusion Pharmacy for home IV ABX. [facility] hospital infusion coordinator will support in hospital teaching to support independence at home with IV ABX administration.  If patient discharges after hours, please call [phone].   Larry Sierras 11/07/2014, 4:07 PM

## 2014-11-07 NOTE — Care Management Note (Signed)
Case Management Note Marvetta Gibbons RN, BSN Unit 2W-Case Manager [phone]  Patient Details  Name: Alan [name] MRN: [id number] Date of Birth: 01-09-1951  Subjective/Objective:    Pt admitted with enterococcal bacteremia- plan for AVR/CABG 11/01/14               Action/Plan: PTA pt lived at home- NCM to follow post op for d/c needs/planning  Expected Discharge Date:                  Expected Discharge Plan:  [place]  In-House Referral:     Discharge planning Services  CM Consult  Post Acute Care Choice:  Home Health Choice offered to:  Patient, Spouse  DME Arranged:    DME Agency:     HH Arranged:  RN, IV Antibiotics HH Agency:  [facility]  Status of Service:  In process, will continue to follow  Medicare Important Message Given:    Date Medicare IM Given:    Medicare IM give by:    Date Additional Medicare IM Given:    Additional Medicare Important Message give by:     If discussed at [place] of Stay Meetings, dates discussed:  10/31/14, 11/07/14  Additional Comments:  11/07/14- Marvetta Gibbons RN, BSN - per notes pt will need 6 wks from date of surgery of IV abx- pt will need HH-RN for IV abx- spoke with pt and wife regarding agency of choice- per conversations with both they have chosen [facility] for [facility] services- referral has been made to both [facility] and [place] with [facility]  For [facility] RN and IV abx needs. Need HH RN order from MD along with prescription for IV abx and duration. Pt already has PICC in place. Pt also interested in RW and 3n1 for home- will need DME orders for them as well. NCM to continue to follow   11/06/14- pt 5 days post op remains on IV abx for endocarditis, plan to remove EPW in AM. NCM to follow for d/c needs  Dahlia Client Romeo Rabon, RN 11/07/2014, 2:20 PM

## 2014-11-07 NOTE — Progress Notes (Addendum)
Pt complaining of hallucinations. RN completed full neurological assessment; no changes noted.   Pt having SOB/wheezing. Oxygen sat on RA 89%.  Pt placed on 2L N/C and received PRN Xopenex. Pt breathing improved and oxygen sat increased to 96%.  Dr. Roxy Manns paged to make him aware of pt neurological status. No new orders given at this time. VSS. RN will continue to monitor patient closely.

## 2014-11-07 NOTE — Progress Notes (Signed)
11/07/2014 3:23 PM Nursing note Pt. Ambulated 150 ft with RW, RN and on RA. Pt. Tolerated well. Encouraged one more walk this evening. Will continue to closely monitor patient. Siearra Amberg, Arville Lime

## 2014-11-07 NOTE — Progress Notes (Signed)
CARDIAC REHAB PHASE I   PRE:  Rate/Rhythm: 99 SR c/ PACs  BP:  Sitting: 102/73        SaO2: 95 RA  MODE:  Ambulation: 240 ft   POST:  Rate/Rhythm: 106 ST  BP:  Sitting: 95/67         SaO2: 95 RA  Pt eager to ambulate. Pt sats 95% on RA at rest, weaned from 2L O2. Pt ambulated 240 ft on RA, IV, rolling walker, assist x1, slow, steady gait, tolerated well. Pt did c/o DOE, fatigue, denies CP, dizziness, brief standing rest x1.  Pt sats 94-96% on RA during ambulation, HR 110s. Pt to recliner after walk, on RA, call bell within reach, eating lunch. Encouraged ambulation x 2 more today, pt verbalized understanding. Will continue to follow.   [phone]  Lenna Sciara, RN, BSN 11/07/2014 12:19 PM

## 2014-11-07 NOTE — Progress Notes (Addendum)
      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 Had episode of confusion and patient aware that he did not "feel right" after given Oxycodone. This was stopped and felt "more clear". He also had ortho stasis and was symptomatic. He has complaints of productive cough this am.  Objective: Vital signs in last 24 hours: Temp:  [97.7 F (36.5 C)-98.4 F (36.9 C)] 97.7 F (36.5 C) (09/13 0624) Pulse Rate:  [83-115] 115 (09/13 0624) Cardiac Rhythm:  [-] Atrial fibrillation (09/12 1922) Resp:  [18-20] 18 (09/13 0624) BP: (104-141)/(66-90) 129/89 mmHg (09/13 0624) SpO2:  [97 %-100 %] 100 % (09/13 0624) Weight:  [184 lb 14.4 oz (83.87 kg)] 184 lb 14.4 oz (83.87 kg) (09/13 0624)  Pre op weight 69 kg Current Weight  11/07/14 184 lb 14.4 oz (83.87 kg)      Intake/Output from previous day: 09/12 0701 - 09/13 0700 In: 250 [P.O.:240; I.V.:10] Out: 1725 [Urine:1725]   Physical Exam:  Cardiovascular: IRRR IRRR Pulmonary: Expiratory wheezing Abdomen: Soft, non tender, bowel sounds present. Extremities: Mild bilateral lower extremity edema. Wounds: Clean and dry.  No erythema or signs of infection.  Lab Results: CBC:No results for input(s): WBC, HGB, HCT, PLT in the last 72 hours. BMET:   Recent Labs  11/06/14 0543  NA 132*  K 4.6  CL 96*  CO2 26  GLUCOSE 86  BUN 25*  CREATININE 1.22  CALCIUM 8.5*    PT/INR:  Lab Results  Component Value Date   INR 2.59* 11/07/2014   INR 2.08* 11/06/2014   INR 1.57* 11/01/2014   ABG:  INR: Will add last result for INR, ABG once components are confirmed Will add last 4 CBG results once  components are confirmed  Assessment/Plan:  1. CV - A fib with HR in the low 100's . On Amiodarone 400 mg bid, Lopressor 25 mg bid, and Coumadin. INR increased from 2.08 to 2.59 this am. On Coumadin 1 mg and will continue tonight. 2.  Pulmonary - On 2 liters of oxygen via [place]. Wean to room air as tolerates. Encourage incentive spirometer. Mucinex for cough 3. 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  Acute blood loss anemia - H and H yesterday 8.9 and 27.6 5. Volume overload-will give Lasix IV this am 6. Remove EPW in am as INR increased   ZIMMERMAN,DONIELLE MPA-C 11/07/2014,8:12 AM   I have seen and examined the patient and agree with the assessment and plan as outlined.  Rexene Alberts 11/07/2014 2:07 PM

## 2014-11-08 LAB — CBC
HCT: 30 % — ABNORMAL LOW (ref 39.0–52.0)
Hemoglobin: 9.4 g/dL — ABNORMAL LOW (ref 13.0–17.0)
MCH: 28.4 pg (ref 26.0–34.0)
MCHC: 31.3 g/dL (ref 30.0–36.0)
MCV: 90.6 fL (ref 78.0–100.0)
PLATELETS: 194 10*3/uL (ref 150–400)
RBC: 3.31 MIL/uL — AB (ref 4.22–5.81)
RDW: 18.2 % — ABNORMAL HIGH (ref 11.5–15.5)
WBC: 12.8 10*3/uL — ABNORMAL HIGH (ref 4.0–10.5)

## 2014-11-08 LAB — BASIC METABOLIC PANEL
Anion gap: 9 (ref 5–15)
BUN: 28 mg/dL — AB (ref 6–20)
CO2: 29 mmol/L (ref 22–32)
CREATININE: 1.23 mg/dL (ref 0.61–1.24)
Calcium: 8.5 mg/dL — ABNORMAL LOW (ref 8.9–10.3)
Chloride: 94 mmol/L — ABNORMAL LOW (ref 101–111)
GFR calc Af Amer: >60 mL/min (ref >60)
GFR, EST NON AFRICAN AMERICAN: 59 mL/min — AB (ref >60)
Glucose, Bld: 106 mg/dL — ABNORMAL HIGH (ref 65–99)
POTASSIUM: 3.9 mmol/L (ref 3.5–5.1)
SODIUM: 132 mmol/L — AB (ref 135–145)

## 2014-11-08 LAB — PROTIME-INR
INR: 2.13 — AB (ref 0.00–1.49)
PROTHROMBIN TIME: 23.7 s — AB (ref 11.6–15.2)

## 2014-11-08 MED ORDER — POTASSIUM CHLORIDE CRYS ER 20 MEQ PO TBCR
20.0000 meq | EXTENDED_RELEASE_TABLET | Freq: Two times a day (BID) | ORAL | Status: DC
  Administered 2014-11-08 – 2014-11-12 (×9): 20 meq via ORAL
  Filled 2014-11-08 (×9): qty 1

## 2014-11-08 MED ORDER — FUROSEMIDE 40 MG PO TABS
40.0000 mg | ORAL_TABLET | Freq: Two times a day (BID) | ORAL | Status: DC
  Administered 2014-11-08 (×2): 40 mg via ORAL
  Filled 2014-11-08 (×2): qty 1

## 2014-11-08 NOTE — Progress Notes (Signed)
Pt ambulated 143ft with RW, RN and on RA. Pt tolerated well. Will continue to monitor. Rachael Fee, RN

## 2014-11-08 NOTE — Progress Notes (Signed)
CARDIAC REHAB PHASE I   PRE:  Rate/Rhythm: 89 a.fib  BP:  Sitting: 122/69        SaO2: 95 2L  MODE:  Ambulation: 150 ft   POST:  Rate/Rhythm: 98 a. fib  BP:  Sitting: 125/73         SaO2: 97 2L  Pt up in chair, states he just woke up. Pt states he does not remember being confused or hallucinating last night. Pt ambulated 150 ft on 2L O2, rolling walker, gait belt, IV, assist x1, steady gait, tolerated fair. Pt did c/o dizziness at beginning of walk, improved with ambulation. Pt denies CP, DOE, but states "I can feel myself breathing," declined rest stop. Upon returning to room pt denies dizziness, VSS.  Pt to bed per pt request after walk,states he wants to take a nap, call bell within reach. Encouraged IS, additional ambulation x2 today. Pt verbalized understanding.   [phone]  Lenna Sciara, RN, BSN 11/08/2014 10:35 AM

## 2014-11-08 NOTE — Progress Notes (Signed)
EPWs pulled per protocol, no ectopy or problems noted at this time.  Instructed on need for 1hr of bedrest and monitoring of vital signs.  Will continue to monitor.

## 2014-11-08 NOTE — Progress Notes (Addendum)
      [address]       [place] [address]             [phone]        7 Days Post-Op Procedure(s) (LRB): MITRAL VALVE REPAIR with no Ring (N/A) CORONARY ARTERY BYPASS GRAFTING (CABG) x 1 using internal mammary artery (N/A) TRANSESOPHAGEAL ECHOCARDIOGRAM (TEE) (N/A) AORTIC VALVE REPLACEMENT (AVR) (N/A) AORTIC ROOT ENLARGEMENT (N/A)  Subjective: Patient apparently had hallucinations last evening. He is awake, alert, oriented this am.  Objective: Vital signs in last 24 hours: Temp:  [98.1 F (36.7 C)-98.5 F (36.9 C)] 98.2 F (36.8 C) (09/14 0409) Pulse Rate:  [82-118] 82 (09/14 0409) Cardiac Rhythm:  [-] Normal sinus rhythm (09/14 0700) Resp:  [18-19] 19 (09/14 0409) BP: (90-117)/(59-78) 113/78 mmHg (09/14 0409) SpO2:  [90 %-93 %] 92 % (09/14 0409) Weight:  [187 lb 4.8 oz (84.959 kg)] 187 lb 4.8 oz (84.959 kg) (09/14 0409)  Pre op weight 69 kg Current Weight  11/08/14 187 lb 4.8 oz (84.959 kg)      Intake/Output from previous day: 09/13 0701 - 09/14 0700 In: 480 [P.O.:480] Out: 200 [Urine:200]   Physical Exam:  Cardiovascular: IRRR IRRR Pulmonary: Slightly diminished at bases Abdomen: Soft, non tender, bowel sounds present. Extremities: Mild bilateral lower extremity edema. Wounds: Clean and dry.  No erythema or signs of infection. Neurologic: No focal deficits  Lab Results: CBC:  Recent Labs  11/08/14 0425  WBC 12.8*  HGB 9.4*  HCT 30.0*  PLT 194   BMET:   Recent Labs  11/06/14 0543 11/08/14 0425  NA 132* 132*  K 4.6 3.9  CL 96* 94*  CO2 26 29  GLUCOSE 86 106*  BUN 25* 28*  CREATININE 1.22 1.23  CALCIUM 8.5* 8.5*    PT/INR:  Lab Results  Component Value Date   INR 2.13* 11/08/2014   INR 2.59* 11/07/2014   INR 2.08* 11/06/2014   ABG:  INR: Will add last result for INR, ABG once components are confirmed Will add last 4 CBG results once components are confirmed  Assessment/Plan:  1. CV - A fib with HR in  the low 90's . On Amiodarone 400 mg bid, Lopressor 25 mg bid, and Coumadin. INR decreased from 2.59 to 2.13 this am. On Coumadin 1 mg and will continue. 2.  Pulmonary - On 2 liters of oxygen via [place]. Wean to room air as tolerates. Encourage incentive spirometer. Mucinex for cough 3. 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  Acute blood loss anemia - H and H yesterday 8.9 and 27.6 5. Volume overload-will give Lasix orally two times today 6. Remove EPW as INR decreased 7. Regarding hallucinations, not on Oxy. Has Ultram PRN but has not had it lately. Monitor  ZIMMERMAN,DONIELLE MPA-C 11/08/2014,8:04 AM   I have seen and examined the patient and agree with the assessment and plan as outlined.  Intermittent visual hallucinations (which are reportedly very mild) could be due to numerous different things.  I feel that there is no reason to pursue this further at this time.  Mr Mcgrady remains quite weak, which is expected given the severely debilitated condition prior to surgery.    Rexene Alberts 11/08/2014 2:15 PM

## 2014-11-09 ENCOUNTER — Inpatient Hospital Stay (HOSPITAL_COMMUNITY): Payer: BC Managed Care – PPO

## 2014-11-09 DIAGNOSIS — I340 Nonrheumatic mitral (valve) insufficiency: Secondary | ICD-10-CM

## 2014-11-09 LAB — COMPREHENSIVE METABOLIC PANEL
ALBUMIN: 2.4 g/dL — AB (ref 3.5–5.0)
ALT: 1068 U/L — ABNORMAL HIGH (ref 17–63)
ANION GAP: 6 (ref 5–15)
AST: 514 U/L — AB (ref 15–41)
Alkaline Phosphatase: 101 U/L (ref 38–126)
BILIRUBIN TOTAL: 0.8 mg/dL (ref 0.3–1.2)
BUN: 22 mg/dL — AB (ref 6–20)
CHLORIDE: 97 mmol/L — AB (ref 101–111)
CO2: 32 mmol/L (ref 22–32)
Calcium: 8.5 mg/dL — ABNORMAL LOW (ref 8.9–10.3)
Creatinine, Ser: 1.25 mg/dL — ABNORMAL HIGH (ref 0.61–1.24)
GFR calc Af Amer: >60 mL/min (ref >60)
GFR calc non Af Amer: 58 mL/min — ABNORMAL LOW (ref >60)
GLUCOSE: 103 mg/dL — AB (ref 65–99)
POTASSIUM: 4.1 mmol/L (ref 3.5–5.1)
SODIUM: 135 mmol/L (ref 135–145)
TOTAL PROTEIN: 5.7 g/dL — AB (ref 6.5–8.1)

## 2014-11-09 LAB — CBC
HEMATOCRIT: 32.3 % — AB (ref 39.0–52.0)
Hemoglobin: 9.7 g/dL — ABNORMAL LOW (ref 13.0–17.0)
MCH: 27.3 pg (ref 26.0–34.0)
MCHC: 30 g/dL (ref 30.0–36.0)
MCV: 91 fL (ref 78.0–100.0)
PLATELETS: 215 10*3/uL (ref 150–400)
RBC: 3.55 MIL/uL — ABNORMAL LOW (ref 4.22–5.81)
RDW: 18.8 % — AB (ref 11.5–15.5)
WBC: 12 10*3/uL — AB (ref 4.0–10.5)

## 2014-11-09 LAB — PREALBUMIN: Prealbumin: 6 mg/dL — ABNORMAL LOW (ref 18–38)

## 2014-11-09 LAB — PROTIME-INR
INR: 1.96 — AB (ref 0.00–1.49)
PROTHROMBIN TIME: 22.2 s — AB (ref 11.6–15.2)

## 2014-11-09 MED ORDER — WARFARIN SODIUM 2 MG PO TABS
2.0000 mg | ORAL_TABLET | Freq: Every day | ORAL | Status: DC
  Administered 2014-11-09: 2 mg via ORAL
  Filled 2014-11-09: qty 2

## 2014-11-09 MED ORDER — FUROSEMIDE 40 MG PO TABS
40.0000 mg | ORAL_TABLET | Freq: Two times a day (BID) | ORAL | Status: DC
  Administered 2014-11-10 – 2014-11-12 (×5): 40 mg via ORAL
  Filled 2014-11-09 (×5): qty 1

## 2014-11-09 MED ORDER — FUROSEMIDE 10 MG/ML IJ SOLN
40.0000 mg | Freq: Two times a day (BID) | INTRAMUSCULAR | Status: AC
  Administered 2014-11-09 (×2): 40 mg via INTRAVENOUS
  Filled 2014-11-09 (×2): qty 4

## 2014-11-09 NOTE — Progress Notes (Signed)
Utilization review completed.  

## 2014-11-09 NOTE — Progress Notes (Addendum)
      [address]       [place] [address]             [phone]        8 Days Post-Op Procedure(s) (LRB): MITRAL VALVE REPAIR with no Ring (N/A) CORONARY ARTERY BYPASS GRAFTING (CABG) x 1 using internal mammary artery (N/A) TRANSESOPHAGEAL ECHOCARDIOGRAM (TEE) (N/A) AORTIC VALVE REPLACEMENT (AVR) (N/A) AORTIC ROOT ENLARGEMENT (N/A)  Subjective: Patient about to eat breakfast this am. He states he feels he is slowly making progress. He thinks his breathing is better. No specific complaints this am.  Objective: Vital signs in last 24 hours: Temp:  [97.7 F (36.5 C)-98.2 F (36.8 C)] 97.9 F (36.6 C) (09/15 0405) Pulse Rate:  [83-104] 83 (09/15 0405) Cardiac Rhythm:  [-] Normal sinus rhythm;Atrial fibrillation (09/14 1945) Resp:  [18] 18 (09/15 0405) BP: (96-120)/(63-74) 111/63 mmHg (09/15 0405) SpO2:  [96 %-98 %] 98 % (09/15 0405) Weight:  [185 lb 14.4 oz (84.324 kg)] 185 lb 14.4 oz (84.324 kg) (09/15 0405)  Pre op weight 69 kg Current Weight  11/09/14 185 lb 14.4 oz (84.324 kg)      Intake/Output from previous day: 09/14 0701 - 09/15 0700 In: 240 [P.O.:240] Out: 200 [Urine:200]   Physical Exam:  Cardiovascular: IRRR IRRR Pulmonary: Slightly diminished at bases Abdomen: Soft, non tender, bowel sounds present. Extremities: Bilateral lower extremity and ankle edema. Wounds: Clean and dry.  No erythema or signs of infection. Neurologic: No focal deficits  Lab Results: CBC:  Recent Labs  11/08/14 0425 11/09/14 0527  WBC 12.8* 12.0*  HGB 9.4* 9.7*  HCT 30.0* 32.3*  PLT 194 215   BMET:   Recent Labs  11/08/14 0425 11/09/14 0527  NA 132* 135  K 3.9 4.1  CL 94* 97*  CO2 29 32  GLUCOSE 106* 103*  BUN 28* 22*  CREATININE 1.23 1.25*  CALCIUM 8.5* 8.5*    PT/INR:  Lab Results  Component Value Date   INR 1.96* 11/09/2014   INR 2.13* 11/08/2014   INR 2.59* 11/07/2014   ABG:  INR: Will add last result for INR, ABG once  components are confirmed Will add last 4 CBG results once components are confirmed  Assessment/Plan:  1. CV - A fib with HR in the low 90's . On Amiodarone 400 mg bid, Lopressor 25 mg bid, and Coumadin. INR decreased from 2.13 to 1.96 this am. On Coumadin 1 mg so will increase to 2 mg. 2.  Pulmonary - On 2 liters of oxygen via [place]. Wean to room air as tolerates. Encourage incentive spirometer. Mucinex for cough 3. 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  Acute blood loss anemia - H and H stable at 9.7 and 32.3 5. Volume overload-will give Lasix IV two times today 6. Creatinine is stable at 1.25 7. Deconditioned-continue with CRPI  ZIMMERMAN,DONIELLE MPA-C 11/09/2014,7:57 AM    I have seen and examined the patient and agree with the assessment and plan as outlined.  Serum transaminases now markedly elevated - will stop amiodarone.  There are no other obvious possible causes for sudden development of abnormal liver enzymes.  Protein-depletion remains profound with serum albumin only 2.4.  Will get f/u ECHO r/o pericardial effusion.  Rexene Alberts 11/09/2014 1:37 PM

## 2014-11-09 NOTE — Progress Notes (Signed)
CARDIAC REHAB PHASE I   PRE:  Rate/Rhythm: 35 SR with PAC every other beat    BP: sitting 117/68    SaO2: 96 2L  MODE:  Ambulation: 640 ft   POST:  Rate/Rhythm: 90 SR with PACs (less than at rest)    BP: sitting 111/46     SaO2: 96 RA  Tolerated very well, strength much improved. Increased distance off O2, no c/o. Used RW, no assist needed. Maintained SR with PACs. Left O2 off. Pt wants to walk again later.  [place], [place], ACSM 11/09/2014 2:24 PM

## 2014-11-09 NOTE — Progress Notes (Signed)
  Echocardiogram 2D Echocardiogram has been performed.  Jennette Dubin 11/09/2014, 5:02 PM

## 2014-11-09 NOTE — Progress Notes (Signed)
Nutrition Follow-up/Consult  DOCUMENTATION CODES:   Severe malnutrition in context of acute illness/injury  INTERVENTION:   Continue Ensure Enlive po BID, each supplement provides 350 kcal and 20 grams of protein   Continue Prostat liquid protein po 30 ml BID with meals, each supplement provides 100 kcal, 15 grams protein  NUTRITION DIAGNOSIS:   Malnutrition related to acute illness as evidenced by severe depletion of body fat, severe depletion of muscle mass, percent weight loss, energy intake < or equal to 50% for > or equal to 5 days, ongoing  GOAL:   Patient will meet greater than or equal to 90% of their needs, progressing  MONITOR:   PO intake, Supplement acceptance, Labs, Weight trends, I & O's  ASSESSMENT:   64 y.o. Male with PMH of prostate cancer, RLS, who presents result in mental status, dizziness and a rash.  Pt s/p procedure 9/7: AORTIC VALVE REPLACEMENT WITH AORTIC ROOT ENLARGEMENT MITRAL VALVE REPAIR CORONARY ARTERY BYPASS GRAFTING x 1  Patient reports his appetite is pretty good.  PO intake poor at 25% per flowsheet records.  Malnutrition ongoing.  States he has been drinking Ensure Enlive and taking Prostat liquid protein.  Encouraged him to continue with his supplements.  Diet Order:  Diet heart healthy/carb modified Room service appropriate?: Yes; Fluid consistency:: Thin  Skin:  Reviewed, no issues  Last BM:  9/15  Height:   Ht Readings from Last 1 Encounters:  11/01/14 6' (1.829 m)    Weight:   Wt Readings from Last 1 Encounters:  11/09/14 185 lb 14.4 oz (84.324 kg)    Ideal Body Weight:  81 kg  BMI:  Body mass index is 25.21 kg/(m^2).  Estimated Nutritional Needs:   Kcal:  2100-2300  Protein:  110-125 grams  Fluid:  > 2.1 L/day  EDUCATION NEEDS:   No education needs identified at this time  Arthur Holms, RD, LDN Pager #: [phone] After-Hours Pager #: [phone]

## 2014-11-10 LAB — PROTIME-INR
INR: 1.92 — ABNORMAL HIGH (ref 0.00–1.49)
PROTHROMBIN TIME: 21.8 s — AB (ref 11.6–15.2)

## 2014-11-10 MED ORDER — METOPROLOL TARTRATE 12.5 MG HALF TABLET
37.5000 mg | ORAL_TABLET | Freq: Two times a day (BID) | ORAL | Status: DC
  Administered 2014-11-10 – 2014-11-11 (×3): 37.5 mg via ORAL
  Filled 2014-11-10 (×6): qty 1

## 2014-11-10 MED ORDER — WARFARIN SODIUM 2.5 MG PO TABS
2.5000 mg | ORAL_TABLET | Freq: Every day | ORAL | Status: DC
  Administered 2014-11-10 – 2014-11-11 (×2): 2.5 mg via ORAL
  Filled 2014-11-10 (×2): qty 1

## 2014-11-10 NOTE — Progress Notes (Addendum)
      [address]       [place] [address]             [phone]        9 Days Post-Op Procedure(s) (LRB): MITRAL VALVE REPAIR with no Ring (N/A) CORONARY ARTERY BYPASS GRAFTING (CABG) x 1 using internal mammary artery (N/A) TRANSESOPHAGEAL ECHOCARDIOGRAM (TEE) (N/A) AORTIC VALVE REPLACEMENT (AVR) (N/A) AORTIC ROOT ENLARGEMENT (N/A)  Subjective: He has no specific complaints this am.Patient hopes to be discharged soon.   Objective: Vital signs in last 24 hours: Temp:  [97.5 F (36.4 C)-98.1 F (36.7 C)] 98.1 F (36.7 C) (09/16 0529) Pulse Rate:  [72-94] 94 (09/16 0529) Cardiac Rhythm:  [-] Normal sinus rhythm (09/16 0825) Resp:  [18] 18 (09/16 0529) BP: (108-117)/(68-75) 112/75 mmHg (09/16 0529) SpO2:  [93 %-97 %] 93 % (09/16 0529) Weight:  [184 lb 6.4 oz (83.643 kg)] 184 lb 6.4 oz (83.643 kg) (09/16 0529)  Pre op weight 69 kg Current Weight  11/10/14 184 lb 6.4 oz (83.643 kg)      Intake/Output from previous day: 09/15 0701 - 09/16 0700 In: 1000 [P.O.:1000] Out: -    Physical Exam:  Cardiovascular: IRRR IRRR Pulmonary: Mostly clear Abdomen: Soft, non tender, bowel sounds present. Extremities: Bilateral lower extremity and ankle edema. Wounds: Clean and dry.  No erythema or signs of infection.   Lab Results: CBC:  Recent Labs  11/08/14 0425 11/09/14 0527  WBC 12.8* 12.0*  HGB 9.4* 9.7*  HCT 30.0* 32.3*  PLT 194 215   BMET:   Recent Labs  11/08/14 0425 11/09/14 0527  NA 132* 135  K 3.9 4.1  CL 94* 97*  CO2 29 32  GLUCOSE 106* 103*  BUN 28* 22*  CREATININE 1.23 1.25*  CALCIUM 8.5* 8.5*    PT/INR:  Lab Results  Component Value Date   INR 1.92* 11/10/2014   INR 1.96* 11/09/2014   INR 2.13* 11/08/2014   ABG:  INR: Will add last result for INR, ABG once components are confirmed Will add last 4 CBG results once components are confirmed  Assessment/Plan:  1. CV - A fib with HR in the low 100's this am. His HR  has gotten up to 150 at times. On  Lopressor 25 mg bid, and Coumadin. Will increase Lopressor to 37.5 mg bid for better HR control. Amiodarone was stopped yesterday because of elevated transaminases. If a fib rate stays persistently elevated, may need to consider Cardizem.INR 1.92 this am. Will give 2.5 mg of Coumadin tonight. Echo done yesterday showed LVEF 65-70%, trivial MR, mild TR, trivial pericardial effusion, and a left pleural effusion. 2.  Pulmonary - On room air. Encourage incentive spirometer. Mucinex for cough 3. 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  Acute blood loss anemia - H and H stable at 9.7 and 32.3 5. Volume overload-will give Lasix IV two times today 6. Creatinine is stable at 1.25 7. Deconditioned-continue with CRPI  ZIMMERMAN,DONIELLE MPA-C 11/10/2014,9:44 AM   I have seen and examined the patient and agree with the assessment and plan as outlined.  Making slow but steady progress.  Recheck LFT's tomorrow.  Possible d/c home on 2-3 if he continues to improve.  Rexene Alberts 11/10/2014 6:28 PM

## 2014-11-10 NOTE — Progress Notes (Signed)
CARDIAC REHAB PHASE I   PRE:  Rate/Rhythm: 19 SR c/ frequent PACs  BP:  Sitting: 110/60        SaO2: 91 RA  MODE:  Ambulation: 640 ft   POST:  Rate/Rhythm: 96 SR c/ PACs  BP:  Sitting: 120/76         SaO2: 92 RA  Pt ambulated 640 ft on RA, IV, hand held assist, steady gait, tolerated well. Pt denies CP, dizziness, DOE, declined rest stop. Pt sats 92-95% on RA during ambulation. Pt states he does not feel tired today after walking. Pt to recliner after walk, call bell within reach, feet elevated. Will attempt to return this afternoon to begin discharge education with wife in case pt discharges over the weekend.   [phone]  Lenna Sciara, RN, BSN 11/10/2014 11:37 AM

## 2014-11-11 LAB — COMPREHENSIVE METABOLIC PANEL
ALBUMIN: 2.3 g/dL — AB (ref 3.5–5.0)
ALK PHOS: 77 U/L (ref 38–126)
ALT: 657 U/L — ABNORMAL HIGH (ref 17–63)
ANION GAP: 7 (ref 5–15)
AST: 249 U/L — AB (ref 15–41)
BILIRUBIN TOTAL: 0.7 mg/dL (ref 0.3–1.2)
BUN: 17 mg/dL (ref 6–20)
CALCIUM: 8.5 mg/dL — AB (ref 8.9–10.3)
CO2: 29 mmol/L (ref 22–32)
Chloride: 97 mmol/L — ABNORMAL LOW (ref 101–111)
Creatinine, Ser: 1.11 mg/dL (ref 0.61–1.24)
GFR calc Af Amer: >60 mL/min (ref >60)
GFR calc non Af Amer: >60 mL/min (ref >60)
GLUCOSE: 101 mg/dL — AB (ref 65–99)
Potassium: 4.4 mmol/L (ref 3.5–5.1)
SODIUM: 133 mmol/L — AB (ref 135–145)
Total Protein: 6.2 g/dL — ABNORMAL LOW (ref 6.5–8.1)

## 2014-11-11 LAB — CBC
HEMATOCRIT: 33.9 % — AB (ref 39.0–52.0)
HEMOGLOBIN: 10.3 g/dL — AB (ref 13.0–17.0)
MCH: 27.8 pg (ref 26.0–34.0)
MCHC: 30.4 g/dL (ref 30.0–36.0)
MCV: 91.6 fL (ref 78.0–100.0)
Platelets: 219 10*3/uL (ref 150–400)
RBC: 3.7 MIL/uL — ABNORMAL LOW (ref 4.22–5.81)
RDW: 19.7 % — AB (ref 11.5–15.5)
WBC: 8.2 10*3/uL (ref 4.0–10.5)

## 2014-11-11 LAB — PROTIME-INR
INR: 1.72 — ABNORMAL HIGH (ref 0.00–1.49)
Prothrombin Time: 20.1 seconds — ABNORMAL HIGH (ref 11.6–15.2)

## 2014-11-11 NOTE — Progress Notes (Addendum)
CARDIAC REHAB PHASE I   PRE:  Rate/Rhythm: 19 SR  BP:   Sitting: 126/84    MODE:  Ambulation: 1200 ft   POST:  Rate/Rhythm: 89  BP:   Sitting: 141/81    Pt ambulated 128ft and tolerated walk very well without any chest discomfort, SOB or dizziness. Pt was eager to walk further. Returned pt to recliner with LE elevated and VSS. Educated pt on incision care,sternal precautions, lifting and bending mechanics, lifting restrictions, risk factors, and nutrition. Reviewed exercise guidelines and when to call 911. May need review of education. Pt practiced proper use of ISS. Pt interested in [place] at [facility]. [phone]  Amber D Fair,MS,ACSM-RCEP 11/11/2014 12:25 PM

## 2014-11-11 NOTE — Progress Notes (Addendum)
       [address]       [place] [address]             [phone]          10 Days Post-Op Procedure(s) (LRB): MITRAL VALVE REPAIR with no Ring (N/A) CORONARY ARTERY BYPASS GRAFTING (CABG) x 1 using internal mammary artery (N/A) TRANSESOPHAGEAL ECHOCARDIOGRAM (TEE) (N/A) AORTIC VALVE REPLACEMENT (AVR) (N/A) AORTIC ROOT ENLARGEMENT (N/A)  Subjective: Feels better overall today. Walked earlier this am, no dizziness or SOB.  Appetite still marginal but ate a little better today.  Objective: Vital signs in last 24 hours: Patient Vitals for the past 24 hrs:  BP Temp Temp src Pulse Resp SpO2 Weight  11/11/14 0554 124/86 mmHg 98.1 F (36.7 C) Oral 82 18 94 % -  11/11/14 0340 - - - - - - 183 lb 6.4 oz (83.19 kg)  11/10/14 2145 125/79 mmHg 98 F (36.7 C) Oral 92 18 93 % -  11/10/14 1346 (!) 135/93 mmHg 97.6 F (36.4 C) Oral (!) 117 18 94 % -   Current Weight  11/11/14 183 lb 6.4 oz (83.19 kg)     Intake/Output from previous day: 09/16 0701 - 09/17 0700 In: 600 [P.O.:600] Out: -     PHYSICAL EXAM:  Heart: Irr irr Lungs: Slightly decreased BS in bases Wound: Clean and dry Extremities: Mild LE edema    Lab Results: CBC: Recent Labs  11/09/14 0527 11/11/14 0415  WBC 12.0* 8.2  HGB 9.7* 10.3*  HCT 32.3* 33.9*  PLT 215 219   BMET:  Recent Labs  11/09/14 0527 11/11/14 0415  NA 135 133*  K 4.1 4.4  CL 97* 97*  CO2 32 29  GLUCOSE 103* 101*  BUN 22* 17  CREATININE 1.25* 1.11  CALCIUM 8.5* 8.5*    Lab Results  Component Value Date   ALT 657* 11/11/2014   AST 249* 11/11/2014   ALKPHOS 77 11/11/2014   BILITOT 0.7 11/11/2014    PT/INR:  Recent Labs  11/11/14 0415  LABPROT 20.1*  INR 1.72*      Assessment/Plan: S/P Procedure(s) (LRB): MITRAL VALVE REPAIR with no Ring (N/A) CORONARY ARTERY BYPASS GRAFTING (CABG) x 1 using internal mammary artery (N/A) TRANSESOPHAGEAL ECHOCARDIOGRAM (TEE) (N/A) AORTIC VALVE REPLACEMENT  (AVR) (N/A) AORTIC ROOT ENLARGEMENT (N/A)  CV- AF, rate controlled. Continue increased dose Lopressor, BPs stable and there is room to increase dose further if needed for rate cotrol. Off Amio due to increased LFTs. Continue Coumadin.  ID- Enterococcus endocarditis, continue Amp/Rocephin x 6 weeks from the date of surgery per ID.  Elevated transaminases- LFTs trending down. Amiodarone d/c'ed. Continue to monitor.  Deconditioning- Continue CRPI.  Disp- possibly home in the next few days if he remains stable.   LOS: 24 days    COLLINS,GINA H 11/11/2014  Feels better Home one or two days , follow up lft's I have seen and examined Keyshun A Krone and agree with the above assessment  and plan.  Grace Isaac MD Beeper [phone] Office [phone] 11/11/2014 12:53 PM

## 2014-11-12 DIAGNOSIS — Z48812 Encounter for surgical aftercare following surgery on the circulatory system: Secondary | ICD-10-CM

## 2014-11-12 LAB — COMPREHENSIVE METABOLIC PANEL
ALT: 517 U/L — ABNORMAL HIGH (ref 17–63)
AST: 143 U/L — ABNORMAL HIGH (ref 15–41)
Albumin: 2.3 g/dL — ABNORMAL LOW (ref 3.5–5.0)
Alkaline Phosphatase: 67 U/L (ref 38–126)
Anion gap: 7 (ref 5–15)
BILIRUBIN TOTAL: 0.9 mg/dL (ref 0.3–1.2)
BUN: 17 mg/dL (ref 6–20)
CHLORIDE: 99 mmol/L — AB (ref 101–111)
CO2: 26 mmol/L (ref 22–32)
CREATININE: 1.06 mg/dL (ref 0.61–1.24)
Calcium: 8.6 mg/dL — ABNORMAL LOW (ref 8.9–10.3)
Glucose, Bld: 120 mg/dL — ABNORMAL HIGH (ref 65–99)
POTASSIUM: 4 mmol/L (ref 3.5–5.1)
Sodium: 132 mmol/L — ABNORMAL LOW (ref 135–145)
TOTAL PROTEIN: 5.9 g/dL — AB (ref 6.5–8.1)

## 2014-11-12 LAB — CBC
HEMATOCRIT: 33.2 % — AB (ref 39.0–52.0)
Hemoglobin: 10.5 g/dL — ABNORMAL LOW (ref 13.0–17.0)
MCH: 28.6 pg (ref 26.0–34.0)
MCHC: 31.6 g/dL (ref 30.0–36.0)
MCV: 90.5 fL (ref 78.0–100.0)
PLATELETS: 201 10*3/uL (ref 150–400)
RBC: 3.67 MIL/uL — AB (ref 4.22–5.81)
RDW: 19 % — AB (ref 11.5–15.5)
WBC: 7.3 10*3/uL (ref 4.0–10.5)

## 2014-11-12 LAB — PROTIME-INR
INR: 1.7 — AB (ref 0.00–1.49)
PROTHROMBIN TIME: 20 s — AB (ref 11.6–15.2)

## 2014-11-12 MED ORDER — ASPIRIN 81 MG PO TBEC: 81.0000 mg | DELAYED_RELEASE_TABLET | Freq: Every day | ORAL | Status: DC

## 2014-11-12 MED ORDER — POTASSIUM CHLORIDE CRYS ER 20 MEQ PO TBCR: 20.0000 meq | EXTENDED_RELEASE_TABLET | Freq: Every day | ORAL | Status: DC

## 2014-11-12 MED ORDER — DEXTROSE 5 % IV SOLN: 2.0000 g | Freq: Two times a day (BID) | INTRAVENOUS | Status: DC

## 2014-11-12 MED ORDER — TRAMADOL HCL 50 MG PO TABS: 50.0000 mg | ORAL_TABLET | ORAL | Status: DC | PRN

## 2014-11-12 MED ORDER — WARFARIN SODIUM 2.5 MG PO TABS: 2.5000 mg | ORAL_TABLET | Freq: Every day | ORAL | Status: DC

## 2014-11-12 MED ORDER — SODIUM CHLORIDE 0.9 % IV SOLN: 2.0000 g | INTRAVENOUS | Status: DC

## 2014-11-12 MED ORDER — FUROSEMIDE 40 MG PO TABS: 40.0000 mg | ORAL_TABLET | Freq: Every day | ORAL | Status: DC

## 2014-11-12 MED ORDER — HEPARIN SOD (PORK) LOCK FLUSH 100 UNIT/ML IV SOLN
250.0000 [IU] | INTRAVENOUS | Status: DC | PRN
Start: 2014-11-12 — End: 2014-11-12

## 2014-11-12 MED ORDER — METOPROLOL TARTRATE 50 MG PO TABS
50.0000 mg | ORAL_TABLET | Freq: Two times a day (BID) | ORAL | Status: DC
  Administered 2014-11-12: 50 mg via ORAL
  Filled 2014-11-12: qty 1

## 2014-11-12 MED ORDER — METOPROLOL TARTRATE 50 MG PO TABS: 50.0000 mg | ORAL_TABLET | Freq: Two times a day (BID) | ORAL | Status: DC

## 2014-11-12 NOTE — Progress Notes (Signed)
Discharged to home with family office visits in place teaching done  

## 2014-11-12 NOTE — Progress Notes (Signed)
Patient ambulated 1000 feet with IV pole, on room air. Briefly drop oxygen to 87 in the first 200 ft, saturation level maintained 92 to 98 thereafter. No rest stops, sob or complaints of pain. Maximum heart rate 112. Patient return to room and bed with call bell in reach.

## 2014-11-12 NOTE — Progress Notes (Addendum)
       [address]       [place] [address]             [phone]          11 Days Post-Op Procedure(s) (LRB): MITRAL VALVE REPAIR with no Ring (N/A) CORONARY ARTERY BYPASS GRAFTING (CABG) x 1 using internal mammary artery (N/A) TRANSESOPHAGEAL ECHOCARDIOGRAM (TEE) (N/A) AORTIC VALVE REPLACEMENT (AVR) (N/A) AORTIC ROOT ENLARGEMENT (N/A)  Subjective: Feels well, no complaints. Walked earlier this am and HR got up in the 120s. Pt asymptomatic. Wants to go home.    Objective: Vital signs in last 24 hours: Patient Vitals for the past 24 hrs:  BP Temp Temp src Pulse Resp SpO2 Weight  11/12/14 0448 106/70 mmHg 98 F (36.7 C) Oral 80 16 95 % 178 lb (80.74 kg)  11/11/14 2024 (!) 122/56 mmHg 98.4 F (36.9 C) Oral 90 18 96 % -  11/11/14 1403 112/72 mmHg 98.2 F (36.8 C) Oral 80 18 96 % -   Current Weight  11/12/14 178 lb (80.74 kg)     Intake/Output from previous day: 09/17 0701 - 09/18 0700 In: 960 [P.O.:960] Out: 150 [Urine:150]    PHYSICAL EXAM:  Heart: Irr irr Lungs: Clear Wound: Clean and dry Extremities: Mild LE edema    Lab Results: CBC: Recent Labs  11/11/14 0415 11/12/14 0420  WBC 8.2 7.3  HGB 10.3* 10.5*  HCT 33.9* 33.2*  PLT 219 201   BMET:  Recent Labs  11/11/14 0415 11/12/14 0420  NA 133* 132*  K 4.4 4.0  CL 97* 99*  CO2 29 26  GLUCOSE 101* 120*  BUN 17 17  CREATININE 1.11 1.06  CALCIUM 8.5* 8.6*    PT/INR:  Recent Labs  11/12/14 0420  LABPROT 20.0*  INR 1.70*   Lab Results  Component Value Date   ALT 517* 11/12/2014   AST 143* 11/12/2014   ALKPHOS 67 11/12/2014   BILITOT 0.9 11/12/2014      Assessment/Plan: S/P Procedure(s) (LRB): MITRAL VALVE REPAIR with no Ring (N/A) CORONARY ARTERY BYPASS GRAFTING (CABG) x 1 using internal mammary artery (N/A) TRANSESOPHAGEAL ECHOCARDIOGRAM (TEE) (N/A) AORTIC VALVE REPLACEMENT (AVR) (N/A) AORTIC ROOT ENLARGEMENT (N/A)  CV- AF, rate controlled. HR a  little more elevated this am with exertion. Will increase Lopressor further. BPs stable. Off Amio due to increased LFTs. Continue Coumadin.  ID- Enterococcus endocarditis, continue Amp/Rocephin x 6 weeks from the date of surgery per ID.  Elevated transaminases- LFTs trending down. Amiodarone d/c'ed. Continue to monitor.  Deconditioning- Continue CRPI.  Disp- discussed with MD, will plan d/c home today.   LOS: 25 days    COLLINS,GINA H 11/12/2014  Plan d/c today, overall feels well and wants to go home Home nurse to start tonight dosing iv antibiotics I have seen and examined [place] and agree with the above assessment  and plan.  Grace Isaac MD Beeper [phone] Office [phone] 11/12/2014 11:59 AM

## 2014-11-12 NOTE — Care Management Note (Signed)
Case Management Note  Patient Details  Name: Alan [name] MRN: [id number] Date of Birth: 1950-05-09  Subjective/Objective:                   enterococcal bacteremia Action/Plan:  DISCHARGE PLANNING Expected Discharge Date:  11/12/14               Expected Discharge Plan:  [place]  In-House Referral:     Discharge planning Services  CM Consult  Post Acute Care Choice:  Home Health Choice offered to:  Patient, Spouse  DME Arranged:  IV pump/equipment, 3-N-1, Walker rolling DME Agency:  [place]:  RN, IV Antibiotics HH Agency:  [place]  Status of Service:  Completed, signed off  Medicare Important Message Given:    Date Medicare IM Given:    Medicare IM give by:    Date Additional Medicare IM Given:    Additional Medicare Important Message give by:     If discussed at [place] of Stay Meetings, dates discussed:    Additional Comments: CM met with pt in room to discuss discharge.  Pt will have last run of IV ABX at 16:30.  CM called PA and confirmed duration of IV ABX to be 6 weeks from surgery (31 more days). CM faxed prescription to [facility] pharmacy and made RN, Jeneen Rinks aware of the 16:30 run and SOC this evening. Cm called [place] DME rep, Merry Proud to please deliver the rolling walker and 3n1 to room prior to discharge.  No other CM needs were communicated. Dellie Catholic, RN 11/12/2014, 11:30 AM

## 2014-11-17 ENCOUNTER — Telehealth: Payer: Self-pay | Admitting: Cardiovascular Disease

## 2014-11-17 ENCOUNTER — Ambulatory Visit (INDEPENDENT_AMBULATORY_CARE_PROVIDER_SITE_OTHER): Payer: Self-pay | Admitting: Pharmacist Clinician (PhC)/ Clinical Pharmacy Specialist

## 2014-11-17 DIAGNOSIS — Z953 Presence of xenogenic heart valve: Secondary | ICD-10-CM

## 2014-11-17 DIAGNOSIS — Z7901 Long term (current) use of anticoagulants: Secondary | ICD-10-CM

## 2014-11-17 DIAGNOSIS — Z954 Presence of other heart-valve replacement: Secondary | ICD-10-CM

## 2014-11-17 DIAGNOSIS — Z9889 Other specified postprocedural states: Secondary | ICD-10-CM

## 2014-11-17 DIAGNOSIS — I480 Paroxysmal atrial fibrillation: Secondary | ICD-10-CM

## 2014-11-17 LAB — POCT INR: INR: 1.9

## 2014-11-17 NOTE — Telephone Encounter (Signed)
Margarita Grizzle called in wanted to inform Dr.C that the pt's heart rate has been elevated and the pt's wife had been giving the pt his Metoprolol once a day instead of BID which caused his slight chest pain. Margarita Grizzle informed the wife on what the correct dosage was and will be moving forward with the BID. Please f/u if you need to   Thanks

## 2014-11-17 NOTE — Telephone Encounter (Signed)
Pt HR of 103-104 w/ symptom of palpitations. Pt was not taking metoprolol correctly (had been taking daily instead of BID).  HHRN gave clarification for medication dosage and frequency today.  Has f/u w/ Tarri Fuller on 10/5 following his MVR by Dr. Roxy Manns. Sees Dr. Roxy Manns on 10/17.  Pt/family advised to call if new problems.   Routing to Dr. Sallyanne Kuster, Tarri Fuller for any additional considerations.

## 2014-11-17 NOTE — Telephone Encounter (Signed)
It would be great if we could get an ecg to make sure this is sinus tachycardia and not atrial fibrillation. Not sure if there is still time today.

## 2014-11-17 NOTE — Telephone Encounter (Signed)
Left message for patient to call. Can set up for ECG Monday if he can't get in today (pt lives about 40 mins away).

## 2014-11-21 NOTE — Telephone Encounter (Signed)
LMTCB

## 2014-11-23 ENCOUNTER — Telehealth: Payer: Self-pay | Admitting: Cardiovascular Disease

## 2014-11-23 ENCOUNTER — Ambulatory Visit (INDEPENDENT_AMBULATORY_CARE_PROVIDER_SITE_OTHER): Payer: BC Managed Care – PPO | Admitting: Pharmacist Clinician (PhC)/ Clinical Pharmacy Specialist

## 2014-11-23 DIAGNOSIS — Z7901 Long term (current) use of anticoagulants: Secondary | ICD-10-CM

## 2014-11-23 DIAGNOSIS — I480 Paroxysmal atrial fibrillation: Secondary | ICD-10-CM

## 2014-11-23 DIAGNOSIS — Z954 Presence of other heart-valve replacement: Secondary | ICD-10-CM

## 2014-11-23 DIAGNOSIS — Z953 Presence of xenogenic heart valve: Secondary | ICD-10-CM

## 2014-11-23 DIAGNOSIS — Z9889 Other specified postprocedural states: Secondary | ICD-10-CM

## 2014-11-23 LAB — POCT INR: INR: 3

## 2014-11-23 MED ORDER — METOPROLOL TARTRATE 50 MG PO TABS
75.0000 mg | ORAL_TABLET | Freq: Two times a day (BID) | ORAL | Status: DC
Start: 2014-11-23 — End: 2014-12-18

## 2014-11-23 NOTE — Telephone Encounter (Signed)
Spoke to patient information given to increase METOPROLOL  75 MG TWICE ADAY PATIENT VERBALIZED UNDERSTANDING. (TAKE 1 AND 1/2 TABLETS OF A 50 MG TABLET TWICE A DAY .) PATIENT WILL GET ANEW PRESCRIPTION AT UPCOMING APPOINTMENT 11/29/14

## 2014-11-23 NOTE — Telephone Encounter (Signed)
Nurse is at patient's home. She reports a heart rate of 125-135 beats a min. Irregular.  B/P 122/70-- per nurse no other symptoms  patient had not taken Metoprolol dose for the morning. Informed nurse , -have patient to take metoprolol tartate 50 mg now.  Patient has an appointment 11/29/14 with Tarri Fuller PA Will defer to Dr Lynford Humphrey contact patient,home health nurse [phone] voiced understanding.

## 2014-11-23 NOTE — Telephone Encounter (Addendum)
RN spoke to Dr Sallyanne Kuster - patient is on warfarin Per order,increase metoprolol  tartrate 75 mg twice a day Informed Shelly- home health nurse. She states she has noticed- little forgetfulness  SHE VERBALIZED UNDERSTANDING.

## 2014-11-25 ENCOUNTER — Encounter (HOSPITAL_COMMUNITY): Payer: Self-pay | Admitting: Emergency Medicine

## 2014-11-25 ENCOUNTER — Emergency Department (HOSPITAL_COMMUNITY)
Admission: EM | Admit: 2014-11-25 | Discharge: 2014-11-25 | Discharge status: Against Medical Advice | Payer: BC Managed Care – PPO | Attending: Emergency Medicine | Admitting: Emergency Medicine

## 2014-11-25 ENCOUNTER — Emergency Department (HOSPITAL_COMMUNITY): Payer: BC Managed Care – PPO

## 2014-11-25 DIAGNOSIS — R079 Chest pain, unspecified: Secondary | ICD-10-CM | POA: Diagnosis not present

## 2014-11-25 DIAGNOSIS — Z8639 Personal history of other endocrine, nutritional and metabolic disease: Secondary | ICD-10-CM | POA: Diagnosis not present

## 2014-11-25 DIAGNOSIS — Z720 Tobacco use: Secondary | ICD-10-CM | POA: Diagnosis not present

## 2014-11-25 DIAGNOSIS — Z8719 Personal history of other diseases of the digestive system: Secondary | ICD-10-CM | POA: Diagnosis not present

## 2014-11-25 DIAGNOSIS — Z7901 Long term (current) use of anticoagulants: Secondary | ICD-10-CM | POA: Insufficient documentation

## 2014-11-25 DIAGNOSIS — Z452 Encounter for adjustment and management of vascular access device: Secondary | ICD-10-CM | POA: Insufficient documentation

## 2014-11-25 DIAGNOSIS — Z8042 Family history of malignant neoplasm of prostate: Secondary | ICD-10-CM | POA: Diagnosis not present

## 2014-11-25 DIAGNOSIS — R7989 Other specified abnormal findings of blood chemistry: Secondary | ICD-10-CM | POA: Diagnosis not present

## 2014-11-25 DIAGNOSIS — Z8673 Personal history of transient ischemic attack (TIA), and cerebral infarction without residual deficits: Secondary | ICD-10-CM | POA: Diagnosis not present

## 2014-11-25 DIAGNOSIS — G2581 Restless legs syndrome: Secondary | ICD-10-CM | POA: Insufficient documentation

## 2014-11-25 DIAGNOSIS — Z8619 Personal history of other infectious and parasitic diseases: Secondary | ICD-10-CM | POA: Diagnosis not present

## 2014-11-25 DIAGNOSIS — Z9889 Other specified postprocedural states: Secondary | ICD-10-CM | POA: Insufficient documentation

## 2014-11-25 DIAGNOSIS — I480 Paroxysmal atrial fibrillation: Secondary | ICD-10-CM | POA: Diagnosis not present

## 2014-11-25 DIAGNOSIS — R778 Other specified abnormalities of plasma proteins: Secondary | ICD-10-CM

## 2014-11-25 DIAGNOSIS — I4892 Unspecified atrial flutter: Secondary | ICD-10-CM | POA: Diagnosis not present

## 2014-11-25 DIAGNOSIS — Z7982 Long term (current) use of aspirin: Secondary | ICD-10-CM | POA: Diagnosis not present

## 2014-11-25 DIAGNOSIS — I2510 Atherosclerotic heart disease of native coronary artery without angina pectoris: Secondary | ICD-10-CM | POA: Diagnosis not present

## 2014-11-25 LAB — CBC
HEMATOCRIT: 38.5 % — AB (ref 39.0–52.0)
Hemoglobin: 12 g/dL — ABNORMAL LOW (ref 13.0–17.0)
MCH: 27.2 pg (ref 26.0–34.0)
MCHC: 31.2 g/dL (ref 30.0–36.0)
MCV: 87.3 fL (ref 78.0–100.0)
PLATELETS: 302 10*3/uL (ref 150–400)
RBC: 4.41 MIL/uL (ref 4.22–5.81)
RDW: 16.8 % — ABNORMAL HIGH (ref 11.5–15.5)
WBC: 5.9 10*3/uL (ref 4.0–10.5)

## 2014-11-25 LAB — I-STAT TROPONIN, ED: TROPONIN I, POC: 0.23 ng/mL — AB (ref 0.00–0.08)

## 2014-11-25 LAB — I-STAT CHEM 8, ED
BUN: 22 mg/dL — AB (ref 6–20)
CALCIUM ION: 1.26 mmol/L (ref 1.13–1.30)
CHLORIDE: 106 mmol/L (ref 101–111)
CREATININE: 1.3 mg/dL — AB (ref 0.61–1.24)
GLUCOSE: 93 mg/dL (ref 65–99)
HCT: 43 % (ref 39.0–52.0)
Hemoglobin: 14.6 g/dL (ref 13.0–17.0)
POTASSIUM: 4.5 mmol/L (ref 3.5–5.1)
Sodium: 139 mmol/L (ref 135–145)
TCO2: 23 mmol/L (ref 0–100)

## 2014-11-25 LAB — D-DIMER, QUANTITATIVE (NOT AT ARMC): D DIMER QUANT: 5.98 ug{FEU}/mL — AB (ref 0.00–0.48)

## 2014-11-25 LAB — BASIC METABOLIC PANEL
Anion gap: 10 (ref 5–15)
BUN: 16 mg/dL (ref 6–20)
CHLORIDE: 104 mmol/L (ref 101–111)
CO2: 22 mmol/L (ref 22–32)
CREATININE: 1.33 mg/dL — AB (ref 0.61–1.24)
Calcium: 9.9 mg/dL (ref 8.9–10.3)
GFR calc non Af Amer: 54 mL/min — ABNORMAL LOW (ref >60)
Glucose, Bld: 94 mg/dL (ref 65–99)
POTASSIUM: 4.3 mmol/L (ref 3.5–5.1)
SODIUM: 136 mmol/L (ref 135–145)

## 2014-11-25 LAB — TROPONIN I: TROPONIN I: 0.05 ng/mL — AB (ref <0.031)

## 2014-11-25 LAB — PROTIME-INR
INR: 2.88 — ABNORMAL HIGH (ref 0.00–1.49)
PROTHROMBIN TIME: 29.7 s — AB (ref 11.6–15.2)

## 2014-11-25 LAB — MAGNESIUM: Magnesium: 1.9 mg/dL (ref 1.7–2.4)

## 2014-11-25 MED ORDER — MAGNESIUM SULFATE IN D5W 10-5 MG/ML-% IV SOLN: 1.0000 g | Freq: Once | INTRAVENOUS | Status: DC

## 2014-11-25 MED ORDER — SODIUM CHLORIDE 0.9 % IV BOLUS (SEPSIS): 500.0000 mL | Freq: Once | INTRAVENOUS | Status: DC

## 2014-11-25 MED ORDER — SODIUM CHLORIDE 0.9 % IV BOLUS (SEPSIS)
1000.0000 mL | Freq: Once | INTRAVENOUS | Status: AC
  Administered 2014-11-25: 1000 mL via INTRAVENOUS

## 2014-11-25 MED ORDER — ASPIRIN 81 MG PO CHEW
324.0000 mg | CHEWABLE_TABLET | Freq: Once | ORAL | Status: AC
  Administered 2014-11-25: 324 mg via ORAL
  Filled 2014-11-25: qty 4

## 2014-11-25 MED ORDER — IBUTILIDE FUMARATE 1 MG/10ML IV SOLN: 1.0000 mg | Freq: Once | INTRAVENOUS | Status: DC

## 2014-11-25 NOTE — ED Notes (Signed)
Pt family member requesting food for pt at this time, per Dr. Regenia Skeeter pt is now unsure if he will stay and would like to speak with cardiology one more time.   Cardiology called and pt and family updated.

## 2014-11-25 NOTE — ED Notes (Addendum)
Pt here with PICC line in right arm. Pt reports that he was out in the yard today when a thorn pierced the picc line tubing. Pt reports that his infusion was leaking from the tube. During triage pt reports chest pain onset while working in yard today.

## 2014-11-25 NOTE — ED Provider Notes (Signed)
CSN: [id number]     Arrival date & time 11/25/14  1534 History   First MD Initiated Contact with Patient 11/25/14 1600     Chief Complaint  Patient presents with  . Vascular Access Problem     (Consider location/radiation/quality/duration/timing/severity/associated sxs/prior Treatment) HPI  64 year old male presents with a PICC line problem. Patient states he has had no issues with his PICC line since discharge from the hospital a couple weeks ago. He is on Rocephin and ampicillin for enterococcus endocarditis. He was working in the ER today and since coming back inside he noticed that one of the ports on the PICC line seems to be leaking and thinks he sees a hole. 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 He had his Rocephin today was ampicillin is a 24-hour medicine and thus this is been positive. Of note the patient does endorse intermittent chest pain since he has been back in the yard. He thinks he is pushing himself too hard and while he is working he notices fleeting chest pain. He is unable to describe exactly what the pain feels like but states that it lasts only a few seconds. Most recently occurred about 2 hours ago. No current chest pain. No associated shortness of breath, nausea, vomiting, or diaphoresis.  Past Medical History  Diagnosis Date  . Restless leg syndrome   . Prostate cancer ([place])   . 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 Paroxysmal atrial fibrillation ([place]) 10/19/2014  . Paroxysmal atrial flutter ([place]) 10/19/2014  . Enterococcal bacteremia 10/19/2014  . 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 Protein-calorie malnutrition, severe ([place])   . 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 Coronary artery disease  involving native coronary artery without angina pectoris 10/23/2014  . 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 Hepatitis   . 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/2014    23 mm [facility] Ease bovine pericardial bioprosthetic tissue valve with bovine pericardial patch enlargement of aortic root  . 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 S/P mitral valve repair 11/01/2014    Debridement of vegetation on anterior leaflet   Past Surgical History  Procedure Laterality Date  . Prostatectomy    . Cardiac catheterization N/A 10/23/2014    Procedure: Right/Left Heart Cath and Coronary Angiography;  Surgeon: Lorretta Harp, MD;  Location: [facility] CV LAB;  Service: Cardiovascular;  Laterality: N/A;  . Tee without cardioversion N/A 10/20/2014    Procedure: TRANSESOPHAGEAL ECHOCARDIOGRAM (TEE);  Surgeon: Josue Hector, MD;  Location: [facility] ENDOSCOPY;  Service: Cardiovascular;  Laterality: N/A;  . Colonoscopy N/A 10/25/2014    Procedure: COLONOSCOPY;  Surgeon: Arta Silence, MD;  Location: [facility] ENDOSCOPY;  Service: Endoscopy;  Laterality: N/A;  . Mitral valve repair N/A 11/01/2014    Procedure: MITRAL VALVE REPAIR with no Ring;  Surgeon: Rexene Alberts, MD;  Location: [place];  Service: Open Heart Surgery;  Laterality: N/A;  . Coronary artery bypass graft N/A 11/01/2014    Procedure: CORONARY ARTERY BYPASS GRAFTING (CABG) x 1 using internal mammary artery;  Surgeon: Rexene Alberts, MD;  Location: [place];  Service: Open Heart Surgery;  Laterality: N/A;  . Tee without cardioversion N/A 11/01/2014    Procedure: TRANSESOPHAGEAL ECHOCARDIOGRAM (TEE);  Surgeon: Rexene Alberts, MD;  Location: [place];  Service: Open Heart Surgery;  Laterality: N/A;  . Aortic valve replacement N/A 11/01/2014    Procedure: AORTIC VALVE REPLACEMENT (AVR);  Surgeon: Rexene Alberts, MD;  Location: [place];  Service: Open Heart Surgery;  Laterality:  N/A;  . Aortic root enlargement N/A 11/01/2014    Procedure: AORTIC ROOT ENLARGEMENT;  Surgeon: Rexene Alberts, MD;  Location: [place];  Service: Open Heart Surgery;  Laterality: N/A;   Family History  Problem Relation Age of Onset  . CAD Neg Hx    Social History  Substance Use Topics  . Smoking status: Former Research scientist (life sciences)  . Smokeless tobacco: None     Comment: Smoked a pipe age 85->28  . Alcohol Use: No     Comment: Drank from age 89->38    Review of Systems  Constitutional: Negative for fever.  Respiratory: Negative for shortness of breath.   Cardiovascular: Positive for chest pain. Negative for leg swelling.  Gastrointestinal: Negative for vomiting.  Skin: Negative for color change and wound.  All other systems reviewed and are negative.     Allergies  Review of patient's allergies indicates no known allergies.  Home Medications   Prior to Admission medications   Medication Sig Start Date End Date Taking? Authorizing Provider  ampicillin 2 g in sodium chloride 0.9 % 50 mL Inject 2 g into the vein every 4 (four) hours. 11/12/14   Coolidge Breeze, PA-C  aspirin EC 81 MG EC tablet Take 1 tablet (81 mg total) by mouth daily. 11/12/14   Coolidge Breeze, PA-C  cefTRIAXone 2 g in dextrose 5 % 50 mL Inject 2 g into the vein every 12 (twelve) hours. 11/12/14   Coolidge Breeze, PA-C  furosemide (LASIX) 40 MG tablet Take 1 tablet (40 mg total) by mouth daily. 11/12/14   Coolidge Breeze, PA-C  metoprolol (LOPRESSOR) 50 MG tablet Take 1.5 tablets (75 mg total) by mouth 2 (two) times daily. 11/23/14   Mihai Croitoru, MD  potassium chloride SA (K-DUR,KLOR-CON) 20 MEQ tablet Take 1 tablet (20 mEq total) by mouth daily. 11/12/14   Coolidge Breeze, PA-C  pramipexole (MIRAPEX) 1.5 MG tablet Take 1.5 mg by mouth at bedtime.    Historical Provider, MD  traMADol (ULTRAM) 50 MG tablet Take 1-2 tablets (50-100 mg total) by mouth every 4 (four) hours as needed for moderate pain. 11/12/14   Coolidge Breeze, PA-C   warfarin (COUMADIN) 2.5 MG tablet Take 1 tablet (2.5 mg total) by mouth daily. Or as directed by the [facility]mad[facility] Clinic 11/12/14   Coolidge Breeze, PA-C   BP 91/59 mmHg  Pulse 119  Temp(Src) 98.5 F (36.9 C) (Oral)  Resp 20  Ht 6' (1.829 m)  Wt 155 lb (70.308 kg)  BMI 21.02 kg/m2  SpO2 97% Physical Exam  Constitutional: He is oriented to person, place, and time. He appears well-developed and well-nourished.  HENT:  Head: Normocephalic and atraumatic.  Right Ear: External ear normal.  Left Ear: External ear normal.  Nose: Nose normal.  Eyes: Right eye exhibits no discharge. Left eye exhibits no discharge.  Neck: Neck supple.  Cardiovascular: Regular rhythm, normal heart sounds and intact distal pulses.  Tachycardia present.   Pulmonary/Chest: Effort normal and breath sounds normal. He has no wheezes. He has no rales.  Abdominal: Soft. He exhibits no distension.  There is no tenderness.  Musculoskeletal: He exhibits no edema.  Right PICC line appears normal with no obvious breaks. No cellulitis, tenderness or swelling to the site  Neurological: He is alert and oriented to person, place, and time.  Skin: Skin is warm and dry.  Nursing note and vitals reviewed.   ED Course  Procedures (including critical care time) Labs Review Labs Reviewed  BASIC METABOLIC PANEL - Abnormal; Notable for the following:    Creatinine, Ser 1.33 (*)    GFR calc non Af Amer 54 (*)    All other components within normal limits  CBC - Abnormal; Notable for the following:    Hemoglobin 12.0 (*)    HCT 38.5 (*)    RDW 16.8 (*)    All other components within normal limits  PROTIME-INR - Abnormal; Notable for the following:    Prothrombin Time 29.7 (*)    INR 2.88 (*)    All other components within normal limits  D-DIMER, QUANTITATIVE (NOT AT [facility]) - Abnormal; Notable for the following:    D-Dimer, Quant 5.98 (*)    All other components within normal limits  TROPONIN I - Abnormal; Notable for the  following:    Troponin I 0.05 (*)    All other components within normal limits  I-STAT CHEM 8, ED - Abnormal; Notable for the following:    BUN 22 (*)    Creatinine, Ser 1.30 (*)    All other components within normal limits  I-STAT TROPOININ, ED - Abnormal; Notable for the following:    Troponin i, poc 0.23 (*)    All other components within normal limits  MAGNESIUM    Imaging Review Dg Chest Port 1 View  11/25/2014   CLINICAL DATA:  Chest pain for 2 days.  Ex-smoker.  EXAM: PORTABLE CHEST 1 VIEW  COMPARISON:  11/09/2014  FINDINGS: Right-sided PICC line terminates at the low SVC. Prior median sternotomy. Aortic valve repair, mitral valve repair with left atrial appendage occlusion device. Suspect trace left pleural thickening or fluid. No right-sided pleural effusion or pneumothorax. Improved bibasilar aeration with resolution of right base airspace disease. Mild left base opacity remains. No congestive failure.  IMPRESSION: Trace left-sided pleural fluid or thickening with adjacent patchy airspace disease. Favor atelectasis. Consider radiographic follow-up in 2-3 days to exclude pneumonia.  Extensive postsurgical changes, without congestive heart failure.   Electronically Signed   By: Abigail Miyamoto M.D.   On: 11/25/2014 16:31   I have personally reviewed and evaluated these images and lab results as part of my medical decision-making.   EKG Interpretation   Date/Time:  Saturday November 25 2014 15:55:31 EDT Ventricular Rate:  122 PR Interval:  226 QRS Duration: 94 QT Interval:  412 QTC Calculation: 587 R Axis:   92 Text Interpretation:  ** Critical Test Result: Long QTc ** Suspect arm  lead reversal, interpretation assumes no reversal Sinus tachycardia with  1st degree A-V block Rightward axis Left ventricular hypertrophy with  repolarization abnormality Anteroseptal infarct , age undetermined  Abnormal ECG New inferolateral ischemia since last EKG QT prolongation new  Confirmed by  LIU MD, Hinton Dyer ([id number]) on 11/25/2014 3:59:28 PM      MDM   Final diagnoses:  PICC (peripherally inserted central catheter) in place  Atrial flutter, unspecified type (HCC)  Elevated troponin    Patient appears to have new EKG changes although some of this is due to new atrial flutter. However his ST segments appear concerning though no ST elevation.  Initial troponin mildly elevated, difficult to interpret in the setting of recent coronary surgery. His chest pain is quite atypical and not present now and has been not present for several hours. Discussed with cardiology who initially recommended chemical cardioversion but after further discussion with patient they would like to just discharged home as he has had this before. His blood pressures are soft but he wants to go home. I did discuss the possibly of a blood clot although his INR is therapeutic. Cardial he thinks is very unlikely and that his d-dimer is more likely related to the recent surgery and a flutter. Patient does not want CT scan. His PICC line, which was his actual chief complaint, works normally and does not leak. Might of been a Issue but this point it is functioning normally. Patient wants to leave and does not want any further treatments that he understands that if anything else were to be wrong such as an MI things could get worse including for disability or death. He will leave, if any symptoms worsen he will return or see his cardiologist.    Sherwood Gambler, MD 11/25/14 [id number]

## 2014-11-25 NOTE — Discharge Instructions (Signed)
You have decided to leave [place]. We are worried about heart damage from either a heart attack, too fast of a heart rate or blood clot in your lungs. There are also other possibilities. You have decided to leave and forgo further testing, treatment or hospitalization. Please follow up with your cardiologist and CT surgeon as soon as possible and return here at any time for evaluation.

## 2014-11-25 NOTE — ED Notes (Signed)
Pt requesting to leave at this time, refusing any further treatment or imaging. Dr. Regenia Skeeter made aware and cardiology both at bedside to speak with pt.    Per Dr. Regenia Skeeter, after conversation pt requesting to still leave AMA at this time.

## 2014-11-25 NOTE — ED Notes (Signed)
Iv team at bedside to assess PICC line.

## 2014-11-29 ENCOUNTER — Encounter: Payer: Self-pay | Admitting: Physician Assistant

## 2014-11-29 ENCOUNTER — Ambulatory Visit (INDEPENDENT_AMBULATORY_CARE_PROVIDER_SITE_OTHER): Payer: BC Managed Care – PPO | Admitting: Physician Assistant

## 2014-11-29 VITALS — BP 100/70 | HR 139 | Ht 72.0 in | Wt 156.4 lb

## 2014-11-29 DIAGNOSIS — Z951 Presence of aortocoronary bypass graft: Secondary | ICD-10-CM

## 2014-11-29 DIAGNOSIS — I351 Nonrheumatic aortic (valve) insufficiency: Secondary | ICD-10-CM | POA: Diagnosis not present

## 2014-11-29 DIAGNOSIS — Z7901 Long term (current) use of anticoagulants: Secondary | ICD-10-CM

## 2014-11-29 DIAGNOSIS — R778 Other specified abnormalities of plasma proteins: Secondary | ICD-10-CM

## 2014-11-29 DIAGNOSIS — Z954 Presence of other heart-valve replacement: Secondary | ICD-10-CM

## 2014-11-29 DIAGNOSIS — I4892 Unspecified atrial flutter: Secondary | ICD-10-CM | POA: Diagnosis not present

## 2014-11-29 DIAGNOSIS — R7989 Other specified abnormal findings of blood chemistry: Secondary | ICD-10-CM

## 2014-11-29 DIAGNOSIS — Z9889 Other specified postprocedural states: Secondary | ICD-10-CM | POA: Diagnosis not present

## 2014-11-29 DIAGNOSIS — Z953 Presence of xenogenic heart valve: Secondary | ICD-10-CM

## 2014-11-29 MED ORDER — AMIODARONE HCL 200 MG PO TABS: ORAL_TABLET | ORAL | Status: DC

## 2014-11-29 NOTE — Patient Instructions (Signed)
Start Amiodarone 400 mg twice a day   Schedule nurse appointment Friday 12/01/14 for EKG show Tarri Fuller PA EKG   Your physician recommends that you schedule a follow-up appointment in: 1 month with Dr.Croitoru.

## 2014-11-29 NOTE — Progress Notes (Signed)
Patient ID: Alan [name], male   DOB: [birth year], 64 y.o.   MRN: [id number]    Date:  11/29/2014   ID:  Alan [name], DOB 26-Apr-1950, MRN [id number]  PCP:  [name], MD  Primary Cardiologist:  Croitoru  Chief Complaint  Patient presents with  . Follow-up    a couple episodes of discomfort around his incision     History of Present Illness: Alan [name] is a 64 y.o. male who recently had endocarditis and ultimately had mitral valve repair and aortic valve replacement along with coronary artery bypass past grafting 1 with LIMA to LAD.  His aortic valve is a 23 mm [facility] Ease bovine pericardial bioprosthetic tissue valve with bovine pericardial patch enlargement of aortic root.  Surgery was completed on 11/01/2014. His postop course was complicated by atrial fibrillation for which he was started on IV amiodarone and later switched to by mouth, then discontinued.  Cardiac catheterization was completed on 10/23/2014 and revealed Ost LAD lesion, 60% stenosed. Prox LAD to Mid LAD lesion, 70% stenosed. Ost LPDA to LPDA lesion, 90% stenosed. Ost 2nd Mrg to 2nd Mrg lesion, 70% stenosed. 2nd Mrg lesion, 50% stenosed.  Patient is here for posthospital follow-up he reports feeling a little dizzy every now and then and having what he describes as a "catch" on the left side of his chest around the nipple area. He describes the intensity as 2 out of 10. Also had some chest soreness around the incision area.  The pain does not appear to be related to exertion.  EKG reveals atrial flutter with a rate of 139. Seems to be relatively asymptomatic. He was seen in the emergency room on October 1 his problems with his PICC line.  He is in atrial flutter at that time rate of 116 bpm. Troponin 0.23  The patient currently denies nausea, vomiting, fever, chest pain, shortness of breath, orthopnea, dizziness, PND, cough, congestion, abdominal pain, hematochezia, melena, lower extremity  edema, claudication.  Wt Readings from Last 3 Encounters:  11/29/14 70.943 kg (156 lb 6.4 oz)  11/25/14 70.308 kg (155 lb)  11/12/14 80.74 kg (178 lb)     Past Medical History  Diagnosis Date  . Restless leg syndrome   . Prostate cancer ([place])   . 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 Paroxysmal atrial fibrillation ([place]) 10/19/2014  . Paroxysmal atrial flutter ([place]) 10/19/2014  . Enterococcal bacteremia 10/19/2014  . 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 Protein-calorie malnutrition, severe ([place])   . 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 Coronary artery disease involving native coronary artery without angina pectoris 10/23/2014  . 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 Hepatitis   . 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/2014    23 mm [facility] Ease bovine pericardial bioprosthetic tissue valve with bovine pericardial patch enlargement of aortic root  . 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 S/P mitral valve repair 11/01/2014    Debridement of vegetation on anterior leaflet    Current Outpatient Prescriptions  Medication Sig Dispense Refill  . ampicillin 2 g in sodium chloride 0.9 % 50 mL Inject 2 g into the vein every 4 (four) hours.    Marland Kitchen  aspirin EC 81 MG EC tablet Take 1 tablet (81 mg total) by mouth daily.    . cefTRIAXone 2 g in dextrose 5 % 50 mL Inject 2 g into the vein every 12 (twelve) hours.    . furosemide (LASIX) 40 MG tablet Take 1 tablet (40 mg total) by mouth daily. 30 tablet 0  . metoprolol (LOPRESSOR) 50 MG tablet Take 1.5 tablets (75 mg total) by mouth 2 (two) times daily. 90 tablet 6  . potassium chloride SA (K-DUR,KLOR-CON) 20 MEQ tablet Take 1 tablet (20 mEq  total) by mouth daily. 30 tablet 1  . pramipexole (MIRAPEX) 1.5 MG tablet Take 1.5 mg by mouth at bedtime.    . traMADol (ULTRAM) 50 MG tablet Take 1-2 tablets (50-100 mg total) by mouth every 4 (four) hours as needed for moderate pain. 30 tablet 0  . warfarin (COUMADIN) 2.5 MG tablet Take 1 tablet (2.5 mg total) by mouth daily. Or as directed by the [facility] (Patient taking differently: Take 2.5 mg by mouth daily with supper. Or as directed by the [facility]madin Clinic) 60 tablet 1  . amiodarone (PACERONE) 200 MG tablet Take 2 tablets ( 400 ) mg twice a day 112 tablet 3   No current facility-administered medications for this visit.    Allergies:    Allergies  Allergen Reactions  . Pollen Extract Other (See Comments)    sneezing    Social History:  The patient  reports that he has quit smoking. He does not have any smokeless tobacco history on file. He reports that he does not drink alcohol or use illicit drugs.   Family history:   Family History  Problem Relation Age of Onset  . CAD Neg Hx     ROS:  Please see the history of present illness.  All other systems reviewed and negative.   PHYSICAL EXAM: VS:  BP 100/70 mmHg  Pulse 139  Ht 6' (1.829 m)  Wt 70.943 kg (156 lb 6.4 oz)  BMI 21.21 kg/m2 Well nourished, well developed, in no acute distress HEENT: Pupils are equal round react to light accommodation extraocular movements are intact.  Neck: no JVDNo cervical lymphadenopathy. Cardiac: Regular rhythm and rate elevated without murmurs rubs or gallops. Lungs:  clear to auscultation bilaterally, no wheezing, rhonchi or rales Abd: soft, nontender, positive bowel sounds all quadrants, no hepatosplenomegaly Ext: no lower extremity edema.  2+ radial and dorsalis pedis pulses. Skin: warm and dry Neuro:  Grossly normal  EKG: Atrial fibrillation flutter.  heart rate 139     ASSESSMENT AND PLAN:  Problem List Items Addressed This Visit    Troponin level elevated   S/P mitral  valve repair   S/P CABG x 1   S/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 + mitral valve repair + CABG x1   Long-term (current) use of anticoagulants   Atrial flutter with rapid ventricular response (HCC)   Relevant Medications   amiodarone (PACERONE) 200 MG tablet   Aortic valve insufficiency, severe - Primary   Relevant Medications   amiodarone (PACERONE) 200 MG tablet   Other Relevant Orders   EKG 12-Lead        Atrial flutter with a rapid ventricular response Patient was restarted on amiodarone 400 mg twice daily. He is already on Coumadin. I will call him tomorrow and see what his heart rate is. We'll also check an EKG on Friday. His blood pressure will not tolerate an increase in metoprolol or addition of cardizem. I  don't think amiodarone is a good long-term choice.  He may need a DC cardioversion.  Troponin elevation 0.23 on October 1. Sounds like demand ischemia in the setting of atrial flutter with rapid ventricular response.  He does have residual coronary disease in the ost LPDA to LPDA lesion which is 90%.  Description of his chest pain sounds atypical however.

## 2014-11-30 ENCOUNTER — Ambulatory Visit (INDEPENDENT_AMBULATORY_CARE_PROVIDER_SITE_OTHER): Payer: BC Managed Care – PPO | Admitting: Pharmacist Clinician (PhC)/ Clinical Pharmacy Specialist

## 2014-11-30 ENCOUNTER — Telehealth: Payer: Self-pay | Admitting: Physician Assistant

## 2014-11-30 DIAGNOSIS — Z9889 Other specified postprocedural states: Secondary | ICD-10-CM

## 2014-11-30 DIAGNOSIS — Z954 Presence of other heart-valve replacement: Secondary | ICD-10-CM

## 2014-11-30 DIAGNOSIS — I480 Paroxysmal atrial fibrillation: Secondary | ICD-10-CM

## 2014-11-30 DIAGNOSIS — Z7901 Long term (current) use of anticoagulants: Secondary | ICD-10-CM

## 2014-11-30 DIAGNOSIS — Z953 Presence of xenogenic heart valve: Secondary | ICD-10-CM

## 2014-11-30 LAB — POCT INR: INR: 2.6

## 2014-11-30 NOTE — Telephone Encounter (Signed)
Patient reports that last night when he checked his heart rate was 85 and then this morning it was 120. We'll continue with amiodarone and set him up to see Doristine Devoid in A. fib clinic.  I do not think that amiodarone is a good long-term medication for this patient as he is only 64 years old.  He may just need more time to heal from his surgery and perhaps 3 months of amiodarone is a good option at this point.   Tavyn Kurka, PAC

## 2014-12-01 ENCOUNTER — Ambulatory Visit (INDEPENDENT_AMBULATORY_CARE_PROVIDER_SITE_OTHER): Payer: BC Managed Care – PPO | Admitting: *Deleted

## 2014-12-01 DIAGNOSIS — I4892 Unspecified atrial flutter: Secondary | ICD-10-CM | POA: Diagnosis not present

## 2014-12-01 DIAGNOSIS — I351 Nonrheumatic aortic (valve) insufficiency: Secondary | ICD-10-CM

## 2014-12-01 MED ORDER — FUROSEMIDE 40 MG PO TABS: 20.0000 mg | ORAL_TABLET | Freq: Every day | ORAL | Status: DC | PRN

## 2014-12-01 MED ORDER — AMIODARONE HCL 200 MG PO TABS: ORAL_TABLET | ORAL | Status: DC

## 2014-12-01 NOTE — Progress Notes (Signed)
EKG and orthostatic vitals discussed with brian hager pa-c. Pt instructed to stop furosemide and potassium. He can take 20 mg of furosemide as needed for weight gain of 3 lbs overnigt or 5 lbs in one week. On Tuesday next week he will decrease amiodarone to 400 mg once daily. He will be set up to see the atrial fib clinic next week.

## 2014-12-01 NOTE — Progress Notes (Signed)
Pt here for EKG, after standing in the lobby, became dizzy and unsteady on his feet. Pt placed in room and after lying down dizziness resolved. This is the first time this has happened. EKG shows atrial flutter with rate 67.

## 2014-12-01 NOTE — Patient Instructions (Signed)
Medication Instructions:   STOP FUROSEMIDE  STOP POTASSIUM  TAKE 20 MG OR 1/2 TABLET OF FUROSEMIDE AS NEEDED FOR WEIGHT GAIN OF 3 LBS IN ONE DAY OR 5 LBS IN ONE WEEK.  Tuesday 12-05-14 DECREASE AMIODARONE TO 400 MG = 2 OF THE 200 MG TABLETS ONCE DAILY   Follow-Up:  Your physician recommends that you schedule a follow-up appointment 12/07/14 @ 11 AM IN THE [facility]= [facility] [place]=[facility]KING CODE IS 0900.  Any Other Special Instructions Will Be Listed Below   USE CAUTION WHEN STANDING FROM SITTING OR LYING  INCREASE FLUID INTAKE

## 2014-12-01 NOTE — Addendum Note (Signed)
Addended by: Cristopher Estimable on: 12/01/2014 10:20 AM   Modules accepted: Orders, Medications

## 2014-12-07 ENCOUNTER — Ambulatory Visit (HOSPITAL_COMMUNITY)
Admission: RE | Admit: 2014-12-07 | Discharge: 2014-12-07 | Disposition: A | Discharge status: Home / Self Care | Payer: BC Managed Care – PPO | Source: Ambulatory Visit | Attending: Nurse Practitioner | Admitting: Nurse Practitioner

## 2014-12-07 ENCOUNTER — Encounter (HOSPITAL_COMMUNITY): Payer: Self-pay | Admitting: Nurse Practitioner

## 2014-12-07 ENCOUNTER — Telehealth: Payer: Self-pay | Admitting: *Deleted

## 2014-12-07 VITALS — BP 114/86 | HR 118 | Ht 72.0 in | Wt 160.8 lb

## 2014-12-07 DIAGNOSIS — Z954 Presence of other heart-valve replacement: Secondary | ICD-10-CM | POA: Diagnosis not present

## 2014-12-07 DIAGNOSIS — I4892 Unspecified atrial flutter: Secondary | ICD-10-CM | POA: Insufficient documentation

## 2014-12-07 DIAGNOSIS — I38 Endocarditis, valve unspecified: Secondary | ICD-10-CM | POA: Diagnosis not present

## 2014-12-07 NOTE — Progress Notes (Signed)
Patient ID: Alan [name], male   DOB: 1950-04-18, 64 y.o.   MRN: [id number]     Primary Care Physician: [name], MD Referring Physician: Tarri Fuller, PA-C    Alan [name] is a 64 y.o. male with a h/o endocarditis and ultimately had mitral valve repair and aortic valve replacement along with coronary artery bypass past grafting 1 with LIMA to LAD. His aortic valve is a 23 mm [employer] Magna Ease bovine pericardial bioprosthetic tissue valve with bovine pericardial patch enlargement of aortic root. Surgery was completed on 11/01/2014. His postop course was complicated by atrial fibrillation for which he was started on IV amiodarone and later switched to by mouth, then discontinued. Cardiac catheterization was completed on 10/23/2014 and revealed Ost LAD lesion, 60% stenosed. Prox LAD to Mid LAD lesion, 70% stenosed. Ost LPDA to LPDA lesion, 90% stenosed. Ost 2nd Mrg to 2nd Mrg lesion, 70% stenosed. 2nd Mrg lesion, 50% stenosed.  Patient was seen by B. Hager, [place] for posthospital follow-up 10/5. EKG reveals atrial flutter with a rate of 139. Seems to be relatively asymptomatic. He was seen in the emergency room on October 1 his problems with his PICC line. He was restarted on amiodarone  400 mg bid and just reduced today to 200 mg bid. He remains in aflutter with v rate of 118 bpm. The wife states many times his heart rate has been in the upper 80's at home.  He is tolerating well without any dizziness, shortness of breath or chest pain. On 10/7 while in the [employer], he had a near syncopal episode which was thought to be from dehydration. Diuretic/Kt stopped and he has not had any further dizziness. His fluid status has remained stable. He remains on PICC line/antibiotics. He is feeling well and is anxious to go back to work. Has f/u with Dr. Roxy Manns on 10/17 for f/u.  Today, he denies symptoms of palpitations, chest pain, shortness of breath, orthopnea, PND, lower  extremity edema, dizziness, presyncope, syncope, or neurologic sequela. The patient is tolerating medications without difficulties and is otherwise without complaint today.   Past Medical History  Diagnosis Date  . Restless leg syndrome   . Prostate cancer ([place])   . 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 Paroxysmal atrial fibrillation ([place]) 10/19/2014  . Paroxysmal atrial flutter ([place]) 10/19/2014  . Enterococcal bacteremia 10/19/2014  . 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 Protein-calorie malnutrition, severe ([place])   . 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 Coronary artery disease involving native coronary artery without angina pectoris 10/23/2014  . 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 Hepatitis   . 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/2014    23 mm [facility] Ease bovine pericardial bioprosthetic tissue valve with bovine pericardial patch enlargement of aortic root  . 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 S/P mitral valve repair 11/01/2014    Debridement of vegetation on anterior leaflet   Past Surgical History  Procedure Laterality Date  . Prostatectomy    . Cardiac catheterization N/A 10/23/2014    Procedure: Right/Left Heart Cath and Coronary Angiography;  Surgeon: Lorretta Harp, MD;  Location: [facility] CV LAB;  Service: Cardiovascular;  Laterality:  N/A;  [place] Dates without cardioversion N/A 10/20/2014    Procedure: TRANSESOPHAGEAL ECHOCARDIOGRAM (TEE);  Surgeon: Josue Hector, MD;  Location: [facility] ENDOSCOPY;  Service: Cardiovascular;  Laterality: N/A;  . Colonoscopy N/A 10/25/2014    Procedure: COLONOSCOPY;  Surgeon: Arta Silence, MD;  Location: [facility] ENDOSCOPY;  Service:  Endoscopy;  Laterality: N/A;  . Mitral valve repair N/A 11/01/2014    Procedure: MITRAL VALVE REPAIR with no Ring;  Surgeon: Rexene Alberts, MD;  Location: [facility];  Service: Open Heart Surgery;  Laterality: N/A;  . Coronary artery bypass graft N/A 11/01/2014    Procedure: CORONARY ARTERY BYPASS GRAFTING (CABG) x 1 using internal mammary artery;  Surgeon: Rexene Alberts, MD;  Location: [facility];  Service: Open Heart Surgery;  Laterality: N/A;  . Tee without cardioversion N/A 11/01/2014    Procedure: TRANSESOPHAGEAL ECHOCARDIOGRAM (TEE);  Surgeon: Rexene Alberts, MD;  Location: [facility] [place];  Service: Open Heart Surgery;  Laterality: N/A;  . Aortic valve replacement N/A 11/01/2014    Procedure: AORTIC VALVE REPLACEMENT (AVR);  Surgeon: Rexene Alberts, MD;  Location: [facility];  Service: Open Heart Surgery;  Laterality: N/A;  . Aortic root enlargement N/A 11/01/2014    Procedure: AORTIC ROOT ENLARGEMENT;  Surgeon: Rexene Alberts, MD;  Location: [facility];  Service: Open Heart Surgery;  Laterality: N/A;    Current Outpatient Prescriptions  Medication Sig Dispense Refill  . amiodarone (PACERONE) 200 MG tablet Take 2 tablets ( 400 ) mg ONCE DAILY 112 tablet 3  . ampicillin 2 g in sodium chloride 0.9 % 50 mL Inject 2 g into the vein every 4 (four) hours.    Marland Kitchen aspirin EC 81 MG EC tablet Take 1 tablet (81 mg total) by mouth daily.    . cefTRIAXone 2 g in dextrose 5 % 50 mL Inject 2 g into the vein every 12 (twelve) hours.    . furosemide (LASIX) 40 MG tablet Take 0.5 tablets (20 mg total) by mouth daily as needed. 30 tablet 0  . metoprolol (LOPRESSOR) 50 MG tablet Take 1.5 tablets (75 mg total) by mouth 2 (two) times daily. 90 tablet 6  . pramipexole (MIRAPEX) 1.5 MG tablet Take 1.5 mg by mouth at bedtime.    Marland Kitchen warfarin (COUMADIN) 2.5 MG tablet Take 1 tablet (2.5 mg total) by mouth daily. Or as directed by the Coumadin Clinic (Patient taking differently: Take 2.5 mg by mouth daily with supper. Or as directed by the Coumadin  Clinic) 60 tablet 1  . traMADol (ULTRAM) 50 MG tablet Take 1-2 tablets (50-100 mg total) by mouth every 4 (four) hours as needed for moderate pain. (Patient not taking: Reported on 12/07/2014) 30 tablet 0   No current facility-administered medications for this encounter.    Allergies  Allergen Reactions  . Pollen Extract Other (See Comments)    sneezing    Social History   Social History  . Marital Status: Married    Spouse Name: N/A  . Number of Children: N/A  . Years of Education: N/A   Occupational History  . Not on file.   Social History Main Topics  . Smoking status: Former Research scientist (life sciences)  . Smokeless tobacco: Not on file     Comment: Smoked a pipe age 84->28  . Alcohol Use: No     Comment: Drank from age 89->38  . Drug Use: No  . Sexual Activity: Not on file   Other Topics Concern  . Not on file   Social History  Narrative    Family History  Problem Relation Age of Onset  . CAD Neg Hx     ROS- All systems are reviewed and negative except as per the HPI above  Physical Exam: Filed Vitals:   12/07/14 1133  BP: 114/86  Pulse: 118  Height: 6' (1.829 m)  Weight: 160 lb 12.8 oz (72.938 kg)    GEN- The patient is well appearing, alert and oriented x 3 today.   Head- normocephalic, atraumatic Eyes-  Sclera clear, conjunctiva pink Ears- hearing intact Oropharynx- clear Neck- supple, no JVP Lymph- no cervical lymphadenopathy Lungs- Clear to ausculation bilaterally, normal work of breathing Heart- Rapid, regular rate and rhythm, no murmurs, rubs or gallops, PMI not laterally displaced. Sternotomy well healed.   GI- soft, NT, ND, + BS Extremities- no clubbing, cyanosis, or edema MS- no significant deformity or atrophy Skin- no rash or lesion Psych- euthymic mood, full affect Neuro- strength and sensation are intact  EKG- EKG interpolation reads Sinus tach but feel rhythm is that  atrial flutter at 118 bpm, qrs int 78 ms, qtc 454 ms. Epic records reviewed  including that of recent hospitalization, EKG's,labs and Bryan Hager's note.  Assessment and Plan: 1. A. Flutter Currently loading on amiodarone x one week, 400 mg bid and now 200 mg bid, with better rate control but has not converted. Hesitate to increase BB due to soft BP.  Bring back next Wednesday after an additional week of amiodarone loading and if has not chemically converted will set up for DCCV. Last two INR's in range, 10/1- 2.88, 9/29-3.0 Anticipate that amiodarone use will be short term, around 3 months to get thru the recovery time from surgery He will watch for any dyspnea, weight gain or PND, orthopnea and report if should occur.  2. S/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+mitral valve repair+Cabg x1 Appears to be recuperating well and is anxious to return to work  3. Endocarditis Continues with PICC line and antibiotics.  Return next Wednesday for further management, most likely schedule for DCCV.  Alan [name], [facility] [address] [place], [place] [address] [phone]

## 2014-12-07 NOTE — Telephone Encounter (Signed)
Signed order for coumadin dosing by Erasmo Downer, PharmD faxed to [facility].

## 2014-12-08 ENCOUNTER — Other Ambulatory Visit: Payer: Self-pay | Admitting: Thoracic Surgery (Cardiothoracic Vascular Surgery)

## 2014-12-08 DIAGNOSIS — Z951 Presence of aortocoronary bypass graft: Secondary | ICD-10-CM

## 2014-12-11 ENCOUNTER — Ambulatory Visit
Admission: RE | Admit: 2014-12-11 | Discharge: 2014-12-11 | Disposition: A | Discharge status: Home / Self Care | Payer: BC Managed Care – PPO | Source: Ambulatory Visit | Attending: Thoracic Surgery (Cardiothoracic Vascular Surgery) | Admitting: Thoracic Surgery (Cardiothoracic Vascular Surgery)

## 2014-12-11 ENCOUNTER — Ambulatory Visit (INDEPENDENT_AMBULATORY_CARE_PROVIDER_SITE_OTHER): Payer: Self-pay | Admitting: Thoracic Surgery (Cardiothoracic Vascular Surgery)

## 2014-12-11 ENCOUNTER — Encounter: Payer: Self-pay | Admitting: Thoracic Surgery (Cardiothoracic Vascular Surgery)

## 2014-12-11 VITALS — BP 106/79 | HR 100 | Resp 16 | Ht 72.0 in | Wt 160.0 lb

## 2014-12-11 DIAGNOSIS — Z951 Presence of aortocoronary bypass graft: Secondary | ICD-10-CM

## 2014-12-11 DIAGNOSIS — I4819 Other persistent atrial fibrillation: Secondary | ICD-10-CM

## 2014-12-11 DIAGNOSIS — I058 Other rheumatic mitral valve diseases: Secondary | ICD-10-CM

## 2014-12-11 DIAGNOSIS — I339 Acute and subacute endocarditis, unspecified: Secondary | ICD-10-CM

## 2014-12-11 DIAGNOSIS — I358 Other nonrheumatic aortic valve disorders: Secondary | ICD-10-CM

## 2014-12-11 DIAGNOSIS — I481 Persistent atrial fibrillation: Secondary | ICD-10-CM

## 2014-12-11 DIAGNOSIS — Z952 Presence of prosthetic heart valve: Secondary | ICD-10-CM

## 2014-12-11 DIAGNOSIS — Z953 Presence of xenogenic heart valve: Secondary | ICD-10-CM

## 2014-12-11 DIAGNOSIS — I340 Nonrheumatic mitral (valve) insufficiency: Secondary | ICD-10-CM

## 2014-12-11 DIAGNOSIS — I2510 Atherosclerotic heart disease of native coronary artery without angina pectoris: Secondary | ICD-10-CM

## 2014-12-11 DIAGNOSIS — I059 Rheumatic mitral valve disease, unspecified: Secondary | ICD-10-CM

## 2014-12-11 DIAGNOSIS — Z954 Presence of other heart-valve replacement: Secondary | ICD-10-CM

## 2014-12-11 DIAGNOSIS — Z9889 Other specified postprocedural states: Secondary | ICD-10-CM

## 2014-12-11 NOTE — Progress Notes (Signed)
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  Preoperative evaluation was notable for the demonstration of subacute lacunar infarcts to the brain consistent with septic emboli that were subclinical as well as a large infarct in the spleen that was associated with left-sided abdominal discomfort. The patient additionally underwent consultation with Dental Medicine and GI medicine prior to surgery.  Thorough dental exam, upper and lower endoscopy failed to identify any pathologic source of enterococcus within the GI tract.  Postoperatively the patient developed paroxysmal atrial fibrillation. He was initially treated with amiodarone but this was discontinued because of elevated liver enzymes. He was discharged from the hospital in rate-controlled atrial fibrillation on warfarin anticoagulation. In addition, the patient has remained on intravenous Rocephin and ampicillin with plans to complete a full 6 week course of antibiotics postoperatively.  Since hospital discharge he has been seen by Tarri Fuller at [facility] and Roderic Palau in the atrial fibrillation clinic.  He has remained therapeutic on warfarin and the patient has been scheduled for follow-up visit later this week to consider possible elective DC cardioversion in  the near future.  The patient reports that he feels quite well. His strength and exercise tolerance has been gradually improving. He still gets tired with activity. Over the last few days he has developed recurrence of mild bilateral lower extremity edema. He was taken off of Lasix 2 weeks ago because of the dizzy spell. Since then his weight has gradually increased 6 pounds. He denies any shortness of breath. He has minimal soreness in his chest. Appetite is improving but remains suboptimal. He is sleeping well at night. Overall he has no complaints.   Current Outpatient Prescriptions  Medication Sig Dispense Refill  . amiodarone (PACERONE) 200 MG tablet Take 2 tablets ( 400 ) mg ONCE DAILY 112 tablet 3  . ampicillin 2 g in sodium chloride 0.9 % 50 mL Inject 2 g into the vein every 4 (four) hours.    Marland Kitchen aspirin EC 81 MG EC tablet Take 1 tablet (81 mg total) by mouth daily.    . cefTRIAXone 2 g in dextrose 5 % 50 mL Inject 2 g into the vein every 12 (twelve) hours.    . metoprolol (LOPRESSOR) 50 MG tablet Take 1.5 tablets (75 mg total) by mouth 2 (two) times daily. 90 tablet 6  . pramipexole (MIRAPEX) 1.5 MG tablet Take 1.5 mg by mouth at bedtime.    Marland Kitchen warfarin (COUMADIN) 2.5 MG tablet Take 1 tablet (2.5 mg total) by mouth daily. Or as directed by the Coumadin Clinic (Patient taking differently: Take 2.5 mg by mouth daily with supper. Or as directed by the Coumadin Clinic) 60 tablet 1  . furosemide (LASIX) 40 MG tablet Take 0.5 tablets (20 mg total) by mouth daily as needed. (Patient not taking: Reported on 12/11/2014)  30 tablet 0  . traMADol (ULTRAM) 50 MG tablet Take 1-2 tablets (50-100 mg total) by mouth every 4 (four) hours as needed for moderate pain. (Patient not taking: Reported on 12/07/2014) 30 tablet 0   No current facility-administered medications for this visit.      Physical Exam:   BP 106/79 mmHg  Pulse 100  Resp 16  Ht 6' (1.829 m)  Wt 160 lb (72.576 kg)  BMI 21.70 kg/m2   SpO2 98%  General:  Well-appearing  Chest:   Clear to auscultation  CV:   Irregular rate and rhythm  Incisions:  Clean and dry and healing nicely, sternum is stable  Abdomen:  Soft and nontender  Extremities:  Warm and well-perfused  Diagnostic Tests:  CHEST - 2 VIEW  COMPARISON: One-view chest x-ray 11/25/2014.  FINDINGS: Median sternotomy for aortic valve replacement is noted. Is clipping of the left atrial appendage. Small bilateral pleural effusions persist. There is minimal atelectasis. The upper lung fields are clear. A right-sided PICC line remains in place.  IMPRESSION: 1. Persistent bilateral pleural effusions and minimal atelectasis. 2. The upper lung fields are clear.   Electronically Signed  By: San Morelle M.D.  On: 12/11/2014 13:24   Impression:  Patient is progressing reasonably well more than 1 month following aortic valve replacement with aortic root enlargement using a bioprosthetic tissue valve, mitral valve repair, coronary artery bypass grafting, and clipping of left atrial appendage. He has remained in persistent atrial fibrillation or atrial flutter. Heart rate is currently under adequate control. He has been therapeutic on Coumadin and might benefit from elective cardioversion. He is nearing completion of his planned six-week course of intravenous antibiotics.  Plan:  The patient will be referred to the infectious disease clinic for follow-up. It appears as though no follow-up appointment has been scheduled. I've instructed the patient to resume taking Lasix 20 mg daily for the next few days and to continue to keep an eye on his weight. I have encouraged patient to continue to gradually increase his physical activity as tolerated but I have reminded him to refrain from any heavy lifting or strenuous use of his arms or shoulders for at least another 2 months. Once his heart rhythm has been straightened out and he has completed his home IV  antibiotics I think he would benefit from participation in outpatient cardiac rehabilitation program.  The patient has asked about going back to work. We discussed the possibility that he might be able to return to work on a part-time basis with some physical restrictions once he has gotten his heart rhythm straightened out and he has gotten started in the cardiac rehabilitation program. It will be another 2 months before he could return to unrestricted physical activity. All of his questions have been addressed. The patient will return in 2 months for follow-up and rhythm check.    [name]. Roxy Manns, MD 12/11/2014 1:51 PM

## 2014-12-11 NOTE — Patient Instructions (Signed)
Restart lasix 20 mg daily x 5 days and record your weight every morning  Schedule a follow up appointment in the Infectious Disease Clinic  Continue to avoid any heavy lifting or strenuous use of your arms or shoulders for at least a total of three months from the time of surgery.  After three months you may gradually increase how much you lift or otherwise use your arms or chest as tolerated, with limits based upon whether or not activities lead to the return of significant discomfort.  Enroll and participate in the outpatient cardiac rehab program beginning as soon as practical once you have been released from [facility] care.  You may return to driving an automobile as long as you are no longer requiring oral narcotic pain relievers during the daytime once you have undergone cardioversion and have been released from [facility] care.  It would be wise to start driving only short distances during the daylight and gradually increase from there as you feel comfortable.

## 2014-12-12 ENCOUNTER — Ambulatory Visit (INDEPENDENT_AMBULATORY_CARE_PROVIDER_SITE_OTHER): Payer: Self-pay | Admitting: Pharmacist Clinician (PhC)/ Clinical Pharmacy Specialist

## 2014-12-12 DIAGNOSIS — Z954 Presence of other heart-valve replacement: Secondary | ICD-10-CM

## 2014-12-12 DIAGNOSIS — Z9889 Other specified postprocedural states: Secondary | ICD-10-CM

## 2014-12-12 DIAGNOSIS — I480 Paroxysmal atrial fibrillation: Secondary | ICD-10-CM

## 2014-12-12 DIAGNOSIS — Z7901 Long term (current) use of anticoagulants: Secondary | ICD-10-CM

## 2014-12-12 DIAGNOSIS — Z953 Presence of xenogenic heart valve: Secondary | ICD-10-CM

## 2014-12-12 LAB — POCT INR: INR: 3

## 2014-12-13 ENCOUNTER — Ambulatory Visit (HOSPITAL_COMMUNITY)
Admission: RE | Admit: 2014-12-13 | Discharge: 2014-12-13 | Disposition: A | Discharge status: Home / Self Care | Payer: BC Managed Care – PPO | Source: Ambulatory Visit | Attending: Nurse Practitioner | Admitting: Nurse Practitioner

## 2014-12-13 ENCOUNTER — Encounter (HOSPITAL_COMMUNITY): Payer: Self-pay | Admitting: Nurse Practitioner

## 2014-12-13 VITALS — BP 118/88 | HR 102 | Ht 72.0 in | Wt 164.2 lb

## 2014-12-13 DIAGNOSIS — I4892 Unspecified atrial flutter: Secondary | ICD-10-CM | POA: Diagnosis present

## 2014-12-13 DIAGNOSIS — Z954 Presence of other heart-valve replacement: Secondary | ICD-10-CM | POA: Insufficient documentation

## 2014-12-13 DIAGNOSIS — I38 Endocarditis, valve unspecified: Secondary | ICD-10-CM | POA: Insufficient documentation

## 2014-12-13 LAB — CBC
HEMATOCRIT: 39.7 % (ref 39.0–52.0)
HEMOGLOBIN: 12.6 g/dL — AB (ref 13.0–17.0)
MCH: 27.5 pg (ref 26.0–34.0)
MCHC: 31.7 g/dL (ref 30.0–36.0)
MCV: 86.7 fL (ref 78.0–100.0)
Platelets: 200 10*3/uL (ref 150–400)
RBC: 4.58 MIL/uL (ref 4.22–5.81)
RDW: 16.6 % — ABNORMAL HIGH (ref 11.5–15.5)
WBC: 5.3 10*3/uL (ref 4.0–10.5)

## 2014-12-13 LAB — BASIC METABOLIC PANEL
ANION GAP: 7 (ref 5–15)
BUN: 12 mg/dL (ref 6–20)
CHLORIDE: 101 mmol/L (ref 101–111)
CO2: 27 mmol/L (ref 22–32)
Calcium: 9.3 mg/dL (ref 8.9–10.3)
Creatinine, Ser: 1.31 mg/dL — ABNORMAL HIGH (ref 0.61–1.24)
GFR, EST NON AFRICAN AMERICAN: 55 mL/min — AB (ref >60)
Glucose, Bld: 98 mg/dL (ref 65–99)
POTASSIUM: 4.1 mmol/L (ref 3.5–5.1)
Sodium: 135 mmol/L (ref 135–145)

## 2014-12-13 NOTE — Patient Instructions (Signed)
Cardioversion scheduled for Monday, October 24th  - Arrive at the [facility] and go to admitting at [facility] not eat or drink anything after midnight the night prior to your procedure.  - Take all your medication with a sip of water prior to arrival.  - You will not be able to drive home after your procedure.

## 2014-12-13 NOTE — Progress Notes (Signed)
Patient ID: Alan [name], male   DOB: 11/05/1950, 64 y.o.   MRN: [id number]        Primary Care Physician: [name], MD Referring Physician:Bryan Samara Snide, PA-C  CV surgeon: Dr. Robbie Louis is a 64 y.o. male with a h/o endocarditis and ultimately had mitral valve repair and aortic valve replacement along with coronary artery bypass past grafting 1 with LIMA to LAD. His aortic valve is a 23 mm [employer] Magna Ease bovine pericardial bioprosthetic tissue valve with bovine pericardial patch enlargement of aortic root. Surgery was completed on 11/01/2014. His postop course was complicated by atrial fibrillation for which he was started on IV amiodarone and later switched to by mouth, then discontinued. Cardiac catheterization was completed on 10/23/2014 and revealed Ost LAD lesion, 60% stenosed. Prox LAD to Mid LAD lesion, 70% stenosed. Ost LPDA to LPDA lesion, 90% stenosed. Ost 2nd Mrg to 2nd Mrg lesion, 70% stenosed. 2nd Mrg lesion, 50% stenosed.  Patient was seen by B. Hager, [place] for posthospital follow-up 10/5. EKG reveals atrial flutter with a rate of 139. Seems to be relatively asymptomatic. He was seen in the emergency room on October 1 his problems with his PICC line. He was restarted on amiodarone  400 mg bid and just reduced today to 200 mg bid. He remains in aflutter with v rate of 118 bpm. The wife states many times his heart rate has been in the upper 80's at home.  He is tolerating well without any dizziness, shortness of breath or chest pain. On 10/7 while in the [employer], he had a near syncopal episode which was thought to be from dehydration. Diuretic/Kt stopped and he has not had any further dizziness. His fluid status has remained stable. He remains on PICC line/antibiotics. He is feeling well and is anxious to go back to work. Has f/u with Dr. Roxy Manns on 10/17.  He returns today to afib clinic with amiodarone not converting pt and EKG showing  aflutter with v rate of 102 bpm. He will be set up for cardioversion. His INR's have been therapeutic for the last 3 weeks.  Today, he denies symptoms of palpitations, chest pain, shortness of breath, orthopnea, PND, lower extremity edema, dizziness, presyncope, syncope, or neurologic sequela. The patient is tolerating medications without difficulties and is otherwise without complaint today.   Past Medical History  Diagnosis Date  . Restless leg syndrome   . Prostate cancer ([place])   . 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 Paroxysmal atrial fibrillation ([place]) 10/19/2014  . Paroxysmal atrial flutter ([place]) 10/19/2014  . Enterococcal bacteremia 10/19/2014  . 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 Protein-calorie malnutrition, severe ([place])   . 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 Coronary artery disease involving native coronary artery without angina pectoris 10/23/2014  . 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 Hepatitis   . 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/2014    23 mm [facility] Ease bovine pericardial bioprosthetic tissue valve with bovine pericardial patch enlargement of aortic root  . 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 S/P mitral valve repair 11/01/2014    Debridement of vegetation on anterior leaflet   Past  Surgical History  Procedure Laterality Date  . Prostatectomy    . Cardiac catheterization N/A 10/23/2014    Procedure: Right/Left Heart Cath and Coronary Angiography;  Surgeon: Lorretta Harp, MD;  Location: [place] CV LAB;  Service: Cardiovascular;  Laterality: N/A;  . Tee without cardioversion N/A 10/20/2014    Procedure: TRANSESOPHAGEAL ECHOCARDIOGRAM (TEE);   Surgeon: Josue Hector, MD;  Location: [facility] ENDOSCOPY;  Service: Cardiovascular;  Laterality: N/A;  . Colonoscopy N/A 10/25/2014    Procedure: COLONOSCOPY;  Surgeon: Arta Silence, MD;  Location: [facility] ENDOSCOPY;  Service: Endoscopy;  Laterality: N/A;  . Mitral valve repair N/A 11/01/2014    Procedure: MITRAL VALVE REPAIR with no Ring;  Surgeon: Rexene Alberts, MD;  Location: [place];  Service: Open Heart Surgery;  Laterality: N/A;  . Coronary artery bypass graft N/A 11/01/2014    Procedure: CORONARY ARTERY BYPASS GRAFTING (CABG) x 1 using internal mammary artery;  Surgeon: Rexene Alberts, MD;  Location: [place];  Service: Open Heart Surgery;  Laterality: N/A;  . Tee without cardioversion N/A 11/01/2014    Procedure: TRANSESOPHAGEAL ECHOCARDIOGRAM (TEE);  Surgeon: Rexene Alberts, MD;  Location: [place];  Service: Open Heart Surgery;  Laterality: N/A;  . Aortic valve replacement N/A 11/01/2014    Procedure: AORTIC VALVE REPLACEMENT (AVR);  Surgeon: Rexene Alberts, MD;  Location: [place];  Service: Open Heart Surgery;  Laterality: N/A;  . Aortic root enlargement N/A 11/01/2014    Procedure: AORTIC ROOT ENLARGEMENT;  Surgeon: Rexene Alberts, MD;  Location: [place];  Service: Open Heart Surgery;  Laterality: N/A;    Current Outpatient Prescriptions  Medication Sig Dispense Refill  . amiodarone (PACERONE) 200 MG tablet Take 2 tablets ( 400 ) mg ONCE DAILY 112 tablet 3  . ampicillin 2 g in sodium chloride 0.9 % 50 mL Inject 2 g into the vein every 4 (four) hours.    Marland Kitchen aspirin EC 81 MG EC tablet Take 1 tablet (81 mg total) by mouth daily.    . cefTRIAXone 2 g in dextrose 5 % 50 mL Inject 2 g into the vein every 12 (twelve) hours.    . furosemide (LASIX) 40 MG tablet Take 0.5 tablets (20 mg total) by mouth daily as needed. 30 tablet 0  . metoprolol (LOPRESSOR) 50 MG tablet Take 1.5 tablets (75 mg total) by mouth 2 (two) times daily. 90 tablet 6  . pramipexole (MIRAPEX) 1.5 MG tablet Take 1.5 mg by mouth at bedtime.      . traMADol (ULTRAM) 50 MG tablet Take 1-2 tablets (50-100 mg total) by mouth every 4 (four) hours as needed for moderate pain. 30 tablet 0  . warfarin (COUMADIN) 2.5 MG tablet Take 1 tablet (2.5 mg total) by mouth daily. Or as directed by the [facility]mad[facility] (Patient taking differently: Take 2.5 mg by mouth daily with supper. Or as directed by the Coumadin Clinic) 60 tablet 1   No current facility-administered medications for this encounter.    Allergies  Allergen Reactions  . Pollen Extract Other (See Comments)    sneezing    Social History   Social History  . Marital Status: Married    Spouse Name: N/A  . Number of Children: N/A  . Years of Education: N/A   Occupational History  . Not on file.   Social History Main Topics  . Smoking status: Former Research scientist (life sciences)  . Smokeless tobacco: Not on file     Comment: Smoked a pipe age 19->28  .  Alcohol Use: No     Comment: Drank from age 45->38  . Drug Use: No  . Sexual Activity: Not on file   Other Topics Concern  . Not on file   Social History Narrative    Family History  Problem Relation Age of Onset  . CAD Neg Hx     ROS- All systems are reviewed and negative except as per the HPI above  Physical Exam: Filed Vitals:   12/13/14 0936  BP: 118/88  Pulse: 102  Height: 6' (1.829 m)  Weight: 164 lb 3.2 oz (74.481 kg)    GEN- The patient is well appearing, alert and oriented x 3 today.   Head- normocephalic, atraumatic Eyes-  Sclera clear, conjunctiva pink Ears- hearing intact Oropharynx- clear Neck- supple, no JVP Lymph- no cervical lymphadenopathy Lungs- Clear to ausculation bilaterally, normal work of breathing Heart- Rapid, regular rate and rhythm, no murmurs, rubs or gallops, PMI not laterally displaced. Sternotomy well healed.   GI- soft, NT, ND, + BS Extremities- no clubbing, cyanosis, or edema MS- no significant deformity or atrophy Skin- no rash or lesion Psych- euthymic mood, full affect Neuro- strength  and sensation are intact  EKG-  Aflutter with v rate of 102 bpm, qrs int 92 ms, qtc 430 ms Epic records reviewed including that of recent hospitalization, EKG's,labs and Bryan Hager's note/ Dr. Guy Sandifer notes.  Assessment and Plan: 1. A. Flutter Currently loading on amiodarone but not converted.   Will set up for DCCV 10/24 Last  INR's in range, , 9/29-3.0, 10/1-2.88, 10-6-2.6,10-18-3.0 Anticipate that amiodarone use will be short term, around 3 months to get thru the recovery time from surgery He will watch for any dyspnea, weight gain or PND, orthopnea and report if should occur.  2. S/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+mitral valve repair+Cabg x1 Appears to be recuperating well and is anxious to return to work  3. Endocarditis Continues with PICC line and antibiotics. Finishes today  Return 10/28 for f/u cardioversion  Butch Penny C. Rifka Ramey, [facility] [address] [place], [place] [address] [phone]

## 2014-12-18 ENCOUNTER — Encounter (HOSPITAL_COMMUNITY)
Admission: RE | Disposition: A | Discharge status: Home / Self Care | Payer: Self-pay | Source: Ambulatory Visit | Attending: Internal Medicine

## 2014-12-18 ENCOUNTER — Ambulatory Visit (HOSPITAL_COMMUNITY)
Admission: RE | Admit: 2014-12-18 | Discharge: 2014-12-18 | Disposition: A | Discharge status: Home / Self Care | Payer: BC Managed Care – PPO | Source: Ambulatory Visit | Attending: Internal Medicine | Admitting: Internal Medicine

## 2014-12-18 ENCOUNTER — Ambulatory Visit (HOSPITAL_COMMUNITY): Payer: BC Managed Care – PPO | Admitting: Certified Registered Nurse Anesthetist

## 2014-12-18 ENCOUNTER — Inpatient Hospital Stay: Payer: Medicare Other | Admitting: Internal Medicine

## 2014-12-18 DIAGNOSIS — I483 Typical atrial flutter: Secondary | ICD-10-CM | POA: Insufficient documentation

## 2014-12-18 DIAGNOSIS — I4892 Unspecified atrial flutter: Secondary | ICD-10-CM | POA: Insufficient documentation

## 2014-12-18 HISTORY — PX: CARDIOVERSION: SHX1299

## 2014-12-18 SURGERY — CARDIOVERSION
Anesthesia: Monitor Anesthesia Care

## 2014-12-18 MED ORDER — METOPROLOL TARTRATE 50 MG PO TABS: 25.0000 mg | ORAL_TABLET | Freq: Two times a day (BID) | ORAL | Status: DC

## 2014-12-18 MED ORDER — SODIUM CHLORIDE 0.9 % IV SOLN
INTRAVENOUS | Status: DC | PRN
  Administered 2014-12-18 (×3): via INTRAVENOUS

## 2014-12-18 MED ORDER — PROMETHAZINE HCL 25 MG/ML IJ SOLN: 6.2500 mg | INTRAMUSCULAR | Status: DC | PRN

## 2014-12-18 MED ORDER — PROPOFOL 10 MG/ML IV BOLUS
INTRAVENOUS | Status: DC | PRN
  Administered 2014-12-18: 50 mg via INTRAVENOUS
  Administered 2014-12-18: 20 mg via INTRAVENOUS

## 2014-12-18 MED ORDER — PHENYLEPHRINE HCL 10 MG/ML IJ SOLN
INTRAMUSCULAR | Status: DC | PRN
  Administered 2014-12-18: 80 ug via INTRAVENOUS

## 2014-12-18 MED ORDER — EPHEDRINE SULFATE 50 MG/ML IJ SOLN
INTRAMUSCULAR | Status: DC | PRN
  Administered 2014-12-18: 10 mg via INTRAVENOUS
  Administered 2014-12-18: 5 mg via INTRAVENOUS

## 2014-12-18 MED ORDER — LIDOCAINE HCL (CARDIAC) 20 MG/ML IV SOLN
INTRAVENOUS | Status: DC | PRN
  Administered 2014-12-18: 60 mg via INTRAVENOUS

## 2014-12-18 NOTE — CV Procedure (Signed)
    CARDIOVERSION NOTE  Procedure: Electrical Cardioversion Indications:  Atrial Flutter  Procedure Details:  Consent: Risks of procedure as well as the alternatives and risks of each were explained to the (patient/caregiver).  Consent for procedure obtained.  Time Out: Verified patient identification, verified procedure, site/side was marked, verified correct patient position, special equipment/implants available, medications/allergies/relevent history reviewed, required imaging and test results available.  Performed  Patient placed on cardiac monitor, pulse oximetry, supplemental oxygen as necessary.  Sedation given: Propofol per anesthesia Pacer pads placed anterior and posterior chest.  Cardioverted 1 time(s).  Cardioverted at 120J biphasic.  Impression: Findings: Post procedure EKG shows: sinus bradycardia Complications: None Patient did tolerate procedure well.  Plan: 1. Successful DCCV to sinus bradycardia with a single 120J biphasic shock. 2. The patient was hypotensive prior to the procedure. He was given IV fluid boluses and additional medications per anesthesia. At the end of the case, the blood pressure had returned to baseline. I'm advising the patient not take metoprolol tonight, then he will take a reduced dose of 25 mg (1/2 tablet) BID starting tomorrow. He should remain on amiodarone. 3. Follow-up with Roderic Palau, NP in the a-fib clinic.  Time Spent Directly with the Patient:  60 minutes   Pixie Casino, MD, [facility] Attending Cardiologist [place] 12/18/2014, 2:01 PM

## 2014-12-18 NOTE — Anesthesia Postprocedure Evaluation (Signed)
  Anesthesia Post-op Note  Patient: Alan [name]  Procedure(s) Performed: Procedure(s): CARDIOVERSION (N/A)  Patient Location: Endoscopy Unit  Anesthesia Type:General  Level of Consciousness: awake, alert , oriented and patient cooperative  Airway and Oxygen Therapy: Patient Spontanous Breathing  Post-op Pain: none  Post-op Assessment: Post-op Vital signs reviewed, Patient's Cardiovascular Status Stable, Respiratory Function Stable, Patent Airway, No signs of Nausea or vomiting, Adequate PO intake, Pain level controlled, No headache and No backache              Post-op Vital Signs: Reviewed and stable  Last Vitals:  Filed Vitals:   12/18/14 1201  BP: 82/53  Temp: 36.4 C  Resp: 17    Complications: No apparent anesthesia complications

## 2014-12-18 NOTE — H&P (Signed)
     INTERVAL PROCEDURE H&P  History and Physical Interval Note:  12/18/2014 12:36 PM  Sahir A Costley has presented today for their planned procedure. The various methods of treatment have been discussed with the patient and family. After consideration of risks, benefits and other options for treatment, the patient has consented to the procedure.  The patients' outpatient history has been reviewed, patient examined, and no change in status from most recent office note within the past 30 days. I have reviewed the patients' chart and labs and will proceed as planned. Questions were answered to the patient's satisfaction.   Pixie Casino, MD, [facility] Attending Cardiologist [place] 12/18/2014, 12:36 PM

## 2014-12-18 NOTE — Discharge Instructions (Signed)
Electrical Cardioversion, Care After °Refer to this sheet in the next few weeks. These instructions provide you with information on caring for yourself after your procedure. Your health care provider may also give you more specific instructions. Your treatment has been planned according to current medical practices, but problems sometimes occur. Call your health care provider if you have any problems or questions after your procedure. °WHAT TO EXPECT AFTER THE PROCEDURE °After your procedure, it is typical to have the following sensations: °· Some redness on the skin where the shocks were delivered. If this is tender, a sunburn lotion or hydrocortisone cream may help. °· Possible return of an abnormal heart rhythm within hours or days after the procedure. °HOME CARE INSTRUCTIONS °· Take medicines only as directed by your health care provider. Be sure you understand how and when to take your medicine. °· Learn how to feel your pulse and check it often. °· Limit your activity for 48 hours after the procedure or as directed by your health care provider. °· Avoid or minimize caffeine and other stimulants as directed by your health care provider. °SEEK MEDICAL CARE IF: °· You feel like your heart is beating too fast or your pulse is not regular. °· You have any questions about your medicines. °· You have bleeding that will not stop. °SEEK IMMEDIATE MEDICAL CARE IF: °· You are dizzy or feel faint. °· It is hard to breathe or you feel short of breath. °· There is a change in discomfort in your chest. °· Your speech is slurred or you have trouble moving an arm or leg on one side of your body. °· You get a serious muscle cramp that does not go away. °· Your fingers or toes turn cold or blue. °  °This information is not intended to replace advice given to you by your health care provider. Make sure you discuss any questions you have with your health care provider. °  °Document Released: 12/01/2012 Document Revised: 03/03/2014  Document Reviewed: 12/01/2012 °Elsevier Interactive Patient Education ©2016 [employer] ° °

## 2014-12-18 NOTE — Anesthesia Preprocedure Evaluation (Signed)
Anesthesia Evaluation  Patient identified by MRN, date of birth, ID band Patient awake    Reviewed: Allergy & Precautions, NPO status , Patient's Chart, lab work & pertinent test results  Airway Mallampati: II  TM Distance: >3 FB Neck ROM: Full    Dental no notable dental hx.    Pulmonary neg pulmonary ROS, former smoker,    Pulmonary exam normal breath sounds clear to auscultation       Cardiovascular + dysrhythmias Atrial Fibrillation  Rhythm:Irregular Rate:Normal + Systolic murmurs  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/2014 23 mm [facility] Ease bovine pericardial bioprosthetic tissue valve with bovine pericardial patch enlargement of aortic root     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 LIMA to LAD S/P mitral valve repair 11/01/2014 Debridement of vegetation on anterior leaflet     Neuro/Psych negative neurological ROS  negative psych ROS   GI/Hepatic negative GI ROS, Neg liver ROS,   Endo/Other  negative endocrine ROS  Renal/GU negative Renal ROS  negative genitourinary   Musculoskeletal negative musculoskeletal ROS (+)   Abdominal   Peds negative pediatric ROS (+)  Hematology negative hematology ROS (+)   Anesthesia Other Findings   Reproductive/Obstetrics negative OB ROS                             Anesthesia Physical Anesthesia Plan  ASA: III  Anesthesia Plan: MAC   Post-op Pain Management:    Induction: Intravenous  Airway Management Planned: Mask  Additional Equipment:   Intra-op Plan:   Post-operative Plan:   Informed Consent: I have reviewed the patients History and Physical, chart, labs and discussed the procedure including the risks, benefits and alternatives for the proposed anesthesia with the patient or authorized representative who has indicated his/her understanding and acceptance.   Dental advisory given  Plan Discussed with: CRNA and  Surgeon  Anesthesia Plan Comments:         Anesthesia Quick Evaluation

## 2014-12-18 NOTE — Transfer of Care (Signed)
Immediate Anesthesia Transfer of Care Note  Patient: Alan [name]  Procedure(s) Performed: Procedure(s): CARDIOVERSION (N/A)  Patient Location: Endoscopy Unit  Anesthesia Type:General  Level of Consciousness: awake, alert , oriented and patient cooperative  Airway & Oxygen Therapy: Patient Spontanous Breathing and Patient connected to nasal cannula oxygen  Post-op Assessment: Report given to RN and Post -op Vital signs reviewed and stable  Post vital signs: Reviewed and stable  Last Vitals:  Filed Vitals:   12/18/14 1201  BP: 82/53  Temp: 36.4 C  Resp: 17    Complications: No apparent anesthesia complications

## 2014-12-19 ENCOUNTER — Telehealth: Payer: Self-pay | Admitting: *Deleted

## 2014-12-19 ENCOUNTER — Encounter (HOSPITAL_COMMUNITY): Payer: Self-pay | Admitting: Internal Medicine

## 2014-12-19 NOTE — Telephone Encounter (Signed)
PT/INR order with coumadin dosing sent to [facility].

## 2014-12-20 ENCOUNTER — Ambulatory Visit (INDEPENDENT_AMBULATORY_CARE_PROVIDER_SITE_OTHER): Payer: Self-pay | Admitting: Pharmacist Clinician (PhC)/ Clinical Pharmacy Specialist

## 2014-12-20 DIAGNOSIS — Z7901 Long term (current) use of anticoagulants: Secondary | ICD-10-CM

## 2014-12-20 DIAGNOSIS — Z954 Presence of other heart-valve replacement: Secondary | ICD-10-CM

## 2014-12-20 DIAGNOSIS — Z953 Presence of xenogenic heart valve: Secondary | ICD-10-CM

## 2014-12-20 DIAGNOSIS — I480 Paroxysmal atrial fibrillation: Secondary | ICD-10-CM

## 2014-12-20 DIAGNOSIS — Z9889 Other specified postprocedural states: Secondary | ICD-10-CM

## 2014-12-20 LAB — POCT INR: INR: 5.1

## 2014-12-22 ENCOUNTER — Other Ambulatory Visit: Payer: Self-pay

## 2014-12-22 ENCOUNTER — Encounter (HOSPITAL_COMMUNITY): Payer: Self-pay | Admitting: Nurse Practitioner

## 2014-12-22 ENCOUNTER — Ambulatory Visit (HOSPITAL_COMMUNITY)
Admission: RE | Admit: 2014-12-22 | Discharge: 2014-12-22 | Disposition: A | Discharge status: Home / Self Care | Payer: BC Managed Care – PPO | Source: Ambulatory Visit | Attending: Nurse Practitioner | Admitting: Nurse Practitioner

## 2014-12-22 VITALS — BP 118/72 | HR 53 | Ht 72.0 in | Wt 167.5 lb

## 2014-12-22 DIAGNOSIS — Z79899 Other long term (current) drug therapy: Secondary | ICD-10-CM | POA: Insufficient documentation

## 2014-12-22 DIAGNOSIS — Z951 Presence of aortocoronary bypass graft: Secondary | ICD-10-CM | POA: Diagnosis not present

## 2014-12-22 DIAGNOSIS — Z7901 Long term (current) use of anticoagulants: Secondary | ICD-10-CM | POA: Insufficient documentation

## 2014-12-22 DIAGNOSIS — G2581 Restless legs syndrome: Secondary | ICD-10-CM | POA: Diagnosis not present

## 2014-12-22 DIAGNOSIS — Z87891 Personal history of nicotine dependence: Secondary | ICD-10-CM | POA: Insufficient documentation

## 2014-12-22 DIAGNOSIS — Z953 Presence of xenogenic heart valve: Secondary | ICD-10-CM | POA: Diagnosis not present

## 2014-12-22 DIAGNOSIS — Z8546 Personal history of malignant neoplasm of prostate: Secondary | ICD-10-CM | POA: Diagnosis not present

## 2014-12-22 DIAGNOSIS — Z7982 Long term (current) use of aspirin: Secondary | ICD-10-CM | POA: Insufficient documentation

## 2014-12-22 DIAGNOSIS — I2510 Atherosclerotic heart disease of native coronary artery without angina pectoris: Secondary | ICD-10-CM | POA: Insufficient documentation

## 2014-12-22 DIAGNOSIS — I4892 Unspecified atrial flutter: Secondary | ICD-10-CM

## 2014-12-22 DIAGNOSIS — I4891 Unspecified atrial fibrillation: Secondary | ICD-10-CM | POA: Diagnosis present

## 2014-12-22 MED ORDER — METOPROLOL TARTRATE 25 MG PO TABS: 12.5000 mg | ORAL_TABLET | Freq: Two times a day (BID) | ORAL | Status: DC

## 2014-12-22 NOTE — Progress Notes (Addendum)
Patient ID: GERMAINE SHENKER, male   DOB: 1950-06-19, 64 y.o.   MRN: [id number]        Primary Care Physician: [name], MD Referring Physician:Bryan Samara Snide, PA-C  CV surgeon: Dr. Robbie Louis is a 64 y.o. male with a h/o endocarditis and ultimately had mitral valve repair and aortic valve replacement along with coronary artery bypass past grafting 1 with LIMA to LAD. His aortic valve is a 23 mm [employer] Magna Ease bovine pericardial bioprosthetic tissue valve with bovine pericardial patch enlargement of aortic root. Surgery was completed on 11/01/2014. His postop course was complicated by atrial fibrillation for which he was started on IV amiodarone and later switched to by mouth, then discontinued. Cardiac catheterization was completed on 10/23/2014 and revealed Ost LAD lesion, 60% stenosed. Prox LAD to Mid LAD lesion, 70% stenosed. Ost LPDA to LPDA lesion, 90% stenosed. Ost 2nd Mrg to 2nd Mrg lesion, 70% stenosed. 2nd Mrg lesion, 50% stenosed.  Patient was seen by B. Hager, [place] for posthospital follow-up 10/5. EKG reveals atrial flutter with a rate of 139. Seems to be relatively asymptomatic. He was seen in the emergency room on October 1 his problems with his PICC line. He was restarted on amiodarone  400 mg bid and just reduced today to 200 mg bid. He remains in aflutter with v rate of 118 bpm. The wife states many times his heart rate has been in the upper 80's at home.  He is tolerating well without any dizziness, shortness of breath or chest pain. On 10/7 while in the [employer], he had a near syncopal episode which was thought to be from dehydration. Diuretic/Kt stopped and he has not had any further dizziness. His fluid status has remained stable. He remains on PICC line/antibiotics. He is feeling well and is anxious to go back to work. Had f/u with Dr. Roxy Manns on 10/17.  He returned  to afib clinic. 10/19, with amiodarone not converting pt and EKG  showing aflutter with v rate of 102 bpm. He was  set up for cardioversion. His INR's had been therapeutic for the last 3 weeks.  He had successful cardioversion 10/24. He was found to be hypotensive at time of cardioversion, extra fluids were given, diuretic was stopped.He  is in sinus brady at 53 bpm and experiencing some dizziness/lightheadedness at times, which may be from bradycardia. Will reduce amount of bb on board today. INR recently at 5.1 probably due to amiodarone and coumadin is currently on hold, with instructions on when to resume.  He is still out of work and per Dr. Roxy Manns said he can probably return to work around the 21st. He sees Dr. Loletha Grayer on 11/ 17 and can be further discussed at that time.  Today, he denies symptoms of palpitations, chest pain, shortness of breath, orthopnea, PND, lower extremity edema,  presyncope, syncope, or neurologic sequela. Mildly dizzy at times The patient is tolerating medications without difficulties and is otherwise without complaint today.   Past Medical History  Diagnosis Date  . Restless leg syndrome   . Prostate cancer ([place])   . 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 Paroxysmal atrial fibrillation ([place]) 10/19/2014  . Paroxysmal atrial flutter ([place]) 10/19/2014  . Enterococcal bacteremia 10/19/2014  . 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 Protein-calorie malnutrition, severe ([place])   .  Septic embolism ([place]) 10/20/2014    brain and spleen, subclinical  . Coronary artery disease involving native coronary artery without angina pectoris 10/23/2014  . 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 Hepatitis   . 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/2014    23 mm [facility] Ease bovine  pericardial bioprosthetic tissue valve with bovine pericardial patch enlargement of aortic root  . 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 S/P mitral valve repair 11/01/2014    Debridement of vegetation on anterior leaflet   Past Surgical History  Procedure Laterality Date  . Prostatectomy    . Cardiac catheterization N/A 10/23/2014    Procedure: Right/Left Heart Cath and Coronary Angiography;  Surgeon: Lorretta Harp, MD;  Location: [place] [facility];  Service: Cardiovascular;  Laterality: N/A;  . Tee without cardioversion N/A 10/20/2014    Procedure: TRANSESOPHAGEAL ECHOCARDIOGRAM (TEE);  Surgeon: Josue Hector, MD;  Location: [facility] ENDOSCOPY;  Service: Cardiovascular;  Laterality: N/A;  . Colonoscopy N/A 10/25/2014    Procedure: COLONOSCOPY;  Surgeon: Arta Silence, MD;  Location: [facility] ENDOSCOPY;  Service: Endoscopy;  Laterality: N/A;  . Mitral valve repair N/A 11/01/2014    Procedure: MITRAL VALVE REPAIR with no Ring;  Surgeon: Rexene Alberts, MD;  Location: [place];  Service: Open Heart Surgery;  Laterality: N/A;  . Coronary artery bypass graft N/A 11/01/2014    Procedure: CORONARY ARTERY BYPASS GRAFTING (CABG) x 1 using internal mammary artery;  Surgeon: Rexene Alberts, MD;  Location: [place];  Service: Open Heart Surgery;  Laterality: N/A;  . Tee without cardioversion N/A 11/01/2014    Procedure: TRANSESOPHAGEAL ECHOCARDIOGRAM (TEE);  Surgeon: Rexene Alberts, MD;  Location: [place];  Service: Open Heart Surgery;  Laterality: N/A;  . Aortic valve replacement N/A 11/01/2014    Procedure: AORTIC VALVE REPLACEMENT (AVR);  Surgeon: Rexene Alberts, MD;  Location: [place];  Service: Open Heart Surgery;  Laterality: N/A;  . Aortic root enlargement N/A 11/01/2014    Procedure: AORTIC ROOT ENLARGEMENT;  Surgeon: Rexene Alberts, MD;  Location: [place];  Service: Open Heart Surgery;  Laterality: N/A;  . Cardioversion N/A 12/18/2014    Procedure: CARDIOVERSION;  Surgeon: Pixie Casino, MD;  Location: [facility] ENDOSCOPY;   Service: Cardiovascular;  Laterality: N/A;    Current Outpatient Prescriptions  Medication Sig Dispense Refill  . amiodarone (PACERONE) 200 MG tablet Take 200 mg by mouth daily.    Marland Kitchen aspirin EC 81 MG EC tablet Take 1 tablet (81 mg total) by mouth daily.    . furosemide (LASIX) 40 MG tablet Take 0.5 tablets (20 mg total) by mouth daily as needed. (Patient taking differently: Take 20 mg by mouth daily as needed for fluid. ) 30 tablet 0  . metoprolol (LOPRESSOR) 25 MG tablet Take 0.5 tablets (12.5 mg total) by mouth 2 (two) times daily. 30 tablet 3  . pramipexole (MIRAPEX) 1.5 MG tablet Take 1.5 mg by mouth at bedtime.    . traMADol (ULTRAM) 50 MG tablet Take 1-2 tablets (50-100 mg total) by mouth every 4 (four) hours as needed for moderate pain. 30 tablet 0  . warfarin (COUMADIN) 2.5 MG tablet Take 1 tablet (2.5 mg total) by mouth daily. Or as directed by the [facility]madin Clinic (Patient taking differently: Take 2.5 mg by mouth daily with supper. Or as directed by the Coumadin Clinic) 60 tablet  1   No current facility-administered medications for this encounter.    Allergies  Allergen Reactions  . Pollen Extract Other (See Comments)    sneezing    Social History   Social History  . Marital Status: Married    Spouse Name: N/A  . Number of Children: N/A  . Years of Education: N/A   Occupational History  . Not on file.   Social History Main Topics  . Smoking status: Former Research scientist (life sciences)  . Smokeless tobacco: Not on file     Comment: Smoked a pipe age 40->28  . Alcohol Use: No     Comment: Drank from age 20->38  . Drug Use: No  . Sexual Activity: Not on file   Other Topics Concern  . Not on file   Social History Narrative    Family History  Problem Relation Age of Onset  . CAD Neg Hx     ROS- All systems are reviewed and negative except as per the HPI above  Physical Exam: Filed Vitals:   12/22/14 1028  BP: 118/72  Pulse: 53  Height: 6' (1.829 m)  Weight: 167 lb 8 oz  (75.978 kg)    GEN- The patient is well appearing, alert and oriented x 3 today.   Head- normocephalic, atraumatic Eyes-  Sclera clear, conjunctiva pink Ears- hearing intact Oropharynx- clear Neck- supple, no JVP Lymph- no cervical lymphadenopathy Lungs- Clear to ausculation bilaterally, normal work of breathing Heart- Regular rate and rhythm, no murmurs, rubs or gallops, PMI not laterally displaced. Sternotomy well healed.   GI- soft, NT, ND, + BS Extremities- no clubbing, cyanosis, or edema MS- no significant deformity or atrophy Skin- no rash or lesion Psych- euthymic mood, full affect Neuro- strength and sensation are intact  EKG-  Sinus brady at 53 bpm, pr int 188 ms, qrs int 94 ms, qtc 474 ms Epic records reviewed including that of recent hospitalization, EKG's,labs and Bryan Hager's note/ Dr. Guy Sandifer notes.  Assessment and Plan: 1. A. Flutter s/p successful cardioversion Continue on amiodarone for now but hopefully will be short term and can be stopped in 3-6 months   Continue warfarin with recent adjustment of dose per coumadin clinic, being supra therapeutic Decrease metoprolol to 25 mg, 1/2 tab bid,  For symptomatic bradycardia.  2. S/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+mitral valve repair+Cabg x1 Appears to be recuperating well and is anxious to return to work Will obtain final approval  to return to work on f/u with Dr. Loletha Grayer, Dr Roxy Manns anticipates around 11/21.  3. Endocarditis Finished with antibiotics Pic line removed  Dr. Loletha Grayer as scheduled 11/17 Afib clinic as needed  Butch Penny C. Thomasa Heidler, [facility] [address] [place], [place] [address] [phone]

## 2014-12-25 ENCOUNTER — Ambulatory Visit (INDEPENDENT_AMBULATORY_CARE_PROVIDER_SITE_OTHER): Payer: Self-pay | Admitting: Pharmacist

## 2014-12-25 ENCOUNTER — Telehealth: Payer: Self-pay | Admitting: Pharmacist Clinician (PhC)/ Clinical Pharmacy Specialist

## 2014-12-25 DIAGNOSIS — Z9889 Other specified postprocedural states: Secondary | ICD-10-CM

## 2014-12-25 DIAGNOSIS — I480 Paroxysmal atrial fibrillation: Secondary | ICD-10-CM

## 2014-12-25 DIAGNOSIS — Z954 Presence of other heart-valve replacement: Secondary | ICD-10-CM

## 2014-12-25 DIAGNOSIS — Z7901 Long term (current) use of anticoagulants: Secondary | ICD-10-CM

## 2014-12-25 DIAGNOSIS — Z953 Presence of xenogenic heart valve: Secondary | ICD-10-CM

## 2014-12-25 LAB — POCT INR: INR: 2

## 2014-12-25 NOTE — Telephone Encounter (Signed)
Spoke with pt and LMOM for Lori with [facility].  See anticoag clinic note for dosing details.

## 2014-12-25 NOTE — Telephone Encounter (Signed)
PT--INR results---PT 24.5---INR 2.0---please refer to Kristin's notes from last week.

## 2015-01-01 ENCOUNTER — Ambulatory Visit (INDEPENDENT_AMBULATORY_CARE_PROVIDER_SITE_OTHER): Payer: Self-pay | Admitting: Pharmacist Clinician (PhC)/ Clinical Pharmacy Specialist

## 2015-01-01 DIAGNOSIS — Z7901 Long term (current) use of anticoagulants: Secondary | ICD-10-CM

## 2015-01-01 DIAGNOSIS — Z954 Presence of other heart-valve replacement: Secondary | ICD-10-CM

## 2015-01-01 DIAGNOSIS — Z9889 Other specified postprocedural states: Secondary | ICD-10-CM

## 2015-01-01 DIAGNOSIS — Z953 Presence of xenogenic heart valve: Secondary | ICD-10-CM

## 2015-01-01 DIAGNOSIS — I480 Paroxysmal atrial fibrillation: Secondary | ICD-10-CM

## 2015-01-01 LAB — POCT INR: INR: 1.6

## 2015-01-02 ENCOUNTER — Other Ambulatory Visit: Payer: Self-pay | Admitting: Physician Assistant

## 2015-01-04 ENCOUNTER — Telehealth: Payer: Self-pay | Admitting: *Deleted

## 2015-01-04 NOTE — Telephone Encounter (Signed)
Signed order for PT/INR faxed to [facility]. 

## 2015-01-08 ENCOUNTER — Ambulatory Visit (INDEPENDENT_AMBULATORY_CARE_PROVIDER_SITE_OTHER): Payer: Self-pay | Admitting: Pharmacist Clinician (PhC)/ Clinical Pharmacy Specialist

## 2015-01-08 DIAGNOSIS — Z7901 Long term (current) use of anticoagulants: Secondary | ICD-10-CM

## 2015-01-08 DIAGNOSIS — Z9889 Other specified postprocedural states: Secondary | ICD-10-CM

## 2015-01-08 DIAGNOSIS — Z954 Presence of other heart-valve replacement: Secondary | ICD-10-CM

## 2015-01-08 DIAGNOSIS — Z953 Presence of xenogenic heart valve: Secondary | ICD-10-CM

## 2015-01-08 DIAGNOSIS — I480 Paroxysmal atrial fibrillation: Secondary | ICD-10-CM

## 2015-01-08 LAB — POCT INR: INR: 1.6

## 2015-01-09 ENCOUNTER — Telehealth: Payer: Self-pay | Admitting: *Deleted

## 2015-01-09 ENCOUNTER — Encounter: Payer: Self-pay | Admitting: Internal Medicine

## 2015-01-09 ENCOUNTER — Ambulatory Visit (INDEPENDENT_AMBULATORY_CARE_PROVIDER_SITE_OTHER): Payer: BC Managed Care – PPO | Admitting: Internal Medicine

## 2015-01-09 VITALS — BP 126/85 | HR 111 | Temp 97.5°F | Ht 72.0 in | Wt 156.5 lb

## 2015-01-09 DIAGNOSIS — I2510 Atherosclerotic heart disease of native coronary artery without angina pectoris: Secondary | ICD-10-CM

## 2015-01-09 DIAGNOSIS — I358 Other nonrheumatic aortic valve disorders: Secondary | ICD-10-CM | POA: Diagnosis not present

## 2015-01-09 DIAGNOSIS — Z23 Encounter for immunization: Secondary | ICD-10-CM | POA: Diagnosis not present

## 2015-01-09 NOTE — Progress Notes (Signed)
[facility] for Infectious Disease  Patient Active Problem List   Diagnosis Date Noted  . Enterococcal bacteremia 10/19/2014    Priority: High  . Aortic valve endocarditis - Enterococcus 10/19/2014    Priority: High  . 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 Typical atrial flutter ([place])   . Long-term (current) use of anticoagulants 11/17/2014  . 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  . S/P mitral valve repair 11/01/2014  . Dental anomaly   . Subacute endocarditis   . 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 Endocarditis   . Protein-calorie malnutrition, severe ([place])   . Coronary artery disease involving native coronary artery without angina pectoris 10/23/2014  . Septic embolism ([place])   . Leukocytosis   . Troponin level elevated   . Atherosclerotic peripheral vascular disease ([place]) 10/20/2014  . Aortic valve insufficiency, severe 10/20/2014  . 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 Paroxysmal atrial fibrillation ([place]) 10/19/2014  . Atrial flutter with rapid ventricular response ([place]) 10/19/2014  . Acute encephalopathy 10/18/2014  . Rash 10/18/2014  . Sepsis ([place]) 10/18/2014  . Normocytic anemia 10/18/2014  . Dizziness 10/18/2014  . Restless leg syndrome   . Prostate cancer ([place])     Patient's Medications  New Prescriptions   No medications on file  Previous Medications   AMIODARONE (PACERONE) 200 MG TABLET    Take 200 mg by mouth daily.   ASPIRIN EC 81 MG EC TABLET    Take 1 tablet (81 mg total) by mouth daily.   FUROSEMIDE (LASIX) 40 MG TABLET    Take 0.5 tablets (20 mg total) by mouth daily as needed.   METOPROLOL (LOPRESSOR) 25 MG TABLET    Take 0.5 tablets (12.5 mg total) by mouth 2 (two) times daily.   PRAMIPEXOLE (MIRAPEX) 1.5 MG TABLET    Take 1.5 mg by mouth at bedtime.   TRAMADOL  (ULTRAM) 50 MG TABLET    Take 1-2 tablets (50-100 mg total) by mouth every 4 (four) hours as needed for moderate pain.   WARFARIN (COUMADIN) 2.5 MG TABLET    Take 1 tablet (2.5 mg total) by mouth daily. Or as directed by the Coumadin Clinic  Modified Medications   No medications on file  Discontinued Medications   No medications on file    Subjective: Alan [name] is in for his hospital follow-up visit. 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 He underwent aortic valve replacement and mitral valve repair. His endocarditis was complicated by multiple septic emboli to his brain and spleen. He received a total of 8 weeks of IV antibiotic therapy with ampicillin and ceftriaxone including 6 weeks of therapy postoperatively. He completed antibiotic therapy about one month ago and have his PICC removed. He had no problems tolerating his antibiotics or PICC. He states that he continues to improve slowly. He is feeling stronger. His appetite is improving slowly and his taste for food is coming back. He has not had any fever, chills or sweats since he was discharged.  Review of Systems: Review of Systems  Constitutional: Negative for fever, chills, weight loss, malaise/fatigue and diaphoresis.       He is slowly regaining the 40 pounds that he lost during his illness.  HENT: Negative  for sore throat.   Respiratory: Negative for cough, sputum production and shortness of breath.   Cardiovascular: Negative for chest pain.       He still has some incisional soreness but this is improving.  Gastrointestinal: Negative for nausea, vomiting and diarrhea.  Genitourinary: Negative for dysuria and frequency.  Musculoskeletal: Negative for myalgias and joint pain.  Skin: Negative for rash.  Neurological: Negative for focal weakness.  Psychiatric/Behavioral: Negative for depression and substance abuse. The patient is not  nervous/anxious.     Past Medical History  Diagnosis Date  . Restless leg syndrome   . Prostate cancer ([place])   . 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 Paroxysmal atrial fibrillation ([place]) 10/19/2014  . Paroxysmal atrial flutter ([place]) 10/19/2014  . Enterococcal bacteremia 10/19/2014  . 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 Protein-calorie malnutrition, severe ([place])   . Septic embolism ([place]) 10/20/2014    brain and spleen, subclinical  . Coronary artery disease involving native coronary artery without angina pectoris 10/23/2014  . 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 Hepatitis   . 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/2014    23 mm [facility] Ease bovine pericardial bioprosthetic tissue valve with bovine pericardial patch enlargement of aortic root  . 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 S/P mitral valve repair 11/01/2014    Debridement of vegetation on anterior leaflet    Social History  Substance Use Topics  . Smoking status: Former Research scientist (life sciences)  . Smokeless tobacco: None     Comment: Smoked a pipe age 21->28  . Alcohol Use: No     Comment: Drank from age 9->38    Family History  Problem Relation Age of Onset  . CAD Neg Hx     Allergies  Allergen Reactions  . Pollen Extract Other (See Comments)    sneezing    Objective: Filed Vitals:   01/09/15 0907  BP: 126/85  Pulse: 111  Temp: 97.5 F (36.4 C)  TempSrc: Oral  Height: 6' (1.829 m)  Weight: 156 lb 8 oz (70.988 kg)   Body mass index is 21.22 kg/(m^2).  Physical Exam  Constitutional:  He is well dressed and in no distress. He is reading a book.  HENT:  Mouth/Throat: No oropharyngeal exudate.  Eyes: Conjunctivae are normal.    Cardiovascular: Normal rate, regular rhythm and normal heart sounds.   No murmur heard. Pulmonary/Chest: Breath sounds normal.  Skin: No rash noted.  Psychiatric: Mood and affect normal.    Lab Results    Problem List Items Addressed This Visit      High   Aortic valve endocarditis - Enterococcus - Primary    I think that is highly probable that his enterococcal endocarditis has been cured with a combination of 2 months of IV antibiotic therapy and surgery. He continues to improve one month after stopping antibiotic therapy. I asked him to call me if he has any signs or symptoms of early relapse between now and his follow-up visit in one month.          Michel Bickers, MD [facility] [phone] pager   [phone] cell 01/09/2015, 9:33 AM

## 2015-01-09 NOTE — Assessment & Plan Note (Signed)
I think that is highly probable that his enterococcal endocarditis has been cured with a combination of 2 months of IV antibiotic therapy and surgery. He continues to improve one month after stopping antibiotic therapy. I asked him to call me if he has any signs or symptoms of early relapse between now and his follow-up visit in one month.

## 2015-01-09 NOTE — Telephone Encounter (Signed)
Signed order for PT/INR faxed to [facility]. 

## 2015-01-11 ENCOUNTER — Ambulatory Visit (INDEPENDENT_AMBULATORY_CARE_PROVIDER_SITE_OTHER): Payer: BC Managed Care – PPO | Admitting: Pharmacist Clinician (PhC)/ Clinical Pharmacy Specialist

## 2015-01-11 ENCOUNTER — Ambulatory Visit (INDEPENDENT_AMBULATORY_CARE_PROVIDER_SITE_OTHER): Payer: BC Managed Care – PPO | Admitting: Cardiovascular Disease

## 2015-01-11 ENCOUNTER — Encounter: Payer: Self-pay | Admitting: Cardiovascular Disease

## 2015-01-11 VITALS — BP 100/74 | HR 96 | Ht 72.0 in | Wt 160.1 lb

## 2015-01-11 DIAGNOSIS — Z953 Presence of xenogenic heart valve: Secondary | ICD-10-CM

## 2015-01-11 DIAGNOSIS — I358 Other nonrheumatic aortic valve disorders: Secondary | ICD-10-CM

## 2015-01-11 DIAGNOSIS — Z9889 Other specified postprocedural states: Secondary | ICD-10-CM | POA: Diagnosis not present

## 2015-01-11 DIAGNOSIS — Z954 Presence of other heart-valve replacement: Secondary | ICD-10-CM | POA: Diagnosis not present

## 2015-01-11 DIAGNOSIS — I351 Nonrheumatic aortic (valve) insufficiency: Secondary | ICD-10-CM

## 2015-01-11 DIAGNOSIS — I059 Rheumatic mitral valve disease, unspecified: Secondary | ICD-10-CM

## 2015-01-11 DIAGNOSIS — I4892 Unspecified atrial flutter: Secondary | ICD-10-CM | POA: Diagnosis not present

## 2015-01-11 DIAGNOSIS — I483 Typical atrial flutter: Secondary | ICD-10-CM

## 2015-01-11 DIAGNOSIS — I2510 Atherosclerotic heart disease of native coronary artery without angina pectoris: Secondary | ICD-10-CM | POA: Diagnosis not present

## 2015-01-11 DIAGNOSIS — I340 Nonrheumatic mitral (valve) insufficiency: Secondary | ICD-10-CM

## 2015-01-11 DIAGNOSIS — Z7901 Long term (current) use of anticoagulants: Secondary | ICD-10-CM

## 2015-01-11 DIAGNOSIS — Z01812 Encounter for preprocedural laboratory examination: Secondary | ICD-10-CM | POA: Diagnosis not present

## 2015-01-11 DIAGNOSIS — I480 Paroxysmal atrial fibrillation: Secondary | ICD-10-CM

## 2015-01-11 DIAGNOSIS — I339 Acute and subacute endocarditis, unspecified: Secondary | ICD-10-CM | POA: Diagnosis not present

## 2015-01-11 DIAGNOSIS — I058 Other rheumatic mitral valve diseases: Secondary | ICD-10-CM

## 2015-01-11 DIAGNOSIS — I76 Septic arterial embolism: Secondary | ICD-10-CM

## 2015-01-11 DIAGNOSIS — B999 Unspecified infectious disease: Secondary | ICD-10-CM

## 2015-01-11 LAB — POCT INR: INR: 1.9

## 2015-01-11 MED ORDER — APIXABAN 5 MG PO TABS: 5.0000 mg | ORAL_TABLET | Freq: Two times a day (BID) | ORAL | Status: DC

## 2015-01-11 NOTE — Progress Notes (Signed)
Patient ID: Alan [name], male   DOB: 04/29/1950, 64 y.o.   MRN: [id number]     Cardiology Office Note   Date:  01/12/2015   ID:  Alan [name], DOB February 16, 1951, MRN [id number]  PCP:  Alan Sewer, MD  Cardiologist:   Alan Klein, MD   Chief Complaint  Patient presents with  . ROV 1 month    patient reports some chest discomfort when doing yard work - "external bone" points to sternum, slight swelling in legs/ankles - has since resolved.      History of Present Illness: Alan [name] is a 64 y.o. 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   he presented with malaise, fever and was found to have septic emboli in his brain and spleen, severe aortic insufficiency and vegetations on both the aortic and mitral valves.  Surgery was performed on September 7. On October 24 he underwent successful elective cardioversion for  Atrial flutter.  There has been difficulty maintaining appropriate levels of anticoagulation with warfarin since that time. Amiodarone was discontinued due to liver test abnormalities. Has not had problems with congestive heart failure. Left ventricular systolic function is normal  at preop and postop testing.    He has completed a total of 8 weeks of intravenous antibiotic therapy , including 6 weeks following his surgery. He completed antibiotics about 5 weeks ago and his PICC line has been removed.  Despite upper and lower endoscopy and dental evaluation and no clear source of his enterococcal  bacteremia has been identified.   He has improving appetite, increasing energy and is gaining weight. He continues to have dysgeusia. He has been afebrile. He complains of some soreness at the sternotomy site that is clearly associated with motion. He does not have angina or pleurisy.  He has returned to work part-time at [employer]. He does not want to go to cardiac rehabilitation.  Past Medical History  Diagnosis Date  . Restless leg syndrome   . Prostate cancer ([place])   . 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 Paroxysmal atrial fibrillation ([place]) 10/19/2014  . Paroxysmal atrial flutter ([place]) 10/19/2014  . Enterococcal bacteremia 10/19/2014  . 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 Protein-calorie malnutrition, severe ([place])   . 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 Coronary artery disease involving native coronary artery without angina pectoris 10/23/2014  . 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 Hepatitis   . 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/2014    23 mm [facility] Ease bovine pericardial bioprosthetic tissue valve with bovine pericardial patch enlargement of aortic root  . 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 S/P mitral valve repair 11/01/2014    Debridement of vegetation on anterior leaflet    Past Surgical History  Procedure Laterality Date  . Prostatectomy    . Cardiac  catheterization N/A 10/23/2014    Procedure: Right/Left Heart Cath and Coronary Angiography;  Surgeon: Alan Harp, MD;  Location: [place] [facility];  Service: Cardiovascular;  Laterality: N/A;  . Tee without cardioversion N/A 10/20/2014    Procedure: TRANSESOPHAGEAL ECHOCARDIOGRAM (TEE);  Surgeon:  Alan Hector, MD;  Location: [facility] ENDOSCOPY;  Service: Cardiovascular;  Laterality: N/A;  . Colonoscopy N/A 10/25/2014    Procedure: COLONOSCOPY;  Surgeon: Alan Silence, MD;  Location: [facility] ENDOSCOPY;  Service: Endoscopy;  Laterality: N/A;  . Mitral valve repair N/A 11/01/2014    Procedure: MITRAL VALVE REPAIR with no Ring;  Surgeon: Alan Alberts, MD;  Location: [place];  Service: Open Heart Surgery;  Laterality: N/A;  . Coronary artery bypass graft N/A 11/01/2014    Procedure: CORONARY ARTERY BYPASS GRAFTING (CABG) x 1 using internal mammary artery;  Surgeon: Alan Alberts, MD;  Location: [place];  Service: Open Heart Surgery;  Laterality: N/A;  . Tee without cardioversion N/A 11/01/2014    Procedure: TRANSESOPHAGEAL ECHOCARDIOGRAM (TEE);  Surgeon: Alan Alberts, MD;  Location: [place];  Service: Open Heart Surgery;  Laterality: N/A;  . Aortic valve replacement N/A 11/01/2014    Procedure: AORTIC VALVE REPLACEMENT (AVR);  Surgeon: Alan Alberts, MD;  Location: [place];  Service: Open Heart Surgery;  Laterality: N/A;  . Aortic root enlargement N/A 11/01/2014    Procedure: AORTIC ROOT ENLARGEMENT;  Surgeon: Alan Alberts, MD;  Location: [place];  Service: Open Heart Surgery;  Laterality: N/A;  . Cardioversion N/A 12/18/2014    Procedure: CARDIOVERSION;  Surgeon: Alan Casino, MD;  Location: [facility] ENDOSCOPY;  Service: Cardiovascular;  Laterality: N/A;     Current Outpatient Prescriptions  Medication Sig Dispense Refill  . amiodarone (PACERONE) 200 MG tablet Take 200 mg by mouth daily.    . furosemide (LASIX) 40 MG tablet Take 0.5 tablets (20 mg total) by mouth daily as needed. (Patient taking differently: Take 20 mg by mouth daily as needed for fluid. ) 30 tablet 0  . metoprolol (LOPRESSOR) 25 MG tablet Take 0.5 tablets (12.5 mg total) by mouth 2 (two) times daily. 30 tablet 3  . pramipexole (MIRAPEX) 1.5 MG tablet Take 1.5 mg by mouth at bedtime.    . traMADol (ULTRAM) 50 MG tablet Take 1-2 tablets  (50-100 mg total) by mouth every 4 (four) hours as needed for moderate pain. 30 tablet 0  . apixaban (ELIQUIS) 5 MG TABS tablet Take 1 tablet (5 mg total) by mouth 2 (two) times daily. 60 tablet 5   No current facility-administered medications for this visit.    Allergies:   Pollen extract    Social History:  The patient  reports that he has quit smoking. He does not have any smokeless tobacco history on file. He reports that he does not drink alcohol or use illicit drugs.   Family History:  The patient's family history is negative for CAD.    ROS:  Please see the history of present illness.    Otherwise, review of systems positive for none.   All other systems are reviewed and negative.    PHYSICAL EXAM: VS:  BP 100/74 mmHg  Pulse 96  Ht 6' (1.829 m)  Wt 160 lb 1.6 oz (72.621 kg)  BMI 21.71 kg/m2 , BMI Body mass index is 21.71 kg/(m^2).  General: Alert, oriented x3, no distress Head: no evidence of trauma, PERRL, EOMI, no exophtalmos or lid lag, no myxedema, no xanthelasma; normal ears, nose and  oropharynx Neck: normal jugular venous pulsations and no hepatojugular reflux; brisk carotid pulses without delay and no carotid bruits Chest: clear to auscultation, no signs of consolidation by percussion or palpation, normal fremitus, symmetrical and full respiratory excursions.  Well-healed sternotomy Cardiovascular: normal position and quality of the apical impulse, regular rhythm, normal first and second heart sounds, [id number]  Aortic systolic ejection murmur is early peaking, no diastolic murmurs, rubs or gallops Abdomen: no tenderness or distention, no masses by palpation, no abnormal pulsatility or arterial bruits, normal bowel sounds, no hepatosplenomegaly Extremities: no clubbing, cyanosis or edema; 2+ radial, ulnar and brachial pulses bilaterally; 2+ right femoral, posterior tibial and dorsalis pedis pulses; 2+ left femoral, posterior tibial and dorsalis pedis pulses; no subclavian or  femoral bruits Neurological: grossly nonfocal Psych: euthymic mood, full affect   EKG:  EKG is ordered today. The ekg ordered today demonstrates  Atrial flutter with high voltage course atrial ways that appeared to be consistent with typical counterclockwise flutter, possible voltage criteria for LVH, QS in leads V1-V2. It is hard to accurately measure the QT interval because of the very large flutter waves.   Recent Labs: 10/18/2014: TSH 1.708 11/12/2014: ALT 517* 11/25/2014: Magnesium 1.9 12/13/2014: BUN 12; Creatinine, Ser 1.31*; Hemoglobin 12.6*; Platelets 200; Potassium 4.1; Sodium 135    Lipid Panel No results found for: CHOL, TRIG, HDL, CHOLHDL, VLDL, LDLCALC, LDLDIRECT    Wt Readings from Last 3 Encounters:  01/11/15 160 lb 1.6 oz (72.621 kg)  01/09/15 156 lb 8 oz (70.988 kg)  12/22/14 167 lb 8 oz (75.978 kg)      Other studies Reviewed: Additional studies/ records that were reviewed today include:  Records from cardioversion, postop visit with Dr. Alvan Dame, endoscopy studies, most recent labs, Echo images from both preop and postop..   ASSESSMENT AND PLAN:  1.  Recurrent persistent atrial flutter. The flutter wave morphology suggests that he may have conventional counterclockwise right atrial flutter , although he obviously could also have flutter related to his left atriotomy scar or atrial appendage clip. Unfortunately, he has not been therapeutically anticoagulated for several weeks. I have recommended that we switch to a direct oral anticoagulant , stop warfarin and aspirin and attempt another cardioversion in 3 weeks. Amiodarone was poorly tolerated. We discussed the option for hospitalization to start sotalol or dofetilide but he has very little desire to go back to the hospital. If he again has early recurrence of arrhythmia following atrial flutter will refer to EP for consideration of ablation. Discussed the critical need to be completely compliant with anticoagulation in  the 3 months preceding cardioversion in the month following it.  2.  Aortic valve replacement with biological prosthesis and mitral valve repair following enterococcal endocarditis complicated by severe aortic insufficiency. He will need lifelong endocarditis prevention. Time to reevaluate his echocardiogram to see what his true "baseline" gradients will be across the aortic valve prosthesis and the repaired mitral valve. Note that no annuloplasty was performed  3.  Coronary artery disease, incidentally discovered , asymptomatic , S/P single vessel LIMA to LAD bypass surgery. He should probably be on a statin. Will reevaluate his lipid profile after he has time to fully recover from his endocarditis and surgery , since it is likely that cholesterol values will still be artifactually low at this time.  Note that in addition to the LAD stenosis he also had an ostial stenosis in the left circumflex-PDA (90%) and the ostium of the second obtuse marginal (70%),  Neither one  of these vessels were bypassed.    Current medicines are reviewed at length with the patient today.  The patient does not have concerns regarding medicines.  The following changes have been made:   Stop warfarin and aspirin. Start eliquis 5 mg twice a day  Labs/ tests ordered today include:  Orders Placed This Encounter  Procedures  . APTT  . Protime-INR  . CBC  . Comprehensive metabolic panel  . EKG 12-Lead  . ECHOCARDIOGRAM COMPLETE  . PR CARDIOVERSION, ELECTIVE;EXTERN     Patient Instructions  Your physician has recommended you make the following change in your medication:   STOP WARFARIN AND ASPIRIN  START ELIQUIS 5 MG TWICE DAILY  Your physician has requested that you have an echocardiogram AFTER THANKSGIVING [place]. Echocardiography is a painless test that uses sound waves to create images of your heart. It provides your doctor with information about the size and shape of your heart and how well  your heart's chambers and valves are working. This procedure takes approximately one hour. There are no restrictions for this procedure.  SCHEDULE AN EKG IN OUR OFFICE Monday February 05, 2015 TO VERIFY YOUR HEART RHYTHM.   Your physician has recommended that you have a Cardioversion (DCCV) Tuesday February 06, 2015. Electrical Cardioversion uses a jolt of electricity to your heart either through paddles or wired patches attached to your chest. This is a controlled, usually prescheduled, procedure. Defibrillation is done under light anesthesia in the hospital, and you usually go home the day of the procedure. This is done to get your heart back into a normal rhythm. You are not awake for the procedure. Please see the instruction sheet given to you today.       Mikael Spray, MD  01/12/2015 5:55 PM    Alan Klein, MD, [facility] [phone] office [phone] pager

## 2015-01-11 NOTE — Patient Instructions (Addendum)
Your physician has recommended you make the following change in your medication:   STOP WARFARIN AND ASPIRIN  START ELIQUIS 5 MG TWICE DAILY  Your physician has requested that you have an echocardiogram AFTER THANKSGIVING [place]. Echocardiography is a painless test that uses sound waves to create images of your heart. It provides your doctor with information about the size and shape of your heart and how well your heart's chambers and valves are working. This procedure takes approximately one hour. There are no restrictions for this procedure.  SCHEDULE AN EKG IN OUR OFFICE Monday February 05, 2015 TO VERIFY YOUR HEART RHYTHM.   Your physician has recommended that you have a Cardioversion (DCCV) Tuesday February 06, 2015. Electrical Cardioversion uses a jolt of electricity to your heart either through paddles or wired patches attached to your chest. This is a controlled, usually prescheduled, procedure. Defibrillation is done under light anesthesia in the hospital, and you usually go home the day of the procedure. This is done to get your heart back into a normal rhythm. You are not awake for the procedure. Please see the instruction sheet given to you today.

## 2015-01-12 ENCOUNTER — Other Ambulatory Visit: Payer: Self-pay | Admitting: *Deleted

## 2015-01-17 ENCOUNTER — Other Ambulatory Visit: Payer: Self-pay | Admitting: *Deleted

## 2015-01-17 DIAGNOSIS — I4892 Unspecified atrial flutter: Secondary | ICD-10-CM

## 2015-01-23 ENCOUNTER — Other Ambulatory Visit: Payer: Self-pay

## 2015-01-23 ENCOUNTER — Ambulatory Visit (HOSPITAL_COMMUNITY): Payer: BC Managed Care – PPO | Attending: Cardiovascular Disease

## 2015-01-23 DIAGNOSIS — Z954 Presence of other heart-valve replacement: Secondary | ICD-10-CM | POA: Diagnosis not present

## 2015-01-23 DIAGNOSIS — I4892 Unspecified atrial flutter: Secondary | ICD-10-CM | POA: Diagnosis present

## 2015-01-23 DIAGNOSIS — Z87891 Personal history of nicotine dependence: Secondary | ICD-10-CM | POA: Diagnosis not present

## 2015-01-23 DIAGNOSIS — I517 Cardiomegaly: Secondary | ICD-10-CM | POA: Diagnosis not present

## 2015-01-23 DIAGNOSIS — I071 Rheumatic tricuspid insufficiency: Secondary | ICD-10-CM | POA: Insufficient documentation

## 2015-01-23 DIAGNOSIS — I340 Nonrheumatic mitral (valve) insufficiency: Secondary | ICD-10-CM | POA: Insufficient documentation

## 2015-01-25 ENCOUNTER — Telehealth: Payer: Self-pay | Admitting: Cardiovascular Disease

## 2015-01-25 NOTE — Telephone Encounter (Signed)
Understood, obviously recommend that he stay well hydrated

## 2015-01-25 NOTE — Telephone Encounter (Signed)
Advice communicated to patient who verbalized understanding.

## 2015-01-25 NOTE — Telephone Encounter (Signed)
Spoke to Mr. Westenhaver, who states yesterday he was at work and felt nauseous and dizzy. EMS was called to scene by concerned coworkers.  EKG, VS done in field.  Pt states he did not go to hospital. He had not eaten that morning and had been drinking little d/t viral illness, determined this had precipitated event. He was encouraged to drink fluids, eat, etc.  (Notes EMS was surprised to find Atrial Flutter but pt informed them this wasn't concern.  He has a repeat cardioversion w/ Dr. Aundra Dubin on 12/13 and an EKG check in office here day prior.)  Pt reports better today, no concerns at all, but he was instructed to check in w/ [place] regarding this. Informed him as long as he is feeling OK, no concerns, but to call for any changes.  Confirmed upcoming EKG visit time/location.   Will route to Dr. Sallyanne Kuster.

## 2015-01-25 NOTE — Telephone Encounter (Signed)
Pt had a dizzy spell and EMT was called. They told him to call and talk to the nurse today.

## 2015-02-05 ENCOUNTER — Ambulatory Visit (INDEPENDENT_AMBULATORY_CARE_PROVIDER_SITE_OTHER): Payer: BC Managed Care – PPO | Admitting: *Deleted

## 2015-02-05 DIAGNOSIS — I4892 Unspecified atrial flutter: Secondary | ICD-10-CM | POA: Diagnosis not present

## 2015-02-05 MED ORDER — METOPROLOL TARTRATE 25 MG PO TABS: 12.5000 mg | ORAL_TABLET | Freq: Two times a day (BID) | ORAL | Status: DC

## 2015-02-05 NOTE — Patient Instructions (Addendum)
Return to Alan [name] for cardioversion with Dr. Aundra Dubin tomorrow as scheduled.

## 2015-02-05 NOTE — Progress Notes (Signed)
Patient presented for EKG pre-cardioversion. Interpretation by Tarri Fuller PA-C showed Atrial Flutter. Pt reports symptom of intermittent dizziness, none this morning. Pt to remain on schedule for cardioversion tomorrow.

## 2015-02-06 ENCOUNTER — Ambulatory Visit (HOSPITAL_COMMUNITY): Payer: BC Managed Care – PPO | Admitting: Certified Registered"

## 2015-02-06 ENCOUNTER — Ambulatory Visit (HOSPITAL_COMMUNITY)
Admission: RE | Admit: 2015-02-06 | Discharge: 2015-02-06 | Disposition: A | Discharge status: Home / Self Care | Payer: BC Managed Care – PPO | Source: Ambulatory Visit | Attending: Cardiology | Admitting: Cardiology

## 2015-02-06 ENCOUNTER — Encounter (HOSPITAL_COMMUNITY): Payer: Self-pay

## 2015-02-06 ENCOUNTER — Encounter (HOSPITAL_COMMUNITY)
Admission: RE | Disposition: A | Discharge status: Home / Self Care | Payer: Self-pay | Source: Ambulatory Visit | Attending: Cardiology

## 2015-02-06 DIAGNOSIS — Z8546 Personal history of malignant neoplasm of prostate: Secondary | ICD-10-CM | POA: Insufficient documentation

## 2015-02-06 DIAGNOSIS — Z955 Presence of coronary angioplasty implant and graft: Secondary | ICD-10-CM | POA: Insufficient documentation

## 2015-02-06 DIAGNOSIS — G2581 Restless legs syndrome: Secondary | ICD-10-CM | POA: Insufficient documentation

## 2015-02-06 DIAGNOSIS — Z8673 Personal history of transient ischemic attack (TIA), and cerebral infarction without residual deficits: Secondary | ICD-10-CM | POA: Diagnosis not present

## 2015-02-06 DIAGNOSIS — Z7901 Long term (current) use of anticoagulants: Secondary | ICD-10-CM | POA: Diagnosis not present

## 2015-02-06 DIAGNOSIS — I480 Paroxysmal atrial fibrillation: Secondary | ICD-10-CM | POA: Insufficient documentation

## 2015-02-06 DIAGNOSIS — Z952 Presence of prosthetic heart valve: Secondary | ICD-10-CM | POA: Diagnosis not present

## 2015-02-06 DIAGNOSIS — I2510 Atherosclerotic heart disease of native coronary artery without angina pectoris: Secondary | ICD-10-CM | POA: Insufficient documentation

## 2015-02-06 DIAGNOSIS — Z79899 Other long term (current) drug therapy: Secondary | ICD-10-CM | POA: Insufficient documentation

## 2015-02-06 DIAGNOSIS — I4892 Unspecified atrial flutter: Secondary | ICD-10-CM | POA: Diagnosis not present

## 2015-02-06 HISTORY — PX: CARDIOVERSION: SHX1299

## 2015-02-06 SURGERY — CARDIOVERSION
Anesthesia: General

## 2015-02-06 MED ORDER — PHENYLEPHRINE HCL 10 MG/ML IJ SOLN
INTRAMUSCULAR | Status: DC | PRN
  Administered 2015-02-06: 80 ug via INTRAVENOUS
  Administered 2015-02-06: 40 ug via INTRAVENOUS
  Administered 2015-02-06 (×3): 80 ug via INTRAVENOUS

## 2015-02-06 MED ORDER — PROPOFOL 10 MG/ML IV BOLUS
INTRAVENOUS | Status: DC | PRN
  Administered 2015-02-06: 100 mg via INTRAVENOUS

## 2015-02-06 MED ORDER — SODIUM CHLORIDE 0.9 % IV SOLN
INTRAVENOUS | Status: DC
  Administered 2015-02-06: 14:00:00 via INTRAVENOUS

## 2015-02-06 MED ORDER — LIDOCAINE HCL (CARDIAC) 20 MG/ML IV SOLN
INTRAVENOUS | Status: DC | PRN
  Administered 2015-02-06: 50 mg via INTRAVENOUS

## 2015-02-06 NOTE — Transfer of Care (Signed)
Immediate Anesthesia Transfer of Care Note  Patient: Alan [name]  Procedure(s) Performed: Procedure(s): CARDIOVERSION (N/A)  Patient Location: PACU  Anesthesia Type:General  Level of Consciousness: awake, alert , oriented and patient cooperative  Airway & Oxygen Therapy: Patient Spontanous Breathing and Patient connected to nasal cannula oxygen  Post-op Assessment: Report given to RN, Post -op Vital signs reviewed and stable and Patient moving all extremities  Post vital signs: Reviewed and stable  Last Vitals:  Filed Vitals:   02/06/15 1449 02/06/15 1450  BP:  77/57  Pulse: 64 63  Resp: 18 16    Complications: No apparent anesthesia complications

## 2015-02-06 NOTE — Interval H&P Note (Signed)
History and Physical Interval Note:  02/06/2015 2:21 PM  [place]  has presented today for surgery, with the diagnosis of Atrial flutter  The various methods of treatment have been discussed with the patient and family. After consideration of risks, benefits and other options for treatment, the patient has consented to  Procedure(s): CARDIOVERSION (N/A) as a surgical intervention .  The patient's history has been reviewed, patient examined, no change in status, stable for surgery.  I have reviewed the patient's chart and labs.  Questions were answered to the patient's satisfaction.     Bensimhon, Quillian Quince

## 2015-02-06 NOTE — Anesthesia Preprocedure Evaluation (Signed)
Anesthesia Evaluation  Patient identified by MRN, date of birth, ID band Patient awake    Reviewed: Allergy & Precautions, NPO status , Patient's Chart, lab work & pertinent test results  Airway Mallampati: II  TM Distance: >3 FB Neck ROM: Full    Dental no notable dental hx.    Pulmonary neg pulmonary ROS, former smoker,    Pulmonary exam normal breath sounds clear to auscultation       Cardiovascular + CAD, + CABG and + Peripheral Vascular Disease  + dysrhythmias Atrial Fibrillation  Rhythm:Irregular Rate:Normal + Systolic murmurs  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/2014 23 mm [facility] Ease bovine pericardial bioprosthetic tissue valve with bovine pericardial patch enlargement of aortic root     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 LIMA to LAD S/P mitral valve repair 11/01/2014 Debridement of vegetation on anterior leaflet     Neuro/Psych PSYCHIATRIC DISORDERS CVA    GI/Hepatic negative GI ROS, (+) Hepatitis -, Unspecified  Endo/Other  negative endocrine ROS  Renal/GU negative Renal ROS  negative genitourinary   Musculoskeletal negative musculoskeletal ROS (+)   Abdominal   Peds negative pediatric ROS (+)  Hematology  (+) anemia ,   Anesthesia Other Findings   Reproductive/Obstetrics negative OB ROS                             Anesthesia Physical  Anesthesia Plan  ASA: III  Anesthesia Plan: General   Post-op Pain Management:    Induction: Intravenous  Airway Management Planned: Mask  Additional Equipment:   Intra-op Plan:   Post-operative Plan:   Informed Consent: I have reviewed the patients History and Physical, chart, labs and discussed the procedure including the risks, benefits and alternatives for the proposed anesthesia with the patient or authorized representative who has indicated his/her understanding and acceptance.   Dental advisory given  Plan  Discussed with: CRNA and Surgeon  Anesthesia Plan Comments:         Anesthesia Quick Evaluation

## 2015-02-06 NOTE — Discharge Instructions (Signed)
Electrical Cardioversion, Care After °Refer to this sheet in the next few weeks. These instructions provide you with information on caring for yourself after your procedure. Your health care provider may also give you more specific instructions. Your treatment has been planned according to current medical practices, but problems sometimes occur. Call your health care provider if you have any problems or questions after your procedure. °WHAT TO EXPECT AFTER THE PROCEDURE °After your procedure, it is typical to have the following sensations: °· Some redness on the skin where the shocks were delivered. If this is tender, a sunburn lotion or hydrocortisone cream may help. °· Possible return of an abnormal heart rhythm within hours or days after the procedure. °HOME CARE INSTRUCTIONS °· Take medicines only as directed by your health care provider. Be sure you understand how and when to take your medicine. °· Learn how to feel your pulse and check it often. °· Limit your activity for 48 hours after the procedure or as directed by your health care provider. °· Avoid or minimize caffeine and other stimulants as directed by your health care provider. °SEEK MEDICAL CARE IF: °· You feel like your heart is beating too fast or your pulse is not regular. °· You have any questions about your medicines. °· You have bleeding that will not stop. °SEEK IMMEDIATE MEDICAL CARE IF: °· You are dizzy or feel faint. °· It is hard to breathe or you feel short of breath. °· There is a change in discomfort in your chest. °· Your speech is slurred or you have trouble moving an arm or leg on one side of your body. °· You get a serious muscle cramp that does not go away. °· Your fingers or toes turn cold or blue. °  °This information is not intended to replace advice given to you by your health care provider. Make sure you discuss any questions you have with your health care provider. °  °Document Released: 12/01/2012 Document Revised: 03/03/2014  Document Reviewed: 12/01/2012 °Elsevier Interactive Patient Education ©2016 [employer] ° °

## 2015-02-06 NOTE — Addendum Note (Signed)
Addended by: Diana Eves on: 02/06/2015 11:37 AM   Modules accepted: Orders

## 2015-02-06 NOTE — CV Procedure (Signed)
     DIRECT CURRENT CARDIOVERSION  NAME:  Alan [name]   MRN: [id number] DOB:  1950/09/13   ADMIT DATE: 02/06/2015   INDICATIONS: Atrial flutter   PROCEDURE:   Informed consent was obtained prior to the procedure. The risks, benefits and alternatives for the procedure were discussed and the patient comprehended these risks. Once an appropriate time out was taken, the patient had the defibrillator pads placed in the anterior and posterior position. The patient then underwent sedation by the anesthesia service with 100mg  IV propofol. Once an appropriate level of sedation was achieved, the patient received a single biphasic, synchronized 150J shock with prompt conversion to sinus rhythm. Post-cardioversion the patient developed hypotension and was treated successfully with phenylephrine and IVF.  Shmiel Morton,MD 2:31 PM

## 2015-02-06 NOTE — H&P (View-Only) (Signed)
Patient ID: Alan [name], male   DOB: [birth year], 64 y.o.   MRN: [id number]     Cardiology Office Note   Date:  01/12/2015   ID:  Alan [name], DOB June 22, 1950, MRN [id number]  PCP:  [name], MD  Cardiologist:   Sanda Klein, MD   Chief Complaint  Patient presents with  . ROV 1 month    patient reports some chest discomfort when doing yard work - "external bone" points to sternum, slight swelling in legs/ankles - has since resolved.      History of Present Illness: Alan [name] is a 64 y.o. 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   he presented with malaise, fever and was found to have septic emboli in his brain and spleen, severe aortic insufficiency and vegetations on both the aortic and mitral valves.  Surgery was performed on September 7. On October 24 he underwent successful elective cardioversion for  Atrial flutter.  There has been difficulty maintaining appropriate levels of anticoagulation with warfarin since that time. Amiodarone was discontinued due to liver test abnormalities. Has not had problems with congestive heart failure. Left ventricular systolic function is normal  at preop and postop testing.    He has completed a total of 8 weeks of intravenous antibiotic therapy , including 6 weeks following his surgery. He completed antibiotics about 5 weeks ago and his PICC line has been removed.  Despite upper and lower endoscopy and dental evaluation and no clear source of his enterococcal  bacteremia has been identified.   He has improving appetite, increasing energy and is gaining weight. He continues to have dysgeusia. He has been afebrile. He complains of some soreness at the sternotomy site that is clearly associated with motion. He does not have angina or pleurisy.  He has returned to work part-time at [employer]. He does not want to go to cardiac rehabilitation.  Past Medical History  Diagnosis Date  . Restless leg syndrome   . Prostate cancer ([place])   . 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 Paroxysmal atrial fibrillation ([place]) 10/19/2014  . Paroxysmal atrial flutter ([place]) 10/19/2014  . Enterococcal bacteremia 10/19/2014  . 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 Protein-calorie malnutrition, severe ([place])   . 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 Coronary artery disease involving native coronary artery without angina pectoris 10/23/2014  . 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 Hepatitis   . 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/2014    23 mm [facility] Ease bovine pericardial bioprosthetic tissue valve with bovine pericardial patch enlargement of aortic root  . 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 S/P mitral valve repair 11/01/2014    Debridement of vegetation on anterior leaflet    Past Surgical History  Procedure Laterality Date  . Prostatectomy    . Cardiac  catheterization N/A 10/23/2014    Procedure: Right/Left Heart Cath and Coronary Angiography;  Surgeon: Lorretta Harp, MD;  Location: [place] [facility];  Service: Cardiovascular;  Laterality: N/A;  . Tee without cardioversion N/A 10/20/2014    Procedure: TRANSESOPHAGEAL ECHOCARDIOGRAM (TEE);  Surgeon:  Josue Hector, MD;  Location: [facility] ENDOSCOPY;  Service: Cardiovascular;  Laterality: N/A;  . Colonoscopy N/A 10/25/2014    Procedure: COLONOSCOPY;  Surgeon: Arta Silence, MD;  Location: [facility] ENDOSCOPY;  Service: Endoscopy;  Laterality: N/A;  . Mitral valve repair N/A 11/01/2014    Procedure: MITRAL VALVE REPAIR with no Ring;  Surgeon: Rexene Alberts, MD;  Location: [place];  Service: Open Heart Surgery;  Laterality: N/A;  . Coronary artery bypass graft N/A 11/01/2014    Procedure: CORONARY ARTERY BYPASS GRAFTING (CABG) x 1 using internal mammary artery;  Surgeon: Rexene Alberts, MD;  Location: [place];  Service: Open Heart Surgery;  Laterality: N/A;  . Tee without cardioversion N/A 11/01/2014    Procedure: TRANSESOPHAGEAL ECHOCARDIOGRAM (TEE);  Surgeon: Rexene Alberts, MD;  Location: [place];  Service: Open Heart Surgery;  Laterality: N/A;  . Aortic valve replacement N/A 11/01/2014    Procedure: AORTIC VALVE REPLACEMENT (AVR);  Surgeon: Rexene Alberts, MD;  Location: [place];  Service: Open Heart Surgery;  Laterality: N/A;  . Aortic root enlargement N/A 11/01/2014    Procedure: AORTIC ROOT ENLARGEMENT;  Surgeon: Rexene Alberts, MD;  Location: [place];  Service: Open Heart Surgery;  Laterality: N/A;  . Cardioversion N/A 12/18/2014    Procedure: CARDIOVERSION;  Surgeon: Pixie Casino, MD;  Location: [facility] ENDOSCOPY;  Service: Cardiovascular;  Laterality: N/A;     Current Outpatient Prescriptions  Medication Sig Dispense Refill  . amiodarone (PACERONE) 200 MG tablet Take 200 mg by mouth daily.    . furosemide (LASIX) 40 MG tablet Take 0.5 tablets (20 mg total) by mouth daily as needed. (Patient taking differently: Take 20 mg by mouth daily as needed for fluid. ) 30 tablet 0  . metoprolol (LOPRESSOR) 25 MG tablet Take 0.5 tablets (12.5 mg total) by mouth 2 (two) times daily. 30 tablet 3  . pramipexole (MIRAPEX) 1.5 MG tablet Take 1.5 mg by mouth at bedtime.    . traMADol (ULTRAM) 50 MG tablet Take 1-2 tablets  (50-100 mg total) by mouth every 4 (four) hours as needed for moderate pain. 30 tablet 0  . apixaban (ELIQUIS) 5 MG TABS tablet Take 1 tablet (5 mg total) by mouth 2 (two) times daily. 60 tablet 5   No current facility-administered medications for this visit.    Allergies:   Pollen extract    Social History:  The patient  reports that he has quit smoking. He does not have any smokeless tobacco history on file. He reports that he does not drink alcohol or use illicit drugs.   Family History:  The patient's family history is negative for CAD.    ROS:  Please see the history of present illness.    Otherwise, review of systems positive for none.   All other systems are reviewed and negative.    PHYSICAL EXAM: VS:  BP 100/74 mmHg  Pulse 96  Ht 6' (1.829 m)  Wt 160 lb 1.6 oz (72.621 kg)  BMI 21.71 kg/m2 , BMI Body mass index is 21.71 kg/(m^2).  General: Alert, oriented x3, no distress Head: no evidence of trauma, PERRL, EOMI, no exophtalmos or lid lag, no myxedema, no xanthelasma; normal ears, nose and  oropharynx Neck: normal jugular venous pulsations and no hepatojugular reflux; brisk carotid pulses without delay and no carotid bruits Chest: clear to auscultation, no signs of consolidation by percussion or palpation, normal fremitus, symmetrical and full respiratory excursions.  Well-healed sternotomy Cardiovascular: normal position and quality of the apical impulse, regular rhythm, normal first and second heart sounds, [id number]  Aortic systolic ejection murmur is early peaking, no diastolic murmurs, rubs or gallops Abdomen: no tenderness or distention, no masses by palpation, no abnormal pulsatility or arterial bruits, normal bowel sounds, no hepatosplenomegaly Extremities: no clubbing, cyanosis or edema; 2+ radial, ulnar and brachial pulses bilaterally; 2+ right femoral, posterior tibial and dorsalis pedis pulses; 2+ left femoral, posterior tibial and dorsalis pedis pulses; no subclavian or  femoral bruits Neurological: grossly nonfocal Psych: euthymic mood, full affect   EKG:  EKG is ordered today. The ekg ordered today demonstrates  Atrial flutter with high voltage course atrial ways that appeared to be consistent with typical counterclockwise flutter, possible voltage criteria for LVH, QS in leads V1-V2. It is hard to accurately measure the QT interval because of the very large flutter waves.   Recent Labs: 10/18/2014: TSH 1.708 11/12/2014: ALT 517* 11/25/2014: Magnesium 1.9 12/13/2014: BUN 12; Creatinine, Ser 1.31*; Hemoglobin 12.6*; Platelets 200; Potassium 4.1; Sodium 135    Lipid Panel No results found for: CHOL, TRIG, HDL, CHOLHDL, VLDL, LDLCALC, LDLDIRECT    Wt Readings from Last 3 Encounters:  01/11/15 160 lb 1.6 oz (72.621 kg)  01/09/15 156 lb 8 oz (70.988 kg)  12/22/14 167 lb 8 oz (75.978 kg)      Other studies Reviewed: Additional studies/ records that were reviewed today include:  Records from cardioversion, postop visit with Dr. Alvan Dame, endoscopy studies, most recent labs, Echo images from both preop and postop..   ASSESSMENT AND PLAN:  1.  Recurrent persistent atrial flutter. The flutter wave morphology suggests that he may have conventional counterclockwise right atrial flutter , although he obviously could also have flutter related to his left atriotomy scar or atrial appendage clip. Unfortunately, he has not been therapeutically anticoagulated for several weeks. I have recommended that we switch to a direct oral anticoagulant , stop warfarin and aspirin and attempt another cardioversion in 3 weeks. Amiodarone was poorly tolerated. We discussed the option for hospitalization to start sotalol or dofetilide but he has very little desire to go back to the hospital. If he again has early recurrence of arrhythmia following atrial flutter will refer to EP for consideration of ablation. Discussed the critical need to be completely compliant with anticoagulation in  the 3 months preceding cardioversion in the month following it.  2.  Aortic valve replacement with biological prosthesis and mitral valve repair following enterococcal endocarditis complicated by severe aortic insufficiency. He will need lifelong endocarditis prevention. Time to reevaluate his echocardiogram to see what his true "baseline" gradients will be across the aortic valve prosthesis and the repaired mitral valve. Note that no annuloplasty was performed  3.  Coronary artery disease, incidentally discovered , asymptomatic , S/P single vessel LIMA to LAD bypass surgery. He should probably be on a statin. Will reevaluate his lipid profile after he has time to fully recover from his endocarditis and surgery , since it is likely that cholesterol values will still be artifactually low at this time.  Note that in addition to the LAD stenosis he also had an ostial stenosis in the left circumflex-PDA (90%) and the ostium of the second obtuse marginal (70%),  Neither one  of these vessels were bypassed.    Current medicines are reviewed at length with the patient today.  The patient does not have concerns regarding medicines.  The following changes have been made:   Stop warfarin and aspirin. Start eliquis 5 mg twice a day  Labs/ tests ordered today include:  Orders Placed This Encounter  Procedures  . APTT  . Protime-INR  . CBC  . Comprehensive metabolic panel  . EKG 12-Lead  . ECHOCARDIOGRAM COMPLETE  . PR CARDIOVERSION, ELECTIVE;EXTERN     Patient Instructions  Your physician has recommended you make the following change in your medication:   STOP WARFARIN AND ASPIRIN  START ELIQUIS 5 MG TWICE DAILY  Your physician has requested that you have an echocardiogram AFTER THANKSGIVING [place]. Echocardiography is a painless test that uses sound waves to create images of your heart. It provides your doctor with information about the size and shape of your heart and how well  your heart's chambers and valves are working. This procedure takes approximately one hour. There are no restrictions for this procedure.  SCHEDULE AN EKG IN OUR OFFICE Monday February 05, 2015 TO VERIFY YOUR HEART RHYTHM.   Your physician has recommended that you have a Cardioversion (DCCV) Tuesday February 06, 2015. Electrical Cardioversion uses a jolt of electricity to your heart either through paddles or wired patches attached to your chest. This is a controlled, usually prescheduled, procedure. Defibrillation is done under light anesthesia in the hospital, and you usually go home the day of the procedure. This is done to get your heart back into a normal rhythm. You are not awake for the procedure. Please see the instruction sheet given to you today.       Mikael Spray, MD  01/12/2015 5:55 PM    Sanda Klein, MD, [facility] [phone] office [phone] pager

## 2015-02-07 ENCOUNTER — Encounter (HOSPITAL_COMMUNITY): Payer: Self-pay | Admitting: Cardiology

## 2015-02-08 NOTE — Anesthesia Postprocedure Evaluation (Signed)
Anesthesia Post Note  Patient: Alan [name]  Procedure(s) Performed: Procedure(s) (LRB): CARDIOVERSION (N/A)  Patient location during evaluation: PACU Anesthesia Type: General Level of consciousness: sedated and patient cooperative Pain management: pain level controlled Vital Signs Assessment: post-procedure vital signs reviewed and stable Respiratory status: spontaneous breathing Cardiovascular status: stable Anesthetic complications: no    Last Vitals:  Filed Vitals:   02/06/15 1545 02/06/15 1548  BP: 113/79 113/79  Pulse: 70   Resp: 17 14    Last Pain: There were no vitals filed for this visit.               Nolon Nations

## 2015-02-12 ENCOUNTER — Ambulatory Visit (INDEPENDENT_AMBULATORY_CARE_PROVIDER_SITE_OTHER): Payer: BC Managed Care – PPO | Admitting: Thoracic Surgery (Cardiothoracic Vascular Surgery)

## 2015-02-12 ENCOUNTER — Encounter: Payer: Self-pay | Admitting: Thoracic Surgery (Cardiothoracic Vascular Surgery)

## 2015-02-12 VITALS — BP 101/70 | HR 58 | Temp 97.0°F | Resp 20 | Ht 72.0 in | Wt 155.0 lb

## 2015-02-12 DIAGNOSIS — Z953 Presence of xenogenic heart valve: Secondary | ICD-10-CM

## 2015-02-12 DIAGNOSIS — I358 Other nonrheumatic aortic valve disorders: Secondary | ICD-10-CM

## 2015-02-12 DIAGNOSIS — I340 Nonrheumatic mitral (valve) insufficiency: Secondary | ICD-10-CM | POA: Diagnosis not present

## 2015-02-12 DIAGNOSIS — Z954 Presence of other heart-valve replacement: Secondary | ICD-10-CM

## 2015-02-12 DIAGNOSIS — Z951 Presence of aortocoronary bypass graft: Secondary | ICD-10-CM

## 2015-02-12 DIAGNOSIS — I4819 Other persistent atrial fibrillation: Secondary | ICD-10-CM

## 2015-02-12 DIAGNOSIS — I481 Persistent atrial fibrillation: Secondary | ICD-10-CM

## 2015-02-12 DIAGNOSIS — I059 Rheumatic mitral valve disease, unspecified: Secondary | ICD-10-CM

## 2015-02-12 DIAGNOSIS — I2510 Atherosclerotic heart disease of native coronary artery without angina pectoris: Secondary | ICD-10-CM

## 2015-02-12 DIAGNOSIS — Z9889 Other specified postprocedural states: Secondary | ICD-10-CM

## 2015-02-12 DIAGNOSIS — I058 Other rheumatic mitral valve diseases: Secondary | ICD-10-CM | POA: Diagnosis not present

## 2015-02-12 DIAGNOSIS — B999 Unspecified infectious disease: Secondary | ICD-10-CM

## 2015-02-12 NOTE — Patient Instructions (Addendum)
Continue all previous medications without any changes at this time  Schedule a follow up appointment with Dr. Sallyanne Kuster as soon as practical to review your medications  You may resume unrestricted physical activity without any particular limitations at this time, although I would refrain from driving an automobile if you are having dizzy spells.  You are encouraged to enroll and participate in the outpatient cardiac rehab program beginning as soon as practical once cleared by Dr. Sallyanne Kuster.   Endocarditis is a potentially serious infection of heart valves or inside lining of the heart.  It occurs more commonly in patients with diseased heart valves (such as patient's with aortic or mitral valve disease) and in patients who have undergone heart valve repair or replacement.  Certain surgical and dental procedures may put you at risk, such as dental cleaning, other dental procedures, or any surgery involving the respiratory, urinary, gastrointestinal tract, gallbladder or prostate gland.   To minimize your chances for develooping endocarditis, maintain good oral health and seek prompt medical attention for any infections involving the mouth, teeth, gums, skin or urinary tract.    Always notify your doctor or dentist about your underlying heart valve condition before having any invasive procedures. You will need to take antibiotics before certain procedures, including all routine dental cleanings or other dental procedures.  Your cardiologist or dentist should prescribe these antibiotics for you to be taken ahead of time.

## 2015-02-12 NOTE — Progress Notes (Signed)
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 Preoperative evaluation was notable for the demonstration of subacute lacunar infarcts to the brain consistent with septic emboli that were subclinical as well as a large infarct in the spleen that was associated with left-sided abdominal discomfort. The patient additionally underwent consultation with Dental Medicine and GI medicine prior to surgery. Thorough dental exam, upper and lower endoscopy failed to identify any pathologic source of enterococcus within the GI tract. Postoperatively the patient developed paroxysmal atrial fibrillation. He was initially treated with amiodarone but this was discontinued because of elevated liver enzymes. He was discharged from the hospital in rate-controlled atrial fibrillation on warfarin anticoagulation. In addition, the patient has remained on intravenous Rocephin and ampicillin with plans to complete a full 6 week course of antibiotics postoperatively. He was last seen in follow-up in our office on 12/11/2014 at which time he remained in rate controlled atrial flutter.  He underwent selective cardioversion on 12/18/2015 but was back in atrial flutter when he returned for follow-up to see Dr. Sallyanne [name] on 01/11/2015.  He was taken off of  warfarin and aspirin at that time and restarted on amiodarone. Follow-up transthoracic echocardiogram revealed normal left ventricular systolic function with severe left atrial enlargement. The bioprosthetic tissue valve in the aortic position was functioning normally with mean transvalvular gradient estimated 8 mmHg. There was mild residual mitral regurgitation.  He recently underwent repeat DC cardioversion on 02/06/2015.  He returns to our office for routine follow-up today.  The patient states that he feels okay but he complains of nausea and intermittent dizzy spells. He feels as though he has poor energy. He denies any shortness of breath. He has not had syncopal episodes. He has not had palpitations. He complains that he has had some problems being able to concentrate at work.   Current Outpatient Prescriptions  Medication Sig Dispense Refill  . amiodarone (PACERONE) 200 MG tablet Take 200 mg by mouth daily.    Marland Kitchen apixaban (ELIQUIS) 5 MG TABS tablet Take 1 tablet (5 mg total) by mouth 2 (two) times daily. 60 tablet 5  . aspirin-acetaminophen-caffeine (EXCEDRIN MIGRAINE) 250-250-65 MG tablet Take 2 tablets by mouth daily as needed for migraine.    . Flaxseed, Linseed, (FLAXSEED OIL PO) Take 1 capsule by mouth 2 (two) times daily.    . furosemide (LASIX) 40 MG tablet Take 0.5 tablets (20 mg total) by mouth daily as needed. 30 tablet 0  . metoprolol tartrate (LOPRESSOR) 25 MG tablet Take 0.5 tablets (12.5 mg total) by mouth 2 (two) times daily. 30 tablet 3  . Multiple Vitamins-Minerals (PRESERVISION AREDS 2) CAPS Take 1 capsule by mouth 2 (two) times daily.    . pramipexole (MIRAPEX) 1.5 MG tablet Take 1.5 mg by mouth at bedtime.    . traMADol (ULTRAM) 50 MG tablet Take  1-2 tablets (50-100 mg total) by mouth every 4 (four) hours as needed for moderate pain. 30 tablet 0   No current facility-administered medications for this visit.      Physical Exam:   BP 101/70 mmHg  Pulse 58  Temp(Src)  97 F (36.1 C) (Oral)  Resp 20  Ht 6' (1.829 m)  Wt 155 lb (70.308 kg)  BMI 21.02 kg/m2  SpO2 97%  General:  Well-appearing  Chest:   Clear to auscultation  CV:   Regular rate and rhythm without murmur  Incisions:  Healed nicely, sternum is stable  Abdomen:  Soft and nontender  Extremities:  Warm and well-perfused  Diagnostic Tests:  Transthoracic Echocardiography  Patient:  Alan, [name] MR #:    [id number] Study Date: 01/23/2015 Gender:   M Age:    64 Height:   182.9 cm Weight:   72.6 kg BSA:    1.92 m^2 Pt. Status: Room:  SONOGRAPHER Alan [name], Alan Croitoru, MD [facility]   Alan Klein, MD REFERRING  Alan Klein, MD PERFORMING  [facility], Outpatient  cc:  ------------------------------------------------------------------- LV EF: 60% -  65%  ------------------------------------------------------------------- Indications:   Atrial flutter (I48.92).  ------------------------------------------------------------------- History:  PMH:  Coronary artery disease. Endocarditis. Risk factors: PVD. Sepsis. Encephalopathy. Prostate cancer. Restless leg syndrome. Anemia. Bacteremia. Leukocytosis. Protein-calorie malnutrition. Former tobacco use.  ------------------------------------------------------------------- Study Conclusions  - Left ventricle: The cavity size was normal. There was severe concentric hypertrophy. Systolic function was normal. The estimated ejection fraction was in the range of 60% to 65%. Wall motion was normal; there were no regional wall motion abnormalities. The study is not technically sufficient to allow evaluation of LV diastolic function. - Aortic valve: A bioprosthesis was present. The sewing ring appeared normal. Transvalvular velocity was within the normal range. There was no stenosis. Peak velocity (S): 197 cm/s. Mean gradient (S): 8 mm Hg. - Mitral  valve: Prior procedures included surgical repair, though no surgical ring is appreciated. The findings are consistent with mild stenosis. There was mild regurgitation. Mean gradient (D): 5 mm Hg. - Left atrium: The atrium was severely dilated. - Right ventricle: The cavity size was normal. Wall thickness was normal. Systolic function was moderately reduced. - Right atrium: The atrium was severely dilated. - Atrial septum: No defect or patent foramen ovale was identified. - Tricuspid valve: There was mild regurgitation. - Pulmonary arteries: PA peak pressure: 37 mm Hg (S). - Inferior vena cava: The vessel was dilated. The respirophasic diameter changes were blunted (< 50%), consistent with elevated central venous pressure.  ------------------------------------------------------------------- Labs, prior tests, procedures, and surgery: Transthoracic echocardiography (11/09/2014).   EF was 70%. Aortic valve: peak gradient of 19 mm Hg and mean gradient of 7 mm Hg.  Valve surgery.   Mitral valve repair. Valve surgery.   Aortic valve replacement with a bioprosthetic valve. Transthoracic echocardiography. M-mode, complete 2D, spectral Doppler, and color Doppler. Birthdate: Patient birthdate: [birth year]. Age: Patient is 64 yr old. Sex: Gender: male. BMI: 21.7 kg/m^2. Blood pressure:   100/74 Patient status: Outpatient. Study date: Study date: 01/23/2015. Study time: 04:18 PM. Location: [facility]  -------------------------------------------------------------------  ------------------------------------------------------------------- Left ventricle: The cavity size was normal. There was severe concentric hypertrophy. Systolic function was normal. The estimated ejection fraction was in the range of 60% to 65%. Wall motion was normal; there were no regional wall motion abnormalities. The study is not technically sufficient to allow evaluation of LV  diastolic function.  ------------------------------------------------------------------- Aortic valve: A  bioprosthesis was present. The sewing ring appeared normal. Mobility was not restricted. Doppler: Transvalvular velocity was within the normal range. There was no stenosis. There was no regurgitation.  VTI ratio of LVOT to aortic valve: 0.74. Peak velocity ratio of LVOT to aortic valve: 0.62. Mean velocity ratio of LVOT to aortic valve: 0.7.  Mean gradient (S): 8 mm Hg. Peak gradient (S): 16 mm Hg.  ------------------------------------------------------------------- Aorta: Aortic root: The aortic root was normal in size.  ------------------------------------------------------------------- Mitral valve: Prior procedures included surgical repair, though no surgical ring is appreciated. Mobility was not restricted. Doppler:  The findings are consistent with mild stenosis.  There was mild regurgitation.  Valve area by pressure half-time: 5.5 cm^2. Indexed valve area by pressure half-time: 2.87 cm^2/m^2. Mean gradient (D): 5 mm Hg.  ------------------------------------------------------------------- Left atrium: The atrium was severely dilated.  ------------------------------------------------------------------- Atrial septum: No defect or patent foramen ovale was identified.  ------------------------------------------------------------------- Right ventricle: The cavity size was normal. Wall thickness was normal. Systolic function was moderately reduced.  ------------------------------------------------------------------- Pulmonic valve:  Structurally normal valve.  Cusp separation was normal. Doppler: Transvalvular velocity was within the normal range. There was no evidence for stenosis. There was no regurgitation.  ------------------------------------------------------------------- Tricuspid valve:  Structurally normal valve.  Doppler: Transvalvular  velocity was within the normal range. There was mild regurgitation.  ------------------------------------------------------------------- Pulmonary artery:  The main pulmonary artery was normal-sized. Systolic pressure was within the normal range.  ------------------------------------------------------------------- Right atrium: The atrium was severely dilated.  ------------------------------------------------------------------- Pericardium: There was no pericardial effusion.  ------------------------------------------------------------------- Systemic veins: Inferior vena cava: The vessel was dilated. The respirophasic diameter changes were blunted (< 50%), consistent with elevated central venous pressure.  ------------------------------------------------------------------- Measurements  Left ventricle             Value     Reference LV ID, ED, PLAX chordal    (L)   35.4 mm    43 - 52 LV ID, ES, PLAX chordal    (L)   20.5 mm    23 - 38 LV fx shortening, PLAX chordal     42  %    >=29 LV PW thickness, ED          16.02 mm    --------- IVS/LV PW ratio, ED          1.14      <=1.3  Ventricular septum           Value     Reference IVS thickness, ED           18.31 mm    ---------  LVOT                  Value     Reference LVOT peak velocity, S         122  cm/s   --------- LVOT mean velocity, S         86.6 cm/s   --------- LVOT VTI, S              21.4 cm    --------- LVOT peak gradient, S         6   mm Hg  ---------  Aortic valve              Value     Reference Aortic valve peak velocity, S     197  cm/s   --------- Aortic valve mean velocity, S     123  cm/s   --------- Aortic valve VTI, S  29.1 cm    --------- Aortic  mean gradient, S        8   mm Hg  --------- Aortic peak gradient, S        16  mm Hg  --------- VTI ratio, LVOT/AV           0.74      --------- Velocity ratio, peak, LVOT/AV     0.62      --------- Velocity ratio, mean, LVOT/AV     0.7      ---------  Aorta                 Value     Reference Aortic root ID, ED           25  mm    ---------  Left atrium              Value     Reference LA ID, A-P, ES             47  mm    --------- LA ID/bsa, A-P         (H)   2.45 cm/m^2  <=2.2 LA volume, S              72  ml    --------- LA volume/bsa, S            37.6 ml/m^2  --------- LA volume, ES, 1-p A4C         78.5 ml    --------- LA volume/bsa, ES, 1-p A4C       41  ml/m^2  --------- LA volume, ES, 1-p A2C         60.9 ml    --------- LA volume/bsa, ES, 1-p A2C       31.8 ml/m^2  ---------  Mitral valve              Value     Reference Mitral mean velocity, D        92.3 cm/s   --------- Mitral pressure half-time       40  ms    --------- Mitral mean gradient, D        5   mm Hg  --------- Mitral valve area, PHT, DP       5.5  cm^2   --------- Mitral valve area/bsa, PHT, DP     2.87 cm^2/m^2 --------- Mitral annulus VTI, D         27.5 cm    ---------  Pulmonary arteries           Value     Reference PA pressure, S, DP       (H)   37  mm Hg  <=30  Tricuspid valve            Value     Reference Tricuspid regurg peak velocity     236  cm/s   --------- Tricuspid peak RV-RA gradient     22  mm Hg  ---------  Systemic veins             Value     Reference Estimated CVP              15  mm Hg  ---------  Right ventricle            Value     Reference RV pressure, S, DP       (H)   37  mm Hg  <=30 RV s&', lateral, S  8.51 cm/s   ---------  Legend: (L) and (H) mark values outside specified reference range.  ------------------------------------------------------------------- Prepared and Electronically Authenticated by  Skeet Latch, MD 2016-11-29T17:39:45       2 channel telemetry rhythm strip performed in our office today demonstrates normal sinus rhythm   Impression:  The patient is doing reasonably well approximately 3 months status post aortic valve replacement using a bioprosthetic tissue valve, mitral valve repair, and coronary artery bypass grafting 1.  He recently underwent repeat cardioversion for recurrent persistent atrial flutter. He appears to be maintaining sinus rhythm so far. However, the patient may be symptomatic related to his current medications including beta blockers and amiodarone with persistent nausea, dizziness, and poor energy.    Plan:  Under the circumstances related to recent cardioversion, we have not recommended any changes to the patient's current medications at this time. However, I have suggested that the patient make sure that he has a follow-up appointment scheduled with Dr. Sallyanne [name] within the next few weeks.  From a surgical standpoint I think the patient could resume driving an automobile, but given his recent history of dizzy spells I would be reluctant to let him drive a car. Once the patient is feeling better I've encouraged him to enroll and participate in outpatient cardiac rehabilitation program. All of his questions have been addressed.  The patient has been reminded regarding the importance of dental hygiene and the lifelong need for antibiotic prophylaxis for all dental cleanings and other related invasive procedures.  He will return for routine  follow-up neck September, approximately 1 year following his original surgery. During the interim period of time he will call and return as needed.   I spent in excess of 15 minutes during the conduct of this office consultation and >50% of this time involved direct face-to-face encounter with the patient for counseling and/or coordination of their care.    [name]. Roxy Manns, MD 02/12/2015 4:14 PM

## 2015-02-14 ENCOUNTER — Telehealth: Payer: Self-pay | Admitting: *Deleted

## 2015-02-14 NOTE — Telephone Encounter (Signed)
Left message for patient to decrease Amiodarone to 200 mg 1/2 tablet daily  - left message on cell number.

## 2015-02-16 ENCOUNTER — Ambulatory Visit (INDEPENDENT_AMBULATORY_CARE_PROVIDER_SITE_OTHER): Payer: BC Managed Care – PPO | Admitting: Cardiology

## 2015-02-16 ENCOUNTER — Encounter: Payer: Self-pay | Admitting: Cardiology

## 2015-02-16 VITALS — BP 112/78 | HR 57 | Ht 72.0 in | Wt 181.8 lb

## 2015-02-16 DIAGNOSIS — I38 Endocarditis, valve unspecified: Secondary | ICD-10-CM

## 2015-02-16 DIAGNOSIS — Z951 Presence of aortocoronary bypass graft: Secondary | ICD-10-CM

## 2015-02-16 DIAGNOSIS — I63139 Cerebral infarction due to embolism of unspecified carotid artery: Secondary | ICD-10-CM

## 2015-02-16 DIAGNOSIS — Z954 Presence of other heart-valve replacement: Secondary | ICD-10-CM

## 2015-02-16 DIAGNOSIS — I2510 Atherosclerotic heart disease of native coronary artery without angina pectoris: Secondary | ICD-10-CM

## 2015-02-16 DIAGNOSIS — I4891 Unspecified atrial fibrillation: Secondary | ICD-10-CM | POA: Diagnosis not present

## 2015-02-16 DIAGNOSIS — I480 Paroxysmal atrial fibrillation: Secondary | ICD-10-CM

## 2015-02-16 DIAGNOSIS — Z7901 Long term (current) use of anticoagulants: Secondary | ICD-10-CM

## 2015-02-16 DIAGNOSIS — Z953 Presence of xenogenic heart valve: Secondary | ICD-10-CM

## 2015-02-16 NOTE — Assessment & Plan Note (Signed)
Tissue AVR with MV repair, LA clipping 11/01/14

## 2015-02-16 NOTE — Assessment & Plan Note (Signed)
August 2016 with AOV and MV disease

## 2015-02-16 NOTE — Assessment & Plan Note (Signed)
PAF post op- did not tolerate usual Amiodarone dosing. S/P DCCV 12/18/14 and 02/06/15

## 2015-02-16 NOTE — Assessment & Plan Note (Signed)
LIMA to LAD 11/01/14

## 2015-02-16 NOTE — Assessment & Plan Note (Signed)
Multiple small subclinical embolic infarcts noted on MRI c/w septic emboli

## 2015-02-16 NOTE — Patient Instructions (Signed)
Keep schedule follow up app with Dr. Loletha Grayer on 02/28/2015 @ 9: 30 am

## 2015-02-16 NOTE — Progress Notes (Signed)
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 Preoperative evaluation was notable for the demonstration of subacute lacunar infarcts to the brain consistent with septic emboli that were subclinical as well as a large infarct in the spleen that was associated with left-sided abdominal discomfort. The patient additionally underwent consultation with Dental Medicine and GI medicine prior to surgery. Thorough dental exam, upper and lower endoscopy failed to identify any pathologic source of enterococcus within the GI tract. Postoperatively the patient developed paroxysmal atrial fibrillation. He was initially treated with amiodarone but this was discontinued because of elevated liver enzymes. He was discharged from the hospital in rate-controlled atrial fibrillation on warfarin anticoagulation. He underwent  DCCV on 12/18/2015 but was back in atrial flutter when he returned for follow-up to see Dr. Sallyanne Kuster on 01/11/2015. He was taken off of warfarin and aspirin at that time and restarted on amiodarone. Follow-up transthoracic echocardiogram revealed normal left ventricular systolic function with severe left atrial enlargement. The bioprosthetic tissue valve in the aortic position was functioning normally with mean transvalvular gradient estimated 8 mmHg. There was mild residual mitral regurgitation. He recently underwent repeat DC cardioversion on  02/06/2015. He is in the office today for follow up. He has been on Amiodarone 200 mg till 12/21/ when it was decreased to 100 mg daily secondary to dizziness and nausea.    Current Outpatient Prescriptions  Medication Sig Dispense Refill  . amiodarone (PACERONE) 100 MG tablet Take 100 mg by mouth daily.    Marland Kitchen apixaban (ELIQUIS) 5 MG TABS tablet Take 1 tablet (5 mg total) by mouth 2 (two) times daily. 60 tablet 5  . aspirin-acetaminophen-caffeine (EXCEDRIN MIGRAINE) 250-250-65 MG tablet Take 2 tablets by mouth daily as needed for migraine.    . Flaxseed, Linseed, (FLAXSEED OIL PO) Take 1 capsule by mouth 2 (two) times daily.    . furosemide (LASIX) 40 MG tablet Take 0.5 tablets (20 mg total) by mouth daily as needed. 30 tablet 0  . metoprolol tartrate (LOPRESSOR) 25 MG tablet Take 0.5 tablets (12.5 mg total) by mouth 2 (two) times daily. 30 tablet 3  . Multiple Vitamins-Minerals (PRESERVISION AREDS 2) CAPS Take 1 capsule by mouth 2 (two) times daily.    . pramipexole (MIRAPEX) 1.5 MG tablet Take 1.5 mg by mouth at bedtime.    . traMADol (ULTRAM) 50 MG tablet Take 1-2 tablets (50-100 mg total) by mouth every 4 (four) hours as needed for moderate pain. 30 tablet 0   No current facility-administered medications for this visit.    Allergies  Allergen Reactions  . Pollen Extract Other (See Comments)    sneezing    Social History   Social History  . Marital Status: Married    Spouse Name: N/A  . Number of Children: N/A  . Years of Education: N/A   Occupational History  . Not on file.   Social History Main Topics  . Smoking status: Former Research scientist (life sciences)  . Smokeless tobacco: Not on file     Comment: Smoked a pipe age 2->28  .  Alcohol Use: No     Comment: Drank from age 52->38  . Drug Use: No  . Sexual Activity: Not on file   Other Topics Concern  . Not on file   Social History Narrative     Review of Systems: General: negative for chills, fever, night sweats or weight changes.    Cardiovascular: negative for chest pain, dyspnea on exertion, edema, orthopnea, palpitations, paroxysmal nocturnal dyspnea or shortness of breath Dermatological: negative for rash Respiratory: negative for cough or wheezing Urologic: negative for hematuria Abdominal: negative for nausea, vomiting, diarrhea, bright red blood per rectum, melena, or hematemesis Neurologic: negative for visual changes, syncope, or dizziness All other systems reviewed and are otherwise negative except as noted above.    Blood pressure 112/78, pulse 57, height 6' (1.829 m), weight 181 lb 12.8 oz (82.464 kg).  General appearance: alert, cooperative and no distress Lungs: clear to auscultation bilaterally Heart: regular rate and rhythm and soft systolic murmur Extremities: no edema Skin: Skin color, texture, turgor normal. 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 He admits he is a little impatient over the progress of his recovery. I'll arrange for him to f/u with Dr Sallyanne Kuster in two weeks, if he is still having symptoms of nausea and dizziness he may need to come off Amiodarone.   Kerin Ransom K PA-C 02/16/2015 12:24 PM

## 2015-02-16 NOTE — Assessment & Plan Note (Signed)
Eliquis 

## 2015-02-20 NOTE — Progress Notes (Signed)
Patient ID: Alan [name], male   DOB: 1951-02-02, 64 y.o.   MRN: [id number]    Cardiology Office Note    Date:  02/28/2015   ID:  Alan [name], DOB 10-28-1950, MRN [id number]  PCP:  [name], MD  Cardiologist:   Sanda Klein, MD   Chief Complaint  Patient presents with  . Chest Pain    incisional pain  . Dizziness    with positional changes  . Leg Swelling    of ankles, doesn't go down at night, x2-3 weeks; his hands and feet are constantly cold.    History of Present Illness:  Alan [name] is a 64 y.o. 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 On November 01, 2014, he underwent AVR with a bioprosthesis (23 mm MagnaEase),patch enlargement of the aortic root (Nicks' technique) and MV repair without annuloplasty ring, as well as single-vessel LIMA to LAD bypass for incidentally discovered CAD and clipping of the left atrial appendage (40 mm Atricure clip).   Postop atrial flutter led to cardioversion on October 24. Amiodarone was stopped due to abnormal LFTs, but restarted for recurrent atrial flutter with a second cardioversion on November 17 (he declined hospitalization for sotalol or dofetilide initiation). He has maintained NSR, but developed nausea and dizziness, so amiodarone was reduced to 100 mg daily on  December 21.   He is now tolerating the amiodarone better, less nausea.  He describes anhedonia and hopelessness. He reports a life time of struggling with depressed mood "since he was 63-22 years old". He denies suicidal thoughts, but wonders why "he should keep living". He has lost 11 lb since December.  Echo 01/23/15 - Left ventricle: The cavity size was normal. There was severe concentric hypertrophy. Systolic function was normal. Theestimated ejection fraction was in the range of 60% to 65%.  Wallmotion was normal; there were no regional wall motionabnormalities. The study is not technically sufficient to allowevaluation of LV diastolic function. - Aortic valve: A bioprosthesis was present. The sewing ringappeared normal. Transvalvular velocity was within the normalrange. There was no stenosis. Peak velocity (S): 197 cm/s. Meangradient (S): 8 mm Hg. - Mitral valve: Prior procedures included surgical repair, thoughno surgical ring is appreciated. The findings are consistent withmild stenosis. There was mild regurgitation. Mean gradient (D): 44mm Hg. - Left atrium: The atrium was severely dilated. - Right ventricle: The cavity size was normal. Wall thickness wasnormal. Systolic function was moderately reduced. - Right atrium: The atrium was severely dilated. - Tricuspid valve: There was mild regurgitation. - Pulmonary arteries: PA peak pressure: 37 mm Hg (S).   Past Medical History  Diagnosis Date  . Restless leg syndrome   . Prostate cancer ([place])   . 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 Paroxysmal atrial fibrillation ([place]) 10/19/2014  . Paroxysmal atrial flutter ([place]) 10/19/2014  . Enterococcal bacteremia 10/19/2014  . 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 Protein-calorie malnutrition, severe ([place])   . 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 Coronary artery disease involving native coronary artery without angina pectoris 10/23/2014  . 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 Hepatitis   . S/P aortic valve  replacement with bioprosthetic valve 11/01/2014    23 mm [facility] Ease bovine pericardial bioprosthetic tissue valve with bovine pericardial patch enlargement of aortic root  . S/P CABG x 1  11/01/2014    LIMA to LAD  . S/P mitral valve repair 11/01/2014    Debridement of vegetation on anterior leaflet    Past Surgical History  Procedure Laterality Date  . Prostatectomy    . Cardiac catheterization N/A 10/23/2014    Procedure: Right/Left Heart Cath and Coronary Angiography;  Surgeon: Lorretta Harp, MD;  Location: [facility];  Service: Cardiovascular;  Laterality: N/A;  . Tee without cardioversion N/A 10/20/2014    Procedure: TRANSESOPHAGEAL ECHOCARDIOGRAM (TEE);  Surgeon: Josue Hector, MD;  Location: [facility] ENDOSCOPY;  Service: Cardiovascular;  Laterality: N/A;  . Colonoscopy N/A 10/25/2014    Procedure: COLONOSCOPY;  Surgeon: Arta Silence, MD;  Location: [facility] ENDOSCOPY;  Service: Endoscopy;  Laterality: N/A;  . Mitral valve repair N/A 11/01/2014    Procedure: MITRAL VALVE REPAIR with no Ring;  Surgeon: Rexene Alberts, MD;  Location: [place];  Service: Open Heart Surgery;  Laterality: N/A;  . Coronary artery bypass graft N/A 11/01/2014    Procedure: CORONARY ARTERY BYPASS GRAFTING (CABG) x 1 using internal mammary artery;  Surgeon: Rexene Alberts, MD;  Location: [place];  Service: Open Heart Surgery;  Laterality: N/A;  . Tee without cardioversion N/A 11/01/2014    Procedure: TRANSESOPHAGEAL ECHOCARDIOGRAM (TEE);  Surgeon: Rexene Alberts, MD;  Location: [place];  Service: Open Heart Surgery;  Laterality: N/A;  . Aortic valve replacement N/A 11/01/2014    Procedure: AORTIC VALVE REPLACEMENT (AVR);  Surgeon: Rexene Alberts, MD;  Location: [place];  Service: Open Heart Surgery;  Laterality: N/A;  . Aortic root enlargement N/A 11/01/2014    Procedure: AORTIC ROOT ENLARGEMENT;  Surgeon: Rexene Alberts, MD;  Location: [place];  Service: Open Heart Surgery;  Laterality: N/A;  . Cardioversion N/A 12/18/2014    Procedure: CARDIOVERSION;  Surgeon: Pixie Casino, MD;  Location: [facility] ENDOSCOPY;  Service: Cardiovascular;  Laterality: N/A;  . Cardioversion N/A 02/06/2015    Procedure: CARDIOVERSION;   Surgeon: Larey Dresser, MD;  Location: [place];  Service: Cardiovascular;  Laterality: N/A;    Current Outpatient Prescriptions  Medication Sig Dispense Refill  . amiodarone (PACERONE) 100 MG tablet Take 100 mg by mouth 3 (three) times a week.    Marland Kitchen apixaban (ELIQUIS) 5 MG TABS tablet Take 1 tablet (5 mg total) by mouth 2 (two) times daily. 60 tablet 5  . aspirin-acetaminophen-caffeine (EXCEDRIN MIGRAINE) 250-250-65 MG tablet Take 2 tablets by mouth daily as needed for migraine.    . Flaxseed, Linseed, (FLAXSEED OIL PO) Take 1 capsule by mouth 2 (two) times daily.    . furosemide (LASIX) 40 MG tablet Take 0.5 tablets (20 mg total) by mouth daily as needed. 30 tablet 0  . metoprolol tartrate (LOPRESSOR) 25 MG tablet Take 0.5 tablets (12.5 mg total) by mouth at bedtime. 30 tablet 3  . Multiple Vitamins-Minerals (PRESERVISION AREDS 2) CAPS Take 1 capsule by mouth 2 (two) times daily.    . pramipexole (MIRAPEX) 1.5 MG tablet Take 1.5 mg by mouth at bedtime.    . sertraline (ZOLOFT) 50 MG tablet Take 1 tablet (50 mg total) by mouth at bedtime. 30 tablet 3   No current facility-administered medications for this visit.    Allergies:   Pollen extract   Social History   Social History  .  Marital Status: Married    Spouse Name: N/A  . Number of Children: N/A  . Years of Education: N/A   Social History Main Topics  . Smoking status: Former Research scientist (life sciences)  . Smokeless tobacco: None     Comment: Smoked a pipe age 60->28  . Alcohol Use: No     Comment: Drank from age 2->38  . Drug Use: No  . Sexual Activity: Not Asked   Other Topics Concern  . None   Social History Narrative     Family History:  The patient's family history is negative for CAD.   ROS:   Please see the history of present illness.    ROS All other systems reviewed and are negative.   PHYSICAL EXAM:   VS:  BP 106/66 mmHg  Pulse 63  Ht 6' (1.829 m)  Wt 170 lb 8 oz (77.338 kg)  BMI 23.12 kg/m2   General: Alert,  oriented x3, no distress Head: no evidence of trauma, PERRL, EOMI, no exophtalmos or lid lag, no myxedema, no xanthelasma; normal ears, nose and oropharynx Neck: normal jugular venous pulsations and no hepatojugular reflux; brisk carotid pulses without delay and no carotid bruits Chest: clear to auscultation, no signs of consolidation by percussion or palpation, normal fremitus, symmetrical and full respiratory excursions. Well-healed sternotomy Cardiovascular: normal position and quality of the apical impulse, regular rhythm, normal first and second heart sounds, [id number] Aortic systolic ejection murmur is early peaking, no diastolic murmurs, rubs or gallops Abdomen: no tenderness or distention, no masses by palpation, no abnormal pulsatility or arterial bruits, normal bowel sounds, no hepatosplenomegaly Extremities: no clubbing, cyanosis or edema; 2+ radial, ulnar and brachial pulses bilaterally; 2+ right femoral, posterior tibial and dorsalis pedis pulses; 2+ left femoral, posterior tibial and dorsalis pedis pulses; no subclavian or femoral bruits Neurological: grossly nonfocal, Alert and Oriented x 3, Strength and sensation are intact Psych: depressed mood Skin: warm and dry, no rash    Wt Readings from Last 3 Encounters:  02/28/15 170 lb 8 oz (77.338 kg)  02/16/15 181 lb 12.8 oz (82.464 kg)  02/12/15 155 lb (70.308 kg)      Studies/Labs Reviewed:   EKG:  EKG is ordered today.  The ekg ordered today demonstrates NSR, rightward axis, QS V1-V2, QTC 452 ms  Recent Labs: 10/18/2014: TSH 1.708 11/25/2014: Magnesium 1.9 12/13/2014: Hemoglobin 12.6*; Platelets 200 02/22/2015: ALT 27; BUN 23; Creatinine, Ser 1.39*; Potassium 4.3; Sodium 141   Lipid Panel No results found for: CHOL, TRIG, HDL, CHOLHDL, VLDL, LDLCALC, LDLDIRECT  Additional studies/ records that were reviewed today include:  Notes from Dr. Roxy Manns    ASSESSMENT:    1. Typical atrial flutter (HCC)   2. Endocarditis of  mitral valve - Enterococcus   3. Aortic valve endocarditis - Enterococcus   4. Coronary artery disease involving native coronary artery of native heart without angina pectoris   5. S/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 + mitral valve repair + CABG x1   6. Chronic anticoagulation      PLAN:  In order of problems listed above:  1. No recurrence since last cardioversion. Will plan to wean off antiarrhythmics very slowly.  2. S/P MV repair 3. S/P AV replacement with bioprosthesis for severe AI 4. Incidentally discovered CAD, no angina preop. Review lipid profile once he has recovered fully from surgery and is back to normal nutritional status/activity. 5. Normal prosthetic valve function and MV function by recent echo 6. He is on Eliquis. His AFl  was related to postop pericarditis rather than "valvular heart disease/ endocarditis". Will plan to stop anticoagulation if he remains arrhythmia free after he has been off amiodarone for several months. His strokes were due to vegetations, not arrhythmia. 7. Start sertraline, discuss further management with PCP or psych specialist. Discussed the common occurrence/worsening of depression following cardiac surgery. Decreasing the amiodarone and beta blocker may help increase his energy level.   Medication Adjustments/Labs and Tests Ordered: Current medicines are reviewed at length with the patient today.  Concerns regarding medicines are outlined above.  Medication changes, Labs and Tests ordered today are listed below. Patient Instructions  Dr Sallyanne Kuster has recommended making the following medication changes: DECREASE Amiodarone to 100 mg (1 tablet) by mouth 3 times weekly - Monday, Wednesday, Friday DECREASE Metoprolol to 12.5 mg (1/2 tablet) by mouth at bedtime START Sertraline (Zoloft) 50 mg - take 1 tablet (50 mg total) by mouth at bedtime  Your physician recommends that you schedule a follow-up appointment in 3 months. You will  receive a reminder letter in the mail two months in advance. If you don't receive a letter, please call our office to schedule the follow-up appointment.  If you need a refill on your cardiac medications before your next appointment, please call your pharmacy.      Mikael Spray, MD  02/28/2015 11:40 AM    [facility] [address], [place],   [address] Phone: [phone]; Fax: [phone]

## 2015-02-21 ENCOUNTER — Telehealth: Payer: Self-pay

## 2015-02-21 DIAGNOSIS — Z79899 Other long term (current) drug therapy: Secondary | ICD-10-CM

## 2015-02-21 NOTE — Telephone Encounter (Signed)
Order placed for patient to have CMET prior to office visit with [facility].

## 2015-02-21 NOTE — Telephone Encounter (Signed)
-----   Message from Sanda Klein, MD sent at 02/20/2015  2:14 PM EST ----- Please have him get a CMET before his appointment

## 2015-02-23 LAB — COMPREHENSIVE METABOLIC PANEL
A/G RATIO: 1.6 (ref 1.1–2.5)
ALBUMIN: 3.9 g/dL (ref 3.6–4.8)
ALT: 27 IU/L (ref 0–44)
AST: 30 IU/L (ref 0–40)
Alkaline Phosphatase: 68 IU/L (ref 39–117)
BUN/Creatinine Ratio: 17 (ref 10–22)
BUN: 23 mg/dL (ref 8–27)
Bilirubin Total: 0.7 mg/dL (ref 0.0–1.2)
CALCIUM: 9.4 mg/dL (ref 8.6–10.2)
CO2: 23 mmol/L (ref 18–29)
Chloride: 102 mmol/L (ref 96–106)
Creatinine, Ser: 1.39 mg/dL — ABNORMAL HIGH (ref 0.76–1.27)
GFR, EST AFRICAN AMERICAN: 61 mL/min/{1.73_m2} (ref >59)
GFR, EST NON AFRICAN AMERICAN: 52 mL/min/{1.73_m2} — AB (ref >59)
GLOBULIN, TOTAL: 2.4 g/dL (ref 1.5–4.5)
Glucose: 109 mg/dL — ABNORMAL HIGH (ref 65–99)
POTASSIUM: 4.3 mmol/L (ref 3.5–5.2)
SODIUM: 141 mmol/L (ref 134–144)
TOTAL PROTEIN: 6.3 g/dL (ref 6.0–8.5)

## 2015-02-27 ENCOUNTER — Ambulatory Visit: Payer: Medicare Other | Admitting: Internal Medicine

## 2015-02-27 ENCOUNTER — Telehealth: Payer: Self-pay | Admitting: *Deleted

## 2015-02-27 NOTE — Telephone Encounter (Signed)
LM with lab results.  Instructed to call the office with any questions.

## 2015-02-28 ENCOUNTER — Encounter: Payer: Self-pay | Admitting: Cardiovascular Disease

## 2015-02-28 ENCOUNTER — Ambulatory Visit (INDEPENDENT_AMBULATORY_CARE_PROVIDER_SITE_OTHER): Payer: BC Managed Care – PPO | Admitting: Cardiovascular Disease

## 2015-02-28 VITALS — BP 106/66 | HR 63 | Ht 72.0 in | Wt 170.5 lb

## 2015-02-28 DIAGNOSIS — Z7901 Long term (current) use of anticoagulants: Secondary | ICD-10-CM

## 2015-02-28 DIAGNOSIS — F329 Major depressive disorder, single episode, unspecified: Secondary | ICD-10-CM

## 2015-02-28 DIAGNOSIS — I2510 Atherosclerotic heart disease of native coronary artery without angina pectoris: Secondary | ICD-10-CM

## 2015-02-28 DIAGNOSIS — Z953 Presence of xenogenic heart valve: Secondary | ICD-10-CM

## 2015-02-28 DIAGNOSIS — I059 Rheumatic mitral valve disease, unspecified: Secondary | ICD-10-CM

## 2015-02-28 DIAGNOSIS — I058 Other rheumatic mitral valve diseases: Secondary | ICD-10-CM

## 2015-02-28 DIAGNOSIS — F32A Depression, unspecified: Secondary | ICD-10-CM

## 2015-02-28 DIAGNOSIS — I358 Other nonrheumatic aortic valve disorders: Secondary | ICD-10-CM | POA: Diagnosis not present

## 2015-02-28 DIAGNOSIS — Z954 Presence of other heart-valve replacement: Secondary | ICD-10-CM

## 2015-02-28 DIAGNOSIS — I483 Typical atrial flutter: Secondary | ICD-10-CM

## 2015-02-28 MED ORDER — METOPROLOL TARTRATE 25 MG PO TABS: 12.5000 mg | ORAL_TABLET | Freq: Every day | ORAL | Status: DC

## 2015-02-28 MED ORDER — SERTRALINE HCL 50 MG PO TABS: 50.0000 mg | ORAL_TABLET | Freq: Every day | ORAL | Status: DC

## 2015-02-28 NOTE — Patient Instructions (Signed)
Dr Sallyanne Kuster has recommended making the following medication changes: DECREASE Amiodarone to 100 mg (1 tablet) by mouth 3 times weekly - Monday, Wednesday, Friday DECREASE Metoprolol to 12.5 mg (1/2 tablet) by mouth at bedtime START Sertraline (Zoloft) 50 mg - take 1 tablet (50 mg total) by mouth at bedtime  Your physician recommends that you schedule a follow-up appointment in 3 months. You will receive a reminder letter in the mail two months in advance. If you don't receive a letter, please call our office to schedule the follow-up appointment.  If you need a refill on your cardiac medications before your next appointment, please call your pharmacy.

## 2015-06-29 ENCOUNTER — Encounter: Payer: Self-pay | Admitting: Cardiovascular Disease

## 2015-06-29 ENCOUNTER — Ambulatory Visit (INDEPENDENT_AMBULATORY_CARE_PROVIDER_SITE_OTHER): Payer: BC Managed Care – PPO | Admitting: Cardiovascular Disease

## 2015-06-29 VITALS — BP 127/80 | HR 62 | Ht 72.0 in | Wt 175.2 lb

## 2015-06-29 DIAGNOSIS — Z954 Presence of other heart-valve replacement: Secondary | ICD-10-CM

## 2015-06-29 DIAGNOSIS — F32A Depression, unspecified: Secondary | ICD-10-CM

## 2015-06-29 DIAGNOSIS — F329 Major depressive disorder, single episode, unspecified: Secondary | ICD-10-CM

## 2015-06-29 DIAGNOSIS — Z8679 Personal history of other diseases of the circulatory system: Secondary | ICD-10-CM

## 2015-06-29 DIAGNOSIS — I2510 Atherosclerotic heart disease of native coronary artery without angina pectoris: Secondary | ICD-10-CM

## 2015-06-29 DIAGNOSIS — I483 Typical atrial flutter: Secondary | ICD-10-CM

## 2015-06-29 DIAGNOSIS — Z953 Presence of xenogenic heart valve: Secondary | ICD-10-CM

## 2015-06-29 NOTE — Progress Notes (Signed)
Patient ID: [name], male   DOB: 29-Apr-1950, 65 y.o.   MRN: [id number] Patient ID: Alan [name], male   DOB: August 24, 1950, 65 y.o.   MRN: [id number]    Cardiology Office Note    Date:  06/29/2015   ID:  Alan [name], DOB 12-12-1950, MRN [id number]  PCP:  [name], MD  Cardiologist:   Sanda Klein, MD   Chief Complaint  Patient presents with  . Follow-up    occassional chest pain, no shortness of breath, occassional edema, occassional cramping in legs, has lightheaded or dizziness    History of Present Illness:  Alan [name] is a 65 y.o. 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 On November 01, 2014, he underwent AVR with a bioprosthesis (23 mm MagnaEase),patch enlargement of the aortic root (Nicks' technique) and MV repair without annuloplasty ring, as well as single-vessel LIMA to LAD bypass for incidentally discovered CAD and clipping of the left atrial appendage (40 mm Atricure clip).   Postop atrial flutter led to cardioversion on October 24. Amiodarone was stopped due to abnormal LFTs, but restarted for recurrent atrial flutter with a second cardioversion on November 17 (he declined hospitalization for sotalol or dofetilide initiation). He has maintained NSR, but developed nausea and dizziness, so amiodarone was reduced to 100 mg daily on  December 21.   He is now tolerating the amiodarone better, no nausea. He does complain of occasional dizziness in a pattern that suggests orthostatic hypotension.  His depression has definitely improved. Whether it is just natural recovery from his bypass procedure or the effects of sertraline, he has a more positive outlook. He has gained back about 5 pounds since January. He no longer describes anhedonia. He is not working at [employer] anymore but works at  the [employer].  Echo 01/23/15 - Left ventricle: The cavity size was normal. There was severe concentric hypertrophy. Systolic function was normal. Theestimated ejection fraction was in the range of 60% to 65%. Wallmotion was normal; there were no regional wall motionabnormalities. The study is not technically sufficient to allowevaluation of LV diastolic function. - Aortic valve: A bioprosthesis was present. The sewing ringappeared normal. Transvalvular velocity was within the normalrange. There was no stenosis. Peak velocity (S): 197 cm/s. Meangradient (S): 8 mm Hg. - Mitral valve: Prior procedures included surgical repair, thoughno surgical ring is appreciated. The findings are consistent withmild stenosis. There was mild regurgitation. Mean gradient (D): 63mm Hg. - Left atrium: The atrium was severely dilated. - Right ventricle: The cavity size was normal. Wall thickness wasnormal. Systolic function was moderately reduced. - Right atrium: The atrium was severely dilated. - Tricuspid valve: There was mild regurgitation. - Pulmonary arteries: PA peak pressure: 37 mm Hg (S).   Past Medical History  Diagnosis Date  . Restless leg syndrome   . Prostate cancer ([place])   . 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 Paroxysmal atrial fibrillation ([place]) 10/19/2014  . Paroxysmal atrial flutter ([place]) 10/19/2014  . Enterococcal bacteremia 10/19/2014  . 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 Protein-calorie malnutrition, severe ([place])   . 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 Coronary artery disease involving native coronary artery without angina pectoris 10/23/2014  .  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 Hepatitis    . 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/2014    23 mm [facility] Ease bovine pericardial bioprosthetic tissue valve with bovine pericardial patch enlargement of aortic root  . 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 S/P mitral valve repair 11/01/2014    Debridement of vegetation on anterior leaflet    Past Surgical History  Procedure Laterality Date  . Prostatectomy    . Cardiac catheterization N/A 10/23/2014    Procedure: Right/Left Heart Cath and Coronary Angiography;  Surgeon: Lorretta Harp, MD;  Location: [facility];  Service: Cardiovascular;  Laterality: N/A;  . Tee without cardioversion N/A 10/20/2014    Procedure: TRANSESOPHAGEAL ECHOCARDIOGRAM (TEE);  Surgeon: Josue Hector, MD;  Location: [facility] ENDOSCOPY;  Service: Cardiovascular;  Laterality: N/A;  . Colonoscopy N/A 10/25/2014    Procedure: COLONOSCOPY;  Surgeon: Arta Silence, MD;  Location: [facility] ENDOSCOPY;  Service: Endoscopy;  Laterality: N/A;  . Mitral valve repair N/A 11/01/2014    Procedure: MITRAL VALVE REPAIR with no Ring;  Surgeon: Rexene Alberts, MD;  Location: [place];  Service: Open Heart Surgery;  Laterality: N/A;  . Coronary artery bypass graft N/A 11/01/2014    Procedure: CORONARY ARTERY BYPASS GRAFTING (CABG) x 1 using internal mammary artery;  Surgeon: Rexene Alberts, MD;  Location: [place];  Service: Open Heart Surgery;  Laterality: N/A;  . Tee without cardioversion N/A 11/01/2014    Procedure: TRANSESOPHAGEAL ECHOCARDIOGRAM (TEE);  Surgeon: Rexene Alberts, MD;  Location: [place];  Service: Open Heart Surgery;  Laterality: N/A;  . Aortic valve replacement N/A 11/01/2014    Procedure: AORTIC VALVE REPLACEMENT (AVR);  Surgeon: Rexene Alberts, MD;  Location: [place];  Service: Open Heart Surgery;  Laterality: N/A;  . Aortic root enlargement N/A 11/01/2014    Procedure: AORTIC ROOT ENLARGEMENT;  Surgeon: Rexene Alberts, MD;  Location: [place];  Service: Open Heart Surgery;  Laterality: N/A;  .  Cardioversion N/A 12/18/2014    Procedure: CARDIOVERSION;  Surgeon: Pixie Casino, MD;  Location: [facility] ENDOSCOPY;  Service: Cardiovascular;  Laterality: N/A;  . Cardioversion N/A 02/06/2015    Procedure: CARDIOVERSION;  Surgeon: Larey Dresser, MD;  Location: [place];  Service: Cardiovascular;  Laterality: N/A;    Current Outpatient Prescriptions  Medication Sig Dispense Refill  . aspirin-acetaminophen-caffeine (EXCEDRIN MIGRAINE) 250-250-65 MG tablet Take 2 tablets by mouth daily as needed for migraine.    . Flaxseed, Linseed, (FLAXSEED OIL PO) Take 1 capsule by mouth 2 (two) times daily.    . Multiple Vitamins-Minerals (PRESERVISION AREDS 2) CAPS Take 1 capsule by mouth 2 (two) times daily.    . pramipexole (MIRAPEX) 1.5 MG tablet Take 1.5 mg by mouth at bedtime.    . sertraline (ZOLOFT) 50 MG tablet Take 1 tablet (50 mg total) by mouth at bedtime. 30 tablet 3   No current facility-administered medications for this visit.    Allergies:   Pollen extract   Social History   Social History  . Marital Status: Married    Spouse Name: N/A  . Number of Children: N/A  . Years of Education: N/A   Social History Main Topics  . Smoking status: Former Research scientist (life sciences)  . Smokeless tobacco: None     Comment: Smoked a pipe age 74->28  . Alcohol Use: No  Comment: Drank from age 64->38  . Drug Use: No  . Sexual Activity: Not Asked   Other Topics Concern  . None   Social History Narrative     Family History:  The patient's family history is negative for CAD.   ROS:   Please see the history of present illness.    ROS All other systems reviewed and are negative.   PHYSICAL EXAM:   VS:  BP 127/80 mmHg  Pulse 62  Ht 6' (1.829 m)  Wt 79.493 kg (175 lb 4 oz)  BMI 23.76 kg/m2   General: Alert, oriented x3, no distress Head: no evidence of trauma, PERRL, EOMI, no exophtalmos or lid lag, no myxedema, no xanthelasma; normal ears, nose and oropharynx Neck: normal jugular venous  pulsations and no hepatojugular reflux; brisk carotid pulses without delay and no carotid bruits Chest: clear to auscultation, no signs of consolidation by percussion or palpation, normal fremitus, symmetrical and full respiratory excursions. Well-healed sternotomy Cardiovascular: normal position and quality of the apical impulse, regular rhythm, normal first and second heart sounds, [id number] Aortic systolic ejection murmur is early peaking, no diastolic murmurs, rubs or gallops Abdomen: no tenderness or distention, no masses by palpation, no abnormal pulsatility or arterial bruits, normal bowel sounds, no hepatosplenomegaly Extremities: no clubbing, cyanosis or edema; 2+ radial, ulnar and brachial pulses bilaterally; 2+ right femoral, posterior tibial and dorsalis pedis pulses; 2+ left femoral, posterior tibial and dorsalis pedis pulses; no subclavian or femoral bruits Neurological: grossly nonfocal, Alert and Oriented x 3, Strength and sensation are intact Psych: depressed mood Skin: warm and dry, no rash    Wt Readings from Last 3 Encounters:  06/29/15 79.493 kg (175 lb 4 oz)  02/28/15 77.338 kg (170 lb 8 oz)  02/16/15 82.464 kg (181 lb 12.8 oz)      Studies/Labs Reviewed:   EKG:  EKG is ordered today.  The ekg ordered today demonstrates NSR, rightward axis, QS V1-V2, QTC 452 ms  Recent Labs: 10/18/2014: TSH 1.708 11/25/2014: Magnesium 1.9 12/13/2014: Hemoglobin 12.6*; Platelets 200 02/22/2015: ALT 27; BUN 23; Creatinine, Ser 1.39*; Potassium 4.3; Sodium 141   Lipid Panel No results found for: CHOL, TRIG, HDL, CHOLHDL, VLDL, LDLCALC, LDLDIRECT  Additional studies/ records that were reviewed today include:  Notes from Dr. Roxy Manns    ASSESSMENT:    1. Typical atrial flutter ([place])   2. History of endocarditis   3. S/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 + mitral valve repair + CABG x1   4. Coronary artery disease involving native coronary artery of native heart  without angina pectoris   5. Depression      PLAN:  In order of problems listed above:  1. AFlutter: No recurrence since last cardioversion. No recurrence since reducing the dose of amiodarone, now on a trivial amount. He has symptoms of orthostatic hypotension, likely due to his beta blocker. We'll discontinue both these medications. He also had his left atrial appendage clipped at the time of surgery. We'll discontinue anticoagulation. If typical cavotricuspid isthmus-dependent atrial flutter recurs, he would be a good candidate for ablation. His strokes were due to vegetations, not arrhythmia. 2. Endocarditis: Reminded of the need for lifelong endocarditis prophylaxis with dental procedures, GI endoscopic procedures that involve biopsy/polypectomy, etc. he is not penicillin allergic. 3. S/P AVR and MV repair: Plan to reassess by echo in about a year 4. CAD: Incidentally discovered, no angina preop. Review lipid profile now that he has recovered fully from surgery and is back  to normal nutritional status/activity. 5. It is common to see worsening of depression following cardiac surgery. In view of his history of depression for many years continue sertraline for at least 12 months  Medication Adjustments/Labs and Tests Ordered: Current medicines are reviewed at length with the patient today.  Concerns regarding medicines are outlined above.  Medication changes, Labs and Tests ordered today are listed below. Patient Instructions  Medication Instructions: Dr Dr Sallyanne Kuster has recommended making the following medication changes: 1. STOP Amiodarone 2. STOP Eliquis 3. STOP Metoprolol Succinate  Labwork: NONE  Testing/Procedures: 1. Echocardiogram - Your physician has requested that you have an echocardiogram in 6 months. Echocardiography is a painless test that uses sound waves to create images of your heart. It provides your doctor with information about the size and shape of your heart and how  well your heart's chambers and valves are working. This procedure takes approximately one hour. There are no restrictions for this procedure. This will be done at our [facility] office - the address is [place].  Follow-up: Dr Vivia Ewing recommends that you schedule a follow-up appointment in 6 months. You will receive a reminder letter in the mail two months in advance. If you don't receive a letter, please call our office to schedule the follow-up appointment.  If you need a refill on your cardiac medications before your next appointment, please call your pharmacy.    **Please remember that you need antibiotics for endocarditis prophylaxis prior to any dental procedures or surgeries.       Mikael Spray, MD  06/29/2015 6:12 PM    [facility] [place], [place], [place]  [address] Phone: [phone]; Fax: [phone]

## 2015-06-29 NOTE — Patient Instructions (Signed)
Medication Instructions: Dr Dr Sallyanne Kuster has recommended making the following medication changes: 1. STOP Amiodarone 2. STOP Eliquis 3. STOP Metoprolol Succinate  Labwork: NONE  Testing/Procedures: 1. Echocardiogram - Your physician has requested that you have an echocardiogram in 6 months. Echocardiography is a painless test that uses sound waves to create images of your heart. It provides your doctor with information about the size and shape of your heart and how well your heart's chambers and valves are working. This procedure takes approximately one hour. There are no restrictions for this procedure. This will be done at our [facility] office - the address is [place].  Follow-up: Dr Vivia Ewing recommends that you schedule a follow-up appointment in 6 months. You will receive a reminder letter in the mail two months in advance. If you don't receive a letter, please call our office to schedule the follow-up appointment.  If you need a refill on your cardiac medications before your next appointment, please call your pharmacy.    **Please remember that you need antibiotics for endocarditis prophylaxis prior to any dental procedures or surgeries.

## 2015-06-30 ENCOUNTER — Other Ambulatory Visit: Payer: Self-pay | Admitting: Cardiovascular Disease

## 2015-07-02 NOTE — Telephone Encounter (Signed)
Rx request sent to pharmacy.  

## 2015-09-03 ENCOUNTER — Telehealth: Payer: Self-pay

## 2015-09-03 NOTE — Telephone Encounter (Signed)
lmtcb to update family and medical history

## 2015-09-10 ENCOUNTER — Telehealth: Payer: Self-pay | Admitting: Cardiovascular Disease

## 2015-09-10 ENCOUNTER — Encounter: Payer: Self-pay | Admitting: Cardiovascular Disease

## 2015-09-10 ENCOUNTER — Ambulatory Visit (INDEPENDENT_AMBULATORY_CARE_PROVIDER_SITE_OTHER): Payer: BC Managed Care – PPO | Admitting: Cardiovascular Disease

## 2015-09-10 VITALS — BP 111/70 | HR 83 | Ht 72.0 in | Wt 174.0 lb

## 2015-09-10 DIAGNOSIS — Z9889 Other specified postprocedural states: Secondary | ICD-10-CM

## 2015-09-10 DIAGNOSIS — R2681 Unsteadiness on feet: Secondary | ICD-10-CM | POA: Diagnosis not present

## 2015-09-10 DIAGNOSIS — F32A Depression, unspecified: Secondary | ICD-10-CM

## 2015-09-10 DIAGNOSIS — I38 Endocarditis, valve unspecified: Secondary | ICD-10-CM | POA: Diagnosis not present

## 2015-09-10 DIAGNOSIS — I2510 Atherosclerotic heart disease of native coronary artery without angina pectoris: Secondary | ICD-10-CM

## 2015-09-10 DIAGNOSIS — Z8673 Personal history of transient ischemic attack (TIA), and cerebral infarction without residual deficits: Secondary | ICD-10-CM | POA: Diagnosis not present

## 2015-09-10 DIAGNOSIS — F329 Major depressive disorder, single episode, unspecified: Secondary | ICD-10-CM

## 2015-09-10 DIAGNOSIS — I483 Typical atrial flutter: Secondary | ICD-10-CM

## 2015-09-10 NOTE — Telephone Encounter (Signed)
Left message for patient to call and reschedule his echocardiogram for November per his AVS.  It was scheduled for August in error.

## 2015-09-10 NOTE — Progress Notes (Signed)
Patient ID: Alan [name], male   DOB: 1950-04-17, 65 y.o.   MRN: [id number] Patient ID: Alan [name], male   DOB: [birth year], 65 y.o.   MRN: [id number]    Cardiology Office Note    Date:  09/10/2015   ID:  Alan [name], DOB [birth year], MRN [id number]  PCP:  [name], MD  Cardiologist:   Sanda Klein, MD   Chief Complaint  Patient presents with  . Dizziness    pt c/o of dizziness after laying down or bending to a standing positions    History of Present Illness:  Alan [name] is a 65 y.o. 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 On November 01, 2014, he underwent AVR with a bioprosthesis (23 mm MagnaEase),patch enlargement of the aortic root (Nicks' technique) and MV repair without annuloplasty ring, as well as single-vessel LIMA to LAD bypass for incidentally discovered CAD and clipping of the left atrial appendage (40 mm Atricure clip). Postop atrial flutter led to cardioversion on October 24. Amiodarone was stopped due to abnormal LFTs, but restarted for recurrent atrial flutter with a second cardioversion on November 17 (he declined hospitalization for sotalol or dofetilide initiation). He has maintained NSR, but developed nausea and dizziness, so amiodarone was discontinued. There has been no subsequent recurrence of atrial arrhythmia.  His depression has definitely improved.  He presents today with complaints of dizziness and unsteady gait. He specifically describes poor "proprioception". He no longer walks the dog at night since he finds himself very unsteady without visual references.. Partly, his dizziness is suggestive of orthostatic hypotension, but dizziness is sometimes present without changes in position.  Vital signs checked today showed a borderline low blood pressure but no significant  drop in blood pressure with orthostasis, either immediately or after 3 minutes.   Echo 01/23/15 - Left ventricle: The cavity size was normal. There was severe concentric hypertrophy. Systolic function was normal. Theestimated ejection fraction was in the range of 60% to 65%. Wallmotion was normal; there were no regional wall motionabnormalities. The study is not technically sufficient to allowevaluation of LV diastolic function. - Aortic valve: A bioprosthesis was present. The sewing ringappeared normal. Transvalvular velocity was within the normalrange. There was no stenosis. Peak velocity (S): 197 cm/s. Meangradient (S): 8 mm Hg. - Mitral valve: Prior procedures included surgical repair, thoughno surgical ring is appreciated. The findings are consistent withmild stenosis. There was mild regurgitation. Mean gradient (D): 7mm Hg. - Left atrium: The atrium was severely dilated. - Right ventricle: The cavity size was normal. Wall thickness wasnormal. Systolic function was moderately reduced. - Right atrium: The atrium was severely dilated. - Tricuspid valve: There was mild regurgitation. - Pulmonary arteries: PA peak pressure: 37 mm Hg (S).   Past Medical History  Diagnosis Date  . Restless leg syndrome   . Prostate cancer ([place])   . 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 Paroxysmal atrial fibrillation ([place]) 10/19/2014  . Paroxysmal atrial flutter ([place]) 10/19/2014  . Enterococcal bacteremia 10/19/2014  . 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 Protein-calorie malnutrition, severe ([place])   . 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 Coronary artery disease involving native coronary artery without angina pectoris 10/23/2014  . 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 Hepatitis   . 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/2014    23 mm [facility] Ease bovine pericardial bioprosthetic tissue valve with bovine pericardial patch enlargement of aortic root  . 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 S/P mitral valve repair 11/01/2014    Debridement of vegetation on anterior leaflet    Past Surgical History  Procedure Laterality Date  . Prostatectomy    . Cardiac catheterization N/A 10/23/2014    Procedure: Right/Left Heart Cath and Coronary Angiography;  Surgeon: Lorretta Harp, MD;  Location: [place] [facility];  Service: Cardiovascular;  Laterality: N/A;  . Tee without cardioversion N/A 10/20/2014    Procedure: TRANSESOPHAGEAL ECHOCARDIOGRAM (TEE);  Surgeon: Josue Hector, MD;  Location: [facility] ENDOSCOPY;  Service: Cardiovascular;  Laterality: N/A;  . Colonoscopy N/A 10/25/2014    Procedure: COLONOSCOPY;  Surgeon: Arta Silence, MD;  Location: [facility] ENDOSCOPY;  Service: Endoscopy;  Laterality: N/A;  . Mitral valve repair N/A 11/01/2014    Procedure: MITRAL VALVE REPAIR with no Ring;  Surgeon: Rexene Alberts, MD;  Location: [place];  Service: Open Heart Surgery;  Laterality: N/A;  . Coronary artery bypass graft N/A 11/01/2014    Procedure: CORONARY ARTERY BYPASS GRAFTING (CABG) x 1 using internal mammary artery;  Surgeon: Rexene Alberts, MD;  Location: [place];  Service: Open Heart Surgery;  Laterality: N/A;  . Tee without cardioversion N/A 11/01/2014    Procedure: TRANSESOPHAGEAL ECHOCARDIOGRAM (TEE);  Surgeon: Rexene Alberts, MD;  Location: [place];  Service: Open Heart Surgery;  Laterality: N/A;  . Aortic valve replacement N/A 11/01/2014    Procedure: AORTIC VALVE REPLACEMENT (AVR);  Surgeon: Rexene Alberts, MD;  Location: [place];  Service: Open Heart Surgery;  Laterality: N/A;  . Aortic root enlargement N/A 11/01/2014    Procedure: AORTIC ROOT ENLARGEMENT;  Surgeon: Rexene Alberts, MD;   Location: [place];  Service: Open Heart Surgery;  Laterality: N/A;  . Cardioversion N/A 12/18/2014    Procedure: CARDIOVERSION;  Surgeon: Pixie Casino, MD;  Location: [facility] ENDOSCOPY;  Service: Cardiovascular;  Laterality: N/A;  . Cardioversion N/A 02/06/2015    Procedure: CARDIOVERSION;  Surgeon: Larey Dresser, MD;  Location: [place];  Service: Cardiovascular;  Laterality: N/A;    Current Outpatient Prescriptions  Medication Sig Dispense Refill  . aspirin-acetaminophen-caffeine (EXCEDRIN MIGRAINE) 250-250-65 MG tablet Take 2 tablets by mouth daily as needed for migraine.    . Flaxseed, Linseed, (FLAXSEED OIL PO) Take 1 capsule by mouth 2 (two) times daily.    . Multiple Vitamins-Minerals (PRESERVISION AREDS 2) CAPS Take 1 capsule by mouth 2 (two) times daily.    . pramipexole (MIRAPEX) 1.5 MG tablet Take 1.5 mg by mouth at bedtime.    . sertraline (ZOLOFT) 50 MG tablet Take 1 tablet (50 mg total) by mouth at bedtime. 30 tablet 3   No current facility-administered medications for this visit.    Allergies:   Pollen extract   Social History   Social History  . Marital Status: Married    Spouse Name: N/A  . Number of Children: N/A  . Years of Education: N/A   Social History Main Topics  . Smoking status: Former Research scientist (life sciences)  . Smokeless tobacco: Not on file     Comment: Smoked a pipe age 31->28  . Alcohol Use: No  Comment: Drank from age 43->38  . Drug Use: No  . Sexual Activity: Not on file   Other Topics Concern  . Not on file   Social History Narrative     Family History:  The patient's family history is negative for CAD.   ROS:   Please see the history of present illness.    ROS All other systems reviewed and are negative.   PHYSICAL EXAM:   VS:  BP 111/70 mmHg  Pulse 83  Ht 6' (1.829 m)  Wt 78.926 kg (174 lb)  BMI 23.59 kg/m2   General: Alert, oriented x3, no distress Head: no evidence of trauma, PERRL, EOMI, no exophtalmos or lid lag, no myxedema, no  xanthelasma; normal ears, nose and oropharynx Neck: normal jugular venous pulsations and no hepatojugular reflux; brisk carotid pulses without delay and no carotid bruits Chest: clear to auscultation, no signs of consolidation by percussion or palpation, normal fremitus, symmetrical and full respiratory excursions. Well-healed sternotomy Cardiovascular: normal position and quality of the apical impulse, regular rhythm, normal first and second heart sounds, [id number] Aortic systolic ejection murmur is early peaking, no diastolic murmurs, rubs or gallops Abdomen: no tenderness or distention, no masses by palpation, no abnormal pulsatility or arterial bruits, normal bowel sounds, no hepatosplenomegaly Extremities: no clubbing, cyanosis or edema; 2+ radial, ulnar and brachial pulses bilaterally; 2+ right femoral, posterior tibial and dorsalis pedis pulses; 2+ left femoral, posterior tibial and dorsalis pedis pulses; no subclavian or femoral bruits Neurological: grossly nonfocal, Alert and Oriented x 3, Strength and sensation are intact Psych: depressed mood Skin: warm and dry, no rash    Wt Readings from Last 3 Encounters:  09/10/15 78.926 kg (174 lb)  06/29/15 79.493 kg (175 lb 4 oz)  02/28/15 77.338 kg (170 lb 8 oz)      Studies/Labs Reviewed:   EKG:  EKG is not ordered today.   Recent Labs: 10/18/2014: TSH 1.708 11/25/2014: Magnesium 1.9 12/13/2014: Hemoglobin 12.6*; Platelets 200 02/22/2015: ALT 27; BUN 23; Creatinine, Ser 1.39*; Potassium 4.3; Sodium 141   Lipid Panel No results found for: CHOL, TRIG, HDL, CHOLHDL, VLDL, LDLCALC, LDLDIRECT    ASSESSMENT:    1. Unsteady gait   2. History of stroke   3. Typical atrial flutter ([place])   4. Endocarditis   5. S/P aortic valve repair   6. Coronary artery disease involving native coronary artery of native heart without angina pectoris   7. Depression      PLAN:  In order of problems listed above:   1. Unsteady gait/dizziness: This  could partly be explained as a sequela of his previous embolic stroke during active endocarditis. I think we should refer him to a neurologist. Not sure if repeat head imaging study should be performed and would leave that to the neurologist's recommendation.  There might be a component of orthostatic hypotension, but the only medication that he takes that could explain this is sertraline. This has had a remarkably beneficial effect for his depression (which is a lifelong problem) and I would prefer that he continue it for another 6 months or so.  I encouraged him to liberalize his salt intake and to make sure he stays very well hydrated 2. AFlutter: No recurrence since discontinuing the amiodarone, now on a trivial amount. He also had his left atrial appendage clipped at the time of surgery. If typical cavotricuspid isthmus-dependent atrial flutter recurs, he would be a good candidate for ablation. His strokes were due to vegetations, not arrhythmia.  3. Endocarditis: Reminded of the need for lifelong endocarditis prophylaxis with dental procedures, GI endoscopic procedures that involve biopsy/polypectomy, etc. he is not penicillin allergic. 4. S/P AVR and MV repair: Plan to reassess by echo roughly one year after the previous study 5. CAD: Incidentally discovered, no angina preop. Review lipid profile now that he has recovered fully from surgery and is back to normal nutritional status/activity. 6. Depression: In view of his history of depression for many years continue sertraline for at least 12 months  Medication Adjustments/Labs and Tests Ordered: Current medicines are reviewed at length with the patient today.  Concerns regarding medicines are outlined above.  Medication changes, Labs and Tests ordered today are listed below. Patient Instructions  Medication Instructions: Dr Sallyanne Kuster recommends that you continue on your current medications as directed. Please refer to the Current Medication list  given to you today.  Labwork: Your physician recommends that you return for lab work at your earliest [place].  Testing/Procedures: 1. Echocardiogram in November 2017 - Your physician has requested that you have an echocardiogram. Echocardiography is a painless test that uses sound waves to create images of your heart. It provides your doctor with information about the size and shape of your heart and how well your heart's chambers and valves are working. This procedure takes approximately one hour. There are no restrictions for this procedure.  Follow-up: Dr Sallyanne Kuster recommends that you schedule a follow-up appointment in December 2017. You will receive a reminder letter in the mail two months in advance. If you don't receive a letter, please call our office to schedule the follow-up appointment.  If you need a refill on your cardiac medications before your next appointment, please call your pharmacy.   You have been referred to Dr Margette Fast at [facility] for unsteady gait and history of stoke.      Signed, Sanda Klein, MD  09/10/2015 3:49 PM    [address], [place], [place]  [address] Phone: [phone]; Fax: [phone]

## 2015-09-10 NOTE — Patient Instructions (Signed)
Medication Instructions: Dr Sallyanne Kuster recommends that you continue on your current medications as directed. Please refer to the Current Medication list given to you today.  Labwork: Your physician recommends that you return for lab work at your earliest [place].  Testing/Procedures: 1. Echocardiogram in November 2017 - Your physician has requested that you have an echocardiogram. Echocardiography is a painless test that uses sound waves to create images of your heart. It provides your doctor with information about the size and shape of your heart and how well your heart's chambers and valves are working. This procedure takes approximately one hour. There are no restrictions for this procedure.  Follow-up: Dr Sallyanne Kuster recommends that you schedule a follow-up appointment in December 2017. You will receive a reminder letter in the mail two months in advance. If you don't receive a letter, please call our office to schedule the follow-up appointment.  If you need a refill on your cardiac medications before your next appointment, please call your pharmacy.   You have been referred to Dr Margette Fast at [facility] for unsteady gait and history of stoke.

## 2015-09-12 ENCOUNTER — Ambulatory Visit (INDEPENDENT_AMBULATORY_CARE_PROVIDER_SITE_OTHER): Payer: BC Managed Care – PPO | Admitting: Neurology

## 2015-09-12 ENCOUNTER — Encounter: Payer: Self-pay | Admitting: Neurology

## 2015-09-12 ENCOUNTER — Other Ambulatory Visit: Payer: Self-pay | Admitting: Cardiovascular Disease

## 2015-09-12 VITALS — BP 124/70 | HR 72 | Ht 72.0 in | Wt 176.5 lb

## 2015-09-12 DIAGNOSIS — G2581 Restless legs syndrome: Secondary | ICD-10-CM | POA: Diagnosis not present

## 2015-09-12 DIAGNOSIS — R42 Dizziness and giddiness: Secondary | ICD-10-CM

## 2015-09-12 DIAGNOSIS — G43919 Migraine, unspecified, intractable, without status migrainosus: Secondary | ICD-10-CM | POA: Diagnosis not present

## 2015-09-12 DIAGNOSIS — G43909 Migraine, unspecified, not intractable, without status migrainosus: Secondary | ICD-10-CM

## 2015-09-12 HISTORY — DX: Migraine, unspecified, not intractable, without status migrainosus: G43.909

## 2015-09-12 MED ORDER — TOPIRAMATE 25 MG PO TABS: ORAL_TABLET | ORAL | Status: DC

## 2015-09-12 NOTE — Patient Instructions (Signed)
   Topamax (topiramate) is a seizure medication that has an FDA approval for seizures and for migraine headache. Potential side effects of this medication include weight loss, cognitive slowing, tingling in the fingers and toes, and carbonated drinks will taste bad. If any significant side effects are noted on this drug, please contact our office.  

## 2015-09-12 NOTE — Progress Notes (Signed)
Reason for visit: headache, vertigo  Referring physician: Dr. Margorie Alan [name] is a 65 y.o. male  History of present illness:  Alan [name] is a 66 year old right-handed white male with a history of endocarditis affecting the aortic and mitral valve in August 2016. The patient underwent MRI of the brain around that time that showed evidence of acute bihemispheric small strokes, and a larger left cerebellar stroke that was felt to be cardioembolic. The patient eventually underwent a tissue aortic valve replacement, and a mitral valve repair that was done in September or October 2016. The patient has had some events of atrial flutter associated with this, but this has not been an issue for him recently. The patient has a pre-existing history of migraine headache that is usually associated with tunnel vision, with onset of the headache. The patient has had the addition of vertigo with these events that began after surgery for the heart valves. The episodes become more frequent over time occurring once or twice a week to now occurring daily in nature. The events of vertigo may last 3-5 minutes, with some gradual taper off of severity over the next 20 minutes. The headache may ensue in the bifrontal areas, again lasting 20 or 30 minutes. The patient has phonophobia with the headache, he reports no focal numbness or weakness of the face, arms, or legs. The patient may have occasional transient numbness of the fifth fingers of the hands. He may have some crawly sensations on the legs. He reports some difficulty with focusing during the headache. He denies any ear pain or ringing in the ears or loss of hearing. He is sent to this office for an evaluation.  Past Medical History  Diagnosis Date  . Restless leg syndrome   . Prostate cancer ([place])   . 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 Aortic valve  insufficiency, severe, 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/2014  . 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 Paroxysmal atrial fibrillation ([place]) 10/19/2014  . Paroxysmal atrial flutter ([place]) 10/19/2014  . Enterococcal bacteremia 10/19/2014  . 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 Protein-calorie malnutrition, severe ([place])   . 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 Coronary artery disease involving native coronary artery without angina pectoris 10/23/2014  . Stroke, embolic ([place]) [id number]    Multiple small subclinical embolic infarcts noted on MRI c/w septic emboli  . Hepatitis   . 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/2014    23 mm [facility] Ease bovine pericardial bioprosthetic tissue valve with bovine pericardial patch enlargement of aortic root  . 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 S/P mitral valve repair 11/01/2014    Debridement of vegetation on anterior leaflet  . Migraine 09/12/2015    Past Surgical History  Procedure Laterality Date  . Prostatectomy    . Cardiac catheterization N/A 10/23/2014    Procedure: Right/Left Heart Cath and Coronary Angiography;  Surgeon: Lorretta Harp, MD;  Location: [place] CV LAB;  Service: Cardiovascular;  Laterality: N/A;  . Tee without cardioversion N/A 10/20/2014    Procedure: TRANSESOPHAGEAL ECHOCARDIOGRAM (TEE);  Surgeon: Josue Hector, MD;  Location: [facility] ENDOSCOPY;  Service: Cardiovascular;  Laterality: N/A;  . Colonoscopy N/A 10/25/2014    Procedure: COLONOSCOPY;  Surgeon: Arta Silence, MD;  Location: [facility] ENDOSCOPY;  Service: Endoscopy;  Laterality: N/A;  . Mitral valve repair N/A 11/01/2014  Procedure: MITRAL VALVE REPAIR with no Ring;  Surgeon: Rexene Alberts, MD;  Location: [place];  Service: Open Heart Surgery;  Laterality: N/A;  . Coronary artery bypass graft N/A 11/01/2014    Procedure: CORONARY ARTERY BYPASS GRAFTING (CABG) x 1 using internal mammary artery;  Surgeon: Rexene Alberts, MD;   Location: [place];  Service: Open Heart Surgery;  Laterality: N/A;  . Tee without cardioversion N/A 11/01/2014    Procedure: TRANSESOPHAGEAL ECHOCARDIOGRAM (TEE);  Surgeon: Rexene Alberts, MD;  Location: [place];  Service: Open Heart Surgery;  Laterality: N/A;  . Aortic valve replacement N/A 11/01/2014    Procedure: AORTIC VALVE REPLACEMENT (AVR);  Surgeon: Rexene Alberts, MD;  Location: [place];  Service: Open Heart Surgery;  Laterality: N/A;  . Aortic root enlargement N/A 11/01/2014    Procedure: AORTIC ROOT ENLARGEMENT;  Surgeon: Rexene Alberts, MD;  Location: [place];  Service: Open Heart Surgery;  Laterality: N/A;  . Cardioversion N/A 12/18/2014    Procedure: CARDIOVERSION;  Surgeon: Pixie Casino, MD;  Location: [facility] ENDOSCOPY;  Service: Cardiovascular;  Laterality: N/A;  . Cardioversion N/A 02/06/2015    Procedure: CARDIOVERSION;  Surgeon: Larey Dresser, MD;  Location: [facility] ENDOSCOPY;  Service: Cardiovascular;  Laterality: N/A;    Family History  Problem Relation Age of Onset  . Stroke Father     Social history:  reports that he has quit smoking. He does not have any smokeless tobacco history on file. He reports that he does not drink alcohol or use illicit drugs.  Medications:  Prior to Admission medications   Medication Sig Start Date End Date Taking? Authorizing Provider  aspirin-acetaminophen-caffeine (EXCEDRIN MIGRAINE) [id number] MG tablet Take 2 tablets by mouth daily as needed for migraine.    Historical Provider, MD  Flaxseed, Linseed, (FLAXSEED OIL PO) Take 1 capsule by mouth 2 (two) times daily.    Historical Provider, MD  Multiple Vitamins-Minerals (PRESERVISION AREDS 2) CAPS Take 1 capsule by mouth 2 (two) times daily.    Historical Provider, MD  pramipexole (MIRAPEX) 1.5 MG tablet Take 1.5 mg by mouth at bedtime.    Historical Provider, MD  sertraline (ZOLOFT) 50 MG tablet Take 1 tablet (50 mg total) by mouth at bedtime. 02/28/15   Sanda Klein, MD      Allergies  Allergen  Reactions  . Pollen Extract Other (See Comments)    sneezing    ROS:  Out of a complete 14 system review of symptoms, the patient complains only of the following symptoms, and all other reviewed systems are negative.  Blurred vision Vertigo Headache, dizziness Restless legs  Blood pressure 124/70, pulse 72, height 6' (1.829 m), weight 176 lb 8 oz (80.06 kg).  Physical Exam  General: The patient is alert and cooperative at the time of the examination.  Eyes: Pupils are equal, round, and reactive to light. Discs are flat bilaterally.  Neck: The neck is supple, no carotid bruits are noted.  Respiratory: The respiratory examination is clear.  Cardiovascular: The cardiovascular examination reveals a regular rate and rhythm, a grade I/VI systolic murmur is noted at the aortic area.  Skin: Extremities are without significant edema.  Neurologic Exam  Mental status: The patient is alert and oriented x 3 at the time of the examination. The patient has apparent normal recent and remote memory, with an apparently normal attention span and concentration ability.  Cranial nerves: Facial symmetry is present. There is good sensation of the face to pinprick and soft  touch bilaterally. The strength of the facial muscles and the muscles to head turning and shoulder shrug are normal bilaterally. Speech is well enunciated, no aphasia or dysarthria is noted. Extraocular movements are full. Visual fields are full. The tongue is midline, and the patient has symmetric elevation of the soft palate. No obvious hearing deficits are noted.  Motor: The motor testing reveals 5 over 5 strength of all 4 extremities. Good symmetric motor tone is noted throughout.  Sensory: Sensory testing is intact to pinprick, soft touch, vibration sensation, and position sense on all 4 extremities, with the exception of some decrease in vibration sensation of the left foot as compared to the right. No evidence of extinction is  noted.  Coordination: Cerebellar testing reveals good finger-nose-finger and heel-to-shin bilaterally.  Gait and station: Gait is normal. Tandem gait is unsteady. Romberg is negative. No drift is seen.  Reflexes: Deep tendon reflexes are symmetric and normal bilaterally. Toes are downgoing bilaterally.   MRI brain 10/20/15:  IMPRESSION: 1. Scattered small acute to subacute, and occasional early chronic, lacunar infarcts in both MCA territories and the left cerebellum. None of these appear hemorrhagic (cerebral septic emboli are prone to bleed), but consider sequelae of endocarditis and septic emboli in this setting. No significant edema or mass effect. 2. Chronic anterior division left MCA infarct. A small number of chronic micro hemorrhages are identified in both hemispheres an the left cerebellum. 3. Mild for age intracranial atherosclerosis with no stenosis or branch occlusion identified. Mild intracranial artery Dolichoectasia.  * MRI scan images were reviewed online. I agree with the written report.    Assessment/Plan:  1. History of endocarditis, bihemispheric embolic strokes  2. Episodic vertigo, migraine headache  The patient appears to be having episodes consistent with migraine at this time. The patient has a two-year history of migraine that predates the episode of endocarditis, but the vertigo is a new addition to the symptoms associated with migraine. The patient will be sent for MRI of the brain, he will be placed on Topamax, working up on the dose. He will follow-up in about 4 months. The patient is not a candidate for triptan medications given the stroke events and the history of coronary artery disease. The patient has taken some Advil and Excedrin Migraine for the headache with some benefit.  Jill Alexanders MD 09/12/2015 7:52 PM  [facility] [address] [place], [place] [address]  Phone [phone] Fax [phone]

## 2015-09-13 ENCOUNTER — Other Ambulatory Visit: Payer: Self-pay | Admitting: *Deleted

## 2015-09-13 ENCOUNTER — Other Ambulatory Visit: Payer: Self-pay

## 2015-09-13 MED ORDER — SERTRALINE HCL 50 MG PO TABS: 50.0000 mg | ORAL_TABLET | Freq: Every day | ORAL | Status: DC

## 2015-09-13 NOTE — Telephone Encounter (Signed)
Rx(s) sent to pharmacy electronically.  

## 2015-09-17 NOTE — Telephone Encounter (Signed)
left message for patient that he does not need his echo that is scheduled for august 1 until November.  I am cancelling august 1 appt and asked that patient call back to confirm he received this message.

## 2015-09-19 ENCOUNTER — Telehealth: Payer: Self-pay | Admitting: Neurology

## 2015-09-19 NOTE — Telephone Encounter (Signed)
Patient returned call regarding MRI appointment.

## 2015-09-25 ENCOUNTER — Other Ambulatory Visit (HOSPITAL_COMMUNITY): Payer: BC Managed Care – PPO

## 2015-09-26 ENCOUNTER — Ambulatory Visit (INDEPENDENT_AMBULATORY_CARE_PROVIDER_SITE_OTHER): Payer: BC Managed Care – PPO

## 2015-09-26 DIAGNOSIS — R42 Dizziness and giddiness: Secondary | ICD-10-CM

## 2015-09-26 DIAGNOSIS — G43919 Migraine, unspecified, intractable, without status migrainosus: Secondary | ICD-10-CM

## 2015-09-26 DIAGNOSIS — G2581 Restless legs syndrome: Secondary | ICD-10-CM

## 2015-09-26 NOTE — Telephone Encounter (Signed)
Patient has been scheduled

## 2015-09-28 ENCOUNTER — Telehealth: Payer: Self-pay | Admitting: Neurology

## 2015-09-28 MED ORDER — TOPIRAMATE 100 MG PO TABS
100.0000 mg | ORAL_TABLET | Freq: Every day | ORAL | 3 refills | Status: DC
Start: 2015-09-28 — End: 2015-11-19

## 2015-09-28 NOTE — Telephone Encounter (Signed)
I called the patient. The MRI of the brain shows chronic strokes, but the occipital infarcts were not there in August of 2016. I suspect these events occurred between August and October of 2016 when he got the valve replacement. He is to stay on the topamax for now, call if there are problems.   MRI brain 09/27/15:  IMPRESSION:  Abnormal MRI brain (without) demonstrating: 1. Chronic ischemic infarcts noted in the right cerebellum, bilateral occipital, bilateral frontal and bilateral parietal regions.  2. Chronic cerebral microhemorrhages vs dystrophic calcifications in the right frontal, left occipital and left cerebellar regions. 3. No acute findings. 4. Compared to MRI on 10/20/14, there are new chronic-appearing bilateral occipital infarcts.

## 2015-09-28 NOTE — Telephone Encounter (Signed)
I called the patient , I reviewed the MRI results with him again. The patient is on Topamax  oh grams at night, there may have been a slight change in his headache frequency, some of his staggery episodes have reduced. He is tolerating the medication well, we will go up to 100 mg at night, I will call I the 100 mg tablet.

## 2015-09-28 NOTE — Addendum Note (Signed)
Addended by: Margette Fast on: 09/28/2015 01:21 PM   Modules accepted: Orders

## 2015-09-28 NOTE — Telephone Encounter (Signed)
Pt called in after getting a call from Dr. Jannifer Franklin. He would like a call back to go over results. Please call around 1030 or 11. Pt is having a root canal done this morning. [phone]

## 2015-09-28 NOTE — Telephone Encounter (Signed)
Attempted to reach pt to go over results and answer questions but w/o success. Left VM mssg (per DPR) w/ results. May return call on Monday w/ questions/concerns.

## 2015-11-19 ENCOUNTER — Ambulatory Visit: Payer: BC Managed Care – PPO | Admitting: Thoracic Surgery (Cardiothoracic Vascular Surgery)

## 2015-11-19 ENCOUNTER — Other Ambulatory Visit: Payer: Self-pay

## 2015-11-19 MED ORDER — SERTRALINE HCL 50 MG PO TABS: 50.0000 mg | ORAL_TABLET | Freq: Every day | ORAL | 3 refills | Status: DC

## 2015-11-19 MED ORDER — TOPIRAMATE 100 MG PO TABS: 100.0000 mg | ORAL_TABLET | Freq: Every day | ORAL | 3 refills | Status: DC

## 2015-11-19 NOTE — Telephone Encounter (Signed)
90 day refills e-scribed per faxed request from [facility] pharmacy.

## 2016-01-13 ENCOUNTER — Other Ambulatory Visit: Payer: Self-pay | Admitting: Neurology

## 2016-01-25 ENCOUNTER — Telehealth: Payer: Self-pay

## 2016-01-25 ENCOUNTER — Ambulatory Visit: Payer: BC Managed Care – PPO | Admitting: Neurology

## 2016-01-25 NOTE — Telephone Encounter (Signed)
Pt no-showed today's follow-up appt.

## 2016-01-31 ENCOUNTER — Encounter: Payer: Self-pay | Admitting: Neurology

## 2016-02-11 ENCOUNTER — Telehealth (HOSPITAL_COMMUNITY): Payer: Self-pay | Admitting: Cardiovascular Disease

## 2016-02-11 NOTE — Telephone Encounter (Signed)
Pt has been contacted a few times(11/16, 11/29 and 12/18) to get his Echo scheduled and have yet to receive a call back. Dr. Sallyanne Kuster 's nurse has been notified and he will be removed from the workqueue. If in fact the patient does call back the test will be reordered by the ordering physician.

## 2016-03-06 ENCOUNTER — Encounter: Payer: Self-pay | Admitting: Cardiovascular Disease

## 2016-03-06 ENCOUNTER — Ambulatory Visit (INDEPENDENT_AMBULATORY_CARE_PROVIDER_SITE_OTHER): Payer: Medicare Other | Admitting: Cardiovascular Disease

## 2016-03-06 VITALS — BP 122/76 | HR 67 | Ht 72.0 in | Wt 167.2 lb

## 2016-03-06 DIAGNOSIS — I483 Typical atrial flutter: Secondary | ICD-10-CM

## 2016-03-06 DIAGNOSIS — I2510 Atherosclerotic heart disease of native coronary artery without angina pectoris: Secondary | ICD-10-CM

## 2016-03-06 DIAGNOSIS — Z79899 Other long term (current) drug therapy: Secondary | ICD-10-CM | POA: Diagnosis not present

## 2016-03-06 DIAGNOSIS — E785 Hyperlipidemia, unspecified: Secondary | ICD-10-CM

## 2016-03-06 DIAGNOSIS — Z953 Presence of xenogenic heart valve: Secondary | ICD-10-CM

## 2016-03-06 LAB — LIPID PANEL
Cholesterol: 154 mg/dL (ref <200)
HDL: 45 mg/dL (ref >40)
LDL Cholesterol: 95 mg/dL (ref <100)
TRIGLYCERIDES: 71 mg/dL (ref <150)
Total CHOL/HDL Ratio: 3.4 Ratio (ref <5.0)
VLDL: 14 mg/dL (ref <30)

## 2016-03-06 LAB — COMPREHENSIVE METABOLIC PANEL
ALBUMIN: 4.1 g/dL (ref 3.6–5.1)
ALK PHOS: 60 U/L (ref 40–115)
ALT: 22 U/L (ref 9–46)
AST: 26 U/L (ref 10–35)
BUN: 21 mg/dL (ref 7–25)
CHLORIDE: 110 mmol/L (ref 98–110)
CO2: 24 mmol/L (ref 20–31)
CREATININE: 1.18 mg/dL (ref 0.70–1.25)
Calcium: 9.4 mg/dL (ref 8.6–10.3)
Glucose, Bld: 93 mg/dL (ref 65–99)
Potassium: 4.2 mmol/L (ref 3.5–5.3)
SODIUM: 141 mmol/L (ref 135–146)
TOTAL PROTEIN: 6.9 g/dL (ref 6.1–8.1)
Total Bilirubin: 0.8 mg/dL (ref 0.2–1.2)

## 2016-03-06 MED ORDER — NITROGLYCERIN 0.4 MG SL SUBL: 0.4000 mg | SUBLINGUAL_TABLET | SUBLINGUAL | 3 refills | Status: DC | PRN

## 2016-03-06 NOTE — Progress Notes (Signed)
Patient ID: Alan [name], male   DOB: Jul 28, 1950, 66 y.o.   MRN: [id number] Patient ID: Alan [name], male   DOB: 09-Aug-1950, 66 y.o.   MRN: [id number]    Cardiology Office Note    Date:  03/06/2016   ID:  Alan [name], DOB 01-11-1951, MRN [id number]  PCP:  [name], MD  Cardiologist:   Sanda Klein, MD   Chief Complaint  Patient presents with  . Follow-up    pt c/o dizziness, migraines , left side numbness (cold)    History of Present Illness:  Alan [name] is a 66 y.o. 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 He underwent aortic valve replacement with a biological prosthesis and mitral 5 repair as well as a single-vessel LIMA to LAD bypass for incidentally discovered CAD. He also had a 90% stenosis in the distal segment of the left PDA that was not bypassed  He presents today with an unusual complaint of a sensation of cold and numbness that enveloped the left side of his chest left upper extremity and left side of his neck it was quite unpleasant and lasted for about 30 minutes. It occurred at rest without obvious provocation and resolve spontaneously. He did not appear to be associated with changes in position or a meal. It has never occurred before and has not returned since. There was no associated pain/tightness or any other type of discomfort in his chest. He has also noticed increase frequency of headaches. He is anxious about his financial situation and wants to avoid being a burden for his wife Alan [name]. [name] has had a lifelong struggle with depression. He is working at [employer], a mildly to moderately physically strenuous job.  On November 01, 2014, he underwent AVR with a bioprosthesis (23 mm MagnaEase),patch enlargement of the aortic root (Nicks' technique) and MV  repair without annuloplasty ring, as well as single-vessel LIMA to LAD bypass for incidentally discovered CAD and clipping of the left atrial appendage (40 mm Atricure clip). Postop atrial flutter led to treatment with amiodarone and cardioversion on October 24 and a second cardioversion on January 11, 2015 . Since that time all antiarrhythmics have been discontinued without recurrence of arrhythmia. He is not on anticoagulants in fact, she does not take any cardiac meds at all  Echo 01/23/15 - Left ventricle: The cavity size was normal. There was severe concentric hypertrophy. Systolic function was normal. Theestimated ejection fraction was in the range of 60% to 65%. Wallmotion was normal; there were no regional wall motionabnormalities. The study is not technically sufficient to allowevaluation of LV diastolic function. - Aortic valve: A bioprosthesis was present. The sewing ringappeared normal. Transvalvular velocity was within the normalrange. There was no stenosis. Peak velocity (S): 197 cm/s. Meangradient (S): 8 mm Hg. - Mitral valve: Prior procedures included surgical repair, thoughno surgical ring is appreciated. The findings are consistent withmild stenosis. There was mild regurgitation. Mean gradient (D): 10mm Hg. - Left atrium: The atrium was severely dilated. - Right ventricle: The cavity size was normal. Wall thickness wasnormal. Systolic function was moderately reduced. - Right atrium: The atrium was severely dilated. - Tricuspid valve: There was mild regurgitation. - Pulmonary arteries: PA peak pressure: 37 mm Hg (S).  CATH 10/23/2014  Ost LAD lesion, 60% stenosed.  Prox LAD to Mid LAD lesion, 70% stenosed.  Ost LPDA to LPDA  lesion, 90% stenosed.  Ost 2nd Mrg to 2nd Mrg lesion, 70% stenosed.  2nd Mrg lesion, 50% stenosed.     Past Medical History:  Diagnosis Date  . 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 Aortic valve insufficiency, severe,  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/2014  . Coronary artery disease involving native coronary artery without angina pectoris 10/23/2014  . 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 Enterococcal bacteremia 10/19/2014  . Hepatitis   . Migraine 09/12/2015  . 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 Paroxysmal atrial fibrillation ([place]) 10/19/2014  . Paroxysmal atrial flutter ([place]) 10/19/2014  . Prostate cancer ([place])   . Protein-calorie malnutrition, severe ([place])   . Restless leg syndrome   . 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/2014   23 mm [facility] Ease bovine pericardial bioprosthetic tissue valve with bovine pericardial patch enlargement of aortic root  . 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 S/P mitral valve repair 11/01/2014   Debridement of vegetation on anterior leaflet  . 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 AORTIC ROOT ENLARGEMENT N/A 11/01/2014   Procedure: AORTIC ROOT ENLARGEMENT;  Surgeon: Rexene Alberts, MD;  Location: [place];  Service: Open Heart Surgery;  Laterality: N/A;  . AORTIC VALVE REPLACEMENT N/A 11/01/2014   Procedure: AORTIC VALVE REPLACEMENT (AVR);  Surgeon: Rexene Alberts, MD;  Location: [place];  Service: Open Heart Surgery;  Laterality: N/A;  . CARDIAC CATHETERIZATION N/A 10/23/2014   Procedure: Right/Left Heart Cath and Coronary Angiography;  Surgeon: Lorretta Harp, MD;  Location: [place] CV LAB;  Service: Cardiovascular;  Laterality: N/A;  . CARDIOVERSION N/A 12/18/2014   Procedure: CARDIOVERSION;  Surgeon: Pixie Casino, MD;  Location[facility] ENDOSCOPY;  Service: Cardiovascular;  Laterality: N/A;  . CARDIOVERSION N/A 02/06/2015   Procedure: CARDIOVERSION;  Surgeon: Larey Dresser, MD;  Location: [place];  Service: Cardiovascular;  Laterality: N/A;  . COLONOSCOPY N/A 10/25/2014   Procedure: COLONOSCOPY;  Surgeon: Arta Silence, MD;  Location: [facility] ENDOSCOPY;  Service: Endoscopy;  Laterality: N/A;  . CORONARY ARTERY BYPASS GRAFT N/A 11/01/2014   Procedure: CORONARY ARTERY BYPASS GRAFTING (CABG) x 1 using internal mammary artery;  Surgeon: Rexene Alberts, MD;  Location: [place];  Service: Open Heart Surgery;  Laterality: N/A;  . MITRAL VALVE REPAIR N/A 11/01/2014   Procedure: MITRAL VALVE REPAIR with no Ring;  Surgeon: Rexene Alberts, MD;  Location: [place];  Service: Open Heart Surgery;  Laterality: N/A;  . PROSTATECTOMY    . TEE WITHOUT CARDIOVERSION N/A 10/20/2014   Procedure: TRANSESOPHAGEAL ECHOCARDIOGRAM (TEE);  Surgeon: Josue Hector, MD;  Location: [place];  Service: Cardiovascular;  Laterality: N/A;  . TEE WITHOUT CARDIOVERSION N/A 11/01/2014   Procedure: TRANSESOPHAGEAL ECHOCARDIOGRAM (TEE);  Surgeon: Rexene Alberts, MD;  Location: [place];  Service: Open Heart Surgery;  Laterality: N/A;    Current Outpatient Prescriptions  Medication Sig Dispense Refill  . aspirin-acetaminophen-caffeine (EXCEDRIN MIGRAINE) 250-250-65 MG tablet Take 2 tablets by mouth daily as needed for migraine.    . Multiple Vitamins-Minerals (PRESERVISION AREDS 2) CAPS Take 1 capsule by mouth 2 (two) times daily.    . pramipexole (MIRAPEX) 1.5 MG tablet Take 1.5 mg by mouth at bedtime.    Marland Kitchen  sertraline (ZOLOFT) 50 MG tablet Take 1 tablet (50 mg total) by mouth at bedtime. 30 tablet 3  . topiramate (TOPAMAX) 100 MG tablet Take 1 tablet (100 mg total) by mouth at bedtime. 90 tablet 3  . nitroGLYCERIN (NITROSTAT) 0.4 MG SL tablet Place 1 tablet (0.4 mg total) under the tongue every 5 (five) minutes as needed for chest pain. 25 tablet 3   No current facility-administered medications for this visit.     Allergies:   Pollen extract   Social History   Social History  . Marital status: Married    Spouse name:  N/A  . Number of children: 1  . Years of education: Master's   Occupational History  . [employer]   Social History Main Topics  . Smoking status: Former Research scientist (life sciences)  . Smokeless tobacco: None     Comment: Smoked a pipe age 50->28  . Alcohol use No     Comment: Drank from age 21->38  . Drug use: No  . Sexual activity: Not Asked   Other Topics Concern  . None   Social History Narrative   Lives at home w/ his wife   Right-handed   Caffeine: 2 cups daily     Family History:  The patient's family history includes Stroke in his father.   ROS:   Please see the history of present illness.    ROS All other systems reviewed and are negative.   PHYSICAL EXAM:   VS:  BP 122/76   Pulse 67   Ht 6' (1.829 m)   Wt 75.8 kg (167 lb 3.2 oz)   BMI 22.68 kg/m    General: Alert, oriented x3, no distress Head: no evidence of trauma, PERRL, EOMI, no exophtalmos or lid lag, no myxedema, no xanthelasma; normal ears, nose and oropharynx Neck: normal jugular venous pulsations and no hepatojugular reflux; brisk carotid pulses without delay and no carotid bruits Chest: clear to auscultation, no signs of consolidation by percussion or palpation, normal fremitus, symmetrical and full respiratory excursions. Well-healed sternotomy Cardiovascular: normal position and quality of the apical impulse, regular rhythm, normal first and second heart sounds, [id number] Aortic systolic ejection murmur is early peaking, no diastolic murmurs, rubs or gallops Abdomen: no tenderness or distention, no masses by palpation, no abnormal pulsatility or arterial bruits, normal bowel sounds, no hepatosplenomegaly Extremities: no clubbing, cyanosis or edema; 2+ radial, ulnar and brachial pulses bilaterally; 2+ right femoral, posterior tibial and dorsalis pedis pulses; 2+ left femoral, posterior tibial and dorsalis pedis pulses; no subclavian or femoral bruits Neurological: grossly nonfocal, Alert and Oriented x 3, Strength and  sensation are intact Psych: depressed mood  Skin: warm and dry, no rash    Wt Readings from Last 3 Encounters:  03/06/16 75.8 kg (167 lb 3.2 oz)  09/12/15 80.1 kg (176 lb 8 oz)  09/10/15 78.9 kg (174 lb)      Studies/Labs Reviewed:   EKG:  EKG is not ordered today.   Recent Labs: 03/06/2016: ALT 22; BUN 21; Creat 1.18; Potassium 4.2; Sodium 141   Lipid Panel    Component Value Date/Time   CHOL 154 03/06/2016 0954   TRIG 71 03/06/2016 0954   HDL 45 03/06/2016 0954   CHOLHDL 3.4 03/06/2016 0954   VLDL 14 03/06/2016 0954   LDLCALC 95 03/06/2016 0954      ASSESSMENT:    1. Coronary artery disease involving native coronary artery of native heart without angina pectoris   2. Typical atrial flutter ([place])  3. History of aortic valve replacement with bioprosthetic valve   4. Dyslipidemia   5. Medication management      PLAN:  In order of problems listed above:   1. CAD/Abnormal chest sensation: This sounds highly atypical for angina pectoris, despite its location. When he walks with his wife he has no symptoms of the kind. He did not have any angina before his endocarditis and surgery. He does have non revascularized lesions in small vessel such as his PDA and OM 2 and could have ischemic myocardium.  Alternatively, his symptoms of focal numbness/cold sensation might be be explained as a sequela of his previous embolic strokes and might be better evaluated by a neurologist.  I went ahead and gave him a prescription for sublingual nitroglycerin and instructed him in its proper and safe use. If she has the recurrent abnormal sensation in his chest and it is abbreviated/stopped by sublingual nitroglycerin we might just treat him with long-acting nitrates. There does not appear to be any compelling reason to proceed to coronary angiography. I considered a nuclear stress test but it would really not change our management approach since it is likely to confirm ischemia in the known  territories of non-revascularized coronary stenosis. He also wants to avoid costly workup. Reviewed his lipid profile ordered today: acceptable, albeit not ideal. He prefers not to take additional medications. He eats a very healthy diet 2. AFlutter: No recurrence since discontinuing the amiodarone, a year ago. He also had his left atrial appendage clipped at the time of surgery. If typical cavotricuspid isthmus-dependent atrial flutter recurs, he would be a good candidate for ablation. His strokes were due to vegetations, not arrhythmia. Anticoagulants have been discontinued. 3. S/P AVR and MV repair: Follow-up echo is not urgent. We'll delay in view of his financial concerns. Reminded of the need for lifelong endocarditis prophylaxis with dental procedures, GI endoscopic procedures that involve biopsy/polypectomy, etc. He is not penicillin allergic. 4. Depression: In view of his history of depression for many years continue sertraline. His current financial issues are not helping.  Medication Adjustments/Labs and Tests Ordered: Current medicines are reviewed at length with the patient today.  Concerns regarding medicines are outlined above.  Medication changes, Labs and Tests ordered today are listed below. Patient Instructions  Medication Instructions: Dr Sallyanne Kuster has recommended making the following medication changes: 1. USE sublingual nitroglycerin - information below  Labwork: Your physician recommends that you return for lab work at your convenience - FASTING.  Testing/Procedures: NONE ORDERED  Follow-up: Dr Sallyanne Kuster recommends that you schedule a follow-up appointment in 3 months.  If you need a refill on your cardiac medications before your next appointment, please call your pharmacy.   Nitroglycerin sublingual tablets What is this medicine? NITROGLYCERIN (nye troe GLI ser in) is a type of vasodilator. It relaxes blood vessels, increasing the blood and oxygen supply to your  heart. This medicine is used to relieve chest pain caused by angina. It is also used to prevent chest pain before activities like climbing stairs, going outdoors in cold weather, or sexual activity. This medicine may be used for other purposes; ask your health care provider or pharmacist if you have questions. COMMON BRAND NAME(S): Nitroquick, Nitrostat, Nitrotab What should I tell my health care provider before I take this medicine? They need to know if you have any of these conditions: -anemia -head injury, recent stroke, or bleeding in the brain -liver disease -previous heart attack -an unusual or allergic reaction to nitroglycerin, other  medicines, foods, dyes, or preservatives -pregnant or trying to get pregnant -breast-feeding How should I use this medicine? Take this medicine by mouth as needed. At the first sign of an angina attack (chest pain or tightness) place one tablet under your tongue. You can also take this medicine 5 to 10 minutes before an event likely to produce chest pain. Follow the directions on the prescription label. Let the tablet dissolve under the tongue. Do not swallow whole. Replace the dose if you accidentally swallow it. It will help if your mouth is not dry. Saliva around the tablet will help it to dissolve more quickly. Do not eat or drink, smoke or chew tobacco while a tablet is dissolving. If you are not better within 5 minutes after taking ONE dose of nitroglycerin, call 9-1-1 immediately to seek emergency medical care. Do not take more than 3 nitroglycerin tablets over 15 minutes. If you take this medicine often to relieve symptoms of angina, your doctor or health care professional may provide you with different instructions to manage your symptoms. If symptoms do not go away after following these instructions, it is important to call 9-1-1 immediately. Do not take more than 3 nitroglycerin tablets over 15 minutes. Talk to your pediatrician regarding the use of  this medicine in children. Special care may be needed. Overdosage: If you think you have taken too much of this medicine contact a poison control center or emergency room at once. NOTE: This medicine is only for you. Do not share this medicine with others. What if I miss a dose? This does not apply. This medicine is only used as needed. What may interact with this medicine? Do not take this medicine with any of the following medications: -certain migraine medicines like ergotamine and dihydroergotamine (DHE) -medicines used to treat erectile dysfunction like sildenafil, tadalafil, and vardenafil -riociguat This medicine may also interact with the following medications: -alteplase -aspirin -heparin -medicines for high blood pressure -medicines for mental depression -other medicines used to treat angina -phenothiazines like chlorpromazine, mesoridazine, prochlorperazine, thioridazine This list may not describe all possible interactions. Give your health care provider a list of all the medicines, herbs, non-prescription drugs, or dietary supplements you use. Also tell them if you smoke, drink alcohol, or use illegal drugs. Some items may interact with your medicine. What should I watch for while using this medicine? Tell your doctor or health care professional if you feel your medicine is no longer working. Keep this medicine with you at all times. Sit or lie down when you take your medicine to prevent falling if you feel dizzy or faint after using it. Try to remain calm. This will help you to feel better faster. If you feel dizzy, take several deep breaths and lie down with your feet propped up, or bend forward with your head resting between your knees. You may get drowsy or dizzy. Do not drive, use machinery, or do anything that needs mental alertness until you know how this drug affects you. Do not stand or sit up quickly, especially if you are an older patient. This reduces the risk of dizzy  or fainting spells. Alcohol can make you more drowsy and dizzy. Avoid alcoholic drinks. Do not treat yourself for coughs, colds, or pain while you are taking this medicine without asking your doctor or health care professional for advice. Some ingredients may increase your blood pressure. What side effects may I notice from receiving this medicine? Side effects that you should report to your doctor  or health care professional as soon as possible: -blurred vision -dry mouth -skin rash -sweating -the feeling of extreme pressure in the head -unusually weak or tired Side effects that usually do not require medical attention (report to your doctor or health care professional if they continue or are bothersome): -flushing of the face or neck -headache -irregular heartbeat, palpitations -nausea, vomiting This list may not describe all possible side effects. Call your doctor for medical advice about side effects. You may report side effects to FDA at 1-800-FDA-1088. Where should I keep my medicine? Keep out of the reach of children. Store at room temperature between 20 and 25 degrees C (68 and 77 degrees F). Store in [employer]. Protect from light and moisture. Keep tightly closed. Throw away any unused medicine after the expiration date. NOTE: This sheet is a summary. It may not cover all possible information. If you have questions about this medicine, talk to your doctor, pharmacist, or health care provider.  2017 Elsevier/Gold Standard (2012-12-09 17:57:36)      Signed, Sanda Klein, MD  03/06/2016 3:04 PM    [facility] [place], [place], [place]  [address] Phone: [phone]; Fax: [phone]

## 2016-03-06 NOTE — Patient Instructions (Signed)
Medication Instructions: Dr Sallyanne Kuster has recommended making the following medication changes: 1. USE sublingual nitroglycerin - information below  Labwork: Your physician recommends that you return for lab work at your convenience - FASTING.  Testing/Procedures: NONE ORDERED  Follow-up: Dr Sallyanne Kuster recommends that you schedule a follow-up appointment in 3 months.  If you need a refill on your cardiac medications before your next appointment, please call your pharmacy.   Nitroglycerin sublingual tablets What is this medicine? NITROGLYCERIN (nye troe GLI ser in) is a type of vasodilator. It relaxes blood vessels, increasing the blood and oxygen supply to your heart. This medicine is used to relieve chest pain caused by angina. It is also used to prevent chest pain before activities like climbing stairs, going outdoors in cold weather, or sexual activity. This medicine may be used for other purposes; ask your health care provider or pharmacist if you have questions. COMMON BRAND NAME(S): Nitroquick, Nitrostat, Nitrotab What should I tell my health care provider before I take this medicine? They need to know if you have any of these conditions: -anemia -head injury, recent stroke, or bleeding in the brain -liver disease -previous heart attack -an unusual or allergic reaction to nitroglycerin, other medicines, foods, dyes, or preservatives -pregnant or trying to get pregnant -breast-feeding How should I use this medicine? Take this medicine by mouth as needed. At the first sign of an angina attack (chest pain or tightness) place one tablet under your tongue. You can also take this medicine 5 to 10 minutes before an event likely to produce chest pain. Follow the directions on the prescription label. Let the tablet dissolve under the tongue. Do not swallow whole. Replace the dose if you accidentally swallow it. It will help if your mouth is not dry. Saliva around the tablet will help it to  dissolve more quickly. Do not eat or drink, smoke or chew tobacco while a tablet is dissolving. If you are not better within 5 minutes after taking ONE dose of nitroglycerin, call 9-1-1 immediately to seek emergency medical care. Do not take more than 3 nitroglycerin tablets over 15 minutes. If you take this medicine often to relieve symptoms of angina, your doctor or health care professional may provide you with different instructions to manage your symptoms. If symptoms do not go away after following these instructions, it is important to call 9-1-1 immediately. Do not take more than 3 nitroglycerin tablets over 15 minutes. Talk to your pediatrician regarding the use of this medicine in children. Special care may be needed. Overdosage: If you think you have taken too much of this medicine contact a poison control center or emergency room at once. NOTE: This medicine is only for you. Do not share this medicine with others. What if I miss a dose? This does not apply. This medicine is only used as needed. What may interact with this medicine? Do not take this medicine with any of the following medications: -certain migraine medicines like ergotamine and dihydroergotamine (DHE) -medicines used to treat erectile dysfunction like sildenafil, tadalafil, and vardenafil -riociguat This medicine may also interact with the following medications: -alteplase -aspirin -heparin -medicines for high blood pressure -medicines for mental depression -other medicines used to treat angina -phenothiazines like chlorpromazine, mesoridazine, prochlorperazine, thioridazine This list may not describe all possible interactions. Give your health care provider a list of all the medicines, herbs, non-prescription drugs, or dietary supplements you use. Also tell them if you smoke, drink alcohol, or use illegal drugs. Some items may interact  with your medicine. What should I watch for while using this medicine? Tell your  doctor or health care professional if you feel your medicine is no longer working. Keep this medicine with you at all times. Sit or lie down when you take your medicine to prevent falling if you feel dizzy or faint after using it. Try to remain calm. This will help you to feel better faster. If you feel dizzy, take several deep breaths and lie down with your feet propped up, or bend forward with your head resting between your knees. You may get drowsy or dizzy. Do not drive, use machinery, or do anything that needs mental alertness until you know how this drug affects you. Do not stand or sit up quickly, especially if you are an older patient. This reduces the risk of dizzy or fainting spells. Alcohol can make you more drowsy and dizzy. Avoid alcoholic drinks. Do not treat yourself for coughs, colds, or pain while you are taking this medicine without asking your doctor or health care professional for advice. Some ingredients may increase your blood pressure. What side effects may I notice from receiving this medicine? Side effects that you should report to your doctor or health care professional as soon as possible: -blurred vision -dry mouth -skin rash -sweating -the feeling of extreme pressure in the head -unusually weak or tired Side effects that usually do not require medical attention (report to your doctor or health care professional if they continue or are bothersome): -flushing of the face or neck -headache -irregular heartbeat, palpitations -nausea, vomiting This list may not describe all possible side effects. Call your doctor for medical advice about side effects. You may report side effects to FDA at 1-800-FDA-1088. Where should I keep my medicine? Keep out of the reach of children. Store at room temperature between 20 and 25 degrees C (68 and 77 degrees F). Store in [employer]. Protect from light and moisture. Keep tightly closed. Throw away any unused medicine after the  expiration date. NOTE: This sheet is a summary. It may not cover all possible information. If you have questions about this medicine, talk to your doctor, pharmacist, or health care provider.  2017 Elsevier/Gold Standard (2012-12-09 17:57:36)

## 2016-07-28 ENCOUNTER — Telehealth: Payer: Self-pay | Admitting: Cardiovascular Disease

## 2016-07-28 ENCOUNTER — Other Ambulatory Visit: Payer: Self-pay | Admitting: Cardiovascular Disease

## 2016-07-28 NOTE — Telephone Encounter (Signed)
LMTCB

## 2016-07-28 NOTE — Telephone Encounter (Signed)
New message    Pt is calling about the coverage of his prescription. He is asking for a call back.

## 2016-07-29 NOTE — Telephone Encounter (Signed)
Left message for pt to call.

## 2016-07-30 NOTE — Telephone Encounter (Signed)
Lmtcb.

## 2016-11-30 IMAGING — CR DG CHEST 2V
2 series · 2 of 2 positions shown · non-contrast
Comparison: 08/08/2008

CLINICAL DATA: Fatigue, fever, and weakness for 3 weeks. History of
prostate cancer.

EXAM:
CHEST  2 VIEW

[chest pa]
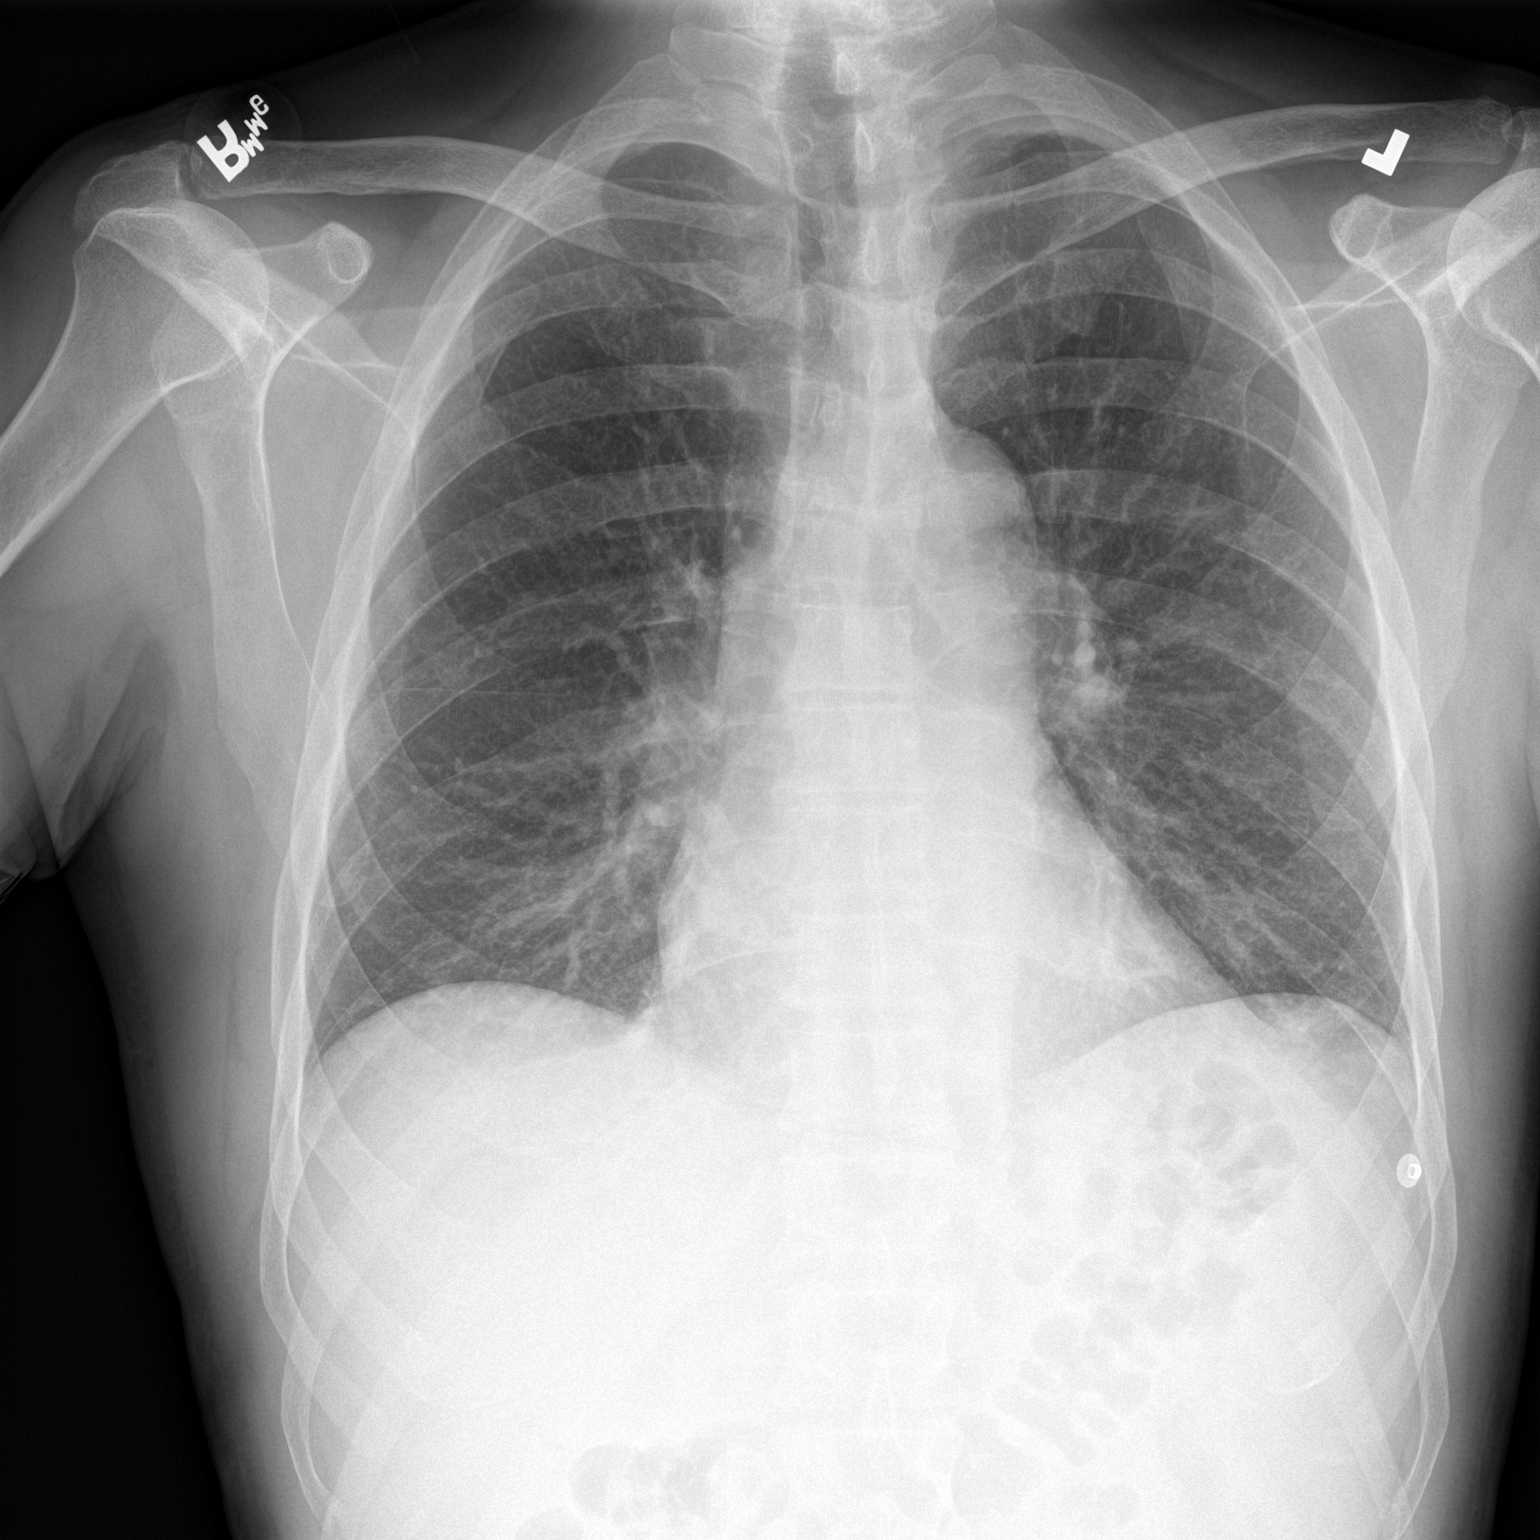

[chest lat]
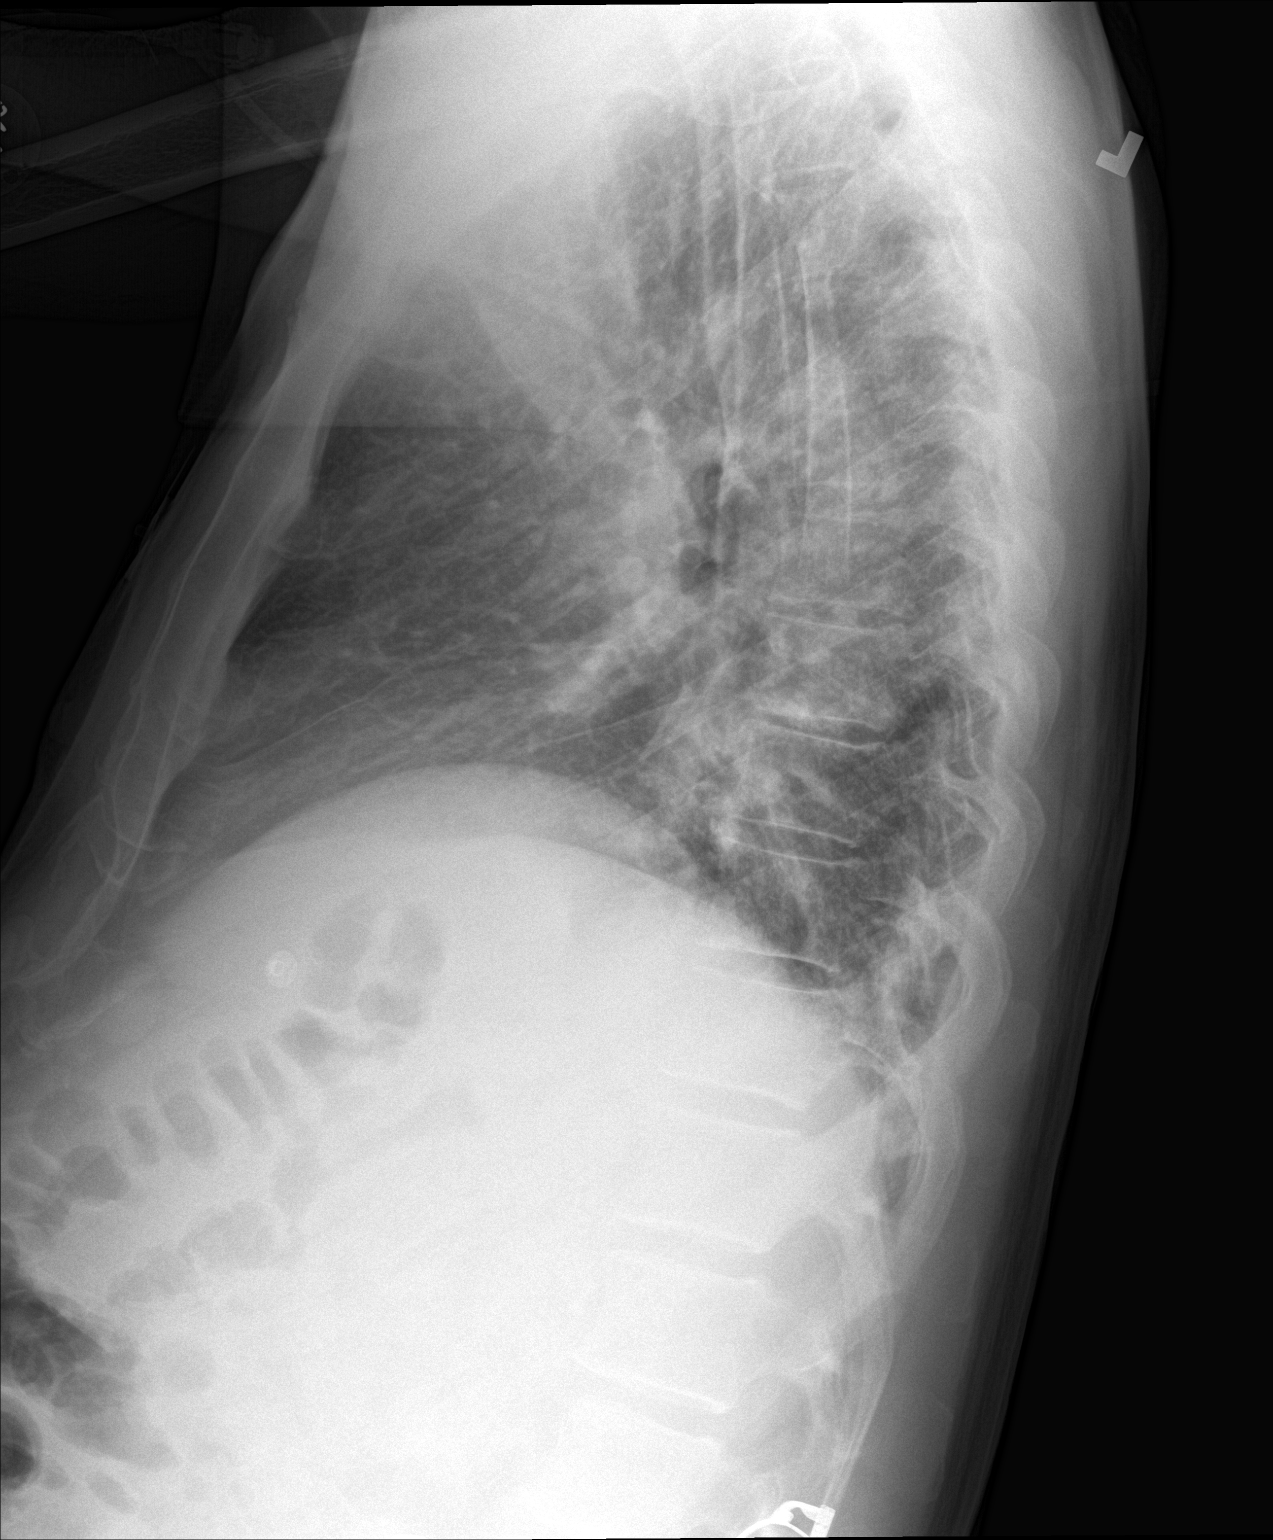

[2 of 2 positions shown; findings below may reference images not displayed]

FINDINGS: The heart size and mediastinal contours are within normal limits.
Both lungs are clear. The visualized skeletal structures are
unremarkable.
IMPRESSION: No active cardiopulmonary disease.

## 2016-12-02 IMAGING — MR MR MRA HEAD W/O CM
9 of 13 series · 28 of 48 positions shown · non-contrast
Comparison: Head CT without contrast 08/08/2008.

CLINICAL DATA: 64-year-old male with altered mental status,
dizziness, loss of balance. Fever, petechial rash, endocarditis.
Initial encounter.

EXAM:
MRI HEAD WITHOUT CONTRAST
MRA HEAD WITHOUT CONTRAST
TECHNIQUE: Multiplanar, multiecho pulse sequences of the brain and surrounding
structures were obtained without intravenous contrast. Angiographic
images of the head were obtained using MRA technique without
contrast.

[Series 2: FLAIR · sagittal · 5.0mm · 0.47mm/px · 2 of 24 slices shown (1 of 2)]
[im 1/24]
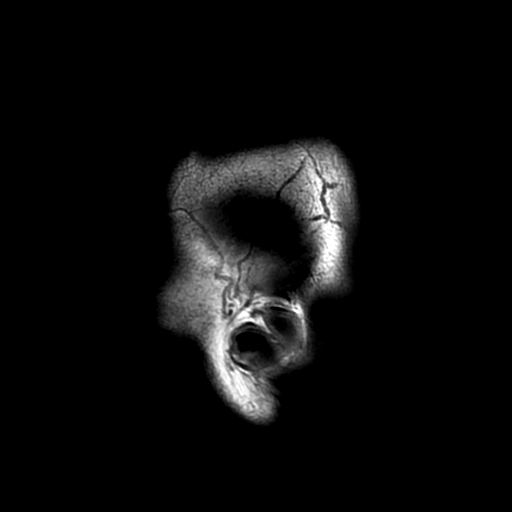
[im 24/24]
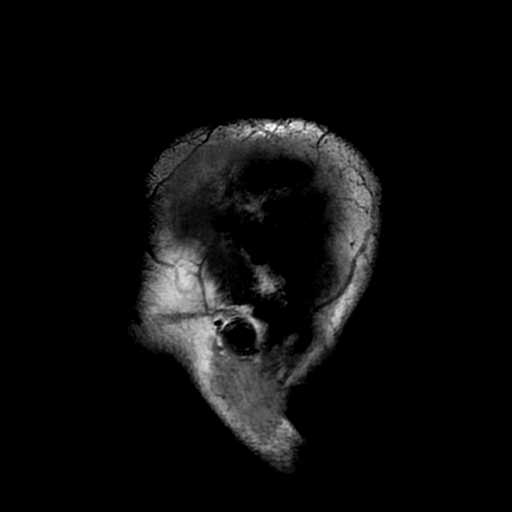

[Series 5: DWI · axial · 3.0mm · 0.94mm/px · z∈[+8,+154]mm · 6 of 99 slices shown (1 of 4)]
[im 1/99]
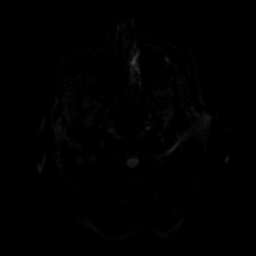
[im 20/99]
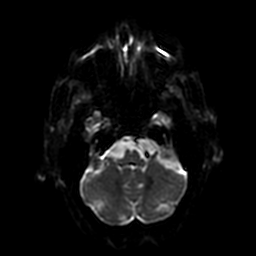
[im 40/99]
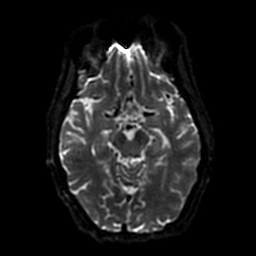
[im 59/99]
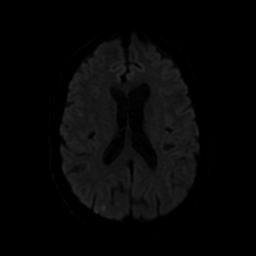
[im 79/99]
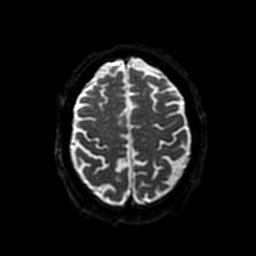
[im 99/99]
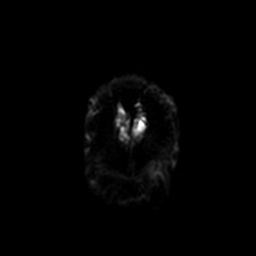

[Series 6: ax (id) 2 · axial · 1.2mm · 0.43mm/px · z∈[-1,+94]mm · 7 of 200 slices shown]
[im 1/200]
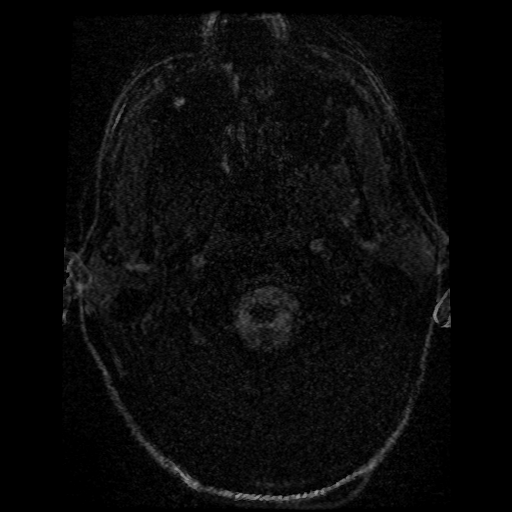
[im 40/200]
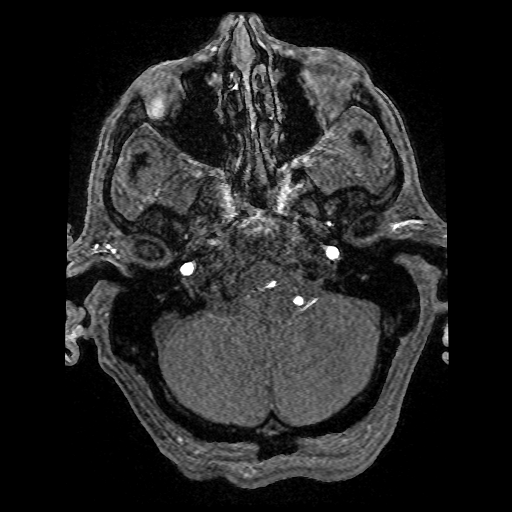
[im 60/200]
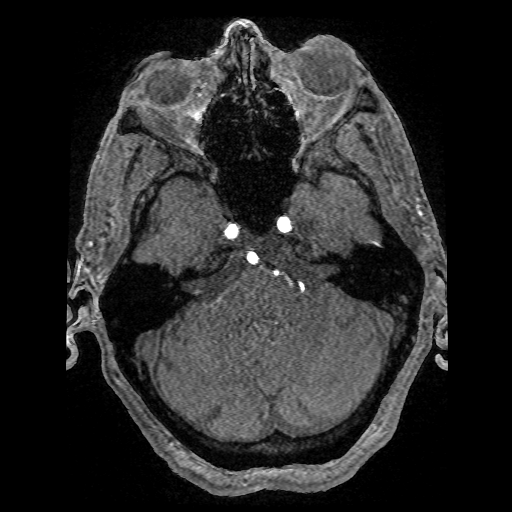
[im 80/200]
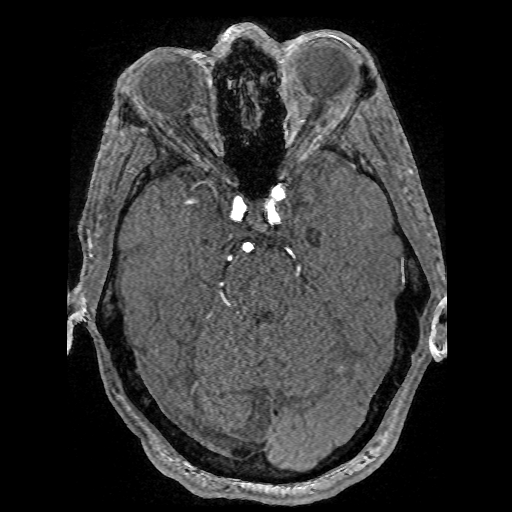
[im 120/200]
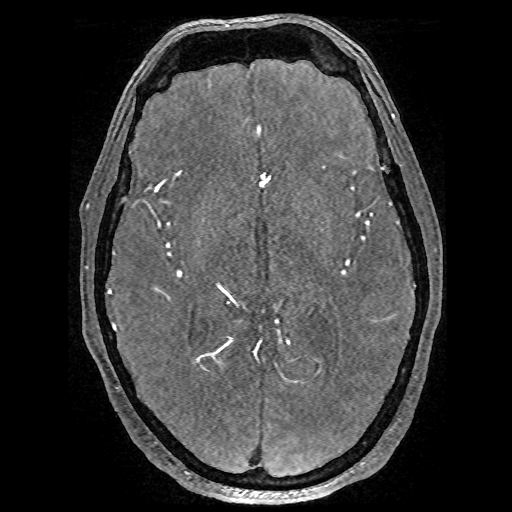
[im 140/200]
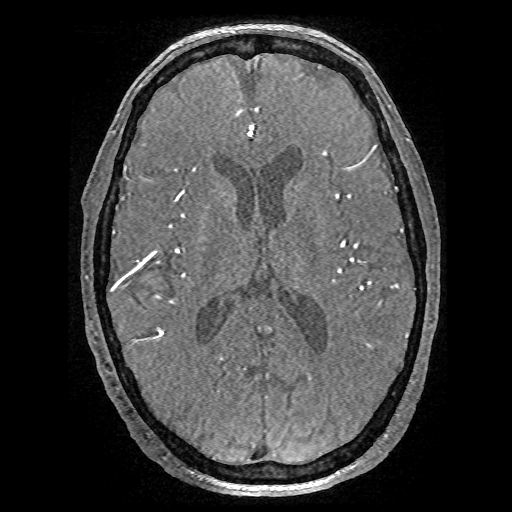
[im 160/200]
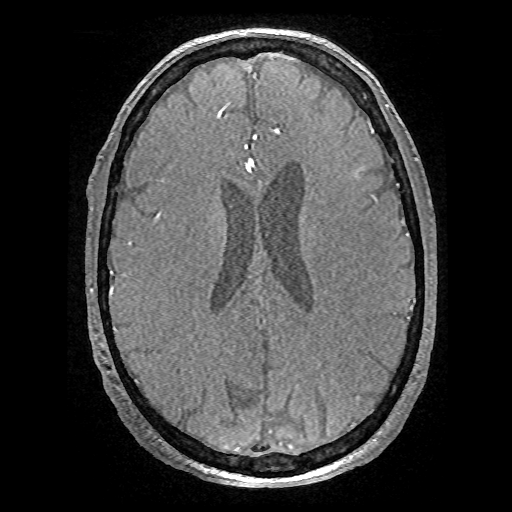

[Series 7: T2 · axial · 5.0mm · 0.43mm/px · 1 of 26 slices shown (1 of 2)]
[im 1/26]
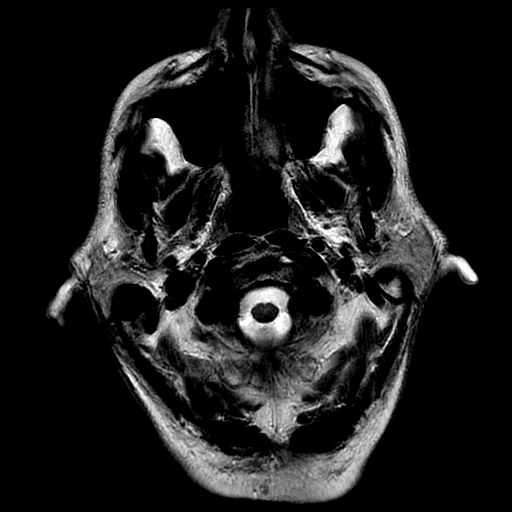

[Series 9: FLAIR · axial · 5.0mm · 0.43mm/px · 1 of 26 slices shown (2 of 2)]
[im 1/26]
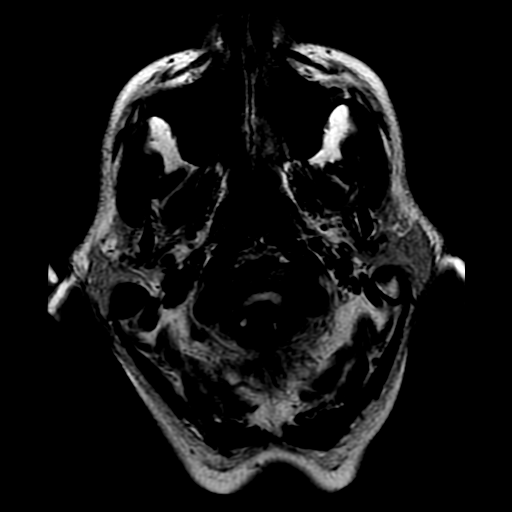

[Series 10: DWI · coronal · 5.0mm · 0.94mm/px · 4 of 74 slices shown (2 of 4)]
[im 1/74]
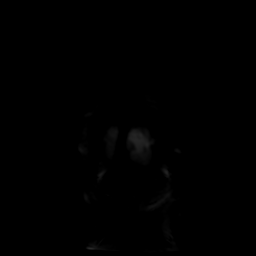
[im 25/74]
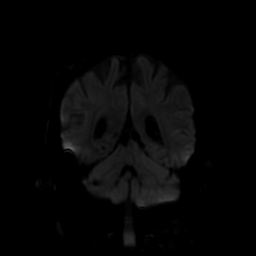
[im 49/74]
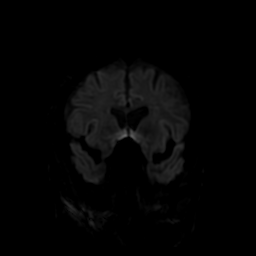
[im 74/74]
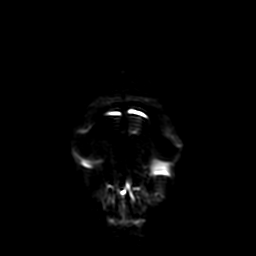

[Series 13: T2 · coronal · 5.0mm · 0.39mm/px · 2 of 31 slices shown (2 of 2)]
[im 1/31]
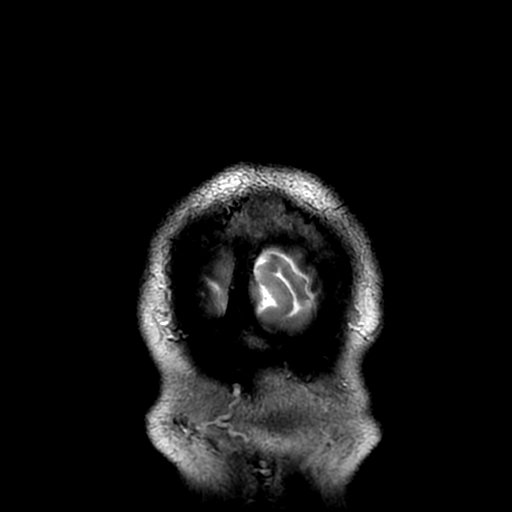
[im 31/31]
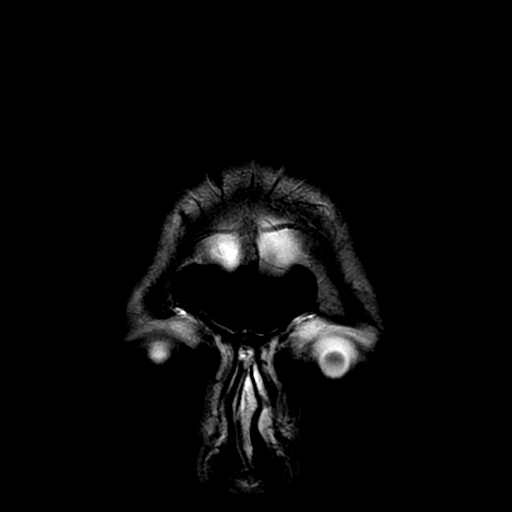

[Series 500: DWI · axial · 3.0mm · 0.94mm/px · z∈[+8,+154]mm · 3 of 49 slices shown (3 of 4)]
[im 1/49]
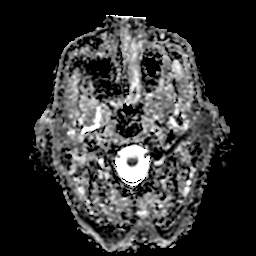
[im 25/49]
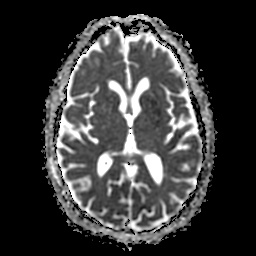
[im 49/49]
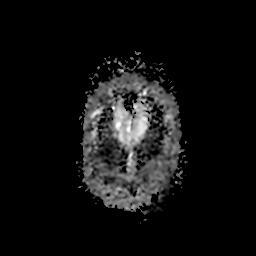

[Series 1000: DWI · coronal · 5.0mm · 0.94mm/px · 2 of 37 slices shown (4 of 4)]
[im 1/37]
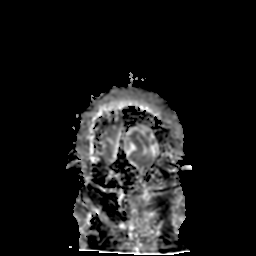
[im 37/37]
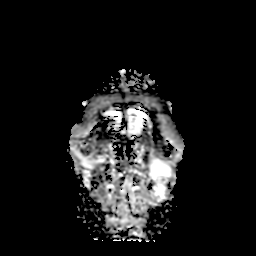

[28 of 48 positions shown; findings below may reference images not displayed]

FINDINGS: MRI HEAD FINDINGS

Wedge-shaped encephalomalacia in the anterior left middle temporal
gyrus (series 9, image 19). 4-5 small foci of chronic appearing
microhemorrhage, the largest in the left occipital pole on series
11, image 42. On diffusion there are scattered small foci of
increased trace diffusion signal in both hemispheres. The majority
year in the left MCA territory, but right MCA and right MCA/ PCA
watershed territory is also affected. Furthermore, there are several
similar small foci in the left cerebellar hemisphere. Some of these
demonstrate facilitated diffusion on ADC, and T2 and FLAIR
hyperintensity (lesion in the posterior left corona radiata on
series 5, image 37). Some are restricted.

Deep gray matter nuclei and brainstem are spared. Major intracranial
vascular flow voids are preserved with mild intracranial artery
dolichoectasia.

No intracranial mass effect. No ventriculomegaly. Cerebral volume is
within normal limits for age. Negative pituitary, cervicomedullary
junction and visualized cervical spine.

Visible internal auditory structures appear normal. Trace left
mastoid effusion. Paranasal sinuses and right mastoid are clear.
Negative orbit and scalp.

Nonspecific decreased bone marrow signal at the skullbase and in the
visualized cervical spine, patchy geographic pattern. More normal
marrow signal in the calvarium.

MRA HEAD FINDINGS

Antegrade flow in the posterior circulation with dominant distal
left vertebral artery. Mild distal left vertebral artery
atherosclerosis without significant stenosis. Patent vertebrobasilar
junction. Normal left PICA. Dominant appearing right AICA. No
basilar artery stenosis. Normal SCA and PCA origins. Small posterior
communicating arteries. Normal bilateral PCA branches.

Antegrade flow in both ICA siphons. Evidence of calcified plaque
without siphon stenosis. Tortuous but otherwise normal ophthalmic
artery origins, more so the left. Patent carotid termini. Normal MCA
and ACA origins. Normal posterior communicating artery origins.

Tortuous proximal ACA is. Diminutive or absent anterior
communicating artery. Visualized bilateral ACA branches are within
normal limits. Mildly tortuous bilateral MCAs. No MCA stenosis or
branch occlusion identified. Overall mild generalized intracranial
artery dolichoectasia.
IMPRESSION: 1. Scattered small acute to subacute, and occasional early chronic,
lacunar infarcts in both MCA territories and the left cerebellum.
None of these appear hemorrhagic (cerebral septic emboli are prone
to bleed), but consider sequelae of endocarditis and septic emboli
in this setting.
No significant edema or mass effect.
2. Chronic anterior division left MCA infarct. A small number of
chronic micro hemorrhages are identified in both hemispheres an the
left cerebellum.
3. Mild for age intracranial atherosclerosis with no stenosis or
branch occlusion identified. Mild intracranial artery
dolichoectasia.

## 2017-02-05 ENCOUNTER — Encounter: Payer: Self-pay | Admitting: Neurology

## 2017-02-06 ENCOUNTER — Ambulatory Visit (INDEPENDENT_AMBULATORY_CARE_PROVIDER_SITE_OTHER): Payer: Medicare Other | Admitting: Neurology

## 2017-02-06 ENCOUNTER — Encounter: Payer: Self-pay | Admitting: Neurology

## 2017-02-06 VITALS — BP 110/63 | HR 62 | Ht 72.0 in | Wt 169.0 lb

## 2017-02-06 DIAGNOSIS — F3341 Major depressive disorder, recurrent, in partial remission: Secondary | ICD-10-CM | POA: Diagnosis not present

## 2017-02-06 DIAGNOSIS — G2581 Restless legs syndrome: Secondary | ICD-10-CM | POA: Diagnosis not present

## 2017-02-06 DIAGNOSIS — G43919 Migraine, unspecified, intractable, without status migrainosus: Secondary | ICD-10-CM

## 2017-02-06 DIAGNOSIS — E538 Deficiency of other specified B group vitamins: Secondary | ICD-10-CM | POA: Diagnosis not present

## 2017-02-06 DIAGNOSIS — R413 Other amnesia: Secondary | ICD-10-CM

## 2017-02-06 DIAGNOSIS — G3184 Mild cognitive impairment, so stated: Secondary | ICD-10-CM | POA: Insufficient documentation

## 2017-02-06 DIAGNOSIS — I2510 Atherosclerotic heart disease of native coronary artery without angina pectoris: Secondary | ICD-10-CM | POA: Diagnosis not present

## 2017-02-06 DIAGNOSIS — R42 Dizziness and giddiness: Secondary | ICD-10-CM

## 2017-02-06 HISTORY — DX: Other amnesia: R41.3

## 2017-02-06 MED ORDER — DIVALPROEX SODIUM 250 MG PO DR TAB: DELAYED_RELEASE_TABLET | ORAL | 3 refills | Status: DC

## 2017-02-06 NOTE — Patient Instructions (Signed)
   With the topamax, 100 mg begin taking 1/2 tablet daily for 2 weeks, then stop.  We will start depakote for the headache.  Depakote (valproic acid) is a seizure medication that also has an FDA approval for migraine headache. The most common potential side effects of this medication include weight gain, tremor, or possible stomach upset. This medication can potentially cause liver problems. If confusion is noted on this medication, contact our office immediately.   MRI of the brain will be done.

## 2017-02-06 NOTE — Progress Notes (Signed)
Reason for visit: Headaches, memory disturbance  Referring physician: Dr. Sonda Rumble is a 66 y.o. 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  The patient suffered bihemispheric strokes around that time.  He also has a history of migraine headaches.  The patient was seen in July 2017 for problems with the headaches.  The patient was placed on Topamax, but he never returned for a revisit evaluation.  The patient comes in today for evaluation of new problems.  The patient believes that over the last several weeks he has developed increased problems with headaches, the headaches are bifrontal temporal and are brief lasting about 5 minutes but may be quite severe.  The patient may have photophobia and phonophobia with the headache but no nausea or vomiting.  The patient may have 5 such episodes daily.  The patient may also have episodes of vertigo as he was having previously.  He has had a transient event of left arm weakness that occurred several weeks ago, this is not present currently.  The patient has had worsening difficulty with memory and concentration, and difficulty relating thoughts or ideas verbally.  The patient also has had a significant increase in depression, his Zoloft dose has been increased.  He admits to having some suicidal ideation, but he has not acted on this.  The patient denies any blackout episodes or other episodes of altered awareness.  He believes that his balance is also changed, he has not had any falls.  She has chronic issues with urinary urgency and occasional incontinence, he has had prostate surgery previously.  He has had occasional bouts of diarrhea.  The patient comes back to this office for an urgent evaluation.  Past Medical History:  Diagnosis Date  . Aortic valve  endocarditis - Enterococcus 10/19/2014  . 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 Coronary artery disease involving native coronary artery without angina pectoris 10/23/2014  . 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 Enterococcal bacteremia 10/19/2014  . Hepatitis   . Migraine 09/12/2015  . 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 Paroxysmal atrial fibrillation ([place]) 10/19/2014  . Paroxysmal atrial flutter ([place]) 10/19/2014  . Prostate cancer ([place])   . Protein-calorie malnutrition, severe ([place])   . Restless leg syndrome   . 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/2014   23 mm [facility] Ease bovine pericardial bioprosthetic tissue valve with bovine pericardial patch enlargement of aortic root  . 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 S/P mitral valve repair 11/01/2014   Debridement of vegetation on anterior leaflet  . 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 AORTIC ROOT ENLARGEMENT N/A 11/01/2014   Procedure: AORTIC ROOT ENLARGEMENT;  Surgeon: Rexene Alberts, MD;  Location: [place];  Service: Open Heart Surgery;  Laterality: N/A;  . AORTIC VALVE REPLACEMENT N/A 11/01/2014   Procedure: AORTIC VALVE REPLACEMENT (AVR);  Surgeon: Rexene Alberts, MD;  Location: [place];  Service: Open Heart Surgery;  Laterality: N/A;  . CARDIAC CATHETERIZATION N/A 10/23/2014   Procedure: Right/Left Heart Cath and Coronary  Angiography;  Surgeon: Lorretta Harp, MD;  Location: [place] CV LAB;  Service: Cardiovascular;  Laterality: N/A;  . CARDIOVERSION N/A 12/18/2014   Procedure: CARDIOVERSION;  Surgeon: Pixie Casino, MD;  Location: [facility] ENDOSCOPY;  Service: Cardiovascular;  Laterality: N/A;  . CARDIOVERSION N/A 02/06/2015    Procedure: CARDIOVERSION;  Surgeon: Larey Dresser, MD;  Location: [place];  Service: Cardiovascular;  Laterality: N/A;  . COLONOSCOPY N/A 10/25/2014   Procedure: COLONOSCOPY;  Surgeon: Arta Silence, MD;  Location: [facility] ENDOSCOPY;  Service: Endoscopy;  Laterality: N/A;  . CORONARY ARTERY BYPASS GRAFT N/A 11/01/2014   Procedure: CORONARY ARTERY BYPASS GRAFTING (CABG) x 1 using internal mammary artery;  Surgeon: Rexene Alberts, MD;  Location: [place];  Service: Open Heart Surgery;  Laterality: N/A;  . MITRAL VALVE REPAIR N/A 11/01/2014   Procedure: MITRAL VALVE REPAIR with no Ring;  Surgeon: Rexene Alberts, MD;  Location: [place];  Service: Open Heart Surgery;  Laterality: N/A;  . PROSTATECTOMY    . TEE WITHOUT CARDIOVERSION N/A 10/20/2014   Procedure: TRANSESOPHAGEAL ECHOCARDIOGRAM (TEE);  Surgeon: Josue Hector, MD;  Location: [place];  Service: Cardiovascular;  Laterality: N/A;  . TEE WITHOUT CARDIOVERSION N/A 11/01/2014   Procedure: TRANSESOPHAGEAL ECHOCARDIOGRAM (TEE);  Surgeon: Rexene Alberts, MD;  Location: [place];  Service: Open Heart Surgery;  Laterality: N/A;    Family History  Problem Relation Age of Onset  . Stroke Father     Social history:  reports that he has quit smoking. he has never used smokeless tobacco. He reports that he does not drink alcohol or use drugs.  Medications:  Prior to Admission medications   Medication Sig Start Date End Date Taking? Authorizing Provider  aspirin-acetaminophen-caffeine (EXCEDRIN MIGRAINE) [phone] MG tablet Take 2 tablets by mouth daily as needed for migraine.   Yes [provider]  gabapentin (NEURONTIN) 300 MG capsule Take 300 mg by mouth 2 (two) times daily. Take 1 cap with dinner and then another cap at bedtime   Yes [provider]  Multiple Vitamins-Minerals (PRESERVISION AREDS 2) CAPS Take 1 capsule by mouth 2 (two) times daily.   Yes [provider]  sertraline (ZOLOFT) 50 MG tablet Take 1 tablet (50  mg total) by mouth at bedtime. 11/19/15  Yes Croitoru, Mihai, MD  topiramate (TOPAMAX) 100 MG tablet Take 1 tablet (100 mg total) by mouth at bedtime. 11/19/15  Yes Kathrynn Ducking, MD      Allergies  Allergen Reactions  . Pollen Extract Other (See Comments)    sneezing    ROS:  Out of a complete 14 system review of symptoms, the patient complains only of the following symptoms, and all other reviewed systems are negative.  Restless legs Joint pain, walking difficulty Depression, anxiety Decreased energy  Blood pressure 110/63, pulse 62, height 6' (1.829 m), weight 169 lb (76.7 kg).  Physical Exam  General: The patient is alert and cooperative at the time of the examination.  Eyes: Pupils are equal, round, and reactive to light. Discs are flat bilaterally.  Neck: The neck is supple, no carotid bruits are noted.  Respiratory: The respiratory examination is clear.  Cardiovascular: The cardiovascular examination reveals a regular rate and rhythm, no obvious murmurs or rubs are noted.  Skin: Extremities are without significant edema.  Neurologic Exam  Mental status: The patient is alert and oriented x 3 at the time of the examination. The patient has apparent normal recent and remote memory, with an apparently  normal attention span and concentration ability.  Mini-Mental status examination done today shows a total score of 30/30.  Cranial nerves: Facial symmetry is present. There is good sensation of the face to pinprick and soft touch bilaterally. The strength of the facial muscles and the muscles to head turning and shoulder shrug are normal bilaterally. Speech is well enunciated, no aphasia or dysarthria is noted. Extraocular movements are full. Visual fields are full. The tongue is midline, and the patient has symmetric elevation of the soft palate. No obvious hearing deficits are noted.  Motor: The motor testing reveals 5 over 5 strength of all 4 extremities. Good symmetric  motor tone is noted throughout.  Sensory: Sensory testing is intact to pinprick, soft touch, vibration sensation, and position sense on all 4 extremities, with exception of some reports of decreased pinprick sensation on the right forearm and right leg, decreased vibration sensation on the right foot. No evidence of extinction is noted.  Coordination: Cerebellar testing reveals good finger-nose-finger and heel-to-shin bilaterally.  Gait and station: Gait is wide-based, slow.  Tandem gait is unsteady. Romberg is negative. No drift is seen.  Reflexes: Deep tendon reflexes are symmetric and normal bilaterally. Toes are downgoing bilaterally.   MRI brain 09/27/15:  IMPRESSION:  Abnormal MRI brain (without) demonstrating: 1. Chronic ischemic infarcts noted in the right cerebellum, bilateral occipital, bilateral frontal and bilateral parietal regions.  2. Chronic cerebral microhemorrhages vs dystrophic calcifications in the right frontal, left occipital and left cerebellar regions. 3. No acute findings. 4. Compared to MRI on 10/20/14, there are new chronic-appearing bilateral occipital infarcts.   * MRI scan images were reviewed online. I agree with the written report.    Assessment/Plan:  1.  History of migraine headache, increased frequency and severity  2.  Reports of memory disturbance, recent onset  3.  Recent increase in depression  4.  Restless leg syndrome  The patient comes into the office today with an increase in headache frequency over the last several weeks with reports of difficulty with memory.  The patient is not clear that the memory problems started around the time of the endocarditis when he was having bihemispheric strokes.  The memory issues appear to be more recent, and may parallel the onset of severe depression.  Given his history, we will repeat MRI of the brain.  The patient is on Topamax which can impair cognitive functioning, we will reduce the dose to 50 mg at  night for 2 weeks and then stop the drug.  The patient will be placed on Depakote 250 mg twice daily for 2 weeks and then go to 500 mg twice daily.  The patient will follow-up in 3 months.  Blood work will be done today.  Jill Alexanders MD 02/06/2017 8:22 AM  [facility] [address] [place], [place] [address]  Phone [phone] Fax [phone]

## 2017-02-07 LAB — VITAMIN B12: Vitamin B-12: 519 pg/mL (ref 232–1245)

## 2017-02-07 LAB — HIV ANTIBODY (ROUTINE TESTING W REFLEX): HIV Screen 4th Generation wRfx: NONREACTIVE

## 2017-02-07 LAB — RPR: RPR: NONREACTIVE

## 2017-02-07 LAB — SEDIMENTATION RATE: SED RATE: 2 mm/h (ref 0–30)

## 2017-02-09 ENCOUNTER — Telehealth: Payer: Self-pay | Admitting: *Deleted

## 2017-02-09 NOTE — Telephone Encounter (Signed)
Called and LVM for pt about unremarkable labs per CW,MD note. Gave [facility] phone number if he has further questions/concerns.

## 2017-02-09 NOTE — Telephone Encounter (Signed)
-----   Message from Kathrynn Ducking, MD sent at 02/08/2017  5:56 PM EST -----   The blood work results are unremarkable. Please call the patient.  ----- Message ----- From: Lavone Neri Lab Results In Sent: 02/07/2017   7:41 AM To: Kathrynn Ducking, MD

## 2017-02-19 ENCOUNTER — Ambulatory Visit
Admission: RE | Admit: 2017-02-19 | Discharge: 2017-02-19 | Disposition: A | Discharge status: Home / Self Care | Payer: Medicare Other | Source: Ambulatory Visit | Attending: Neurology | Admitting: Neurology

## 2017-02-19 DIAGNOSIS — G43919 Migraine, unspecified, intractable, without status migrainosus: Secondary | ICD-10-CM

## 2017-02-19 DIAGNOSIS — R413 Other amnesia: Secondary | ICD-10-CM | POA: Diagnosis not present

## 2017-02-20 ENCOUNTER — Telehealth: Payer: Self-pay | Admitting: Neurology

## 2017-02-20 NOTE — Telephone Encounter (Signed)
MRI of the brain appears to show no changes from 2017.  The patient is getting on Depakote for his worsening headache issue.  He will call for any dose adjustments.   MRI brain 02/19/17:  IMPRESSION:  Abnormal MRI brain (without) demonstrating: 1. Chronic ischemic infarcts noted in the right cerebellum, bilateral occipital, bilateral frontal and bilateral parietal regions.  2. Chronic cerebral microhemorrhages vs dystrophic calcifications in the right frontal, left occipital and left cerebellar regions. 3. No acute findings.  4. Compared to MRI on 09/26/15, no significant change.

## 2017-05-04 ENCOUNTER — Other Ambulatory Visit: Payer: Self-pay | Admitting: Neurology

## 2017-05-06 NOTE — Progress Notes (Deleted)
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  The patient suffered bihemispheric strokes around that time.  He also has a history of migraine headaches.  The patient was seen in July 2017 for problems with the headaches.  The patient was placed on Topamax, but he never returned for a revisit evaluation.  The patient comes in today for evaluation of new problems.  The patient believes that over the last several weeks he has developed increased problems with headaches, the headaches are bifrontal temporal and are brief lasting about 5 minutes but may be quite severe.  The patient may have photophobia and phonophobia with the headache but no nausea or vomiting.  The patient may have 5 such episodes daily.  The patient may also have episodes of vertigo as he was having previously.  He has had a transient event of left arm weakness that occurred several weeks ago, this is not present currently.  The patient has had worsening difficulty with memory and concentration, and difficulty relating thoughts or ideas verbally.  The patient also has had a significant increase in depression, his Zoloft dose has been increased.  He admits to having some suicidal ideation, but he has not acted on this.  The patient denies any blackout episodes or other episodes of altered awareness.  He believes that his balance is also changed, he has not had any falls.  She has chronic issues with urinary urgency and occasional incontinence, he has had prostate surgery previously.  He has had occasional bouts of diarrhea.  The patient comes back to this office for an urgent evaluation  REVIEW OF SYSTEMS: Full 14 system review of systems performed  and notable only for those listed, all others are neg:  Constitutional: neg  Cardiovascular: neg Ear/Nose/Throat: neg  Skin: neg Eyes: neg Respiratory: neg Gastroitestinal: neg  Hematology/Lymphatic: neg  Endocrine: neg Musculoskeletal:neg Allergy/Immunology: neg Neurological: neg Psychiatric: neg Sleep : neg   ALLERGIES: Allergies  Allergen Reactions  . Pollen Extract Other (See Comments)    sneezing    HOME MEDICATIONS: Outpatient Medications Prior to Visit  Medication Sig Dispense Refill  . aspirin-acetaminophen-caffeine (EXCEDRIN MIGRAINE) 250-250-65 MG tablet Take 2 tablets by mouth daily as needed for migraine.    . divalproex (DEPAKOTE) 250 MG DR tablet One tablet twice a day for 2 weeks, then start 2 tablets twice a day 120 tablet 3  . gabapentin (NEURONTIN) 300 MG capsule Take 300 mg by mouth 2 (two) times daily. Take 1 cap with dinner and then another cap at bedtime    . Multiple Vitamins-Minerals (PRESERVISION AREDS 2) CAPS Take 1 capsule by mouth 2 (two) times daily.    . sertraline (ZOLOFT) 50 MG tablet Take 1 tablet (50 mg total) by mouth at bedtime. 30 tablet 3  . topiramate (TOPAMAX) 100 MG tablet Take 1 tablet (100 mg total) by mouth at bedtime. 90 tablet 3   No facility-administered medications prior to visit.     PAST MEDICAL HISTORY: Past Medical History:  Diagnosis Date  . 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 Coronary artery disease involving native coronary artery without angina pectoris 10/23/2014  . Endocarditis of mitral valve - Enterococcus  10/19/2014  . Enterococcal bacteremia 10/19/2014  . Hepatitis   . Memory difficulty 02/06/2017  . Migraine 09/12/2015  . 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 Paroxysmal atrial fibrillation ([place]) 10/19/2014  . Paroxysmal atrial flutter ([place]) 10/19/2014  . Prostate cancer ([place])   . Protein-calorie malnutrition, severe  ([place])   . Restless leg syndrome   . 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/2014   23 mm [facility] Ease bovine pericardial bioprosthetic tissue valve with bovine pericardial patch enlargement of aortic root  . 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 S/P mitral valve repair 11/01/2014   Debridement of vegetation on anterior leaflet  . 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 AORTIC ROOT ENLARGEMENT N/A 11/01/2014   Procedure: AORTIC ROOT ENLARGEMENT;  Surgeon: Rexene Alberts, MD;  Location: [place];  Service: Open Heart Surgery;  Laterality: N/A;  . AORTIC VALVE REPLACEMENT N/A 11/01/2014   Procedure: AORTIC VALVE REPLACEMENT (AVR);  Surgeon: Rexene Alberts, MD;  Location: [place];  Service: Open Heart Surgery;  Laterality: N/A;  . CARDIAC CATHETERIZATION N/A 10/23/2014   Procedure: Right/Left Heart Cath and Coronary Angiography;  Surgeon: Lorretta Harp, MD;  Location: [place] CV LAB;  Service: Cardiovascular;  Laterality: N/A;  . CARDIOVERSION N/A 12/18/2014   Procedure: CARDIOVERSION;  Surgeon: Pixie Casino, MD;  [facility] ENDOSCOPY;  Service: Cardiovascular;  Laterality: N/A;  . CARDIOVERSION N/A 02/06/2015   Procedure: CARDIOVERSION;  Surgeon: Larey Dresser, MD;  Location: [place];  Service: Cardiovascular;  Laterality: N/A;  . COLONOSCOPY N/A 10/25/2014   Procedure: COLONOSCOPY;  Surgeon: Arta Silence, MD;  Location: [facility] ENDOSCOPY;  Service: Endoscopy;  Laterality: N/A;  . CORONARY ARTERY BYPASS GRAFT N/A 11/01/2014   Procedure: CORONARY ARTERY BYPASS GRAFTING (CABG) x 1 using internal mammary artery;  Surgeon: Rexene Alberts, MD;  Location: [place];  Service: Open Heart Surgery;  Laterality: N/A;  . MITRAL  VALVE REPAIR N/A 11/01/2014   Procedure: MITRAL VALVE REPAIR with no Ring;  Surgeon: Rexene Alberts, MD;  Location: [place];  Service: Open Heart Surgery;  Laterality: N/A;  . PROSTATECTOMY    . TEE WITHOUT CARDIOVERSION N/A 10/20/2014   Procedure: TRANSESOPHAGEAL ECHOCARDIOGRAM (TEE);  Surgeon: Josue Hector, MD;  Location: [place];  Service: Cardiovascular;  Laterality: N/A;  . TEE WITHOUT CARDIOVERSION N/A 11/01/2014   Procedure: TRANSESOPHAGEAL ECHOCARDIOGRAM (TEE);  Surgeon: Rexene Alberts, MD;  Location: [place];  Service: Open Heart Surgery;  Laterality: N/A;    FAMILY HISTORY: Family History  Problem Relation Age of Onset  . Stroke Father     SOCIAL HISTORY: Social History   Socioeconomic History  . Marital status: Married    Spouse name: Not on file  . Number of children: 1  . Years of education: Master's  . Highest education level: Not on file  Social Needs  . Financial resource strain: Not on file  . Food insecurity - worry: Not on file  . Food insecurity - inability: Not on file  . Transportation needs - medical: Not on file  . Transportation needs - non-medical: Not on file  Occupational History  . Occupation: [employer]    Employer: [employer]  Tobacco Use  .  Smoking status: Former Research scientist (life sciences)  . Smokeless tobacco: Never Used  . Tobacco comment: Smoked a pipe age 36->28  Substance and Sexual Activity  . Alcohol use: No    Alcohol/week: 0.0 oz    Comment: Drank from age 56->38  . Drug use: No  . Sexual activity: Not on file  Other Topics Concern  . Not on file  Social History Narrative   Lives at home w/ his wife   Right-handed   Caffeine: 2 cups daily     PHYSICAL EXAM  There were no vitals filed for this visit. There is no height or weight on file to calculate BMI.  Generalized: Well developed, in no acute distress  Head: normocephalic and atraumatic,. Oropharynx benign  Neck: Supple, no carotid bruits  Cardiac: Regular rate rhythm, no murmur    Musculoskeletal: No deformity   Neurological examination   Mentation: Alert oriented to time, place, history taking. Attention span and concentration appropriate. Recent and remote memory intact.  Follows all commands speech and language fluent.   Cranial nerve II-XII: Fundoscopic exam reveals sharp disc margins.Pupils were equal round reactive to light extraocular movements were full, visual field were full on confrontational test. Facial sensation and strength were normal. hearing was intact to finger rubbing bilaterally. Uvula tongue midline. head turning and shoulder shrug were normal and symmetric.Tongue protrusion into cheek strength was normal. Motor: normal bulk and tone, full strength in the BUE, BLE, fine finger movements normal, no pronator drift. No focal weakness Sensory: normal and symmetric to light touch, pinprick, and  Vibration, proprioception  Coordination: finger-nose-finger, heel-to-shin bilaterally, no dysmetria Reflexes: Brachioradialis 2/2, biceps 2/2, triceps 2/2, patellar 2/2, Achilles 2/2, plantar responses were flexor bilaterally. Gait and Station: Rising up from seated position without assistance, normal stance,  moderate stride, good arm swing, smooth turning, able to perform tiptoe, and heel walking without difficulty. Tandem gait is steady  DIAGNOSTIC DATA (LABS, IMAGING, TESTING) - I reviewed patient records, labs, notes, testing and imaging myself where available.  Lab Results  Component Value Date   WBC 5.3 12/13/2014   HGB 12.6 (L) 12/13/2014   HCT 39.7 12/13/2014   MCV 86.7 12/13/2014   PLT 200 12/13/2014      Component Value Date/Time   NA 141 03/06/2016 0954   NA 141 02/22/2015 1447   K 4.2 03/06/2016 0954   CL 110 03/06/2016 0954   CO2 24 03/06/2016 0954   GLUCOSE 93 03/06/2016 0954   BUN 21 03/06/2016 0954   BUN 23 02/22/2015 1447   CREATININE 1.18 03/06/2016 0954   CALCIUM 9.4 03/06/2016 0954   PROT 6.9 03/06/2016 0954   PROT 6.3  02/22/2015 1447   ALBUMIN 4.1 03/06/2016 0954   ALBUMIN 3.9 02/22/2015 1447   AST 26 03/06/2016 0954   ALT 22 03/06/2016 0954   ALKPHOS 60 03/06/2016 0954   BILITOT 0.8 03/06/2016 0954   BILITOT 0.7 02/22/2015 1447   GFRNONAA 52 (L) 02/22/2015 1447   GFRAA 61 02/22/2015 1447   Lab Results  Component Value Date   CHOL 154 03/06/2016   HDL 45 03/06/2016   LDLCALC 95 03/06/2016   TRIG 71 03/06/2016   CHOLHDL 3.4 03/06/2016   Lab Results  Component Value Date   HGBA1C 6.4 (H) 10/20/2014   Lab Results  Component Value Date   VITAMINB12 519 02/06/2017   Lab Results  Component Value Date   TSH 1.708 10/18/2014    ***  ASSESSMENT AND PLAN  67 y.o. year old male  has a past medical history of Aortic valve endocarditis - Enterococcus (10/19/2014), Aortic valve insufficiency, severe, infectious (10/20/2014), Coronary artery disease involving native coronary artery without angina pectoris (10/23/2014), Endocarditis of mitral valve - Enterococcus (10/19/2014), Enterococcal bacteremia (10/19/2014), Hepatitis, Memory difficulty (02/06/2017), Migraine (09/12/2015), Mitral valve regurgitation, infectious (10/20/2014), Paroxysmal atrial fibrillation ([place]) (10/19/2014), Paroxysmal atrial flutter ([place]) (10/19/2014), Prostate cancer ([place]), Protein-calorie malnutrition, severe ([place]), Restless leg syndrome, S/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 (11/01/2014), S/P CABG x 1 (11/01/2014), S/P mitral valve repair (11/01/2014), Septic embolism ([place]) (10/20/2014), Splenic infarction (10/24/2014), and Stroke, embolic ([place]) (11/09/567). here with ***   History of migraine headache, increased frequency and severity  2.  Reports of memory disturbance, recent onset  3.  Recent increase in depression  4.  Restless leg syndrome  The patient comes into the office today with an increase in headache frequency over the last several weeks with reports of difficulty with memory.  The patient is not clear that  the memory problems started around the time of the endocarditis when he was having bihemispheric strokes.  The memory issues appear to be more recent, and may parallel the onset of severe depression.  Given his history, we will repeat MRI of the brain.  The patient is on Topamax which can impair cognitive functioning, we will reduce the dose to 50 mg at night for 2 weeks and then stop the drug.  The patient will be placed on Depakote 250 mg twice daily for 2 weeks and then go to 500 mg twice daily.  The patient will follow-up in 3 months.  Blood work will be done today.   Dennie Bible, [facility], [facility], APRN  [facility] [address] [place], [place] [address] [phone]

## 2017-05-07 ENCOUNTER — Telehealth: Payer: Self-pay | Admitting: *Deleted

## 2017-05-07 ENCOUNTER — Ambulatory Visit: Payer: Medicare Other | Admitting: Nurse Practitioner

## 2017-05-07 NOTE — Telephone Encounter (Signed)
Patient was no show for FU with NP today. 

## 2017-05-08 ENCOUNTER — Encounter: Payer: Self-pay | Admitting: Nurse Practitioner

## 2017-09-05 ENCOUNTER — Other Ambulatory Visit: Payer: Self-pay | Admitting: Neurology

## 2017-10-12 ENCOUNTER — Other Ambulatory Visit: Payer: Self-pay | Admitting: Neurology

## 2017-10-30 ENCOUNTER — Other Ambulatory Visit: Payer: Self-pay | Admitting: Neurology

## 2017-10-30 ENCOUNTER — Ambulatory Visit: Payer: Medicare Other | Admitting: Cardiovascular Disease

## 2018-03-01 ENCOUNTER — Telehealth: Payer: Self-pay

## 2018-03-01 ENCOUNTER — Telehealth: Payer: Self-pay | Admitting: Cardiovascular Disease

## 2018-03-01 NOTE — Telephone Encounter (Signed)
New Message      Pt c/o Shortness Of Breath: STAT if SOB developed within the last 24 hours or pt is noticeably SOB on the phone  1. Are you currently SOB (can you hear that pt is SOB on the phone)? Yes  2. How long have you been experiencing SOB? Over the last month  3. Are you SOB when sitting or when up moving around? Just when up moving around  4. Are you currently experiencing any other symptoms? No patient sitting so he's good.

## 2018-03-01 NOTE — Telephone Encounter (Signed)
Follow Up:; ° ° °Returning your call. °

## 2018-03-01 NOTE — Telephone Encounter (Signed)
LMTCB  Patient has not seen MD in almost 2 years - needs OV

## 2018-03-01 NOTE — Telephone Encounter (Signed)
LMTCB

## 2018-03-01 NOTE — Telephone Encounter (Signed)
Spoke with pt regarding c/o SOB. Pt states that he has been experiencing SOB since last month which has worsened in the last week and has DOE and at times dizziness when bending down to pick up small objects and DOE when carrying items upwards of 10lbs or when moving around his home. Pt states rest will relieve his symptoms but with rest he still felt slightly SOB. Pt denied chest pain during call, but states that he has had occasional left-sided chest pain with and without SOB and most recent episode occurred yesterday. Pt has hx of CABGx1, A fib, A flutter. Pt not on any anticoag therapy. Per Dr. Sallyanne Kuster note on 03/06/2016, anticoag therapy d/c because previous strokes d/t vegetation, not arrhythmia.   Consulted DOD Claiborne Billings, MD who advised that pt decrease activity and appt be scheduled asap. Pt originally scheduled for appt at 03/09/2018. New availability for appt on 03/02/2018. Will call pt to update with recommendation and new appt time

## 2018-03-01 NOTE — Telephone Encounter (Signed)
Duplicate encounter

## 2018-03-01 NOTE — Telephone Encounter (Signed)
New Message       Pt c/o Shortness Of Breath: STAT if SOB developed within the last 24 hours or pt is noticeably SOB on the phone  1. Are you currently SOB (can you hear that pt is SOB on the phone)? Yes  2. How long have you been experiencing SOB? Month  3. Are you SOB when sitting or when up moving around? Moving around  4. Are you currently experiencing any other symptoms? Dizziness

## 2018-03-01 NOTE — Telephone Encounter (Signed)
Spoke with pt regarding c/o SOB. Pt states that he has been experiencing SOB since last month which has worsened in the last week and has DOE and at times dizziness when bending down to pick up small objects and DOE when carrying items upwards of 10lbs or when moving around his home. Pt states rest will relieve his symptoms but with rest he still felt slightly SOB. Pt denied chest pain during call, but states that he has had occasional left-sided chest pain with and without SOB and most recent episode occurred yesterday. Pt has hx of CABGx1, A fib, A flutter. Pt not on any anticoag therapy. Per Dr. Sallyanne Kuster note on 03/06/2016, anticoag therapy d/c because previous strokes d/t vegetation, not arrhythmia.   Consulted DOD Claiborne Billings, MD who advised that pt decrease activity and appt be scheduled asap. Pt originally scheduled for appt at 03/09/2018. New availability for appt on 03/02/2018. Will call pt to update with recommendation and new appt time  Sent to primary cardiologist as Juluis Rainier

## 2018-03-02 ENCOUNTER — Ambulatory Visit: Payer: Medicare Other | Admitting: Adult Health

## 2018-03-02 DIAGNOSIS — R0989 Other specified symptoms and signs involving the circulatory and respiratory systems: Secondary | ICD-10-CM

## 2018-03-02 NOTE — Telephone Encounter (Signed)
Call pt to confirm that he is aware of his appt with Jory Sims, DNP today @ 2pm. lmom with appt date, time , and location. Pt is to contact the office if he will need to reschedule.

## 2018-03-02 NOTE — Telephone Encounter (Signed)
Needs echo MCr

## 2018-03-02 NOTE — Progress Notes (Deleted)
Cardiology Office Note   Date:  03/02/2018   ID:  Alan [name], DOB 04-Sep-1950, MRN [id number]  PCP:  [name], MD  Cardiologist: [place] No chief complaint on file.    History of Present Illness: Alan [name] is a 68 y.o. male who presents for ongoing assessment and management of of severe AoV insufficiency, s/p AoV replacement with mitral valve repair along with LIMA to LAD incidentally found on pre-operative cath. He was also found to have 90% stenosis of the distal segment of the PDA. He had complaints of cold and numbness that engulfs the left side of his chest, left neck and left upper extremity when see last on 03/06/2016.    He was also noted to have a hx of embolic CVA, atrial flutter, no longer on anticoagulation or amiodarone, and depression.   He works at [employer] which is moderately physically stressful   It was felt that the numbness and cold feeling may be related to sequela of previous embolic CVA. He was recommended to be referred to neurologist. He was also given a Rx for NTG.A a nuclear stress test was considered but it was thought that it would really not change management approach since it is likely to confirm ischemia in the known territories of non-revascularized coronary stenosis. He was considered to be a candidate for ablation of atrial flutter recurred. Dr. Sallyanne Kuster felt it was best to continue current medical management to avoid costly tests for the patient unless he became symptomatic.   Past Medical History:  Diagnosis Date  . 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 Coronary artery disease involving native coronary artery without angina pectoris 10/23/2014  . 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 Enterococcal bacteremia 10/19/2014  . Hepatitis   . Memory difficulty 02/06/2017  . Migraine 09/12/2015  . Mitral valve  regurgitation, 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/2014  . Paroxysmal atrial fibrillation ([place]) 10/19/2014  . Paroxysmal atrial flutter ([place]) 10/19/2014  . Prostate cancer ([place])   . Protein-calorie malnutrition, severe ([place])   . Restless leg syndrome   . 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/2014   23 mm [facility] Ease bovine pericardial bioprosthetic tissue valve with bovine pericardial patch enlargement of aortic root  . 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 S/P mitral valve repair 11/01/2014   Debridement of vegetation on anterior leaflet  . 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 Stroke, embolic ([employer]) 0/25/8527   Multiple small subclinical embolic infarcts noted on MRI c/w septic emboli    Past Surgical History:  Procedure Laterality Date  . AORTIC ROOT ENLARGEMENT N/A 11/01/2014   Procedure: AORTIC ROOT ENLARGEMENT;  Surgeon: Rexene Alberts, MD;  Location: [place];  Service: Open Heart Surgery;  Laterality: N/A;  . AORTIC VALVE REPLACEMENT N/A 11/01/2014   Procedure: AORTIC VALVE REPLACEMENT (AVR);  Surgeon: Rexene Alberts, MD;  Location: [place];  Service: Open Heart Surgery;  Laterality: N/A;  . CARDIAC CATHETERIZATION N/A 10/23/2014   Procedure: Right/Left Heart Cath and Coronary Angiography;  Surgeon: Lorretta Harp, MD;  Location: [place] CV LAB;  Service: Cardiovascular;  Laterality: N/A;  . CARDIOVERSION N/A 12/18/2014   Procedure: CARDIOVERSION;  Surgeon: Pixie Casino, MD;  Location: [facility] ENDOSCOPY;  Service: Cardiovascular;  Laterality: N/A;  . CARDIOVERSION N/A 02/06/2015   Procedure: CARDIOVERSION;  Surgeon:  Larey Dresser, MD;  Location: [place];  Service: Cardiovascular;  Laterality: N/A;  . COLONOSCOPY N/A 10/25/2014   Procedure: COLONOSCOPY;  Surgeon: Arta Silence, MD;  Location: [facility] ENDOSCOPY;  Service: Endoscopy;  Laterality: N/A;  . CORONARY ARTERY BYPASS GRAFT N/A 11/01/2014   Procedure: CORONARY  ARTERY BYPASS GRAFTING (CABG) x 1 using internal mammary artery;  Surgeon: Rexene Alberts, MD;  Location: [place];  Service: Open Heart Surgery;  Laterality: N/A;  . MITRAL VALVE REPAIR N/A 11/01/2014   Procedure: MITRAL VALVE REPAIR with no Ring;  Surgeon: Rexene Alberts, MD;  Location: [place];  Service: Open Heart Surgery;  Laterality: N/A;  . PROSTATECTOMY    . TEE WITHOUT CARDIOVERSION N/A 10/20/2014   Procedure: TRANSESOPHAGEAL ECHOCARDIOGRAM (TEE);  Surgeon: Josue Hector, MD;  Location: [place];  Service: Cardiovascular;  Laterality: N/A;  . TEE WITHOUT CARDIOVERSION N/A 11/01/2014   Procedure: TRANSESOPHAGEAL ECHOCARDIOGRAM (TEE);  Surgeon: Rexene Alberts, MD;  Location: [place];  Service: Open Heart Surgery;  Laterality: N/A;     Current Outpatient Medications  Medication Sig Dispense Refill  . aspirin-acetaminophen-caffeine (EXCEDRIN MIGRAINE) 250-250-65 MG tablet Take 2 tablets by mouth daily as needed for migraine.    . divalproex (DEPAKOTE) 250 MG DR tablet TAKE 1 TABLET BY MOUTH TWICE DAILY FOR 2 WEEKS, THEN START 2 TABLETS TWICE DAILY 120 tablet 0  . gabapentin (NEURONTIN) 300 MG capsule Take 300 mg by mouth 2 (two) times daily. Take 1 cap with dinner and then another cap at bedtime    . Multiple Vitamins-Minerals (PRESERVISION AREDS 2) CAPS Take 1 capsule by mouth 2 (two) times daily.    . sertraline (ZOLOFT) 50 MG tablet Take 1 tablet (50 mg total) by mouth at bedtime. 30 tablet 3  . topiramate (TOPAMAX) 100 MG tablet Take 1 tablet (100 mg total) by mouth at bedtime. 90 tablet 3   No current facility-administered medications for this visit.     Allergies:   Pollen extract    Social History:  The patient  reports that he has quit smoking. He has never used smokeless tobacco. He reports that he does not drink alcohol or use drugs.   Family History:  The patient's family history includes Stroke in his father.    ROS: All other systems are reviewed and negative. Unless  otherwise mentioned in H&P    PHYSICAL EXAM: VS:  There were no vitals taken for this visit. , BMI There is no height or weight on file to calculate BMI. GEN: Well nourished, well developed, in no acute distress HEENT: normal Neck: no JVD, carotid bruits, or masses Cardiac: ***RRR; no murmurs, rubs, or gallops,no edema  Respiratory:  Clear to auscultation bilaterally, normal work of breathing GI: soft, nontender, nondistended, + BS MS: no deformity or atrophy Skin: warm and dry, no rash Neuro:  Strength and sensation are intact Psych: euthymic mood, full affect   EKG:  EKG {ACTION; IS/IS [id number]} ordered today. The ekg ordered today demonstrates ***   Recent Labs: No results found for requested labs within last 8760 hours.    Lipid Panel    Component Value Date/Time   CHOL 154 03/06/2016 0954   TRIG 71 03/06/2016 0954   HDL 45 03/06/2016 0954   CHOLHDL 3.4 03/06/2016 0954   VLDL 14 03/06/2016 0954   LDLCALC 95 03/06/2016 0954      Wt Readings from Last 3 Encounters:  02/06/17 169 lb (76.7 kg)  03/06/16 167 lb 3.2 oz (  75.8 kg)  09/12/15 176 lb 8 oz (80.1 kg)      Other studies Reviewed: Echo 01/23/15 - Left ventricle: The cavity size was normal. There was severe concentric hypertrophy. Systolic function was normal. Theestimated ejection fraction was in the range of 60% to 65%. Wallmotion was normal; there were no regional wall motionabnormalities. The study is not technically sufficient to allowevaluation of LV diastolic function. - Aortic valve: A bioprosthesis was present. The sewing ringappeared normal. Transvalvular velocity was within the normalrange. There was no stenosis. Peak velocity (S): 197 cm/s. Meangradient (S): 8 mm Hg. - Mitral valve: Prior procedures included surgical repair, thoughno surgical ring is appreciated. The findings are consistent withmild stenosis. There was mild regurgitation. Mean gradient (D): 26mm Hg. - Left atrium: The  atrium was severely dilated. - Right ventricle: The cavity size was normal. Wall thickness wasnormal. Systolic function was moderately reduced. - Right atrium: The atrium was severely dilated. - Tricuspid valve: There was mild regurgitation. - Pulmonary arteries: PA peak pressure: 37 mm Hg (S).  CATH 10/23/2014  Ost LAD lesion, 60% stenosed.  Prox LAD to Mid LAD lesion, 70% stenosed.  Ost LPDA to LPDA lesion, 90% stenosed.  Ost 2nd Mrg to 2nd Mrg lesion, 70% stenosed.  2nd Mrg lesion, 50% stenosed. ASSESSMENT AND PLAN:  1.  ***   Current medicines are reviewed at length with the patient today.    Labs/ tests ordered today include: *** Phill Myron. [place] Pugh, ANP, AACC   03/02/2018 7:12 AM    [place] [address] Office [phone] Fax [phone]

## 2018-03-02 NOTE — Telephone Encounter (Signed)
The patient was a No-Show

## 2018-03-08 ENCOUNTER — Encounter: Payer: Self-pay | Admitting: Adult Health

## 2018-03-09 ENCOUNTER — Ambulatory Visit: Payer: Medicare Other | Admitting: Adult Health

## 2018-03-17 ENCOUNTER — Ambulatory Visit (INDEPENDENT_AMBULATORY_CARE_PROVIDER_SITE_OTHER): Payer: Medicare Other | Admitting: Cardiovascular Disease

## 2018-03-17 ENCOUNTER — Encounter: Payer: Self-pay | Admitting: Cardiovascular Disease

## 2018-03-17 VITALS — BP 148/100 | HR 119 | Ht 72.0 in | Wt 173.0 lb

## 2018-03-17 DIAGNOSIS — Z953 Presence of xenogenic heart valve: Secondary | ICD-10-CM

## 2018-03-17 DIAGNOSIS — I25119 Atherosclerotic heart disease of native coronary artery with unspecified angina pectoris: Secondary | ICD-10-CM

## 2018-03-17 DIAGNOSIS — I484 Atypical atrial flutter: Secondary | ICD-10-CM

## 2018-03-17 DIAGNOSIS — Z9889 Other specified postprocedural states: Secondary | ICD-10-CM | POA: Diagnosis not present

## 2018-03-17 DIAGNOSIS — F339 Major depressive disorder, recurrent, unspecified: Secondary | ICD-10-CM

## 2018-03-17 MED ORDER — METOPROLOL SUCCINATE ER 25 MG PO TB24: 25.0000 mg | ORAL_TABLET | Freq: Every day | ORAL | 5 refills | Status: DC

## 2018-03-17 MED ORDER — APIXABAN 5 MG PO TABS: 5.0000 mg | ORAL_TABLET | Freq: Two times a day (BID) | ORAL | 11 refills | Status: DC

## 2018-03-17 NOTE — Patient Instructions (Addendum)
Medication Instructions:  START Eliquis 5mg  twice daily. An Rx has been sent to your pharmacy  START Metoprolol Succinate 25mg  daily. An Rx has been sent to your pharmacy  If you need a refill on your cardiac medications before your next appointment, please call your pharmacy.   Lab work: Your physician recommends that you return for lab work a couple of days prior to your scheduled cardioversion (Bmet)  If you have labs (blood work) drawn today and your tests are completely normal, you will receive your results only by: Marland Kitchen MyChart Message (if you have MyChart) OR . A paper copy in the mail If you have any lab test that is abnormal or we need to change your treatment, we will call you to review the results.  Testing/Procedures: Your physician has requested that you have an echocardiogram. Echocardiography is a painless test that uses sound waves to create images of your heart. It provides your doctor with information about the size and shape of your heart and how well your heart's chambers and valves are working. This procedure takes approximately one hour. There are no restrictions for this procedure.     Your physician has recommended that you have a Cardioversion (DCCV). Electrical Cardioversion uses a jolt of electricity to your heart either through paddles or wired patches attached to your chest. This is a controlled, usually prescheduled, procedure. Defibrillation is done under light anesthesia in the hospital, and you usually go home the day of the procedure. This is done to get your heart back into a normal rhythm. You are not awake for the procedure. Please see the instruction sheet given to you today.    Follow-Up: At [facility], you and your health needs are our priority.  As part of our continuing mission to provide you with exceptional heart care, we have created designated Provider Care Teams.  These Care Teams include your primary Cardiologist (physician) and Advanced  Practice Providers (APPs -  Physician Assistants and Nurse Practitioners) who all work together to provide you with the care you need, when you need it. You will need a follow up appointment in 6 weeks.   Any Other Special Instructions Will Be Listed Below (If Applicable).   You are scheduled for a Cardioversion on 04/07/18 with Dr. Sallyanne Kuster.  Please arrive at the [facility] (Main Entrance A) at [facility]: [address] [place], [place] [address] at 1 pm. (1 hour prior to procedure unless lab work is needed; if lab work is needed arrive 1.5 hours ahead)  DIET: Nothing to eat or drink after midnight except a sip of water with medications (see medication instructions below)  Medication Instructions:   Continue your anticoagulant: Eliquis You will need to continue your anticoagulant after your procedure until you are told by your  Provider that it is safe to stop   Come to: [facility] a couple of days prior to your procedure   You must have a responsible person to drive you home and stay in the waiting area during your procedure. Failure to do so could result in cancellation.  Bring your insurance cards.  *Special Note: Every effort is made to have your procedure done on time. Occasionally there are emergencies that occur at the hospital that may cause delays. Please be patient if a delay does occur.

## 2018-03-17 NOTE — Progress Notes (Signed)
Patient ID: Alan [name], male   DOB: 15-Oct-1950, 68 y.o.   MRN: [id number] Patient ID: Alan [name], male   DOB: 04/14/1950, 68 y.o.   MRN: [id number]    Cardiology Office Note    Date:  03/20/2018   ID:  Alan [name], DOB Aug 05, 1950, MRN [id number]  PCP:  Camille Bal, PA-C  Cardiologist:   Sanda Klein, MD   Chief Complaint  Patient presents with  . Shortness of Breath    pt states it started about two months ago     History of Present Illness:  Alan [name] is a 68 y.o. 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 He underwent aortic valve replacement with a biological prosthesis and mitral valve repair as well as a single-vessel LIMA to LAD bypass for incidentally discovered CAD. He also had a 90% stenosis in the distal segment of the left PDA that was not bypassed.   Presents with shortness of breath that began approximately 2 weeks ago he noticed particular difficulty when he has to walk uphill.  He did not have angina and was not aware of palpitations.  Today he presents in atrial flutter with 2: 1 AV block at a ventricular rate of 120 bpm.  She is not aware of tachycardia.  Paradoxically, his symptoms have improved over the last couple of days and he feels back to baseline.  He has always had problems with depressed mood, but is doing reasonably well right now.  He is happy to be working in [employer] again.  No real sequelae from his strokes.  He occasionally has some issues with wobbly gait.  On November 01, 2014, he underwent AVR with a bioprosthesis (23 mm MagnaEase),patch enlargement of the aortic root (Nicks' technique) and MV repair without annuloplasty ring, as well as single-vessel LIMA to LAD bypass for incidentally discovered CAD and clipping of the left atrial appendage (40 mm Atricure  clip). Postop atrial flutter led to treatment with amiodarone and cardioversion on October 24 and a second cardioversion on January 11, 2015.  As the arrhythmia did not recur  anticoagulants were then discontinued.  Echo 01/23/15 - Left ventricle: The cavity size was normal. There was severe concentric hypertrophy. Systolic function was normal. Theestimated ejection fraction was in the range of 60% to 65%. Wallmotion was normal; there were no regional wall motionabnormalities. The study is not technically sufficient to allowevaluation of LV diastolic function. - Aortic valve: A bioprosthesis was present. The sewing ringappeared normal. Transvalvular velocity was within the normalrange. There was no stenosis. Peak velocity (S): 197 cm/s. Meangradient (S): 8 mm Hg. - Mitral valve: Prior procedures included surgical repair, thoughno surgical ring is appreciated. The findings are consistent withmild stenosis. There was mild regurgitation. Mean gradient (D): 64mm Hg. - Left atrium: The atrium was severely dilated. - Right ventricle: The cavity size was normal. Wall thickness wasnormal. Systolic function was moderately reduced. - Right atrium: The atrium was severely dilated. - Tricuspid valve: There was mild regurgitation. - Pulmonary arteries: PA peak pressure: 37 mm Hg (S).  CATH 10/23/2014  Ost LAD lesion, 60% stenosed.  Prox LAD to Mid LAD lesion, 70% stenosed.  Ost LPDA to LPDA lesion, 90% stenosed.  Ost 2nd Mrg to 2nd Mrg lesion, 70% stenosed.  2nd Mrg lesion, 50% stenosed.     Past Medical History:  Diagnosis Date  .  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 Coronary artery disease involving native coronary artery without angina pectoris 10/23/2014  . 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 Enterococcal bacteremia 10/19/2014  . Hepatitis   . Memory difficulty 02/06/2017  . Migraine  09/12/2015  . 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 Paroxysmal atrial fibrillation ([place]) 10/19/2014  . Paroxysmal atrial flutter ([place]) 10/19/2014  . Prostate cancer ([place])   . Protein-calorie malnutrition, severe ([place])   . Restless leg syndrome   . 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/2014   23 mm [facility] Ease bovine pericardial bioprosthetic tissue valve with bovine pericardial patch enlargement of aortic root  . 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 S/P mitral valve repair 11/01/2014   Debridement of vegetation on anterior leaflet  . 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 AORTIC ROOT ENLARGEMENT N/A 11/01/2014   Procedure: AORTIC ROOT ENLARGEMENT;  Surgeon: Rexene Alberts, MD;  Location: [place];  Service: Open Heart Surgery;  Laterality: N/A;  . AORTIC VALVE REPLACEMENT N/A 11/01/2014   Procedure: AORTIC VALVE REPLACEMENT (AVR);  Surgeon: Rexene Alberts, MD;  Location: [place];  Service: Open Heart Surgery;  Laterality: N/A;  . CARDIAC CATHETERIZATION N/A 10/23/2014   Procedure: Right/Left Heart Cath and Coronary Angiography;  Surgeon: Lorretta Harp, MD;  Location: [place] CV LAB;  Service: Cardiovascular;  Laterality: N/A;  . CARDIOVERSION N/A 12/18/2014   Procedure: CARDIOVERSION;  Surgeon: Pixie Casino, MD;  Location: [facility] ENDOSCOPY;  Service: Cardiovascular;  Laterality: N/A;  . CARDIOVERSION N/A 02/06/2015   Procedure: CARDIOVERSION;  Surgeon: Larey Dresser, MD;  Location: [place];  Service: Cardiovascular;  Laterality: N/A;  . COLONOSCOPY N/A 10/25/2014   Procedure: COLONOSCOPY;  Surgeon: Arta Silence, MD;  Location: [facility] ENDOSCOPY;  Service: Endoscopy;  Laterality: N/A;  . CORONARY ARTERY BYPASS GRAFT N/A  11/01/2014   Procedure: CORONARY ARTERY BYPASS GRAFTING (CABG) x 1 using internal mammary artery;  Surgeon: Rexene Alberts, MD;  Location: [place];  Service: Open Heart Surgery;  Laterality: N/A;  . MITRAL VALVE REPAIR N/A 11/01/2014   Procedure: MITRAL VALVE REPAIR with no Ring;  Surgeon: Rexene Alberts, MD;  Location: [place];  Service: Open Heart Surgery;  Laterality: N/A;  . PROSTATECTOMY    . TEE WITHOUT CARDIOVERSION N/A 10/20/2014   Procedure: TRANSESOPHAGEAL ECHOCARDIOGRAM (TEE);  Surgeon: Josue Hector, MD;  Location: [place];  Service: Cardiovascular;  Laterality: N/A;  . TEE WITHOUT CARDIOVERSION N/A 11/01/2014   Procedure: TRANSESOPHAGEAL ECHOCARDIOGRAM (TEE);  Surgeon: Rexene Alberts, MD;  Location: [place];  Service: Open Heart Surgery;  Laterality: N/A;    Current Outpatient Medications  Medication Sig Dispense Refill  . aspirin-acetaminophen-caffeine (EXCEDRIN MIGRAINE) 250-250-65 MG tablet Take 2 tablets by mouth daily as needed for migraine.    . divalproex (DEPAKOTE) 250 MG DR tablet TAKE 1 TABLET BY MOUTH TWICE DAILY FOR 2 WEEKS, THEN START 2 TABLETS TWICE DAILY 120 tablet 0  . gabapentin (NEURONTIN) 300 MG capsule Take 300 mg by mouth 2 (two) times daily. Take 1 cap with dinner and then another cap at bedtime    . Multiple Vitamins-Minerals (PRESERVISION  AREDS 2) CAPS Take 1 capsule by mouth 2 (two) times daily.    . sertraline (ZOLOFT) 50 MG tablet Take 1 tablet (50 mg total) by mouth at bedtime. 30 tablet 3  . topiramate (TOPAMAX) 100 MG tablet Take 1 tablet (100 mg total) by mouth at bedtime. 90 tablet 3  . metoprolol succinate (TOPROL XL) 25 MG 24 hr tablet Take 1 tablet (25 mg total) by mouth daily. 30 tablet 5  . rivaroxaban (XARELTO) 20 MG TABS tablet Take 1 tablet (20 mg total) by mouth daily with supper. 30 tablet 5   No current facility-administered medications for this visit.     Allergies:   Pollen extract   Social History   Socioeconomic History  . Marital  status: Married    Spouse name: Not on file  . Number of children: 1  . Years of education: Master's  . Highest education level: Not on file  Occupational History  . Occupation: Economist: [employer]  . Financial resource strain: Not on file  . Food insecurity:    Worry: Not on file    Inability: Not on file  . Transportation needs:    Medical: Not on file    Non-medical: Not on file  Tobacco Use  . Smoking status: Former Research scientist (life sciences)  . Smokeless tobacco: Never Used  . Tobacco comment: Smoked a pipe age 45->28  Substance and Sexual Activity  . Alcohol use: No    Alcohol/week: 0.0 standard drinks    Comment: Drank from age 88->38  . Drug use: No  . Sexual activity: Not on file  Lifestyle  . Physical activity:    Days per week: Not on file    Minutes per session: Not on file  . Stress: Not on file  Relationships  . Social connections:    Talks on phone: Not on file    Gets together: Not on file    Attends religious service: Not on file    Active member of club or organization: Not on file    Attends meetings of clubs or organizations: Not on file    Relationship status: Not on file  Other Topics Concern  . Not on file  Social History Narrative   Lives at home w/ his wife   Right-handed   Caffeine: 2 cups daily     Family History:  The patient's family history includes Stroke in his father.   ROS:   Please see the history of present illness.    ROS All other systems reviewed and are negative.   PHYSICAL EXAM:   VS:  BP (!) 148/100   Pulse (!) 119   Ht 6' (1.829 m)   Wt 173 lb (78.5 kg)   BMI 23.46 kg/m    General: Alert, oriented x3, no distress Head: no evidence of trauma, PERRL, EOMI, no exophtalmos or lid lag, no myxedema, no xanthelasma; normal ears, nose and oropharynx Neck: normal jugular venous pulsations and no hepatojugular reflux; brisk carotid pulses without delay and no carotid bruits Chest: clear to auscultation, no signs of  consolidation by percussion or palpation, normal fremitus, symmetrical and full respiratory excursions. Well-healed sternotomy Cardiovascular: normal position and quality of the apical impulse, regular rhythm, normal first and second heart sounds, 2-2/6 Aortic systolic ejection murmur is early peaking, no diastolic murmurs, rubs or gallops Abdomen: no tenderness or distention, no masses by palpation, no abnormal pulsatility or arterial bruits, normal bowel sounds, no hepatosplenomegaly Extremities: no clubbing,  cyanosis or edema; 2+ radial, ulnar and brachial pulses bilaterally; 2+ right femoral, posterior tibial and dorsalis pedis pulses; 2+ left femoral, posterior tibial and dorsalis pedis pulses; no subclavian or femoral bruits Neurological: grossly nonfocal, Alert and Oriented x 3, Strength and sensation are intact Psych: depressed mood  Skin: warm and dry, no rash    Wt Readings from Last 3 Encounters:  03/17/18 173 lb (78.5 kg)  02/06/17 169 lb (76.7 kg)  03/06/16 167 lb 3.2 oz (75.8 kg)      Studies/Labs Reviewed:   EKG:  EKG is not ordered today.   Recent Labs: No results found for requested labs within last 8760 hours.   Lipid Panel    Component Value Date/Time   CHOL 154 03/06/2016 0954   TRIG 71 03/06/2016 0954   HDL 45 03/06/2016 0954   CHOLHDL 3.4 03/06/2016 0954   VLDL 14 03/06/2016 0954   LDLCALC 95 03/06/2016 0954      ASSESSMENT:    1. Atypical atrial flutter ([place])   2. History of aortic valve replacement with bioprosthetic valve   3. H/O mitral valve repair   4. Status post ligation of left atrial appendage   5. Coronary artery disease involving native coronary artery of native heart with angina pectoris ([place])   6. Recurrent major depressive disorder, remission status unspecified ([place])      PLAN:  In order of problems listed above:   1. AFlutter: First recurrence since the immediate postoperative period.  The appearance on ECG does raise some  concern that this may be left atrial flutter (related to atriotomy), rather than typical cavotricuspid isthmus flutter.  I suspect the atrial flutter is the reason he has developed exertional dyspnea and recommend cardioversion.  In the meantime, we will see if beta-blockers help with rate control.  His blood pressure is unusually high for him today.  Usually his blood pressure is quite low.  We will start a low-dose of beta-blocker. He had his left atrial appendage clipped at the time of surgery, but I believe it is safest if we do provide anticoagulation at least for the 3 weeks before and 1 month after cardioversion.  Initially prescribed Eliquis, but this is not covered by his insurance and we will switch him to Xarelto. I would like to avoid restarting amiodarone.  If atrial flutter recurs, he should be evaluated by EP to map the reentrant circuit and see if he is a good candidate for ablation.  2-3. S/P AVR (bio) and MV repair: Reevaluate by echo between now and his cardioversion.  Reminded him that he needs endocarditis prophylaxis for dental and surgical procedures. 4. LA appendage clipped: Lessens the likelihood of thromboembolic events.  Once he is outside the immediate post cardioversion.  I think we will discontinue anticoagulants. 5. CAD: This was never symptomatic and was discovered incidentally at his pre-surgery cardiac catheterization. 6. Depression: Seems to be well compensated on multiple medications.  Medication Adjustments/Labs and Tests Ordered: Current medicines are reviewed at length with the patient today.  Concerns regarding medicines are outlined above.  Medication changes, Labs and Tests ordered today are listed below. Patient Instructions  Medication Instructions:  START Eliquis 5mg  twice daily. An Rx has been sent to your pharmacy  START Metoprolol Succinate 25mg  daily. An Rx has been sent to your pharmacy  If you need a refill on your cardiac medications before your  next appointment, please call your pharmacy.   Lab work: Your physician recommends that you return  for lab work a couple of days prior to your scheduled cardioversion (Bmet)  If you have labs (blood work) drawn today and your tests are completely normal, you will receive your results only by: Marland Kitchen MyChart Message (if you have MyChart) OR . A paper copy in the mail If you have any lab test that is abnormal or we need to change your treatment, we will call you to review the results.  Testing/Procedures: Your physician has requested that you have an echocardiogram. Echocardiography is a painless test that uses sound waves to create images of your heart. It provides your doctor with information about the size and shape of your heart and how well your heart's chambers and valves are working. This procedure takes approximately one hour. There are no restrictions for this procedure.     Your physician has recommended that you have a Cardioversion (DCCV). Electrical Cardioversion uses a jolt of electricity to your heart either through paddles or wired patches attached to your chest. This is a controlled, usually prescheduled, procedure. Defibrillation is done under light anesthesia in the hospital, and you usually go home the day of the procedure. This is done to get your heart back into a normal rhythm. You are not awake for the procedure. Please see the instruction sheet given to you today.    Follow-Up: At [facility], you and your health needs are our priority.  As part of our continuing mission to provide you with exceptional heart care, we have created designated Provider Care Teams.  These Care Teams include your primary Cardiologist (physician) and Advanced Practice Providers (APPs -  Physician Assistants and Nurse Practitioners) who all work together to provide you with the care you need, when you need it. You will need a follow up appointment in 6 weeks.   Any Other Special Instructions  Will Be Listed Below (If Applicable).   You are scheduled for a Cardioversion on 04/07/18 with Dr. Sallyanne Kuster.  Please arrive at the [facility] (Main Entrance A) at [facility]: [address] [place], [place] [address] at 1 pm. (1 hour prior to procedure unless lab work is needed; if lab work is needed arrive 1.5 hours ahead)  DIET: Nothing to eat or drink after midnight except a sip of water with medications (see medication instructions below)  Medication Instructions:   Continue your anticoagulant: Eliquis You will need to continue your anticoagulant after your procedure until you are told by your  Provider that it is safe to stop   Come to: [facility] a couple of days prior to your procedure   You must have a responsible person to drive you home and stay in the waiting area during your procedure. Failure to do so could result in cancellation.  Bring your insurance cards.  *Special Note: Every effort is made to have your procedure done on time. Occasionally there are emergencies that occur at the hospital that may cause delays. Please be patient if a delay does occur.         Signed, Sanda Klein, MD  03/20/2018 2:18 PM    [address], [place], [place]  [address] Phone: [phone]; Fax: [phone]

## 2018-03-17 NOTE — H&P (View-Only) (Signed)
Patient ID: Alan [name], male   DOB: [birth year], 68 y.o.   MRN: [id number] Patient ID: Alan [name], male   DOB: [birth year], 68 y.o.   MRN: [id number]    Cardiology Office Note    Date:  03/20/2018   ID:  Alan [name], DOB [birth year], MRN [id number]  PCP:  Camille Bal, PA-C  Cardiologist:   Sanda Klein, MD   Chief Complaint  Patient presents with  . Shortness of Breath    pt states it started about two months ago     History of Present Illness:  Alan [name] is a 68 y.o. 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 He underwent aortic valve replacement with a biological prosthesis and mitral valve repair as well as a single-vessel LIMA to LAD bypass for incidentally discovered CAD. He also had a 90% stenosis in the distal segment of the left PDA that was not bypassed.   Presents with shortness of breath that began approximately 2 weeks ago he noticed particular difficulty when he has to walk uphill.  He did not have angina and was not aware of palpitations.  Today he presents in atrial flutter with 2: 1 AV block at a ventricular rate of 120 bpm.  She is not aware of tachycardia.  Paradoxically, his symptoms have improved over the last couple of days and he feels back to baseline.  He has always had problems with depressed mood, but is doing reasonably well right now.  He is happy to be working in [employer] again.  No real sequelae from his strokes.  He occasionally has some issues with wobbly gait.  On November 01, 2014, he underwent AVR with a bioprosthesis (23 mm MagnaEase),patch enlargement of the aortic root (Nicks' technique) and MV repair without annuloplasty ring, as well as single-vessel LIMA to LAD bypass for incidentally discovered CAD and clipping of the left atrial appendage (40 mm Atricure  clip). Postop atrial flutter led to treatment with amiodarone and cardioversion on October 24 and a second cardioversion on January 11, 2015.  As the arrhythmia did not recur  anticoagulants were then discontinued.  Echo 01/23/15 - Left ventricle: The cavity size was normal. There was severe concentric hypertrophy. Systolic function was normal. Theestimated ejection fraction was in the range of 60% to 65%. Wallmotion was normal; there were no regional wall motionabnormalities. The study is not technically sufficient to allowevaluation of LV diastolic function. - Aortic valve: A bioprosthesis was present. The sewing ringappeared normal. Transvalvular velocity was within the normalrange. There was no stenosis. Peak velocity (S): 197 cm/s. Meangradient (S): 8 mm Hg. - Mitral valve: Prior procedures included surgical repair, thoughno surgical ring is appreciated. The findings are consistent withmild stenosis. There was mild regurgitation. Mean gradient (D): 1mm Hg. - Left atrium: The atrium was severely dilated. - Right ventricle: The cavity size was normal. Wall thickness wasnormal. Systolic function was moderately reduced. - Right atrium: The atrium was severely dilated. - Tricuspid valve: There was mild regurgitation. - Pulmonary arteries: PA peak pressure: 37 mm Hg (S).  CATH 10/23/2014  Ost LAD lesion, 60% stenosed.  Prox LAD to Mid LAD lesion, 70% stenosed.  Ost LPDA to LPDA lesion, 90% stenosed.  Ost 2nd Mrg to 2nd Mrg lesion, 70% stenosed.  2nd Mrg lesion, 50% stenosed.     Past Medical History:  Diagnosis Date  .  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 Coronary artery disease involving native coronary artery without angina pectoris 10/23/2014  . 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 Enterococcal bacteremia 10/19/2014  . Hepatitis   . Memory difficulty 02/06/2017  . Migraine  09/12/2015  . 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 Paroxysmal atrial fibrillation ([place]) 10/19/2014  . Paroxysmal atrial flutter ([place]) 10/19/2014  . Prostate cancer ([place])   . Protein-calorie malnutrition, severe ([place])   . Restless leg syndrome   . 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/2014   23 mm [facility] Ease bovine pericardial bioprosthetic tissue valve with bovine pericardial patch enlargement of aortic root  . 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 S/P mitral valve repair 11/01/2014   Debridement of vegetation on anterior leaflet  . 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 AORTIC ROOT ENLARGEMENT N/A 11/01/2014   Procedure: AORTIC ROOT ENLARGEMENT;  Surgeon: Rexene Alberts, MD;  Location: [place];  Service: Open Heart Surgery;  Laterality: N/A;  . AORTIC VALVE REPLACEMENT N/A 11/01/2014   Procedure: AORTIC VALVE REPLACEMENT (AVR);  Surgeon: Rexene Alberts, MD;  Location: [place];  Service: Open Heart Surgery;  Laterality: N/A;  . CARDIAC CATHETERIZATION N/A 10/23/2014   Procedure: Right/Left Heart Cath and Coronary Angiography;  Surgeon: Lorretta Harp, MD;  Location: [place] CV LAB;  Service: Cardiovascular;  Laterality: N/A;  . CARDIOVERSION N/A 12/18/2014   Procedure: CARDIOVERSION;  Surgeon: Pixie Casino, MD;  Location: [facility] ENDOSCOPY;  Service: Cardiovascular;  Laterality: N/A;  . CARDIOVERSION N/A 02/06/2015   Procedure: CARDIOVERSION;  Surgeon: Larey Dresser, MD;  Location: [place];  Service: Cardiovascular;  Laterality: N/A;  . COLONOSCOPY N/A 10/25/2014   Procedure: COLONOSCOPY;  Surgeon: Arta Silence, MD;  Location: [facility] ENDOSCOPY;  Service: Endoscopy;  Laterality: N/A;  . CORONARY ARTERY BYPASS GRAFT N/A  11/01/2014   Procedure: CORONARY ARTERY BYPASS GRAFTING (CABG) x 1 using internal mammary artery;  Surgeon: Rexene Alberts, MD;  Location: [place];  Service: Open Heart Surgery;  Laterality: N/A;  . MITRAL VALVE REPAIR N/A 11/01/2014   Procedure: MITRAL VALVE REPAIR with no Ring;  Surgeon: Rexene Alberts, MD;  Location: [place];  Service: Open Heart Surgery;  Laterality: N/A;  . PROSTATECTOMY    . TEE WITHOUT CARDIOVERSION N/A 10/20/2014   Procedure: TRANSESOPHAGEAL ECHOCARDIOGRAM (TEE);  Surgeon: Josue Hector, MD;  Location: [place];  Service: Cardiovascular;  Laterality: N/A;  . TEE WITHOUT CARDIOVERSION N/A 11/01/2014   Procedure: TRANSESOPHAGEAL ECHOCARDIOGRAM (TEE);  Surgeon: Rexene Alberts, MD;  Location: [place];  Service: Open Heart Surgery;  Laterality: N/A;    Current Outpatient Medications  Medication Sig Dispense Refill  . aspirin-acetaminophen-caffeine (EXCEDRIN MIGRAINE) 250-250-65 MG tablet Take 2 tablets by mouth daily as needed for migraine.    . divalproex (DEPAKOTE) 250 MG DR tablet TAKE 1 TABLET BY MOUTH TWICE DAILY FOR 2 WEEKS, THEN START 2 TABLETS TWICE DAILY 120 tablet 0  . gabapentin (NEURONTIN) 300 MG capsule Take 300 mg by mouth 2 (two) times daily. Take 1 cap with dinner and then another cap at bedtime    . Multiple Vitamins-Minerals (PRESERVISION  AREDS 2) CAPS Take 1 capsule by mouth 2 (two) times daily.    . sertraline (ZOLOFT) 50 MG tablet Take 1 tablet (50 mg total) by mouth at bedtime. 30 tablet 3  . topiramate (TOPAMAX) 100 MG tablet Take 1 tablet (100 mg total) by mouth at bedtime. 90 tablet 3  . metoprolol succinate (TOPROL XL) 25 MG 24 hr tablet Take 1 tablet (25 mg total) by mouth daily. 30 tablet 5  . rivaroxaban (XARELTO) 20 MG TABS tablet Take 1 tablet (20 mg total) by mouth daily with supper. 30 tablet 5   No current facility-administered medications for this visit.     Allergies:   Pollen extract   Social History   Socioeconomic History  . Marital  status: Married    Spouse name: Not on file  . Number of children: 1  . Years of education: Master's  . Highest education level: Not on file  Occupational History  . Occupation: Economist: [employer]  . Financial resource strain: Not on file  . Food insecurity:    Worry: Not on file    Inability: Not on file  . Transportation needs:    Medical: Not on file    Non-medical: Not on file  Tobacco Use  . Smoking status: Former Research scientist (life sciences)  . Smokeless tobacco: Never Used  . Tobacco comment: Smoked a pipe age 68->28  Substance and Sexual Activity  . Alcohol use: No    Alcohol/week: 0.0 standard drinks    Comment: Drank from age 89->38  . Drug use: No  . Sexual activity: Not on file  Lifestyle  . Physical activity:    Days per week: Not on file    Minutes per session: Not on file  . Stress: Not on file  Relationships  . Social connections:    Talks on phone: Not on file    Gets together: Not on file    Attends religious service: Not on file    Active member of club or organization: Not on file    Attends meetings of clubs or organizations: Not on file    Relationship status: Not on file  Other Topics Concern  . Not on file  Social History Narrative   Lives at home w/ his wife   Right-handed   Caffeine: 2 cups daily     Family History:  The patient's family history includes Stroke in his father.   ROS:   Please see the history of present illness.    ROS All other systems reviewed and are negative.   PHYSICAL EXAM:   VS:  BP (!) 148/100   Pulse (!) 119   Ht 6' (1.829 m)   Wt 173 lb (78.5 kg)   BMI 23.46 kg/m    General: Alert, oriented x3, no distress Head: no evidence of trauma, PERRL, EOMI, no exophtalmos or lid lag, no myxedema, no xanthelasma; normal ears, nose and oropharynx Neck: normal jugular venous pulsations and no hepatojugular reflux; brisk carotid pulses without delay and no carotid bruits Chest: clear to auscultation, no signs of  consolidation by percussion or palpation, normal fremitus, symmetrical and full respiratory excursions. Well-healed sternotomy Cardiovascular: normal position and quality of the apical impulse, regular rhythm, normal first and second heart sounds, 2-7/7 Aortic systolic ejection murmur is early peaking, no diastolic murmurs, rubs or gallops Abdomen: no tenderness or distention, no masses by palpation, no abnormal pulsatility or arterial bruits, normal bowel sounds, no hepatosplenomegaly Extremities: no clubbing,  cyanosis or edema; 2+ radial, ulnar and brachial pulses bilaterally; 2+ right femoral, posterior tibial and dorsalis pedis pulses; 2+ left femoral, posterior tibial and dorsalis pedis pulses; no subclavian or femoral bruits Neurological: grossly nonfocal, Alert and Oriented x 3, Strength and sensation are intact Psych: depressed mood  Skin: warm and dry, no rash    Wt Readings from Last 3 Encounters:  03/17/18 173 lb (78.5 kg)  02/06/17 169 lb (76.7 kg)  03/06/16 167 lb 3.2 oz (75.8 kg)      Studies/Labs Reviewed:   EKG:  EKG is not ordered today.   Recent Labs: No results found for requested labs within last 8760 hours.   Lipid Panel    Component Value Date/Time   CHOL 154 03/06/2016 0954   TRIG 71 03/06/2016 0954   HDL 45 03/06/2016 0954   CHOLHDL 3.4 03/06/2016 0954   VLDL 14 03/06/2016 0954   LDLCALC 95 03/06/2016 0954      ASSESSMENT:    1. Atypical atrial flutter ([place])   2. History of aortic valve replacement with bioprosthetic valve   3. H/O mitral valve repair   4. Status post ligation of left atrial appendage   5. Coronary artery disease involving native coronary artery of native heart with angina pectoris ([place])   6. Recurrent major depressive disorder, remission status unspecified ([place])      PLAN:  In order of problems listed above:   1. AFlutter: First recurrence since the immediate postoperative period.  The appearance on ECG does raise some  concern that this may be left atrial flutter (related to atriotomy), rather than typical cavotricuspid isthmus flutter.  I suspect the atrial flutter is the reason he has developed exertional dyspnea and recommend cardioversion.  In the meantime, we will see if beta-blockers help with rate control.  His blood pressure is unusually high for him today.  Usually his blood pressure is quite low.  We will start a low-dose of beta-blocker. He had his left atrial appendage clipped at the time of surgery, but I believe it is safest if we do provide anticoagulation at least for the 3 weeks before and 1 month after cardioversion.  Initially prescribed Eliquis, but this is not covered by his insurance and we will switch him to Xarelto. I would like to avoid restarting amiodarone.  If atrial flutter recurs, he should be evaluated by EP to map the reentrant circuit and see if he is a good candidate for ablation.  2-3. S/P AVR (bio) and MV repair: Reevaluate by echo between now and his cardioversion.  Reminded him that he needs endocarditis prophylaxis for dental and surgical procedures. 4. LA appendage clipped: Lessens the likelihood of thromboembolic events.  Once he is outside the immediate post cardioversion.  I think we will discontinue anticoagulants. 5. CAD: This was never symptomatic and was discovered incidentally at his pre-surgery cardiac catheterization. 6. Depression: Seems to be well compensated on multiple medications.  Medication Adjustments/Labs and Tests Ordered: Current medicines are reviewed at length with the patient today.  Concerns regarding medicines are outlined above.  Medication changes, Labs and Tests ordered today are listed below. Patient Instructions  Medication Instructions:  START Eliquis 5mg  twice daily. An Rx has been sent to your pharmacy  START Metoprolol Succinate 25mg  daily. An Rx has been sent to your pharmacy  If you need a refill on your cardiac medications before your  next appointment, please call your pharmacy.   Lab work: Your physician recommends that you return  for lab work a couple of days prior to your scheduled cardioversion (Bmet)  If you have labs (blood work) drawn today and your tests are completely normal, you will receive your results only by: Marland Kitchen MyChart Message (if you have MyChart) OR . A paper copy in the mail If you have any lab test that is abnormal or we need to change your treatment, we will call you to review the results.  Testing/Procedures: Your physician has requested that you have an echocardiogram. Echocardiography is a painless test that uses sound waves to create images of your heart. It provides your doctor with information about the size and shape of your heart and how well your heart's chambers and valves are working. This procedure takes approximately one hour. There are no restrictions for this procedure.     Your physician has recommended that you have a Cardioversion (DCCV). Electrical Cardioversion uses a jolt of electricity to your heart either through paddles or wired patches attached to your chest. This is a controlled, usually prescheduled, procedure. Defibrillation is done under light anesthesia in the hospital, and you usually go home the day of the procedure. This is done to get your heart back into a normal rhythm. You are not awake for the procedure. Please see the instruction sheet given to you today.    Follow-Up: At [facility], you and your health needs are our priority.  As part of our continuing mission to provide you with exceptional heart care, we have created designated Provider Care Teams.  These Care Teams include your primary Cardiologist (physician) and Advanced Practice Providers (APPs -  Physician Assistants and Nurse Practitioners) who all work together to provide you with the care you need, when you need it. You will need a follow up appointment in 6 weeks.   Any Other Special Instructions  Will Be Listed Below (If Applicable).   You are scheduled for a Cardioversion on 04/07/18 with Dr. Sallyanne Kuster.  Please arrive at the [facility] (Main Entrance A) at [facility]: [address] [place], [place] [address] at 1 pm. (1 hour prior to procedure unless lab work is needed; if lab work is needed arrive 1.5 hours ahead)  DIET: Nothing to eat or drink after midnight except a sip of water with medications (see medication instructions below)  Medication Instructions:   Continue your anticoagulant: Eliquis You will need to continue your anticoagulant after your procedure until you are told by your  Provider that it is safe to stop   Come to: [facility] a couple of days prior to your procedure   You must have a responsible person to drive you home and stay in the waiting area during your procedure. Failure to do so could result in cancellation.  Bring your insurance cards.  *Special Note: Every effort is made to have your procedure done on time. Occasionally there are emergencies that occur at the hospital that may cause delays. Please be patient if a delay does occur.         Signed, Sanda Klein, MD  03/20/2018 2:18 PM    [address], [place], [place]  [address] Phone: [phone]; Fax: [phone]

## 2018-03-18 ENCOUNTER — Other Ambulatory Visit: Payer: Self-pay | Admitting: *Deleted

## 2018-03-18 ENCOUNTER — Telehealth: Payer: Self-pay | Admitting: *Deleted

## 2018-03-18 DIAGNOSIS — I483 Typical atrial flutter: Secondary | ICD-10-CM

## 2018-03-18 NOTE — Telephone Encounter (Signed)
Left message for patient to call and schedule echo and lab work at [facility] and a 6 week post cardioversion (04/07/18) with Dr. Loletha Grayer

## 2018-03-19 ENCOUNTER — Encounter: Payer: Self-pay | Admitting: Cardiovascular Disease

## 2018-03-19 ENCOUNTER — Telehealth: Payer: Self-pay

## 2018-03-19 MED ORDER — RIVAROXABAN 20 MG PO TABS: 20.0000 mg | ORAL_TABLET | Freq: Every day | ORAL | 5 refills | Status: DC

## 2018-03-19 NOTE — Telephone Encounter (Signed)
Called pt lmtcb. Prior Auth received from the pt pharmacy for Eliquis that was prescribed by Dr.Croitoru on 03/17/18. Eliquis is not covered by his insurance. Xarelto is the preferred anti-coagulant on his insurances formulary. Dr.Croitoru is aware and ok with the switch. Pt is to be prescribed Xarelto 20mg  daily.  Pt was given a free 30 day card for Eliquis at his 03/17/18 o/v. Per Dr.C if pt was able to pick up the script he can continue Eliquis for 30 days and then switch to Xarelto. There should be no interruption in therapy, pt is scheduled for a cardioversion with Dr.C on 04/07/18.

## 2018-03-19 NOTE — Telephone Encounter (Signed)
New Message:    Patient calling concerning the price of his medication is to high for him. Please call patient back.

## 2018-03-19 NOTE — Telephone Encounter (Signed)
Spoke with patient and advised ok to switch to Xarelto, verbalized understanding. Per patient he has not started the Eliquis because he has used card previously. He will start Xarelto today.

## 2018-03-19 NOTE — Telephone Encounter (Signed)
This encounter was created in error - please disregard.

## 2018-03-19 NOTE — Telephone Encounter (Signed)
Left message for patient to call and schedule Echo and lab work at [facility] and post cardioversion visit with Dr. Loletha Grayer

## 2018-03-20 DIAGNOSIS — Z953 Presence of xenogenic heart valve: Secondary | ICD-10-CM | POA: Insufficient documentation

## 2018-03-20 DIAGNOSIS — I25119 Atherosclerotic heart disease of native coronary artery with unspecified angina pectoris: Secondary | ICD-10-CM | POA: Insufficient documentation

## 2018-03-20 DIAGNOSIS — Z9889 Other specified postprocedural states: Secondary | ICD-10-CM | POA: Insufficient documentation

## 2018-03-20 DIAGNOSIS — F339 Major depressive disorder, recurrent, unspecified: Secondary | ICD-10-CM | POA: Insufficient documentation

## 2018-03-22 ENCOUNTER — Encounter: Payer: Self-pay | Admitting: Cardiovascular Disease

## 2018-03-22 NOTE — Telephone Encounter (Signed)
Returned patient's call from 03/19/18 (after hours)---lm for patient to call and schedule echo and lab work at our [facility] location

## 2018-03-26 ENCOUNTER — Other Ambulatory Visit: Payer: Medicare Other | Admitting: *Deleted

## 2018-03-26 ENCOUNTER — Ambulatory Visit (HOSPITAL_COMMUNITY): Payer: Medicare Other | Attending: Cardiovascular Disease

## 2018-03-26 DIAGNOSIS — I484 Atypical atrial flutter: Secondary | ICD-10-CM | POA: Diagnosis present

## 2018-03-27 LAB — BASIC METABOLIC PANEL
BUN/Creatinine Ratio: 15 (ref 10–24)
BUN: 19 mg/dL (ref 8–27)
CO2: 24 mmol/L (ref 20–29)
Calcium: 9.8 mg/dL (ref 8.6–10.2)
Chloride: 105 mmol/L (ref 96–106)
Creatinine, Ser: 1.29 mg/dL — ABNORMAL HIGH (ref 0.76–1.27)
GFR calc Af Amer: 65 mL/min/{1.73_m2} (ref >59)
GFR, EST NON AFRICAN AMERICAN: 56 mL/min/{1.73_m2} — AB (ref >59)
Glucose: 127 mg/dL — ABNORMAL HIGH (ref 65–99)
POTASSIUM: 4.4 mmol/L (ref 3.5–5.2)
SODIUM: 144 mmol/L (ref 134–144)

## 2018-04-06 ENCOUNTER — Telehealth: Payer: Self-pay | Admitting: Cardiovascular Disease

## 2018-04-06 NOTE — Telephone Encounter (Signed)
New message   Patient would like a call back about procedures on tomorrow.

## 2018-04-06 NOTE — Telephone Encounter (Signed)
Spoke with patient, had a question about procedure time tomorrow. Arrival time and location given to patient

## 2018-04-07 ENCOUNTER — Other Ambulatory Visit: Payer: Self-pay

## 2018-04-07 ENCOUNTER — Encounter (HOSPITAL_COMMUNITY): Payer: Self-pay | Admitting: *Deleted

## 2018-04-07 ENCOUNTER — Ambulatory Visit (HOSPITAL_COMMUNITY): Payer: Medicare Other | Admitting: Anesthesiology

## 2018-04-07 ENCOUNTER — Inpatient Hospital Stay (HOSPITAL_COMMUNITY): Payer: Medicare Other

## 2018-04-07 ENCOUNTER — Inpatient Hospital Stay (HOSPITAL_COMMUNITY)
Admission: RE | Admit: 2018-04-07 | Discharge: 2018-04-13 | DRG: 246 | Disposition: A | Discharge status: Home / Self Care | Payer: Medicare Other | Attending: Cardiovascular Disease | Admitting: Cardiovascular Disease

## 2018-04-07 ENCOUNTER — Encounter (HOSPITAL_COMMUNITY)
Admission: RE | Disposition: A | Discharge status: Home / Self Care | Payer: Self-pay | Source: Home / Self Care | Attending: Cardiovascular Disease

## 2018-04-07 DIAGNOSIS — I081 Rheumatic disorders of both mitral and tricuspid valves: Secondary | ICD-10-CM | POA: Diagnosis present

## 2018-04-07 DIAGNOSIS — I2582 Chronic total occlusion of coronary artery: Secondary | ICD-10-CM | POA: Diagnosis present

## 2018-04-07 DIAGNOSIS — Z951 Presence of aortocoronary bypass graft: Secondary | ICD-10-CM | POA: Diagnosis not present

## 2018-04-07 DIAGNOSIS — G2581 Restless legs syndrome: Secondary | ICD-10-CM | POA: Diagnosis present

## 2018-04-07 DIAGNOSIS — Z87891 Personal history of nicotine dependence: Secondary | ICD-10-CM

## 2018-04-07 DIAGNOSIS — I340 Nonrheumatic mitral (valve) insufficiency: Secondary | ICD-10-CM | POA: Diagnosis not present

## 2018-04-07 DIAGNOSIS — Z8679 Personal history of other diseases of the circulatory system: Secondary | ICD-10-CM | POA: Diagnosis not present

## 2018-04-07 DIAGNOSIS — I5031 Acute diastolic (congestive) heart failure: Secondary | ICD-10-CM | POA: Diagnosis not present

## 2018-04-07 DIAGNOSIS — Z9861 Coronary angioplasty status: Secondary | ICD-10-CM

## 2018-04-07 DIAGNOSIS — I25119 Atherosclerotic heart disease of native coronary artery with unspecified angina pectoris: Secondary | ICD-10-CM | POA: Diagnosis present

## 2018-04-07 DIAGNOSIS — Z823 Family history of stroke: Secondary | ICD-10-CM | POA: Diagnosis not present

## 2018-04-07 DIAGNOSIS — I484 Atypical atrial flutter: Secondary | ICD-10-CM | POA: Diagnosis present

## 2018-04-07 DIAGNOSIS — Z862 Personal history of diseases of the blood and blood-forming organs and certain disorders involving the immune mechanism: Secondary | ICD-10-CM | POA: Diagnosis not present

## 2018-04-07 DIAGNOSIS — I4819 Other persistent atrial fibrillation: Secondary | ICD-10-CM | POA: Diagnosis present

## 2018-04-07 DIAGNOSIS — F339 Major depressive disorder, recurrent, unspecified: Secondary | ICD-10-CM | POA: Diagnosis present

## 2018-04-07 DIAGNOSIS — Z955 Presence of coronary angioplasty implant and graft: Secondary | ICD-10-CM

## 2018-04-07 DIAGNOSIS — Z7901 Long term (current) use of anticoagulants: Secondary | ICD-10-CM

## 2018-04-07 DIAGNOSIS — I9581 Postprocedural hypotension: Secondary | ICD-10-CM | POA: Diagnosis not present

## 2018-04-07 DIAGNOSIS — I4892 Unspecified atrial flutter: Secondary | ICD-10-CM | POA: Diagnosis present

## 2018-04-07 DIAGNOSIS — R579 Shock, unspecified: Secondary | ICD-10-CM | POA: Diagnosis not present

## 2018-04-07 DIAGNOSIS — I361 Nonrheumatic tricuspid (valve) insufficiency: Secondary | ICD-10-CM

## 2018-04-07 DIAGNOSIS — Z23 Encounter for immunization: Secondary | ICD-10-CM | POA: Diagnosis present

## 2018-04-07 DIAGNOSIS — R001 Bradycardia, unspecified: Secondary | ICD-10-CM | POA: Diagnosis present

## 2018-04-07 DIAGNOSIS — I471 Supraventricular tachycardia: Secondary | ICD-10-CM | POA: Diagnosis not present

## 2018-04-07 DIAGNOSIS — I2510 Atherosclerotic heart disease of native coronary artery without angina pectoris: Secondary | ICD-10-CM | POA: Diagnosis not present

## 2018-04-07 DIAGNOSIS — Z79899 Other long term (current) drug therapy: Secondary | ICD-10-CM | POA: Diagnosis not present

## 2018-04-07 DIAGNOSIS — I5033 Acute on chronic diastolic (congestive) heart failure: Secondary | ICD-10-CM

## 2018-04-07 DIAGNOSIS — I10 Essential (primary) hypertension: Secondary | ICD-10-CM | POA: Diagnosis present

## 2018-04-07 DIAGNOSIS — Z8619 Personal history of other infectious and parasitic diseases: Secondary | ICD-10-CM

## 2018-04-07 DIAGNOSIS — Z8673 Personal history of transient ischemic attack (TIA), and cerebral infarction without residual deficits: Secondary | ICD-10-CM

## 2018-04-07 DIAGNOSIS — Z952 Presence of prosthetic heart valve: Secondary | ICD-10-CM

## 2018-04-07 DIAGNOSIS — I483 Typical atrial flutter: Secondary | ICD-10-CM

## 2018-04-07 HISTORY — PX: CARDIOVERSION: SHX1299

## 2018-04-07 LAB — POCT I-STAT 4, (NA,K, GLUC, HGB,HCT)
Glucose, Bld: 89 mg/dL (ref 70–99)
HCT: 45 % (ref 39.0–52.0)
Hemoglobin: 15.3 g/dL (ref 13.0–17.0)
Potassium: 4.6 mmol/L (ref 3.5–5.1)
Sodium: 140 mmol/L (ref 135–145)

## 2018-04-07 LAB — COMPREHENSIVE METABOLIC PANEL
ALK PHOS: 30 U/L — AB (ref 38–126)
ALT: 26 U/L (ref 0–44)
AST: 34 U/L (ref 15–41)
Albumin: 3 g/dL — ABNORMAL LOW (ref 3.5–5.0)
Anion gap: 8 (ref 5–15)
BILIRUBIN TOTAL: 1.2 mg/dL (ref 0.3–1.2)
BUN: 19 mg/dL (ref 8–23)
CALCIUM: 8.2 mg/dL — AB (ref 8.9–10.3)
CO2: 23 mmol/L (ref 22–32)
Chloride: 110 mmol/L (ref 98–111)
Creatinine, Ser: 1.49 mg/dL — ABNORMAL HIGH (ref 0.61–1.24)
GFR calc Af Amer: 55 mL/min — ABNORMAL LOW (ref >60)
GFR calc non Af Amer: 47 mL/min — ABNORMAL LOW (ref >60)
Glucose, Bld: 106 mg/dL — ABNORMAL HIGH (ref 70–99)
Potassium: 4.5 mmol/L (ref 3.5–5.1)
Sodium: 141 mmol/L (ref 135–145)
Total Protein: 5 g/dL — ABNORMAL LOW (ref 6.5–8.1)

## 2018-04-07 LAB — ECHOCARDIOGRAM COMPLETE
Height: 72 in
Weight: 2768 oz

## 2018-04-07 LAB — CBC WITH DIFFERENTIAL/PLATELET
Abs Immature Granulocytes: 0.03 10*3/uL (ref 0.00–0.07)
Basophils Absolute: 0 10*3/uL (ref 0.0–0.1)
Basophils Relative: 0 %
Eosinophils Absolute: 0.1 10*3/uL (ref 0.0–0.5)
Eosinophils Relative: 1 %
HEMATOCRIT: 41.7 % (ref 39.0–52.0)
Hemoglobin: 13 g/dL (ref 13.0–17.0)
Immature Granulocytes: 0 %
LYMPHS PCT: 14 %
Lymphs Abs: 1.1 10*3/uL (ref 0.7–4.0)
MCH: 29 pg (ref 26.0–34.0)
MCHC: 31.2 g/dL (ref 30.0–36.0)
MCV: 92.9 fL (ref 80.0–100.0)
MONOS PCT: 9 %
Monocytes Absolute: 0.7 10*3/uL (ref 0.1–1.0)
Neutro Abs: 5.5 10*3/uL (ref 1.7–7.7)
Neutrophils Relative %: 76 %
Platelets: 154 10*3/uL (ref 150–400)
RBC: 4.49 MIL/uL (ref 4.22–5.81)
RDW: 13.7 % (ref 11.5–15.5)
WBC: 7.3 10*3/uL (ref 4.0–10.5)
nRBC: 0 % (ref 0.0–0.2)

## 2018-04-07 LAB — MRSA PCR SCREENING: MRSA by PCR: NEGATIVE

## 2018-04-07 LAB — TROPONIN I
Troponin I: <0.03 ng/mL (ref <0.03)
Troponin I: 0.03 ng/mL (ref <0.03)

## 2018-04-07 LAB — MAGNESIUM: Magnesium: 1.7 mg/dL (ref 1.7–2.4)

## 2018-04-07 SURGERY — CARDIOVERSION
Anesthesia: General

## 2018-04-07 MED ORDER — PHENYLEPHRINE HCL 10 MG/ML IJ SOLN
INTRAMUSCULAR | Status: DC | PRN
  Administered 2018-04-07 (×4): 100 ug via INTRAVENOUS

## 2018-04-07 MED ORDER — EPHEDRINE SULFATE 50 MG/ML IJ SOLN
INTRAMUSCULAR | Status: DC | PRN
  Administered 2018-04-07: 15 mg via INTRAVENOUS

## 2018-04-07 MED ORDER — SODIUM CHLORIDE 0.9 % IV SOLN
INTRAVENOUS | Status: DC
  Administered 2018-04-07 (×2): via INTRAVENOUS

## 2018-04-07 MED ORDER — RIVAROXABAN 20 MG PO TABS
20.0000 mg | ORAL_TABLET | Freq: Every day | ORAL | Status: DC
  Administered 2018-04-07 – 2018-04-08 (×2): 20 mg via ORAL
  Filled 2018-04-07 (×2): qty 1

## 2018-04-07 MED ORDER — SODIUM CHLORIDE 0.9 % IV SOLN
INTRAVENOUS | Status: DC
  Administered 2018-04-07: 20 mL/h via INTRAVENOUS

## 2018-04-07 MED ORDER — VASOPRESSIN 20 UNIT/ML IV SOLN
INTRAVENOUS | Status: DC | PRN
  Administered 2018-04-07: .5 [IU] via INTRAVENOUS

## 2018-04-07 MED ORDER — LIDOCAINE 2% (20 MG/ML) 5 ML SYRINGE
INTRAMUSCULAR | Status: DC | PRN
  Administered 2018-04-07: 100 mg via INTRAVENOUS

## 2018-04-07 MED ORDER — DOPAMINE-DEXTROSE 3.2-5 MG/ML-% IV SOLN
10.0000 ug/kg/min | INTRAVENOUS | Status: DC
  Administered 2018-04-07 (×2): 10 ug/kg/min via INTRAVENOUS

## 2018-04-07 MED ORDER — ACETAMINOPHEN 325 MG PO TABS
650.0000 mg | ORAL_TABLET | ORAL | Status: DC | PRN
  Administered 2018-04-07 – 2018-04-12 (×3): 650 mg via ORAL
  Filled 2018-04-07 (×3): qty 2

## 2018-04-07 MED ORDER — ONDANSETRON HCL 4 MG/2ML IJ SOLN
4.0000 mg | Freq: Four times a day (QID) | INTRAMUSCULAR | Status: DC | PRN
  Administered 2018-04-07: 4 mg via INTRAVENOUS
  Filled 2018-04-07: qty 2

## 2018-04-07 MED ORDER — PROPOFOL 10 MG/ML IV BOLUS
INTRAVENOUS | Status: DC | PRN
  Administered 2018-04-07: 20 mg via INTRAVENOUS
  Administered 2018-04-07: 60 mg via INTRAVENOUS
  Administered 2018-04-07: 20 mg via INTRAVENOUS

## 2018-04-07 MED ORDER — PNEUMOCOCCAL VAC POLYVALENT 25 MCG/0.5ML IJ INJ
0.5000 mL | INJECTION | INTRAMUSCULAR | Status: AC
  Administered 2018-04-08: 0.5 mL via INTRAMUSCULAR
  Filled 2018-04-07: qty 0.5

## 2018-04-07 MED FILL — Medication: Qty: 1 | Status: AC

## 2018-04-07 NOTE — Anesthesia Postprocedure Evaluation (Signed)
Anesthesia Post Note  Patient: Igor Bishop Flippen  Procedure(s) Performed: CARDIOVERSION (N/A )     Patient location during evaluation: PACU Anesthesia Type: General Level of consciousness: sedated and patient cooperative Pain management: pain level controlled Vital Signs Assessment: post-procedure vital signs reviewed and stable Respiratory status: spontaneous breathing Cardiovascular status: stable Anesthetic complications: no Comments: Pt profoundly hypotensive post cardioversion. Presumed very slow circulation time as induction took uncharacteristically long. Required significant vasopressor support and BP consistently remained low (80s/50s). At some point RNs attempted to sit him up and he became profoundly bradycardic and hypotensive. Nearly code situation. Was given atropine and epinephrine (268mcg). BP rebounded and he quickly regained consciousness. He did vomit bilious material, but did not appear to aspirate. BP response to epi was appropriate, but continued to drop back to the 80s/50s. Dopamine 27mcg/kg/min started and pt transferred to ICU.     Last Vitals:  Vitals:   04/07/18 1418 04/07/18 1430  BP: (!) 88/70 (!) 82/53  Pulse: 72 70  Resp: 19 19  Temp:    SpO2: 96% 97%    Last Pain:  Vitals:   04/07/18 1430  TempSrc:   PainSc: [place]

## 2018-04-07 NOTE — Progress Notes (Signed)
Pt. In recovery following cardioversion, where his BPs stayed around 70-92 systolic. Croitoru MD aware of patient pressure with verbal order to attempt orthostatic vitals after 1.5 L NS. Pt A&O X4, agreed to attempt to sit up on the side of the bed around 1430, this RN noticed patient appeared dizzy, and immediatly laid back in bed when his BP dropped. Croitoru MD called and nearby CRNA and charge RN came in room to assist with pt hypotension. At 1443 pt became bradycardic 30-40s and briefly unresponsive. Pt vomited and again started talking to staff. 200 of EPi and .5 of atropine and another 500 mL NS given by CRNA and anesthesiologist. By 9574 pt BP had increased and heart rate had increased. Pt alert and oriented and transferred to [place] [facility] for ICU care.

## 2018-04-07 NOTE — Anesthesia Preprocedure Evaluation (Signed)
Anesthesia Evaluation  Patient identified by MRN, date of birth, ID band Patient awake    Reviewed: Allergy & Precautions, NPO status , Patient's Chart, lab work & pertinent test results  Airway Mallampati: II  TM Distance: >3 FB Neck ROM: Full    Dental no notable dental hx.    Pulmonary neg pulmonary ROS, former smoker,    Pulmonary exam normal breath sounds clear to auscultation       Cardiovascular + angina + CAD, + CABG and + Peripheral Vascular Disease  + dysrhythmias Atrial Fibrillation  Rhythm:Irregular Rate:Normal + Systolic murmurs  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/2014 23 mm [facility] Ease bovine pericardial bioprosthetic tissue valve with bovine pericardial patch enlargement of aortic root     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 LIMA to LAD S/P mitral valve repair 11/01/2014 Debridement of vegetation on anterior leaflet   Echo 03/15/2018 IMPRESSIONS    1. Mildly dilated left atrial size.  2. Severely dilated right atrial size.  3. The mitral valve normal in structure. Regurgitation is mild to moderate by color flow Doppler.  4. Normal tricuspid valve.  5. Tricuspid regurgitation moderate.  6. The aortic valve normal. There ismoderate calcification of the aortic valve.  7. The aortic root and ascending aortaare normal is size and structure.  8. The inferior vena cava was dilated in size with <50% respiratory variablity.  9. The interatrial septum was not assessed.    Neuro/Psych  Headaches, PSYCHIATRIC DISORDERS Depression CVA    GI/Hepatic negative GI ROS, (+) Hepatitis -, Unspecified  Endo/Other  negative endocrine ROS  Renal/GU negative Renal ROS     Musculoskeletal negative musculoskeletal ROS (+)   Abdominal   Peds  Hematology  (+) anemia ,   Anesthesia Other Findings   Reproductive/Obstetrics                             Anesthesia  Physical  Anesthesia Plan  ASA: III  Anesthesia Plan: General   Post-op Pain Management:    Induction: Intravenous  PONV Risk Score and Plan:   Airway Management Planned: Mask  Additional Equipment:   Intra-op Plan:   Post-operative Plan:   Informed Consent: I have reviewed the patients History and Physical, chart, labs and discussed the procedure including the risks, benefits and alternatives for the proposed anesthesia with the patient or authorized representative who has indicated his/her understanding and acceptance.     Dental advisory given  Plan Discussed with: CRNA  Anesthesia Plan Comments:         Anesthesia Quick Evaluation

## 2018-04-07 NOTE — Transfer of Care (Signed)
Immediate Anesthesia Transfer of Care Note  Patient: Alan [name]  Procedure(s) Performed: CARDIOVERSION (N/A )  Patient Location: Endoscopy Unit  Anesthesia Type:General  Level of Consciousness: awake, alert  and oriented  Airway & Oxygen Therapy: Patient Spontanous Breathing and Patient connected to nasal cannula oxygen  Post-op Assessment: Report given to RN and Post -op Vital signs reviewed and stable  Post vital signs: Reviewed and stable  Last Vitals:  Vitals Value Taken Time  BP    Temp    Pulse    Resp    SpO2      Last Pain:  Vitals:   04/07/18 1203  TempSrc: Oral  PainSc: 0-No pain         Complications: No apparent anesthesia complications

## 2018-04-07 NOTE — Interval H&P Note (Signed)
History and Physical Interval Note:  04/07/2018 1:04 PM  Alan [name]  has presented today for surgery, with the diagnosis of atrial fibrillation  The various methods of treatment have been discussed with the patient and family. After consideration of risks, benefits and other options for treatment, the patient has consented to  Procedure(s): CARDIOVERSION (N/A) as a surgical intervention .  The patient's history has been reviewed, patient examined, no change in status, stable for surgery.  I have reviewed the patient's chart and labs.  Questions were answered to the patient's satisfaction.     Gal Smolinski

## 2018-04-07 NOTE — Op Note (Addendum)
Procedure: Electrical Cardioversion Indications:  Atrial Flutter  Procedure Details:  Consent: Risks of procedure as well as the alternatives and risks of each were explained to the (patient/caregiver).  Consent for procedure obtained.  Time Out: Verified patient identification, verified procedure, site/side was marked, verified correct patient position, special equipment/implants available, medications/allergies/relevent history reviewed, required imaging and test results available.  Performed  Patient placed on cardiac monitor, pulse oximetry, supplemental oxygen as necessary.  Sedation given: Propofol 100 mg IV, Dr. Lissa Hoard Pacer pads placed anterior and posterior chest.  Cardioverted 1 time(s).  Cardioversion with synchronized biphasic 120J shock.  Evaluation: Findings: Post procedure EKG shows: NSR Complications: None Patient did tolerate procedure well.  Developed hypotension for several minutes after the procedure, requiring periodic doses of IV phenylephrine until propofol effect resolved.  Time Spent Directly with the Patient:  30 minutes   Alan [name] 04/07/2018, 1:11 PM

## 2018-04-07 NOTE — Progress Notes (Signed)
  Echocardiogram 2D Echocardiogram has been performed.  Niomi Valent L Androw 04/07/2018, 4:13 PM

## 2018-04-07 NOTE — Progress Notes (Signed)
Following cardioversion the patient developed severe hypotension that improved with intravenous phenylephrine.  He maintained sinus rhythm.  1.5 L of intravenous saline was administered.  His blood pressure appeared to stabilized around 90/60.  His electrocardiogram showed sinus rhythm with minor nonspecific ST-T-segment changes and rightward axis.  After trying to sit up in bed he developed severe dizziness, profound hypotension and bradycardia (junctional rhythm 40 bpm), followed by nausea and vomiting.  He probably experienced brief syncope.  Intravenous atropine and intravenous epinephrine were administered and he developed sinus tachycardia and overshoot hypertension.  As the epinephrine effect wore off he developed hypotension again down to 87/52 mmHg and intravenous dopamine at 10 mcg/kilogram/minute was started.  Jugular venous pulsations were markedly elevated to the angle of the jaw.   He quickly regained consciousness and is fully alert and oriented.  He denies dyspnea or angina.  His only other complaint during this time has been a persistent mild frontal headache, which was present before the cardioversion.   On telemetry there appeared to be transient severe ST segment depression during the period of severe hypotension, that is gradually improving.  A repeat ECG has not yet been performed.  He will be admitted to the intensive care unit for IV pressors.  A stat echocardiogram is ordered.  Sanda Klein, MD, [facility], [place] [facility] [phone] office [phone] pager 04/07/2018 3:21 PM

## 2018-04-08 ENCOUNTER — Inpatient Hospital Stay (HOSPITAL_COMMUNITY): Payer: Medicare Other

## 2018-04-08 ENCOUNTER — Inpatient Hospital Stay (HOSPITAL_COMMUNITY): Payer: Medicare Other | Admitting: Anesthesiology

## 2018-04-08 ENCOUNTER — Encounter (HOSPITAL_COMMUNITY)
Admission: RE | Disposition: A | Discharge status: Home / Self Care | Payer: Self-pay | Source: Home / Self Care | Attending: Cardiovascular Disease

## 2018-04-08 ENCOUNTER — Other Ambulatory Visit: Payer: Self-pay

## 2018-04-08 ENCOUNTER — Encounter (HOSPITAL_COMMUNITY): Payer: Self-pay | Admitting: Cardiovascular Disease

## 2018-04-08 ENCOUNTER — Encounter: Payer: Self-pay | Admitting: *Deleted

## 2018-04-08 DIAGNOSIS — I9581 Postprocedural hypotension: Secondary | ICD-10-CM

## 2018-04-08 DIAGNOSIS — R001 Bradycardia, unspecified: Secondary | ICD-10-CM

## 2018-04-08 DIAGNOSIS — I340 Nonrheumatic mitral (valve) insufficiency: Secondary | ICD-10-CM

## 2018-04-08 HISTORY — PX: TEE WITHOUT CARDIOVERSION: SHX5443

## 2018-04-08 LAB — BASIC METABOLIC PANEL
Anion gap: 12 (ref 5–15)
BUN: 20 mg/dL (ref 8–23)
CHLORIDE: 107 mmol/L (ref 98–111)
CO2: 19 mmol/L — ABNORMAL LOW (ref 22–32)
Calcium: 8.8 mg/dL — ABNORMAL LOW (ref 8.9–10.3)
Creatinine, Ser: 1.44 mg/dL — ABNORMAL HIGH (ref 0.61–1.24)
GFR calc Af Amer: 57 mL/min — ABNORMAL LOW (ref >60)
GFR calc non Af Amer: 49 mL/min — ABNORMAL LOW (ref >60)
Glucose, Bld: 91 mg/dL (ref 70–99)
Potassium: 4.5 mmol/L (ref 3.5–5.1)
Sodium: 138 mmol/L (ref 135–145)

## 2018-04-08 LAB — TROPONIN I: Troponin I: <0.03 ng/mL (ref <0.03)

## 2018-04-08 LAB — HIV ANTIBODY (ROUTINE TESTING W REFLEX): HIV Screen 4th Generation wRfx: NONREACTIVE

## 2018-04-08 SURGERY — ECHOCARDIOGRAM, TRANSESOPHAGEAL
Anesthesia: Monitor Anesthesia Care

## 2018-04-08 MED ORDER — PROPOFOL 500 MG/50ML IV EMUL: INTRAVENOUS | Status: DC | PRN

## 2018-04-08 MED ORDER — PROPOFOL 500 MG/50ML IV EMUL
INTRAVENOUS | Status: DC | PRN
  Administered 2018-04-08: 75 ug/kg/min via INTRAVENOUS

## 2018-04-08 MED ORDER — PROPOFOL 10 MG/ML IV BOLUS: INTRAVENOUS | Status: DC | PRN

## 2018-04-08 MED ORDER — LACTATED RINGERS IV SOLN
INTRAVENOUS | Status: DC | PRN
  Administered 2018-04-08: 12:00:00 via INTRAVENOUS

## 2018-04-08 MED ORDER — BUTAMBEN-TETRACAINE-BENZOCAINE 2-2-14 % EX AERO
INHALATION_SPRAY | CUTANEOUS | Status: DC | PRN
  Administered 2018-04-08: 2 via TOPICAL

## 2018-04-08 MED ORDER — SODIUM CHLORIDE 0.9 % IV SOLN
INTRAVENOUS | Status: DC | PRN
  Administered 2018-04-08: 30 ug/min via INTRAVENOUS

## 2018-04-08 MED ORDER — PROPOFOL 10 MG/ML IV BOLUS
INTRAVENOUS | Status: DC | PRN
  Administered 2018-04-08: 20 mg via INTRAVENOUS

## 2018-04-08 NOTE — Telephone Encounter (Signed)
-----   Message from Sanda Klein, MD sent at 04/07/2018  1:44 PM EST ----- Could you please schedule him for an echo between now and his f/u appt in March. Re: atrial flutter, MV repair, s/p AVR, tricuspid regurgitation Thanks MCr

## 2018-04-08 NOTE — Anesthesia Procedure Notes (Signed)
Procedure Name: MAC Date/Time: 04/08/2018 1:18 PM Performed by: Mariea Clonts, CRNA Pre-anesthesia Checklist: Patient identified, Emergency Drugs available, Suction available, Patient being monitored and Timeout performed Patient Re-evaluated:Patient Re-evaluated prior to induction Oxygen Delivery Method: Nasal cannula

## 2018-04-08 NOTE — Progress Notes (Signed)
Progress Note  Patient Name: Alan [name] Date of Encounter: 04/08/2018  Primary Cardiologist: Sanda Klein, MD   Subjective   Feeling well.  No lightheadedness or dizziness  Inpatient Medications    Scheduled Meds: . pneumococcal 23 valent vaccine  0.5 mL Intramuscular Tomorrow-1000  . rivaroxaban  20 mg Oral Q supper   Continuous Infusions: . sodium chloride 20 mL/hr (04/07/18 1840)  . DOPamine 10 mcg/kg/min (04/07/18 1530)   PRN Meds: acetaminophen, ondansetron (ZOFRAN) IV   Vital Signs    Vitals:   04/08/18 0500 04/08/18 0700 04/08/18 0800 04/08/18 0810  BP:  108/78 97/75   Pulse:  73 68   Resp:  (!) 23 16   Temp:    97.9 F (36.6 C)  TempSrc:    Oral  SpO2:  96% 94%   Weight: 81.3 kg     Height:        Intake/Output Summary (Last 24 hours) at 04/08/2018 1001 Last data filed at 04/08/2018 0300 Gross per 24 hour  Intake 1199.04 ml  Output -  Net 1199.04 ml   Last 3 Weights 04/08/2018 04/07/2018 03/17/2018  Weight (lbs) 179 lb 3.7 oz 173 lb 173 lb  Weight (kg) 81.3 kg 78.472 kg 78.472 kg      Telemetry    Sinus rhythm.  PACs, SVT up to 6.9 seconds.  4 beats NSVT  - Personally Reviewed  ECG    Sinus rhythm.  Rate 69 bpm.  LA enlargement.  Prior septal infarct.   - Personally Reviewed  Physical Exam   VS:  BP 97/75   Pulse 68   Temp 97.9 F (36.6 C) (Oral)   Resp 16   Ht 6' (1.829 m)   Wt 81.3 kg   SpO2 94%   BMI 24.31 kg/m  , BMI Body mass index is 24.31 kg/m. GENERAL:  Well appearing HEENT: Pupils equal round and reactive, fundi not visualized, oral mucosa unremarkable NECK:  No jugular venous distention, waveform within normal limits, carotid upstroke brisk and symmetric, no bruits, no thyromegaly LYMPHATICS:  No cervical adenopathy LUNGS:  Clear to auscultation bilaterally HEART:  RRR.  PMI not displaced or sustained,S1 and S2 within normal limits, no S3, no S4, no clicks, no rubs, II/VI systolic murmur ABD:  Flat, positive  bowel sounds normal in frequency in pitch, no bruits, no rebound, no guarding, no midline pulsatile mass, no hepatomegaly, no splenomegaly EXT:  2 plus pulses throughout, no edema, no cyanosis no clubbing SKIN:  No rashes no nodules NEURO:  Cranial nerves II through XII grossly intact, motor grossly intact throughout [facility]:  Cognitively intact, oriented to person place and time   Labs    Chemistry Recent Labs  Lab 04/07/18 1221 04/07/18 1606 04/08/18 0318  NA 140 141 138  K 4.6 4.5 4.5  CL  --  110 107  CO2  --  23 19*  GLUCOSE 89 106* 91  BUN  --  19 20  CREATININE  --  1.49* 1.44*  CALCIUM  --  8.2* 8.8*  PROT  --  5.0*  --   ALBUMIN  --  3.0*  --   AST  --  34  --   ALT  --  26  --   ALKPHOS  --  30*  --   BILITOT  --  1.2  --   GFRNONAA  --  47* 49*  GFRAA  --  55* 57*  ANIONGAP  --  8 12  Hematology Recent Labs  Lab 04/07/18 1221 04/07/18 1606  WBC  --  7.3  RBC  --  4.49  HGB 15.3 13.0  HCT 45.0 41.7  MCV  --  92.9  MCH  --  29.0  MCHC  --  31.2  RDW  --  13.7  PLT  --  154    Cardiac Enzymes Recent Labs  Lab 04/07/18 1606 04/07/18 2209 04/08/18 0318  TROPONINI <0.03 0.03* <0.03   No results for input(s): TROPIPOC in the last 168 hours.   BNPNo results for input(s): BNP, PROBNP in the last 168 hours.   DDimer No results for input(s): DDIMER in the last 168 hours.   Radiology    No results found.  Cardiac Studies   Echo 04/07/18: IMPRESSIONS    1. The left ventricle has hyperdynamic systolic function of >16%. The cavity size was normal. Left ventricular diastolic Doppler parameters are consistent with restrictive filling Elevated mean left atrial pressure There is right ventricular  volume/pressure overload. No evidence of left ventricular regional wall motion abnormalities.  2. The right ventricle has normal systolic function. The cavity was severely enlarged. There is no increase in right ventricular wall thickness. Right  ventricular systolic pressure is mildly elevated with an estimated pressure of 42.8 mmHg.  3. Left atrial size was severely dilated.  4. Right atrial size was severely dilated.  5. There is mild thickening. Mitral valve regurgitation is severe by color flow Doppler. The MR jet is centrally-directed. Mild increase in gradients in the range of mitral valve stenosis, due to severe regurgitation and previous repair.  6. The tricuspid valve is normal in structure. Tricuspid valve regurgitation is moderate-severe.  7. The inferior vena cava was dilated in size with <50% respiratory variability.  8. There is marked worsening of the mitral insufficiency compared to 2016. Recommend TEE for further evaluation.  9. Right atrial pressure is estimated at 15 mmHg.   Patient Profile     68 y.o. 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    Assessment & Plan    # s/p mitral valve repair: # Severe mitral regurgitation: Alan [name] developed symptomatic hypotension and bradycardia after DCCV.  Stat echo revealed severe mitral regurgitation.  Inflow gradient was also moderately elevated.  We will plan for TEE with anesthesia to better evaluate.   # Syncope: 2/2 hypotension and bradycardia post procedure.  This was likely 2/2 anesthesia, bradycardia and severe MR as above.  He is now feeling well, though he remains hypotensive.  He temporarily required dopamine but has been off since yesterday.  # Persistent atrial fibrillation: Maintaining sinus rhythm with bursts of SVT.  Home metoprolol is on hold.  Continue Xarelto.  For questions or updates, please contact Violet Please consult [email or web address]  for contact info under        Signed, Skeet Latch, MD  04/08/2018, 10:01 AM

## 2018-04-08 NOTE — Plan of Care (Signed)
  Problem: Elimination: Goal: Will not experience complications related to urinary retention Outcome: Completed/Met   Problem: Pain Managment: Goal: General experience of comfort will improve Outcome: Completed/Met   Problem: Safety: Goal: Ability to remain free from injury will improve Outcome: Completed/Met   Problem: Skin Integrity: Goal: Risk for impaired skin integrity will decrease Outcome: Completed/Met

## 2018-04-08 NOTE — Telephone Encounter (Signed)
This encounter was created in error - please disregard.

## 2018-04-08 NOTE — Plan of Care (Signed)

## 2018-04-08 NOTE — Anesthesia Preprocedure Evaluation (Addendum)
Anesthesia Evaluation  Patient identified by MRN, date of birth, ID band Patient awake    Reviewed: Allergy & Precautions, NPO status , Patient's Chart, lab work & pertinent test results  Airway Mallampati: II  TM Distance: >3 FB Neck ROM: Full    Dental no notable dental hx. (+) Teeth Intact, Dental Advisory Given   Pulmonary asthma , former smoker,    Pulmonary exam normal breath sounds clear to auscultation       Cardiovascular hypertension, Pt. on medications and Pt. on home beta blockers + angina + CAD and + Peripheral Vascular Disease  Normal cardiovascular exam+ dysrhythmias Atrial Fibrillation  Rhythm:Regular Rate:Normal  S/p CABG, AVR and MV repair 2016  TTE 04/08/18 EF 65%, RV severely enlarged, severe MR, mod/severe TR, marked worsening of the mitral insufficiency compared to 2016   Neuro/Psych PSYCHIATRIC DISORDERS Depression CVA (2016)    GI/Hepatic negative GI ROS, (+) Hepatitis -, Unspecified  Endo/Other  negative endocrine ROS  Renal/GU negative Renal ROS  negative genitourinary   Musculoskeletal negative musculoskeletal ROS (+)   Abdominal   Peds  Hematology negative hematology ROS (+)   Anesthesia Other Findings CV for afib yesterday. Developed hypotension and bradycardia  afterwards requiring 225mg epi, 0.5 atropine, and dopamine infusion. Urgent TTE showed severe MR. Now off pressors. Presented for TEE for further evaluation of MR.     Reproductive/Obstetrics                          Anesthesia Physical Anesthesia Plan  ASA: III  Anesthesia Plan: MAC   Post-op Pain Management:    Induction: Intravenous  PONV Risk Score and Plan: 1 and Propofol infusion and Treatment may vary due to age or medical condition  Airway Management Planned: Natural Airway  Additional Equipment:   Intra-op Plan:   Post-operative Plan:   Informed Consent: I have reviewed the  patients History and Physical, chart, labs and discussed the procedure including the risks, benefits and alternatives for the proposed anesthesia with the patient or authorized representative who has indicated his/her understanding and acceptance.     Dental advisory given  Plan Discussed with: CRNA  Anesthesia Plan Comments:         Anesthesia Quick Evaluation

## 2018-04-08 NOTE — Discharge Instructions (Addendum)
Post Cardiac Catheterization: NO HEAVY LIFTING OR SEXUAL ACTIVITY X 7 DAYS. NO DRIVING X 3-5 DAYS. NO SOAKING BATHS, HOT TUBS, POOLS, ETC., X 7 DAYS.  Radial Site Care Refer to this sheet in the next few weeks. These instructions provide you with information on caring for yourself after your procedure. Your caregiver may also give you more specific instructions. Your treatment has been planned according to current medical practices, but problems sometimes occur. Call your caregiver if you have any problems or questions after your procedure. HOME CARE INSTRUCTIONS  You may shower the day after the procedure.Remove the bandage (dressing) and gently wash the site with plain soap and water.Gently pat the site dry.   Do not apply powder or lotion to the site.   Do not submerge the affected site in water for 3 to 5 days.   Inspect the site at least twice daily.   Do not flex or bend the affected arm for 24 hours.   No lifting over 5 pounds (2.3 kg) for 5 days after your procedure.   Do not drive home if you are discharged the same day of the procedure. Have someone else drive you.  What to expect:  Any bruising will usually fade within 1 to 2 weeks.   Blood that collects in the tissue (hematoma) may be painful to the touch. It should usually decrease in size and tenderness within 1 to 2 weeks.  SEEK IMMEDIATE MEDICAL CARE IF:  You have unusual pain at the radial site.   You have redness, warmth, swelling, or pain at the radial site.   You have drainage (other than a small amount of blood on the dressing).   You have chills.   You have a fever or persistent symptoms for more than 72 hours.   You have a fever and your symptoms suddenly get worse.   Your arm becomes pale, cool, tingly, or numb.   You have heavy bleeding from the site. Hold pressure on the site.    Electrical Cardioversion, Care After This sheet gives you information about how to care for yourself after your  procedure. Your health care provider may also give you more specific instructions. If you have problems or questions, contact your health care provider. What can I expect after the procedure? After the procedure, it is common to have:  Some redness on the skin where the shocks were given. Follow these instructions at home:   Do not drive for 24 hours if you were given a medicine to help you relax (sedative).  Take over-the-counter and prescription medicines only as told by your health care provider.  Ask your health care provider how to check your pulse. Check it often.  Rest for 48 hours after the procedure or as told by your health care provider.  Avoid or limit your caffeine use as told by your health care provider. Contact a health care provider if:  You feel like your heart is beating too quickly or your pulse is not regular.  You have a serious muscle cramp that does not go away. Get help right away if:   You have discomfort in your chest.  You are dizzy or you feel faint.  You have trouble breathing or you are short of breath.  Your speech is slurred.  You have trouble moving an arm or leg on one side of your body.  Your fingers or toes turn cold or blue. This information is not intended to replace advice given to  you by your health care provider. Make sure you discuss any questions you have with your health care provider. Document Released: 12/01/2012 Document Revised: 09/14/2015 Document Reviewed: 08/17/2015 Elsevier Interactive Patient Education  2019 [employer].  ----------------------- Information on my medicine - XARELTO (Rivaroxaban)  Why was Xarelto prescribed for you? Xarelto was prescribed for you to reduce the risk of a blood clot forming that can cause a stroke if you have a medical condition called atrial fibrillation (a type of irregular heartbeat).  What do you need to know about xarelto ? Take your Xarelto ONCE DAILY at the same time every  day with your evening meal. If you have difficulty swallowing the tablet whole, you may crush it and mix in applesauce just prior to taking your dose.  Take Xarelto exactly as prescribed by your doctor and DO NOT stop taking Xarelto without talking to the doctor who prescribed the medication.  Stopping without other stroke prevention medication to take the place of Xarelto may increase your risk of developing a clot that causes a stroke.  Refill your prescription before you run out.  After discharge, you should have regular check-up appointments with your healthcare provider that is prescribing your Xarelto.  In the future your dose may need to be changed if your kidney function or weight changes by a significant amount.  What do you do if you miss a dose? If you are taking Xarelto ONCE DAILY and you miss a dose, take it as soon as you remember on the same day then continue your regularly scheduled once daily regimen the next day. Do not take two doses of Xarelto at the same time or on the same day.   Important Safety Information A possible side effect of Xarelto is bleeding. You should call your healthcare provider right away if you experience any of the following: ? Bleeding from an injury or your nose that does not stop. ? Unusual colored urine (red or dark brown) or unusual colored stools (red or black). ? Unusual bruising for unknown reasons. ? A serious fall or if you hit your head (even if there is no bleeding).  Some medicines may interact with Xarelto and might increase your risk of bleeding while on Xarelto. To help avoid this, consult your healthcare provider or pharmacist prior to using any new prescription or non-prescription medications, including herbals, vitamins, non-steroidal anti-inflammatory drugs (NSAIDs) and supplements.  This website has more information on Xarelto: [email or web address]

## 2018-04-08 NOTE — Transfer of Care (Signed)
Immediate Anesthesia Transfer of Care Note  Patient: Alan [name]  Procedure(s) Performed: TRANSESOPHAGEAL ECHOCARDIOGRAM (TEE) (N/A )  Patient Location: Endoscopy Unit  Anesthesia Type:MAC  Level of Consciousness: awake, alert  and oriented  Airway & Oxygen Therapy: Patient Spontanous Breathing and Patient connected to nasal cannula oxygen  Post-op Assessment: Report given to RN, Post -op Vital signs reviewed and stable and Patient moving all extremities X 4  Post vital signs: Reviewed and stable  Last Vitals:  Vitals Value Taken Time  BP    Temp    Pulse    Resp    SpO2      Last Pain:  Vitals:   04/08/18 1206  TempSrc: Oral  PainSc: 0-No pain         Complications: No apparent anesthesia complications

## 2018-04-09 ENCOUNTER — Encounter (HOSPITAL_COMMUNITY): Payer: Self-pay | Admitting: Physician Assistant

## 2018-04-09 DIAGNOSIS — I2510 Atherosclerotic heart disease of native coronary artery without angina pectoris: Secondary | ICD-10-CM

## 2018-04-09 MED ORDER — FUROSEMIDE 10 MG/ML IJ SOLN
40.0000 mg | Freq: Two times a day (BID) | INTRAMUSCULAR | Status: DC
  Administered 2018-04-09 – 2018-04-10 (×3): 40 mg via INTRAVENOUS
  Filled 2018-04-09 (×3): qty 4

## 2018-04-09 MED ORDER — HEPARIN (PORCINE) 25000 UT/250ML-% IV SOLN
1200.0000 [IU]/h | INTRAVENOUS | Status: DC
  Administered 2018-04-09: 1100 [IU]/h via INTRAVENOUS
  Administered 2018-04-10 – 2018-04-12 (×3): 1200 [IU]/h via INTRAVENOUS
  Filled 2018-04-09 (×4): qty 250

## 2018-04-09 NOTE — Consult Note (Signed)
[facility] TEAM  Inpatient TAVR Consultation:   Patient ID: Alan [name]; [id number]; 26-Sep-1950   Admit date: 04/07/2018 Date of Consult: 04/09/2018  Primary Care Provider: Camille Bal, PA-C Primary Cardiologist: Dr. Sallyanne Kuster  Patient Profile:   Alan [name] is a 68 y.o. male with a hx of prostate cancer, enterococcal endocarditis s/p bioprosthetic AVR, mitral valve debridement, patch enlargement of aortic root, LAA clipping and CABGx 1 (LIMA--> LAD) in 09/2014, and recurrent afib/flutter who is being seen today for the evaluation of severe mitral regurgiation at the request of Dr. Oval Linsey.  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 Cardiac catheterization was performed on 10/23/2014 and revealed 60-70% LAD stenosis, 70% OM2 and 90% PDA. The patient additionally underwent consultation with dental Medicine and GI medicine prior to surgery. Thorough dental exam, upper and lower endoscopy failed to identify any pathologic source of enterococcus.  Ultimately, Dr. Roxy Manns performed aortic valve replacement with a biological prosthesis (23 mm MagnaEase), patch enlargement of the aortic root (Nicks' technique) and mitral valve repair without annuloplasty ring as well as single-vessel LIMA to LAD bypass for incidentally discovered coronary artery disease and clipping of the left atrial appendage (40 mm Atricure clip). He had post op atrial fibrillation treated with amiodarone and coumadin. Amiodarone was later discontinued 2/2 elevated LFTs. He was discharged on 6 weeks of IV abx.  He otherwise did quite well and routine follow-up echocardiogram performed 3 months  postoperatively revealed normal left ventricular systolic function, normal functioning bioprosthetic tissue valve in the aortic position, and mild mitral regurgitation.  Mean transvalvular gradient across the mitral valve was measured 5 mmHg at that time.  Patient has done well from a cardiac standpoint and maintained sinus rhythm until recently.  He states that approximately 3 months ago he began to experience significant symptoms of exertional shortness of breath and fatigue.  Symptoms progressed and he was recently seen in follow-up by Dr. Sallyanne Kuster at which time he was found to be in atrial flutter.  Echocardiogram performed March 26, 2018 revealed normal left ventricular function with normal functioning bioprosthetic tissue valve in the aortic position and what was reported to be mild to moderate mitral regurgitation.  Symptoms rapidly progressed over the last 2 weeks, during which time the patient developed increased shortness of breath, orthopnea, and atypical chest discomfort.  He was brought in for elective TEE and cardioversion yesterday.  TEE revealed severe mitral regurgitation.  The patient underwent cardioversion back in sinus rhythm but he developed severe hypotension following the procedure.  He was resuscitated with 1.5 L of saline.  There was reportedly severe ST segment depression noted on telemetry during his period of hypotension.  Subsequent troponin levels have remained negative.  The structural heart team was consulted for input on need for mitral valve repair, replacement or potential percutaneous clipping. He is currently feeling much better back in sinus rhythm and after diuresis.      Past Medical History:  Diagnosis Date  . 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 Coronary artery disease involving native coronary artery without angina pectoris 10/23/2014  . Endocarditis of mitral valve - Enterococcus  10/19/2014  . Enterococcal bacteremia  10/19/2014  . Hepatitis   . Memory difficulty 02/06/2017  . Migraine 09/12/2015  . 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 Paroxysmal atrial fibrillation ([place]) 10/19/2014  . Paroxysmal atrial flutter ([place]) 10/19/2014  . Prostate cancer ([place])   . Protein-calorie malnutrition, severe ([place])   . Restless leg syndrome   . 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/2014   23 mm [facility] Ease bovine pericardial bioprosthetic tissue valve with bovine pericardial patch enlargement of aortic root  . 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 S/P mitral valve repair 11/01/2014   Debridement of vegetation on anterior leaflet  . Septic embolism ([place]) 10/20/2014   brain and spleen, subclinical  . 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 AORTIC ROOT ENLARGEMENT N/A 11/01/2014   Procedure: AORTIC ROOT ENLARGEMENT;  Surgeon: Rexene Alberts, MD;  Location: [place];  Service: Open Heart Surgery;  Laterality: N/A;  . AORTIC VALVE REPLACEMENT N/A 11/01/2014   Procedure: AORTIC VALVE REPLACEMENT (AVR);  Surgeon: Rexene Alberts, MD;  Location: [place];  Service: Open Heart Surgery;  Laterality: N/A;  . CARDIAC CATHETERIZATION N/A 10/23/2014   Procedure: Right/Left Heart Cath and Coronary Angiography;  Surgeon: Lorretta Harp, MD;  Location: [place] CV LAB;  Service: Cardiovascular;  Laterality: N/A;  . CARDIOVERSION N/A 12/18/2014   Procedure: CARDIOVERSION;  Surgeon: Pixie Casino, MD;  Location[facility] ENDOSCOPY;  Service: Cardiovascular;  Laterality: N/A;  . CARDIOVERSION N/A 02/06/2015   Procedure: CARDIOVERSION;  Surgeon: Larey Dresser, MD;  Location: [place];  Service: Cardiovascular;  Laterality: N/A;  . CARDIOVERSION N/A 04/07/2018   Procedure: CARDIOVERSION;  Surgeon: Sanda Klein, MD;  Location: [facility] ENDOSCOPY;  Service: Cardiovascular;  Laterality: N/A;  . COLONOSCOPY N/A 10/25/2014   Procedure: COLONOSCOPY;  Surgeon: Arta Silence, MD;  Location: [facility] ENDOSCOPY;  Service: Endoscopy;  Laterality: N/A;  . CORONARY ARTERY BYPASS GRAFT N/A 11/01/2014   Procedure: CORONARY ARTERY BYPASS GRAFTING (CABG) x 1 using internal mammary artery;  Surgeon: Rexene Alberts, MD;  Location: [place];  Service: Open Heart Surgery;  Laterality: N/A;  . MITRAL VALVE REPAIR N/A 11/01/2014   Procedure: MITRAL VALVE REPAIR with no Ring;  Surgeon: Rexene Alberts, MD;  Location: [place];  Service: Open Heart Surgery;  Laterality: N/A;  . PROSTATECTOMY    . TEE WITHOUT CARDIOVERSION N/A 10/20/2014   Procedure: TRANSESOPHAGEAL ECHOCARDIOGRAM (TEE);  Surgeon: Josue Hector, MD;  Location: [place];  Service: Cardiovascular;  Laterality: N/A;  . TEE WITHOUT CARDIOVERSION N/A 11/01/2014   Procedure: TRANSESOPHAGEAL ECHOCARDIOGRAM (TEE);  Surgeon: Rexene Alberts, MD;  Location: [place];  Service: Open Heart Surgery;  Laterality: N/A;  . TEE WITHOUT CARDIOVERSION N/A 04/08/2018   Procedure: TRANSESOPHAGEAL ECHOCARDIOGRAM (TEE);  Surgeon: Jerline Pain, MD;  Location: [facility] ENDOSCOPY;  Service: Cardiovascular;  Laterality: N/A;     Inpatient Medications: Scheduled Meds: . rivaroxaban  20 mg Oral Q supper   Continuous Infusions: . DOPamine 10 mcg/kg/min (04/07/18 1530)   PRN Meds: acetaminophen, ondansetron (ZOFRAN) IV  Allergies:    Allergies  Allergen Reactions  . Pollen Extract Other (See Comments)    sneezing    Social History:   Social History   Socioeconomic History  . Marital status: Married    Spouse name: Not on file  .  Number of children: 1  . Years of education: Master's  . Highest education level: Not on file  Occupational History  . Occupation: Economist: [employer]  . Financial resource strain: Not on file  . Food insecurity:    Worry: Not on file     Inability: Not on file  . Transportation needs:    Medical: Not on file    Non-medical: Not on file  Tobacco Use  . Smoking status: Former Research scientist (life sciences)  . Smokeless tobacco: Never Used  . Tobacco comment: Smoked a pipe age 23->28  Substance and Sexual Activity  . Alcohol use: No    Alcohol/week: 0.0 standard drinks    Comment: Drank from age 23->38  . Drug use: No  . Sexual activity: Not on file  Lifestyle  . Physical activity:    Days per week: Not on file    Minutes per session: Not on file  . Stress: Not on file  Relationships  . Social connections:    Talks on phone: Not on file    Gets together: Not on file    Attends religious service: Not on file    Active member of club or organization: Not on file    Attends meetings of clubs or organizations: Not on file    Relationship status: Not on file  . Intimate partner violence:    Fear of current or ex partner: Not on file    Emotionally abused: Not on file    Physically abused: Not on file    Forced sexual activity: Not on file  Other Topics Concern  . Not on file  Social History Narrative   Lives at home w/ his wife   Right-handed   Caffeine: 2 cups daily    Family History:   The patient's family history includes Stroke in his father.  ROS:  Please see the history of present illness.  ROS  All other ROS reviewed and negative.     Physical Exam/Data:   Vitals:   04/08/18 2200 04/08/18 2300 04/09/18 0010 04/09/18 0535  BP: 114/80 123/83 (!) 126/91 102/73  Pulse: 70 76 75 73  Resp: 20 (!) 29 (!) 23 17  Temp:   98.2 F (36.8 C) 97.9 F (36.6 C)  TempSrc:   Oral Oral  SpO2: 94% 92% 94% 94%  Weight:   81.2 kg   Height:        Intake/Output Summary (Last 24 hours) at 04/09/2018 1028 Last data filed at 04/09/2018 0011 Gross per 24 hour  Intake 660 ml  Output -  Net 660 ml   Filed Weights   04/07/18 1203 04/08/18 0500 04/09/18 0010  Weight: 78.5 kg 81.3 kg 81.2 kg   Body mass index is 24.28 kg/m.    General:  Well nourished, well developed, in no acute distress HEENT: normal Lymph: no adenopathy Neck: no JVD Endocrine:  No thryomegaly Vascular: + JVD Cardiac:  normal S1, S2; RRR; 2/6 systolic murmur at LUSB. 2/6 holosystolic murmur at the apex. Lungs:  clear to auscultation bilaterally, no wheezing, rhonchi or rales  Abd: soft, nontender, no hepatomegaly  Ext: no edema Musculoskeletal:  No deformities, BUE and BLE strength normal and equal Skin: warm and dry  Neuro:  CNs 2-12 intact, no focal abnormalities noted Psych:  Normal affect   EKG:  The EKG was personally reviewed and demonstrates:  Sinus, LAE, RAD, septal Q waves Telemetry:  Telemetry was personally reviewed and demonstrates:  Maintaining  sinus    Relevant CV Studies: 11/01/2014 CARDIOTHORACIC SURGERY OPERATIVE NOTE  Date of Procedure:                11/01/2014  Preoperative Diagnosis:       ? Bacterial Endocarditis ? Severe Aortic Insufficiency ? Severe Multi-vessel Coronary Artery Disease ? Mild Mitral Regurgitation ? Paroxysmal Atrial Fibrillation  Postoperative Diagnosis:    Same  Procedure:        Aortic Valve Replacement with Aortic Root Enlargement             [facility] Ease Pericardial Tissue Valve (size 73mm, model # 3300TFX, serial # [id number])             Bovine Pericardial Patch Enlargement of Aortic Root (Nicks' technique)   Mitral Valve Repair without Annuloplasty             Debridement of vegetations adherent to anterior leaflet   Coronary Artery Bypass Grafting x 1             Left Internal Mammary Artery to Distal Left Anterior Descending Coronary Artery   Clipping of Left Atrial Appendage             Atricure left atrial clip (size 40 mm)  _____________  Echo 03/26/18 IMPRESSIONS  1. Mildly dilated left atrial size.  2. Severely dilated right atrial size.  3. The mitral valve normal in structure. Regurgitation is mild to moderate by color flow Doppler.  4. Normal  tricuspid valve.  5. Tricuspid regurgitation moderate.  6. The aortic valve normal. There ismoderate calcification of the aortic valve.  7. The aortic root and ascending aortaare normal is size and structure.  8. The inferior vena cava was dilated in size with <50% respiratory variablity.  9. The interatrial septum was not assessed.  _____________  Echo 04/07/18 IMPRESSION  1. The left ventricle has hyperdynamic systolic function of >06%. The cavity size was normal. Left ventricular diastolic Doppler parameters are consistent with restrictive filling Elevated mean left atrial pressure There is right ventricular  volume/pressure overload. No evidence of left ventricular regional wall motion abnormalities.  2. The right ventricle has normal systolic function. The cavity was severely enlarged. There is no increase in right ventricular wall thickness. Right ventricular systolic pressure is mildly elevated with an estimated pressure of 42.8 mmHg.  3. Left atrial size was severely dilated.  4. Right atrial size was severely dilated.  5. There is mild thickening. Mitral valve regurgitation is severe by color flow Doppler. The MR jet is centrally-directed. Mild increase in gradients in the range of mitral valve stenosis, due to severe regurgitation and previous repair.  6. The tricuspid valve is normal in structure. Tricuspid valve regurgitation is moderate-severe.  7. The inferior vena cava was dilated in size with <50% respiratory variability.  8. There is marked worsening of the mitral insufficiency compared to 2016. Recommend TEE for further evaluation.  9. Right atrial pressure is estimated at 15 mmHg.  _____________  TEE 04/08/18 IMPRESSIONS  1. The left ventricle has has normal systolic function, with an ejection fraction of 60-65%. The cavity size was normal.  2. The right ventricle has mildly reduced systolic function. The cavity was mildly enlarged. There is no increase in right  ventricular wall thickness.  3. Left atrial size was moderately dilated.  4. Right atrial size was severely dilated.  5. The mitral valve is myxomatous. There is moderate thickening of the anterior leaflet. Mitral valve regurgitation is severe by  color flow Doppler. PISA radius 0.8cm.  6. There is tethering of the inferior leaflet and malcoaptation.  7. The tricuspid valve was normal in structure.  8. The pulmonic valve was normal in structure.   Laboratory Data:  Chemistry Recent Labs  Lab 04/07/18 1221 04/07/18 1606 04/08/18 0318  NA 140 141 138  K 4.6 4.5 4.5  CL  --  110 107  CO2  --  23 19*  GLUCOSE 89 106* 91  BUN  --  19 20  CREATININE  --  1.49* 1.44*  CALCIUM  --  8.2* 8.8*  GFRNONAA  --  47* 49*  GFRAA  --  55* 57*  ANIONGAP  --  8 12    Recent Labs  Lab 04/07/18 1606  PROT 5.0*  ALBUMIN 3.0*  AST 34  ALT 26  ALKPHOS 30*  BILITOT 1.2   Hematology Recent Labs  Lab 04/07/18 1221 04/07/18 1606  WBC  --  7.3  RBC  --  4.49  HGB 15.3 13.0  HCT 45.0 41.7  MCV  --  92.9  MCH  --  29.0  MCHC  --  31.2  RDW  --  13.7  PLT  --  154   Cardiac Enzymes Recent Labs  Lab 04/07/18 1606 04/07/18 2209 04/08/18 0318  TROPONINI <0.03 0.03* <0.03   No results for input(s): TROPIPOC in the last 168 hours.  BNPNo results for input(s): BNP, PROBNP in the last 168 hours.  DDimer No results for input(s): DDIMER in the last 168 hours.  Radiology/Studies:  No results found.   STS Risk Calculator:  Procedure: Isolated MVR  Risk of Mortality:  2.421%   Renal Failure:  2.519%   Permanent Stroke:  2.419%   Prolonged Ventilation:  11.733%   DSW Infection:  0.107%   Reoperation:  4.364%   Morbidity or Mortality:  17.168%   Short Length of Stay:  32.119%   Long Length of Stay:  8.724%    Procedure: MVR + CAB  Risk of Mortality:  3.988%   Renal Failure:  1.407%   Permanent Stroke:  1.306%   Prolonged Ventilation:  25.851%   DSW Infection:  0.123%     Reoperation:  4.596%   Morbidity or Mortality:  28.873%   Short Length of Stay:  19.211%   Long Length of Stay:  9.802%     Assessment and Plan:   Please see assessment and plan as outlined by Dr. Roxy Manns below.   Signed, Angelena Form, PA-C  04/09/2018 10:28 AM  I have seen and examined the patient and agree with the assessment as outlined above by Angelena Form, PA-C.  Patient is a 68 year old male well-known to me from his previous hospitalization and surgery for bacterial endocarditis complicated by severe aortic insufficiency with congestive heart failure and septic embolization to the brain and spleen in 2016.  At the time he was incidentally found to have multivessel coronary artery disease.  He underwent aortic valve replacement using a 23 mm Edwards Magna Ease stented bioprosthetic tissue valve with bovine pericardial patch enlargement of the aortic root, mitral valve repair without ring annuloplasty, coronary artery bypass grafting using left internal mammary artery to the distal left anterior descending coronary artery, and clipping of the left atrial appendage.  Intraoperative findings were notable for bulky vegetations on the aortic valve with significant leaflet destruction and severe aortic insufficiency but no annular abscess formation.  The mitral valve was functioning normally although there was clearly pre-existing sclerosis  and thickening of the anterior leaflet of the mitral valve.  Valve repair consisted only of debridement of the small vegetations adherent to the atrial surface of the anterior leaflet.  Ring annuloplasty was not performed because the patient did not have significant mitral regurgitation.  The patient's postoperative convalescence was notable for recurrent atrial flutter for which he underwent cardioversion.  He otherwise did quite well and routine follow-up echocardiogram performed 3 months postoperatively revealed normal left ventricular systolic  function, normal functioning bioprosthetic tissue valve in the aortic position, and mild mitral regurgitation.  Mean transvalvular gradient across the mitral valve was measured 5 mmHg at that time.  Patient has done well from a cardiac standpoint and maintained sinus rhythm until recently.  He states that approximately 3 months ago he began to experience significant symptoms of exertional shortness of breath and fatigue.  Symptoms progressed and he was recently seen in follow-up by Dr. Sallyanne Kuster at which time he was found to be in atrial flutter.  Echocardiogram performed March 26, 2018 revealed normal left ventricular function with normal functioning bioprosthetic tissue valve in the aortic position and what was reported to be mild to moderate mitral regurgitation.  Symptoms rapidly progressed over the last 2 weeks, during which time the patient developed increased shortness of breath, orthopnea, and atypical chest discomfort.  He was brought in for elective TEE and cardioversion yesterday.  TEE revealed severe mitral regurgitation.  The patient underwent cardioversion back in sinus rhythm but he developed severe hypotension following the procedure.  He was resuscitated with 1.5 L of saline.  There was reportedly severe ST segment depression noted on telemetry during his period of hypotension.  Subsequent troponin levels have remained negative.  I have personally reviewed the patient's recent transthoracic and transesophageal echocardiograms and compared them with the transesophageal echocardiogram performed at the time of his surgery in 2016.  The patient appears to have normal left ventricular systolic function.  The bioprosthetic tissue valve in the aortic position is functioning normally.  There is no aortic insufficiency.  The leaflet morphology of the mitral valve appears similar on recent echocardiograms in comparison with the TEE performed in 2016.  There remains significant thickening of the anterior  leaflet of the mitral valve with perhaps mild thickening of the subvalvular apparatus.  On the TEE performed yesterday there is much more pronounced tethering of both the anterior and the posterior leaflets with severe malcoaptation and a broad central jet of mitral regurgitation.  The tethering and mitral regurgitation appear more pronounced than the transthoracic echocardiogram performed March 26, 2018.  Currently the patient reports feeling better and he remains in sinus rhythm.  He still has a holosystolic murmur on physical examination.  I believe the patient's mitral regurgitation is mixed etiology.  He clearly has primary mitral valve disease related to fibrosis and thickening of the anterior leaflet and some degree of fibrosis and restriction of the subvalvular apparatus involving both leaflets.  I suspect that there is a significant secondary component which may be related to the patient's atrial flutter and current volume overload causing some degree of annular dilatation and further leaflet restriction.  The possibility of myocardial ischemia cannot be ruled out but seems less likely.  I recommend continued medical therapy in effort to get this patient's volume status back to baseline.  I feel he should undergo diagnostic cardiac catheterization at some point during this hospitalization.  It is conceivable that the mitral regurgitation might improve with optimal medical therapy and maintenance of  sinus rhythm.  However, I remain concerned that he will still have significant residual mitral regurgitation.  Moreover, in the absence of evidence for progressive ischemic disease it seems likely that the patient recently developed atrial flutter because of the mitral regurgitation and associated elevated left atrial pressures.  Assuming this is the case the patient might benefit from mitral valve repair or replacement with or without Maze procedure.    I discussed matters at length with the patient at  the bedside and with his wife over the telephone.  All of their questions have been addressed.  We will continue to follow along while the patient remains in the hospital and assist with plans for long-term management.   I spent in excess of 120 minutes during the conduct of this hospital encounter and >50% of this time involved direct face-to-face encounter with the patient for counseling and/or coordination of their care.    Rexene Alberts, MD 04/09/2018 11:50 AM

## 2018-04-09 NOTE — Anesthesia Postprocedure Evaluation (Signed)
Anesthesia Post Note  Patient: Alan [name]  Procedure(s) Performed: TRANSESOPHAGEAL ECHOCARDIOGRAM (TEE) (N/A )     Patient location during evaluation: Endoscopy Anesthesia Type: MAC Level of consciousness: awake and alert Pain management: pain level controlled Vital Signs Assessment: post-procedure vital signs reviewed and stable Respiratory status: spontaneous breathing, nonlabored ventilation, respiratory function stable and patient connected to nasal cannula oxygen Cardiovascular status: stable and blood pressure returned to baseline Postop Assessment: no apparent nausea or vomiting Anesthetic complications: no    Last Vitals:  Vitals:   04/09/18 0010 04/09/18 0535  BP: (!) 126/91 102/73  Pulse: 75 73  Resp: (!) 23 17  Temp: 36.8 C 36.6 C  SpO2: 94% 94%    Last Pain:  Vitals:   04/09/18 0535  TempSrc: Oral  PainSc: 0-No pain                 Zailyn Rowser L Malley Hauter

## 2018-04-09 NOTE — Progress Notes (Signed)
Progress Note  Patient Name: Alan [name] Date of Encounter: 04/09/2018  Primary Cardiologist: Sanda Klein, MD   Subjective   Feeling well.  No lightheadedness or dizziness. Denies shortness of breath  Inpatient Medications    Scheduled Meds: . rivaroxaban  20 mg Oral Q supper   Continuous Infusions: . DOPamine 10 mcg/kg/min (04/07/18 1530)   PRN Meds: acetaminophen, ondansetron (ZOFRAN) IV   Vital Signs    Vitals:   04/08/18 2200 04/08/18 2300 04/09/18 0010 04/09/18 0535  BP: 114/80 123/83 (!) 126/91 102/73  Pulse: 70 76 75 73  Resp: 20 (!) 29 (!) 23 17  Temp:   98.2 F (36.8 C) 97.9 F (36.6 C)  TempSrc:   Oral Oral  SpO2: 94% 92% 94% 94%  Weight:   81.2 kg   Height:        Intake/Output Summary (Last 24 hours) at 04/09/2018 1124 Last data filed at 04/09/2018 0011 Gross per 24 hour  Intake 660 ml  Output -  Net 660 ml   Last 3 Weights 04/09/2018 04/08/2018 04/07/2018  Weight (lbs) 179 lb 0.2 oz 179 lb 3.7 oz 173 lb  Weight (kg) 81.2 kg 81.3 kg 78.472 kg      Telemetry    Sinus rhythm.  PVCs- Personally Reviewed  ECG    04/07/18: Sinus rhythm.  Rate 69 bpm.  LA enlargement.  Prior septal infarct.   - Personally Reviewed  Physical Exam   VS:  BP 102/73 (BP Location: Right Arm)   Pulse 73   Temp 97.9 F (36.6 C) (Oral)   Resp 17   Ht 6' (1.829 m)   Wt 81.2 kg   SpO2 94%   BMI 24.28 kg/m  , BMI Body mass index is 24.28 kg/m. GENERAL:  Well appearing HEENT: Pupils equal round and reactive, fundi not visualized, oral mucosa unremarkable NECK:  +jugular venous distention, Prominent V wave, carotid upstroke brisk and symmetric, no bruits, no thyromegaly LYMPHATICS:  No cervical adenopathy LUNGS:  Clear to auscultation bilaterally HEART:  RRR.  PMI not displaced or sustained,S1 and S2 within normal limits, no S3, no S4, no clicks, no rubs, II/VI systolic murmur at LUSB.  III/VI holosystolic murmur at the apex. ABD:  Flat, positive bowel  sounds normal in frequency in pitch, no bruits, no rebound, no guarding, no midline pulsatile mass, no hepatomegaly, no splenomegaly EXT:  2 plus pulses throughout, no edema, no cyanosis no clubbing SKIN:  No rashes no nodules NEURO:  Cranial nerves II through XII grossly intact, motor grossly intact throughout [facility]:  Cognitively intact, oriented to person place and time   Labs    Chemistry Recent Labs  Lab 04/07/18 1221 04/07/18 1606 04/08/18 0318  NA 140 141 138  K 4.6 4.5 4.5  CL  --  110 107  CO2  --  23 19*  GLUCOSE 89 106* 91  BUN  --  19 20  CREATININE  --  1.49* 1.44*  CALCIUM  --  8.2* 8.8*  PROT  --  5.0*  --   ALBUMIN  --  3.0*  --   AST  --  34  --   ALT  --  26  --   ALKPHOS  --  30*  --   BILITOT  --  1.2  --   GFRNONAA  --  47* 49*  GFRAA  --  55* 57*  ANIONGAP  --  8 12     Hematology Recent Labs  Lab  04/07/18 1221 04/07/18 1606  WBC  --  7.3  RBC  --  4.49  HGB 15.3 13.0  HCT 45.0 41.7  MCV  --  92.9  MCH  --  29.0  MCHC  --  31.2  RDW  --  13.7  PLT  --  154    Cardiac Enzymes Recent Labs  Lab 04/07/18 1606 04/07/18 2209 04/08/18 0318  TROPONINI <0.03 0.03* <0.03   No results for input(s): TROPIPOC in the last 168 hours.   BNPNo results for input(s): BNP, PROBNP in the last 168 hours.   DDimer No results for input(s): DDIMER in the last 168 hours.   Radiology    No results found.  Cardiac Studies   Echo 04/07/18: IMPRESSIONS    1. The left ventricle has hyperdynamic systolic function of >12%. The cavity size was normal. Left ventricular diastolic Doppler parameters are consistent with restrictive filling Elevated mean left atrial pressure There is right ventricular  volume/pressure overload. No evidence of left ventricular regional wall motion abnormalities.  2. The right ventricle has normal systolic function. The cavity was severely enlarged. There is no increase in right ventricular wall thickness. Right ventricular  systolic pressure is mildly elevated with an estimated pressure of 42.8 mmHg.  3. Left atrial size was severely dilated.  4. Right atrial size was severely dilated.  5. There is mild thickening. Mitral valve regurgitation is severe by color flow Doppler. The MR jet is centrally-directed. Mild increase in gradients in the range of mitral valve stenosis, due to severe regurgitation and previous repair.  6. The tricuspid valve is normal in structure. Tricuspid valve regurgitation is moderate-severe.  7. The inferior vena cava was dilated in size with <50% respiratory variability.  8. There is marked worsening of the mitral insufficiency compared to 2016. Recommend TEE for further evaluation.  9. Right atrial pressure is estimated at 15 mmHg.  TEE 04/08/18:  IMPRESSIONS    1. The left ventricle has has normal systolic function, with an ejection fraction of 60-65%. The cavity size was normal.  2. The right ventricle has mildly reduced systolic function. The cavity was mildly enlarged. There is no increase in right ventricular wall thickness.  3. Left atrial size was moderately dilated.  4. Right atrial size was severely dilated.  5. The mitral valve is myxomatous. There is moderate thickening of the anterior leaflet. Mitral valve regurgitation is severe by color flow Doppler. PISA radius 0.8cm.  6. There is tethering of the inferior leaflet and malcoaptation.  7. The tricuspid valve was normal in structure.  8. The pulmonic valve was normal in structure.  Patient Profile     68 y.o. 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    Assessment & Plan    # s/p mitral valve repair: # Severe mitral  regurgitation: Mr. Brisendine developed symptomatic hypotension and bradycardia after DCCV.  Stat echo revealed severe mitral regurgitation.  Inflow gradient was also moderately elevated.  TEE images were personally reviewed and his case was discussed with Dr. Roxy Manns.  It appears that there is tethering of the posterior leaflet.  There is some mild thickening of the anterior leaflet.  He does have severe, centrally located mitral regurgitation.  Structurally, the valve does not actually look  that bad.  There is some question as to whether this could be ischemia mediated.  He is volume overloaded which is also likely contributing.  We will try to diurese him over the weekend and plan for left heart catheterization on Monday.  If this is ischemic MR then we will treat his coronary disease and reassess the mitral valve in several months.  If it is primary MR and there is no significant progression in his coronary disease, he may be a candidate for a minimally invasive mitral valve replacement/repair.  There is concern for increased mitral valve gradients with MitraClip given that his mean gradient was 5 mmHg.  # Syncope: 2/2 hypotension and bradycardia post procedure.  This was likely 2/2 anesthesia, bradycardia and severe MR as above.  He is now feeling well.  He temporarily required dopamine but has been off soon after DCCV.  # Persistent atrial fibrillation: Maintaining sinus rhythm. Home metoprolol is on hold.  Will hold Xarelto and start heparin with plans for cath Monday.  For questions or updates, please contact [place] Please consult [email or web address] for contact info under        Signed, Skeet Latch, MD  04/09/2018, 11:24 AM

## 2018-04-09 NOTE — Progress Notes (Signed)
ANTICOAGULATION CONSULT NOTE - Initial Consult  Pharmacy Consult for Xarelto to Heparin Indication: atrial fibrillation  Allergies  Allergen Reactions  . Pollen Extract Other (See Comments)    sneezing    Patient Measurements: Height: 6' (182.9 cm) Weight: 179 lb 0.2 oz (81.2 kg) IBW/kg (Calculated) : 77.6  Vital Signs: Temp: 97.9 F (36.6 C) (02/14 0535) Temp Source: Oral (02/14 0535) BP: 102/73 (02/14 0535) Pulse Rate: 73 (02/14 0535)  Labs: Recent Labs    04/07/18 1221 04/07/18 1606 04/07/18 2209 04/08/18 0318  HGB 15.3 13.0  --   --   HCT 45.0 41.7  --   --   PLT  --  154  --   --   CREATININE  --  1.49*  --  1.44*  TROPONINI  --  <0.03 0.03* <0.03    Estimated Creatinine Clearance: 53.1 mL/min (A) (by C-G formula based on SCr of 1.44 mg/dL (H)).   Assessment: 68 year old male transitioning from Xarelto to Heparin in preparation for cath on Monday Last dose of Xarelto at 1800 pm 2/15  Goal of Therapy:  Heparin level 0.3-0.7 units/ml Monitor platelets by anticoagulation protocol: Yes  PTT = 66 to 102 seconds   Plan:  Heparin at 1100 units / hr starting at 1800 pm Daily heparin level, CBC, PTT  Thank you Anette Guarneri, PharmD [phone]  04/09/2018,11:48 AM

## 2018-04-09 NOTE — Care Management Important Message (Signed)
Important Message  Patient Details  Name: EMMERT ROETHLER MRN: [id number] Date of Birth: 1950/08/07   Medicare Important Message Given:  Yes    Lukis Bunt P Douglas 04/09/2018, 11:51 AM

## 2018-04-10 LAB — BASIC METABOLIC PANEL
Anion gap: 8 (ref 5–15)
BUN: 18 mg/dL (ref 8–23)
CO2: 30 mmol/L (ref 22–32)
Calcium: 9.5 mg/dL (ref 8.9–10.3)
Chloride: 101 mmol/L (ref 98–111)
Creatinine, Ser: 1.67 mg/dL — ABNORMAL HIGH (ref 0.61–1.24)
GFR calc Af Amer: 48 mL/min — ABNORMAL LOW (ref >60)
GFR, EST NON AFRICAN AMERICAN: 41 mL/min — AB (ref >60)
Glucose, Bld: 119 mg/dL — ABNORMAL HIGH (ref 70–99)
POTASSIUM: 3.7 mmol/L (ref 3.5–5.1)
Sodium: 139 mmol/L (ref 135–145)

## 2018-04-10 LAB — APTT
APTT: 79 s — AB (ref 24–36)
aPTT: 66 seconds — ABNORMAL HIGH (ref 24–36)

## 2018-04-10 LAB — CBC
HEMATOCRIT: 41.5 % (ref 39.0–52.0)
Hemoglobin: 13.9 g/dL (ref 13.0–17.0)
MCH: 30.1 pg (ref 26.0–34.0)
MCHC: 33.5 g/dL (ref 30.0–36.0)
MCV: 89.8 fL (ref 80.0–100.0)
Platelets: 168 10*3/uL (ref 150–400)
RBC: 4.62 MIL/uL (ref 4.22–5.81)
RDW: 13.6 % (ref 11.5–15.5)
WBC: 7.4 10*3/uL (ref 4.0–10.5)
nRBC: 0 % (ref 0.0–0.2)

## 2018-04-10 LAB — HEPARIN LEVEL (UNFRACTIONATED)
Heparin Unfractionated: 0.58 IU/mL (ref 0.30–0.70)
Heparin Unfractionated: 0.57 IU/mL (ref 0.30–0.70)

## 2018-04-10 MED ORDER — FUROSEMIDE 10 MG/ML IJ SOLN: 40.0000 mg | Freq: Every day | INTRAMUSCULAR | Status: DC

## 2018-04-10 NOTE — Progress Notes (Signed)
Progress Note  Patient Name: Alan [name] Date of Encounter: 04/10/2018  Primary Cardiologist: Sanda Klein, MD   Subjective   Feeling well.  No lightheadedness or dizziness.    Inpatient Medications    Scheduled Meds: . furosemide  40 mg Intravenous BID   Continuous Infusions: . heparin 1,200 Units/hr (04/10/18 0634)   PRN Meds: acetaminophen, ondansetron (ZOFRAN) IV   Vital Signs    Vitals:   04/09/18 0535 04/09/18 1958 04/10/18 0423 04/10/18 0828  BP: 102/73 127/82 115/71   Pulse: 73 85 70   Resp: 17 19 (!) 25   Temp: 97.9 F (36.6 C) 98 F (36.7 C) 98 F (36.7 C)   TempSrc: Oral Oral Oral   SpO2: 94% 94% 92% 91%  Weight:   74.9 kg   Height:        Intake/Output Summary (Last 24 hours) at 04/10/2018 1040 Last data filed at 04/10/2018 0630 Gross per 24 hour  Intake 386.12 ml  Output -  Net 386.12 ml   Last 3 Weights 04/10/2018 04/09/2018 04/08/2018  Weight (lbs) 165 lb 1.6 oz 179 lb 0.2 oz 179 lb 3.7 oz  Weight (kg) 74.889 kg 81.2 kg 81.3 kg      Telemetry    Sinus rhythm.  PVCs.  Ventricular bigeminy.  - Personally Reviewed  ECG    04/07/18: Sinus rhythm.  Rate 69 bpm.  LA enlargement.  Prior septal infarct.   - Personally Reviewed  Physical Exam   VS:  BP 115/71 (BP Location: Right Arm)   Pulse 70   Temp 98 F (36.7 C) (Oral)   Resp (!) 25   Ht 6' (1.829 m)   Wt 74.9 kg   SpO2 91%   BMI 22.39 kg/m  , BMI Body mass index is 22.39 kg/m. GENERAL:  Well appearing HEENT: Pupils equal round and reactive, fundi not visualized, oral mucosa unremarkable NECK:  jugular venous distention 1 cm above clavicle at 45 degrees.  Carotid upstroke brisk and symmetric, no bruits LUNGS:  Clear to auscultation bilaterally HEART:  RRR.  PMI not displaced or sustained,S1 and S2 within normal limits, no S3, no S4, no clicks, no rubs, II/VI systolic murmur at LUSB.  III/VI holosystolic murmur at the apex. ABD:  Flat, positive bowel sounds normal in  frequency in pitch, no bruits, no rebound, no guarding, no midline pulsatile mass, no hepatomegaly, no splenomegaly EXT:  2 plus pulses throughout, no edema, no cyanosis no clubbing SKIN:  No rashes no nodules NEURO:  Cranial nerves II through XII grossly intact, motor grossly intact throughout [facility]:  Cognitively intact, oriented to person place and time   Labs    Chemistry Recent Labs  Lab 04/07/18 1221 04/07/18 1606 04/08/18 0318  NA 140 141 138  K 4.6 4.5 4.5  CL  --  110 107  CO2  --  23 19*  GLUCOSE 89 106* 91  BUN  --  19 20  CREATININE  --  1.49* 1.44*  CALCIUM  --  8.2* 8.8*  PROT  --  5.0*  --   ALBUMIN  --  3.0*  --   AST  --  34  --   ALT  --  26  --   ALKPHOS  --  30*  --   BILITOT  --  1.2  --   GFRNONAA  --  47* 49*  GFRAA  --  55* 57*  ANIONGAP  --  8 12     Hematology  Recent Labs  Lab 04/07/18 1221 04/07/18 1606 04/10/18 0415  WBC  --  7.3 7.4  RBC  --  4.49 4.62  HGB 15.3 13.0 13.9  HCT 45.0 41.7 41.5  MCV  --  92.9 89.8  MCH  --  29.0 30.1  MCHC  --  31.2 33.5  RDW  --  13.7 13.6  PLT  --  154 168    Cardiac Enzymes Recent Labs  Lab 04/07/18 1606 04/07/18 2209 04/08/18 0318  TROPONINI <0.03 0.03* <0.03   No results for input(s): TROPIPOC in the last 168 hours.   BNPNo results for input(s): BNP, PROBNP in the last 168 hours.   DDimer No results for input(s): DDIMER in the last 168 hours.   Radiology    No results found.  Cardiac Studies   Echo 04/07/18: IMPRESSIONS  1. The left ventricle has hyperdynamic systolic function of >59%. The cavity size was normal. Left ventricular diastolic Doppler parameters are consistent with restrictive filling Elevated mean left atrial pressure There is right ventricular  volume/pressure overload. No evidence of left ventricular regional wall motion abnormalities.  2. The right ventricle has normal systolic function. The cavity was severely enlarged. There is no increase in right ventricular  wall thickness. Right ventricular systolic pressure is mildly elevated with an estimated pressure of 42.8 mmHg.  3. Left atrial size was severely dilated.  4. Right atrial size was severely dilated.  5. There is mild thickening. Mitral valve regurgitation is severe by color flow Doppler. The MR jet is centrally-directed. Mild increase in gradients in the range of mitral valve stenosis, due to severe regurgitation and previous repair.  6. The tricuspid valve is normal in structure. Tricuspid valve regurgitation is moderate-severe.  7. The inferior vena cava was dilated in size with <50% respiratory variability.  8. There is marked worsening of the mitral insufficiency compared to 2016. Recommend TEE for further evaluation.  9. Right atrial pressure is estimated at 15 mmHg.  TEE 04/08/18:  IMPRESSIONS    1. The left ventricle has has normal systolic function, with an ejection fraction of 60-65%. The cavity size was normal.  2. The right ventricle has mildly reduced systolic function. The cavity was mildly enlarged. There is no increase in right ventricular wall thickness.  3. Left atrial size was moderately dilated.  4. Right atrial size was severely dilated.  5. The mitral valve is myxomatous. There is moderate thickening of the anterior leaflet. Mitral valve regurgitation is severe by color flow Doppler. PISA radius 0.8cm.  6. There is tethering of the inferior leaflet and malcoaptation.  7. The tricuspid valve was normal in structure.  8. The pulmonic valve was normal in structure.  Patient Profile     68 y.o. 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    Assessment & Plan    # s/p  mitral valve repair: # Severe mitral regurgitation: Alan [name] developed symptomatic hypotension and bradycardia after DCCV.  Stat echo revealed severe mitral regurgitation.  Inflow gradient was also moderately elevated.  TEE images were personally reviewed and his case was discussed with Dr. Roxy Manns.  It appears that there is tethering of the posterior leaflet.  There is some mild thickening of the anterior leaflet.  He does have  severe, centrally located mitral regurgitation.  Structurally, the valve does not actually look that bad.  There is some question as to whether this could be ischemia mediated.  He is volume overloaded which is also likely contributing.  Continue diuresis.  Will reduce lasix to 40mg  IV daily. F/u BMP. Plan for left heart catheterization on Monday.  If this is ischemic MR then we will treat his coronary disease and reassess the mitral valve in several months.  If it is primary MR and there is no significant progression in his coronary disease, he may be a candidate for a minimally invasive mitral valve replacement/repair.  There is concern for increased mitral valve gradients with MitraClip given that his mean gradient was 5 mmHg.  I/o not recorded ?Weight down 74.9kg from 81.2kg  # Syncope: 2/2 hypotension and bradycardia post procedure.  This was likely 2/2 anesthesia, bradycardia and severe MR as above.  He is now feeling well.  He temporarily required dopamine but has been off soon after DCCV.  # Persistent atrial fibrillation: Maintaining sinus rhythm. Home metoprolol is on hold.   Xarelto is on hold and he is on heparin with plans for cath Monday.  For questions or updates, please contact [place] Please consult [email or web address] for contact info under        Signed, Skeet Latch, MD  04/10/2018, 10:40 AM

## 2018-04-10 NOTE — Progress Notes (Signed)
[place] for Xarelto to Heparin Indication: atrial fibrillation  Allergies  Allergen Reactions  . Pollen Extract Other (See Comments)    sneezing    Patient Measurements: Height: 6' (182.9 cm) Weight: 165 lb 1.6 oz (74.9 kg) IBW/kg (Calculated) : 77.6  Vital Signs: Temp: 98 F (36.7 C) (02/15 0423) Temp Source: Oral (02/15 0423) BP: 115/71 (02/15 0423) Pulse Rate: 70 (02/15 0423)  Labs: Recent Labs    04/07/18 1606 04/07/18 2209 04/08/18 0318 04/10/18 0415 04/10/18 1400  HGB 13.0  --   --  13.9  --   HCT 41.7  --   --  41.5  --   PLT 154  --   --  168  --   APTT  --   --   --  66* 79*  HEPARINUNFRC  --   --   --  0.58 0.57  CREATININE 1.49*  --  1.44*  --  1.67*  TROPONINI <0.03 0.03* <0.03  --   --     Estimated Creatinine Clearance: 44.2 mL/min (A) (by C-G formula based on SCr of 1.67 mg/dL (H)).   Assessment: 68 year old male transitioning from Xarelto to Heparin in preparation for cath on Monday. Last dose of Xarelto on 2/11. APTT and heparin level are at goal and appear to be correlating. CBC wnl. No active bleed issues documented.  Goal of Therapy:  Heparin level 0.3-0.7 units/ml Monitor platelets by anticoagulation protocol: Yes  PTT = 66 to 102 seconds   Plan:  Continue heparin IV at 1200 units/hr Monitor daily heparin level and CBC, s/sx bleeding D/c aPTT monitoring  Elicia Lamp, PharmD, BCPS Please check AMION for all [place] contact numbers Clinical Pharmacist 04/10/2018 3:19 PM

## 2018-04-10 NOTE — Progress Notes (Signed)
[place] for Xarelto to Heparin Indication: atrial fibrillation  Allergies  Allergen Reactions  . Pollen Extract Other (See Comments)    sneezing    Patient Measurements: Height: 6' (182.9 cm) Weight: 165 lb 1.6 oz (74.9 kg) IBW/kg (Calculated) : 77.6  Vital Signs: Temp: 98 F (36.7 C) (02/15 0423) Temp Source: Oral (02/15 0423) BP: 115/71 (02/15 0423) Pulse Rate: 70 (02/15 0423)  Labs: Recent Labs    04/07/18 1221 04/07/18 1606 04/07/18 2209 04/08/18 0318 04/10/18 0415  HGB 15.3 13.0  --   --  13.9  HCT 45.0 41.7  --   --  41.5  PLT  --  154  --   --  168  APTT  --   --   --   --  66*  HEPARINUNFRC  --   --   --   --  0.58  CREATININE  --  1.49*  --  1.44*  --   TROPONINI  --  <0.03 0.03* <0.03  --     Estimated Creatinine Clearance: 51.3 mL/min (A) (by C-G formula based on SCr of 1.44 mg/dL (H)).   Assessment: 68 year old male transitioning from Xarelto to Heparin in preparation for cath on Monday. Last dose of Xarelto at 1800 pm 2/15. Initial heparin level is at goal but maybe still impacted from [place]. APTT is at the very lower end of goal range.   Goal of Therapy:  Heparin level 0.3-0.7 units/ml Monitor platelets by anticoagulation protocol: Yes  PTT = 66 to 102 seconds   Plan:  Increase heparin gtt to 1200 units/hr Check an 8 hr heparin level and aPTT  Salome Arnt, PharmD, BCPS Please see AMION for all pharmacy numbers 04/10/2018 5:23 AM

## 2018-04-11 LAB — BASIC METABOLIC PANEL
Anion gap: 12 (ref 5–15)
BUN: 18 mg/dL (ref 8–23)
CO2: 26 mmol/L (ref 22–32)
Calcium: 9.6 mg/dL (ref 8.9–10.3)
Chloride: 101 mmol/L (ref 98–111)
Creatinine, Ser: 1.12 mg/dL (ref 0.61–1.24)
GFR calc Af Amer: >60 mL/min (ref >60)
GFR calc non Af Amer: >60 mL/min (ref >60)
Glucose, Bld: 94 mg/dL (ref 70–99)
POTASSIUM: 3.5 mmol/L (ref 3.5–5.1)
Sodium: 139 mmol/L (ref 135–145)

## 2018-04-11 LAB — CBC
HCT: 47 % (ref 39.0–52.0)
HEMOGLOBIN: 15.4 g/dL (ref 13.0–17.0)
MCH: 29.3 pg (ref 26.0–34.0)
MCHC: 32.8 g/dL (ref 30.0–36.0)
MCV: 89.4 fL (ref 80.0–100.0)
Platelets: 226 10*3/uL (ref 150–400)
RBC: 5.26 MIL/uL (ref 4.22–5.81)
RDW: 13.3 % (ref 11.5–15.5)
WBC: 9.1 10*3/uL (ref 4.0–10.5)
nRBC: 0 % (ref 0.0–0.2)

## 2018-04-11 LAB — HEPARIN LEVEL (UNFRACTIONATED): Heparin Unfractionated: 0.44 IU/mL (ref 0.30–0.70)

## 2018-04-11 MED ORDER — SODIUM CHLORIDE 0.9% FLUSH: 3.0000 mL | Freq: Two times a day (BID) | INTRAVENOUS | Status: DC

## 2018-04-11 MED ORDER — ASPIRIN 81 MG PO CHEW
81.0000 mg | CHEWABLE_TABLET | ORAL | Status: AC
  Administered 2018-04-12: 81 mg via ORAL
  Filled 2018-04-11: qty 1

## 2018-04-11 MED ORDER — SODIUM CHLORIDE 0.9 % IV SOLN: 250.0000 mL | INTRAVENOUS | Status: DC | PRN

## 2018-04-11 MED ORDER — FUROSEMIDE 40 MG PO TABS
40.0000 mg | ORAL_TABLET | Freq: Every day | ORAL | Status: DC
  Administered 2018-04-11 – 2018-04-12 (×2): 40 mg via ORAL
  Filled 2018-04-11 (×2): qty 1

## 2018-04-11 MED ORDER — SODIUM CHLORIDE 0.9% FLUSH: 3.0000 mL | INTRAVENOUS | Status: DC | PRN

## 2018-04-11 MED ORDER — SODIUM CHLORIDE 0.9 % IV SOLN
INTRAVENOUS | Status: DC
  Administered 2018-04-12: 01:00:00 via INTRAVENOUS

## 2018-04-11 NOTE — Progress Notes (Signed)
Orders for cath placed by MD on 04/09/2018. Patient added to the cath board 04/11/2018 for [facility].

## 2018-04-11 NOTE — Progress Notes (Signed)
[place] for Xarelto to Heparin Indication: atrial fibrillation  Allergies  Allergen Reactions  . Pollen Extract Other (See Comments)    sneezing    Patient Measurements: Height: 6' (182.9 cm) Weight: 162 lb 4.1 oz (73.6 kg) IBW/kg (Calculated) : 77.6  Vital Signs: Temp: 97.9 F (36.6 C) (02/16 0313) Temp Source: Oral (02/16 0313) BP: 112/82 (02/16 0313) Pulse Rate: 74 (02/16 0009)  Labs: Recent Labs    04/10/18 0415 04/10/18 1400 04/11/18 0316  HGB 13.9  --  15.4  HCT 41.5  --  47.0  PLT 168  --  226  APTT 66* 79*  --   HEPARINUNFRC 0.58 0.57 0.44  CREATININE  --  1.67* 1.12    Estimated Creatinine Clearance: 64.8 mL/min (by C-G formula based on SCr of 1.12 mg/dL).   Assessment: 68 year old male transitioning from Xarelto to Heparin in preparation for cath on Monday. Last dose of Xarelto on 2/11. Heparin level remains at goal. No longer monitoring aPTT. CBC wnl. No active bleed issues documented.  Goal of Therapy:  Heparin level 0.3-0.7 units/ml Monitor platelets by anticoagulation protocol: Yes   Plan:  Continue heparin IV at 1200 units/hr Monitor daily heparin level and CBC, s/sx bleeding Cath planned for Monday  Elicia Lamp, PharmD, BCPS Please check AMION for all [place] contact numbers Clinical Pharmacist 04/11/2018 10:34 AM

## 2018-04-11 NOTE — Progress Notes (Signed)
Progress Note  Patient Name: Alan [name] Date of Encounter: 04/11/2018  Primary Cardiologist: Sanda Klein, MD   Subjective   Feeling well.  No lightheadedness or dizziness.  He notices that his breathing is better.  He sometimes feels slow to process.  This is new since his event Wednesday.   Inpatient Medications    Scheduled Meds:  Continuous Infusions: . heparin 1,200 Units/hr (04/10/18 1532)   PRN Meds: acetaminophen, ondansetron (ZOFRAN) IV   Vital Signs    Vitals:   04/10/18 0828 04/10/18 2043 04/11/18 0009 04/11/18 0313  BP:  126/80 124/81 112/82  Pulse:  84 74   Resp:  (!) 22 (!) 21 11  Temp:  98.2 F (36.8 C) 98.4 F (36.9 C) 97.9 F (36.6 C)  TempSrc:  Oral Oral Oral  SpO2: 91% 98% 99% 98%  Weight:    73.6 kg  Height:        Intake/Output Summary (Last 24 hours) at 04/11/2018 0926 Last data filed at 04/11/2018 0318 Gross per 24 hour  Intake 875.07 ml  Output 750 ml  Net 125.07 ml   Last 3 Weights 04/11/2018 04/10/2018 04/09/2018  Weight (lbs) 162 lb 4.1 oz 165 lb 1.6 oz 179 lb 0.2 oz  Weight (kg) 73.6 kg 74.889 kg 81.2 kg      Telemetry    Sinus rhythm.  PVCs.  PACs..  - Personally Reviewed  ECG    04/07/18: Sinus rhythm.  Rate 69 bpm.  LA enlargement.  Prior septal infarct.   - Personally Reviewed  Physical Exam   VS:  BP 112/82 (BP Location: Right Arm)   Pulse 74   Temp 97.9 F (36.6 C) (Oral)   Resp 11   Ht 6' (1.829 m)   Wt 73.6 kg   SpO2 98%   BMI 22.01 kg/m  , BMI Body mass index is 22.01 kg/m. GENERAL:  Well appearing HEENT: Pupils equal round and reactive, fundi not visualized, oral mucosa unremarkable NECK:  jugular venous distention 1 cm above clavicle at 45 degrees.  Carotid upstroke brisk and symmetric, no bruits LUNGS:  Clear to auscultation bilaterally HEART:  RRR.  PMI not displaced or sustained,S1 and S2 within normal limits, no S3, no S4, no clicks, no rubs, II/VI systolic murmur at LUSB.  III/VI  holosystolic murmur at the apex. ABD:  Flat, positive bowel sounds normal in frequency in pitch, no bruits, no rebound, no guarding, no midline pulsatile mass, no hepatomegaly, no splenomegaly EXT:  2 plus pulses throughout, no edema, no cyanosis no clubbing SKIN:  No rashes no nodules NEURO:  Cranial nerves II through XII grossly intact, motor grossly intact throughout [facility]:  Cognitively intact, oriented to person place and time   Labs    Chemistry Recent Labs  Lab 04/07/18 1606 04/08/18 0318 04/10/18 1400 04/11/18 0316  NA 141 138 139 139  K 4.5 4.5 3.7 3.5  CL 110 107 101 101  CO2 23 19* 30 26  GLUCOSE 106* 91 119* 94  BUN 19 20 18 18   CREATININE 1.49* 1.44* 1.67* 1.12  CALCIUM 8.2* 8.8* 9.5 9.6  PROT 5.0*  --   --   --   ALBUMIN 3.0*  --   --   --   AST 34  --   --   --   ALT 26  --   --   --   ALKPHOS 30*  --   --   --   BILITOT 1.2  --   --   --  GFRNONAA 47* 49* 41* >60  GFRAA 55* 57* 48* >60  ANIONGAP 8 12 8 12      Hematology Recent Labs  Lab 04/07/18 1606 04/10/18 0415 04/11/18 0316  WBC 7.3 7.4 9.1  RBC 4.49 4.62 5.26  HGB 13.0 13.9 15.4  HCT 41.7 41.5 47.0  MCV 92.9 89.8 89.4  MCH 29.0 30.1 29.3  MCHC 31.2 33.5 32.8  RDW 13.7 13.6 13.3  PLT 154 168 226    Cardiac Enzymes Recent Labs  Lab 04/07/18 1606 04/07/18 2209 04/08/18 0318  TROPONINI <0.03 0.03* <0.03   No results for input(s): TROPIPOC in the last 168 hours.   BNPNo results for input(s): BNP, PROBNP in the last 168 hours.   DDimer No results for input(s): DDIMER in the last 168 hours.   Radiology    No results found.  Cardiac Studies   Echo 04/07/18: IMPRESSIONS  1. The left ventricle has hyperdynamic systolic function of >37%. The cavity size was normal. Left ventricular diastolic Doppler parameters are consistent with restrictive filling Elevated mean left atrial pressure There is right ventricular  volume/pressure overload. No evidence of left ventricular regional wall  motion abnormalities.  2. The right ventricle has normal systolic function. The cavity was severely enlarged. There is no increase in right ventricular wall thickness. Right ventricular systolic pressure is mildly elevated with an estimated pressure of 42.8 mmHg.  3. Left atrial size was severely dilated.  4. Right atrial size was severely dilated.  5. There is mild thickening. Mitral valve regurgitation is severe by color flow Doppler. The MR jet is centrally-directed. Mild increase in gradients in the range of mitral valve stenosis, due to severe regurgitation and previous repair.  6. The tricuspid valve is normal in structure. Tricuspid valve regurgitation is moderate-severe.  7. The inferior vena cava was dilated in size with <50% respiratory variability.  8. There is marked worsening of the mitral insufficiency compared to 2016. Recommend TEE for further evaluation.  9. Right atrial pressure is estimated at 15 mmHg.  TEE 04/08/18:  IMPRESSIONS    1. The left ventricle has has normal systolic function, with an ejection fraction of 60-65%. The cavity size was normal.  2. The right ventricle has mildly reduced systolic function. The cavity was mildly enlarged. There is no increase in right ventricular wall thickness.  3. Left atrial size was moderately dilated.  4. Right atrial size was severely dilated.  5. The mitral valve is myxomatous. There is moderate thickening of the anterior leaflet. Mitral valve regurgitation is severe by color flow Doppler. PISA radius 0.8cm.  6. There is tethering of the inferior leaflet and malcoaptation.  7. The tricuspid valve was normal in structure.  8. The pulmonic valve was normal in structure.  Patient Profile     68 y.o. 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    Assessment & Plan    # s/p mitral valve repair: # Severe mitral regurgitation: Mr. Bergdoll developed symptomatic hypotension and bradycardia after DCCV.  Stat echo revealed severe mitral regurgitation.  Inflow gradient was also moderately elevated (mean gradient 5 mmHg).  TEE images were personally reviewed and his case was discussed with Dr. Roxy Manns.  It appears that there is tethering of the posterior leaflet.  There  is some mild thickening of the anterior leaflet.  He does have severe, centrally located mitral regurgitation.  Structurally, the valve does not actually look that bad.  There is some question as to whether this could be ischemia mediated.  He was volume overloaded which is also likely contributing.  Switch lasix to 40mg  po daily today.  Plan for left heart catheterization on Monday.  If this is ischemic MR then we will treat his coronary disease and reassess the mitral valve in several months.  If it is primary MR and there is no significant progression in his coronary disease, he may be a candidate for a minimally invasive mitral valve replacement/repair.  There is concern for increased mitral valve gradients with MitraClip given that his mean gradient was 5 mmHg, though this may have been volume related.  # Syncope: 2/2 hypotension and bradycardia post procedure.  This was likely 2/2 anesthesia, bradycardia and severe MR as above.  He is now feeling well.  He temporarily required dopamine but has been off soon after DCCV.  # Persistent atrial fibrillation: Maintaining sinus rhythm. Home metoprolol is on hold.   Xarelto is on hold and he is on heparin with plans for cath Monday.  For questions or updates, please contact [place] Please consult [email or web address] for contact info under        Signed, Skeet Latch, MD  04/11/2018, 9:26 AM

## 2018-04-12 ENCOUNTER — Encounter (HOSPITAL_COMMUNITY)
Admission: RE | Disposition: A | Discharge status: Home / Self Care | Payer: Self-pay | Source: Home / Self Care | Attending: Cardiovascular Disease

## 2018-04-12 DIAGNOSIS — I25119 Atherosclerotic heart disease of native coronary artery with unspecified angina pectoris: Secondary | ICD-10-CM

## 2018-04-12 DIAGNOSIS — I4819 Other persistent atrial fibrillation: Secondary | ICD-10-CM

## 2018-04-12 DIAGNOSIS — I2582 Chronic total occlusion of coronary artery: Secondary | ICD-10-CM

## 2018-04-12 DIAGNOSIS — I340 Nonrheumatic mitral (valve) insufficiency: Secondary | ICD-10-CM

## 2018-04-12 HISTORY — PX: CORONARY CTO INTERVENTION: CATH118236

## 2018-04-12 HISTORY — PX: CORONARY STENT INTERVENTION: CATH118234

## 2018-04-12 HISTORY — PX: RIGHT/LEFT HEART CATH AND CORONARY/GRAFT ANGIOGRAPHY: CATH118267

## 2018-04-12 LAB — POCT I-STAT EG7
Acid-Base Excess: 4 mmol/L — ABNORMAL HIGH (ref 0.0–2.0)
Bicarbonate: 30 mmol/L — ABNORMAL HIGH (ref 20.0–28.0)
CALCIUM ION: 1.14 mmol/L — AB (ref 1.15–1.40)
HCT: 47 % (ref 39.0–52.0)
Hemoglobin: 16 g/dL (ref 13.0–17.0)
O2 Saturation: 75 %
Potassium: 3.5 mmol/L (ref 3.5–5.1)
Sodium: 142 mmol/L (ref 135–145)
TCO2: 31 mmol/L (ref 22–32)
pCO2, Ven: 47.7 mmHg (ref 44.0–60.0)
pH, Ven: 7.406 (ref 7.250–7.430)
pO2, Ven: 40 mmHg (ref 32.0–45.0)

## 2018-04-12 LAB — BASIC METABOLIC PANEL
Anion gap: 9 (ref 5–15)
BUN: 14 mg/dL (ref 8–23)
CHLORIDE: 101 mmol/L (ref 98–111)
CO2: 27 mmol/L (ref 22–32)
Calcium: 9.2 mg/dL (ref 8.9–10.3)
Creatinine, Ser: 1.08 mg/dL (ref 0.61–1.24)
GFR calc Af Amer: >60 mL/min (ref >60)
GFR calc non Af Amer: >60 mL/min (ref >60)
Glucose, Bld: 89 mg/dL (ref 70–99)
Potassium: 3.5 mmol/L (ref 3.5–5.1)
Sodium: 137 mmol/L (ref 135–145)

## 2018-04-12 LAB — POCT I-STAT 7, (LYTES, BLD GAS, ICA,H+H)
Acid-Base Excess: 6 mmol/L — ABNORMAL HIGH (ref 0.0–2.0)
Bicarbonate: 30.7 mmol/L — ABNORMAL HIGH (ref 20.0–28.0)
Calcium, Ion: 1.25 mmol/L (ref 1.15–1.40)
HCT: 46 % (ref 39.0–52.0)
Hemoglobin: 15.6 g/dL (ref 13.0–17.0)
O2 Saturation: 96 %
PCO2 ART: 44.4 mmHg (ref 32.0–48.0)
Potassium: 3.7 mmol/L (ref 3.5–5.1)
Sodium: 137 mmol/L (ref 135–145)
TCO2: 32 mmol/L (ref 22–32)
pH, Arterial: 7.448 (ref 7.350–7.450)
pO2, Arterial: 77 mmHg — ABNORMAL LOW (ref 83.0–108.0)

## 2018-04-12 LAB — POCT ACTIVATED CLOTTING TIME
ACTIVATED CLOTTING TIME: 323 s
Activated Clotting Time: 279 seconds
Activated Clotting Time: 230 seconds

## 2018-04-12 LAB — HEPARIN LEVEL (UNFRACTIONATED): Heparin Unfractionated: 0.38 IU/mL (ref 0.30–0.70)

## 2018-04-12 LAB — CBC
HCT: 45.1 % (ref 39.0–52.0)
HEMOGLOBIN: 15.1 g/dL (ref 13.0–17.0)
MCH: 29.8 pg (ref 26.0–34.0)
MCHC: 33.5 g/dL (ref 30.0–36.0)
MCV: 89 fL (ref 80.0–100.0)
Platelets: 212 10*3/uL (ref 150–400)
RBC: 5.07 MIL/uL (ref 4.22–5.81)
RDW: 13.4 % (ref 11.5–15.5)
WBC: 6.5 10*3/uL (ref 4.0–10.5)
nRBC: 0 % (ref 0.0–0.2)

## 2018-04-12 SURGERY — RIGHT/LEFT HEART CATH AND CORONARY/GRAFT ANGIOGRAPHY
Anesthesia: LOCAL

## 2018-04-12 MED ORDER — HEPARIN (PORCINE) IN NACL 1000-0.9 UT/500ML-% IV SOLN
INTRAVENOUS | Status: DC | PRN
  Administered 2018-04-12 (×2): 500 mL

## 2018-04-12 MED ORDER — NITROGLYCERIN 1 MG/10 ML FOR IR/CATH LAB
INTRA_ARTERIAL | Status: DC | PRN
  Administered 2018-04-12: 200 ug via INTRA_ARTERIAL
  Administered 2018-04-12 (×2): 150 ug via INTRA_ARTERIAL
  Administered 2018-04-12: 200 ug via INTRA_ARTERIAL

## 2018-04-12 MED ORDER — LABETALOL HCL 5 MG/ML IV SOLN: 10.0000 mg | INTRAVENOUS | Status: AC | PRN

## 2018-04-12 MED ORDER — CLOPIDOGREL BISULFATE 75 MG PO TABS
75.0000 mg | ORAL_TABLET | Freq: Every day | ORAL | Status: DC
  Administered 2018-04-13: 75 mg via ORAL
  Filled 2018-04-12: qty 1

## 2018-04-12 MED ORDER — CLOPIDOGREL BISULFATE 300 MG PO TABS
ORAL_TABLET | ORAL | Status: DC | PRN
  Administered 2018-04-12: 600 mg via ORAL

## 2018-04-12 MED ORDER — MIDAZOLAM HCL 2 MG/2ML IJ SOLN
INTRAMUSCULAR | Status: DC | PRN
  Administered 2018-04-12 (×3): 1 mg via INTRAVENOUS

## 2018-04-12 MED ORDER — FENTANYL CITRATE (PF) 100 MCG/2ML IJ SOLN
INTRAMUSCULAR | Status: AC
  Filled 2018-04-12: qty 2

## 2018-04-12 MED ORDER — SODIUM CHLORIDE 0.9 % IV SOLN: 250.0000 mL | INTRAVENOUS | Status: DC | PRN

## 2018-04-12 MED ORDER — HYDRALAZINE HCL 20 MG/ML IJ SOLN: 5.0000 mg | INTRAMUSCULAR | Status: AC | PRN

## 2018-04-12 MED ORDER — RIVAROXABAN 20 MG PO TABS: 20.0000 mg | ORAL_TABLET | Freq: Every day | ORAL | Status: DC

## 2018-04-12 MED ORDER — MIDAZOLAM HCL 2 MG/2ML IJ SOLN
INTRAMUSCULAR | Status: AC
  Filled 2018-04-12: qty 2

## 2018-04-12 MED ORDER — LIDOCAINE HCL (PF) 1 % IJ SOLN
INTRAMUSCULAR | Status: AC
  Filled 2018-04-12: qty 30

## 2018-04-12 MED ORDER — HEPARIN SODIUM (PORCINE) 1000 UNIT/ML IJ SOLN
INTRAMUSCULAR | Status: AC
  Filled 2018-04-12: qty 1

## 2018-04-12 MED ORDER — SODIUM CHLORIDE 0.9 % WEIGHT BASED INFUSION: 1.0000 mL/kg/h | INTRAVENOUS | Status: AC

## 2018-04-12 MED ORDER — ASPIRIN 81 MG PO CHEW
81.0000 mg | CHEWABLE_TABLET | Freq: Every day | ORAL | Status: DC
  Administered 2018-04-13: 09:00:00 81 mg via ORAL
  Filled 2018-04-12: qty 1

## 2018-04-12 MED ORDER — LIDOCAINE HCL (PF) 1 % IJ SOLN
INTRAMUSCULAR | Status: DC | PRN
  Administered 2018-04-12 (×2): 2 mL

## 2018-04-12 MED ORDER — HEPARIN (PORCINE) IN NACL 1000-0.9 UT/500ML-% IV SOLN
INTRAVENOUS | Status: AC
  Filled 2018-04-12: qty 1000

## 2018-04-12 MED ORDER — FENTANYL CITRATE (PF) 100 MCG/2ML IJ SOLN
INTRAMUSCULAR | Status: DC | PRN
  Administered 2018-04-12: 50 ug via INTRAVENOUS
  Administered 2018-04-12 (×2): 25 ug via INTRAVENOUS

## 2018-04-12 MED ORDER — HEPARIN SODIUM (PORCINE) 1000 UNIT/ML IJ SOLN
INTRAMUSCULAR | Status: DC | PRN
  Administered 2018-04-12 (×2): 4000 [IU] via INTRAVENOUS

## 2018-04-12 MED ORDER — SODIUM CHLORIDE 0.9% FLUSH: 3.0000 mL | Freq: Two times a day (BID) | INTRAVENOUS | Status: DC

## 2018-04-12 MED ORDER — SODIUM CHLORIDE 0.9% FLUSH: 3.0000 mL | INTRAVENOUS | Status: DC | PRN

## 2018-04-12 MED ORDER — VERAPAMIL HCL 2.5 MG/ML IV SOLN
INTRAVENOUS | Status: DC | PRN
  Administered 2018-04-12: 14:00:00 via INTRA_ARTERIAL

## 2018-04-12 MED ORDER — CLOPIDOGREL BISULFATE 300 MG PO TABS
ORAL_TABLET | ORAL | Status: AC
  Filled 2018-04-12: qty 2

## 2018-04-12 MED ORDER — ANGIOPLASTY BOOK
Freq: Once | Status: AC
  Administered 2018-04-12: 1
  Filled 2018-04-12: qty 1

## 2018-04-12 MED ORDER — VERAPAMIL HCL 2.5 MG/ML IV SOLN
INTRAVENOUS | Status: AC
  Filled 2018-04-12: qty 2

## 2018-04-12 MED ORDER — IOHEXOL 350 MG/ML SOLN
INTRAVENOUS | Status: DC | PRN
  Administered 2018-04-12: 205 mL via INTRA_ARTERIAL

## 2018-04-12 SURGICAL SUPPLY — 25 items
BALLN MINITREK OTW 1.5X6 (BALLOONS) ×2
BALLN SAPPHIRE 2.0X15 (BALLOONS) ×2
BALLN SAPPHIRE ~~LOC~~ 2.25X15 (BALLOONS) ×1 IMPLANT
BALLN SAPPHIRE ~~LOC~~ 2.75X15 (BALLOONS) ×1 IMPLANT
BALLOON MINITREK OTW 1.5X6 (BALLOONS) IMPLANT
BALLOON SAPPHIRE 2.0X15 (BALLOONS) IMPLANT
CATH BALLN WEDGE 5F 110CM (CATHETERS) ×1 IMPLANT
CATH INFINITI 5 FR 3DRC (CATHETERS) ×1 IMPLANT
CATH INFINITI 5FR MULTPACK ANG (CATHETERS) ×1 IMPLANT
CATH LAUNCHER 6FR EBU3.5 (CATHETERS) ×1 IMPLANT
DEVICE RAD COMP TR BAND LRG (VASCULAR PRODUCTS) ×1 IMPLANT
GLIDESHEATH SLEND SS 6F .021 (SHEATH) ×1 IMPLANT
GUIDEWIRE INQWIRE 1.5J.035X260 (WIRE) IMPLANT
INQWIRE 1.5J .035X260CM (WIRE) ×2
KIT ENCORE 26 ADVANTAGE (KITS) ×1 IMPLANT
KIT HEART LEFT (KITS) ×2 IMPLANT
PACK CARDIAC CATHETERIZATION (CUSTOM PROCEDURE TRAY) ×2 IMPLANT
SHEATH GLIDE SLENDER 4/5FR (SHEATH) ×1 IMPLANT
STENT RESOLUTE ONYX 2.0X30 (Permanent Stent) ×1 IMPLANT
STENT SYNERGY DES 2.25X20 (Permanent Stent) ×1 IMPLANT
TRANSDUCER W/STOPCOCK (MISCELLANEOUS) ×2 IMPLANT
TUBING CIL FLEX 10 FLL-RA (TUBING) ×2 IMPLANT
WIRE COUGAR XT STRL 190CM (WIRE) ×1 IMPLANT
WIRE MICROINTRODUCER 60CM (WIRE) ×1 IMPLANT
WIRE PT2 MS 300CM (WIRE) ×1 IMPLANT

## 2018-04-12 NOTE — Interval H&P Note (Signed)
History and Physical Interval Note:  04/12/2018 2:10 PM  Alan [name]  has presented today for surgery, with the diagnosis of unstable angina  The various methods of treatment have been discussed with the patient and family. After consideration of risks, benefits and other options for treatment, the patient has consented to  Procedure(s): RIGHT/LEFT HEART CATH AND CORONARY ANGIOGRAPHY (N/A) as a surgical intervention .  The patient's history has been reviewed, patient examined, no change in status, stable for surgery.  I have reviewed the patient's chart and labs.  Questions were answered to the patient's satisfaction.     [name]

## 2018-04-12 NOTE — Progress Notes (Addendum)
TR BAND REMOVAL  LOCATION:    Left radial  DEFLATED PER PROTOCOL:    Yes.    TIME BAND OFF / DRESSING APPLIED:    20:30   SITE UPON ARRIVAL:    Level 0  SITE AFTER BAND REMOVAL:    Level 0  CIRCULATION SENSATION AND MOVEMENT:    Within Normal Limits   Yes.    COMMENTS:   Post TR band instructions given. Pt tolerated well.

## 2018-04-12 NOTE — Progress Notes (Signed)
      [address]       [place] [address]             [phone]     CARDIOTHORACIC SURGERY PROGRESS NOTE  4 Days Post-Op  S/P Procedure(s) (LRB): TRANSESOPHAGEAL ECHOCARDIOGRAM (TEE) (N/A)  Subjective: Feels well.  Reports breathing improved  Objective: Vital signs in last 24 hours: Temp:  [97.5 F (36.4 C)-98.4 F (36.9 C)] 97.5 F (36.4 C) (02/17 0800) Pulse Rate:  [73-80] 73 (02/17 0800) Cardiac Rhythm: Normal sinus rhythm (02/17 0700) Resp:  [11-22] 16 (02/17 0800) BP: (110-131)/(72-84) 110/76 (02/17 0800) SpO2:  [94 %-98 %] 97 % (02/17 0800) Weight:  [73.7 kg] 73.7 kg (02/17 0525)  Physical Exam:  Rhythm:   sinus  Breath sounds: clear  Heart sounds:  RRR w/ soft systolic murmur  Incisions:  n/a  Abdomen:  soft  Extremities:  warm   Intake/Output from previous day: 02/16 0701 - 02/17 0700 In: 1118 [P.O.:840; I.V.:278] Out: 1751 [Urine:1750; Stool:1] Intake/Output this shift: No intake/output data recorded.  Lab Results: Recent Labs    04/11/18 0316 04/12/18 0346  WBC 9.1 6.5  HGB 15.4 15.1  HCT 47.0 45.1  PLT 226 212   BMET:  Recent Labs    04/11/18 0316 04/12/18 0346  NA 139 137  K 3.5 3.5  CL 101 101  CO2 26 27  GLUCOSE 94 89  BUN 18 14  CREATININE 1.12 1.08  CALCIUM 9.6 9.2    CBG (last 3)  No results for input(s): GLUCAP in the last 72 hours. PT/INR:  No results for input(s): LABPROT, INR in the last 72 hours.  CXR:  N/A  Assessment/Plan: S/P Procedure(s) (LRB): TRANSESOPHAGEAL ECHOCARDIOGRAM (TEE) (N/A)  Clinically doing well and maintaining NSR Symptoms much improved and murmur less prominent on exam Weight down 8 kg since admission For cath today Will continue to follow  I spent in excess of 15 minutes during the conduct of this hospital encounter and >50% of this time involved direct face-to-face encounter with the patient for counseling and/or coordination of their care.   Rexene Alberts,  MD 04/12/2018 8:59 AM

## 2018-04-12 NOTE — Progress Notes (Signed)
Progress Note  Patient Name: Alan [name] Date of Encounter: 04/12/2018  Primary Cardiologist: Sanda Klein, MD   Subjective   No complaints this am. No pain or dyspnea.   Inpatient Medications    Scheduled Meds: . furosemide  40 mg Oral Daily  . sodium chloride flush  3 mL Intravenous Q12H   Continuous Infusions: . sodium chloride    . sodium chloride 10 mL/hr at 04/12/18 0038  . heparin 1,200 Units/hr (04/11/18 1232)   PRN Meds: sodium chloride, acetaminophen, ondansetron (ZOFRAN) IV, sodium chloride flush   Vital Signs    Vitals:   04/11/18 1758 04/11/18 2016 04/12/18 0525 04/12/18 0800  BP:  115/72 131/84 110/76  Pulse: 77 80 74 73  Resp: (!) 21 16 15 16   Temp:  97.8 F (36.6 C) 97.8 F (36.6 C) (!) 97.5 F (36.4 C)  TempSrc:  Oral Oral Oral  SpO2: 94% 95% 95% 97%  Weight:   73.7 kg   Height:   6' (1.829 m)     Intake/Output Summary (Last 24 hours) at 04/12/2018 1002 Last data filed at 04/12/2018 0530 Gross per 24 hour  Intake 758 ml  Output 1101 ml  Net -343 ml   Last 3 Weights 04/12/2018 04/11/2018 04/10/2018  Weight (lbs) 162 lb 8 oz 162 lb 4.1 oz 165 lb 1.6 oz  Weight (kg) 73.71 kg 73.6 kg 74.889 kg      Telemetry     Sinus- Personally Reviewed  ECG    N/A  - Personally Reviewed  Physical Exam   VS:  BP 110/76 (BP Location: Right Arm)   Pulse 73   Temp (!) 97.5 F (36.4 C) (Oral)   Resp 16   Ht 6' (1.829 m)   Wt 73.7 kg   SpO2 97%   BMI 22.04 kg/m  , BMI Body mass index is 22.04 kg/m.  General: Well developed, well nourished, NAD  HEENT: OP clear, mucus membranes moist  SKIN: warm, dry. No rashes. Neuro: No focal deficits  Musculoskeletal: Muscle strength 5/5 all ext  Psychiatric: Mood and affect normal  Neck: No JVD, no carotid bruits, no thyromegaly, no lymphadenopathy.  Lungs:Clear bilaterally, no wheezes, rhonci, crackles Cardiovascular: Regular rate and rhythm. Systolic murmur.  Abdomen:Soft. Bowel sounds  present. Non-tender.  Extremities: No lower extremity edema. Pulses are 2 + in the bilateral DP/PT.    Labs    Chemistry Recent Labs  Lab 04/07/18 1606  04/10/18 1400 04/11/18 0316 04/12/18 0346  NA 141   < > 139 139 137  K 4.5   < > 3.7 3.5 3.5  CL 110   < > 101 101 101  CO2 23   < > 30 26 27   GLUCOSE 106*   < > 119* 94 89  BUN 19   < > 18 18 14   CREATININE 1.49*   < > 1.67* 1.12 1.08  CALCIUM 8.2*   < > 9.5 9.6 9.2  PROT 5.0*  --   --   --   --   ALBUMIN 3.0*  --   --   --   --   AST 34  --   --   --   --   ALT 26  --   --   --   --   ALKPHOS 30*  --   --   --   --   BILITOT 1.2  --   --   --   --   GFRNONAA 47*   < >  41* >60 >60  GFRAA 55*   < > 48* >60 >60  ANIONGAP 8   < > 8 12 9    < > = values in this interval not displayed.     Hematology Recent Labs  Lab 04/10/18 0415 04/11/18 0316 04/12/18 0346  WBC 7.4 9.1 6.5  RBC 4.62 5.26 5.07  HGB 13.9 15.4 15.1  HCT 41.5 47.0 45.1  MCV 89.8 89.4 89.0  MCH 30.1 29.3 29.8  MCHC 33.5 32.8 33.5  RDW 13.6 13.3 13.4  PLT 168 226 212    Cardiac Enzymes Recent Labs  Lab 04/07/18 1606 04/07/18 2209 04/08/18 0318  TROPONINI <0.03 0.03* <0.03   No results for input(s): TROPIPOC in the last 168 hours.   BNPNo results for input(s): BNP, PROBNP in the last 168 hours.   DDimer No results for input(s): DDIMER in the last 168 hours.   Radiology    No results found.  Cardiac Studies   Echo 04/07/18: IMPRESSIONS  1. The left ventricle has hyperdynamic systolic function of >16%. The cavity size was normal. Left ventricular diastolic Doppler parameters are consistent with restrictive filling Elevated mean left atrial pressure There is right ventricular  volume/pressure overload. No evidence of left ventricular regional wall motion abnormalities.  2. The right ventricle has normal systolic function. The cavity was severely enlarged. There is no increase in right ventricular wall thickness. Right ventricular systolic  pressure is mildly elevated with an estimated pressure of 42.8 mmHg.  3. Left atrial size was severely dilated.  4. Right atrial size was severely dilated.  5. There is mild thickening. Mitral valve regurgitation is severe by color flow Doppler. The MR jet is centrally-directed. Mild increase in gradients in the range of mitral valve stenosis, due to severe regurgitation and previous repair.  6. The tricuspid valve is normal in structure. Tricuspid valve regurgitation is moderate-severe.  7. The inferior vena cava was dilated in size with <50% respiratory variability.  8. There is marked worsening of the mitral insufficiency compared to 2016. Recommend TEE for further evaluation.  9. Right atrial pressure is estimated at 15 mmHg.  TEE 04/08/18:  IMPRESSIONS    1. The left ventricle has has normal systolic function, with an ejection fraction of 60-65%. The cavity size was normal.  2. The right ventricle has mildly reduced systolic function. The cavity was mildly enlarged. There is no increase in right ventricular wall thickness.  3. Left atrial size was moderately dilated.  4. Right atrial size was severely dilated.  5. The mitral valve is myxomatous. There is moderate thickening of the anterior leaflet. Mitral valve regurgitation is severe by color flow Doppler. PISA radius 0.8cm.  6. There is tethering of the inferior leaflet and malcoaptation.  7. The tricuspid valve was normal in structure.  8. The pulmonic valve was normal in structure.  Patient Profile     68 y.o. 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    Assessment & Plan    1. Severe mitral regurgitation s/p mitral valve  repair in 2016: Now with severe MR. He developed symptomatic hypotension and bradycardia after DCCV.  Stat echo revealed severe mitral regurgitation. Structurally, the valve is intact. Question of ischemia mediated MR. He was volume overloaded on admission which  was also likely contributing.  Volume status is now better. He is on Lasix 40 mg daily. Plans in place for cardiac catheterization today. If this is ischemic MR then we will treat his coronary disease and reassess the mitral valve in several months.  If it is primary MR and there is no significant progression in his coronary disease, he may be a candidate for a minimally invasive mitral valve replacement/repair.  Dr. Roxy Manns is following as well.   2. Persistent atrial fibrillation: He is in sinus today. Xarelto on hold for cath today. He is on heparin.    For questions or updates, please contact [place] Please consult [email or web address] for contact info under        Signed, Lauree Chandler, MD  04/12/2018, 10:02 AM

## 2018-04-12 NOTE — Progress Notes (Signed)
[place] for Xarelto to Heparin Indication: atrial fibrillation  Allergies  Allergen Reactions  . Pollen Extract Other (See Comments)    sneezing    Patient Measurements: Height: 6' (182.9 cm) Weight: 162 lb 8 oz (73.7 kg) IBW/kg (Calculated) : 77.6  Vital Signs: Temp: 97.5 F (36.4 C) (02/17 0800) Temp Source: Oral (02/17 0800) BP: 110/76 (02/17 0800) Pulse Rate: 73 (02/17 0800)  Labs: Recent Labs    04/10/18 0415 04/10/18 1400 04/11/18 0316 04/12/18 0346  HGB 13.9  --  15.4 15.1  HCT 41.5  --  47.0 45.1  PLT 168  --  226 212  APTT 66* 79*  --   --   HEPARINUNFRC 0.58 0.57 0.44 0.38  CREATININE  --  1.67* 1.12 1.08    Estimated Creatinine Clearance: 67.3 mL/min (by C-G formula based on SCr of 1.08 mg/dL).   Assessment: 68 year old male transitioning from Xarelto to Heparin in preparation for cath on Monday. Last dose of Xarelto on 2/11.  Heparin level continue to be at goal. CBC wnl. No active bleed issues documented.  Goal of Therapy:  Heparin level 0.3-0.7 units/ml Monitor platelets by anticoagulation protocol: Yes   Plan:  Continue heparin IV at 1200 units/hr Monitor daily heparin level and CBC, s/sx bleeding Cath planned for today, Monday  Erin Hearing PharmD., BCPS Clinical Pharmacist 04/12/2018 8:48 AM

## 2018-04-12 NOTE — H&P (View-Only) (Signed)
Progress Note  Patient Name: Alan [name] Date of Encounter: 04/12/2018  Primary Cardiologist: Sanda Klein, MD   Subjective   No complaints this am. No pain or dyspnea.   Inpatient Medications    Scheduled Meds: . furosemide  40 mg Oral Daily  . sodium chloride flush  3 mL Intravenous Q12H   Continuous Infusions: . sodium chloride    . sodium chloride 10 mL/hr at 04/12/18 0038  . heparin 1,200 Units/hr (04/11/18 1232)   PRN Meds: sodium chloride, acetaminophen, ondansetron (ZOFRAN) IV, sodium chloride flush   Vital Signs    Vitals:   04/11/18 1758 04/11/18 2016 04/12/18 0525 04/12/18 0800  BP:  115/72 131/84 110/76  Pulse: 77 80 74 73  Resp: (!) 21 16 15 16   Temp:  97.8 F (36.6 C) 97.8 F (36.6 C) (!) 97.5 F (36.4 C)  TempSrc:  Oral Oral Oral  SpO2: 94% 95% 95% 97%  Weight:   73.7 kg   Height:   6' (1.829 m)     Intake/Output Summary (Last 24 hours) at 04/12/2018 1002 Last data filed at 04/12/2018 0530 Gross per 24 hour  Intake 758 ml  Output 1101 ml  Net -343 ml   Last 3 Weights 04/12/2018 04/11/2018 04/10/2018  Weight (lbs) 162 lb 8 oz 162 lb 4.1 oz 165 lb 1.6 oz  Weight (kg) 73.71 kg 73.6 kg 74.889 kg      Telemetry     Sinus- Personally Reviewed  ECG    N/A  - Personally Reviewed  Physical Exam   VS:  BP 110/76 (BP Location: Right Arm)   Pulse 73   Temp (!) 97.5 F (36.4 C) (Oral)   Resp 16   Ht 6' (1.829 m)   Wt 73.7 kg   SpO2 97%   BMI 22.04 kg/m  , BMI Body mass index is 22.04 kg/m.  General: Well developed, well nourished, NAD  HEENT: OP clear, mucus membranes moist  SKIN: warm, dry. No rashes. Neuro: No focal deficits  Musculoskeletal: Muscle strength 5/5 all ext  Psychiatric: Mood and affect normal  Neck: No JVD, no carotid bruits, no thyromegaly, no lymphadenopathy.  Lungs:Clear bilaterally, no wheezes, rhonci, crackles Cardiovascular: Regular rate and rhythm. Systolic murmur.  Abdomen:Soft. Bowel sounds  present. Non-tender.  Extremities: No lower extremity edema. Pulses are 2 + in the bilateral DP/PT.    Labs    Chemistry Recent Labs  Lab 04/07/18 1606  04/10/18 1400 04/11/18 0316 04/12/18 0346  NA 141   < > 139 139 137  K 4.5   < > 3.7 3.5 3.5  CL 110   < > 101 101 101  CO2 23   < > 30 26 27   GLUCOSE 106*   < > 119* 94 89  BUN 19   < > 18 18 14   CREATININE 1.49*   < > 1.67* 1.12 1.08  CALCIUM 8.2*   < > 9.5 9.6 9.2  PROT 5.0*  --   --   --   --   ALBUMIN 3.0*  --   --   --   --   AST 34  --   --   --   --   ALT 26  --   --   --   --   ALKPHOS 30*  --   --   --   --   BILITOT 1.2  --   --   --   --   GFRNONAA 47*   < >  41* >60 >60  GFRAA 55*   < > 48* >60 >60  ANIONGAP 8   < > 8 12 9    < > = values in this interval not displayed.     Hematology Recent Labs  Lab 04/10/18 0415 04/11/18 0316 04/12/18 0346  WBC 7.4 9.1 6.5  RBC 4.62 5.26 5.07  HGB 13.9 15.4 15.1  HCT 41.5 47.0 45.1  MCV 89.8 89.4 89.0  MCH 30.1 29.3 29.8  MCHC 33.5 32.8 33.5  RDW 13.6 13.3 13.4  PLT 168 226 212    Cardiac Enzymes Recent Labs  Lab 04/07/18 1606 04/07/18 2209 04/08/18 0318  TROPONINI <0.03 0.03* <0.03   No results for input(s): TROPIPOC in the last 168 hours.   BNPNo results for input(s): BNP, PROBNP in the last 168 hours.   DDimer No results for input(s): DDIMER in the last 168 hours.   Radiology    No results found.  Cardiac Studies   Echo 04/07/18: IMPRESSIONS  1. The left ventricle has hyperdynamic systolic function of >34%. The cavity size was normal. Left ventricular diastolic Doppler parameters are consistent with restrictive filling Elevated mean left atrial pressure There is right ventricular  volume/pressure overload. No evidence of left ventricular regional wall motion abnormalities.  2. The right ventricle has normal systolic function. The cavity was severely enlarged. There is no increase in right ventricular wall thickness. Right ventricular systolic  pressure is mildly elevated with an estimated pressure of 42.8 mmHg.  3. Left atrial size was severely dilated.  4. Right atrial size was severely dilated.  5. There is mild thickening. Mitral valve regurgitation is severe by color flow Doppler. The MR jet is centrally-directed. Mild increase in gradients in the range of mitral valve stenosis, due to severe regurgitation and previous repair.  6. The tricuspid valve is normal in structure. Tricuspid valve regurgitation is moderate-severe.  7. The inferior vena cava was dilated in size with <50% respiratory variability.  8. There is marked worsening of the mitral insufficiency compared to 2016. Recommend TEE for further evaluation.  9. Right atrial pressure is estimated at 15 mmHg.  TEE 04/08/18:  IMPRESSIONS    1. The left ventricle has has normal systolic function, with an ejection fraction of 60-65%. The cavity size was normal.  2. The right ventricle has mildly reduced systolic function. The cavity was mildly enlarged. There is no increase in right ventricular wall thickness.  3. Left atrial size was moderately dilated.  4. Right atrial size was severely dilated.  5. The mitral valve is myxomatous. There is moderate thickening of the anterior leaflet. Mitral valve regurgitation is severe by color flow Doppler. PISA radius 0.8cm.  6. There is tethering of the inferior leaflet and malcoaptation.  7. The tricuspid valve was normal in structure.  8. The pulmonic valve was normal in structure.  Patient Profile     68 y.o. 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    Assessment & Plan    1. Severe mitral regurgitation s/p mitral valve  repair in 2016: Now with severe MR. He developed symptomatic hypotension and bradycardia after DCCV.  Stat echo revealed severe mitral regurgitation. Structurally, the valve is intact. Question of ischemia mediated MR. He was volume overloaded on admission which  was also likely contributing.  Volume status is now better. He is on Lasix 40 mg daily. Plans in place for cardiac catheterization today. If this is ischemic MR then we will treat his coronary disease and reassess the mitral valve in several months.  If it is primary MR and there is no significant progression in his coronary disease, he may be a candidate for a minimally invasive mitral valve replacement/repair.  Dr. Roxy Manns is following as well.   2. Persistent atrial fibrillation: He is in sinus today. Xarelto on hold for cath today. He is on heparin.    For questions or updates, please contact [place] Please consult [email or web address] for contact info under        Signed, Lauree Chandler, MD  04/12/2018, 10:02 AM

## 2018-04-12 NOTE — CV Procedure (Signed)
   Transesophageal Echocardiogram  Indications: Mitral regurgitation  Time out performed  Anesthesia administered Propofol.   IMPRESSIONS    1. The left ventricle has has normal systolic function, with an ejection fraction of 60-65%. The cavity size was normal.  2. The right ventricle has mildly reduced systolic function. The cavity was mildly enlarged. There is no increase in right ventricular wall thickness.  3. Left atrial size was moderately dilated.  4. Right atrial size was severely dilated.  5. The mitral valve is myxomatous. There is moderate thickening of the anterior leaflet. Mitral valve regurgitation is severe by color flow Doppler. PISA radius 0.8cm.  6. There is tethering of the inferior leaflet and malcoaptation.  7. The tricuspid valve was normal in structure.  8. The pulmonic valve was normal in structure.  FINDINGS  Left Ventricle: The left ventricle has has normal systolic function, with an ejection fraction of 60-65%. The cavity size was normal. There is no increase in left ventricular wall thickness. Right Ventricle: The right ventricle has mildly reduced systolic function. The cavity was mildly enlarged. There is no increase in right ventricular wall thickness. Left Atrium: Left atrial size was moderately dilated. Right Atrium: Right atrial size was severely dilated. Interatrial Septum: No atrial level shunt detected by color flow Doppler. Pericardium: There is no evidence of pericardial effusion. Mitral Valve: The mitral valve is myxomatous. There is moderate thickening of the anterior leaflet. Mitral valve regurgitation is severe by color flow Doppler. There is tethering of the inferior leaflet and malcoaptation. Tricuspid Valve: The tricuspid valve was normal in structure. Tricuspid valve regurgitation was not visualized by color flow Doppler. Aortic Valve: Aortic valve regurgitation was not visualized by color flow Doppler. Pulmonic Valve: The pulmonic  valve was normal in structure. Pulmonic valve regurgitation is not visualized by color flow Doppler. Venous: The inferior vena cava is normal in size with greater than 50% respiratory variability.   MR Peak grad: 88.4 mmHg MR Mean grad: 62.0 mmHg MR Vmax:      470.00 cm/s MR Vmean:     379.0 cm/s MR PISA    Candee Furbish MD Electronically signed by Candee Furbish MD Signature Date/Time: 04/08/2018/5:54:43 PM

## 2018-04-13 ENCOUNTER — Encounter (HOSPITAL_COMMUNITY): Payer: Self-pay | Admitting: Cardiovascular Disease

## 2018-04-13 DIAGNOSIS — I5031 Acute diastolic (congestive) heart failure: Secondary | ICD-10-CM

## 2018-04-13 DIAGNOSIS — I5033 Acute on chronic diastolic (congestive) heart failure: Secondary | ICD-10-CM

## 2018-04-13 LAB — CBC
HCT: 46.1 % (ref 39.0–52.0)
Hemoglobin: 15 g/dL (ref 13.0–17.0)
MCH: 28.9 pg (ref 26.0–34.0)
MCHC: 32.5 g/dL (ref 30.0–36.0)
MCV: 88.8 fL (ref 80.0–100.0)
Platelets: 212 10*3/uL (ref 150–400)
RBC: 5.19 MIL/uL (ref 4.22–5.81)
RDW: 13.2 % (ref 11.5–15.5)
WBC: 5.8 10*3/uL (ref 4.0–10.5)
nRBC: 0 % (ref 0.0–0.2)

## 2018-04-13 LAB — BASIC METABOLIC PANEL
Anion gap: 10 (ref 5–15)
BUN: 15 mg/dL (ref 8–23)
CO2: 26 mmol/L (ref 22–32)
Calcium: 9.4 mg/dL (ref 8.9–10.3)
Chloride: 101 mmol/L (ref 98–111)
Creatinine, Ser: 1.28 mg/dL — ABNORMAL HIGH (ref 0.61–1.24)
GFR calc Af Amer: >60 mL/min (ref >60)
GFR calc non Af Amer: 57 mL/min — ABNORMAL LOW (ref >60)
Glucose, Bld: 84 mg/dL (ref 70–99)
POTASSIUM: 3.7 mmol/L (ref 3.5–5.1)
Sodium: 137 mmol/L (ref 135–145)

## 2018-04-13 MED ORDER — ASPIRIN 81 MG PO CHEW: 81.0000 mg | CHEWABLE_TABLET | Freq: Every day | ORAL | Status: AC

## 2018-04-13 MED ORDER — CLOPIDOGREL BISULFATE 75 MG PO TABS: 75.0000 mg | ORAL_TABLET | Freq: Every day | ORAL | 5 refills | Status: DC

## 2018-04-13 MED ORDER — FUROSEMIDE 20 MG PO TABS
20.0000 mg | ORAL_TABLET | Freq: Every day | ORAL | Status: DC
  Administered 2018-04-13: 20 mg via ORAL
  Filled 2018-04-13: qty 1

## 2018-04-13 MED ORDER — FUROSEMIDE 20 MG PO TABS: 20.0000 mg | ORAL_TABLET | Freq: Every day | ORAL | 2 refills | Status: DC

## 2018-04-13 MED ORDER — ATORVASTATIN CALCIUM 40 MG PO TABS: 40.0000 mg | ORAL_TABLET | Freq: Every day | ORAL | 2 refills | Status: DC

## 2018-04-13 NOTE — Discharge Summary (Signed)
Discharge Summary    Patient ID: Alan [name] MRN: [id number]; DOB: [birth year]  Admit date: 04/07/2018 Discharge date: 04/13/2018  Primary Care Provider: Camille Bal, PA-C  Primary Cardiologist: Sanda Klein, MD  Primary Electrophysiologist:  None   Discharge Diagnoses    Active Problems:   Atrial flutter with rapid ventricular response [facility])   Mitral regurgitation   CAD (coronary artery disease)   Acute diastolic CHF (congestive heart failure) (HCC)   Allergies Allergies  Allergen Reactions  . Pollen Extract Other (See Comments)    sneezing    Diagnostic Studies/Procedures    Transthoracic Echocardiogram 04/07/2018: Impressions:  1. The left ventricle has hyperdynamic systolic function of >66%. The cavity size was normal. Left ventricular diastolic Doppler parameters are consistent with restrictive filling Elevated mean left atrial pressure There is right ventricular  volume/pressure overload. No evidence of left ventricular regional wall motion abnormalities.  2. The right ventricle has normal systolic function. The cavity was severely enlarged. There is no increase in right ventricular wall thickness. Right ventricular systolic pressure is mildly elevated with an estimated pressure of 42.8 mmHg.  3. Left atrial size was severely dilated.  4. Right atrial size was severely dilated.  5. There is mild thickening. Mitral valve regurgitation is severe by color flow Doppler. The MR jet is centrally-directed. Mild increase in gradients in the range of mitral valve stenosis, due to severe regurgitation and previous repair.  6. The tricuspid valve is normal in structure. Tricuspid valve regurgitation is moderate-severe.  7. The inferior vena cava was dilated in size with <50% respiratory variability.  8. There is marked worsening of the mitral insufficiency compared to 2016. Recommend TEE for further evaluation.  9. Right atrial pressure is estimated at 15  mmHg. _____________  Transesophageal Echocardiogram 04/08/2018:  1. The left ventricle has has normal systolic function, with an ejection fraction of 60-65%. The cavity size was normal.  2. The right ventricle has mildly reduced systolic function. The cavity was mildly enlarged. There is no increase in right ventricular wall thickness.  3. Left atrial size was moderately dilated.  4. Right atrial size was severely dilated.  5. The mitral valve is myxomatous. There is moderate thickening of the anterior leaflet. Mitral valve regurgitation is severe by color flow Doppler. PISA radius 0.8cm.  6. There is tethering of the inferior leaflet and malcoaptation.  7. The tricuspid valve was normal in structure.  8. The pulmonic valve was normal in structure. _______________  Right/Left Heart Catheterization 04/12/2018:  Ost LAD lesion is 60% stenosed.  Prox LAD to Mid LAD lesion is 100% stenosed.  Ost LPDA to LPDA lesion is 90% stenosed.  A drug-eluting stent was successfully placed using a STENT SYNERGY DES 2.25X20.  Post intervention, there is a 0% residual stenosis.  Ost 1st Mrg to 1st Mrg lesion is 100% stenosed.  A drug-eluting stent was successfully placed using a STENT RESOLUTE ONYX 2.0X30.  Post intervention, there is a 0% residual stenosis.   1.  Severe two-vessel coronary artery disease with total occlusion of the LAD supplied by the left internal mammary artery graft, severe stenosis of the distal circumflex treated successfully with PCI using a drug-eluting stent, and total occlusion of the first OM treated successfully with PCI using a drug-eluting stent 2.  Small nondominant RCA 3.  Normal right heart and left heart hemodynamics with compensated filling pressures and preserved cardiac output  Recommendations: Resume rivaroxaban tomorrow.  Continue clopidogrel for 6 months.  Aspirin 81 mg  x 1 week.   History of Present Illness     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 He underwent aortic valve replacement with a biological prosthesis and mitral valve repair as well as a single-vessel LIMA to LAD bypass for incidentally discovered CAD. He also had a 90% stenosis in the distal segment of the left PDA that was not bypassed.   Patient was seen by Dr. Sallyanne Kuster on 03/17/2018 for shortness of breath. Patient reports shortness of breath started about 2 weeks prior and he has noticed particular difficulty when has to walk uphill. He did not have angina and was not aware of any palpitations. At office visit, patient presented in atrial flutter with 2:1 AV block at a ventricular rate of 120 bpm. He was not aware of tachycardia. Paradoxically, his symptoms have improved over the last couple of day and he feels back to baseline.   Dyspnea was felt to be secondary to atrial flutter so outpatient cardioversion was recommended after 3 weeks of anticoagulation.   Hospital Course     Consultants: Structural Heart Team  [name] presented for outpatient cardioversion on 04/07/2018. Following cardioversion, patient developed severe hypertension that improved with IV phenylephrine. He also received IV fluids and his BP appeared to stabilized around 90/60 mmHg. His ECG showed sinus rhythm with minor non-specific ST/T changes and rightward axis. After trying to sit up in bed, patient developed severe dizziness, profound hypotension, and bradycardia with junctional rhythm of 40 bpm followed by nausea/vomiting and brief syncope. IV Atropine and IV Epinephrine were administered and he developed sinus tachycardia and hypertension. After Epinephrine effect wore off, he again developed hypotension with systolic BP in the 19'F. IV Dopmaine was started. JVP was noted to be markedly elevated. He quickly regained  consciousness. There was reportedly severe ST segment depression noted on telemetry during his period of hypotension. Subsequent troponin levels remained negative.  Echocardiogram showed LVEF >65% with severe mitral regurgitation, elevated left atrial pressure, and right ventricular volume/pressure overload. TEE on 04/08/2018 confirmed severe mitral regurgitation and the mitral valve was noted to be myxomatous. The Structural Heart Team was consulted and felt like patient's mitral regurgitation was of mixed etiology and recommended cardiac catheterization prior to discharge after IV diuresis. Patient underwent right/left cardiac catheterization on 04/12/2018 which showed severe 2-vessel CAD with total occlusion of the LAD supplied by the LIMA, severe stenosis of of the distal circumflex, and total occlusion of the first OM. Patient underwent successful PCI with DES to the circumflex lesion and DES to the OM lesion. Patient tolerated the procedure well. He has had no additional syncopal episodes and has been maintaining sinus rhythm. Murmur on exam has become less prominent and is quiet on exam today. - Will continue Aspirin 81mg  for 1 week, Plavix 75mg  daily for 6 months, and Xarelto 20mg  daily. - Unable to add heart failure regimen due to low BP. Will continue PO Lasix 20mg  daily and arrange for close outpatient follow-up.  Of note, last lipid panel from 02/2016 showed Cholesterol 154, Triglycerides 71, HDL 45, LDL 95. LDL goal <70 given CAD. Will add Lipitor 40mg  daily at discharge with plans to repeat lipid panel and CMP in 6 weeks.   Patient was seen and examined by Dr. Burt Knack today and determined to be stable for discharge. Outpatient follow-up has been arranged.  Medications as below.   _____________  Discharge Vitals Blood pressure 116/81, pulse 80, temperature 98.4 F (36.9 C), temperature source Oral, resp. rate 13, height 6' (1.829 m), weight 74 kg, SpO2 94 %.  Filed Weights   04/11/18 0313  04/12/18 0525 04/13/18 0315  Weight: 73.6 kg 73.7 kg 74 kg    Labs & Radiologic Studies    CBC Recent Labs    04/12/18 0346  04/12/18 1450 04/13/18 0304  WBC 6.5  --   --  5.8  HGB 15.1   < > 15.6 15.0  HCT 45.1   < > 46.0 46.1  MCV 89.0  --   --  88.8  PLT 212  --   --  212   < > = values in this interval not displayed.   Basic Metabolic Panel Recent Labs    04/12/18 0346  04/12/18 1450 04/13/18 0304  NA 137   < > 137 137  K 3.5   < > 3.7 3.7  CL 101  --   --  101  CO2 27  --   --  26  GLUCOSE 89  --   --  84  BUN 14  --   --  15  CREATININE 1.08  --   --  1.28*  CALCIUM 9.2  --   --  9.4   < > = values in this interval not displayed.   Liver Function Tests No results for input(s): AST, ALT, ALKPHOS, BILITOT, PROT, ALBUMIN in the last 72 hours. No results for input(s): LIPASE, AMYLASE in the last 72 hours. Cardiac Enzymes No results for input(s): CKTOTAL, CKMB, CKMBINDEX, TROPONINI in the last 72 hours. BNP Invalid input(s): POCBNP D-Dimer No results for input(s): DDIMER in the last 72 hours. Hemoglobin A1C No results for input(s): HGBA1C in the last 72 hours. Fasting Lipid Panel No results for input(s): CHOL, HDL, LDLCALC, TRIG, CHOLHDL, LDLDIRECT in the last 72 hours. Thyroid Function Tests No results for input(s): TSH, T4TOTAL, T3FREE, THYROIDAB in the last 72 hours.  Invalid input(s): FREET3 _____________  No results found. Disposition   Patient is being discharged home today in good condition.  Follow-up Plans & Appointments    Follow-up Information    [facility], [place] Follow up.   Specialties:  Cardiology, Radiology Why:  You have a hospital follow-up scheduled for 04/20/2018 at 10:00am. Please arrive 15 minutes early for check in.  Contact information: [address] [place] [address] [phone]          Discharge Instructions    Amb Referral to Cardiac Rehabilitation   Complete by:  As directed    Diagnosis:   Coronary Stents   Diet - low sodium heart healthy   Complete by:  As directed    Discharge patient   Complete by:  As directed    Two hours after procedure.   Discharge disposition:  01-Home or Self Care   Discharge patient date:  04/07/2018   Increase activity slowly   Complete by:  As directed       Discharge Medications   Allergies as of 04/13/2018      Reactions   Pollen Extract Other (See Comments)   sneezing      Medication List    STOP taking these medications   divalproex 250 MG DR tablet Commonly known as:  DEPAKOTE   metoprolol succinate 25 MG 24 hr tablet Commonly known as:  TOPROL XL   sertraline 50 MG tablet  Commonly known as:  ZOLOFT   topiramate 100 MG tablet Commonly known as:  TOPAMAX     TAKE these medications   albuterol 108 (90 Base) MCG/ACT inhaler Commonly known as:  PROVENTIL HFA;VENTOLIN HFA Inhale 2 puffs into the lungs every 6 (six) hours as needed for shortness of breath.   aspirin 81 MG chewable tablet Chew 1 tablet (81 mg total) by mouth daily for 7 days. Start taking on:  April 14, 2018   aspirin-acetaminophen-caffeine 250-250-65 MG tablet Commonly known as:  EXCEDRIN MIGRAINE Take 2 tablets by mouth daily as needed for migraine.   atorvastatin 40 MG tablet Commonly known as:  LIPITOR Take 1 tablet (40 mg total) by mouth daily.   clopidogrel 75 MG tablet Commonly known as:  PLAVIX Take 1 tablet (75 mg total) by mouth daily with breakfast. Start taking on:  April 14, 2018   Flaxseed Oil 1200 MG Caps Take 1,200 mg by mouth daily.   furosemide 20 MG tablet Commonly known as:  LASIX Take 1 tablet (20 mg total) by mouth daily. Start taking on:  April 14, 2018   gabapentin 300 MG capsule Commonly known as:  NEURONTIN Take 300 mg by mouth 2 (two) times daily. at dinner and at bedtime   Melatonin 5 MG Caps Take 5 mg by mouth at bedtime.   PRESERVISION AREDS 2 Caps Take 1 capsule by mouth every evening.     rivaroxaban 20 MG Tabs tablet Commonly known as:  XARELTO Take 1 tablet (20 mg total) by mouth daily with supper.   TURMERIC PO Take 1,950 mg by mouth every evening.          Outstanding Labs/Studies   Repeat lipid panel and CMP in 6 weeks.   Duration of Discharge Encounter   Greater than 30 minutes including physician time.  Signed, Darreld Mclean, PA-C 04/13/2018, 1:07 PM

## 2018-04-13 NOTE — Care Management Important Message (Signed)
Important Message  Patient Details  Name: Alan [name] MRN: [id number] Date of Birth: 04/18/1950   Medicare Important Message Given:  Yes    Madeeha Costantino P Analycia Khokhar 04/13/2018, 1:08 PM

## 2018-04-13 NOTE — Progress Notes (Signed)
Progress Note  Patient Name: Alan [name] Date of Encounter: 04/13/2018  Primary Cardiologist: Sanda Klein, MD   Subjective   Dizzy when walking with cardiac rehab this am. BP and HR noted to be normal. No other complaints. No CP or dyspnea.   Inpatient Medications    Scheduled Meds: . aspirin  81 mg Oral Daily  . clopidogrel  75 mg Oral Q breakfast  . furosemide  40 mg Oral Daily  . rivaroxaban  20 mg Oral Daily  . sodium chloride flush  3 mL Intravenous Q12H   Continuous Infusions: . sodium chloride     PRN Meds: sodium chloride, acetaminophen, ondansetron (ZOFRAN) IV, sodium chloride flush   Vital Signs    Vitals:   04/12/18 1915 04/12/18 2000 04/13/18 0315 04/13/18 0703  BP: 129/85  (!) 96/58 116/81  Pulse: 75 76 68 80  Resp: 18 15 16 13   Temp: (!) 97.5 F (36.4 C)  (!) 97.5 F (36.4 C) 98.4 F (36.9 C)  TempSrc: Oral  Oral Oral  SpO2: 98% 96% 94% 94%  Weight:   74 kg   Height:        Intake/Output Summary (Last 24 hours) at 04/13/2018 0824 Last data filed at 04/13/2018 0806 Gross per 24 hour  Intake 1703.3 ml  Output 1320 ml  Net 383.3 ml   Last 3 Weights 04/13/2018 04/12/2018 04/11/2018  Weight (lbs) 163 lb 2.3 oz 162 lb 8 oz 162 lb 4.1 oz  Weight (kg) 74 kg 73.71 kg 73.6 kg      Telemetry    Normal sinus, no AF - Personally Reviewed  ECG    NSR 73 bpm, nonspecific ST-T abnormality - Personally Reviewed  Physical Exam  Alert, oriented male in NAD GEN: No acute distress.   Neck: No JVD Cardiac: RRR, 1/6 systolic murmur at the LLSB Respiratory: Clear to auscultation bilaterally. GI: Soft, nontender, non-distended  MS: No edema; No deformity. Neuro:  Nonfocal  Psych: Normal affect   Labs    Chemistry Recent Labs  Lab 04/07/18 1606  04/11/18 0316 04/12/18 0346 04/12/18 1435 04/12/18 1450 04/13/18 0304  NA 141   < > 139 137 142 137 137  K 4.5   < > 3.5 3.5 3.5 3.7 3.7  CL 110   < > 101 101  --   --  101  CO2 23   < >  26 27  --   --  26  GLUCOSE 106*   < > 94 89  --   --  84  BUN 19   < > 18 14  --   --  15  CREATININE 1.49*   < > 1.12 1.08  --   --  1.28*  CALCIUM 8.2*   < > 9.6 9.2  --   --  9.4  PROT 5.0*  --   --   --   --   --   --   ALBUMIN 3.0*  --   --   --   --   --   --   AST 34  --   --   --   --   --   --   ALT 26  --   --   --   --   --   --   ALKPHOS 30*  --   --   --   --   --   --   BILITOT 1.2  --   --   --   --   --   --  GFRNONAA 47*   < > >60 >60  --   --  57*  GFRAA 55*   < > >60 >60  --   --  >60  ANIONGAP 8   < > 12 9  --   --  10   < > = values in this interval not displayed.     Hematology Recent Labs  Lab 04/11/18 0316 04/12/18 0346 04/12/18 1435 04/12/18 1450 04/13/18 0304  WBC 9.1 6.5  --   --  5.8  RBC 5.26 5.07  --   --  5.19  HGB 15.4 15.1 16.0 15.6 15.0  HCT 47.0 45.1 47.0 46.0 46.1  MCV 89.4 89.0  --   --  88.8  MCH 29.3 29.8  --   --  28.9  MCHC 32.8 33.5  --   --  32.5  RDW 13.3 13.4  --   --  13.2  PLT 226 212  --   --  212    Cardiac Enzymes Recent Labs  Lab 04/07/18 1606 04/07/18 2209 04/08/18 0318  TROPONINI <0.03 0.03* <0.03   No results for input(s): TROPIPOC in the last 168 hours.   BNPNo results for input(s): BNP, PROBNP in the last 168 hours.   DDimer No results for input(s): DDIMER in the last 168 hours.   Radiology    No results found.  Cardiac Studies   Echo 04/07/18: IMPRESSIONS  1. The left ventricle has hyperdynamic systolic function of >93%. The cavity size was normal. Left ventricular diastolic Doppler parameters are consistent with restrictive filling Elevated mean left atrial pressure There is right ventricular  volume/pressure overload. No evidence of left ventricular regional wall motion abnormalities. 2. The right ventricle has normal systolic function. The cavity was severely enlarged. There is no increase in right ventricular wall thickness. Right ventricular systolic pressure is mildly elevated with an  estimated pressure of 42.8 mmHg. 3. Left atrial size was severely dilated. 4. Right atrial size was severely dilated. 5. There is mild thickening. Mitral valve regurgitation is severe by color flow Doppler. The MR jet is centrally-directed. Mild increase in gradients in the range of mitral valve stenosis, due to severe regurgitation and previous repair. 6. The tricuspid valve is normal in structure. Tricuspid valve regurgitation is moderate-severe. 7. The inferior vena cava was dilated in size with <50% respiratory variability. 8. There is marked worsening of the mitral insufficiency compared to 2016. Recommend TEE for further evaluation. 9. Right atrial pressure is estimated at 15 mmHg.  TEE 04/08/18: IMPRESSIONS   1. The left ventricle has has normal systolic function, with an ejection fraction of 60-65%. The cavity size was normal. 2. The right ventricle has mildly reduced systolic function. The cavity was mildly enlarged. There is no increase in right ventricular wall thickness. 3. Left atrial size was moderately dilated. 4. Right atrial size was severely dilated. 5. The mitral valve is myxomatous. There is moderate thickening of the anterior leaflet. Mitral valve regurgitation is severe by color flow Doppler. PISA radius 0.8cm. 6. There is tethering of the inferior leaflet and malcoaptation. 7. The tricuspid valve was normal in structure. 8. The pulmonic valve was normal in structure.  Cath: Conclusion     Ost LAD lesion is 60% stenosed.  Prox LAD to Mid LAD lesion is 100% stenosed.  Ost LPDA to LPDA lesion is 90% stenosed.  A drug-eluting stent was successfully placed using a STENT SYNERGY DES 2.25X20.  Post intervention, there is a 0% residual stenosis.  Ost 1st Mrg to 1st Mrg lesion is 100% stenosed.  A drug-eluting stent was successfully placed using a STENT RESOLUTE ONYX 2.0X30.  Post intervention, there is a 0% residual stenosis.   1.  Severe  two-vessel coronary artery disease with total occlusion of the LAD supplied by the left internal mammary artery graft, severe stenosis of the distal circumflex treated successfully with PCI using a drug-eluting stent, and total occlusion of the first OM treated successfully with PCI using a drug-eluting stent 2.  Small nondominant RCA 3.  Normal right heart and left heart hemodynamics with compensated filling pressures and preserved cardiac output  Recommendations: Resume rivaroxaban tomorrow.  Continue clopidogrel for 6 months.  Aspirin 81 mg x 1 week.    Diagnostic  Dominance: Left    Intervention     Patient Profile     68 y.o. 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    Assessment & Plan    1. Severe MR: mixed disease with both primary MR and likely ischemic component. Clinically improved with excellent hemodynamics at cath yesterday. Murmur quiet on exam. Continue med Rx and close outpatient follow-up.   2. Severe CAD: successful PCI of the distal AV circumflex (dominant vessel) and first OM yesterday. Continue ASA x 1 week, clopidogrel 75 mg daily x 6 months, and rivaroxaban ongoing.  3. Persistent atrial fibrillation: maintaining sinus rhythm. Tele reviewed. Resume rivaroxaban today.  4. Acute diastolic CHF: post-cardioversion, transiently in shock requiring dopamine. BP low, but patient stable. Unable to add HF regimen secondary to low BP. Continue low dose loop diuretic and arrange clinical follow-up. LVEF vigorous.   Dispo: home later today if dizziness improved.   For questions or updates, please contact [facility] Please consult [email or web address] for contact info under         Signed, [name], MD  04/13/2018, 8:24 AM

## 2018-04-13 NOTE — Progress Notes (Signed)
CARDIAC REHAB PHASE I   PRE:  Rate/Rhythm: 80 SR  BP:  Sitting: 106/86      SaO2: 96 RA  MODE:  Ambulation: 36 ft   POST:  Rate/Rhythm: 90 SR  BP:  Sitting: 117/73    SaO2: 97 RA   Pt ambulated 15ft in hallway assist of one with gait belt. Pt c/o dizziness and unsteadiness. Helped pt return to bed, post-BP stable. Pt edcuated on importance of Plavix, ASA, and NTG. Stent card at bedside. Pt given heart healthy diet. Reviewed restrictions, encouraged ambulation as able with emphasis on safety. Will refer to CRP II [place], pt states he is uninterested in going though. Nursing staff will help pt ambulate later today.  [phone] Rufina Falco, RN BSN 04/13/2018 9:12 AM

## 2018-04-15 ENCOUNTER — Encounter (HOSPITAL_COMMUNITY): Payer: Self-pay | Admitting: Anesthesiology

## 2018-04-20 ENCOUNTER — Encounter: Payer: Self-pay | Admitting: Physician Assistant

## 2018-04-20 ENCOUNTER — Ambulatory Visit (INDEPENDENT_AMBULATORY_CARE_PROVIDER_SITE_OTHER): Payer: Medicare Other | Admitting: Physician Assistant

## 2018-04-20 VITALS — BP 122/76 | HR 77 | Ht 72.0 in | Wt 166.0 lb

## 2018-04-20 DIAGNOSIS — I25810 Atherosclerosis of coronary artery bypass graft(s) without angina pectoris: Secondary | ICD-10-CM | POA: Diagnosis not present

## 2018-04-20 DIAGNOSIS — Z952 Presence of prosthetic heart valve: Secondary | ICD-10-CM | POA: Diagnosis not present

## 2018-04-20 DIAGNOSIS — Z9889 Other specified postprocedural states: Secondary | ICD-10-CM

## 2018-04-20 DIAGNOSIS — I480 Paroxysmal atrial fibrillation: Secondary | ICD-10-CM | POA: Diagnosis not present

## 2018-04-20 NOTE — Patient Instructions (Addendum)
Medication Instructions:  STOP Aspirin today STOP Plavix on 10-11-2018  If you need a refill on your cardiac medications before your next appointment, please call your pharmacy.   Lab work:  NONE  If you have labs (blood work) drawn today and your tests are completely normal, you will receive your results only by: [facility] MyChart Message (if you have MyChart) OR . A paper copy in the mail If you have any lab test that is abnormal or we need to change your treatment, we will call you to review the results.  Testing/Procedures:  NONE  Follow-Up:  Your physician recommends that you schedule a follow-up appointment in 2-3 months with Dr. Sallyanne Kuster  Any Other Special Instructions Will Be Listed Below (If Applicable).

## 2018-04-20 NOTE — Progress Notes (Signed)
Cardiology Office Note    Date:  04/21/2018   ID:  Alan [name], DOB [birth year], MRN [id number]  PCP:  Camille Bal, PA-C  Cardiologist:  Dr. Sallyanne Kuster   Chief Complaint  Patient presents with  . Follow-up    seen for Dr. Sallyanne Kuster    History of Present Illness:  Alan [name] is a 68 y.o. male with PMH of prostate cancer, paroxysmal atrial fibrillation, history of mitral and aortic endocarditis in August 6761 complicated by severe aortic insufficiency and cerebral and the splenic embolic infarcts.  He underwent aortic replacement with a bioprosthesis and mitral valve repair along with single-vessel LIMA to LAD for incidentally discovered CAD.  He had postop atrial flutter and underwent cardioversion both in October and again in November 2016.  His systemic anticoagulation therapy was discontinued after that due to lack of recurrence.  Patient was last seen by Dr. Sallyanne Kuster on 03/17/2022 shortness of breath walking up a hill.  EKG demonstrated he went into atrial flutter with 2-1 heart block.  He was again placed on Eliquis and Toprol-XL was added to his medical regimen. Patient ultimately underwent successful DC cardioversion on 04/07/2018.  Following the cardioversion, he developed severe hypotension that improved with IV phenylephrine.  He also developed brief syncope as well.  IV atropine was added for sinus bradycardia.  On the telemetry, there appears to be transient severe ST depression following a period of severe hypotension. 2D echo obtained on 04/07/2018 showed EF greater than 65%, severely enlarged RV with mildly elevated RV SP at 42.8 mmHg, severe MR with centrally directed jet.  There was a question whether this could be ischemic MR.  Patient ultimately underwent cardiac catheterization on 04/12/2018 which showed a 60% ostial LAD, 100% proximal to LAD occlusion, 90% L PDA lesion treated with a 2.25 x 20 mm Synergy DES, 100% ostial OM1 occlusion treated with a 2.0 x  30 mm DES.  Normal right heart and left heart hemodynamic with compensated filling pressure.  It was felt that severe MR was likely a mixed ischemic and primary MR component.  Given clinical improvement with excellent hemodynamic, medical therapy was recommended with close outpatient follow-up.  Patient was discharged on aspirin, Plavix and Xarelto triple therapy with plan to continue aspirin for only 1 week and to continue Plavix for 6 months.  Patient presents today for follow-up.  He denies any further chest discomfort.  He has been compliant with his medication.  I instructed the patient to discontinue his aspirin after today.  He will also discontinue his Plavix starting on August 17 after completing a 46-month course.  He will be on long-term Xarelto therapy.  Overall he is recovering well and continue to ambulate daily.  He has no lower extremity edema, orthopnea or PND.   Past Medical History:  Diagnosis Date  . 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 Coronary artery disease involving native coronary artery without angina pectoris 10/23/2014  . 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 Enterococcal bacteremia 10/19/2014  . Hepatitis   . Memory difficulty 02/06/2017  . Migraine 09/12/2015  . 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 Paroxysmal atrial fibrillation ([place]) 10/19/2014  . Paroxysmal atrial flutter ([place]) 10/19/2014  . Prostate cancer ([place])   . Protein-calorie malnutrition, severe ([place])   . Restless leg syndrome   . 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/2014   23 mm [facility] Ease bovine  pericardial bioprosthetic tissue valve with bovine pericardial patch enlargement of aortic root  . 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 S/P mitral valve repair 11/01/2014   Debridement of vegetation on anterior leaflet  . 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 AORTIC ROOT ENLARGEMENT N/A 11/01/2014   Procedure: AORTIC ROOT ENLARGEMENT;  Surgeon: Rexene Alberts, MD;  Location: [place];  Service: Open Heart Surgery;  Laterality: N/A;  . AORTIC VALVE REPLACEMENT N/A 11/01/2014   Procedure: AORTIC VALVE REPLACEMENT (AVR);  Surgeon: Rexene Alberts, MD;  Location: [place];  Service: Open Heart Surgery;  Laterality: N/A;  . CARDIAC CATHETERIZATION N/A 10/23/2014   Procedure: Right/Left Heart Cath and Coronary Angiography;  Surgeon: Lorretta Harp, MD;  Location: [place] CV LAB;  Service: Cardiovascular;  Laterality: N/A;  . CARDIOVERSION N/A 12/18/2014   Procedure: CARDIOVERSION;  Surgeon: Pixie Casino, MD;  Location[facility] ENDOSCOPY;  Service: Cardiovascular;  Laterality: N/A;  . CARDIOVERSION N/A 02/06/2015   Procedure: CARDIOVERSION;  Surgeon: Larey Dresser, MD;  Location: [place];  Service: Cardiovascular;  Laterality: N/A;  . CARDIOVERSION N/A 04/07/2018   Procedure: CARDIOVERSION;  Surgeon: Sanda Klein, MD;  Location: [facility] ENDOSCOPY;  Service: Cardiovascular;  Laterality: N/A;  . COLONOSCOPY N/A 10/25/2014   Procedure: COLONOSCOPY;  Surgeon: Arta Silence, MD;  Location: [facility] ENDOSCOPY;  Service: Endoscopy;  Laterality: N/A;  . CORONARY ARTERY BYPASS GRAFT N/A 11/01/2014   Procedure: CORONARY ARTERY BYPASS GRAFTING (CABG) x 1 using internal mammary artery;  Surgeon: Rexene Alberts, MD;  Location: [place];  Service: Open Heart Surgery;  Laterality: N/A;  . CORONARY CTO INTERVENTION N/A 04/12/2018   Procedure: CORONARY CTO INTERVENTION;  Surgeon: [name], MD;  Location: [place] CV LAB;  Service: Cardiovascular;  Laterality: N/A;  . CORONARY STENT INTERVENTION N/A 04/12/2018   Procedure: CORONARY STENT INTERVENTION;  Surgeon: [name], MD;  Location: [place] CV LAB;  Service: Cardiovascular;  Laterality: N/A;  . MITRAL VALVE  REPAIR N/A 11/01/2014   Procedure: MITRAL VALVE REPAIR with no Ring;  Surgeon: Rexene Alberts, MD;  Location: [place];  Service: Open Heart Surgery;  Laterality: N/A;  . PROSTATECTOMY    . RIGHT/LEFT HEART CATH AND CORONARY/GRAFT ANGIOGRAPHY N/A 04/12/2018   Procedure: RIGHT/LEFT HEART CATH AND CORONARY/GRAFT ANGIOGRAPHY;  Surgeon: [name], MD;  Location: [place] CV LAB;  Service: Cardiovascular;  Laterality: N/A;  . TEE WITHOUT CARDIOVERSION N/A 10/20/2014   Procedure: TRANSESOPHAGEAL ECHOCARDIOGRAM (TEE);  Surgeon: Josue Hector, MD;  Location: [place];  Service: Cardiovascular;  Laterality: N/A;  . TEE WITHOUT CARDIOVERSION N/A 11/01/2014   Procedure: TRANSESOPHAGEAL ECHOCARDIOGRAM (TEE);  Surgeon: Rexene Alberts, MD;  Location: [place];  Service: Open Heart Surgery;  Laterality: N/A;  . TEE WITHOUT CARDIOVERSION N/A 04/08/2018   Procedure: TRANSESOPHAGEAL ECHOCARDIOGRAM (TEE);  Surgeon: Jerline Pain, MD;  Location: [facility] ENDOSCOPY;  Service: Cardiovascular;  Laterality: N/A;    Current Medications: Outpatient Medications Prior to Visit  Medication Sig Dispense Refill  . albuterol (PROVENTIL HFA;VENTOLIN HFA) 108 (90 Base) MCG/ACT inhaler Inhale 2 puffs into the lungs every 6 (six) hours as needed for shortness of breath.  0  . aspirin 81 MG chewable tablet Chew 1 tablet (81 mg total) by mouth daily for 7 days.    Marland Kitchen aspirin-acetaminophen-caffeine (EXCEDRIN MIGRAINE) 250-250-65 MG tablet Take  2 tablets by mouth daily as needed for migraine.    Marland Kitchen atorvastatin (LIPITOR) 40 MG tablet Take 1 tablet (40 mg total) by mouth daily. 30 tablet 2  . clopidogrel (PLAVIX) 75 MG tablet Take 1 tablet (75 mg total) by mouth daily with breakfast. 30 tablet 5  . Flaxseed, Linseed, (FLAXSEED OIL) 1200 MG CAPS Take 1,200 mg by mouth daily.    . furosemide (LASIX) 20 MG tablet Take 1 tablet (20 mg total) by mouth daily. 30 tablet 2  . gabapentin (NEURONTIN) 300 MG capsule Take 300 mg by mouth 2 (two)  times daily. at dinner and at bedtime    . Melatonin 5 MG CAPS Take 5 mg by mouth at bedtime.    . Multiple Vitamins-Minerals (PRESERVISION AREDS 2) CAPS Take 1 capsule by mouth every evening.     . rivaroxaban (XARELTO) 20 MG TABS tablet Take 1 tablet (20 mg total) by mouth daily with supper. 30 tablet 5  . TURMERIC PO Take 1,950 mg by mouth every evening.     No facility-administered medications prior to visit.      Allergies:   Pollen extract   Social History   Socioeconomic History  . Marital status: Married    Spouse name: Not on file  . Number of children: 1  . Years of education: Master's  . Highest education level: Not on file  Occupational History  . Occupation: Economist: [employer]  . Financial resource strain: Not on file  . Food insecurity:    Worry: Not on file    Inability: Not on file  . Transportation needs:    Medical: Not on file    Non-medical: Not on file  Tobacco Use  . Smoking status: Former Research scientist (life sciences)  . Smokeless tobacco: Never Used  . Tobacco comment: Smoked a pipe age 74->28  Substance and Sexual Activity  . Alcohol use: No    Alcohol/week: 0.0 standard drinks    Comment: Drank from age 5->38  . Drug use: No  . Sexual activity: Not on file  Lifestyle  . Physical activity:    Days per week: Not on file    Minutes per session: Not on file  . Stress: Not on file  Relationships  . Social connections:    Talks on phone: Not on file    Gets together: Not on file    Attends religious service: Not on file    Active member of club or organization: Not on file    Attends meetings of clubs or organizations: Not on file    Relationship status: Not on file  Other Topics Concern  . Not on file  Social History Narrative   Lives at home w/ his wife   Right-handed   Caffeine: 2 cups daily     Family History:  The patient's family history includes Stroke in his father.   ROS:   Please see the history of present illness.      ROS All other systems reviewed and are negative.   PHYSICAL EXAM:   VS:  BP 122/76   Pulse 77   Ht 6' (1.829 m)   Wt 166 lb (75.3 kg)   BMI 22.51 kg/m    GEN: Well nourished, well developed, in no acute distress  HEENT: normal  Neck: no JVD, carotid bruits, or masses Cardiac: RRR; no murmurs, rubs, or gallops,no edema  Respiratory:  clear to auscultation bilaterally, normal work of breathing GI: soft, nontender,  nondistended, + BS MS: no deformity or atrophy  Skin: warm and dry, no rash Neuro:  Alert and Oriented x 3, Strength and sensation are intact Psych: euthymic mood, full affect  Wt Readings from Last 3 Encounters:  04/20/18 166 lb (75.3 kg)  04/13/18 163 lb 2.3 oz (74 kg)  03/17/18 173 lb (78.5 kg)      Studies/Labs Reviewed:   EKG:  EKG is ordered today.  The ekg ordered today demonstrates normal sinus rhythm, poor R wave progression anterior leads, T wave inversion in inferior leads.  Recent Labs: 04/07/2018: ALT 26; Magnesium 1.7 04/13/2018: BUN 15; Creatinine, Ser 1.28; Hemoglobin 15.0; Platelets 212; Potassium 3.7; Sodium 137   Lipid Panel    Component Value Date/Time   CHOL 154 03/06/2016 0954   TRIG 71 03/06/2016 0954   HDL 45 03/06/2016 0954   CHOLHDL 3.4 03/06/2016 0954   VLDL 14 03/06/2016 0954   LDLCALC 95 03/06/2016 0954    Additional studies/ records that were reviewed today include:   Echo 01/23/15 - Left ventricle: The cavity size was normal. There was severe concentric hypertrophy. Systolic function was normal. Theestimated ejection fraction was in the range of 60% to 65%. Wallmotion was normal; there were no regional wall motionabnormalities. The study is not technically sufficient to allowevaluation of LV diastolic function. - Aortic valve: A bioprosthesis was present. The sewing ringappeared normal. Transvalvular velocity was within the normalrange. There was no stenosis. Peak velocity (S): 197 cm/s. Meangradient (S): 8 mm Hg. -  Mitral valve: Prior procedures included surgical repair, thoughno surgical ring is appreciated. The findings are consistent withmild stenosis. There was mild regurgitation. Mean gradient (D): 66mm Hg. - Left atrium: The atrium was severely dilated. - Right ventricle: The cavity size was normal. Wall thickness wasnormal. Systolic function was moderately reduced. - Right atrium: The atrium was severely dilated. - Tricuspid valve: There was mild regurgitation. - Pulmonary arteries: PA peak pressure: 37 mm Hg (S).  CATH 10/23/2014  Ost LAD lesion, 60% stenosed.  Prox LAD to Mid LAD lesion, 70% stenosed.  Ost LPDA to LPDA lesion, 90% stenosed.  Ost 2nd Mrg to 2nd Mrg lesion, 70% stenosed.  2nd Mrg lesion, 50% stenosed.   Echo 04/07/2018 IMPRESSIONS    1. The left ventricle has hyperdynamic systolic function of >32%. The cavity size was normal. Left ventricular diastolic Doppler parameters are consistent with restrictive filling Elevated mean left atrial pressure There is right ventricular  volume/pressure overload. No evidence of left ventricular regional wall motion abnormalities.  2. The right ventricle has normal systolic function. The cavity was severely enlarged. There is no increase in right ventricular wall thickness. Right ventricular systolic pressure is mildly elevated with an estimated pressure of 42.8 mmHg.  3. Left atrial size was severely dilated.  4. Right atrial size was severely dilated.  5. There is mild thickening. Mitral valve regurgitation is severe by color flow Doppler. The MR jet is centrally-directed. Mild increase in gradients in the range of mitral valve stenosis, due to severe regurgitation and previous repair.  6. The tricuspid valve is normal in structure. Tricuspid valve regurgitation is moderate-severe.  7. The inferior vena cava was dilated in size with <50% respiratory variability.  8. There is marked worsening of the mitral insufficiency compared to  2016. Recommend TEE for further evaluation.  9. Right atrial pressure is estimated at 15 mmHg.   Cath 04/12/2018  Ost LAD lesion is 60% stenosed.  Prox LAD to Mid LAD lesion is 100%  stenosed.  Ost LPDA to LPDA lesion is 90% stenosed.  A drug-eluting stent was successfully placed using a STENT SYNERGY DES 2.25X20.  Post intervention, there is a 0% residual stenosis.  Ost 1st Mrg to 1st Mrg lesion is 100% stenosed.  A drug-eluting stent was successfully placed using a STENT RESOLUTE ONYX 2.0X30.  Post intervention, there is a 0% residual stenosis.   1.  Severe two-vessel coronary artery disease with total occlusion of the LAD supplied by the left internal mammary artery graft, severe stenosis of the distal circumflex treated successfully with PCI using a drug-eluting stent, and total occlusion of the first OM treated successfully with PCI using a drug-eluting stent 2.  Small nondominant RCA 3.  Normal right heart and left heart hemodynamics with compensated filling pressures and preserved cardiac output  Recommendations: Resume rivaroxaban tomorrow.  Continue clopidogrel for 6 months.  Aspirin 81 mg x 1 week.   ASSESSMENT:    1. Coronary artery disease involving coronary bypass graft of native heart without angina pectoris   2. Paroxysmal atrial fibrillation (HCC)   3. S/P AVR   4. H/O mitral valve repair      PLAN:  In order of problems listed above:  1. CAD s/p CABG: Recently underwent cardiac catheterization on 04/12/2018.  He underwent DES to RPDA and OM1.  He has not had any further chest discomfort.  He is currently on triple therapy including aspirin, Plavix and Xarelto.  He has completed a one-week course of aspirin and will discontinue aspirin after today.  He will complete a 4-month course of Plavix and stop Plavix on 10/11/2018.  2. History of mitral valve repair and AVR: Aortic valve stable on most recent echocardiogram.  Although he had severe mitral regurgitation  noted on echocardiogram, however it was felt underlying cause was related to ischemia.  The left and right heart cath revealed stable hemodynamic.  After cardiac catheterization, he no longer has a mitral valve heart murmur.  We will continue to pursue medical management for this.  Consider repeat echocardiogram in 6 to 12 months.  3. Paroxysmal atrial fibrillation: Currently maintaining sinus rhythm.  Continue Xarelto long-term    Medication Adjustments/Labs and Tests Ordered: Current medicines are reviewed at length with the patient today.  Concerns regarding medicines are outlined above.  Medication changes, Labs and Tests ordered today are listed in the Patient Instructions below. Patient Instructions  Medication Instructions:  STOP Aspirin today STOP Plavix on 10-11-2018  If you need a refill on your cardiac medications before your next appointment, please call your pharmacy.   Lab work:  NONE  If you have labs (blood work) drawn today and your tests are completely normal, you will receive your results only by: Marland Kitchen MyChart Message (if you have MyChart) OR . A paper copy in the mail If you have any lab test that is abnormal or we need to change your treatment, we will call you to review the results.  Testing/Procedures:  NONE  Follow-Up:  Your physician recommends that you schedule a follow-up appointment in 2-3 months with Dr. Sallyanne Kuster  Any Other Special Instructions Will Be Listed Below (If Applicable).       Hilbert Corrigan, [place]  04/21/2018 2:46 PM    [facility] [address], [place], [place]  [address] Phone: [phone]; Fax: [phone]

## 2018-04-21 ENCOUNTER — Telehealth: Payer: Self-pay | Admitting: Cardiovascular Disease

## 2018-04-21 ENCOUNTER — Encounter: Payer: Self-pay | Admitting: Physician Assistant

## 2018-04-21 NOTE — Telephone Encounter (Signed)
Patient states that last night he took a whole tablet of Unisome instead of a half a tablet, as well as starting back taking Pramipexole, patient states after that he woke up this morning feeling very weak, and vomiting. Patient is fine now, no cardiac symptoms. I advised patient to contact his PCP office regarding the issues with these medication as they were prescribed by PCP.  Patient verbalized understanding, was thankful for the call.

## 2018-04-21 NOTE — Telephone Encounter (Signed)
New message   Pt c/o medication issue:  1. Name of [place]   2. How are you currently taking this medication (dosage and times per day)? 1 tablet as needed   3. Are you having a reaction (difficulty breathing--STAT)? No   4. What is your medication issue? Patient states that he vomited, weakness in arms he believes it is from this medication. Please call the patient to discuss.

## 2018-05-19 ENCOUNTER — Ambulatory Visit: Payer: Medicare Other | Admitting: Physician Assistant

## 2018-06-24 ENCOUNTER — Telehealth: Payer: Self-pay | Admitting: Cardiovascular Disease

## 2018-06-25 ENCOUNTER — Telehealth (INDEPENDENT_AMBULATORY_CARE_PROVIDER_SITE_OTHER): Payer: Medicare Other | Admitting: Cardiovascular Disease

## 2018-06-25 VITALS — Ht 72.0 in | Wt 165.0 lb

## 2018-06-25 DIAGNOSIS — F418 Other specified anxiety disorders: Secondary | ICD-10-CM

## 2018-06-25 DIAGNOSIS — I340 Nonrheumatic mitral (valve) insufficiency: Secondary | ICD-10-CM | POA: Diagnosis not present

## 2018-06-25 DIAGNOSIS — Z951 Presence of aortocoronary bypass graft: Secondary | ICD-10-CM

## 2018-06-25 DIAGNOSIS — Z953 Presence of xenogenic heart valve: Secondary | ICD-10-CM

## 2018-06-25 DIAGNOSIS — I484 Atypical atrial flutter: Secondary | ICD-10-CM | POA: Diagnosis not present

## 2018-06-25 DIAGNOSIS — I2510 Atherosclerotic heart disease of native coronary artery without angina pectoris: Secondary | ICD-10-CM

## 2018-06-25 DIAGNOSIS — Z9889 Other specified postprocedural states: Secondary | ICD-10-CM

## 2018-06-25 DIAGNOSIS — Z7901 Long term (current) use of anticoagulants: Secondary | ICD-10-CM

## 2018-06-25 NOTE — Progress Notes (Signed)
Virtual Visit via Video Note   This visit type was conducted due to national recommendations for restrictions regarding the COVID-19 Pandemic (e.g. social distancing) in an effort to limit this patient's exposure and mitigate transmission in our community.  Due to his co-morbid illnesses, this patient is at least at moderate risk for complications without adequate follow up.  This format is felt to be most appropriate for this patient at this time.  All issues noted in this document were discussed and addressed.  A limited physical exam was performed with this format.  Please refer to the patient's chart for his consent to telehealth for [facility].   Date:  06/25/2018   ID:  Alan [name], DOB 1950/06/28, MRN [id number]  Patient Location: Home Provider Location: Home  PCP:  Camille Bal, PA-C  Cardiologist:  Sanda Klein, MD  Electrophysiologist:  None   Evaluation Performed:  Follow-Up Visit  Chief Complaint:  Afib, MR, CAD  History of Present Illness:    Alan [name] is a 68 y.o. male with multiple and complex cardiac problems.  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  He underwent aortic valve replacement with a biological prosthesis and mitral valve repair as well as a single-vessel LIMA to LAD bypass for incidentally discovered asymptomatic CAD.  On November 01, 2014, he underwent AVR with a bioprosthesis (23 mm MagnaEase),patch enlargement of the aortic root (Nicks' technique) and MV repair without annuloplasty ring, as well as single-vessel LIMA to LAD bypass for incidentally discovered CAD and clipping of the left atrial appendage (40 mm Atricure clip). Postop atrial flutter led to treatment with amiodarone and cardioversion on October 24 and a second cardioversion on January 11, 2015.  As the arrhythmia did not recur  anticoagulants were then discontinued. He had normal left ventricular systolic  function.  He developed shortness of breath and was found to be in atrial flutter with 2: 1 AV block at a rate of 120 bpm in January 2020.  Echo showed preserved left ventricular systolic function and only mild to moderate mitral insufficiency.  Anticoagulation was started and he presented for elective cardioversion on April 07, 2018, with successful return to normal rhythm, but complicated by severe and persistent hypotension requiring hospitalization.  TEE demonstrated severe mitral insufficiency, With preserved left ventricular systolic function and no regional wall motion abnormalities.  Cardiac catheterization showed progression of coronary disease with occlusion of the LAD upstream of the LIMA bypass as well as high-grade stenoses in the distal left circumflex and oblique marginal artery. Both those lesions were treated with drug-eluting stents (bothe Resolute onyx 2.0 x 30).  Following revascularization was noted that his mitral regurgitation murmur diminished substantially.  He has not had repeat echocardiography yet.  Right heart catheterization showed compensated hemodynamics and normal cardiac output.  He feels well.  He is able to do housework and yard work without restriction.  He has been mowing his lawn with a push mower.  He is taking both Xarelto and Plavix and has not had any bleeding problems.  He does find that he has to take naps in the afternoon due to fatigue.  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  The patient does not have symptoms concerning for COVID-19 infection (fever, chills, cough, or new shortness of breath).    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  Aortic valve insufficiency, severe, 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/2014   Coronary artery disease involving native coronary artery without angina pectoris 10/23/2014   Endocarditis of mitral valve - Enterococcus  10/19/2014   Enterococcal bacteremia 10/19/2014   Hepatitis    Memory difficulty 02/06/2017   Migraine 09/12/2015   Mitral valve regurgitation, 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/2014   Paroxysmal atrial fibrillation ([place]) 10/19/2014   Paroxysmal atrial flutter ([place]) 10/19/2014   Prostate cancer ([place])    Protein-calorie malnutrition, severe (HCC)    Restless leg syndrome    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/2014   23 mm [facility] Ease bovine pericardial bioprosthetic tissue valve with bovine pericardial patch enlargement of aortic root   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   LIMA to LAD   S/P mitral valve repair 11/01/2014   Debridement of vegetation on anterior leaflet   Septic embolism ([place]) 10/20/2014   brain and spleen, subclinical   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   Past Surgical History:  Procedure Laterality Date   AORTIC ROOT ENLARGEMENT N/A 11/01/2014   Procedure: AORTIC ROOT ENLARGEMENT;  Surgeon: Rexene Alberts, MD;  Location: [place];  Service: Open Heart Surgery;  Laterality: N/A;   AORTIC VALVE REPLACEMENT N/A 11/01/2014   Procedure: AORTIC VALVE REPLACEMENT (AVR);  Surgeon: Rexene Alberts, MD;  Location: [place];  Service: Open Heart Surgery;  Laterality: N/A;   CARDIAC CATHETERIZATION N/A 10/23/2014   Procedure: Right/Left Heart Cath and Coronary Angiography;  Surgeon: Lorretta Harp, MD;  Location: [place] CV LAB;  Service: Cardiovascular;  Laterality: N/A;   CARDIOVERSION N/A 12/18/2014   Procedure: CARDIOVERSION;  Surgeon: Pixie Casino, MD;  Location: [facility] ENDOSCOPY;  Service: Cardiovascular;  Laterality: N/A;   CARDIOVERSION N/A 02/06/2015   Procedure: CARDIOVERSION;  Surgeon: Larey Dresser, MD;  Location: [place];  Service: Cardiovascular;  Laterality: N/A;   CARDIOVERSION N/A 04/07/2018   Procedure: CARDIOVERSION;  Surgeon: Sanda Klein, MD;  Location: [place] ENDOSCOPY;  Service: Cardiovascular;  Laterality: N/A;   COLONOSCOPY N/A 10/25/2014    Procedure: COLONOSCOPY;  Surgeon: Arta Silence, MD;  Location: [facility] ENDOSCOPY;  Service: Endoscopy;  Laterality: N/A;   CORONARY ARTERY BYPASS GRAFT N/A 11/01/2014   Procedure: CORONARY ARTERY BYPASS GRAFTING (CABG) x 1 using internal mammary artery;  Surgeon: Rexene Alberts, MD;  Location: [place];  Service: Open Heart Surgery;  Laterality: N/A;   CORONARY CTO INTERVENTION N/A 04/12/2018   Procedure: CORONARY CTO INTERVENTION;  Surgeon: [name], MD;  Location: [place] CV LAB;  Service: Cardiovascular;  Laterality: N/A;   CORONARY STENT INTERVENTION N/A 04/12/2018   Procedure: CORONARY STENT INTERVENTION;  Surgeon: [name], MD;  Location: [place] CV LAB;  Service: Cardiovascular;  Laterality: N/A;   MITRAL VALVE REPAIR N/A 11/01/2014   Procedure: MITRAL VALVE REPAIR with no Ring;  Surgeon: Rexene Alberts, MD;  Location: [place];  Service: Open Heart Surgery;  Laterality: N/A;   PROSTATECTOMY     RIGHT/LEFT HEART CATH AND CORONARY/GRAFT ANGIOGRAPHY N/A 04/12/2018   Procedure: RIGHT/LEFT HEART CATH AND CORONARY/GRAFT ANGIOGRAPHY;  Surgeon: [name], MD;  Location: [place] CV LAB;  Service: Cardiovascular;  Laterality: N/A;   TEE WITHOUT CARDIOVERSION N/A 10/20/2014   Procedure: TRANSESOPHAGEAL ECHOCARDIOGRAM (TEE);  Surgeon: Josue Hector, MD;  Location: [place];  Service: Cardiovascular;  Laterality: N/A;   TEE WITHOUT CARDIOVERSION N/A 11/01/2014   Procedure: TRANSESOPHAGEAL ECHOCARDIOGRAM (TEE);  Surgeon: Rexene Alberts, MD;  Location: [place];  Service: Open Heart Surgery;  Laterality: N/A;  TEE WITHOUT CARDIOVERSION N/A 04/08/2018   Procedure: TRANSESOPHAGEAL ECHOCARDIOGRAM (TEE);  Surgeon: Jerline Pain, MD;  Location: [facility] ENDOSCOPY;  Service: Cardiovascular;  Laterality: N/A;     Current Meds  Medication Sig   albuterol (PROVENTIL HFA;VENTOLIN HFA) 108 (90 Base) MCG/ACT inhaler Inhale 2 puffs into the lungs every 6 (six) hours as needed for shortness  of breath.   aspirin-acetaminophen-caffeine (EXCEDRIN MIGRAINE) 250-250-65 MG tablet Take 2 tablets by mouth daily as needed for migraine.   atorvastatin (LIPITOR) 40 MG tablet Take 1 tablet (40 mg total) by mouth daily.   clopidogrel (PLAVIX) 75 MG tablet Take 1 tablet (75 mg total) by mouth daily with breakfast.   Flaxseed, Linseed, (FLAXSEED OIL) 1200 MG CAPS Take 1,200 mg by mouth daily.   furosemide (LASIX) 20 MG tablet Take 1 tablet (20 mg total) by mouth daily.   gabapentin (NEURONTIN) 300 MG capsule Take 300 mg by mouth 2 (two) times daily. at dinner and at bedtime   Melatonin 5 MG CAPS Take 5 mg by mouth at bedtime.   Multiple Vitamins-Minerals (PRESERVISION AREDS 2) CAPS Take 1 capsule by mouth every evening.    rivaroxaban (XARELTO) 20 MG TABS tablet Take 1 tablet (20 mg total) by mouth daily with supper.   TURMERIC PO Take 1,950 mg by mouth every evening.     Allergies:   Pollen extract   Social History   Tobacco Use   Smoking status: Former Smoker   Smokeless tobacco: Never Used   Tobacco comment: Smoked a pipe age 50->28  Substance Use Topics   Alcohol use: No    Alcohol/week: 0.0 standard drinks    Comment: Drank from age 63->38   Drug use: No     Family Hx: The patient's family history includes Stroke in his father.  ROS:   Please see the history of present illness.    He denies orthopnea, PND, leg edema, claudication, new focal neurological complaints.  His mood is fairly positive. All other systems reviewed and are negative.   Prior CV studies:   The following studies were reviewed today:  2 transthoracic echoes in the TEE from January-February 2020.  Coronary angiography images from February 2020  Labs/Other Tests and Data Reviewed:    EKG:  An ECG dated April 20, 2018 was personally reviewed today and demonstrated:  Sinus rhythm, poor R wave progression in the precordial leads, T wave inversion in the inferior leads, normal QTC 439  ms  Recent Labs: 04/07/2018: ALT 26; Magnesium 1.7 04/13/2018: BUN 15; Creatinine, Ser 1.28; Hemoglobin 15.0; Platelets 212; Potassium 3.7; Sodium 137   Recent Lipid Panel Lab Results  Component Value Date/Time   CHOL 154 03/06/2016 09:54 AM   TRIG 71 03/06/2016 09:54 AM   HDL 45 03/06/2016 09:54 AM   CHOLHDL 3.4 03/06/2016 09:54 AM   LDLCALC 95 03/06/2016 09:54 AM    Wt Readings from Last 3 Encounters:  06/25/18 165 lb (74.8 kg)  04/20/18 166 lb (75.3 kg)  04/13/18 163 lb 2.3 oz (74 kg)     Objective:    Vital Signs:  Ht 6' (1.829 m)    Wt 165 lb (74.8 kg)    BMI 22.38 kg/m  His blood pressure cuff is faulty.  VITAL SIGNS:  reviewed GEN:  no acute distress EYES:  sclerae anicteric, EOMI - Extraocular Movements Intact RESPIRATORY:  normal respiratory effort, symmetric expansion CARDIOVASCULAR:  no peripheral edema SKIN:  no rash, lesions or ulcers. MUSCULOSKELETAL:  no obvious deformities. NEURO:  alert and oriented x 3, no obvious focal deficit PSYCH:  normal affect  ASSESSMENT & PLAN:    1. CAD: Had angina pectoris despite 2 high-grade stenoses that appear to be hemodynamically important.  His presentation was with exertional dyspnea and atrial arrhythmia.  Currently remains asymptomatic.  We will continue clopidogrel through October 11, 2018. 2. CHF: NYHA functional class I at this time.  Not requiring adjustments in very low dose diuretics.  Preserved left ventricular systolic function, decompensation appears to be secondary to severe acute mitral insufficiency. 3. MR: Although we have not repeated his echocardiogram and I cannot perform a physical exam today, all indications are that his mitral insufficiency improved following the revascularization procedure.  We will need to reassess by echo.  Retrospectively, it appears most likely that he had ischemic mitral regurgitation that led to his symptoms of heart failure and the arrhythmia. 4. HLP: On a potent statin.  Will  need to repeat his lipid profile when he comes in in August.  Target LDL less than 70. 5. AFlutter: Has not had clinically apparent recurrence of arrhythmia.  He did have his left atrial appendage clipped at the time of his surgery in 2016.  Unless there is recurrent arrhythmia in the next few months, we will plan to discontinue the Xarelto in the long run. 6. AVR and MV repair: Following enterococcal endocarditis in 2016.  Normal aortic prosthesis function at recent echo and transesophageal echo.  Superimposed acute mitral insufficiency probably had an ischemic mechanism. 7. Depression: His mood appears to be very well balanced at this time.  He has tried to support his wife who is having a lot of trouble dealing with concerns regarding the COVID-19 pandemic.  COVID-19 Education: The signs and symptoms of COVID-19 were discussed with the patient and how to seek care for testing (follow up with PCP or arrange E-visit).  The importance of social distancing was discussed today.  Time:   Today, I have spent 26 minutes with the patient with telehealth technology discussing the above problems.     Medication Adjustments/Labs and Tests Ordered: Current medicines are reviewed at length with the patient today.  Concerns regarding medicines are outlined above.   Tests Ordered: Orders Placed This Encounter  Procedures   ECHOCARDIOGRAM COMPLETE    Medication Changes: No orders of the defined types were placed in this encounter.   Disposition:  Follow up 3-4 months  Signed, Sanda Klein, MD  06/25/2018 3:25 PM    [place]

## 2018-06-25 NOTE — Patient Instructions (Signed)
Medication Instructions:  Continue same medications If you need a refill on your cardiac medications before your next appointment, please call your pharmacy.   Lab work: None ordered   Testing/Procedures: Echo to be scheduled in August    Scheduler will be calling to schedule  Follow-Up: At [facility], you and your health needs are our priority.  As part of our continuing mission to provide you with exceptional heart care, we have created designated Provider Care Teams.  These Care Teams include your primary Cardiologist (physician) and Advanced Practice Providers (APPs -  Physician Assistants and Nurse Practitioners) who all work together to provide you with the care you need, when you need it. . Schedule follow up appointment with Dr.Croitoru in August after Echo  Scheduler will call to schedule

## 2018-07-08 ENCOUNTER — Other Ambulatory Visit: Payer: Self-pay | Admitting: *Deleted

## 2018-07-08 MED ORDER — FUROSEMIDE 20 MG PO TABS: 20.0000 mg | ORAL_TABLET | Freq: Every day | ORAL | 11 refills | Status: DC

## 2018-07-08 MED ORDER — ATORVASTATIN CALCIUM 40 MG PO TABS: 40.0000 mg | ORAL_TABLET | Freq: Every day | ORAL | 2 refills | Status: DC

## 2018-08-11 ENCOUNTER — Telehealth: Payer: Self-pay | Admitting: Cardiovascular Disease

## 2018-08-11 NOTE — Telephone Encounter (Signed)
New Message               Patient  is calling to ask someone to put a note in his chart about a questionable stroke, per the patient his daughter she seems to think that is what happened.

## 2018-08-11 NOTE — Telephone Encounter (Signed)
Left a message for the patient to call back.  

## 2018-08-11 NOTE — Telephone Encounter (Signed)
The patient called stating that his daughter wanted him to call to let the office know that he was having occasional migraines and some memory loss. He contributes the memory loss to age. He denies any other symptoms.   He does have a history of stroke. He does not currently have a neurologist due to finances. He was asked if he would like to move up his appointment with Dr. Sallyanne Kuster from 8/13 and he refused stating that he did not need an appointment. He felt fine other than the occasional headache. He stated that he was just calling because his daughter wanted him too.   He has been advised that if he develops slurred speech, headaches do not get better, arm weakness, or facial drooping that he should seek immediate help. He has verbalized his understanding.

## 2018-08-11 NOTE — Telephone Encounter (Signed)
New Message             Patient is calling to have someone put a note in his chart

## 2018-08-19 ENCOUNTER — Telehealth: Payer: Self-pay

## 2018-08-19 NOTE — Telephone Encounter (Signed)
Received medical clearance for dental treatment.  May include: Cleaning, Radiographs, Fillings/Crowns/Bridges, Extraction, Root Canal Therapy, Local Anesthetic  Please advise of any special considerations:  --Antibiotic Prophylaxis?  --Interruption of anticoagulants? ----How long before and after treatment?  --Anesthetic restrictions? ----Is Epinephrine ok?  --Type of antibiotic allowed/recommended?  --Type of pain medication allowed/recommended?   [facility]  Dr. Randolm Idol, DDS Phone: [phone] Fax: [phone]

## 2018-08-20 NOTE — Telephone Encounter (Signed)
Agree with Isaac Laud comments. Will need more detailed information about procedure before providing clearance to hold anticoagulation. Patient will need SBE prophylaxis before all dental procedures.

## 2018-08-20 NOTE — Telephone Encounter (Signed)
[name] has a bioprosthetic aortic valve, therefore will need SBE prophylaxis with antibiotic.   Since he requires 6 month of antiplatelet, the earliest day he can hold plavix would be August 17th. Although for simple 1-2 teeth extraction, we typically do not recommend holding plavix or Xarelto. More complex procedure may need to hold plavix and Xarelto. Specific type of procedure, number of teeth being extracted and type of anesthesia/sedation will need to be known prior to clearance. I will forward to Dr. Sallyanne Kuster and our clinical pharmacist to review.   Will defer to Dr. Sallyanne Kuster to determine if epinephrine and pain medication during dental procedure is safe in this patient.

## 2018-08-21 ENCOUNTER — Encounter: Payer: Self-pay | Admitting: Cardiovascular Disease

## 2018-08-21 NOTE — Telephone Encounter (Signed)
Letter in epic, please fax to Dr. Adair Laundry

## 2018-08-23 NOTE — Telephone Encounter (Signed)
Letter has been faxed to [phone]

## 2018-08-23 NOTE — Telephone Encounter (Signed)
Lovena Le, can you please check with the office to make sure they received Dr. Victorino December letter?  Thanks Angie

## 2018-08-23 NOTE — Telephone Encounter (Signed)
LM with Dr. Sharin Grave office asking to let us know if they received letter and also that pt will need SBE prophylaxis prior to all dental work.

## 2018-08-26 NOTE — Telephone Encounter (Signed)
Open n error °

## 2018-09-08 ENCOUNTER — Other Ambulatory Visit: Payer: Self-pay

## 2018-09-08 MED ORDER — RIVAROXABAN 20 MG PO TABS: 20.0000 mg | ORAL_TABLET | Freq: Every day | ORAL | 5 refills | Status: DC

## 2018-09-08 NOTE — Telephone Encounter (Signed)
Pt requesting xarelto 20mg ... 68yom, scr 1.28 (04/13/18), 75.3kg, ccr 69ml/min, lovw/meng (04/20/18) will refill

## 2018-09-27 ENCOUNTER — Other Ambulatory Visit (HOSPITAL_COMMUNITY): Payer: Medicare Other

## 2018-10-04 ENCOUNTER — Telehealth: Payer: Self-pay | Admitting: Cardiovascular Disease

## 2018-10-04 NOTE — Telephone Encounter (Signed)
Left a message for the patient to call back.  

## 2018-10-04 NOTE — Telephone Encounter (Signed)
New Message     Pt c/o of Chest Pain: STAT if CP now or developed within 24 hours  1. Are you having CP right now? NO  2. Are you experiencing any other symptoms (ex. SOB, nausea, vomiting, sweating)? NO  3. How long have you been experiencing CP? Today for about 20 minutes.  4. Is your CP continuous or coming and going? Coming and going  5. Have you taken Nitroglycerin? No ?

## 2018-10-04 NOTE — Telephone Encounter (Signed)
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 This lasted around 20 minutes. He does not have any nitroglycerin to take. He denied any other symptoms such as shortness of breath, radiating pain or nausea.   He has an ECHO and follow up appointment with Dr. Lorenza Cambridge both scheduled on 10/07/2018.  He has been advised that if the pain returns and is not relieved then he should go to the ED for further evaluation.

## 2018-10-07 ENCOUNTER — Ambulatory Visit (INDEPENDENT_AMBULATORY_CARE_PROVIDER_SITE_OTHER): Payer: Medicare Other | Admitting: Cardiovascular Disease

## 2018-10-07 ENCOUNTER — Other Ambulatory Visit: Payer: Self-pay

## 2018-10-07 ENCOUNTER — Encounter: Payer: Self-pay | Admitting: Cardiovascular Disease

## 2018-10-07 ENCOUNTER — Ambulatory Visit (HOSPITAL_COMMUNITY): Payer: Medicare Other | Attending: Cardiovascular Disease

## 2018-10-07 VITALS — BP 128/81 | HR 63 | Ht 72.0 in | Wt 164.6 lb

## 2018-10-07 DIAGNOSIS — I484 Atypical atrial flutter: Secondary | ICD-10-CM

## 2018-10-07 DIAGNOSIS — R079 Chest pain, unspecified: Secondary | ICD-10-CM

## 2018-10-07 DIAGNOSIS — I480 Paroxysmal atrial fibrillation: Secondary | ICD-10-CM

## 2018-10-07 DIAGNOSIS — I058 Other rheumatic mitral valve diseases: Secondary | ICD-10-CM

## 2018-10-07 DIAGNOSIS — I340 Nonrheumatic mitral (valve) insufficiency: Secondary | ICD-10-CM

## 2018-10-07 DIAGNOSIS — I351 Nonrheumatic aortic (valve) insufficiency: Secondary | ICD-10-CM

## 2018-10-07 DIAGNOSIS — I70209 Unspecified atherosclerosis of native arteries of extremities, unspecified extremity: Secondary | ICD-10-CM

## 2018-10-07 DIAGNOSIS — I25810 Atherosclerosis of coronary artery bypass graft(s) without angina pectoris: Secondary | ICD-10-CM

## 2018-10-07 DIAGNOSIS — I25118 Atherosclerotic heart disease of native coronary artery with other forms of angina pectoris: Secondary | ICD-10-CM

## 2018-10-07 DIAGNOSIS — I2510 Atherosclerotic heart disease of native coronary artery without angina pectoris: Secondary | ICD-10-CM

## 2018-10-07 DIAGNOSIS — Z9889 Other specified postprocedural states: Secondary | ICD-10-CM

## 2018-10-07 DIAGNOSIS — I4892 Unspecified atrial flutter: Secondary | ICD-10-CM

## 2018-10-07 DIAGNOSIS — Z953 Presence of xenogenic heart valve: Secondary | ICD-10-CM

## 2018-10-07 DIAGNOSIS — I358 Other nonrheumatic aortic valve disorders: Secondary | ICD-10-CM

## 2018-10-07 LAB — ECHOCARDIOGRAM COMPLETE
Height: 72 in
Weight: 2633.6 oz

## 2018-10-07 MED ORDER — ATORVASTATIN CALCIUM 40 MG PO TABS: 40.0000 mg | ORAL_TABLET | Freq: Every day | ORAL | 11 refills | Status: DC

## 2018-10-07 MED ORDER — FUROSEMIDE 20 MG PO TABS: 20.0000 mg | ORAL_TABLET | Freq: Every day | ORAL | 11 refills | Status: DC

## 2018-10-07 MED ORDER — RIVAROXABAN 20 MG PO TABS: 20.0000 mg | ORAL_TABLET | Freq: Every day | ORAL | 11 refills | Status: DC

## 2018-10-07 NOTE — Progress Notes (Signed)
Patient ID: Alan [name], male   DOB: 05-Mar-1950, 68 y.o.   MRN: [id number] Patient ID: Alan [name], male   DOB: [birth year], 68 y.o.   MRN: [id number]    Cardiology Office Note    Date:  10/07/2018   ID:  Alan [name], DOB June 10, 1950, MRN [id number]  PCP:  Camille Bal, PA-C  Cardiologist:   Sanda Klein, MD   No chief complaint on file.   History of Present Illness:  Alan [name] is a 68 y.o. 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 He underwent aortic valve replacement with a biological prosthesis and mitral valve repair as well as a single-vessel LIMA to LAD bypass for incidentally discovered CAD. He also had a 90% stenosis in the distal segment of the left PDA that was not bypassed.   In January 2020 he presented with atrial flutter with 2: 1 AV block and exertional dyspnea, but without angina.  He underwent cardioversion on April 07, 2018 and had profound and protracted hypotension that did not respond to IV fluids and pressors.  TEE showed severe  mitral insufficiency.  Cardiac catheterization every 17 2020 was followed by placement of stents in the native LAD artery upstream of the LIMA bypass and the left circumflex coronary artery (both stents were resolute onyx 2.0 x 30).  He improved rapidly thereafter and by physical exam his mitral insufficiency appeared markedly improved.  A repeat echocardiogram has not yet been performed.  Before this appointment he experienced sudden and severe central chest pain that lasted for about 20 minutes.  He was very emotionally upset at the time.  He removed himself from the stressful situation and the symptoms abated, although he had lasting discomfort for about an hour.  Today he feels well and he has not had any recurrence of the chest pain.  He notes that he  never had chest pain on any of his previous presentations even when he was found to have severe stenoses.  He denies subjective palpitations, but when he checks his radial pulse he notes that it is frequently irregular, never fast (usually around 60 bpm).  He denies leg edema or claudication, syncope, new neurological complaints.  He has a lifelong history of depression but is currently in a stable mood. No real sequelae from his strokes.  He occasionally has some issues with wobbly gait.  On November 01, 2014, he underwent AVR with a bioprosthesis (23 mm MagnaEase),patch enlargement of the aortic root (Nicks' technique) and MV repair without annuloplasty ring, as well as single-vessel LIMA to LAD bypass for incidentally discovered CAD and clipping of the left atrial appendage (40 mm Atricure clip). Postop atrial flutter led to treatment with amiodarone and cardioversion on October 24 and a second cardioversion on January 11, 2015.  As the arrhythmia did not recur  anticoagulants were then discontinued, but were restarted when he presented with atrial flutter with 2: 1 AV block in January 2020.  April 12, 2018 received 2 stents to the proximal LAD upstream of the LIMA bypass and the left circumflex coronary artery (resolute onyx 2.030).  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   Coronary artery disease involving native coronary artery without angina pectoris 10/23/2014   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   Enterococcal  bacteremia 10/19/2014   Hepatitis    Memory difficulty 02/06/2017   Migraine 09/12/2015   Mitral valve regurgitation, 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/2014   Paroxysmal atrial fibrillation ([place]) 10/19/2014   Paroxysmal atrial flutter ([place]) 10/19/2014   Prostate cancer ([place])    Protein-calorie malnutrition, severe (HCC)    Restless leg syndrome    S/P  aortic valve replacement with bioprosthetic valve 11/01/2014   23 mm [facility] Ease bovine pericardial bioprosthetic tissue valve with bovine pericardial patch enlargement of aortic root   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   LIMA to LAD   S/P mitral valve repair 11/01/2014   Debridement of vegetation on anterior leaflet   Septic embolism ([place]) 10/20/2014   brain and spleen, subclinical   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    Past Surgical History:  Procedure Laterality Date   AORTIC ROOT ENLARGEMENT N/A 11/01/2014   Procedure: AORTIC ROOT ENLARGEMENT;  Surgeon: Rexene Alberts, MD;  Location: [place];  Service: Open Heart Surgery;  Laterality: N/A;   AORTIC VALVE REPLACEMENT N/A 11/01/2014   Procedure: AORTIC VALVE REPLACEMENT (AVR);  Surgeon: Rexene Alberts, MD;  Location: [facility];  Service: Open Heart Surgery;  Laterality: N/A;   CARDIAC CATHETERIZATION N/A 10/23/2014   Procedure: Right/Left Heart Cath and Coronary Angiography;  Surgeon: Lorretta Harp, MD;  Location: [place] CV LAB;  Service: Cardiovascular;  Laterality: N/A;   CARDIOVERSION N/A 12/18/2014   Procedure: CARDIOVERSION;  Surgeon: Pixie Casino, MD;  Location: [facility] ENDOSCOPY;  Service: Cardiovascular;  Laterality: N/A;   CARDIOVERSION N/A 02/06/2015   Procedure: CARDIOVERSION;  Surgeon: Larey Dresser, MD;  Location: [place];  Service: Cardiovascular;  Laterality: N/A;   CARDIOVERSION N/A 04/07/2018   Procedure: CARDIOVERSION;  Surgeon: Sanda Klein, MD;  Location: [place] ENDOSCOPY;  Service: Cardiovascular;  Laterality: N/A;   COLONOSCOPY N/A 10/25/2014   Procedure: COLONOSCOPY;  Surgeon: Arta Silence, MD;  Location: [facility] ENDOSCOPY;  Service: Endoscopy;  Laterality: N/A;   CORONARY ARTERY BYPASS GRAFT N/A 11/01/2014   Procedure: CORONARY ARTERY BYPASS GRAFTING (CABG) x 1 using internal mammary artery;  Surgeon: Rexene Alberts, MD;  Location: [place];  Service: Open Heart Surgery;  Laterality: N/A;   CORONARY CTO INTERVENTION N/A  04/12/2018   Procedure: CORONARY CTO INTERVENTION;  Surgeon: [name], MD;  Location: [place] CV LAB;  Service: Cardiovascular;  Laterality: N/A;   CORONARY STENT INTERVENTION N/A 04/12/2018   Procedure: CORONARY STENT INTERVENTION;  Surgeon: [name], MD;  Location: [place] CV LAB;  Service: Cardiovascular;  Laterality: N/A;   MITRAL VALVE REPAIR N/A 11/01/2014   Procedure: MITRAL VALVE REPAIR with no Ring;  Surgeon: Rexene Alberts, MD;  Location: [place];  Service: Open Heart Surgery;  Laterality: N/A;   PROSTATECTOMY     RIGHT/LEFT HEART CATH AND CORONARY/GRAFT ANGIOGRAPHY N/A 04/12/2018   Procedure: RIGHT/LEFT HEART CATH AND CORONARY/GRAFT ANGIOGRAPHY;  Surgeon: [name], MD;  Location: [place] CV LAB;  Service: Cardiovascular;  Laterality: N/A;   TEE WITHOUT CARDIOVERSION N/A 10/20/2014   Procedure: TRANSESOPHAGEAL ECHOCARDIOGRAM (TEE);  Surgeon: Josue Hector, MD;  Location: [place];  Service: Cardiovascular;  Laterality: N/A;   TEE WITHOUT CARDIOVERSION N/A 11/01/2014   Procedure: TRANSESOPHAGEAL ECHOCARDIOGRAM (TEE);  Surgeon: Rexene Alberts, MD;  Location: [place];  Service: Open Heart Surgery;  Laterality: N/A;   TEE WITHOUT CARDIOVERSION N/A 04/08/2018   Procedure: TRANSESOPHAGEAL ECHOCARDIOGRAM (TEE);  Surgeon: Jerline Pain, MD;  Location: [facility] ENDOSCOPY;  Service: Cardiovascular;  Laterality: N/A;  Current Outpatient Medications  Medication Sig Dispense Refill   albuterol (PROVENTIL HFA;VENTOLIN HFA) 108 (90 Base) MCG/ACT inhaler Inhale 2 puffs into the lungs every 6 (six) hours as needed for shortness of breath.  0   aspirin-acetaminophen-caffeine (EXCEDRIN MIGRAINE) 250-250-65 MG tablet Take 2 tablets by mouth daily as needed for migraine.     atorvastatin (LIPITOR) 40 MG tablet Take 1 tablet (40 mg total) by mouth daily. 30 tablet 11   clopidogrel (PLAVIX) 75 MG tablet Take 1 tablet (75 mg total) by mouth daily with breakfast. 30 tablet 5    Flaxseed, Linseed, (FLAXSEED OIL) 1200 MG CAPS Take 1,200 mg by mouth daily.     furosemide (LASIX) 20 MG tablet Take 1 tablet (20 mg total) by mouth daily. 30 tablet 11   gabapentin (NEURONTIN) 300 MG capsule Take 300 mg by mouth 2 (two) times daily. at dinner and at bedtime     Melatonin 5 MG CAPS Take 5 mg by mouth at bedtime.     Multiple Vitamins-Minerals (PRESERVISION AREDS 2) CAPS Take 1 capsule by mouth every evening.      rivaroxaban (XARELTO) 20 MG TABS tablet Take 1 tablet (20 mg total) by mouth daily with supper. 30 tablet 11   TURMERIC PO Take 1,950 mg by mouth every evening.     No current facility-administered medications for this visit.     Allergies:   Pollen extract   Social History   Socioeconomic History   Marital status: Married    Spouse name: Not on file   Number of children: 1   Years of education: Master's   Highest education level: Not on file  Occupational History   Occupation: Economist: [employer] resource strain: Not on file   Food insecurity    Worry: Not on file    Inability: Not on file   Transportation needs    Medical: Not on file    Non-medical: Not on file  Tobacco Use   Smoking status: Former Smoker   Smokeless tobacco: Never Used   Tobacco comment: Smoked a pipe age 22->28  Substance and Sexual Activity   Alcohol use: No    Alcohol/week: 0.0 standard drinks    Comment: Drank from age 46->38   Drug use: No   Sexual activity: Not on file  Lifestyle   Physical activity    Days per week: Not on file    Minutes per session: Not on file   Stress: Not on file  Relationships   Social connections    Talks on phone: Not on file    Gets together: Not on file    Attends religious service: Not on file    Active member of club or organization: Not on file    Attends meetings of clubs or organizations: Not on file    Relationship status: Not on file  Other Topics Concern   Not  on file  Social History Narrative   Lives at home w/ his wife   Right-handed   Caffeine: 2 cups daily     Family History:  The patient's family history includes Stroke in his father.   ROS:   Please see the history of present illness.    ROS All other systems reviewed and are negative.   PHYSICAL EXAM:   VS:  BP 128/81    Pulse 63    Ht 6' (1.829 m)    Wt 164 lb 9.6 oz (74.7 kg)  SpO2 98%    BMI 22.32 kg/m     General: Alert, oriented x3, no distress, lean, well-healed sternotomy scar Head: no evidence of trauma, PERRL, EOMI, no exophtalmos or lid lag, no myxedema, no xanthelasma; normal ears, nose and oropharynx Neck: normal jugular venous pulsations and no hepatojugular reflux; brisk carotid pulses without delay and no carotid bruits Chest: clear to auscultation, no signs of consolidation by percussion or palpation, normal fremitus, symmetrical and full respiratory excursions Cardiovascular: normal position and quality of the apical impulse, regular rhythm, normal first and second heart sounds, 2/6 early peaking aortic ejection murmur, 1/6 holosystolic murmur at the left lower sternal border, 8-9/2 holosystolic murmur at the apex radiating towards the axilla no diastolic murmurs, rubs or gallops Abdomen: no tenderness or distention, no masses by palpation, no abnormal pulsatility or arterial bruits, normal bowel sounds, no hepatosplenomegaly Extremities: no clubbing, cyanosis or edema; 2+ radial, ulnar and brachial pulses bilaterally; 2+ right femoral, posterior tibial and dorsalis pedis pulses; 2+ left femoral, posterior tibial and dorsalis pedis pulses; no subclavian or femoral bruits Neurological: grossly nonfocal Psych: Normal mood and affect  Wt Readings from Last 3 Encounters:  10/07/18 164 lb 9.6 oz (74.7 kg)  06/25/18 165 lb (74.8 kg)  04/20/18 166 lb (75.3 kg)    Studies/Labs Reviewed:   EKG:  EKG is ordered today.  It shows sinus bradycardia at 59 bpm with probable  left atrial enlargement and chronic nonspecific ST segment sagging most prominently seen in lead V6 and in the inferior leads. Recent Labs: 04/07/2018: ALT 26; Magnesium 1.7 04/13/2018: BUN 15; Creatinine, Ser 1.28; Hemoglobin 15.0; Platelets 212; Potassium 3.7; Sodium 137   Lipid Panel    Component Value Date/Time   CHOL 154 03/06/2016 0954   TRIG 71 03/06/2016 0954   HDL 45 03/06/2016 0954   CHOLHDL 3.4 03/06/2016 0954   VLDL 14 03/06/2016 0954   LDLCALC 95 03/06/2016 0954    ASSESSMENT:    1. Coronary artery disease involving native coronary artery of native heart with other form of angina pectoris (HCC)   2. Chest pain, unspecified type   3. Atypical atrial flutter ([place])   4. Nonrheumatic mitral (valve) insufficiency   5. History of aortic valve replacement with bioprosthetic valve   6. History of mitral valve repair      PLAN:  In order of problems listed above:   1. CAD: He did not have angina in the past either during his endocarditis episode or before he received 2 stents.  Therefore, I am not particularly suspicious that the recent episode of chest pain was coronary in etiology.  We will check a Lexiscan Myoview.  Pulmonary embolism is unlikely since he is on Xarelto and clopidogrel.  Aortic pathology is considered but would appear to be unlikely since the symptoms have resolved completely without major consequences in about 3 days.  We will get a chance to look at his ascending aorta on echocardiography today.   If he does not have any meaningful ischemia on his nuclear stress test we will plan to discontinue his clopidogrel and his Xarelto. 2. AFlutter: Possibly left atrial flutter (related to atriotomy), rather than typical cavotricuspid isthmus flutter.  No detectable recurrence since his cardioversion He had his left atrial appendage clipped at the time of surgery, so we could consider discontinuing his anticoagulant.  We will keep him on it until we clarify the cause of  his recent chest pain. I would like to avoid restarting amiodarone.  If  atrial flutter recurs, he should be evaluated by EP to map the reentrant circuit and see if he is a good candidate for ablation.  3.  Severe MR: Acute onset and subsequent resolution with coronary revascularization suggest ischemic mechanism.  Recheck echo today. 4. S/P AVR (bio) and MV repair: Reevaluate by echo today.  Reminded him that he needs endocarditis prophylaxis for dental and surgical procedures. 5. Depression: Seems to be well compensated on his current medications.  Medication Adjustments/Labs and Tests Ordered: Current medicines are reviewed at length with the patient today.  Concerns regarding medicines are outlined above.  Medication changes, Labs and Tests ordered today are listed below. Patient Instructions  Medication Instructions:  Your physician recommends that you continue on your current medications as directed. Please refer to the Current Medication list given to you today.  If you need a refill on your cardiac medications before your next appointment, please call your pharmacy.   Lab work: None ordered If you have labs (blood work) drawn today and your tests are completely normal, you will receive your results only by: [place] (if you have MyChart) OR A paper copy in the mail If you have any lab test that is abnormal or we need to change your treatment, we will call you to review the results.  Testing/Procedures: Your physician has requested that you have a lexiscan myoview. For further information please visit [email or web address]. Please follow instruction sheet, as given.  Medications to hold: Nothing to hold  Follow-Up: At [facility], you and your health needs are our priority.  As part of our continuing mission to provide you with exceptional heart care, we have created designated Provider Care Teams.  These Care Teams include your primary Cardiologist (physician) and Advanced  Practice Providers (APPs -  Physician Assistants and Nurse Practitioners) who all work together to provide you with the care you need, when you need it.  Pending the Lexiscan Myoview results   Any Other Special Instructions Will Be Listed Below (If Applicable).  How to prepare for your Myoview test:  1. Do not eat or drink after midnight 2. No caffeine for 24 hours prior to test 3. No smoking 24 hours prior to test. 4. Your medication may be taken with water.  If your doctor stopped a medication because of this test, do not take that medication. 5. Skirts or pants are appropriate. Please wear a short sleeve shirt. 6. No perfume, cologne or lotion.            Signed, Sanda Klein, MD  10/07/2018 9:40 AM    [facility] [address], [place], [place]  [address] Phone: [phone]; Fax: [phone]

## 2018-10-07 NOTE — Patient Instructions (Addendum)
Medication Instructions:  Your physician recommends that you continue on your current medications as directed. Please refer to the Current Medication list given to you today.  If you need a refill on your cardiac medications before your next appointment, please call your pharmacy.   Lab work: None ordered If you have labs (blood work) drawn today and your tests are completely normal, you will receive your results only by: [place] (if you have MyChart) OR A paper copy in the mail If you have any lab test that is abnormal or we need to change your treatment, we will call you to review the results.  Testing/Procedures: Your physician has requested that you have a lexiscan myoview. For further information please visit [email or web address]. Please follow instruction sheet, as given.  Medications to hold: Nothing to hold  Follow-Up: At [facility], you and your health needs are our priority.  As part of our continuing mission to provide you with exceptional heart care, we have created designated Provider Care Teams.  These Care Teams include your primary Cardiologist (physician) and Advanced Practice Providers (APPs -  Physician Assistants and Nurse Practitioners) who all work together to provide you with the care you need, when you need it.  Pending the Lexiscan Myoview results   Any Other Special Instructions Will Be Listed Below (If Applicable).  How to prepare for your Myoview test:  1. Do not eat or drink after midnight 2. No caffeine for 24 hours prior to test 3. No smoking 24 hours prior to test. 4. Your medication may be taken with water.  If your doctor stopped a medication because of this test, do not take that medication. 5. Skirts or pants are appropriate. Please wear a short sleeve shirt. 6. No perfume, cologne or lotion.

## 2018-10-08 ENCOUNTER — Telehealth (HOSPITAL_COMMUNITY): Payer: Self-pay

## 2018-10-08 NOTE — Telephone Encounter (Signed)
Encounter complete. 

## 2018-10-11 ENCOUNTER — Other Ambulatory Visit (INDEPENDENT_AMBULATORY_CARE_PROVIDER_SITE_OTHER): Payer: Medicare Other

## 2018-10-11 DIAGNOSIS — I2510 Atherosclerotic heart disease of native coronary artery without angina pectoris: Secondary | ICD-10-CM

## 2018-10-11 DIAGNOSIS — I484 Atypical atrial flutter: Secondary | ICD-10-CM

## 2018-10-11 DIAGNOSIS — I70209 Unspecified atherosclerosis of native arteries of extremities, unspecified extremity: Secondary | ICD-10-CM

## 2018-10-11 DIAGNOSIS — I059 Rheumatic mitral valve disease, unspecified: Secondary | ICD-10-CM

## 2018-10-11 DIAGNOSIS — R079 Chest pain, unspecified: Secondary | ICD-10-CM

## 2018-10-11 DIAGNOSIS — I4892 Unspecified atrial flutter: Secondary | ICD-10-CM

## 2018-10-11 DIAGNOSIS — I480 Paroxysmal atrial fibrillation: Secondary | ICD-10-CM

## 2018-10-11 DIAGNOSIS — I058 Other rheumatic mitral valve diseases: Secondary | ICD-10-CM

## 2018-10-11 DIAGNOSIS — I351 Nonrheumatic aortic (valve) insufficiency: Secondary | ICD-10-CM

## 2018-10-11 DIAGNOSIS — I358 Other nonrheumatic aortic valve disorders: Secondary | ICD-10-CM

## 2018-10-11 DIAGNOSIS — I340 Nonrheumatic mitral (valve) insufficiency: Secondary | ICD-10-CM

## 2018-10-14 ENCOUNTER — Other Ambulatory Visit: Payer: Self-pay

## 2018-10-14 ENCOUNTER — Ambulatory Visit (HOSPITAL_COMMUNITY)
Admission: RE | Admit: 2018-10-14 | Discharge: 2018-10-14 | Disposition: A | Discharge status: Home / Self Care | Payer: Medicare Other | Source: Ambulatory Visit | Attending: Cardiology | Admitting: Cardiology

## 2018-10-14 DIAGNOSIS — R079 Chest pain, unspecified: Secondary | ICD-10-CM | POA: Insufficient documentation

## 2018-10-14 LAB — MYOCARDIAL PERFUSION IMAGING
LV dias vol: 112 mL (ref 62–150)
LV sys vol: 37 mL
Peak HR: 88 {beats}/min
Rest HR: 60 {beats}/min
SDS: 0
SRS: 0
SSS: 0
TID: 1.11

## 2018-10-14 MED ORDER — TECHNETIUM TC 99M TETROFOSMIN IV KIT
31.6000 | PACK | Freq: Once | INTRAVENOUS | Status: AC | PRN
  Administered 2018-10-14: 31.6 via INTRAVENOUS
  Filled 2018-10-14: qty 32

## 2018-10-14 MED ORDER — TECHNETIUM TC 99M TETROFOSMIN IV KIT
10.6000 | PACK | Freq: Once | INTRAVENOUS | Status: AC | PRN
  Administered 2018-10-14: 10.6 via INTRAVENOUS
  Filled 2018-10-14: qty 11

## 2018-10-14 MED ORDER — REGADENOSON 0.4 MG/5ML IV SOLN
0.4000 mg | Freq: Once | INTRAVENOUS | Status: AC
  Administered 2018-10-14: 0.4 mg via INTRAVENOUS

## 2019-05-09 ENCOUNTER — Telehealth: Payer: Self-pay | Admitting: Cardiovascular Disease

## 2019-05-09 NOTE — Telephone Encounter (Signed)
Returned the call to the patient. He stated that for the past 6 weeks he has been having shortness of breath when he ambulates and some dizziness. He has not been checking his blood pressure but stated that his heart rates have been in the 70-90's range.  He was taking Metoprolol Succinate 25 mg once daily but stopped that a month ago when his prescription ran out. This was not noted on his medication list. He has also stopped taking the Furosemide 20 mg once daily.   He was offered an office visit for follow up but stated he could do a virtual visit. He has an appointment tomorrow with Kerin Ransom, PA.

## 2019-05-09 NOTE — Telephone Encounter (Signed)
  Pt c/o Shortness Of Breath: STAT if SOB developed within the last 24 hours or pt is noticeably SOB on the phone  1. Are you currently SOB (can you hear that pt is SOB on the phone)? No  2. How long have you been experiencing SOB? Started 6 weeks ago   3. Are you SOB when sitting or when up moving around? When up moving around  4. Are you currently experiencing any other symptoms? Dizziness, fatigue   Pt also wanted to ask if Dr. Sallyanne Kuster wanted him to continue taking Metoprolol.  Please call

## 2019-05-09 NOTE — Telephone Encounter (Signed)
OK, but may need an ECG or another form of rhythm diagnosis.Marland KitchenMarland Kitchen

## 2019-05-10 ENCOUNTER — Encounter: Payer: Self-pay | Admitting: Cardiology

## 2019-05-10 ENCOUNTER — Telehealth (INDEPENDENT_AMBULATORY_CARE_PROVIDER_SITE_OTHER): Payer: Medicare Other | Admitting: Cardiology

## 2019-05-10 ENCOUNTER — Telehealth: Payer: Self-pay

## 2019-05-10 VITALS — HR 79 | Ht 72.0 in | Wt 179.0 lb

## 2019-05-10 DIAGNOSIS — I2510 Atherosclerotic heart disease of native coronary artery without angina pectoris: Secondary | ICD-10-CM | POA: Diagnosis not present

## 2019-05-10 DIAGNOSIS — R0609 Other forms of dyspnea: Secondary | ICD-10-CM

## 2019-05-10 DIAGNOSIS — Z8673 Personal history of transient ischemic attack (TIA), and cerebral infarction without residual deficits: Secondary | ICD-10-CM

## 2019-05-10 DIAGNOSIS — Z9889 Other specified postprocedural states: Secondary | ICD-10-CM

## 2019-05-10 DIAGNOSIS — R0600 Dyspnea, unspecified: Secondary | ICD-10-CM | POA: Insufficient documentation

## 2019-05-10 DIAGNOSIS — Z953 Presence of xenogenic heart valve: Secondary | ICD-10-CM | POA: Diagnosis not present

## 2019-05-10 DIAGNOSIS — Z951 Presence of aortocoronary bypass graft: Secondary | ICD-10-CM

## 2019-05-10 DIAGNOSIS — I4892 Unspecified atrial flutter: Secondary | ICD-10-CM

## 2019-05-10 DIAGNOSIS — Z8679 Personal history of other diseases of the circulatory system: Secondary | ICD-10-CM

## 2019-05-10 DIAGNOSIS — Z7901 Long term (current) use of anticoagulants: Secondary | ICD-10-CM

## 2019-05-10 DIAGNOSIS — I480 Paroxysmal atrial fibrillation: Secondary | ICD-10-CM

## 2019-05-10 MED ORDER — POTASSIUM CHLORIDE CRYS ER 20 MEQ PO TBCR: 20.0000 meq | EXTENDED_RELEASE_TABLET | Freq: Every day | ORAL | 3 refills | Status: DC

## 2019-05-10 MED ORDER — FUROSEMIDE 20 MG PO TABS: 20.0000 mg | ORAL_TABLET | Freq: Every day | ORAL | 3 refills | Status: DC

## 2019-05-10 NOTE — Telephone Encounter (Addendum)
Called patient to discuss AVS instructions gave [place] recommendations and patient voiced understanding. AVS summary mailed to patient.  Patient was scheduled for an in office visit on 05/12/19 at [facility] with DOD per [facility].

## 2019-05-10 NOTE — Telephone Encounter (Signed)
  Patient Consent for Virtual Visit         Alan [name] has provided verbal consent on 05/10/2019 for a virtual visit (video or telephone).   CONSENT FOR VIRTUAL VISIT FOR:  Alan [name]  By participating in this virtual visit I agree to the following:  I hereby voluntarily request, consent and authorize [facility] and its employed or contracted physicians, Engineer, materials, nurse practitioners or other licensed health care professionals (the Practitioner), to provide me with telemedicine health care services (the "Services") as deemed necessary by the treating Practitioner. I acknowledge and consent to receive the Services by the Practitioner via telemedicine. I understand that the telemedicine visit will involve communicating with the Practitioner through live audiovisual communication technology and the disclosure of certain medical information by electronic transmission. I acknowledge that I have been given the opportunity to request an in-person assessment or other available alternative prior to the telemedicine visit and am voluntarily participating in the telemedicine visit.  I understand that I have the right to withhold or withdraw my consent to the use of telemedicine in the course of my care at any time, without affecting my right to future care or treatment, and that the Practitioner or I may terminate the telemedicine visit at any time. I understand that I have the right to inspect all information obtained and/or recorded in the course of the telemedicine visit and may receive copies of available information for a reasonable fee.  I understand that some of the potential risks of receiving the Services via telemedicine include:  Marland Kitchen Delay or interruption in medical evaluation due to technological equipment failure or disruption; . Information transmitted may not be sufficient (e.g. poor resolution of images) to allow for appropriate medical decision making by the  Practitioner; and/or  . In rare instances, security protocols could fail, causing a breach of personal health information.  Furthermore, I acknowledge that it is my responsibility to provide information about my medical history, conditions and care that is complete and accurate to the best of my ability. I acknowledge that Practitioner's advice, recommendations, and/or decision may be based on factors not within their control, such as incomplete or inaccurate data provided by me or distortions of diagnostic images or specimens that may result from electronic transmissions. I understand that the practice of medicine is not an exact science and that Practitioner makes no warranties or guarantees regarding treatment outcomes. I acknowledge that a copy of this consent can be made available to me via my patient portal ([name]), or I can request a printed copy by calling the office of [facility].    I understand that my insurance will be billed for this visit.   I have read or had this consent read to me. . I understand the contents of this consent, which adequately explains the benefits and risks of the Services being provided via telemedicine.  . I have been provided ample opportunity to ask questions regarding this consent and the Services and have had my questions answered to my satisfaction. . I give my informed consent for the services to be provided through the use of telemedicine in my medical care

## 2019-05-10 NOTE — Telephone Encounter (Signed)

## 2019-05-10 NOTE — Patient Instructions (Signed)
Medication Instructions:   Resume Lasix 40 mg daily  Start Potassium 20 meq daily  *If you need a refill on your cardiac medications before your next appointment, please call your pharmacy*  Lab Work: NONE ordered at this time of appointment   If you have labs (blood work) drawn today and your tests are completely normal, you will receive your results only by: Marland Kitchen MyChart Message (if you have MyChart) OR . A paper copy in the mail If you have any lab test that is abnormal or we need to change your treatment, we will call you to review the results.  Testing/Procedures: NONE ordered at this time of appointment   Follow-Up: At [facility], you and your health needs are our priority.  As part of our continuing mission to provide you with exceptional heart care, we have created designated Provider Care Teams.  These Care Teams include your primary Cardiologist (physician) and Advanced Practice Providers (APPs -  Physician Assistants and Nurse Practitioners) who all work together to provide you with the care you need, when you need it.  We recommend signing up for the patient portal called "MyChart".  Sign up information is provided on this After Visit Summary.  MyChart is used to connect with patients for Virtual Visits (Telemedicine).  Patients are able to view lab/test results, encounter notes, upcoming appointments, etc.  Non-urgent messages can be sent to your provider as well.   To learn more about what you can do with MyChart, go to [email or web address].    Your next appointment:   This week with an APP    The format for your next appointment:   In Person  Provider:   Almyra Deforest, PA-C, Fabian Sharp, PA-C, Roby Lofts, PA-C or Coletta Memos, NP, Jory Sims, NP, Sande Rives, PA-C  Other Instructions

## 2019-05-10 NOTE — Progress Notes (Signed)
Virtual Visit via Telephone Note   This visit type was conducted due to national recommendations for restrictions regarding the COVID-19 Pandemic (e.g. social distancing) in an effort to limit this patient's exposure and mitigate transmission in our community.  Due to his co-morbid illnesses, this patient is at least at moderate risk for complications without adequate follow up.  This format is felt to be most appropriate for this patient at this time.  The patient did not have access to video technology/had technical difficulties with video requiring transitioning to audio format only (telephone).  All issues noted in this document were discussed and addressed.  No physical exam could be performed with this format.  Please refer to the patient's chart for his  consent to telehealth for [facility].   The patient was identified using 2 identifiers.  Date:  05/10/2019   ID:  Alan [name], DOB [birth year], MRN [id number]  Patient Location: Home Provider Location: Home  PCP:  Aurea Graff, PA-C  Cardiologist:  Sanda Klein, MD  Electrophysiologist:  None   Evaluation Performed:  Follow-Up Visit  Chief Complaint:  DOE  History of Present Illness:    Alan [name] is a pleasant 69 y.o. 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  He underwent tissue AVR and MVR repair as well as an LIMA to LAD.  In January 2020 he had atrial flutter with 2-1 AV block.  He had exertional dyspnea.  He underwent cardioversion in February 2020 and was hypotensive postop.  He did not respond to IV fluids.  TEE done then showed severe mitral insufficiency and he underwent diagnostic catheterization.  This was followed by placement of stents in the native LAD upstream of the LIMA bypass into the left circumflex.  He improved after this and a repeat echocardiogram in August 2020 showed good LV  function and an improvement in his mitral insufficiency to mild.  In August 2020 he had an echocardiogram and Myoview for some symptoms of chest pain.  His Myoview was low risk and his echocardiogram showed an EF of 60% with mild MR and a normally functioning prosthetic aortic valve.  He called the office this week complaining of dyspnea on exertion.  He was contacted today for further evaluation.  The patient says he has been out of his furosemide for several weeks.  He is noted increasing dyspnea on exertion.  He is not having orthopnea.  His weight has gone up 14 pounds from his last office visit.  His symptoms do not seem to severe by his history.  He is unable to get his blood pressure but his heart rates in the 70s.  I suggested the best course would be for him to come to the office for physical exam and EKG and further evaluation.  He was reluctant because of Covid but I reassured him that office protocol was strict.  He also tells me he had his first [place] vaccine a week ago.  The patient does not have symptoms concerning for COVID-19 infection (fever, chills, cough, or new shortness of breath).    Past Medical History:  Diagnosis Date  . 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 Coronary artery disease involving native coronary artery without angina pectoris 10/23/2014  . 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 Enterococcal bacteremia 10/19/2014  . Hepatitis   .  Memory difficulty 02/06/2017  . Migraine 09/12/2015  . 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 Paroxysmal atrial fibrillation ([place]) 10/19/2014  . Paroxysmal atrial flutter ([place]) 10/19/2014  . Prostate cancer ([place])   . Protein-calorie malnutrition, severe ([place])   . Restless leg syndrome   . 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/2014   23 mm [facility] Ease bovine pericardial bioprosthetic tissue valve with  bovine pericardial patch enlargement of aortic root  . 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 S/P mitral valve repair 11/01/2014   Debridement of vegetation on anterior leaflet  . 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 AORTIC ROOT ENLARGEMENT N/A 11/01/2014   Procedure: AORTIC ROOT ENLARGEMENT;  Surgeon: Rexene Alberts, MD;  Location: [place];  Service: Open Heart Surgery;  Laterality: N/A;  . AORTIC VALVE REPLACEMENT N/A 11/01/2014   Procedure: AORTIC VALVE REPLACEMENT (AVR);  Surgeon: Rexene Alberts, MD;  Location: [place];  Service: Open Heart Surgery;  Laterality: N/A;  . CARDIAC CATHETERIZATION N/A 10/23/2014   Procedure: Right/Left Heart Cath and Coronary Angiography;  Surgeon: Lorretta Harp, MD;  Location: [place] CV LAB;  Service: Cardiovascular;  Laterality: N/A;  . CARDIOVERSION N/A 12/18/2014   Procedure: CARDIOVERSION;  Surgeon: Pixie Casino, MD;  Location: [facility] ENDOSCOPY;  Service: Cardiovascular;  Laterality: N/A;  . CARDIOVERSION N/A 02/06/2015   Procedure: CARDIOVERSION;  Surgeon: Larey Dresser, MD;  Location: [place];  Service: Cardiovascular;  Laterality: N/A;  . CARDIOVERSION N/A 04/07/2018   Procedure: CARDIOVERSION;  Surgeon: Sanda Klein, MD;  Location: [facility] ENDOSCOPY;  Service: Cardiovascular;  Laterality: N/A;  . COLONOSCOPY N/A 10/25/2014   Procedure: COLONOSCOPY;  Surgeon: Arta Silence, MD;  Location: [facility] ENDOSCOPY;  Service: Endoscopy;  Laterality: N/A;  . CORONARY ARTERY BYPASS GRAFT N/A 11/01/2014   Procedure: CORONARY ARTERY BYPASS GRAFTING (CABG) x 1 using internal mammary artery;  Surgeon: Rexene Alberts, MD;  Location: [place];  Service: Open Heart Surgery;  Laterality: N/A;  . CORONARY CTO INTERVENTION N/A 04/12/2018   Procedure: CORONARY CTO INTERVENTION;  Surgeon: [name], MD;  Location: [place] CV LAB;  Service: Cardiovascular;   Laterality: N/A;  . CORONARY STENT INTERVENTION N/A 04/12/2018   Procedure: CORONARY STENT INTERVENTION;  Surgeon: [name], MD;  Location: [place] CV LAB;  Service: Cardiovascular;  Laterality: N/A;  . MITRAL VALVE REPAIR N/A 11/01/2014   Procedure: MITRAL VALVE REPAIR with no Ring;  Surgeon: Rexene Alberts, MD;  Location: [place];  Service: Open Heart Surgery;  Laterality: N/A;  . PROSTATECTOMY    . RIGHT/LEFT HEART CATH AND CORONARY/GRAFT ANGIOGRAPHY N/A 04/12/2018   Procedure: RIGHT/LEFT HEART CATH AND CORONARY/GRAFT ANGIOGRAPHY;  Surgeon: [name], MD;  Location: [place] CV LAB;  Service: Cardiovascular;  Laterality: N/A;  . TEE WITHOUT CARDIOVERSION N/A 10/20/2014   Procedure: TRANSESOPHAGEAL ECHOCARDIOGRAM (TEE);  Surgeon: Josue Hector, MD;  Location: [place];  Service: Cardiovascular;  Laterality: N/A;  . TEE WITHOUT CARDIOVERSION N/A 11/01/2014   Procedure: TRANSESOPHAGEAL ECHOCARDIOGRAM (TEE);  Surgeon: Rexene Alberts, MD;  Location: [place];  Service: Open Heart Surgery;  Laterality: N/A;  . TEE WITHOUT CARDIOVERSION N/A 04/08/2018   Procedure: TRANSESOPHAGEAL ECHOCARDIOGRAM (TEE);  Surgeon: Jerline Pain, MD;  Location: [facility] ENDOSCOPY;  Service: Cardiovascular;  Laterality: N/A;     Current Meds  Medication Sig  .  aspirin-acetaminophen-caffeine (EXCEDRIN MIGRAINE) 250-250-65 MG tablet Take 2 tablets by mouth daily as needed for migraine.  Marland Kitchen atorvastatin (LIPITOR) 40 MG tablet Take 1 tablet (40 mg total) by mouth daily.  . Flaxseed, Linseed, (FLAXSEED OIL) 1200 MG CAPS Take 1,200 mg by mouth daily.  Marland Kitchen gabapentin (NEURONTIN) 300 MG capsule Take 300 mg by mouth 2 (two) times daily. at dinner and at bedtime  . Multiple Vitamins-Minerals (PRESERVISION AREDS 2) CAPS Take 1 capsule by mouth every evening.   . TURMERIC PO Take 1,950 mg by mouth every evening.     Allergies:   Pollen extract   Social History   Tobacco Use  . Smoking status: Former Research scientist (life sciences)  .  Smokeless tobacco: Never Used  . Tobacco comment: Smoked a pipe age 5->28  Substance Use Topics  . Alcohol use: No    Alcohol/week: 0.0 standard drinks    Comment: Drank from age 21->38  . Drug use: No     Family Hx: The patient's family history includes Stroke in his father.  ROS:   Please see the history of present illness.    All other systems reviewed and are negative.   Prior CV studies:   The following studies were reviewed today: Echo Aug 2020 Myoview Aug 2020  Labs/Other Tests and Data Reviewed:    EKG:  An ECG dated 10/07/2018 was personally reviewed today and demonstrated:  NSR- HR 59  Recent Labs: No results found for requested labs within last 8760 hours.   Recent Lipid Panel Lab Results  Component Value Date/Time   CHOL 154 03/06/2016 09:54 AM   TRIG 71 03/06/2016 09:54 AM   HDL 45 03/06/2016 09:54 AM   CHOLHDL 3.4 03/06/2016 09:54 AM   LDLCALC 95 03/06/2016 09:54 AM    Wt Readings from Last 3 Encounters:  05/10/19 179 lb (81.2 kg)  10/14/18 164 lb (74.4 kg)  10/07/18 164 lb 9.6 oz (74.7 kg)     Objective:    Vital Signs:  Pulse 79   Ht 6' (1.829 m)   Wt 179 lb (81.2 kg)   BMI 24.28 kg/m    VITAL SIGNS:  reviewed  ASSESSMENT & PLAN:    DOE- Present for 6 weeks.  He has also had weight gain since LOV and has been out of his Lasix for several weeks.  I will resume his Lasix 40 mg a day as well as potassium and have him come to the office this week for further evaluation.  S/P AVR and MV repair- July 2016- echo looked good Aug 2020  CAD- CABG x 1 (LIMA-LAD) July 2016.  S/P native LAD and CFX PCI with DES Jan 2020  PAF- He had this post op and again Jan 2020 (flutter). If he is back in AF he will need an EP consult (see Dr Croitoru's last [place]).  Plan: resume Lasix 40 mg and K+ 20 Meq. OV this week.   COVID-19 Education: The signs and symptoms of COVID-19 were discussed with the patient and how to seek care for testing (follow up with PCP  or arrange E-visit).  The importance of social distancing was discussed today.  Time:   Today, I have spent 15 minutes with the patient with telehealth technology discussing the above problems.     Medication Adjustments/Labs and Tests Ordered: Current medicines are reviewed at length with the patient today.  Concerns regarding medicines are outlined above.   Tests Ordered: No orders of the defined types were placed in this encounter.  Medication Changes: No orders of the defined types were placed in this encounter.   Follow Up:  In Person this week  Signed, Kerin Ransom, Hershal Coria  05/10/2019 9:36 AM    [place]

## 2019-05-10 NOTE — Progress Notes (Signed)
Thanks, [employer]. I agree

## 2019-05-12 ENCOUNTER — Encounter: Payer: Self-pay | Admitting: Internal Medicine

## 2019-05-12 ENCOUNTER — Other Ambulatory Visit: Payer: Self-pay

## 2019-05-12 ENCOUNTER — Ambulatory Visit (INDEPENDENT_AMBULATORY_CARE_PROVIDER_SITE_OTHER): Payer: Medicare Other | Admitting: Internal Medicine

## 2019-05-12 VITALS — BP 132/93 | HR 76 | Temp 97.3°F | Resp 16 | Ht 72.0 in | Wt 169.4 lb

## 2019-05-12 DIAGNOSIS — Z951 Presence of aortocoronary bypass graft: Secondary | ICD-10-CM | POA: Diagnosis not present

## 2019-05-12 DIAGNOSIS — Z9889 Other specified postprocedural states: Secondary | ICD-10-CM

## 2019-05-12 DIAGNOSIS — Z953 Presence of xenogenic heart valve: Secondary | ICD-10-CM | POA: Diagnosis not present

## 2019-05-12 DIAGNOSIS — R0600 Dyspnea, unspecified: Secondary | ICD-10-CM | POA: Diagnosis not present

## 2019-05-12 DIAGNOSIS — R0609 Other forms of dyspnea: Secondary | ICD-10-CM

## 2019-05-12 DIAGNOSIS — Z9861 Coronary angioplasty status: Secondary | ICD-10-CM

## 2019-05-12 DIAGNOSIS — I2510 Atherosclerotic heart disease of native coronary artery without angina pectoris: Secondary | ICD-10-CM

## 2019-05-12 NOTE — Patient Instructions (Signed)
Medication Instructions:  Your physician recommends that you continue on your current medications as directed. Please refer to the Current Medication list given to you today.  *If you need a refill on your cardiac medications before your next appointment, please call your pharmacy*   Follow-Up: At [facility], you and your health needs are our priority.  As part of our continuing mission to provide you with exceptional heart care, we have created designated Provider Care Teams.  These Care Teams include your primary Cardiologist (physician) and Advanced Practice Providers (APPs -  Physician Assistants and Nurse Practitioners) who all work together to provide you with the care you need, when you need it.  We recommend signing up for the patient portal called "MyChart".  Sign up information is provided on this After Visit Summary.  MyChart is used to connect with patients for Virtual Visits (Telemedicine).  Patients are able to view lab/test results, encounter notes, upcoming appointments, etc.  Non-urgent messages can be sent to your provider as well.   To learn more about what you can do with MyChart, go to [email or web address].    Your next appointment:   1 month(s)  The format for your next appointment:   In Person  Provider:   Sanda Klein, MD   Other Instructions

## 2019-05-12 NOTE — Progress Notes (Signed)
OFFICE NOTE  Chief Complaint:  Shortness of breath  Primary Care Physician: Aurea Graff, PA-C  HPI:  Alan [name] is a 69 y.o. male patient of Alan [name], with a past medial history significant for endocarditis in 2016 ultimately requiring a tissue AVR and MVR and single-vessel LIMA to LAD bypass.  In January 2020 he had atrial flutter for which she was cardioverted.  He ultimately was found to have severe MR and had stents in the LAD upstream of the LIMA bypass graft.  He has had reported dyspnea on exertion for about 6 months.  This past summer he underwent an echocardiogram as well as a Myoview stress test.  The Myoview was negative for ischemia and the echo showed stable valves with no significant increase in gradients with normal LV function.  [name] has been out of both his beta-blocker and diuretic for several months.  He felt that perhaps the prescriptions were just supposed to run out.  Nonetheless recently was seen by Alan Ransom, PA-C for virtual visit who restarted his medications.  Today he is seen in the office for follow-up from that.  He is generally asymptomatic.  Denies any worsening shortness of breath.  He thinks that after restarting medications he feels somewhat better but not quite the same.  He was concerned because a lot of the symptoms were similar to prior to when he had his heart surgery.  EKG today shows sinus rhythm at 74 with some inferior T wave changes.  He reports his weight has been stable.  He denies any worsening of swelling.  PMHx:  Past Medical History:  Diagnosis Date  . 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 Coronary artery disease involving native coronary artery without angina pectoris 10/23/2014  . 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 Enterococcal bacteremia 10/19/2014  . Hepatitis   . Memory difficulty 02/06/2017  . Migraine  09/12/2015  . 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 Paroxysmal atrial fibrillation ([place]) 10/19/2014  . Paroxysmal atrial flutter ([place]) 10/19/2014  . Prostate cancer ([place])   . Protein-calorie malnutrition, severe ([place])   . Restless leg syndrome   . 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/2014   23 mm [facility] Ease bovine pericardial bioprosthetic tissue valve with bovine pericardial patch enlargement of aortic root  . 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 S/P mitral valve repair 11/01/2014   Debridement of vegetation on anterior leaflet  . 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 AORTIC ROOT ENLARGEMENT N/A 11/01/2014   Procedure: AORTIC ROOT ENLARGEMENT;  Surgeon: Alan Alberts, MD;  Location: [place];  Service: Open Heart Surgery;  Laterality: N/A;  . AORTIC VALVE REPLACEMENT N/A 11/01/2014   Procedure: AORTIC VALVE REPLACEMENT (AVR);  Surgeon: Alan Alberts, MD;  Location: [place];  Service: Open Heart Surgery;  Laterality: N/A;  . CARDIAC CATHETERIZATION N/A 10/23/2014   Procedure: Right/Left Heart Cath and Coronary Angiography;  Surgeon: Alan Harp, MD;  Location: [place] CV LAB;  Service: Cardiovascular;  Laterality: N/A;  . CARDIOVERSION N/A 12/18/2014   Procedure: CARDIOVERSION;  Surgeon: Alan Casino, MD;  Location: [facility] ENDOSCOPY;  Service: Cardiovascular;  Laterality: N/A;  . CARDIOVERSION N/A 02/06/2015   Procedure: CARDIOVERSION;  Surgeon: Alan Dresser, MD;  Location: [place];  Service: Cardiovascular;  Laterality: N/A;  . CARDIOVERSION N/A 04/07/2018   Procedure: CARDIOVERSION;  Surgeon: Alan Klein, MD;  Location: [place] ENDOSCOPY;  Service: Cardiovascular;  Laterality: N/A;  . COLONOSCOPY N/A 10/25/2014   Procedure: COLONOSCOPY;  Surgeon: Alan Silence, MD;  Location: [facility] ENDOSCOPY;  Service: Endoscopy;  Laterality:  N/A;  . CORONARY ARTERY BYPASS GRAFT N/A 11/01/2014   Procedure: CORONARY ARTERY BYPASS GRAFTING (CABG) x 1 using internal mammary artery;  Surgeon: Alan Alberts, MD;  Location: [place];  Service: Open Heart Surgery;  Laterality: N/A;  . CORONARY CTO INTERVENTION N/A 04/12/2018   Procedure: CORONARY CTO INTERVENTION;  Surgeon: Alan Mocha, MD;  Location: [place] CV LAB;  Service: Cardiovascular;  Laterality: N/A;  . CORONARY STENT INTERVENTION N/A 04/12/2018   Procedure: CORONARY STENT INTERVENTION;  Surgeon: Alan Mocha, MD;  Location: [place] CV LAB;  Service: Cardiovascular;  Laterality: N/A;  . MITRAL VALVE REPAIR N/A 11/01/2014   Procedure: MITRAL VALVE REPAIR with no Ring;  Surgeon: Alan Alberts, MD;  Location: [place];  Service: Open Heart Surgery;  Laterality: N/A;  . PROSTATECTOMY    . RIGHT/LEFT HEART CATH AND CORONARY/GRAFT ANGIOGRAPHY N/A 04/12/2018   Procedure: RIGHT/LEFT HEART CATH AND CORONARY/GRAFT ANGIOGRAPHY;  Surgeon: Alan Mocha, MD;  Location: [place] CV LAB;  Service: Cardiovascular;  Laterality: N/A;  . TEE WITHOUT CARDIOVERSION N/A 10/20/2014   Procedure: TRANSESOPHAGEAL ECHOCARDIOGRAM (TEE);  Surgeon: Alan Hector, MD;  Location: [place];  Service: Cardiovascular;  Laterality: N/A;  . TEE WITHOUT CARDIOVERSION N/A 11/01/2014   Procedure: TRANSESOPHAGEAL ECHOCARDIOGRAM (TEE);  Surgeon: Alan Alberts, MD;  Location: [place];  Service: Open Heart Surgery;  Laterality: N/A;  . TEE WITHOUT CARDIOVERSION N/A 04/08/2018   Procedure: TRANSESOPHAGEAL ECHOCARDIOGRAM (TEE);  Surgeon: Alan Pain, MD;  Location: [facility] ENDOSCOPY;  Service: Cardiovascular;  Laterality: N/A;    FAMHx:  Family History  Problem Relation Age of Onset  . Stroke Father     SOCHx:   reports that he has quit smoking. He has never used smokeless tobacco. He reports that he does not drink alcohol or use drugs.  ALLERGIES:  Allergies  Allergen Reactions  . Pollen Extract Other  (See Comments)    sneezing    ROS: Pertinent items noted in HPI and remainder of comprehensive ROS otherwise negative.  HOME MEDS: Current Outpatient Medications on File Prior to Visit  Medication Sig Dispense Refill  . aspirin-acetaminophen-caffeine (EXCEDRIN MIGRAINE) 250-250-65 MG tablet Take 2 tablets by mouth daily as needed for migraine.    Marland Kitchen atorvastatin (LIPITOR) 40 MG tablet Take 1 tablet (40 mg total) by mouth daily. 30 tablet 11  . Flaxseed, Linseed, (FLAXSEED OIL) 1200 MG CAPS Take 1,200 mg by mouth daily.    . furosemide (LASIX) 20 MG tablet Take 1 tablet (20 mg total) by mouth daily. 90 tablet 3  . gabapentin (NEURONTIN) 300 MG capsule Take 300 mg by mouth 2 (two) times daily. at dinner and at bedtime    . Multiple Vitamins-Minerals (PRESERVISION AREDS 2) CAPS Take 1 capsule by mouth every evening.     . potassium chloride SA (KLOR-CON) 20 MEQ tablet Take 1 tablet (20 mEq total) by mouth daily. 90 tablet 3  . TURMERIC PO Take 1,950 mg by mouth every evening.     No current facility-administered medications on file prior to visit.    LABS/IMAGING: No results found for this or any previous visit (from the past 48 hour(s)). No  results found.  LIPID PANEL:    Component Value Date/Time   CHOL 154 03/06/2016 0954   TRIG 71 03/06/2016 0954   HDL 45 03/06/2016 0954   CHOLHDL 3.4 03/06/2016 0954   VLDL 14 03/06/2016 0954   LDLCALC 95 03/06/2016 0954     WEIGHTS: Wt Readings from Last 3 Encounters:  05/12/19 169 lb 6.4 oz (76.8 kg)  05/10/19 179 lb (81.2 kg)  10/14/18 164 lb (74.4 kg)    VITALS: BP (!) 132/93   Pulse 76   Temp (!) 97.3 F (36.3 C)   Resp 16   Ht 6' (1.829 m)   Wt 169 lb 6.4 oz (76.8 kg)   SpO2 97%   BMI 22.97 kg/m   EXAM: General appearance: alert and no distress Neck: no carotid bruit, no JVD and thyroid not enlarged, symmetric, no tenderness/mass/nodules Lungs: clear to auscultation bilaterally Heart: regular rate and rhythm, S1, S2  normal and Notable valve sound Abdomen: soft, non-tender; bowel sounds normal; no masses,  no organomegaly Extremities: extremities normal, atraumatic, no cyanosis or edema Pulses: 2+ and symmetric Skin: Skin color, texture, turgor normal. No rashes or lesions Neurologic: Grossly normal Psych: Pleasant  EKG: Normal sinus rhythm at 74, inferior ST and T wave changes- personally reviewed  ASSESSMENT: 1.  Dyspnea  PLAN: 1.   Mr. Masoner is complaining of dyspnea however seems to be somewhat stable for several weeks.  Work-up within the past 9 months was negative.  He does not appear to be in congestive heart failure with normal EF.  He could have a get some diastolic dysfunction.  He has been back on Lasix.  There is no evidence of any edema.  His EKG shows some nonspecific changes.  He is concerned was that this is very similar to his anginal symptoms however I do not see clear evidence of that.  Blood pressure is reasonable today.  I would recommend he monitor his symptoms and will schedule a follow-up with Dr. Loletha Grayer in about a month.  Hopefully after continuing his medication more regularly his symptoms will improve.  Alan Casino, MD, [facility], [place] Director of the Advanced Lipid Disorders &  Cardiovascular Risk Reduction Clinic Diplomate of the American Board of Clinical Lipidology Attending Cardiologist  Direct Dial: [phone]  Fax: [phone]  Website:  [email or web address] Deshone Lyssy 05/12/2019, 4:40 PM

## 2019-05-13 NOTE — Progress Notes (Signed)
Thank you, [place]!

## 2019-06-14 ENCOUNTER — Ambulatory Visit (INDEPENDENT_AMBULATORY_CARE_PROVIDER_SITE_OTHER): Payer: Medicare Other | Admitting: Cardiovascular Disease

## 2019-06-14 ENCOUNTER — Encounter: Payer: Self-pay | Admitting: Cardiovascular Disease

## 2019-06-14 ENCOUNTER — Other Ambulatory Visit: Payer: Self-pay

## 2019-06-14 VITALS — BP 124/79 | HR 72 | Temp 97.3°F | Resp 20 | Ht 72.0 in | Wt 169.2 lb

## 2019-06-14 DIAGNOSIS — Z9861 Coronary angioplasty status: Secondary | ICD-10-CM

## 2019-06-14 DIAGNOSIS — I484 Atypical atrial flutter: Secondary | ICD-10-CM | POA: Diagnosis not present

## 2019-06-14 DIAGNOSIS — Z953 Presence of xenogenic heart valve: Secondary | ICD-10-CM

## 2019-06-14 DIAGNOSIS — I25810 Atherosclerosis of coronary artery bypass graft(s) without angina pectoris: Secondary | ICD-10-CM

## 2019-06-14 DIAGNOSIS — Z9889 Other specified postprocedural states: Secondary | ICD-10-CM | POA: Diagnosis not present

## 2019-06-14 DIAGNOSIS — I2510 Atherosclerotic heart disease of native coronary artery without angina pectoris: Secondary | ICD-10-CM | POA: Diagnosis not present

## 2019-06-14 DIAGNOSIS — F418 Other specified anxiety disorders: Secondary | ICD-10-CM

## 2019-06-14 MED ORDER — FUROSEMIDE 20 MG PO TABS: 20.0000 mg | ORAL_TABLET | ORAL | 3 refills | Status: DC

## 2019-06-14 NOTE — Progress Notes (Signed)
Patient ID: Alan [name], male   DOB: [birth year], 69 y.o.   MRN: [id number] Patient ID: Alan [name], male   DOB: 1950-12-02, 69 y.o.   MRN: [id number]    Cardiology Office Note    Date:  06/14/2019   ID:  Alan [name], DOB [birth year], MRN [id number]  PCP:  Aurea Graff, PA-C (Inactive)  Cardiologist:   Sanda Klein, MD   Chief Complaint  Patient presents with  . Fatigue  . Coronary Artery Disease    History of Present Illness:  Alan [name] is a 69 y.o. 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 He underwent aortic valve replacement with a biological prosthesis and mitral valve repair as well as a single-vessel LIMA to LAD bypass for incidentally discovered CAD. He also had a 90% stenosis in the distal segment of the left PDA that was not bypassed.   In January 2020 he presented with atrial flutter with 2: 1 AV block and exertional dyspnea, but without angina.  He underwent cardioversion on April 07, 2018 and had profound and protracted hypotension that did not respond to IV fluids and pressors.  TEE showed severe  mitral insufficiency.  Cardiac catheterization every 17 2020 was followed by placement of stents in the native LAD artery upstream of the LIMA bypass and the left circumflex coronary artery (both stents were resolute onyx 2.0 x 30).  He improved rapidly thereafter and by physical exam his mitral insufficiency appeared markedly improved.  Follow-up echocardiogram showed only mild mitral insufficiency and normal left ventricular systolic function.  He has not had any major complaints recently but has fatigue.  Denies exertional dyspnea or angina.  No orthopnea, PND, edema.  No new neurological complaints.  In August 2020 he underwent a stress myocardial perfusion study that did not show any  perfusion defects (although he did have "false positive" ST segment depression during Lexiscan infusion).  His echocardiogram performed around the same time showed normal left ventricular ejection fraction at 60-65%, moderately dilated left atrium, since he had normal function of the aortic valve bioprosthesis.  The aortic root was minimally dilated at 37 mm.  On November 01, 2014, he underwent AVR with a bioprosthesis (23 mm MagnaEase),patch enlargement of the aortic root (Nicks' technique) and MV repair without annuloplasty ring, as well as single-vessel LIMA to LAD bypass for incidentally discovered CAD and clipping of the left atrial appendage (40 mm Atricure clip). Postop atrial flutter led to treatment with amiodarone and cardioversion on October 24 and a second cardioversion on January 11, 2015.  As the arrhythmia did not recur  anticoagulants were then discontinued, but were restarted when he presented with atrial flutter with 2: 1 AV block in January 2020.  April 12, 2018 received 2 stents to the proximal LAD upstream of the LIMA bypass and the left circumflex coronary artery (resolute onyx 2.030).  Past Medical History:  Diagnosis Date  . 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 Coronary artery disease involving native coronary artery without angina pectoris 10/23/2014  . 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 Enterococcal bacteremia 10/19/2014  . Hepatitis   . Memory difficulty 02/06/2017  . Migraine 09/12/2015  . 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 Paroxysmal atrial fibrillation ([place]) 10/19/2014  . Paroxysmal atrial flutter ([place]) 10/19/2014  . Prostate cancer (  [place])   . Protein-calorie malnutrition, severe ([place])   . Restless leg syndrome   . 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/2014   23 mm [facility] Ease bovine pericardial bioprosthetic tissue valve  with bovine pericardial patch enlargement of aortic root  . 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 S/P mitral valve repair 11/01/2014   Debridement of vegetation on anterior leaflet  . 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 AORTIC ROOT ENLARGEMENT N/A 11/01/2014   Procedure: AORTIC ROOT ENLARGEMENT;  Surgeon: Rexene Alberts, MD;  Location: [place];  Service: Open Heart Surgery;  Laterality: N/A;  . AORTIC VALVE REPLACEMENT N/A 11/01/2014   Procedure: AORTIC VALVE REPLACEMENT (AVR);  Surgeon: Rexene Alberts, MD;  Location: [facility];  Service: Open Heart Surgery;  Laterality: N/A;  . CARDIAC CATHETERIZATION N/A 10/23/2014   Procedure: Right/Left Heart Cath and Coronary Angiography;  Surgeon: Lorretta Harp, MD;  Location: [place] CV LAB;  Service: Cardiovascular;  Laterality: N/A;  . CARDIOVERSION N/A 12/18/2014   Procedure: CARDIOVERSION;  Surgeon: Pixie Casino, MD;  Location: [facility] ENDOSCOPY;  Service: Cardiovascular;  Laterality: N/A;  . CARDIOVERSION N/A 02/06/2015   Procedure: CARDIOVERSION;  Surgeon: Larey Dresser, MD;  Location: [place];  Service: Cardiovascular;  Laterality: N/A;  . CARDIOVERSION N/A 04/07/2018   Procedure: CARDIOVERSION;  Surgeon: Sanda Klein, MD;  Location[facility] ENDOSCOPY;  Service: Cardiovascular;  Laterality: N/A;  . COLONOSCOPY N/A 10/25/2014   Procedure: COLONOSCOPY;  Surgeon: Arta Silence, MD;  Location: [place] ENDOSCOPY;  Service: Endoscopy;  Laterality: N/A;  . CORONARY ARTERY BYPASS GRAFT N/A 11/01/2014   Procedure: CORONARY ARTERY BYPASS GRAFTING (CABG) x 1 using internal mammary artery;  Surgeon: Rexene Alberts, MD;  Location: [place];  Service: Open Heart Surgery;  Laterality: N/A;  . CORONARY CTO INTERVENTION N/A 04/12/2018   Procedure: CORONARY CTO INTERVENTION;  Surgeon: [name], MD;  Location: [place] CV LAB;  Service:  Cardiovascular;  Laterality: N/A;  . CORONARY STENT INTERVENTION N/A 04/12/2018   Procedure: CORONARY STENT INTERVENTION;  Surgeon: [name], MD;  Location: [place] CV LAB;  Service: Cardiovascular;  Laterality: N/A;  . MITRAL VALVE REPAIR N/A 11/01/2014   Procedure: MITRAL VALVE REPAIR with no Ring;  Surgeon: Rexene Alberts, MD;  Location: [place];  Service: Open Heart Surgery;  Laterality: N/A;  . PROSTATECTOMY    . RIGHT/LEFT HEART CATH AND CORONARY/GRAFT ANGIOGRAPHY N/A 04/12/2018   Procedure: RIGHT/LEFT HEART CATH AND CORONARY/GRAFT ANGIOGRAPHY;  Surgeon: [name], MD;  Location: [place] CV LAB;  Service: Cardiovascular;  Laterality: N/A;  . TEE WITHOUT CARDIOVERSION N/A 10/20/2014   Procedure: TRANSESOPHAGEAL ECHOCARDIOGRAM (TEE);  Surgeon: Josue Hector, MD;  Location: [place];  Service: Cardiovascular;  Laterality: N/A;  . TEE WITHOUT CARDIOVERSION N/A 11/01/2014   Procedure: TRANSESOPHAGEAL ECHOCARDIOGRAM (TEE);  Surgeon: Rexene Alberts, MD;  Location: [place];  Service: Open Heart Surgery;  Laterality: N/A;  . TEE WITHOUT CARDIOVERSION N/A 04/08/2018   Procedure: TRANSESOPHAGEAL ECHOCARDIOGRAM (TEE);  Surgeon: Jerline Pain, MD;  Location: [facility] ENDOSCOPY;  Service: Cardiovascular;  Laterality: N/A;    Current Outpatient Medications  Medication Sig Dispense Refill  . aspirin-acetaminophen-caffeine (EXCEDRIN MIGRAINE) 250-250-65 MG tablet Take 2 tablets by mouth daily as needed for migraine.    Marland Kitchen atorvastatin (LIPITOR) 40 MG tablet Take 1 tablet (  40 mg total) by mouth daily. 30 tablet 11  . Flaxseed, Linseed, (FLAXSEED OIL) 1200 MG CAPS Take 1,200 mg by mouth daily.    . furosemide (LASIX) 20 MG tablet Take 1 tablet (20 mg total) by mouth every other day. 20 tablet 3  . gabapentin (NEURONTIN) 300 MG capsule Take 300 mg by mouth 2 (two) times daily. at dinner and at bedtime    . Multiple Vitamins-Minerals (PRESERVISION AREDS 2) CAPS Take 1 capsule by mouth every  evening.     . potassium chloride SA (KLOR-CON) 20 MEQ tablet Take 1 tablet (20 mEq total) by mouth daily. 90 tablet 3  . TURMERIC PO Take 1,950 mg by mouth every evening.     No current facility-administered medications for this visit.    Allergies:   Pollen extract   Social History   Socioeconomic History  . Marital status: Married    Spouse name: Not on file  . Number of children: 1  . Years of education: Master's  . Highest education level: Not on file  Occupational History  . Occupation: Books[employer]    Employer: [employer]  Tobacco Use  . Smoking status: Former Research scientist (life sciences)  . Smokeless tobacco: Never Used  . Tobacco comment: Smoked a pipe age 44->28  Substance and Sexual Activity  . Alcohol use: No    Alcohol/week: 0.0 standard drinks    Comment: Drank from age 71->38  . Drug use: No  . Sexual activity: Not on file  Other Topics Concern  . Not on file  Social History Narrative   Lives at home w/ his wife   Right-handed   Caffeine: 2 cups daily   Social Determinants of Health   Financial Resource Strain:   . Difficulty of Paying Living Expenses:   Food Insecurity:   . Worried About Charity fundraiser in the Last Year:   . Arboriculturist in the Last Year:   Transportation Needs:   . Film/video editor (Medical):   Marland Kitchen Lack of Transportation (Non-Medical):   Physical Activity:   . Days of Exercise per Week:   . Minutes of Exercise per Session:   Stress:   . Feeling of Stress :   Social Connections:   . Frequency of Communication with Friends and Family:   . Frequency of Social Gatherings with Friends and Family:   . Attends Religious Services:   . Active Member of Clubs or Organizations:   . Attends Archivist Meetings:   Marland Kitchen Marital Status:      Family History:  The patient's family history includes Stroke in his father.   ROS:   Please see the history of present illness.    ROS All other systems reviewed and are negative.   PHYSICAL EXAM:     VS:  BP 124/79   Pulse 72   Temp (!) 97.3 F (36.3 C)   Resp 20   Ht 6' (1.829 m)   Wt 169 lb 3.2 oz (76.7 kg)   SpO2 97%   BMI 22.95 kg/m      General: Alert, oriented x3, no distress, appears well Head: no evidence of trauma, PERRL, EOMI, no exophtalmos or lid lag, no myxedema, no xanthelasma; normal ears, nose and oropharynx Neck: normal jugular venous pulsations and no hepatojugular reflux; brisk carotid pulses without delay and no carotid bruits Chest: clear to auscultation, no signs of consolidation by percussion or palpation, normal fremitus, symmetrical and full respiratory excursions Cardiovascular: normal position and quality  of the apical impulse, regular rhythm, normal first and second heart sounds, 1-2/6 early peaking aortic ejection murmur, barely audible left parasternal holosystolic murmur, no apical murmurs, no diastolic murmurs, rubs or gallops Abdomen: no tenderness or distention, no masses by palpation, no abnormal pulsatility or arterial bruits, normal bowel sounds, no hepatosplenomegaly Extremities: no clubbing, cyanosis or edema; 2+ radial, ulnar and brachial pulses bilaterally; 2+ right femoral, posterior tibial and dorsalis pedis pulses; 2+ left femoral, posterior tibial and dorsalis pedis pulses; no subclavian or femoral bruits Neurological: grossly nonfocal Psych: Normal mood and affect   Wt Readings from Last 3 Encounters:  06/14/19 169 lb 3.2 oz (76.7 kg)  05/12/19 169 lb 6.4 oz (76.8 kg)  05/10/19 179 lb (81.2 kg)    Studies/Labs Reviewed:   EKG:  EKG is not ordered today.   Recent Labs: No results found for requested labs within last 8760 hours.   Lipid Panel    Component Value Date/Time   CHOL 154 03/06/2016 0954   TRIG 71 03/06/2016 0954   HDL 45 03/06/2016 0954   CHOLHDL 3.4 03/06/2016 0954   VLDL 14 03/06/2016 0954   LDLCALC 95 03/06/2016 0954   01/26/2019 Total cholesterol 119, HDL 45, triglycerides 83  ASSESSMENT:    1. Coronary  artery disease involving coronary bypass graft of native heart without angina pectoris   2. Atypical atrial flutter (HCC)   3. History of mitral valve repair   4. S/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   5. Depression with anxiety      PLAN:  In order of problems listed above:   1. CAD: He did not have angina in the past either during his endocarditis episode or before he received 2 stents.  No longer on clopidogrel, but should be on aspirin 81 mg daily.  On statin with LDL cholesterol within target range (<70). 2. AFlutter: Possibly left atrial flutter (related to atriotomy), rather than typical cavotricuspid isthmus flutter.  No detectable recurrence since his cardioversion. He had his left atrial appendage clipped at the time of surgery, so we stopped his anticoagulation. 3.  Mild MR: Acute onset of severe mitral insufficiency and subsequent resolution with coronary revascularization is consistent with ischemic mechanism.  Follow-up echo shows only mild MR.  Unable to detect a murmur of MR on today's exam. 4. S/P AVR (bio) and MV repair: Both his aortic valve bioprosthesis and his mitral valve repair are were holding out well based on the echo from August 2020.  Reminded him that he needs endocarditis prophylaxis for dental and surgical procedures. 5. Depression: Appears to have stable mood today. 6. Fatigue: I wonder if this may be an expression of relative hypovolemia.  There is really no indication for continued diuretic therapy at this point.  Asked him to decrease the furosemide to every other day and plan to discontinue it altogether if he does not develop edema or dyspnea.  Medication Adjustments/Labs and Tests Ordered: Current medicines are reviewed at length with the patient today.  Concerns regarding medicines are outlined above.  Medication changes, Labs and Tests ordered today are listed below. Patient Instructions  Medication Instructions:  FUROSEMIDE: Decrease  to 20 mg once every other day  *If you need a refill on your cardiac medications before your next appointment, please call your pharmacy*   Lab Work: None ordered If you have labs (blood work) drawn today and your tests are completely normal, you will receive your results only by: Marland Kitchen MyChart Message (if you  have MyChart) OR . A paper copy in the mail If you have any lab test that is abnormal or we need to change your treatment, we will call you to review the results.   Testing/Procedures: None ordered   Follow-Up: At [facility], you and your health needs are our priority.  As part of our continuing mission to provide you with exceptional heart care, we have created designated Provider Care Teams.  These Care Teams include your primary Cardiologist (physician) and Advanced Practice Providers (APPs -  Physician Assistants and Nurse Practitioners) who all work together to provide you with the care you need, when you need it.  We recommend signing up for the patient portal called "MyChart".  Sign up information is provided on this After Visit Summary.  MyChart is used to connect with patients for Virtual Visits (Telemedicine).  Patients are able to view lab/test results, encounter notes, upcoming appointments, etc.  Non-urgent messages can be sent to your provider as well.   To learn more about what you can do with MyChart, go to [email or web address].    Your next appointment:   12 month(s)  The format for your next appointment:   In Person  Provider:   You may see Sanda Klein, MD or one of the following Advanced Practice Providers on your designated Care Team:    Almyra Deforest, PA-C  Fabian Sharp, [place] or   Roby Lofts, PA-C        Signed, Sanda Klein, MD  06/14/2019 1:31 PM    [facility] [place] [place], [place], [place]  [address] Phone: [phone]; Fax: [phone]

## 2019-06-14 NOTE — Patient Instructions (Signed)
Medication Instructions:  FUROSEMIDE: Decrease to 20 mg once every other day  *If you need a refill on your cardiac medications before your next appointment, please call your pharmacy*   Lab Work: None ordered If you have labs (blood work) drawn today and your tests are completely normal, you will receive your results only by: Marland Kitchen MyChart Message (if you have MyChart) OR . A paper copy in the mail If you have any lab test that is abnormal or we need to change your treatment, we will call you to review the results.   Testing/Procedures: None ordered   Follow-Up: At [facility], you and your health needs are our priority.  As part of our continuing mission to provide you with exceptional heart care, we have created designated Provider Care Teams.  These Care Teams include your primary Cardiologist (physician) and Advanced Practice Providers (APPs -  Physician Assistants and Nurse Practitioners) who all work together to provide you with the care you need, when you need it.  We recommend signing up for the patient portal called "MyChart".  Sign up information is provided on this After Visit Summary.  MyChart is used to connect with patients for Virtual Visits (Telemedicine).  Patients are able to view lab/test results, encounter notes, upcoming appointments, etc.  Non-urgent messages can be sent to your provider as well.   To learn more about what you can do with MyChart, go to [email or web address].    Your next appointment:   12 month(s)  The format for your next appointment:   In Person  Provider:   You may see Sanda Klein, MD or one of the following Advanced Practice Providers on your designated Care Team:    Almyra Deforest, PA-C  Fabian Sharp, PA-C or   Roby Lofts, [place]

## 2019-07-14 ENCOUNTER — Telehealth: Payer: Self-pay | Admitting: Cardiovascular Disease

## 2019-07-14 NOTE — Telephone Encounter (Signed)
New Message    Pt c/o medication issue:  1. Name of Medication: Albuterol Inhaler   2. How are you currently taking this medication (dosage and times per day)? Out of medication   3. Are you having a reaction (difficulty breathing--STAT)? No   4. What is your medication issue? Pt is wondering if he can get a prescription for an Albuterol Inhaler. He says he is out of his and has asthma. The weather has been bothering him and he is asking to get a refill    Please advise

## 2019-07-14 NOTE — Telephone Encounter (Signed)
The patient has been made aware that he will need to call his PCP for a Albuterol prescription. He has verbalized his understanding.

## 2019-07-25 ENCOUNTER — Emergency Department (HOSPITAL_COMMUNITY): Payer: Medicare Other

## 2019-07-25 ENCOUNTER — Other Ambulatory Visit: Payer: Self-pay

## 2019-07-25 ENCOUNTER — Inpatient Hospital Stay (HOSPITAL_COMMUNITY)
Admission: EM | Admit: 2019-07-25 | Discharge: 2019-07-28 | DRG: 308 | Disposition: A | Discharge status: Home / Self Care | Payer: Medicare Other | Attending: Cardiology | Admitting: Cardiology

## 2019-07-25 ENCOUNTER — Encounter (HOSPITAL_COMMUNITY): Payer: Self-pay | Admitting: Emergency Medicine

## 2019-07-25 ENCOUNTER — Inpatient Hospital Stay (HOSPITAL_COMMUNITY): Payer: Medicare Other

## 2019-07-25 DIAGNOSIS — R079 Chest pain, unspecified: Secondary | ICD-10-CM | POA: Diagnosis present

## 2019-07-25 DIAGNOSIS — Z951 Presence of aortocoronary bypass graft: Secondary | ICD-10-CM

## 2019-07-25 DIAGNOSIS — I5033 Acute on chronic diastolic (congestive) heart failure: Secondary | ICD-10-CM | POA: Diagnosis present

## 2019-07-25 DIAGNOSIS — Z8546 Personal history of malignant neoplasm of prostate: Secondary | ICD-10-CM | POA: Diagnosis not present

## 2019-07-25 DIAGNOSIS — Z953 Presence of xenogenic heart valve: Secondary | ICD-10-CM | POA: Diagnosis not present

## 2019-07-25 DIAGNOSIS — E785 Hyperlipidemia, unspecified: Secondary | ICD-10-CM | POA: Diagnosis present

## 2019-07-25 DIAGNOSIS — I739 Peripheral vascular disease, unspecified: Secondary | ICD-10-CM | POA: Diagnosis present

## 2019-07-25 DIAGNOSIS — Z20822 Contact with and (suspected) exposure to covid-19: Secondary | ICD-10-CM | POA: Diagnosis present

## 2019-07-25 DIAGNOSIS — Z823 Family history of stroke: Secondary | ICD-10-CM | POA: Diagnosis not present

## 2019-07-25 DIAGNOSIS — I4589 Other specified conduction disorders: Secondary | ICD-10-CM | POA: Diagnosis present

## 2019-07-25 DIAGNOSIS — E872 Acidosis: Secondary | ICD-10-CM | POA: Diagnosis present

## 2019-07-25 DIAGNOSIS — R0789 Other chest pain: Secondary | ICD-10-CM | POA: Diagnosis not present

## 2019-07-25 DIAGNOSIS — I361 Nonrheumatic tricuspid (valve) insufficiency: Secondary | ICD-10-CM

## 2019-07-25 DIAGNOSIS — I483 Typical atrial flutter: Principal | ICD-10-CM | POA: Diagnosis present

## 2019-07-25 DIAGNOSIS — I9581 Postprocedural hypotension: Secondary | ICD-10-CM | POA: Diagnosis not present

## 2019-07-25 DIAGNOSIS — Z9889 Other specified postprocedural states: Secondary | ICD-10-CM

## 2019-07-25 DIAGNOSIS — Z87891 Personal history of nicotine dependence: Secondary | ICD-10-CM

## 2019-07-25 DIAGNOSIS — I340 Nonrheumatic mitral (valve) insufficiency: Secondary | ICD-10-CM

## 2019-07-25 DIAGNOSIS — Z79899 Other long term (current) drug therapy: Secondary | ICD-10-CM

## 2019-07-25 DIAGNOSIS — I2510 Atherosclerotic heart disease of native coronary artery without angina pectoris: Secondary | ICD-10-CM | POA: Diagnosis present

## 2019-07-25 DIAGNOSIS — Z955 Presence of coronary angioplasty implant and graft: Secondary | ICD-10-CM

## 2019-07-25 DIAGNOSIS — R0602 Shortness of breath: Secondary | ICD-10-CM | POA: Diagnosis not present

## 2019-07-25 DIAGNOSIS — I509 Heart failure, unspecified: Secondary | ICD-10-CM

## 2019-07-25 DIAGNOSIS — Z8673 Personal history of transient ischemic attack (TIA), and cerebral infarction without residual deficits: Secondary | ICD-10-CM | POA: Diagnosis not present

## 2019-07-25 DIAGNOSIS — I4892 Unspecified atrial flutter: Secondary | ICD-10-CM | POA: Diagnosis present

## 2019-07-25 DIAGNOSIS — I351 Nonrheumatic aortic (valve) insufficiency: Secondary | ICD-10-CM | POA: Diagnosis not present

## 2019-07-25 LAB — COMPREHENSIVE METABOLIC PANEL
ALT: 26 U/L (ref 0–44)
AST: 36 U/L (ref 15–41)
Albumin: 3.5 g/dL (ref 3.5–5.0)
Alkaline Phosphatase: 56 U/L (ref 38–126)
Anion gap: 11 (ref 5–15)
BUN: 15 mg/dL (ref 8–23)
CO2: 20 mmol/L — ABNORMAL LOW (ref 22–32)
Calcium: 9.4 mg/dL (ref 8.9–10.3)
Chloride: 110 mmol/L (ref 98–111)
Creatinine, Ser: 1.04 mg/dL (ref 0.61–1.24)
GFR calc Af Amer: >60 mL/min (ref >60)
GFR calc non Af Amer: >60 mL/min (ref >60)
Glucose, Bld: 102 mg/dL — ABNORMAL HIGH (ref 70–99)
Potassium: 4.6 mmol/L (ref 3.5–5.1)
Sodium: 141 mmol/L (ref 135–145)
Total Bilirubin: 1.1 mg/dL (ref 0.3–1.2)
Total Protein: 6.4 g/dL — ABNORMAL LOW (ref 6.5–8.1)

## 2019-07-25 LAB — CBG MONITORING, ED: Glucose-Capillary: 86 mg/dL (ref 70–99)

## 2019-07-25 LAB — CBC
HCT: 43.5 % (ref 39.0–52.0)
Hemoglobin: 13.6 g/dL (ref 13.0–17.0)
MCH: 26.8 pg (ref 26.0–34.0)
MCHC: 31.3 g/dL (ref 30.0–36.0)
MCV: 85.8 fL (ref 80.0–100.0)
Platelets: 213 10*3/uL (ref 150–400)
RBC: 5.07 MIL/uL (ref 4.22–5.81)
RDW: 16 % — ABNORMAL HIGH (ref 11.5–15.5)
WBC: 6.7 10*3/uL (ref 4.0–10.5)
nRBC: 0 % (ref 0.0–0.2)

## 2019-07-25 LAB — SARS CORONAVIRUS 2 BY RT PCR (HOSPITAL ORDER, PERFORMED IN ~~LOC~~ HOSPITAL LAB): SARS Coronavirus 2: NEGATIVE

## 2019-07-25 LAB — ECHOCARDIOGRAM COMPLETE
Height: 72 in
Weight: 2818.36 oz

## 2019-07-25 LAB — TROPONIN I (HIGH SENSITIVITY)
Troponin I (High Sensitivity): 36 ng/L — ABNORMAL HIGH (ref <18)
Troponin I (High Sensitivity): 35 ng/L — ABNORMAL HIGH (ref <18)
Troponin I (High Sensitivity): 42 ng/L — ABNORMAL HIGH (ref <18)
Troponin I (High Sensitivity): 41 ng/L — ABNORMAL HIGH (ref <18)

## 2019-07-25 LAB — BRAIN NATRIURETIC PEPTIDE: B Natriuretic Peptide: 458.3 pg/mL — ABNORMAL HIGH (ref 0.0–100.0)

## 2019-07-25 LAB — MRSA PCR SCREENING: MRSA by PCR: NEGATIVE

## 2019-07-25 MED ORDER — ASPIRIN EC 81 MG PO TBEC
81.0000 mg | DELAYED_RELEASE_TABLET | Freq: Every day | ORAL | Status: DC
  Administered 2019-07-26 – 2019-07-27 (×2): 81 mg via ORAL
  Filled 2019-07-25 (×2): qty 1

## 2019-07-25 MED ORDER — IOHEXOL 350 MG/ML SOLN
100.0000 mL | Freq: Once | INTRAVENOUS | Status: AC | PRN
  Administered 2019-07-25: 100 mL via INTRAVENOUS

## 2019-07-25 MED ORDER — NITROGLYCERIN 0.4 MG SL SUBL: 0.4000 mg | SUBLINGUAL_TABLET | SUBLINGUAL | Status: DC | PRN

## 2019-07-25 MED ORDER — FUROSEMIDE 10 MG/ML IJ SOLN
20.0000 mg | Freq: Once | INTRAMUSCULAR | Status: AC
  Administered 2019-07-25: 20 mg via INTRAVENOUS
  Filled 2019-07-25: qty 2

## 2019-07-25 MED ORDER — TRAZODONE HCL 50 MG PO TABS
50.0000 mg | ORAL_TABLET | Freq: Every day | ORAL | Status: DC
  Administered 2019-07-25 – 2019-07-27 (×3): 50 mg via ORAL
  Filled 2019-07-25 (×3): qty 1

## 2019-07-25 MED ORDER — ONDANSETRON HCL 4 MG/2ML IJ SOLN: 4.0000 mg | Freq: Four times a day (QID) | INTRAMUSCULAR | Status: DC | PRN

## 2019-07-25 MED ORDER — METOPROLOL TARTRATE 25 MG PO TABS
25.0000 mg | ORAL_TABLET | Freq: Two times a day (BID) | ORAL | Status: DC
  Administered 2019-07-25 – 2019-07-26 (×4): 25 mg via ORAL
  Filled 2019-07-25 (×4): qty 1

## 2019-07-25 MED ORDER — RIVAROXABAN 20 MG PO TABS
20.0000 mg | ORAL_TABLET | Freq: Every day | ORAL | Status: DC
  Administered 2019-07-25 – 2019-07-27 (×3): 20 mg via ORAL
  Filled 2019-07-25 (×3): qty 1

## 2019-07-25 MED ORDER — FUROSEMIDE 10 MG/ML IJ SOLN
40.0000 mg | Freq: Every day | INTRAMUSCULAR | Status: DC
  Administered 2019-07-26: 40 mg via INTRAVENOUS
  Filled 2019-07-25: qty 4

## 2019-07-25 MED ORDER — ACETAMINOPHEN 325 MG PO TABS
650.0000 mg | ORAL_TABLET | ORAL | Status: DC | PRN
  Administered 2019-07-25 – 2019-07-28 (×4): 650 mg via ORAL
  Filled 2019-07-25 (×3): qty 2

## 2019-07-25 NOTE — H&P (Signed)
Cardiology Admission History and Physical:   Patient ID: Alan [name]; MRN: [id number]; DOB: October 14, 1950   Admission date: 07/25/2019  Primary Care Provider:  Aurea Graff, PA-C (Inactive) Primary Cardiologist:  Sanda Klein, MD   Chief Complaint:    Chest pain  History of Present Illness:   Alan [name] is a 69 y.o. male with a very complicated history of cardiac interventions and surgeries who now presents to the hospital with complaints of chest tightness last night. He also has atrial flutter, vertigo, migraines, memory difficulties, hyperlipidemia, prostate CA, anemia, and depression/anxiety.  The chest discomfort is described as intermittent over the past 5 days (waxing and waning). It was pressure-like. No associated nausea, vomiting or diaphoresis. He also describes resting and exertional shortness of breath starting 3 weeks ago and more noticeable in the last 5 days. He also has 2-3 pillow orthopnea and PND. He denies any leg swelling. He is not on a diuretic. He also admits to occasionally forgetting to take his ASA.  In the ED a Chest CT angio ruled out a PE. There was a small to moderate right and small left pleural effusion. CXR showed diffuse interstitial edema and trace left pleural effusion. BNP = 458.3  ECG shows atrial flutter (2:1 conduction), rate 116 bpm, T wave abnormalities in V6, III and aVF. Mild ST depressions in V5-6, slight elevation in aVR. High-sensitivity troponin 36. Second trop pending.   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 Cardiac cath: Stents to LPDA and OM1, LIMA noted to be patent. -Aug 2020: myocardial perfusion scan - no defects, Echo LVEF 60-65%, LAE, aortic root 7mm     Past Medical History:  Diagnosis Date  . 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 Coronary artery disease involving native coronary artery without angina pectoris 10/23/2014  . 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 Enterococcal bacteremia 10/19/2014  . Hepatitis   . Memory difficulty 02/06/2017  . Migraine 09/12/2015  . 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 Paroxysmal atrial fibrillation ([place]) 10/19/2014  . Paroxysmal atrial flutter ([place]) 10/19/2014  . Prostate cancer ([place])   . Protein-calorie malnutrition, severe ([place])   . Restless leg syndrome   . 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/2014   23 mm [facility] Ease bovine pericardial bioprosthetic tissue valve with bovine pericardial patch enlargement of aortic root  . 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 S/P mitral valve repair 11/01/2014   Debridement of vegetation on anterior leaflet  . 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 AORTIC ROOT ENLARGEMENT N/A 11/01/2014   Procedure: AORTIC ROOT ENLARGEMENT;  Surgeon: Rexene Alberts, MD;  Location: [place];  Service: Open Heart Surgery;  Laterality: N/A;  . AORTIC VALVE REPLACEMENT N/A 11/01/2014   Procedure: AORTIC VALVE REPLACEMENT (AVR);  Surgeon: Rexene Alberts, MD;  Location: [facility]  OR;  Service: Open Heart Surgery;  Laterality: N/A;  . CARDIAC CATHETERIZATION N/A 10/23/2014   Procedure: Right/Left Heart Cath and Coronary Angiography;  Surgeon: Lorretta Harp, MD;  Location: [place] CV LAB;   Service: Cardiovascular;  Laterality: N/A;  . CARDIOVERSION N/A 12/18/2014   Procedure: CARDIOVERSION;  Surgeon: Pixie Casino, MD;  Location: [facility] ENDOSCOPY;  Service: Cardiovascular;  Laterality: N/A;  . CARDIOVERSION N/A 02/06/2015   Procedure: CARDIOVERSION;  Surgeon: Larey Dresser, MD;  Location: [place];  Service: Cardiovascular;  Laterality: N/A;  . CARDIOVERSION N/A 04/07/2018   Procedure: CARDIOVERSION;  Surgeon: Sanda Klein, MD;  Location: [facility] ENDOSCOPY;  Service: Cardiovascular;  Laterality: N/A;  . COLONOSCOPY N/A 10/25/2014   Procedure: COLONOSCOPY;  Surgeon: Arta Silence, MD;  Location: [facility] ENDOSCOPY;  Service: Endoscopy;  Laterality: N/A;  . CORONARY ARTERY BYPASS GRAFT N/A 11/01/2014   Procedure: CORONARY ARTERY BYPASS GRAFTING (CABG) x 1 using internal mammary artery;  Surgeon: Rexene Alberts, MD;  Location: [place];  Service: Open Heart Surgery;  Laterality: N/A;  . CORONARY CTO INTERVENTION N/A 04/12/2018   Procedure: CORONARY CTO INTERVENTION;  Surgeon: [name], MD;  Location: [place] CV LAB;  Service: Cardiovascular;  Laterality: N/A;  . CORONARY STENT INTERVENTION N/A 04/12/2018   Procedure: CORONARY STENT INTERVENTION;  Surgeon: [name], MD;  Location: [place] CV LAB;  Service: Cardiovascular;  Laterality: N/A;  . MITRAL VALVE REPAIR N/A 11/01/2014   Procedure: MITRAL VALVE REPAIR with no Ring;  Surgeon: Rexene Alberts, MD;  Location: [place];  Service: Open Heart Surgery;  Laterality: N/A;  . PROSTATECTOMY    . RIGHT/LEFT HEART CATH AND CORONARY/GRAFT ANGIOGRAPHY N/A 04/12/2018   Procedure: RIGHT/LEFT HEART CATH AND CORONARY/GRAFT ANGIOGRAPHY;  Surgeon: [name], MD;  Location: [place] CV LAB;  Service: Cardiovascular;  Laterality: N/A;  . TEE WITHOUT CARDIOVERSION N/A 10/20/2014   Procedure: TRANSESOPHAGEAL ECHOCARDIOGRAM (TEE);  Surgeon: Josue Hector, MD;  Location: [place];  Service: Cardiovascular;  Laterality: N/A;  . TEE  WITHOUT CARDIOVERSION N/A 11/01/2014   Procedure: TRANSESOPHAGEAL ECHOCARDIOGRAM (TEE);  Surgeon: Rexene Alberts, MD;  Location: [place];  Service: Open Heart Surgery;  Laterality: N/A;  . TEE WITHOUT CARDIOVERSION N/A 04/08/2018   Procedure: TRANSESOPHAGEAL ECHOCARDIOGRAM (TEE);  Surgeon: Jerline Pain, MD;  Location: [facility] ENDOSCOPY;  Service: Cardiovascular;  Laterality: N/A;     Medications Prior to Admission: Prior to Admission medications   Medication Sig Start Date End Date Taking? Authorizing Provider  atorvastatin (LIPITOR) 40 MG tablet Take 1 tablet (40 mg total) by mouth daily. 10/07/18 07/24/28 Yes Croitoru, Mihai, MD  Coenzyme Q10-Fish Oil-Vit E (CO-Q 10 OMEGA-3 FISH OIL) CAPS Take 1 capsule by mouth daily.   Yes [provider]  gabapentin (NEURONTIN) 300 MG capsule Take 300 mg by mouth 2 (two) times daily. at dinner and at bedtime   Yes [provider]  Multiple Vitamins-Minerals (PRESERVISION AREDS 2) CAPS Take 1 capsule by mouth every evening.    Yes [provider]  potassium chloride SA (KLOR-CON) 20 MEQ tablet Take 1 tablet (20 mEq total) by mouth daily. 05/10/19  Yes Kilroy, Doreene Burke, PA-C  Probiotic Product (PROBIOTIC PO) Take 1 capsule by mouth daily.   Yes [provider]  Turmeric 500 MG CAPS Take 500 mg by mouth every evening.   Yes [provider]  furosemide (LASIX) 20 MG tablet Take 1 tablet (20 mg total) by mouth every other day. Patient not taking: Reported  on 07/25/2019 06/14/19   Croitoru, Dani Gobble, MD     Allergies:    Allergies  Allergen Reactions  . Pollen Extract Other (See Comments)    sneezing    Social History:   Social History   Socioeconomic History  . Marital status: Married    Spouse name: Not on file  . Number of children: 1  . Years of education: Master's  . Highest education level: Not on file  Occupational History  . Occupation: Books[employer]    Employer: [employer]  Tobacco Use  . Smoking status: Former  Research scientist (life sciences)  . Smokeless tobacco: Never Used  . Tobacco comment: Smoked a pipe age 74->28  Substance and Sexual Activity  . Alcohol use: No    Alcohol/week: 0.0 standard drinks    Comment: Drank from age 52->38  . Drug use: No  . Sexual activity: Not on file  Other Topics Concern  . Not on file  Social History Narrative   Lives at home w/ his wife   Right-handed   Caffeine: 2 cups daily   Social Determinants of Health   Financial Resource Strain:   . Difficulty of Paying Living Expenses:   Food Insecurity:   . Worried About Charity fundraiser in the Last Year:   . Arboriculturist in the Last Year:   Transportation Needs:   . Film/video editor (Medical):   Marland Kitchen Lack of Transportation (Non-Medical):   Physical Activity:   . Days of Exercise per Week:   . Minutes of Exercise per Session:   Stress:   . Feeling of Stress :   Social Connections:   . Frequency of Communication with Friends and Family:   . Frequency of Social Gatherings with Friends and Family:   . Attends Religious Services:   . Active Member of Clubs or Organizations:   . Attends Archivist Meetings:   Marland Kitchen Marital Status:   Intimate Partner Violence:   . Fear of Current or Ex-Partner:   . Emotionally Abused:   Marland Kitchen Physically Abused:   . Sexually Abused:      Family History:   The patient's family history includes Stroke in his father.     Review of Systems: [y] = yes, [ ]  = no   . General: Weight gain [ ] ; Weight loss [ ] ; Anorexia [ ] ; Fatigue [ ] ; Fever [ ] ; Chills [ ] ; Weakness [ ]   . Cardiac: Chest pain/pressure [Y]; Resting SOB [Y]; Exertional SOB [Y]; Orthopnea [ ] ; Pedal Edema [ ] ; Palpitations [ ] ; Syncope [ ] ; Presyncope [ ] ; Paroxysmal nocturnal dyspnea[ ]   . Pulmonary: Cough [ ] ; Wheezing[ ] ; Hemoptysis[ ] ; Sputum [ ] ; Snoring [ ]   . GI: Vomiting[ ] ; Dysphagia[ ] ; Melena[ ] ; Hematochezia [ ] ; Heartburn[ ] ; Abdominal pain [ ] ; Constipation [ ] ; Diarrhea [ ] ; BRBPR [ ]   . GU: Hematuria[  ]; Dysuria [ ] ; Nocturia[ ]   . Vascular: Pain in legs with walking [ ] ; Pain in feet with lying flat [ ] ; Non-healing sores [ ] ; Stroke [ ] ; TIA [ ] ; Slurred speech [ ] ;  . Neuro: Headaches[ ] ; Vertigo[ ] ; Seizures[ ] ; Paresthesias[ ] ;Blurred vision [ ] ; Diplopia [ ] ; Vision changes [ ]   . Ortho/Skin: Arthritis [ ] ; Joint pain [ ] ; Muscle pain [ ] ; Joint swelling [ ] ; Back Pain [ ] ; Rash [ ]   . Psych: Depression[ ] ; Anxiety[ ]   . Heme: Bleeding problems [ ] ; Clotting disorders [ ] ; Anemia [ ]   .  Endocrine: Diabetes [ ] ; Thyroid dysfunction[ ]      Physical Exam/Data:   Vitals:   07/25/19 0141 07/25/19 0143 07/25/19 0145  BP:  (!) 159/119 (!) 152/113  Pulse:  (!) 116 (!) 116  Resp:  18 18  Temp:  97.6 F (36.4 C)   TempSrc:  Oral   SpO2: 98% 95% 97%   No intake or output data in the 24 hours ending 07/25/19 0436 There were no vitals filed for this visit. There is no height or weight on file to calculate BMI.  General:  Ill-appearing HEENT: normal Lymph: no adenopathy Neck: elevated JVP Endocrine:  No thryomegaly Vascular: No carotid bruits; FA pulses 2+ bilaterally without bruits  Cardiac:  Tachycardiac, regular Lungs:  Tachypneic, mild crackles bilaterally  Abd: soft, nontender, no hepatomegaly  Ext: no edema Musculoskeletal:  No deformities, BUE and BLE strength normal and equal Skin: warm and dry  Neuro:  CNs 2-12 intact, no focal abnormalities noted Psych:  Normal affect    Laboratory Data:  Chemistry Recent Labs  Lab 07/25/19 0202  NA 141  K 4.6  CL 110  CO2 20*  GLUCOSE 102*  BUN 15  CREATININE 1.04  CALCIUM 9.4  GFRNONAA >60  GFRAA >60  ANIONGAP 11    Recent Labs  Lab 07/25/19 0202  PROT 6.4*  ALBUMIN 3.5  AST 36  ALT 26  ALKPHOS 56  BILITOT 1.1   Hematology Recent Labs  Lab 07/25/19 0202  WBC 6.7  RBC 5.07  HGB 13.6  HCT 43.5  MCV 85.8  MCH 26.8  MCHC 31.3  RDW 16.0*  PLT 213   Cardiac EnzymesNo results for input(s): TROPONINI in  the last 168 hours. No results for input(s): TROPIPOC in the last 168 hours.  BNP Recent Labs  Lab 07/25/19 0202  BNP 458.3*    DDimer No results for input(s): DDIMER in the last 168 hours.  Radiology/Studies:  CT Angio Chest PE W and/or Wo Contrast  Result Date: 07/25/2019 CLINICAL DATA:  Shortness of breath history of aortic valve replacement EXAM: CT ANGIOGRAPHY CHEST WITH CONTRAST TECHNIQUE: Multidetector CT imaging of the chest was performed using the standard protocol during bolus administration of intravenous contrast. Multiplanar CT image reconstructions and MIPs were obtained to evaluate the vascular anatomy. CONTRAST:  175mL OMNIPAQUE IOHEXOL 350 MG/ML SOLN COMPARISON:  Radiograph same day FINDINGS: Cardiovascular: There is a optimal opacification of the pulmonary arteries. There is no central,segmental, or subsegmental filling defects within the pulmonary arteries. There is mild cardiomegaly. Prosthetic aortic valve is seen. No pericardial effusion or thickening. No evidence right heart strain. There is normal three-vessel brachiocephalic anatomy without proximal stenosis. Scattered mild aortic atherosclerosis is noted. Mediastinum/Nodes: No hilar, mediastinal, or axillary adenopathy. Thyroid gland, trachea, and esophagus demonstrate no significant findings. Lungs/Pleura: Small bilateral pleural effusions are present, right greater than left. Mild hazy airspace opacity seen throughout both lungs with mildly increased interlobular septal thickening at both lung bases. Upper Abdomen: No acute abnormalities present in the visualized portions of the upper abdomen. Musculoskeletal: No chest wall abnormality. No acute or significant osseous findings. Overlying median sternotomy wires are present. Review of the MIP images confirms the above findings. IMPRESSION: Small to moderate right and small left pleural effusion with findings suggestive of diffuse mild pulmonary edema. No central, segmental, or  subsegmental pulmonary embolism. Electronically Signed   By: Prudencio Pair M.D.   On: 07/25/2019 03:50   DG Chest Portable 1 View  Result Date: 07/25/2019 CLINICAL DATA:  Chest pain EXAM: PORTABLE CHEST 1 VIEW COMPARISON:  December 11, 2014 FINDINGS: There is mild cardiomegaly. Overlying median sternotomy wires and prosthetic aortic valve is seen. There is prominence of the central pulmonary vasculature with mildly increased interstitial markings seen throughout. There is a probable trace left pleural effusion present. No acute osseous abnormality. IMPRESSION: Findings suggestive of diffuse interstitial edema and trace left pleural effusion Electronically Signed   By: Prudencio Pair M.D.   On: 07/25/2019 02:15    Assessment and Plan:   1. Chest pain ECG shows atrial flutter (2:1 conduction), rate 116 bpm, T wave abnormalities in V6, III and aVF. Mild ST depressions in V5-6, slight elevation in aVR. High-sensitivity troponin 36. Second trop pending.  -Continue to trend cardiac enzymes -Serial ECGs -ASA daily -Will hold off on starting IV heparin till second troponin comes back -High dose statins -Check a lipid panel -Repeat transthoracic echo  2. Shortness of breath / bilateral pleural effusions Chest CT angio rules out a PE. There is a small to moderate right and small left pleural effusion. CXR shows diffuse interstitial edema and trace left pleural effusion. BNP = 458.3  -Strict I and Os -Daily weights -continue diuresis with IV lasix  3. Atrial flutter with 2:1 AV conduction Not on anticoagulation due to prior surgical LAA clipping in 2016. Previous successful cardioversion for atrial flutter. Now comes with recurrent atrial flutter with 2:1 conduction.   -Rate control with AV nodal blockers -If soft BP, can consider amiodarone versus low dose BB  4. Prior AVR (bioprosthesis) and MV repair  -Repeat echocardiogram    Severity of Illness: The appropriate patient status for this  patient is INPATIENT. Inpatient status is judged to be reasonable and necessary in order to provide the required intensity of service to ensure the patient's safety. The patient's presenting symptoms, physical exam findings, and initial radiographic and laboratory data in the context of their chronic comorbidities is felt to place them at high risk for further clinical deterioration. Furthermore, it is not anticipated that the patient will be medically stable for discharge from the hospital within 2 midnights of admission. The following factors support the patient status of inpatient.   " The patient's presenting symptoms include chest pain. " The worrisome physical exam findings include tachycardia. " The initial radiographic and laboratory data are worrisome because of abnormal ECG. " The chronic co-morbidities include atrial flutter and hyperlipidemia.   * I certify that at the point of admission it is my clinical judgment that the patient will require inpatient hospital care spanning beyond 2 midnights from the point of admission due to high intensity of service, high risk for further deterioration and high frequency of surveillance required.*    For questions or updates, please contact [place] Please consult [email or web address] for contact info under Cardiology/STEMI.    Signed, Meade Maw, MD  07/25/2019 4:36 AM

## 2019-07-25 NOTE — ED Provider Notes (Signed)
[facility] EMERGENCY DEPARTMENT Provider Note   CSN: [id number] Arrival date & time: 07/25/19  0134     History Chief Complaint  Patient presents with  . Chest Pain    Alan [name] is a 69 y.o. 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  The patient reports that he has been having progressively worsening, intermittent shortness of breath accompanied by nonradiating chest tightness and pressure intermittently for the last 4 to 5 days.  Symptoms have been brought on with exertion and improved at rest states that he Facetimes with his daughter every night and she has commented on pulsations on the right side of his neck for the last couple of days.  Tonight, the patient awoke from sleep with worsening chest pain and shortness of breath and states "I feel like I'm going to die."  Chest pain or shortness of breath have been constant since onset tonight and have been present with both rest and exertion.  He denies leg swelling, palpitations, PND, worsening orthopnea, nausea, vomiting, diaphoresis, fever, chills, cough, abdominal pain, diarrhea, back pain, numbness, weakness, neck pain or stiffness.  States that he sleeps with 2-3 pillows at night and has not had to increase this recently.  He does not take a diuretic at home.  He reports that he is not always compliant with his home ASA.  He also notes worsening restless leg symptoms recently.  He had a 90% stenosis in the distal segment of the left PDA that was not bypassed.  He had a cardiac catheterization in 2020 followed by placement of stents in the  native LAD upstream of the LIMA bypass in the left circumflex coronary artery.  He is on 81 mg aspirin daily.  He has a history of atrial flutter s/p cardioversion.  Reports that he has been fully vaccinated against COVID-19 for more than 2 months.  Believes that he was given the Materna vaccine.  Cardiologist: Dr. Sallyanne Kuster  The history is provided by the patient and medical records. No language interpreter was used.       Past Medical History:  Diagnosis Date  . 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 Coronary artery disease involving native coronary artery without angina pectoris 10/23/2014  . 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 Enterococcal bacteremia 10/19/2014  . Hepatitis   . Memory difficulty 02/06/2017  . Migraine 09/12/2015  . 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 Paroxysmal atrial fibrillation ([place]) 10/19/2014  . Paroxysmal atrial flutter ([place]) 10/19/2014  . Prostate cancer ([place])   . Protein-calorie malnutrition, severe ([place])   . Restless leg syndrome   . 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/2014   23 mm [facility] Ease bovine pericardial bioprosthetic tissue valve with bovine pericardial patch enlargement of aortic root  . 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 S/P mitral valve repair 11/01/2014   Debridement of vegetation on anterior leaflet  . 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 Chest pain 07/25/2019  . Dyspnea 05/10/2019  . Acute diastolic CHF (congestive heart failure) ([place]) 04/13/2018  . Status post ligation of left atrial appendage 03/20/2018  . H/O mitral valve repair 03/20/2018  . History of aortic valve replacement with bioprosthetic valve 03/20/2018  . Coronary artery disease involving native coronary artery of  native heart with angina pectoris ([place]) 03/20/2018  . Recurrent major depressive disorder ([place]) 03/20/2018  . Memory difficulty 02/06/2017  . Migraine 09/12/2015  . Vertigo 09/12/2015  . Atrial flutter ([place])   . Typical atrial flutter ([place])   . Long term current use of anticoagulant therapy 11/17/2014  . 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/2014  . 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  . S/P mitral valve repair 11/01/2014  . Dental anomaly   . 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 History of endocarditis 2016   . Protein-calorie malnutrition, severe ([place])   . CAD S/P percutaneous coronary angioplasty 10/23/2014  . Septic embolism ([place])   . Leukocytosis   . Troponin level elevated   . Atherosclerotic peripheral vascular disease ([place]) 10/20/2014  . Aortic valve insufficiency, severe 10/20/2014  . Mitral regurgitation 10/20/2014  . History of stroke 10/20/2014  . Enterococcal bacteremia 10/19/2014  . 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 Paroxysmal atrial fibrillation ([place]) 10/19/2014  . Atrial flutter with rapid ventricular response ([place]) 10/19/2014  . Acute encephalopathy 10/18/2014  . Rash 10/18/2014  . Normocytic anemia 10/18/2014  . Dizziness 10/18/2014  . Prostate cancer ([place]) 07/23/2011  . HTN (hypertension) 08/16/2008  . Headache 08/16/2008  . Depression with anxiety 06/29/2008  . Restless legs syndrome (RLS) 06/21/2007  . Allergic rhinitis 06/21/2007  . Attention deficit disorder 06/21/2007    Past Surgical History:  Procedure Laterality Date  . AORTIC ROOT ENLARGEMENT N/A 11/01/2014   Procedure: AORTIC ROOT ENLARGEMENT;  Surgeon: Rexene Alberts, MD;  Location: [place];  Service: Open Heart Surgery;  Laterality: N/A;  . AORTIC VALVE REPLACEMENT N/A 11/01/2014   Procedure: AORTIC VALVE REPLACEMENT (AVR);  Surgeon: Rexene Alberts, MD;  Location: [place];  Service: Open Heart Surgery;   Laterality: N/A;  . CARDIAC CATHETERIZATION N/A 10/23/2014   Procedure: Right/Left Heart Cath and Coronary Angiography;  Surgeon: Lorretta Harp, MD;  Location: [place] CV LAB;  Service: Cardiovascular;  Laterality: N/A;  . CARDIOVERSION N/A 12/18/2014   Procedure: CARDIOVERSION;  Surgeon: Pixie Casino, MD;  Location: [facility] ENDOSCOPY;  Service: Cardiovascular;  Laterality: N/A;  . CARDIOVERSION N/A 02/06/2015   Procedure: CARDIOVERSION;  Surgeon: Larey Dresser, MD;  Location: [place];  Service: Cardiovascular;  Laterality: N/A;  . CARDIOVERSION N/A 04/07/2018   Procedure: CARDIOVERSION;  Surgeon: Sanda Klein, MD;  Location: [facility] ENDOSCOPY;  Service: Cardiovascular;  Laterality: N/A;  . COLONOSCOPY N/A 10/25/2014   Procedure: COLONOSCOPY;  Surgeon: Arta Silence, MD;  Location: [facility] ENDOSCOPY;  Service: Endoscopy;  Laterality: N/A;  . CORONARY ARTERY BYPASS GRAFT N/A 11/01/2014   Procedure: CORONARY ARTERY BYPASS GRAFTING (CABG) x 1 using internal mammary artery;  Surgeon: Rexene Alberts, MD;  Location: [place];  Service: Open Heart Surgery;  Laterality: N/A;  . CORONARY CTO INTERVENTION N/A 04/12/2018   Procedure: CORONARY CTO INTERVENTION;  Surgeon: [name], MD;  Location: [place] CV LAB;  Service: Cardiovascular;  Laterality: N/A;  . CORONARY STENT INTERVENTION N/A 04/12/2018   Procedure: CORONARY STENT INTERVENTION;  Surgeon: [name], MD;  Location: [facility]  INVASIVE CV LAB;  Service: Cardiovascular;  Laterality: N/A;  . MITRAL VALVE REPAIR N/A 11/01/2014   Procedure: MITRAL VALVE REPAIR with no Ring;  Surgeon: Rexene Alberts, MD;  Location: [place];  Service: Open Heart Surgery;  Laterality: N/A;  . PROSTATECTOMY    . RIGHT/LEFT HEART CATH AND CORONARY/GRAFT ANGIOGRAPHY N/A 04/12/2018   Procedure: RIGHT/LEFT HEART CATH AND CORONARY/GRAFT ANGIOGRAPHY;  Surgeon: [name], MD;  Location: [place] CV LAB;  Service: Cardiovascular;  Laterality: N/A;  . TEE WITHOUT  CARDIOVERSION N/A 10/20/2014   Procedure: TRANSESOPHAGEAL ECHOCARDIOGRAM (TEE);  Surgeon: Josue Hector, MD;  Location: [place];  Service: Cardiovascular;  Laterality: N/A;  . TEE WITHOUT CARDIOVERSION N/A 11/01/2014   Procedure: TRANSESOPHAGEAL ECHOCARDIOGRAM (TEE);  Surgeon: Rexene Alberts, MD;  Location: [place];  Service: Open Heart Surgery;  Laterality: N/A;  . TEE WITHOUT CARDIOVERSION N/A 04/08/2018   Procedure: TRANSESOPHAGEAL ECHOCARDIOGRAM (TEE);  Surgeon: Jerline Pain, MD;  Location: [facility] ENDOSCOPY;  Service: Cardiovascular;  Laterality: N/A;       Family History  Problem Relation Age of Onset  . Stroke Father     Social History   Tobacco Use  . Smoking status: Former Research scientist (life sciences)  . Smokeless tobacco: Never Used  . Tobacco comment: Smoked a pipe age 63->28  Substance Use Topics  . Alcohol use: No    Alcohol/week: 0.0 standard drinks    Comment: Drank from age 79->38  . Drug use: No    Home Medications Prior to Admission medications   Medication Sig Start Date End Date Taking? Authorizing Provider  atorvastatin (LIPITOR) 40 MG tablet Take 1 tablet (40 mg total) by mouth daily. 10/07/18 07/24/28 Yes Croitoru, Mihai, MD  Coenzyme Q10-Fish Oil-Vit E (CO-Q 10 OMEGA-3 FISH OIL) CAPS Take 1 capsule by mouth daily.   Yes [provider]  gabapentin (NEURONTIN) 300 MG capsule Take 300 mg by mouth 2 (two) times daily. at dinner and at bedtime   Yes [provider]  Multiple Vitamins-Minerals (PRESERVISION AREDS 2) CAPS Take 1 capsule by mouth every evening.    Yes [provider]  potassium chloride SA (KLOR-CON) 20 MEQ tablet Take 1 tablet (20 mEq total) by mouth daily. 05/10/19  Yes Kilroy, Doreene Burke, PA-C  Probiotic Product (PROBIOTIC PO) Take 1 capsule by mouth daily.   Yes [provider]  Turmeric 500 MG CAPS Take 500 mg by mouth every evening.   Yes [provider]  furosemide (LASIX) 20 MG tablet Take 1 tablet (20 mg total) by mouth  every other day. Patient not taking: Reported on 07/25/2019 06/14/19   Croitoru, Dani Gobble, MD    Allergies    Pollen extract  Review of Systems   Review of Systems  Constitutional: Negative for appetite change, chills, diaphoresis and fever.  HENT: Negative for congestion and sore throat.   Eyes: Negative for visual disturbance.  Respiratory: Positive for chest tightness and shortness of breath. Negative for cough and wheezing.   Cardiovascular: Positive for chest pain. Negative for palpitations and leg swelling.  Gastrointestinal: Negative for abdominal pain, blood in stool, diarrhea, nausea and vomiting.  Genitourinary: Negative for dysuria, flank pain, frequency, hematuria, penile swelling, scrotal swelling and testicular pain.  Musculoskeletal: Negative for arthralgias, back pain, gait problem, myalgias, neck pain and neck stiffness.  Skin: Negative for rash.  Allergic/Immunologic: Negative for immunocompromised state.  Neurological: Negative for dizziness, seizures, syncope, weakness, numbness and headaches.  Psychiatric/Behavioral: Negative for confusion.    Physical Exam Updated  Vital Signs BP (!) 144/107   Pulse (!) 111   Temp 97.9 F (36.6 C)   Resp 18   Ht 6' (1.829 m)   Wt 79.9 kg   SpO2 94%   BMI 23.89 kg/m   Physical Exam Vitals and nursing note reviewed.  Constitutional:      General: He is not in acute distress.    Appearance: He is well-developed. He is not toxic-appearing or diaphoretic.  HENT:     Head: Normocephalic.     Mouth/Throat:     Mouth: Mucous membranes are moist.  Eyes:     Conjunctiva/sclera: Conjunctivae normal.  Neck:     Comments: JVD on the right Cardiovascular:     Rate and Rhythm: Regular rhythm. Tachycardia present.     Pulses: Normal pulses.     Heart sounds: No murmur. No friction rub. No gallop.      Comments: 2+ radial, DP, and PT pulses. Pulmonary:     Effort: Pulmonary effort is normal.     Comments: Crackles in the  bilateral bases.  Tachypneic, but no retractions or accessory muscle use. Abdominal:     General: There is no distension.     Palpations: Abdomen is soft. There is no mass.     Tenderness: There is no abdominal tenderness. There is no right CVA tenderness, left CVA tenderness, guarding or rebound.     Hernia: No hernia is present.  Musculoskeletal:     Cervical back: Neck supple.     Right lower leg: No edema.     Left lower leg: No edema.     Comments: No peripheral edema.  Skin:    General: Skin is warm and dry.     Capillary Refill: Capillary refill takes less than 2 seconds.  Neurological:     Mental Status: He is alert.     Comments: Alert and oriented x4.  Moves all 4 extremities spontaneously.  Psychiatric:        Behavior: Behavior normal.     ED Results / Procedures / Treatments   Labs (all labs ordered are listed, but only abnormal results are displayed) Labs Reviewed  CBC - Abnormal; Notable for the following components:      Result Value   RDW 16.0 (*)    All other components within normal limits  COMPREHENSIVE METABOLIC PANEL - Abnormal; Notable for the following components:   CO2 20 (*)    Glucose, Bld 102 (*)    Total Protein 6.4 (*)    All other components within normal limits  BRAIN NATRIURETIC PEPTIDE - Abnormal; Notable for the following components:   B Natriuretic Peptide 458.3 (*)    All other components within normal limits  TROPONIN I (HIGH SENSITIVITY) - Abnormal; Notable for the following components:   Troponin I (High Sensitivity) 36 (*)    All other components within normal limits  TROPONIN I (HIGH SENSITIVITY) - Abnormal; Notable for the following components:   Troponin I (High Sensitivity) 35 (*)    All other components within normal limits  TROPONIN I (HIGH SENSITIVITY) - Abnormal; Notable for the following components:   Troponin I (High Sensitivity) 42 (*)    All other components within normal limits  SARS CORONAVIRUS 2 BY RT PCR (HOSPITAL  ORDER, [place] [facility])  MRSA PCR SCREENING  CBG MONITORING, ED    EKG EKG Interpretation  Date/Time:  Monday Jul 25 2019 01:42:34 EDT Ventricular Rate:  116 PR Interval:  QRS Duration: 87 QT Interval:  307 QTC Calculation: 427 R Axis:   109 Text Interpretation: Sinus or ectopic atrial tachycardia Right axis deviation Repol abnrm suggests ischemia, diffuse leads new RAD, new st depressions in V5-6 all compared to february 2020 Confirmed by Merrily Pew [phone]) on 07/25/2019 2:58:19 AM   Radiology CT Angio Chest PE W and/or Wo Contrast  Result Date: 07/25/2019 CLINICAL DATA:  Shortness of breath history of aortic valve replacement EXAM: CT ANGIOGRAPHY CHEST WITH CONTRAST TECHNIQUE: Multidetector CT imaging of the chest was performed using the standard protocol during bolus administration of intravenous contrast. Multiplanar CT image reconstructions and MIPs were obtained to evaluate the vascular anatomy. CONTRAST:  182mL OMNIPAQUE IOHEXOL 350 MG/ML SOLN COMPARISON:  Radiograph same day FINDINGS: Cardiovascular: There is a optimal opacification of the pulmonary arteries. There is no central,segmental, or subsegmental filling defects within the pulmonary arteries. There is mild cardiomegaly. Prosthetic aortic valve is seen. No pericardial effusion or thickening. No evidence right heart strain. There is normal three-vessel brachiocephalic anatomy without proximal stenosis. Scattered mild aortic atherosclerosis is noted. Mediastinum/Nodes: No hilar, mediastinal, or axillary adenopathy. Thyroid gland, trachea, and esophagus demonstrate no significant findings. Lungs/Pleura: Small bilateral pleural effusions are present, right greater than left. Mild hazy airspace opacity seen throughout both lungs with mildly increased interlobular septal thickening at both lung bases. Upper Abdomen: No acute abnormalities present in the visualized portions of the upper abdomen.  Musculoskeletal: No chest wall abnormality. No acute or significant osseous findings. Overlying median sternotomy wires are present. Review of the MIP images confirms the above findings. IMPRESSION: Small to moderate right and small left pleural effusion with findings suggestive of diffuse mild pulmonary edema. No central, segmental, or subsegmental pulmonary embolism. Electronically Signed   By: Prudencio Pair M.D.   On: 07/25/2019 03:50   DG Chest Portable 1 View  Result Date: 07/25/2019 CLINICAL DATA:  Chest pain EXAM: PORTABLE CHEST 1 VIEW COMPARISON:  December 11, 2014 FINDINGS: There is mild cardiomegaly. Overlying median sternotomy wires and prosthetic aortic valve is seen. There is prominence of the central pulmonary vasculature with mildly increased interstitial markings seen throughout. There is a probable trace left pleural effusion present. No acute osseous abnormality. IMPRESSION: Findings suggestive of diffuse interstitial edema and trace left pleural effusion Electronically Signed   By: Prudencio Pair M.D.   On: 07/25/2019 02:15    Procedures .Critical Care Performed by: Joanne Gavel, PA-C Authorized by: Joanne Gavel, PA-C   Critical care provider statement:    Critical care time (minutes):  40   Critical care time was exclusive of:  Separately billable procedures and treating other patients and teaching time   Critical care was necessary to treat or prevent imminent or life-threatening deterioration of the following conditions:  Cardiac failure   Critical care was time spent personally by me on the following activities:  Ordering and performing treatments and interventions, ordering and review of laboratory studies, ordering and review of radiographic studies, pulse oximetry, re-evaluation of patient's condition, review of old charts, obtaining history from patient or surrogate, discussions with consultants, examination of patient, evaluation of patient's response to treatment and  development of treatment plan with patient or surrogate   I assumed direction of critical care for this patient from another provider in my specialty: no     (including critical care time)  Medications Ordered in ED Medications  aspirin EC tablet 81 mg (has no administration in time range)  nitroGLYCERIN (NITROSTAT) SL tablet 0.4  mg (has no administration in time range)  acetaminophen (TYLENOL) tablet 650 mg (650 mg Oral Given 07/25/19 0712)  ondansetron (ZOFRAN) injection 4 mg (has no administration in time range)  rivaroxaban (XARELTO) tablet 20 mg (has no administration in time range)  metoprolol tartrate (LOPRESSOR) tablet 25 mg (has no administration in time range)  iohexol (OMNIPAQUE) 350 MG/ML injection 100 mL (100 mLs Intravenous Contrast Given 07/25/19 0335)  furosemide (LASIX) injection 20 mg (20 mg Intravenous Given 07/25/19 0602)    ED Course  I have reviewed the triage vital signs and the nursing notes.  Pertinent labs & imaging results that were available during my care of the patient were reviewed by me and considered in my medical decision making (see chart for details).    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  Tachycardic in the 110s and hypertensive on arrival.  Oxygen saturation has been fluctuating from 94 to 98%.  He is mildly tachypneic.  Initially partially reclined at approximately 45 degrees, but dyspnea worsened and that was elevated to 90 degrees.   The patient was seen and independently evaluated by Dr. Dayna Barker, attending physician.  Chest x-ray suggestive of diffuse interstitial edema and there is a trace left pleural effusion.  EKG with new RAD and new ST depressions in V5 and 6, new from previous.  He has a mild respiratory acidosis.  Troponin is elevated at 35.  Repeat troponin is pending.  Given persistent tachycardia, CT PE study was obtained.  Negative for PE, but there were small to moderate right and left pleural effusions suggestive of mild pulmonary edema.  Patient was placed on 2 L nasal cannula as he has declined pain medicine as he states that his chest pain has resolved, but he continues to endorse shortness of breath.  He reports significant improvement in shortness of breath on supplemental oxygen.  Consulted cardiology and Dr. Paticia Stack will come and evaluate the patient.  After he is reviewed EKG, EKG is felt to be atrial flutter with a 2-1 conduction.  He recommends continuing to trend cardiac enzymes, repeating transthoracic echo, and continue diuresis for CHF.  He will accept the patient for admission.  The patient appears reasonably stabilized for admission considering the current resources, flow, and capabilities available in the ED at this time, and I doubt any other [facility] requiring further screening and/or treatment in the ED prior to admission.  Final Clinical Impression(s) / ED Diagnoses Final diagnoses:  Acute on chronic congestive heart failure, unspecified heart failure type [facility])  Atrial flutter, unspecified type [facility])    Rx / DC Orders ED Discharge Orders    None       Joanne Gavel, PA-C 07/25/19 0920    Mesner, Corene Cornea, MD 07/26/19 [phone]

## 2019-07-25 NOTE — ED Triage Notes (Signed)
Pt transported from home by [facility], pt c/o chest pressure x 1 hour, + shob, woke him from sleep. Pt took ASA 325mg , IV est #18 R AC, A & O.

## 2019-07-25 NOTE — ED Provider Notes (Signed)
Medical screening examination/treatment/procedure(s) were conducted as a shared visit with non-physician practitioner(s) and myself.  I personally evaluated the patient during the encounter.  Patient here with 5 days of chest pain and progressively worsening shortness of breath.  Patient never had anything like this before but has extensive cardiac history as documented by the PA.  On my examination patient is tachycardic, tachypneic with oxygen saturation around 94%.  He has mild crackles bilaterally.  Pretty significant JVD, no lower extremity edema.  Complains of worsening restless leg syndrome but no edema that he knows of.  Orthopnea, exertional dyspnea and cough but no productive cough or fever. EKG with new right axis deviation and lateral ST depressions new from previous.  Has had ST elevation in aVR before but not with the same morphology as today. X-ray with diffuse interstitial changes. Patient with ACS versus CHF versus PE.  Will likely need admission.        Guliana Weyandt, Corene Cornea, MD 07/26/19 [phone]

## 2019-07-25 NOTE — Progress Notes (Signed)
  Echocardiogram 2D Echocardiogram has been performed.  Matilde Bash 07/25/2019, 12:46 PM

## 2019-07-25 NOTE — Progress Notes (Addendum)
Patient here from ED. Alert and oriented x 3. Vitals taken, MEWs scored yellow due to elevated heart rate. Patient was admitted for atrial flutter, which he Is currently doing on monitor. Afebrile. MRSA collected and sent to lab. Patient c/o headache 8/10. Tylenol given with small sip of water. Reminded patient that he is to be NPO for possible procedure. IV lasix reported to be given prior to arrival.  C/o "agitation" to bilteral lower extremeties. Says he has had this before and they called it "restless legs." CCMD called and notified of patient's arrival. Currently on 02 at 2L sats 96%. HR 114 at present. B/p taken on both arms to compare, left arm measured 141-104, right arm measured 145/109. Iv to right ac saline locked at this time.   0725- 600cc output thus far.

## 2019-07-25 NOTE — Progress Notes (Signed)
   Patient seen and examined - remains in atrial flutter with 2:1 conduction around 115. Not currently on AVN blocker (although this was mentioned in the overnight fellow note). Start metoprolol tartrate 25 mg BID this am. 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 Recommended to restart anticoagulation peri-procedurally to allow DCCV. He was previously on Xarelto, which we will resume. Will need at least 3 doses for steady state. Also, there is not availability for TEE/DCCV until Wednesday - will try to schedule then. Continue diuresis. May resume diet today.  Pixie Casino, MD, [facility], [place] Director of the Advanced Lipid Disorders &  Cardiovascular Risk Reduction Clinic Diplomate of the American Board of Clinical Lipidology Attending Cardiologist  Direct Dial: [phone]  Fax: [phone]  Website:  [email or web address]

## 2019-07-25 NOTE — ED Notes (Signed)
Pt to CT via stretcher

## 2019-07-26 DIAGNOSIS — R0789 Other chest pain: Secondary | ICD-10-CM

## 2019-07-26 LAB — BASIC METABOLIC PANEL WITH GFR
Anion gap: 8 (ref 5–15)
BUN: 16 mg/dL (ref 8–23)
CO2: 24 mmol/L (ref 22–32)
Calcium: 9.2 mg/dL (ref 8.9–10.3)
Chloride: 107 mmol/L (ref 98–111)
Creatinine, Ser: 1.09 mg/dL (ref 0.61–1.24)
GFR calc Af Amer: >60 mL/min
GFR calc non Af Amer: >60 mL/min
Glucose, Bld: 108 mg/dL — ABNORMAL HIGH (ref 70–99)
Potassium: 4.3 mmol/L (ref 3.5–5.1)
Sodium: 139 mmol/L (ref 135–145)

## 2019-07-26 LAB — PROTIME-INR
INR: 2.4 — ABNORMAL HIGH (ref 0.8–1.2)
Prothrombin Time: 25.2 seconds — ABNORMAL HIGH (ref 11.4–15.2)

## 2019-07-26 MED ORDER — SODIUM CHLORIDE 0.9 % IV SOLN: INTRAVENOUS | Status: DC

## 2019-07-26 NOTE — TOC Benefit Eligibility Note (Signed)
Transition of Care [facility]) Benefit Eligibility Note    Patient Details  Name: Alan [name] MRN: [id number] Date of Birth: 10/19/1950   Medication/Dose: Alveda Reasons 15 MG  BID CO-POAY- $ 445.54   and   XARELTO  20 MG DAILY  CO-PAY- $ 445.54  Covered?: Yes  Tier: 3 Drug  Prescription Coverage Preferred Pharmacy: [facility]  and  [facility]  Spoke with Person/Company/Phone Number:: CEC    @ [employer] # [phone]  Co-Pay: $445.54  Prior Approval: No  Deductible: Unmet(OUT-OF-POCKET-UNMET)  Additional Notes: ELIQUIS  2.5 MF BID  and  ELIQUIS   5 MG BID  : COVER- YES PRIOR APPROVAL- YES # [id number]    Memory Argue Phone Number: 07/26/2019, 4:35 PM

## 2019-07-26 NOTE — Plan of Care (Signed)

## 2019-07-26 NOTE — Progress Notes (Signed)
DAILY PROGRESS NOTE   Patient Name: Alan [name] Date of Encounter: 07/26/2019 Cardiologist: Sanda Klein, MD  Chief Complaint   Breathing better  Patient Profile   69 yo male with atrial flutter and diastolic CHF.  Subjective   Close to even yesterday - plan for higher dose lasix today, although weight has trended down. Creatinine stable. Echo reviewed, LVEF preserved however, there is mild to moderate MR (this was noted in the past) - remains in flutter with variable ventricular response.  Objective   Vitals:   07/25/19 2102 07/25/19 2309 07/26/19 0429 07/26/19 0712  BP: 127/90 106/85 101/76 (!) 124/99  Pulse: (!) 114 (!) 111 (!) 113 (!) 115  Resp:  16 20 17   Temp:  98 F (36.7 C) 98.2 F (36.8 C) 98 F (36.7 C)  TempSrc:  Oral Oral Oral  SpO2:  94% 96% 97%  Weight:   75.9 kg   Height:        Intake/Output Summary (Last 24 hours) at 07/26/2019 0805 Last data filed at 07/26/2019 0429 Gross per 24 hour  Intake 840 ml  Output 850 ml  Net -10 ml   Filed Weights   07/25/19 0722 07/26/19 0429  Weight: 79.9 kg 75.9 kg    Physical Exam   General appearance: alert and no distress Neck: no carotid bruit, no JVD and thyroid not enlarged, symmetric, no tenderness/mass/nodules Lungs: clear to auscultation bilaterally Heart: regular rate and rhythm and tachycardic Abdomen: soft, non-tender; bowel sounds normal; no masses,  no organomegaly Extremities: extremities normal, atraumatic, no cyanosis or edema Pulses: 2+ and symmetric Skin: Skin color, texture, turgor normal. No rashes or lesions Neurologic: Grossly normal Psych: Pleasant  Inpatient Medications    Scheduled Meds: . aspirin EC  81 mg Oral Daily  . furosemide  40 mg Intravenous Daily  . metoprolol tartrate  25 mg Oral BID  . rivaroxaban  20 mg Oral Daily  . traZODone  50 mg Oral QHS    Continuous Infusions:   PRN Meds: acetaminophen, nitroGLYCERIN, ondansetron (ZOFRAN) IV   Labs    Results for orders placed or performed during the hospital encounter of 07/25/19 (from the past 48 hour(s))  Troponin I (High Sensitivity)     Status: Abnormal   Collection Time: 07/25/19  2:02 AM  Result Value Ref Range   Troponin I (High Sensitivity) 36 (H) <18 ng/L    Comment: (NOTE) Elevated high sensitivity troponin I (hsTnI) values and significant  changes across serial measurements may suggest ACS but many other  chronic and acute conditions are known to elevate hsTnI results.  Refer to the "Links" section for chest pain algorithms and additional  guidance. Performed at [facility], [place] [address], [place], [place] [address]   CBC     Status: Abnormal   Collection Time: 07/25/19  2:02 AM  Result Value Ref Range   WBC 6.7 4.0 - 10.5 K/uL   RBC 5.07 4.22 - 5.81 MIL/uL   Hemoglobin 13.6 13.0 - 17.0 g/dL   HCT 43.5 39.0 - 52.0 %   MCV 85.8 80.0 - 100.0 fL   MCH 26.8 26.0 - 34.0 pg   MCHC 31.3 30.0 - 36.0 g/dL   RDW 16.0 (H) 11.5 - 15.5 %   Platelets 213 150 - 400 K/uL   nRBC 0.0 0.0 - 0.2 %    Comment: Performed at [facility], [place] [address], [place], [place] [address]  Comprehensive metabolic panel  Status: Abnormal   Collection Time: 07/25/19  2:02 AM  Result Value Ref Range   Sodium 141 135 - 145 mmol/L   Potassium 4.6 3.5 - 5.1 mmol/L   Chloride 110 98 - 111 mmol/L   CO2 20 (L) 22 - 32 mmol/L   Glucose, Bld 102 (H) 70 - 99 mg/dL    Comment: Glucose reference range applies only to samples taken after fasting for at least 8 hours.   BUN 15 8 - 23 mg/dL   Creatinine, Ser 1.04 0.61 - 1.24 mg/dL   Calcium 9.4 8.9 - 10.3 mg/dL   Total Protein 6.4 (L) 6.5 - 8.1 g/dL   Albumin 3.5 3.5 - 5.0 g/dL   AST 36 15 - 41 U/L   ALT 26 0 - 44 U/L   Alkaline Phosphatase 56 38 - 126 U/L   Total Bilirubin 1.1 0.3 - 1.2 mg/dL   GFR calc non Af Amer >60 >60 mL/min   GFR calc Af Amer >60 >60 mL/min   Anion gap 11 5 - 15    Comment: Performed at [facility], [place] [address] [place], [place] [address]  Brain natriuretic peptide     Status: Abnormal   Collection Time: 07/25/19  2:02 AM  Result Value Ref Range   B Natriuretic Peptide 458.3 (H) 0.0 - 100.0 pg/mL    Comment: Performed at [facility] [address], [place], [place] [address]  CBG monitoring, ED     Status: None   Collection Time: 07/25/19  2:58 AM  Result Value Ref Range   Glucose-Capillary 86 70 - 99 mg/dL    Comment: Glucose reference range applies only to samples taken after fasting for at least 8 hours.  SARS Coronavirus 2 by RT PCR (hospital order, performed in [facility] hospital lab) Nasopharyngeal Nasopharyngeal Swab     Status: None   Collection Time: 07/25/19  3:11 AM   Specimen: Nasopharyngeal Swab  Result Value Ref Range   SARS Coronavirus 2 NEGATIVE NEGATIVE    Comment: (NOTE) SARS-CoV-2 target nucleic acids are NOT DETECTED. The SARS-CoV-2 RNA is generally detectable in upper and lower respiratory specimens during the acute phase of infection. The lowest concentration of SARS-CoV-2 viral copies this assay can detect is 250 copies / mL. A negative result does not preclude SARS-CoV-2 infection and should not be used as the sole basis for treatment or other patient management decisions.  A negative result may occur with improper specimen collection / handling, submission of specimen other than nasopharyngeal swab, presence of viral mutation(s) within the areas targeted by this assay, and inadequate number of viral copies (<250 copies / mL). A negative result must be combined with clinical observations, patient history, and epidemiological information. Fact Sheet for Patients:   StrictlyIdeas.no Fact Sheet for Healthcare Providers: BankingDealers.co.za This test is not yet approved or cleared  by the [place] FDA and has been authorized for detection and/or diagnosis of SARS-CoV-2 by FDA under an  Emergency Use Authorization (EUA).  This EUA will remain in effect (meaning this test can be used) for the duration of the COVID-19 declaration under Section 564(b)(1) of the Act, 21 U.S.C. section 360bbb-3(b)(1), unless the authorization is terminated or revoked sooner. Performed at [facility], [place] [address], [place], [place] [address]   Troponin I (High Sensitivity)     Status: Abnormal   Collection Time: 07/25/19  4:29 AM  Result Value Ref Range   Troponin I (High  Sensitivity) 35 (H) <18 ng/L    Comment: (NOTE) Elevated high sensitivity troponin I (hsTnI) values and significant  changes across serial measurements may suggest ACS but many other  chronic and acute conditions are known to elevate hsTnI results.  Refer to the "Links" section for chest pain algorithms and additional  guidance. Performed at [facility], [place] [address] [place], [place] [address]   MRSA PCR Screening     Status: None   Collection Time: 07/25/19  7:25 AM   Specimen: Nasal Mucosa; Nasopharyngeal  Result Value Ref Range   MRSA by PCR NEGATIVE NEGATIVE    Comment:        The GeneXpert MRSA Assay (FDA approved for NASAL specimens only), is one component of a comprehensive MRSA colonization surveillance program. It is not intended to diagnose MRSA infection nor to guide or monitor treatment for MRSA infections. Performed at [facility], [place] [address] [place], [place] [address]   Troponin I (High Sensitivity)     Status: Abnormal   Collection Time: 07/25/19  7:42 AM  Result Value Ref Range   Troponin I (High Sensitivity) 42 (H) <18 ng/L    Comment: (NOTE) Elevated high sensitivity troponin I (hsTnI) values and significant  changes across serial measurements may suggest ACS but many other  chronic and acute conditions are known to elevate hsTnI results.  Refer to the "Links" section for chest pain algorithms and additional  guidance. Performed at [facility],  [place] [address] [place], [place] [address]   Troponin I (High Sensitivity)     Status: Abnormal   Collection Time: 07/25/19 12:41 PM  Result Value Ref Range   Troponin I (High Sensitivity) 41 (H) <18 ng/L    Comment: (NOTE) Elevated high sensitivity troponin I (hsTnI) values and significant  changes across serial measurements may suggest ACS but many other  chronic and acute conditions are known to elevate hsTnI results.  Refer to the "Links" section for chest pain algorithms and additional  guidance. Performed at [facility], [place] [address] [place], [place] [id number]   Basic metabolic panel     Status: Abnormal   Collection Time: 07/26/19  2:47 AM  Result Value Ref Range   Sodium 139 135 - 145 mmol/L   Potassium 4.3 3.5 - 5.1 mmol/L   Chloride 107 98 - 111 mmol/L   CO2 24 22 - 32 mmol/L   Glucose, Bld 108 (H) 70 - 99 mg/dL    Comment: Glucose reference range applies only to samples taken after fasting for at least 8 hours.   BUN 16 8 - 23 mg/dL   Creatinine, Ser 1.09 0.61 - 1.24 mg/dL   Calcium 9.2 8.9 - 10.3 mg/dL   GFR calc non Af Amer >60 >60 mL/min   GFR calc Af Amer >60 >60 mL/min   Anion gap 8 5 - 15    Comment: Performed at [facility] [address] [place], [place] [address]    ECG   Atrial flutter with 2:1 AV Block - Personally Reviewed  Telemetry   Atrial flutter with variable ventricular response - Personally Reviewed  Radiology    CT Angio Chest PE W and/or Wo Contrast  Result Date: 07/25/2019 CLINICAL DATA:  Shortness of breath history of aortic valve replacement EXAM: CT ANGIOGRAPHY CHEST WITH CONTRAST TECHNIQUE: Multidetector CT imaging of the chest was performed using the standard protocol during bolus administration of intravenous contrast. Multiplanar CT image  reconstructions and MIPs were obtained to evaluate the vascular anatomy. CONTRAST:  162mL OMNIPAQUE IOHEXOL 350 MG/ML SOLN COMPARISON:  Radiograph same day FINDINGS: Cardiovascular:  There is a optimal opacification of the pulmonary arteries. There is no central,segmental, or subsegmental filling defects within the pulmonary arteries. There is mild cardiomegaly. Prosthetic aortic valve is seen. No pericardial effusion or thickening. No evidence right heart strain. There is normal three-vessel brachiocephalic anatomy without proximal stenosis. Scattered mild aortic atherosclerosis is noted. Mediastinum/Nodes: No hilar, mediastinal, or axillary adenopathy. Thyroid gland, trachea, and esophagus demonstrate no significant findings. Lungs/Pleura: Small bilateral pleural effusions are present, right greater than left. Mild hazy airspace opacity seen throughout both lungs with mildly increased interlobular septal thickening at both lung bases. Upper Abdomen: No acute abnormalities present in the visualized portions of the upper abdomen. Musculoskeletal: No chest wall abnormality. No acute or significant osseous findings. Overlying median sternotomy wires are present. Review of the MIP images confirms the above findings. IMPRESSION: Small to moderate right and small left pleural effusion with findings suggestive of diffuse mild pulmonary edema. No central, segmental, or subsegmental pulmonary embolism. Electronically Signed   By: Prudencio Pair M.D.   On: 07/25/2019 03:50   DG Chest Portable 1 View  Result Date: 07/25/2019 CLINICAL DATA:  Chest pain EXAM: PORTABLE CHEST 1 VIEW COMPARISON:  December 11, 2014 FINDINGS: There is mild cardiomegaly. Overlying median sternotomy wires and prosthetic aortic valve is seen. There is prominence of the central pulmonary vasculature with mildly increased interstitial markings seen throughout. There is a probable trace left pleural effusion present. No acute osseous abnormality. IMPRESSION: Findings suggestive of diffuse interstitial edema and trace left pleural effusion Electronically Signed   By: Prudencio Pair M.D.   On: 07/25/2019 02:15   ECHOCARDIOGRAM  COMPLETE  Result Date: 07/25/2019    ECHOCARDIOGRAM REPORT   Patient Name:   Alan [name] Date of Exam: 07/25/2019 Medical Rec #:  [id number]              Height:       72.0 in Accession #:    [id number]             Weight:       176.1 lb Date of Birth:  1950/09/21              BSA:          2.019 m Patient Age:    45 years               BP:           136/97 mmHg Patient Gender: M                      HR:           114 bpm. Exam Location:  Inpatient Procedure: 2D Echo, Cardiac Doppler and Color Doppler Indications:    Chest pain  History:        Patient has prior history of Echocardiogram examinations, most                 recent 10/12/2018. CAD, Prior CABG, Aortic Valve Disease, Mitral                 Valve Disease and AVR, MV repai, Arrythmias:Atrial Fibrillation                 and Atrial Flutter; Risk Factors:Dyslipidemia. Pleural effsuion.  Aortic Valve: 23 mm Edwards MagnaEase valve is present in the                 aortic position.                 Mitral Valve: valve is present in the mitral position.  Sonographer:    Dustin Flock Referring Phys: [id number] [place]  1. Left ventricular ejection fraction, by estimation, is 60 to 65%. The left ventricle has normal function. The left ventricle has no regional wall motion abnormalities. There is mild left ventricular hypertrophy. Left ventricular diastolic function could not be evaluated. Elevated left ventricular end-diastolic pressure.  2. Right ventricular systolic function is normal. The right ventricular size is normal. There is moderately elevated pulmonary artery systolic pressure. The estimated right ventricular systolic pressure is Q000111Q mmHg.  3. Left atrial size was moderately dilated.  4. Right atrial size was mild to moderately dilated.  5. The mitral valve has been repaired/replaced. Mild to moderate mitral valve regurgitation.  6. The tricuspid valve is abnormal. Tricuspid valve regurgitation is  moderate.  7. The aortic valve is tricuspid. Aortic valve regurgitation is not visualized. There is a 23 mm Edwards MagnaEase valve present in the aortic position.  8. The inferior vena cava is dilated in size with <50% respiratory variability, suggesting right atrial pressure of 15 mmHg. FINDINGS  Left Ventricle: Left ventricular ejection fraction, by estimation, is 60 to 65%. The left ventricle has normal function. The left ventricle has no regional wall motion abnormalities. The left ventricular internal cavity size was normal in size. There is  mild left ventricular hypertrophy. Left ventricular diastolic function could not be evaluated due to atrial fibrillation. Left ventricular diastolic function could not be evaluated. Elevated left ventricular end-diastolic pressure. Right Ventricle: The right ventricular size is normal. No increase in right ventricular wall thickness. Right ventricular systolic function is normal. There is moderately elevated pulmonary artery systolic pressure. The tricuspid regurgitant velocity is 3.27 m/s, and with an assumed right atrial pressure of 15 mmHg, the estimated right ventricular systolic pressure is Q000111Q mmHg. Left Atrium: Left atrial size was moderately dilated. Right Atrium: Right atrial size was mild to moderately dilated. Pericardium: There is no evidence of pericardial effusion. Mitral Valve: The mitral valve has been repaired/replaced. There is mild thickening of the mitral valve leaflet(s). Mild to moderate mitral valve regurgitation. There is a present in the mitral position. MV peak gradient, 18.3 mmHg. The mean mitral valve  gradient is 7.0 mmHg. Tricuspid Valve: The tricuspid valve is abnormal. Tricuspid valve regurgitation is moderate. Aortic Valve: The aortic valve is tricuspid. Aortic valve regurgitation is not visualized. There is a 23 mm Edwards MagnaEase valve present in the aortic position. Pulmonic Valve: The pulmonic valve was grossly normal. Pulmonic  valve regurgitation is trivial. Aorta: The aortic root and ascending aorta are structurally normal, with no evidence of dilitation. Venous: The inferior vena cava is dilated in size with less than 50% respiratory variability, suggesting right atrial pressure of 15 mmHg. IAS/Shunts: No atrial level shunt detected by color flow Doppler. Additional Comments: There is a small pleural effusion in the left lateral region.  LEFT VENTRICLE PLAX 2D LVIDd:         4.61 cm  Diastology LVIDs:         2.73 cm  LV e' lateral:   11.90 cm/s LV PW:         1.19 cm  LV E/e' lateral: 14.7  LV IVS:        1.03 cm  LV e' medial:    7.83 cm/s LVOT diam:     2.00 cm  LV E/e' medial:  22.3 LV SV:         33 LV SV Index:   16 LVOT Area:     3.14 cm  RIGHT VENTRICLE RV Basal diam:  3.99 cm RV S prime:     5.66 cm/s TAPSE (M-mode): 2.2 cm LEFT ATRIUM             Index       RIGHT ATRIUM           Index LA diam:        4.80 cm 2.38 cm/m  RA Area:     25.80 cm LA Vol (A2C):   90.8 ml 44.98 ml/m RA Volume:   75.70 ml  37.50 ml/m LA Vol (A4C):   83.6 ml 41.41 ml/m LA Biplane Vol: 92.2 ml 45.67 ml/m  AORTIC VALVE LVOT Vmax:   82.60 cm/s LVOT Vmean:  49.100 cm/s LVOT VTI:    0.104 m  AORTA Ao Root diam: 2.30 cm MITRAL VALVE                TRICUSPID VALVE MV Area (PHT): 4.71 cm     TR Peak grad:   42.8 mmHg MV Peak grad:  18.3 mmHg    TR Vmax:        327.00 cm/s MV Mean grad:  7.0 mmHg MV Vmax:       2.14 m/s     SHUNTS MV Vmean:      117.0 cm/s   Systemic VTI:  0.10 m MV Decel Time: 161 msec     Systemic Diam: 2.00 cm MV E velocity: 175.00 cm/s Lyman Bishop MD Electronically signed by Lyman Bishop MD Signature Date/Time: 07/25/2019/2:50:10 PM    Final     Cardiac Studies   N/A  Assessment   1. Principal Problem: 2.   Atrial flutter with rapid ventricular response ([place]) 3. Active Problems: 4.   Chest pain 5.   Plan   1. Some variable ventricular response overnight, but remains in flutter. HR reasonably controlled - LVEF  preserved. Diuresed some, weight is down. Continue today - creatinine stable. Plan for TEE/DCCV tomorrow after 3rd dose of Xarelto - would continue at least 1 month post-op, then can d/c since he has had LAA closure.  Time Spent Directly with Patient:  I have spent a total of 25 minutes with the patient reviewing hospital notes, telemetry, EKGs, labs and examining the patient as well as establishing an assessment and plan that was discussed personally with the patient.  > 50% of time was spent in direct patient care.  Length of Stay:  LOS: 1 day   Pixie Casino, MD, [facility], [place] Director of the Advanced Lipid Disorders &  Cardiovascular Risk Reduction Clinic Diplomate of the American Board of Clinical Lipidology Attending Cardiologist  Direct Dial: [phone]  Fax: [phone]  Website:  [email or web address] 07/26/2019, 8:05 AM

## 2019-07-27 ENCOUNTER — Encounter (HOSPITAL_COMMUNITY)
Admission: EM | Disposition: A | Discharge status: Home / Self Care | Payer: Self-pay | Source: Home / Self Care | Attending: Cardiology

## 2019-07-27 LAB — BASIC METABOLIC PANEL
Anion gap: 9 (ref 5–15)
BUN: 21 mg/dL (ref 8–23)
CO2: 21 mmol/L — ABNORMAL LOW (ref 22–32)
Calcium: 9.2 mg/dL (ref 8.9–10.3)
Chloride: 105 mmol/L (ref 98–111)
Creatinine, Ser: 1.34 mg/dL — ABNORMAL HIGH (ref 0.61–1.24)
GFR calc Af Amer: >60 mL/min (ref >60)
GFR calc non Af Amer: 53 mL/min — ABNORMAL LOW (ref >60)
Glucose, Bld: 111 mg/dL — ABNORMAL HIGH (ref 70–99)
Potassium: 4.6 mmol/L (ref 3.5–5.1)
Sodium: 135 mmol/L (ref 135–145)

## 2019-07-27 SURGERY — ECHOCARDIOGRAM, TRANSESOPHAGEAL
Anesthesia: Moderate Sedation

## 2019-07-27 MED ORDER — METOPROLOL TARTRATE 12.5 MG HALF TABLET
12.5000 mg | ORAL_TABLET | Freq: Two times a day (BID) | ORAL | Status: DC
  Administered 2019-07-27: 12.5 mg via ORAL
  Filled 2019-07-27: qty 1

## 2019-07-27 NOTE — Anesthesia Preprocedure Evaluation (Addendum)
Anesthesia Evaluation  Patient identified by MRN, date of birth, ID band Patient awake    Reviewed: Allergy & Precautions, NPO status , Patient's Chart, lab work & pertinent test results  Airway Mallampati: II  TM Distance: >3 FB Neck ROM: Full    Dental  (+) Dental Advisory Given, Chipped,    Pulmonary shortness of breath, former smoker,    Pulmonary exam normal breath sounds clear to auscultation       Cardiovascular hypertension, Pt. 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 no ring   Neuro/Psych  Headaches, PSYCHIATRIC DISORDERS Anxiety Depression Restless legs syndrome Poor memory CVA    GI/Hepatic negative GI ROS, (+) Hepatitis -Hx/o splenic infarct   Endo/Other  Hyperlipidemia   Renal/GU negative Renal ROS   Hx/o prostate Ca    Musculoskeletal   Abdominal   Peds  Hematology  (+) anemia , Hx/o anticoagulant use eliquis - last dose 6/3   Anesthesia Other Findings   Reproductive/Obstetrics                          Anesthesia Physical Anesthesia Plan  ASA: III  Anesthesia Plan: MAC and General   Post-op Pain Management:    Induction: Intravenous  PONV Risk Score and Plan: 2 and Propofol infusion, Ondansetron and Treatment may vary due to age or medical condition  Airway Management Planned: Natural Airway and Nasal Cannula  Additional Equipment:   Intra-op Plan:   Post-operative Plan:   Informed Consent: I have reviewed the patients History and Physical, chart, labs and discussed the procedure including the risks, benefits and alternatives for the  proposed anesthesia with the patient or authorized representative who has indicated his/her understanding and acceptance.     Dental advisory given  Plan Discussed with: CRNA and Surgeon  Anesthesia Plan Comments:        Anesthesia Quick Evaluation

## 2019-07-27 NOTE — H&P (View-Only) (Signed)
DAILY PROGRESS NOTE   Patient Name: Alan [name] Date of Encounter: 07/27/2019 Cardiologist: Sanda Klein, MD  Chief Complaint   No complaints  Patient Profile   69 yo male with atrial flutter and diastolic CHF.  Subjective   Not much recorded negative. Weight up 0.3 kg. HR remains elevated in flutter, but stable around 110-115. Unfortunately, could not accommodate TEE/DCCV until tomorrow. BP soft today.  Objective   Vitals:   07/26/19 2154 07/27/19 0000 07/27/19 0355 07/27/19 0459  BP: (!) 122/98 (!) 89/68 109/78   Pulse: (!) 114 (!) 112 (!) 112   Resp:  20 20   Temp:  97.9 F (36.6 C) 97.7 F (36.5 C)   TempSrc:  Oral Oral   SpO2:  97% 97%   Weight:    76.2 kg  Height:        Intake/Output Summary (Last 24 hours) at 07/27/2019 0748 Last data filed at 07/27/2019 0400 Gross per 24 hour  Intake 360 ml  Output 400 ml  Net -40 ml   Filed Weights   07/25/19 0722 07/26/19 0429 07/27/19 0459  Weight: 79.9 kg 75.9 kg 76.2 kg    Physical Exam   General appearance: alert and no distress Neck: no carotid bruit, no JVD and thyroid not enlarged, symmetric, no tenderness/mass/nodules Lungs: clear to auscultation bilaterally Heart: regular rate and rhythm and tachycardic Abdomen: soft, non-tender; bowel sounds normal; no masses,  no organomegaly Extremities: extremities normal, atraumatic, no cyanosis or edema Pulses: 2+ and symmetric Skin: Skin color, texture, turgor normal. No rashes or lesions Neurologic: Grossly normal Psych: Pleasant  Inpatient Medications    Scheduled Meds: . aspirin EC  81 mg Oral Daily  . furosemide  40 mg Intravenous Daily  . metoprolol tartrate  25 mg Oral BID  . rivaroxaban  20 mg Oral Daily  . traZODone  50 mg Oral QHS    Continuous Infusions: . [START ON 07/28/2019] sodium chloride      PRN Meds: acetaminophen, nitroGLYCERIN, ondansetron (ZOFRAN) IV   Labs   Results for orders placed or performed during the hospital  encounter of 07/25/19 (from the past 48 hour(s))  Troponin I (High Sensitivity)     Status: Abnormal   Collection Time: 07/25/19 12:41 PM  Result Value Ref Range   Troponin I (High Sensitivity) 41 (H) <18 ng/L    Comment: (NOTE) Elevated high sensitivity troponin I (hsTnI) values and significant  changes across serial measurements may suggest ACS but many other  chronic and acute conditions are known to elevate hsTnI results.  Refer to the "Links" section for chest pain algorithms and additional  guidance. Performed at [facility], [place] [address] [place], [place] [id number]   Basic metabolic panel     Status: Abnormal   Collection Time: 07/26/19  2:47 AM  Result Value Ref Range   Sodium 139 135 - 145 mmol/L   Potassium 4.3 3.5 - 5.1 mmol/L   Chloride 107 98 - 111 mmol/L   CO2 24 22 - 32 mmol/L   Glucose, Bld 108 (H) 70 - 99 mg/dL    Comment: Glucose reference range applies only to samples taken after fasting for at least 8 hours.   BUN 16 8 - 23 mg/dL   Creatinine, Ser 1.09 0.61 - 1.24 mg/dL   Calcium 9.2 8.9 - 10.3 mg/dL   GFR calc non Af Amer >60 >60 mL/min   GFR calc Af Amer >60 >60 mL/min   Anion gap 8  5 - 15    Comment: Performed at [facility], [place] [address] [place], [place] [address]  Protime-INR     Status: Abnormal   Collection Time: 07/26/19 10:46 PM  Result Value Ref Range   Prothrombin Time 25.2 (H) 11.4 - 15.2 seconds   INR 2.4 (H) 0.8 - 1.2    Comment: (NOTE) INR goal varies based on device and disease states. Performed at [facility], [place] [address] [place], [place] [id number]   Basic metabolic panel     Status: Abnormal   Collection Time: 07/27/19  1:40 AM  Result Value Ref Range   Sodium 135 135 - 145 mmol/L   Potassium 4.6 3.5 - 5.1 mmol/L   Chloride 105 98 - 111 mmol/L   CO2 21 (L) 22 - 32 mmol/L   Glucose, Bld 111 (H) 70 - 99 mg/dL    Comment: Glucose reference range applies only to samples taken after fasting for at least 8  hours.   BUN 21 8 - 23 mg/dL   Creatinine, Ser 1.34 (H) 0.61 - 1.24 mg/dL   Calcium 9.2 8.9 - 10.3 mg/dL   GFR calc non Af Amer 53 (L) >60 mL/min   GFR calc Af Amer >60 >60 mL/min   Anion gap 9 5 - 15    Comment: Performed at [facility] [address], [place],  [address]    ECG   Atrial flutter with 2:1 AV Block - Personally Reviewed  Telemetry   Atrial flutter with variable ventricular response - Personally Reviewed  Radiology    ECHOCARDIOGRAM COMPLETE  Result Date: 07/25/2019    ECHOCARDIOGRAM REPORT   Patient Name:   Alan [name] Date of Exam: 07/25/2019 Medical Rec #:  [id number]              Height:       72.0 in Accession #:    [id number]             Weight:       176.1 lb Date of Birth:  03-Dec-1950              BSA:          2.019 m Patient Age:    50 years               BP:           136/97 mmHg Patient Gender: M                      HR:           114 bpm. Exam Location:  Inpatient Procedure: 2D Echo, Cardiac Doppler and Color Doppler Indications:    Chest pain  History:        Patient has prior history of Echocardiogram examinations, most                 recent 10/12/2018. CAD, Prior CABG, Aortic Valve Disease, Mitral                 Valve Disease and AVR, MV repai, Arrythmias:Atrial Fibrillation                 and Atrial Flutter; Risk Factors:Dyslipidemia. Pleural effsuion.                 Aortic Valve: 23 mm Edwards MagnaEase valve is present in the  aortic position.                 Mitral Valve: valve is present in the mitral position.  Sonographer:    Dustin Flock Referring Phys: [id number] [place]  1. Left ventricular ejection fraction, by estimation, is 60 to 65%. The left ventricle has normal function. The left ventricle has no regional wall motion abnormalities. There is mild left ventricular hypertrophy. Left ventricular diastolic function could not be evaluated. Elevated left ventricular end-diastolic pressure.   2. Right ventricular systolic function is normal. The right ventricular size is normal. There is moderately elevated pulmonary artery systolic pressure. The estimated right ventricular systolic pressure is Q000111Q mmHg.  3. Left atrial size was moderately dilated.  4. Right atrial size was mild to moderately dilated.  5. The mitral valve has been repaired/replaced. Mild to moderate mitral valve regurgitation.  6. The tricuspid valve is abnormal. Tricuspid valve regurgitation is moderate.  7. The aortic valve is tricuspid. Aortic valve regurgitation is not visualized. There is a 23 mm Edwards MagnaEase valve present in the aortic position.  8. The inferior vena cava is dilated in size with <50% respiratory variability, suggesting right atrial pressure of 15 mmHg. FINDINGS  Left Ventricle: Left ventricular ejection fraction, by estimation, is 60 to 65%. The left ventricle has normal function. The left ventricle has no regional wall motion abnormalities. The left ventricular internal cavity size was normal in size. There is  mild left ventricular hypertrophy. Left ventricular diastolic function could not be evaluated due to atrial fibrillation. Left ventricular diastolic function could not be evaluated. Elevated left ventricular end-diastolic pressure. Right Ventricle: The right ventricular size is normal. No increase in right ventricular wall thickness. Right ventricular systolic function is normal. There is moderately elevated pulmonary artery systolic pressure. The tricuspid regurgitant velocity is 3.27 m/s, and with an assumed right atrial pressure of 15 mmHg, the estimated right ventricular systolic pressure is Q000111Q mmHg. Left Atrium: Left atrial size was moderately dilated. Right Atrium: Right atrial size was mild to moderately dilated. Pericardium: There is no evidence of pericardial effusion. Mitral Valve: The mitral valve has been repaired/replaced. There is mild thickening of the mitral valve leaflet(s). Mild to  moderate mitral valve regurgitation. There is a present in the mitral position. MV peak gradient, 18.3 mmHg. The mean mitral valve  gradient is 7.0 mmHg. Tricuspid Valve: The tricuspid valve is abnormal. Tricuspid valve regurgitation is moderate. Aortic Valve: The aortic valve is tricuspid. Aortic valve regurgitation is not visualized. There is a 23 mm Edwards MagnaEase valve present in the aortic position. Pulmonic Valve: The pulmonic valve was grossly normal. Pulmonic valve regurgitation is trivial. Aorta: The aortic root and ascending aorta are structurally normal, with no evidence of dilitation. Venous: The inferior vena cava is dilated in size with less than 50% respiratory variability, suggesting right atrial pressure of 15 mmHg. IAS/Shunts: No atrial level shunt detected by color flow Doppler. Additional Comments: There is a small pleural effusion in the left lateral region.  LEFT VENTRICLE PLAX 2D LVIDd:         4.61 cm  Diastology LVIDs:         2.73 cm  LV e' lateral:   11.90 cm/s LV PW:         1.19 cm  LV E/e' lateral: 14.7 LV IVS:        1.03 cm  LV e' medial:    7.83 cm/s LVOT diam:     2.00  cm  LV E/e' medial:  22.3 LV SV:         33 LV SV Index:   16 LVOT Area:     3.14 cm  RIGHT VENTRICLE RV Basal diam:  3.99 cm RV S prime:     5.66 cm/s TAPSE (M-mode): 2.2 cm LEFT ATRIUM             Index       RIGHT ATRIUM           Index LA diam:        4.80 cm 2.38 cm/m  RA Area:     25.80 cm LA Vol (A2C):   90.8 ml 44.98 ml/m RA Volume:   75.70 ml  37.50 ml/m LA Vol (A4C):   83.6 ml 41.41 ml/m LA Biplane Vol: 92.2 ml 45.67 ml/m  AORTIC VALVE LVOT Vmax:   82.60 cm/s LVOT Vmean:  49.100 cm/s LVOT VTI:    0.104 m  AORTA Ao Root diam: 2.30 cm MITRAL VALVE                TRICUSPID VALVE MV Area (PHT): 4.71 cm     TR Peak grad:   42.8 mmHg MV Peak grad:  18.3 mmHg    TR Vmax:        327.00 cm/s MV Mean grad:  7.0 mmHg MV Vmax:       2.14 m/s     SHUNTS MV Vmean:      117.0 cm/s   Systemic VTI:  0.10 m MV  Decel Time: 161 msec     Systemic Diam: 2.00 cm MV E velocity: 175.00 cm/s Lyman Bishop MD Electronically signed by Lyman Bishop MD Signature Date/Time: 07/25/2019/2:50:10 PM    Final     Cardiac Studies   N/A  Assessment   Principal Problem:   Atrial flutter with rapid ventricular response (HCC) Active Problems:   Chest pain   Plan   1. HR stable -not much urine output with lasix, weight stable if not mildly up. Creatinine bumped -stop lasix today. He was on QOD lasix at home. Mildly hypotensive today - may be dry, will hold am metoprolol, decrease to 12.5 mg BID tonight. Plan for TEE/DCCV tomorrow.  Time Spent Directly with Patient:  I have spent a total of 25 minutes with the patient r eviewing hospital notes, telemetry, EKGs, labs and examining the patient as well as establishing an assessment and plan that was discussed personally with the patient.  > 50% of time was spent in direct patient care.  Length of Stay:  LOS: 2 days   Pixie Casino, MD, [facility], [place] Director of the [facility]  [facility] Diplomate of the American Board of Clinical Lipidology Attending Cardiologist  Direct Dial: [phone]  Fax: [phone]  Website:  [email or web address] 07/27/2019, 7:48 AM

## 2019-07-27 NOTE — Plan of Care (Signed)

## 2019-07-27 NOTE — Discharge Instructions (Addendum)
Low salt diet  Heart healthy   Weigh daily and if weight increases 3 pounds in a day or 5 pounds in a week then call the office, we would increase lasix at that time. For now beginning tomorrow 20 mg daily  We stopped your asprin since you are now on xarelto     Information on my medicine - XARELTO (Rivaroxaban)  This medication education was reviewed with me or my healthcare representative as part of my discharge preparation.   Why was Xarelto prescribed for you? Xarelto was prescribed for you to reduce the risk of a blood clot forming that can cause a stroke if you have a medical condition called atrial fibrillation (a type of irregular heartbeat).  What do you need to know about xarelto ? Take your Xarelto ONCE DAILY at the same time every day with your evening meal. If you have difficulty swallowing the tablet whole, you may crush it and mix in applesauce just prior to taking your dose.  Take Xarelto exactly as prescribed by your doctor and DO NOT stop taking Xarelto without talking to the doctor who prescribed the medication.  Stopping without other stroke prevention medication to take the place of Xarelto may increase your risk of developing a clot that causes a stroke.  Refill your prescription before you run out.  After discharge, you should have regular check-up appointments with your healthcare provider that is prescribing your Xarelto.  In the future your dose may need to be changed if your kidney function or weight changes by a significant amount.  What do you do if you miss a dose? If you are taking Xarelto ONCE DAILY and you miss a dose, take it as soon as you remember on the same day then continue your regularly scheduled once daily regimen the next day. Do not take two doses of Xarelto at the same time or on the same day.   Important Safety Information A possible side effect of Xarelto is bleeding. You should call your healthcare provider right away if you  experience any of the following: ? Bleeding from an injury or your nose that does not stop. ? Unusual colored urine (red or dark brown) or unusual colored stools (red or black). ? Unusual bruising for unknown reasons. ? A serious fall or if you hit your head (even if there is no bleeding).  Some medicines may interact with Xarelto and might increase your risk of bleeding while on Xarelto. To help avoid this, consult your healthcare provider or pharmacist prior to using any new prescription or non-prescription medications, including herbals, vitamins, non-steroidal anti-inflammatory drugs (NSAIDs) and supplements.  This website has more information on Xarelto: [email or web address]

## 2019-07-27 NOTE — Progress Notes (Signed)
DAILY PROGRESS NOTE   Patient Name: Alan [name] Date of Encounter: 07/27/2019 Cardiologist: Sanda Klein, MD  Chief Complaint   No complaints  Patient Profile   69 yo male with atrial flutter and diastolic CHF.  Subjective   Not much recorded negative. Weight up 0.3 kg. HR remains elevated in flutter, but stable around 110-115. Unfortunately, could not accommodate TEE/DCCV until tomorrow. BP soft today.  Objective   Vitals:   07/26/19 2154 07/27/19 0000 07/27/19 0355 07/27/19 0459  BP: (!) 122/98 (!) 89/68 109/78   Pulse: (!) 114 (!) 112 (!) 112   Resp:  20 20   Temp:  97.9 F (36.6 C) 97.7 F (36.5 C)   TempSrc:  Oral Oral   SpO2:  97% 97%   Weight:    76.2 kg  Height:        Intake/Output Summary (Last 24 hours) at 07/27/2019 0748 Last data filed at 07/27/2019 0400 Gross per 24 hour  Intake 360 ml  Output 400 ml  Net -40 ml   Filed Weights   07/25/19 0722 07/26/19 0429 07/27/19 0459  Weight: 79.9 kg 75.9 kg 76.2 kg    Physical Exam   General appearance: alert and no distress Neck: no carotid bruit, no JVD and thyroid not enlarged, symmetric, no tenderness/mass/nodules Lungs: clear to auscultation bilaterally Heart: regular rate and rhythm and tachycardic Abdomen: soft, non-tender; bowel sounds normal; no masses,  no organomegaly Extremities: extremities normal, atraumatic, no cyanosis or edema Pulses: 2+ and symmetric Skin: Skin color, texture, turgor normal. No rashes or lesions Neurologic: Grossly normal Psych: Pleasant  Inpatient Medications    Scheduled Meds: . aspirin EC  81 mg Oral Daily  . furosemide  40 mg Intravenous Daily  . metoprolol tartrate  25 mg Oral BID  . rivaroxaban  20 mg Oral Daily  . traZODone  50 mg Oral QHS    Continuous Infusions: . [START ON 07/28/2019] sodium chloride      PRN Meds: acetaminophen, nitroGLYCERIN, ondansetron (ZOFRAN) IV   Labs   Results for orders placed or performed during the hospital  encounter of 07/25/19 (from the past 48 hour(s))  Troponin I (High Sensitivity)     Status: Abnormal   Collection Time: 07/25/19 12:41 PM  Result Value Ref Range   Troponin I (High Sensitivity) 41 (H) <18 ng/L    Comment: (NOTE) Elevated high sensitivity troponin I (hsTnI) values and significant  changes across serial measurements may suggest ACS but many other  chronic and acute conditions are known to elevate hsTnI results.  Refer to the "Links" section for chest pain algorithms and additional  guidance. Performed at [facility], [place] [address], [place], [place] [id number]   Basic metabolic panel     Status: Abnormal   Collection Time: 07/26/19  2:47 AM  Result Value Ref Range   Sodium 139 135 - 145 mmol/L   Potassium 4.3 3.5 - 5.1 mmol/L   Chloride 107 98 - 111 mmol/L   CO2 24 22 - 32 mmol/L   Glucose, Bld 108 (H) 70 - 99 mg/dL    Comment: Glucose reference range applies only to samples taken after fasting for at least 8 hours.   BUN 16 8 - 23 mg/dL   Creatinine, Ser 1.09 0.61 - 1.24 mg/dL   Calcium 9.2 8.9 - 10.3 mg/dL   GFR calc non Af Amer >60 >60 mL/min   GFR calc Af Amer >60 >60 mL/min   Anion gap 8  5 - 15    Comment: Performed at [facility], [place] [address], [place], [place] [address]  Protime-INR     Status: Abnormal   Collection Time: 07/26/19 10:46 PM  Result Value Ref Range   Prothrombin Time 25.2 (H) 11.4 - 15.2 seconds   INR 2.4 (H) 0.8 - 1.2    Comment: (NOTE) INR goal varies based on device and disease states. Performed at [facility], [place] [address], [place], [place] [id number]   Basic metabolic panel     Status: Abnormal   Collection Time: 07/27/19  1:40 AM  Result Value Ref Range   Sodium 135 135 - 145 mmol/L   Potassium 4.6 3.5 - 5.1 mmol/L   Chloride 105 98 - 111 mmol/L   CO2 21 (L) 22 - 32 mmol/L   Glucose, Bld 111 (H) 70 - 99 mg/dL    Comment: Glucose reference range applies only to samples taken after fasting for at least 8  hours.   BUN 21 8 - 23 mg/dL   Creatinine, Ser 1.34 (H) 0.61 - 1.24 mg/dL   Calcium 9.2 8.9 - 10.3 mg/dL   GFR calc non Af Amer 53 (L) >60 mL/min   GFR calc Af Amer >60 >60 mL/min   Anion gap 9 5 - 15    Comment: Performed at [facility] [address] [place],  [address]    ECG   Atrial flutter with 2:1 AV Block - Personally Reviewed  Telemetry   Atrial flutter with variable ventricular response - Personally Reviewed  Radiology    ECHOCARDIOGRAM COMPLETE  Result Date: 07/25/2019    ECHOCARDIOGRAM REPORT   Patient Name:   Alan [name] Date of Exam: 07/25/2019 Medical Rec #:  [id number]              Height:       72.0 in Accession #:    [id number]             Weight:       176.1 lb Date of Birth:  September 02, 1950              BSA:          2.019 m Patient Age:    36 years               BP:           136/97 mmHg Patient Gender: M                      HR:           114 bpm. Exam Location:  Inpatient Procedure: 2D Echo, Cardiac Doppler and Color Doppler Indications:    Chest pain  History:        Patient has prior history of Echocardiogram examinations, most                 recent 10/12/2018. CAD, Prior CABG, Aortic Valve Disease, Mitral                 Valve Disease and AVR, MV repai, Arrythmias:Atrial Fibrillation                 and Atrial Flutter; Risk Factors:Dyslipidemia. Pleural effsuion.                 Aortic Valve: 23 mm Edwards MagnaEase valve is present in the  aortic position.                 Mitral Valve: valve is present in the mitral position.  Sonographer:    Dustin Flock Referring Phys: [id number] [place]  1. Left ventricular ejection fraction, by estimation, is 60 to 65%. The left ventricle has normal function. The left ventricle has no regional wall motion abnormalities. There is mild left ventricular hypertrophy. Left ventricular diastolic function could not be evaluated. Elevated left ventricular end-diastolic pressure.   2. Right ventricular systolic function is normal. The right ventricular size is normal. There is moderately elevated pulmonary artery systolic pressure. The estimated right ventricular systolic pressure is Q000111Q mmHg.  3. Left atrial size was moderately dilated.  4. Right atrial size was mild to moderately dilated.  5. The mitral valve has been repaired/replaced. Mild to moderate mitral valve regurgitation.  6. The tricuspid valve is abnormal. Tricuspid valve regurgitation is moderate.  7. The aortic valve is tricuspid. Aortic valve regurgitation is not visualized. There is a 23 mm Edwards MagnaEase valve present in the aortic position.  8. The inferior vena cava is dilated in size with <50% respiratory variability, suggesting right atrial pressure of 15 mmHg. FINDINGS  Left Ventricle: Left ventricular ejection fraction, by estimation, is 60 to 65%. The left ventricle has normal function. The left ventricle has no regional wall motion abnormalities. The left ventricular internal cavity size was normal in size. There is  mild left ventricular hypertrophy. Left ventricular diastolic function could not be evaluated due to atrial fibrillation. Left ventricular diastolic function could not be evaluated. Elevated left ventricular end-diastolic pressure. Right Ventricle: The right ventricular size is normal. No increase in right ventricular wall thickness. Right ventricular systolic function is normal. There is moderately elevated pulmonary artery systolic pressure. The tricuspid regurgitant velocity is 3.27 m/s, and with an assumed right atrial pressure of 15 mmHg, the estimated right ventricular systolic pressure is Q000111Q mmHg. Left Atrium: Left atrial size was moderately dilated. Right Atrium: Right atrial size was mild to moderately dilated. Pericardium: There is no evidence of pericardial effusion. Mitral Valve: The mitral valve has been repaired/replaced. There is mild thickening of the mitral valve leaflet(s). Mild to  moderate mitral valve regurgitation. There is a present in the mitral position. MV peak gradient, 18.3 mmHg. The mean mitral valve  gradient is 7.0 mmHg. Tricuspid Valve: The tricuspid valve is abnormal. Tricuspid valve regurgitation is moderate. Aortic Valve: The aortic valve is tricuspid. Aortic valve regurgitation is not visualized. There is a 23 mm Edwards MagnaEase valve present in the aortic position. Pulmonic Valve: The pulmonic valve was grossly normal. Pulmonic valve regurgitation is trivial. Aorta: The aortic root and ascending aorta are structurally normal, with no evidence of dilitation. Venous: The inferior vena cava is dilated in size with less than 50% respiratory variability, suggesting right atrial pressure of 15 mmHg. IAS/Shunts: No atrial level shunt detected by color flow Doppler. Additional Comments: There is a small pleural effusion in the left lateral region.  LEFT VENTRICLE PLAX 2D LVIDd:         4.61 cm  Diastology LVIDs:         2.73 cm  LV e' lateral:   11.90 cm/s LV PW:         1.19 cm  LV E/e' lateral: 14.7 LV IVS:        1.03 cm  LV e' medial:    7.83 cm/s LVOT diam:     2.00  cm  LV E/e' medial:  22.3 LV SV:         33 LV SV Index:   16 LVOT Area:     3.14 cm  RIGHT VENTRICLE RV Basal diam:  3.99 cm RV S prime:     5.66 cm/s TAPSE (M-mode): 2.2 cm LEFT ATRIUM             Index       RIGHT ATRIUM           Index LA diam:        4.80 cm 2.38 cm/m  RA Area:     25.80 cm LA Vol (A2C):   90.8 ml 44.98 ml/m RA Volume:   75.70 ml  37.50 ml/m LA Vol (A4C):   83.6 ml 41.41 ml/m LA Biplane Vol: 92.2 ml 45.67 ml/m  AORTIC VALVE LVOT Vmax:   82.60 cm/s LVOT Vmean:  49.100 cm/s LVOT VTI:    0.104 m  AORTA Ao Root diam: 2.30 cm MITRAL VALVE                TRICUSPID VALVE MV Area (PHT): 4.71 cm     TR Peak grad:   42.8 mmHg MV Peak grad:  18.3 mmHg    TR Vmax:        327.00 cm/s MV Mean grad:  7.0 mmHg MV Vmax:       2.14 m/s     SHUNTS MV Vmean:      117.0 cm/s   Systemic VTI:  0.10 m MV  Decel Time: 161 msec     Systemic Diam: 2.00 cm MV E velocity: 175.00 cm/s Lyman Bishop MD Electronically signed by Lyman Bishop MD Signature Date/Time: 07/25/2019/2:50:10 PM    Final     Cardiac Studies   N/A  Assessment   Principal Problem:   Atrial flutter with rapid ventricular response (HCC) Active Problems:   Chest pain   Plan   1. HR stable -not much urine output with lasix, weight stable if not mildly up. Creatinine bumped -stop lasix today. He was on QOD lasix at home. Mildly hypotensive today - may be dry, will hold am metoprolol, decrease to 12.5 mg BID tonight. Plan for TEE/DCCV tomorrow.  Time Spent Directly with Patient:  I have spent a total of 25 minutes with the patient r eviewing hospital notes, telemetry, EKGs, labs and examining the patient as well as establishing an assessment and plan that was discussed personally with the patient.  > 50% of time was spent in direct patient care.  Length of Stay:  LOS: 2 days   Pixie Casino, MD, [facility], [place] Director of the [facility]  Card[facility] Diplomate of the American Board of Clinical Lipidology Attending Cardiologist  Direct Dial: [phone]  Fax: [phone]  Website:  [email or web address] [email or web address] Maleyah Evans 07/27/2019, 7:48 AM

## 2019-07-28 ENCOUNTER — Encounter (HOSPITAL_COMMUNITY): Payer: Self-pay | Admitting: Cardiovascular Disease

## 2019-07-28 ENCOUNTER — Inpatient Hospital Stay (HOSPITAL_COMMUNITY): Payer: Medicare Other

## 2019-07-28 ENCOUNTER — Inpatient Hospital Stay (HOSPITAL_COMMUNITY): Payer: Medicare Other | Admitting: Certified Registered Nurse Anesthetist

## 2019-07-28 ENCOUNTER — Encounter (HOSPITAL_COMMUNITY)
Admission: EM | Disposition: A | Discharge status: Home / Self Care | Payer: Self-pay | Source: Home / Self Care | Attending: Cardiology

## 2019-07-28 DIAGNOSIS — I351 Nonrheumatic aortic (valve) insufficiency: Secondary | ICD-10-CM

## 2019-07-28 DIAGNOSIS — Z9889 Other specified postprocedural states: Secondary | ICD-10-CM

## 2019-07-28 DIAGNOSIS — Z953 Presence of xenogenic heart valve: Secondary | ICD-10-CM

## 2019-07-28 DIAGNOSIS — Z951 Presence of aortocoronary bypass graft: Secondary | ICD-10-CM

## 2019-07-28 DIAGNOSIS — I340 Nonrheumatic mitral (valve) insufficiency: Secondary | ICD-10-CM

## 2019-07-28 HISTORY — PX: TEE WITHOUT CARDIOVERSION: SHX5443

## 2019-07-28 HISTORY — PX: CARDIOVERSION: SHX1299

## 2019-07-28 LAB — BASIC METABOLIC PANEL
Anion gap: 6 (ref 5–15)
BUN: 22 mg/dL (ref 8–23)
CO2: 26 mmol/L (ref 22–32)
Calcium: 8.8 mg/dL — ABNORMAL LOW (ref 8.9–10.3)
Chloride: 105 mmol/L (ref 98–111)
Creatinine, Ser: 1.26 mg/dL — ABNORMAL HIGH (ref 0.61–1.24)
GFR calc Af Amer: >60 mL/min (ref >60)
GFR calc non Af Amer: 57 mL/min — ABNORMAL LOW (ref >60)
Glucose, Bld: 98 mg/dL (ref 70–99)
Potassium: 4.3 mmol/L (ref 3.5–5.1)
Sodium: 137 mmol/L (ref 135–145)

## 2019-07-28 LAB — PROTIME-INR
INR: 1.7 — ABNORMAL HIGH (ref 0.8–1.2)
Prothrombin Time: 19.6 seconds — ABNORMAL HIGH (ref 11.4–15.2)

## 2019-07-28 SURGERY — ECHOCARDIOGRAM, TRANSESOPHAGEAL
Anesthesia: Monitor Anesthesia Care

## 2019-07-28 MED ORDER — SODIUM CHLORIDE 0.9 % IV SOLN: INTRAVENOUS | Status: DC | PRN

## 2019-07-28 MED ORDER — FUROSEMIDE 40 MG PO TABS: 40.0000 mg | ORAL_TABLET | Freq: Every day | ORAL | Status: DC

## 2019-07-28 MED ORDER — ASPIRIN 81 MG PO TBEC: 81.0000 mg | DELAYED_RELEASE_TABLET | Freq: Every day | ORAL | Status: DC

## 2019-07-28 MED ORDER — ACETAMINOPHEN 325 MG PO TABS: 650.0000 mg | ORAL_TABLET | ORAL | Status: AC | PRN

## 2019-07-28 MED ORDER — PHENYLEPHRINE HCL-NACL 10-0.9 MG/250ML-% IV SOLN
INTRAVENOUS | Status: DC | PRN
  Administered 2019-07-28: 50 ug/min via INTRAVENOUS
  Administered 2019-07-28: 60 ug/min via INTRAVENOUS

## 2019-07-28 MED ORDER — PHENYLEPHRINE 40 MCG/ML (10ML) SYRINGE FOR IV PUSH (FOR BLOOD PRESSURE SUPPORT)
PREFILLED_SYRINGE | INTRAVENOUS | Status: DC | PRN
  Administered 2019-07-28 (×2): 120 ug via INTRAVENOUS
  Administered 2019-07-28 (×2): 80 ug via INTRAVENOUS
  Administered 2019-07-28 (×2): 120 ug via INTRAVENOUS

## 2019-07-28 MED ORDER — PROPOFOL 10 MG/ML IV BOLUS
INTRAVENOUS | Status: DC | PRN
  Administered 2019-07-28 (×3): 10 mg via INTRAVENOUS

## 2019-07-28 MED ORDER — METOPROLOL TARTRATE 25 MG PO TABS: 12.5000 mg | ORAL_TABLET | Freq: Two times a day (BID) | ORAL | 6 refills | Status: DC

## 2019-07-28 MED ORDER — PROPOFOL 500 MG/50ML IV EMUL
INTRAVENOUS | Status: DC | PRN
  Administered 2019-07-28: 100 ug/kg/min via INTRAVENOUS

## 2019-07-28 MED ORDER — NITROGLYCERIN 0.4 MG SL SUBL: 0.4000 mg | SUBLINGUAL_TABLET | SUBLINGUAL | 4 refills | Status: DC | PRN

## 2019-07-28 MED ORDER — RIVAROXABAN 20 MG PO TABS: 20.0000 mg | ORAL_TABLET | Freq: Every day | ORAL | 6 refills | Status: DC

## 2019-07-28 MED ORDER — EPHEDRINE SULFATE-NACL 50-0.9 MG/10ML-% IV SOSY
PREFILLED_SYRINGE | INTRAVENOUS | Status: DC | PRN
  Administered 2019-07-28 (×2): 10 mg via INTRAVENOUS

## 2019-07-28 MED ORDER — FUROSEMIDE 20 MG PO TABS
20.0000 mg | ORAL_TABLET | Freq: Every day | ORAL | 11 refills | Status: DC
Start: 2019-07-28 — End: 2019-08-01

## 2019-07-28 MED ORDER — FUROSEMIDE 40 MG PO TABS: 40.0000 mg | ORAL_TABLET | Freq: Every day | ORAL | 6 refills | Status: DC

## 2019-07-28 NOTE — Anesthesia Procedure Notes (Signed)
Procedure Name: MAC Date/Time: 07/28/2019 7:45 AM Performed by: Harden Mo, CRNA Pre-anesthesia Checklist: Patient identified, Emergency Drugs available, Suction available and Patient being monitored Patient Re-evaluated:Patient Re-evaluated prior to induction Oxygen Delivery Method: Nasal cannula Preoxygenation: Pre-oxygenation with 100% oxygen Induction Type: IV induction Placement Confirmation: positive ETCO2 and breath sounds checked- equal and bilateral Dental Injury: Teeth and Oropharynx as per pre-operative assessment

## 2019-07-28 NOTE — Anesthesia Postprocedure Evaluation (Signed)
Anesthesia Post Note  Patient: [place] Meneely Revere  Procedure(s) Performed: TRANSESOPHAGEAL ECHOCARDIOGRAM (TEE) (N/A ) CARDIOVERSION (N/A )     Patient location during evaluation: PACU Anesthesia Type: General Level of consciousness: awake and alert and oriented Pain management: pain level controlled Vital Signs Assessment: post-procedure vital signs reviewed and stable Respiratory status: spontaneous breathing, nonlabored ventilation and respiratory function stable Cardiovascular status: unstable and blood pressure returned to baseline Postop Assessment: no apparent nausea or vomiting Anesthetic complications: no Comments: Patient required phenylephrine gtt post cardioversion for BP . Taken to PACU post procedure and Phenylephrine weaned.    Last Vitals:  Vitals:   07/28/19 0930 07/28/19 0945  BP: 98/84 94/75  Pulse: 79 76  Resp: 19 16  Temp:    SpO2: 96% 99%    Last Pain:  Vitals:   07/28/19 0930  TempSrc:   PainSc: 3                  Shamecca Whitebread A.

## 2019-07-28 NOTE — Plan of Care (Signed)

## 2019-07-28 NOTE — Progress Notes (Signed)
    Transesophageal Echocardiogram Note  Alan [name] [id number] 20-May-1950  Procedure: Transesophageal Echocardiogram Indications: Atrial flutter  Procedure Details Consent: Obtained Time Out: Verified patient identification, verified procedure, site/side was marked, verified correct patient position, special equipment/implants available, Radiology Safety Procedures followed,  medications/allergies/relevent history reviewed, required imaging and test results available.  Performed  Medications:  Pt sedated by anesthesia with diprovan 155 mg IV total. Normal LV function; severe biatrial enlargement; LAA clipped with no LA thrombus; mild RVE with mild RV dysfunction; s/p AVR with normal gradients and no AI; MV leaflets do not coapt; severe MR; moderate to severe TR.Marland Kitchen   Pt subsequently underwent DCCV with 120J to sinus rhythm; no immediate complications. Continue xarelto.   Complications: No apparent complications Patient did tolerate procedure well.  [name], MD

## 2019-07-28 NOTE — Discharge Summary (Signed)
Discharge Summary    Patient ID: HOAN ROUGH MRN: [id number]; DOB: 02-Apr-1950  Admit date: 07/25/2019 Discharge date: 07/28/2019  Primary Care Provider: Garth Bigness (Inactive)  Primary Cardiologist: Sanda Klein, MD  Primary Electrophysiologist:  None   Discharge Diagnoses    Principal Problem:   Atrial flutter with rapid ventricular response ([place]) s/p TEE/DCCV 07/28/19 successful Active Problems:   Mitral regurgitation   S/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   S/P CABG x 1   S/P mitral valve repair   Chest pain    Diagnostic Studies/Procedures    Procedure: Transesophageal Echocardiogram Indications: Atrial flutter  Procedure Details Consent: Obtained Time Out: Verified patient identification, verified procedure, site/side was marked, verified correct patient position, special equipment/implants available, Radiology Safety Procedures followed,  medications/allergies/relevent history reviewed, required imaging and test results available.  Performed  Medications:  Pt sedated by anesthesia with diprovan 155 mg IV total. Normal LV function; severe biatrial enlargement; LAA clipped with no LA thrombus; mild RVE with mild RV dysfunction; s/p AVR with normal gradients and no AI; MV leaflets do not coapt; severe MR; moderate to severe TR.Marland Kitchen   Pt subsequently underwent DCCV with 120J to sinus rhythm; no immediate complications. Continue xarelto.   ECHO 07/25/19 IMPRESSIONS    1. Left ventricular ejection fraction, by estimation, is 60 to 65%. The  left ventricle has normal function. The left ventricle has no regional  wall motion abnormalities. There is mild left ventricular hypertrophy.  Left ventricular diastolic function  could not be evaluated. Elevated left ventricular end-diastolic pressure.  2. Right ventricular systolic function is normal. The right ventricular  size is normal. There is moderately elevated pulmonary artery  systolic  pressure. The estimated right ventricular systolic pressure is Q000111Q mmHg.  3. Left atrial size was moderately dilated.  4. Right atrial size was mild to moderately dilated.  5. The mitral valve has been repaired/replaced. Mild to moderate mitral  valve regurgitation.  6. The tricuspid valve is abnormal. Tricuspid valve regurgitation is  moderate.  7. The aortic valve is tricuspid. Aortic valve regurgitation is not  visualized. There is a 23 mm Edwards MagnaEase valve present in the aortic  position.  8. The inferior vena cava is dilated in size with <50% respiratory  variability, suggesting right atrial pressure of 15 mmHg.   FINDINGS  Left Ventricle: Left ventricular ejection fraction, by estimation, is 60  to 65%. The left ventricle has normal function. The left ventricle has no  regional wall motion abnormalities. The left ventricular internal cavity  size was normal in size. There is  mild left ventricular hypertrophy. Left ventricular diastolic function  could not be evaluated due to atrial fibrillation. Left ventricular  diastolic function could not be evaluated. Elevated left ventricular  end-diastolic pressure.   Right Ventricle: The right ventricular size is normal. No increase in  right ventricular wall thickness. Right ventricular systolic function is  normal. There is moderately elevated pulmonary artery systolic pressure.  The tricuspid regurgitant velocity is  3.27 m/s, and with an assumed right atrial pressure of 15 mmHg, the  estimated right ventricular systolic pressure is Q000111Q mmHg.   Left Atrium: Left atrial size was moderately dilated.   Right Atrium: Right atrial size was mild to moderately dilated.   Pericardium: There is no evidence of pericardial effusion.   Mitral Valve: The mitral valve has been repaired/replaced. There is mild  thickening of the mitral valve leaflet(s). Mild to moderate mitral valve  regurgitation. There  is a present  in the mitral position. MV peak  gradient, 18.3 mmHg. The mean mitral valve  gradient is 7.0 mmHg.   Tricuspid Valve: The tricuspid valve is abnormal. Tricuspid valve  regurgitation is moderate.   Aortic Valve: The aortic valve is tricuspid. Aortic valve regurgitation is  not visualized. There is a 23 mm Edwards MagnaEase valve present in the  aortic position.   Pulmonic Valve: The pulmonic valve was grossly normal. Pulmonic valve  regurgitation is trivial.   Aorta: The aortic root and ascending aorta are structurally normal, with  no evidence of dilitation.   Venous: The inferior vena cava is dilated in size with less than 50%  respiratory variability, suggesting right atrial pressure of 15 mmHg.   IAS/Shunts: No atrial level shunt detected by color flow Doppler.   Additional Comments: There is a small pleural effusion in the left lateral  region.  _____________   History of Present Illness     Alan [name] is a 69 y.o. 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 Cardiac cath: Stents to LPDA and OM1, LIMA noted to be patent. -Aug 2020: myocardial perfusion scan - no defects, Echo LVEF 60-65%, LAE, aortic root 45mm  Pt presented to ER 07/25/19 with chest tightness and found to have atrial flutter,  HLD, anemia, vertigo, migraines.  His chest discomfort had ben intermittent over last 5 days, 2-3 pillow orthopnea, no edema.  He occ forgets to take  ASA.  In ER CTA of chest was neg for PE, small to moderate Rt and sm. Lt pl effusion  BNP 458,   EKG with a flutter rate 116, T wave abnormalities in V6 III and aVF mild ST depression in V5-6 sl. Elevation in AVR  troponin hs 36  COVID neg.    Admitted to eval for MI and diuresis with IV lasix and further treatment of flutter.    Hospital Course     Consultants: none   Pt was diuresed until Cr elevated - at home on lasix every other day -- per I&O he is +308 and wt on other hand is down from 79.9 to 76.9 Kg.   Cr improved today from 1.34 to 1.26.  GFR 57.   We will discharge with lasix 40 mg daily beginning tomorrow  He has been placed on xarelto   Hs troponin 36, 36, 42 flat. A flutter cause of chest pain.    Pt underwent TEE/DCCV successfully to SR and EKG Sinus with rt ward axis and non specific ST abnormalities.  He had some lower BP initially but resolved post TEE.   TEE noted severe MR.  Will defer to Dr. Barrie Dunker, it may be the a flutter is causing increase in MR though on TTE noted as moderate in flutter.     Pt is stable and continues in SR with plan for discharge.  He has been seen and evaluated by Dr. Debara Pickett and stable for discharge.     Did the patient have an acute coronary syndrome (MI, NSTEMI,  STEMI, etc) this admission?:  No                               Did the patient have a percutaneous coronary intervention (stent / angioplasty)?:  No.   _____________  Discharge Vitals Blood pressure 105/78, pulse 80, temperature 97.6 F (36.4 C), resp. rate 13, height 6' (1.829 m), weight 76.9 kg, SpO2 96 %.  Filed Weights   07/27/19 0459 07/28/19 0005 07/28/19 0500  Weight: 76.2 kg 76.9 kg 76.9 kg   PE per Dr. Debara Pickett   Labs & Radiologic Studies    CBC No results for input(s): WBC, NEUTROABS, HGB, HCT, MCV, PLT in the last 72 hours. Basic Metabolic Panel Recent Labs    07/27/19 0140 07/28/19 0059  NA 135 137  K 4.6 4.3  CL 105 105  CO2 21* 26  GLUCOSE 111* 98  BUN 21  22  CREATININE 1.34* 1.26*  CALCIUM 9.2 8.8*   Liver Function Tests No results for input(s): AST, ALT, ALKPHOS, BILITOT, PROT, ALBUMIN in the last 72 hours. No results for input(s): LIPASE, AMYLASE in the last 72 hours. High Sensitivity Troponin:   Recent Labs  Lab 07/25/19 0202 07/25/19 0429 07/25/19 0742 07/25/19 1241  TROPONINIHS 36* 35* 42* 41*    BNP Invalid input(s): POCBNP D-Dimer No results for input(s): DDIMER in the last 72 hours. Hemoglobin A1C No results for input(s): HGBA1C in the last 72 hours. Fasting Lipid Panel No results for input(s): CHOL, HDL, LDLCALC, TRIG, CHOLHDL, LDLDIRECT in the last 72 hours. Thyroid Function Tests No results for input(s): TSH, T4TOTAL, T3FREE, THYROIDAB in the last 72 hours.  Invalid input(s): FREET3 _____________  CT Angio Chest PE W and/or Wo Contrast  Result Date: 07/25/2019 CLINICAL DATA:  Shortness of breath history of aortic valve replacement EXAM: CT ANGIOGRAPHY CHEST WITH CONTRAST TECHNIQUE: Multidetector CT imaging of the chest was performed using the standard protocol during bolus administration of intravenous contrast. Multiplanar CT image reconstructions and MIPs were obtained to evaluate the vascular anatomy. CONTRAST:  142mL OMNIPAQUE IOHEXOL 350 MG/ML SOLN COMPARISON:  Radiograph same day FINDINGS: Cardiovascular: There is a optimal opacification of the pulmonary arteries. There is no central,segmental, or subsegmental filling defects within the pulmonary arteries. There is mild cardiomegaly. Prosthetic aortic valve is seen. No pericardial effusion or thickening. No evidence right heart strain. There is normal three-vessel brachiocephalic anatomy without proximal stenosis. Scattered mild aortic atherosclerosis is noted. Mediastinum/Nodes: No hilar, mediastinal, or axillary adenopathy. Thyroid gland, trachea, and esophagus demonstrate no significant findings. Lungs/Pleura: Small bilateral pleural effusions are present, right  greater than left. Mild hazy airspace opacity seen throughout both lungs with mildly increased interlobular septal thickening at both lung bases. Upper Abdomen: No acute abnormalities present in the visualized portions of the upper abdomen. Musculoskeletal: No chest wall abnormality. No acute or significant osseous findings. Overlying median sternotomy wires are present. Review of the MIP images confirms the above findings. IMPRESSION: Small to moderate right and small left pleural effusion with findings suggestive of diffuse mild pulmonary edema. No central, segmental, or subsegmental pulmonary embolism. Electronically Signed   By: Prudencio Pair M.D.   On: 07/25/2019 03:50   DG Chest Portable 1 View  Result Date: 07/25/2019 CLINICAL DATA:  Chest pain EXAM: PORTABLE CHEST 1 VIEW COMPARISON:  December 11, 2014 FINDINGS: There is mild cardiomegaly. Overlying median sternotomy wires and prosthetic aortic valve is seen. There is prominence of the central  pulmonary vasculature with mildly increased interstitial markings seen throughout. There is a probable trace left pleural effusion present. No acute osseous abnormality. IMPRESSION: Findings suggestive of diffuse interstitial edema and trace left pleural effusion Electronically Signed   By: Prudencio Pair M.D.   On: 07/25/2019 02:15   ECHOCARDIOGRAM COMPLETE  Result Date: 07/25/2019    ECHOCARDIOGRAM REPORT   Patient Name:   DAVID BALDI Stricker Date of Exam: 07/25/2019 Medical Rec #:  [id number]              Height:       72.0 in Accession #:    [id number]             Weight:       176.1 lb Date of Birth:  03/18/1950              BSA:          2.019 m Patient Age:    69 years               BP:           136/97 mmHg Patient Gender: M                      HR:           114 bpm. Exam Location:  Inpatient Procedure: 2D Echo, Cardiac Doppler and Color Doppler Indications:    Chest pain  History:        Patient has prior history of Echocardiogram examinations, most                  recent 10/12/2018. CAD, Prior CABG, Aortic Valve Disease, Mitral                 Valve Disease and AVR, MV repai, Arrythmias:Atrial Fibrillation                 and Atrial Flutter; Risk Factors:Dyslipidemia. Pleural effsuion.                 Aortic Valve: 23 mm Edwards MagnaEase valve is present in the                 aortic position.                 Mitral Valve: valve is present in the mitral position.  Sonographer:    Dustin Flock Referring Phys: [id number] [place]  1. Left ventricular ejection fraction, by estimation, is 60 to 65%. The left ventricle has normal function. The left ventricle has no regional wall motion abnormalities. There is mild left ventricular hypertrophy. Left ventricular diastolic function could not be evaluated. Elevated left ventricular end-diastolic pressure.  2. Right ventricular systolic function is normal. The right ventricular size is normal. There is moderately elevated pulmonary artery systolic pressure. The estimated right ventricular systolic pressure is Q000111Q mmHg.  3. Left atrial size was moderately dilated.  4. Right atrial size was mild to moderately dilated.  5. The mitral valve has been repaired/replaced. Mild to moderate mitral valve regurgitation.  6. The tricuspid valve is abnormal. Tricuspid valve regurgitation is moderate.  7. The aortic valve is tricuspid. Aortic valve regurgitation is not visualized. There is a 23 mm Edwards MagnaEase valve present in the aortic position.  8. The inferior vena cava is dilated in size with <50% respiratory variability, suggesting right atrial pressure of 15 mmHg. FINDINGS  Left Ventricle: Left ventricular ejection fraction, by estimation, is  60 to 65%. The left ventricle has normal function. The left ventricle has no regional wall motion abnormalities. The left ventricular internal cavity size was normal in size. There is  mild left ventricular hypertrophy. Left ventricular diastolic function could not  be evaluated due to atrial fibrillation. Left ventricular diastolic function could not be evaluated. Elevated left ventricular end-diastolic pressure. Right Ventricle: The right ventricular size is normal. No increase in right ventricular wall thickness. Right ventricular systolic function is normal. There is moderately elevated pulmonary artery systolic pressure. The tricuspid regurgitant velocity is 3.27 m/s, and with an assumed right atrial pressure of 15 mmHg, the estimated right ventricular systolic pressure is Q000111Q mmHg. Left Atrium: Left atrial size was moderately dilated. Right Atrium: Right atrial size was mild to moderately dilated. Pericardium: There is no evidence of pericardial effusion. Mitral Valve: The mitral valve has been repaired/replaced. There is mild thickening of the mitral valve leaflet(s). Mild to moderate mitral valve regurgitation. There is a present in the mitral position. MV peak gradient, 18.3 mmHg. The mean mitral valve  gradient is 7.0 mmHg. Tricuspid Valve: The tricuspid valve is abnormal. Tricuspid valve regurgitation is moderate. Aortic Valve: The aortic valve is tricuspid. Aortic valve regurgitation is not visualized. There is a 23 mm Edwards MagnaEase valve present in the aortic position. Pulmonic Valve: The pulmonic valve was grossly normal. Pulmonic valve regurgitation is trivial. Aorta: The aortic root and ascending aorta are structurally normal, with no evidence of dilitation. Venous: The inferior vena cava is dilated in size with less than 50% respiratory variability, suggesting right atrial pressure of 15 mmHg. IAS/Shunts: No atrial level shunt detected by color flow Doppler. Additional Comments: There is a small pleural effusion in the left lateral region.  LEFT VENTRICLE PLAX 2D LVIDd:         4.61 cm  Diastology LVIDs:         2.73 cm  LV e' lateral:   11.90 cm/s LV PW:         1.19 cm  LV E/e' lateral: 14.7 LV IVS:        1.03 cm  LV e' medial:    7.83 cm/s LVOT diam:      2.00 cm  LV E/e' medial:  22.3 LV SV:         33 LV SV Index:   16 LVOT Area:     3.14 cm  RIGHT VENTRICLE RV Basal diam:  3.99 cm RV S prime:     5.66 cm/s TAPSE (M-mode): 2.2 cm LEFT ATRIUM             Index       RIGHT ATRIUM           Index LA diam:        4.80 cm 2.38 cm/m  RA Area:     25.80 cm LA Vol (A2C):   90.8 ml 44.98 ml/m RA Volume:   75.70 ml  37.50 ml/m LA Vol (A4C):   83.6 ml 41.41 ml/m LA Biplane Vol: 92.2 ml 45.67 ml/m  AORTIC VALVE LVOT Vmax:   82.60 cm/s LVOT Vmean:  49.100 cm/s LVOT VTI:    0.104 m  AORTA Ao Root diam: 2.30 cm MITRAL VALVE                TRICUSPID VALVE MV Area (PHT): 4.71 cm     TR Peak grad:   42.8 mmHg MV Peak grad:  18.3 mmHg    TR Vmax:  327.00 cm/s MV Mean grad:  7.0 mmHg MV Vmax:       2.14 m/s     SHUNTS MV Vmean:      117.0 cm/s   Systemic VTI:  0.10 m MV Decel Time: 161 msec     Systemic Diam: 2.00 cm MV E velocity: 175.00 cm/s Lyman Bishop MD Electronically signed by Lyman Bishop MD Signature Date/Time: 07/25/2019/2:50:10 PM    Final    Disposition   Pt is being discharged home today in good condition.  Follow-up Plans & Appointments   Low salt diet  Heart healthy   Weigh daily and if weight increases 3 pounds in a day or 5 pounds in a week then call the office, we would increase lasix at that time. For now beginning tomorrow 20 mg daily  We stopped your asprin since you are now on xarelto     Follow-up Information    Croitoru, Mihai, MD Follow up on 08/10/2019.   Specialty: Cardiology Why: at 1:40 pm  Contact information: [address] [place] [address] [phone]            Discharge Medications   Allergies as of 07/28/2019      Reactions   Pollen Extract Other (See Comments)   sneezing      Medication List    TAKE these medications   acetaminophen 325 MG tablet Commonly known as: TYLENOL Take 2 tablets (650 mg total) by mouth every 4 (four) hours as needed for headache or mild pain.    atorvastatin 40 MG tablet Commonly known as: Lipitor Take 1 tablet (40 mg total) by mouth daily.   CO-Q 10 Omega-3 Fish Oil Caps Take 1 capsule by mouth daily.   furosemide 20 MG tablet Commonly known as: Lasix Take 1 tablet (20 mg total) by mouth daily. What changed: when to take this   gabapentin 300 MG capsule Commonly known as: NEURONTIN Take 300 mg by mouth 2 (two) times daily. at dinner and at bedtime   metoprolol tartrate 25 MG tablet Commonly known as: LOPRESSOR Take 0.5 tablets (12.5 mg total) by mouth 2 (two) times daily.   nitroGLYCERIN 0.4 MG SL tablet Commonly known as: NITROSTAT Place 1 tablet (0.4 mg total) under the tongue every 5 (five) minutes x 3 doses as needed for chest pain.   potassium chloride SA 20 MEQ tablet Commonly known as: KLOR-CON Take 1 tablet (20 mEq total) by mouth daily.   PreserVision AREDS 2 Caps Take 1 capsule by mouth every evening.   PROBIOTIC PO Take 1 capsule by mouth daily.   rivaroxaban 20 MG Tabs tablet Commonly known as: XARELTO Take 1 tablet (20 mg total) by mouth daily.   Turmeric 500 MG Caps Take 500 mg by mouth every evening.          Outstanding Labs/Studies   BMP and CBC with new xarelto  Duration of Discharge Encounter   Greater than 30 minutes including physician time.  Signed, Cecilie Kicks, NP 07/28/2019, 10:48 AM

## 2019-07-28 NOTE — Plan of Care (Signed)

## 2019-07-28 NOTE — Interval H&P Note (Signed)
History and Physical Interval Note:  07/28/2019 7:11 AM  Alan [name]  has presented today for surgery, with the diagnosis of AFIB.  The various methods of treatment have been discussed with the patient and family. After consideration of risks, benefits and other options for treatment, the patient has consented to  Procedure(s): TRANSESOPHAGEAL ECHOCARDIOGRAM (TEE) (N/A) CARDIOVERSION (N/A) as a surgical intervention.  The patient's history has been reviewed, patient examined, no change in status, stable for surgery.  I have reviewed the patient's chart and labs.  Questions were answered to the patient's satisfaction.     [name]

## 2019-07-28 NOTE — Progress Notes (Signed)
  Echocardiogram Echocardiogram Transesophageal has been performed.  Jennette Dubin 07/28/2019, 8:27 AM

## 2019-07-28 NOTE — Progress Notes (Signed)
Discharge instructions given to patient with spouse at the bedside. Patient understands new medication regimen and follow up appointments. IV removed without complication.

## 2019-07-28 NOTE — Transfer of Care (Signed)
Immediate Anesthesia Transfer of Care Note  Patient: Alan [name]  Procedure(s) Performed: TRANSESOPHAGEAL ECHOCARDIOGRAM (TEE) (N/A ) CARDIOVERSION (N/A )  Patient Location: PACU  Anesthesia Type:General  Level of Consciousness: awake, alert  and oriented  Airway & Oxygen Therapy: Patient Spontanous Breathing  Post-op Assessment: Report given to RN, Post -op Vital signs reviewed and stable and Patient moving all extremities X 4  Post vital signs: Reviewed and stable  Last Vitals:  Vitals Value Taken Time  BP 94/71 07/28/19 0846  Temp    Pulse 81 07/28/19 0847  Resp 16 07/28/19 0847  SpO2 96 % 07/28/19 0847  Vitals shown include unvalidated device data.  Last Pain:  Vitals:   07/28/19 0715  TempSrc: Temporal  PainSc: 0-No pain      Patients Stated Pain Goal: 0 ([id number])  Complications: No apparent anesthesia complications

## 2019-07-29 ENCOUNTER — Telehealth: Payer: Self-pay | Admitting: Cardiovascular Disease

## 2019-07-29 NOTE — Telephone Encounter (Signed)
STAT if HR is under 50 or over 120 (normal HR is 60-100 beats per minute)  1) What is your heart rate?  2) Do you have a log of your heart rate readings (document readings) 114, 116  3) Do you have any other symptoms? He wanted to know if this was normal or should he been concerned.  Given his recent history

## 2019-07-29 NOTE — Telephone Encounter (Signed)
Unfortunately, I am not able to see him in the office until Monday at 12:00. Please add him on at that time. If he feels worse over the weekend, please go  to ED.

## 2019-07-29 NOTE — Telephone Encounter (Signed)
Pt updated and verbalized understanding. Appointment scheduled for Monday 6/7 with Dr. Loletha Grayer.

## 2019-07-29 NOTE — Telephone Encounter (Signed)
Spoke to pt who state he underwent a cardioversion yesterday 6/3 then discharged. He report last night he started experiencing some SOB, felt nauseous, and HR was 114. This morning he report HR was still elevated but it's currently 80 and state he is asymptomatic. Pt voiced that he is concerned he is back in a fib and worried he will become symptomatic later today.  Nurses attempted to find an appointment today for further evaluations but none available. Will route to MD recommendations.

## 2019-08-01 ENCOUNTER — Other Ambulatory Visit: Payer: Self-pay

## 2019-08-01 ENCOUNTER — Ambulatory Visit (INDEPENDENT_AMBULATORY_CARE_PROVIDER_SITE_OTHER): Payer: Medicare Other | Admitting: Cardiovascular Disease

## 2019-08-01 VITALS — BP 130/85 | HR 77 | Temp 97.0°F | Ht 72.0 in | Wt 173.4 lb

## 2019-08-01 DIAGNOSIS — Z9861 Coronary angioplasty status: Secondary | ICD-10-CM

## 2019-08-01 DIAGNOSIS — Z9889 Other specified postprocedural states: Secondary | ICD-10-CM

## 2019-08-01 DIAGNOSIS — I2510 Atherosclerotic heart disease of native coronary artery without angina pectoris: Secondary | ICD-10-CM | POA: Diagnosis not present

## 2019-08-01 DIAGNOSIS — Z953 Presence of xenogenic heart valve: Secondary | ICD-10-CM | POA: Diagnosis not present

## 2019-08-01 DIAGNOSIS — I25810 Atherosclerosis of coronary artery bypass graft(s) without angina pectoris: Secondary | ICD-10-CM | POA: Diagnosis not present

## 2019-08-01 MED ORDER — FUROSEMIDE 20 MG PO TABS: 20.0000 mg | ORAL_TABLET | Freq: Every day | ORAL | 3 refills | Status: DC

## 2019-08-01 MED ORDER — FUROSEMIDE 20 MG PO TABS: 40.0000 mg | ORAL_TABLET | Freq: Every day | ORAL | 3 refills | Status: DC

## 2019-08-01 NOTE — Patient Instructions (Addendum)
Medication Instructions:  INCREASE FUROSEMIDE TOP 40 MG ONCE DAILY= 2 OF THE 20 MG TABLETS ONCE DAILY  *If you need a refill on your cardiac medications before your next appointment, please call your pharmacy*   Lab Work: If you have labs (blood work) drawn today and your tests are completely normal, you will receive your results only by: Marland Kitchen MyChart Message (if you have MyChart) OR . A paper copy in the mail If you have any lab test that is abnormal or we need to change your treatment, we will call you to review the results.   Testing/Procedures: Your physician has requested that you have an echocardiogram. Echocardiography is a painless test that uses sound waves to create images of your heart. It provides your doctor with information about the size and shape of your heart and how well your heart's chambers and valves are working. This procedure takes approximately one hour. There are no restrictions for this procedure.SCHEDULE IN ONE [place]     Follow-Up: At [facility], you and your health needs are our priority.  As part of our continuing mission to provide you with exceptional heart care, we have created designated Provider Care Teams.  These Care Teams include your primary Cardiologist (physician) and Advanced Practice Providers (APPs -  Physician Assistants and Nurse Practitioners) who all work together to provide you with the care you need, when you need it.  We recommend signing up for the patient portal called "MyChart".  Sign up information is provided on this After Visit Summary.  MyChart is used to connect with patients for Virtual Visits (Telemedicine).  Patients are able to view lab/test results, encounter notes, upcoming appointments, etc.  Non-urgent messages can be sent to your provider as well.   To learn more about what you can do with MyChart, go to [email or web address].    Your next appointment:    Your physician recommends that you  schedule a follow-up appointment AFTER ECHO WITH DR Sallyanne Kuster

## 2019-08-01 NOTE — Progress Notes (Signed)
Patient ID: Alan [name], male   DOB: Mar 25, 1950, 69 y.o.   MRN: [id number] Patient ID: Alan [name], male   DOB: 10-04-1950, 69 y.o.   MRN: [id number]    Cardiology Office Note    Date:  08/01/2019   ID:  Alan [name], DOB Jul 07, 1950, MRN [id number]  PCP:  Aurea Graff, PA-C (Inactive)  Cardiologist:   Sanda Klein, MD   No chief complaint on file.   History of Present Illness:  Alan [name] is a 69 y.o. 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 He underwent aortic valve replacement with a biological prosthesis and mitral valve repair as well as a single-vessel LIMA to LAD bypass for incidentally discovered CAD. He also had a 90% stenosis in the distal segment of the left PDA that was not bypassed.  He had recurrent atrial flutter, required a stent to the left circumflex coronary artery and had transient severe mitral regurgitation in February 2020.  He was hospitalized on Jul 25 2019 with heart failure exacerbation and was found to have atrial flutter with rapid ventricular response.  He was unaware of palpitations and never had angina.  On June 3 he underwent TEE and cardioversion.  TEE showed severe central mitral insufficiency.  There was flow reversal in the pulmonary veins.  Left ventricular systolic function was preserved and there was normal function of the aortic valve prosthesis.  He was hypotensive transiently following his cardioversion.  He recovered quicker than he did the year before.  Ischemic evaluation was not performed.  Late last week, after discharge from the hospital he felt poorly and was worried that his atrial fibrillation/flutter had recurred.  His appointment was therefore scheduled today, but he is in sinus rhythm with occasional PACs.  He does not appear overtly dyspneic, he has not  had syncope or symptomatic hypotension.  He does not have palpitations.  On November 01, 2014, he underwent AVR with a bioprosthesis (23 mm MagnaEase),patch enlargement of the aortic root (Nicks' technique) and MV repair without annuloplasty ring, as well as single-vessel LIMA to LAD bypass for incidentally discovered CAD and clipping of the left atrial appendage (40 mm Atricure clip). Postop atrial flutter led to treatment with amiodarone and cardioversion on October 24 and a second cardioversion on January 11, 2015.  As the arrhythmia did not recur  anticoagulants were then discontinued, but were restarted when he presented with atrial flutter with 2: 1 AV block in January 2020.  April 12, 2018 received 2 stents to the proximal LAD upstream of the LIMA bypass and the left circumflex coronary artery (resolute onyx 2.030).  At that time his ischemic work-up was performed due to profound and protracted hypotension that did not respond to IV fluids and pressors following his elective cardioversion.  TEE showed severe  mitral insufficiency.  Cardiac catheterization Apr 12 2018 was followed by placement of stents in the native LAD artery upstream of the LIMA bypass and the left circumflex coronary artery (both stents were resolute onyx 2.0 x 30).  He improved rapidly thereafter and by physical exam his mitral insufficiency appeared markedly improved.  Follow-up echocardiogram showed only mild mitral insufficiency and normal left ventricular systolic function. In August 2020 he underwent a stress myocardial perfusion study that did not show any perfusion defects (although he did have "false positive" ST segment depression during Lexiscan infusion).  Past Medical History:  Diagnosis Date  . 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 Atrial flutter with rapid ventricular response ([place]) s/p TEE/DCCV 07/28/19 successful 10/19/2014  .  Coronary artery disease involving native coronary artery without angina pectoris 10/23/2014  . 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 Enterococcal bacteremia 10/19/2014  . Hepatitis   . Memory difficulty 02/06/2017  . Migraine 09/12/2015  . 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 Paroxysmal atrial fibrillation ([place]) 10/19/2014  . Paroxysmal atrial flutter ([place]) 10/19/2014  . Prostate cancer ([place])   . Protein-calorie malnutrition, severe ([place])   . Restless leg syndrome   . 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/2014   23 mm [facility] Ease bovine pericardial bioprosthetic tissue valve with bovine pericardial patch enlargement of aortic root  . 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 S/P mitral valve repair 11/01/2014   Debridement of vegetation on anterior leaflet  . 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 AORTIC ROOT ENLARGEMENT N/A 11/01/2014   Procedure: AORTIC ROOT ENLARGEMENT;  Surgeon: Rexene Alberts, MD;  Location: [place];  Service: Open Heart Surgery;  Laterality: N/A;  . AORTIC VALVE REPLACEMENT N/A 11/01/2014   Procedure: AORTIC VALVE REPLACEMENT (AVR);  Surgeon: Rexene Alberts, MD;  Location: [place];  Service: Open Heart Surgery;  Laterality: N/A;  . CARDIAC CATHETERIZATION N/A 10/23/2014   Procedure: Right/Left Heart Cath and Coronary Angiography;  Surgeon: Lorretta Harp, MD;  Location: [place] CV LAB;  Service: Cardiovascular;  Laterality: N/A;  . CARDIOVERSION N/A 12/18/2014   Procedure: CARDIOVERSION;  Surgeon: Pixie Casino, MD;  Location: [facility] ENDOSCOPY;  Service: Cardiovascular;  Laterality: N/A;  . CARDIOVERSION N/A 02/06/2015   Procedure: CARDIOVERSION;  Surgeon: Larey Dresser, MD;  Location: [place];  Service: Cardiovascular;  Laterality: N/A;  . CARDIOVERSION N/A 04/07/2018   Procedure:  CARDIOVERSION;  Surgeon: Sanda Klein, MD;  Location: [facility] ENDOSCOPY;  Service: Cardiovascular;  Laterality: N/A;  . CARDIOVERSION N/A 07/28/2019   Procedure: CARDIOVERSION;  Surgeon: Lelon Perla, MD;  Location: [facility] ENDOSCOPY;  Service: Cardiovascular;  Laterality: N/A;  . COLONOSCOPY N/A 10/25/2014   Procedure: COLONOSCOPY;  Surgeon: Arta Silence, MD;  Location: [facility] ENDOSCOPY;  Service: Endoscopy;  Laterality: N/A;  . CORONARY ARTERY BYPASS GRAFT N/A 11/01/2014   Procedure: CORONARY ARTERY BYPASS GRAFTING (CABG) x 1 using internal mammary artery;  Surgeon: Rexene Alberts, MD;  Location: [place];  Service: Open Heart Surgery;  Laterality: N/A;  . CORONARY CTO INTERVENTION N/A 04/12/2018   Procedure: CORONARY CTO INTERVENTION;  Surgeon: [name], MD;  Location: [place] CV LAB;  Service: Cardiovascular;  Laterality: N/A;  . CORONARY STENT INTERVENTION N/A 04/12/2018   Procedure: CORONARY STENT INTERVENTION;  Surgeon: [name], MD;  Location: [place] CV LAB;  Service: Cardiovascular;  Laterality: N/A;  . MITRAL VALVE REPAIR N/A 11/01/2014   Procedure: MITRAL VALVE REPAIR with no Ring;  Surgeon: Rexene Alberts, MD;  Location: [place];  Service: Open Heart Surgery;  Laterality: N/A;  . PROSTATECTOMY    . RIGHT/LEFT HEART CATH AND CORONARY/GRAFT ANGIOGRAPHY N/A 04/12/2018   Procedure: RIGHT/LEFT HEART CATH AND CORONARY/GRAFT ANGIOGRAPHY;  Surgeon: [name], MD;  Location: [place] CV LAB;  Service: Cardiovascular;  Laterality: N/A;  . TEE WITHOUT  CARDIOVERSION N/A 10/20/2014   Procedure: TRANSESOPHAGEAL ECHOCARDIOGRAM (TEE);  Surgeon: Josue Hector, MD;  Location: [place];  Service: Cardiovascular;  Laterality: N/A;  . TEE WITHOUT CARDIOVERSION N/A 11/01/2014   Procedure: TRANSESOPHAGEAL ECHOCARDIOGRAM (TEE);  Surgeon: Rexene Alberts, MD;  Location: [place];  Service: Open Heart Surgery;  Laterality: N/A;  . TEE WITHOUT CARDIOVERSION N/A 04/08/2018   Procedure:  TRANSESOPHAGEAL ECHOCARDIOGRAM (TEE);  Surgeon: Jerline Pain, MD;  Location: [facility] ENDOSCOPY;  Service: Cardiovascular;  Laterality: N/A;  . TEE WITHOUT CARDIOVERSION N/A 07/28/2019   Procedure: TRANSESOPHAGEAL ECHOCARDIOGRAM (TEE);  Surgeon: Lelon Perla, MD;  Location: [facility] ENDOSCOPY;  Service: Cardiovascular;  Laterality: N/A;    Current Outpatient Medications  Medication Sig Dispense Refill  . acetaminophen (TYLENOL) 325 MG tablet Take 2 tablets (650 mg total) by mouth every 4 (four) hours as needed for headache or mild pain.    Marland Kitchen atorvastatin (LIPITOR) 40 MG tablet Take 1 tablet (40 mg total) by mouth daily. 30 tablet 11  . Coenzyme Q10-Fish Oil-Vit E (CO-Q 10 OMEGA-3 FISH OIL) CAPS Take 1 capsule by mouth daily.    . furosemide (LASIX) 20 MG tablet Take 2 tablets (40 mg total) by mouth daily. 180 tablet 3  . gabapentin (NEURONTIN) 300 MG capsule Take 300 mg by mouth 2 (two) times daily. at dinner and at bedtime    . metoprolol tartrate (LOPRESSOR) 25 MG tablet Take 0.5 tablets (12.5 mg total) by mouth 2 (two) times daily. 30 tablet 6  . Multiple Vitamins-Minerals (PRESERVISION AREDS 2) CAPS Take 1 capsule by mouth every evening.     . nitroGLYCERIN (NITROSTAT) 0.4 MG SL tablet Place 1 tablet (0.4 mg total) under the tongue every 5 (five) minutes x 3 doses as needed for chest pain. 25 tablet 4  . potassium chloride SA (KLOR-CON) 20 MEQ tablet Take 1 tablet (20 mEq total) by mouth daily. 90 tablet 3  . Probiotic Product (PROBIOTIC PO) Take 1 capsule by mouth daily.    . rivaroxaban (XARELTO) 20 MG TABS tablet Take 1 tablet (20 mg total) by mouth daily. 30 tablet 6  . Turmeric 500 MG CAPS Take 500 mg by mouth every evening.     No current facility-administered medications for this visit.    Allergies:   Pollen extract   Social History   Socioeconomic History  . Marital status: Married    Spouse name: Not on file  . Number of children: 1  . Years of education: Master's  . Highest  education level: Not on file  Occupational History  . Occupation: Books[employer]    Employer: [employer]  Tobacco Use  . Smoking status: Former Research scientist (life sciences)  . Smokeless tobacco: Never Used  . Tobacco comment: Smoked a pipe age 66->28  Substance and Sexual Activity  . Alcohol use: No    Alcohol/week: 0.0 standard drinks    Comment: Drank from age 45->38  . Drug use: No  . Sexual activity: Not on file  Other Topics Concern  . Not on file  Social History Narrative   Lives at home w/ his wife   Right-handed   Caffeine: 2 cups daily   Social Determinants of Health   Financial Resource Strain:   . Difficulty of Paying Living Expenses:   Food Insecurity:   . Worried About Charity fundraiser in the Last Year:   . Arboriculturist in the Last Year:   Transportation Needs:   . Film/video editor (Medical):   Marland Kitchen  Lack of Transportation (Non-Medical):   Physical Activity:   . Days of Exercise per Week:   . Minutes of Exercise per Session:   Stress:   . Feeling of Stress :   Social Connections:   . Frequency of Communication with Friends and Family:   . Frequency of Social Gatherings with Friends and Family:   . Attends Religious Services:   . Active Member of Clubs or Organizations:   . Attends Archivist Meetings:   Marland Kitchen Marital Status:      Family History:  The patient's family history includes Stroke in his father.   ROS:   Please see the history of present illness.    ROS All other systems reviewed and are negative.   PHYSICAL EXAM:   VS:  BP 130/85   Pulse 77   Temp (!) 97 F (36.1 C)   Ht 6' (1.829 m)   Wt 173 lb 6.4 oz (78.7 kg)   SpO2 95%   BMI 23.52 kg/m       General: Alert, oriented x3, no distress, appears comfortable Head: no evidence of trauma, PERRL, EOMI, no exophtalmos or lid lag, no myxedema, no xanthelasma; normal ears, nose and oropharynx Neck: Elevated jugular venous pulsations about 8-9 cm with prominent V waves and hepatojugular reflux; brisk  carotid pulses without delay and no carotid bruits Chest: clear to auscultation, no signs of consolidation by percussion or palpation, normal fremitus, symmetrical and full respiratory excursions Cardiovascular: normal position and quality of the apical impulse, regular rhythm, normal first and second heart sounds, grade 3/6 holosystolic murmur heard prominently both at the apex and at the left lower sternal border, early peaking great 3/6 crescendo-decrescendo murmur at the aortic focus, no diastolic murmurs, rubs or gallops Abdomen: no tenderness or distention, no masses by palpation, no abnormal pulsatility or arterial bruits, normal bowel sounds, no hepatosplenomegaly Extremities: no clubbing, cyanosis or edema; 2+ radial, ulnar and brachial pulses bilaterally; 2+ right femoral, posterior tibial and dorsalis pedis pulses; 2+ left femoral, posterior tibial and dorsalis pedis pulses; no subclavian or femoral bruits Neurological: grossly nonfocal Psych: Normal mood and affect    Wt Readings from Last 3 Encounters:  08/01/19 173 lb 6.4 oz (78.7 kg)  07/28/19 169 lb 8.5 oz (76.9 kg)  06/14/19 169 lb 3.2 oz (76.7 kg)    Studies/Labs Reviewed:   EKG:  EKG is ordered today and shows sinus rhythm with PACs, left atrial abnormality, vertical axis, nonspecific ST segment scooping depression in leads II, 3, aVF, V5, V6.   Recent Labs: 07/25/2019: ALT 26; B Natriuretic Peptide 458.3; Hemoglobin 13.6; Platelets 213 07/28/2019: BUN 22; Creatinine, Ser 1.26; Potassium 4.3; Sodium 137   Lipid Panel    Component Value Date/Time   CHOL 154 03/06/2016 0954   TRIG 71 03/06/2016 0954   HDL 45 03/06/2016 0954   CHOLHDL 3.4 03/06/2016 0954   VLDL 14 03/06/2016 0954   LDLCALC 95 03/06/2016 0954   01/26/2019 Total cholesterol 119, HDL 45, triglycerides 83  ASSESSMENT:    1. History of mitral valve repair   2. Coronary artery disease involving coronary bypass graft of native heart without angina pectoris    3. S/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      PLAN:  In order of problems listed above:   1. CHF: Physical exam suggest that he is volume overloaded.  We will increase his furosemide to 40 mg daily.  Continue potassium supplements.  Reevaluate clinically and with echocardiogram towards  the end of the month.   2. Severe MR: Review of his TEE confirms that he has severe mitral regurgitation.  There are structural changes of the leaflets but the jet is central and there is clear leaflet malcoaptation.  Unclear why mitral regurgitation has recurred (whether it is progressive valve scarring versus functional MR due to papillary muscle ischemia). In 2020, acute onset of severe mitral insufficiency and subsequent resolution with coronary revascularization is consistent with ischemic mechanism.  Follow-up echo showed only mild MR. Today he still has a very loud MR murmur.  It is possible that he will require mitral valve repair, but I do not think he is a good candidate for surgery due to his previous sternotomy.  Would recommend reevaluation of MR with a limited transthoracic echo after he has been maintaining normal rhythm for a few weeks.  If there is still severe MR would recommend cardiac catheterization to make sure there is not an ischemic mechanism again.  He might be a MitraClip candidate, but I am worried that the valve looks restricted rather than redundant and of course it has been surgically repaired and is rather thickened and scarred.  3.  CAD: He did not have angina in the past either during his endocarditis episode or before he received 2 stents.  No longer on clopidogrel, but should be on aspirin 81 mg daily.  On statin with LDL cholesterol within target range (<70). 4. AFlutter: Possibly left atrial flutter (related to atriotomy), rather than typical cavotricuspid isthmus flutter.  In sinus rhythm with PACs today.  Would like to see whether the MR improves after long-term  maintenance of normal rhythm. He had his left atrial appendage clipped at the time of surgery, so it is less critical to provide uninterrupted anticoagulation, in the long run. 4. S/P AVR (bio) and MV repair: His aortic valve bioprosthesis is working well.   5. Depression: Appears to have stable mood today.  Medication Adjustments/Labs and Tests Ordered: Current medicines are reviewed at length with the patient today.  Concerns regarding medicines are outlined above.  Medication changes, Labs and Tests ordered today are listed below. Patient Instructions  Medication Instructions:  INCREASE FUROSEMIDE TOP 40 MG ONCE DAILY= 2 OF THE 20 MG TABLETS ONCE DAILY  *If you need a refill on your cardiac medications before your next appointment, please call your pharmacy*   Lab Work: If you have labs (blood work) drawn today and your tests are completely normal, you will receive your results only by: Marland Kitchen MyChart Message (if you have MyChart) OR . A paper copy in the mail If you have any lab test that is abnormal or we need to change your treatment, we will call you to review the results.   Testing/Procedures: Your physician has requested that you have an echocardiogram. Echocardiography is a painless test that uses sound waves to create images of your heart. It provides your doctor with information about the size and shape of your heart and how well your heart's chambers and valves are working. This procedure takes approximately one hour. There are no restrictions for this procedure.SCHEDULE IN ONE [place]     Follow-Up: At [facility], you and your health needs are our priority.  As part of our continuing mission to provide you with exceptional heart care, we have created designated Provider Care Teams.  These Care Teams include your primary Cardiologist (physician) and Advanced Practice Providers (APPs -  Physician Assistants and Nurse Practitioners) who all  work together to  provide you with the care you need, when you need it.  We recommend signing up for the patient portal called "MyChart".  Sign up information is provided on this After Visit Summary.  MyChart is used to connect with patients for Virtual Visits (Telemedicine).  Patients are able to view lab/test results, encounter notes, upcoming appointments, etc.  Non-urgent messages can be sent to your provider as well.   To learn more about what you can do with MyChart, go to [email or web address].    Your next appointment:    Your physician recommends that you schedule a follow-up appointment AFTER ECHO WITH DR Sinda Du, MD  08/01/2019 5:12 PM    [facility] [address], [place], [place]  [address] Phone: [phone]; Fax: [phone]

## 2019-08-10 ENCOUNTER — Ambulatory Visit: Payer: Medicare Other | Admitting: Cardiovascular Disease

## 2019-08-22 ENCOUNTER — Ambulatory Visit (HOSPITAL_COMMUNITY): Payer: Medicare Other | Attending: Cardiovascular Disease

## 2019-08-22 ENCOUNTER — Other Ambulatory Visit: Payer: Self-pay

## 2019-08-22 ENCOUNTER — Other Ambulatory Visit: Payer: Self-pay | Admitting: Cardiovascular Disease

## 2019-08-22 DIAGNOSIS — Z9889 Other specified postprocedural states: Secondary | ICD-10-CM | POA: Insufficient documentation

## 2019-08-22 DIAGNOSIS — Z953 Presence of xenogenic heart valve: Secondary | ICD-10-CM

## 2019-09-08 ENCOUNTER — Other Ambulatory Visit: Payer: Self-pay

## 2019-09-08 ENCOUNTER — Encounter: Payer: Self-pay | Admitting: Cardiovascular Disease

## 2019-09-08 ENCOUNTER — Ambulatory Visit (INDEPENDENT_AMBULATORY_CARE_PROVIDER_SITE_OTHER): Payer: Medicare Other | Admitting: Cardiovascular Disease

## 2019-09-08 VITALS — BP 98/60 | HR 74 | Ht 72.0 in | Wt 164.0 lb

## 2019-09-08 DIAGNOSIS — I484 Atypical atrial flutter: Secondary | ICD-10-CM | POA: Diagnosis not present

## 2019-09-08 DIAGNOSIS — Z9861 Coronary angioplasty status: Secondary | ICD-10-CM | POA: Diagnosis not present

## 2019-09-08 DIAGNOSIS — I25810 Atherosclerosis of coronary artery bypass graft(s) without angina pectoris: Secondary | ICD-10-CM

## 2019-09-08 DIAGNOSIS — I2510 Atherosclerotic heart disease of native coronary artery without angina pectoris: Secondary | ICD-10-CM | POA: Diagnosis not present

## 2019-09-08 DIAGNOSIS — Z953 Presence of xenogenic heart valve: Secondary | ICD-10-CM

## 2019-09-08 DIAGNOSIS — I340 Nonrheumatic mitral (valve) insufficiency: Secondary | ICD-10-CM | POA: Diagnosis not present

## 2019-09-08 DIAGNOSIS — Z01818 Encounter for other preprocedural examination: Secondary | ICD-10-CM

## 2019-09-08 NOTE — Progress Notes (Addendum)
Patient ID: Alan [name], male   DOB: 06-04-1950, 69 y.o.   MRN: [id number] Patient ID: Alan [name], male   DOB: [birth year], 69 y.o.   MRN: [id number]    Cardiology Office Note    Date:  09/09/2019   ID:  Alan [name], DOB 02-06-1951, MRN [id number]  PCP:  Aurea Graff, PA-C (Inactive)  Cardiologist:   Sanda Klein, MD   CC: Mitral insufficiency  History of Present Illness:  Alan [name] is a 69 y.o. 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 He underwent aortic valve replacement with a biological prosthesis and mitral valve repair as well as a single-vessel LIMA to LAD bypass for incidentally discovered CAD. He also had a 90% stenosis in the distal segment of the left PDA that was not bypassed.  He had recurrent atrial flutter, required a stent to the left circumflex coronary artery and had transient severe mitral regurgitation in February 2020. He was hospitalized on Jul 25 2019 with heart failure exacerbation and was found to have atrial flutter with rapid ventricular response. On June 3 he underwent TEE and cardioversion.  TEE showed severe central mitral insufficiency.  There was flow reversal in the pulmonary veins.  Left ventricular systolic function was preserved and there was normal function of the aortic valve prosthesis. Ischemic evaluation was not performed.   TTE obtained on August 22, 2019 that showed stable EF at 55-60%. Mitral valve evaluation showed severe centrally directed MR with primary leaflet dysfunction due to restriction.  Today, Alan [name] states he continues to feel poorly. He feels SOB when waking up in the morning. He denies orthopnea though and is able to sleep with less pillows than previously. He is interested in repair of his mitral valve but was hoping to avoid open heart  surgery again and opt for the Mitraclip. He is open to meeting with cardiothoracic surgery to discuss next steps.   On November 01, 2014, he underwent AVR with a bioprosthesis (23 mm MagnaEase), patch enlargement of the aortic root (Nicks' technique) and MV repair without annuloplasty ring, as well as single-vessel LIMA to LAD bypass for incidentally discovered CAD and clipping of the left atrial appendage (40 mm Atricure clip). Postop atrial flutter led to treatment with amiodarone and cardioversion on October 24 and a second cardioversion on January 11, 2015.  As the arrhythmia did not recur  anticoagulants were then discontinued, but were restarted when he presented with atrial flutter with 2: 1 AV block in January 2020.  April 12, 2018 received 2 stents to the proximal LAD upstream of the LIMA bypass and the left circumflex coronary artery (resolute onyx 2.030).  At that time his ischemic work-up was performed due to profound and protracted hypotension that did not respond to IV fluids and pressors following his elective cardioversion.  TEE showed severe  mitral insufficiency.  Cardiac catheterization Apr 12 2018 was followed by placement of stents in the native LAD artery upstream of the LIMA bypass and the left circumflex coronary artery (both stents were resolute onyx 2.0 x 30).  He improved rapidly thereafter and by physical exam his mitral insufficiency appeared markedly improved.  Follow-up echocardiogram showed only mild mitral insufficiency and normal left ventricular systolic function. In August 2020 he underwent a stress myocardial perfusion study that did not show any perfusion defects (although he did have "false positive" ST  segment depression during Lexiscan infusion).    Past Medical History:  Diagnosis Date  . 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 Atrial flutter with rapid ventricular response ([place]) s/p  TEE/DCCV 07/28/19 successful 10/19/2014  . Coronary artery disease involving native coronary artery without angina pectoris 10/23/2014  . 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 Enterococcal bacteremia 10/19/2014  . Hepatitis   . Memory difficulty 02/06/2017  . Migraine 09/12/2015  . 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 Paroxysmal atrial fibrillation ([place]) 10/19/2014  . Paroxysmal atrial flutter ([place]) 10/19/2014  . Prostate cancer ([place])   . Protein-calorie malnutrition, severe ([place])   . Restless leg syndrome   . 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/2014   23 mm [facility] Ease bovine pericardial bioprosthetic tissue valve with bovine pericardial patch enlargement of aortic root  . 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 S/P mitral valve repair 11/01/2014   Debridement of vegetation on anterior leaflet  . 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 AORTIC ROOT ENLARGEMENT N/A 11/01/2014   Procedure: AORTIC ROOT ENLARGEMENT;  Surgeon: Rexene Alberts, MD;  Location: [place];  Service: Open Heart Surgery;  Laterality: N/A;  . AORTIC VALVE REPLACEMENT N/A 11/01/2014   Procedure: AORTIC VALVE REPLACEMENT (AVR);  Surgeon: Rexene Alberts, MD;  Location: [place];  Service: Open Heart Surgery;  Laterality: N/A;  . CARDIAC CATHETERIZATION N/A 10/23/2014   Procedure: Right/Left Heart Cath and Coronary Angiography;  Surgeon: Lorretta Harp, MD;  Location: [place] CV LAB;  Service: Cardiovascular;  Laterality: N/A;  . CARDIOVERSION N/A 12/18/2014   Procedure: CARDIOVERSION;  Surgeon: Pixie Casino, MD;  Location: [facility] ENDOSCOPY;  Service: Cardiovascular;  Laterality: N/A;  . CARDIOVERSION N/A 02/06/2015   Procedure: CARDIOVERSION;  Surgeon: Larey Dresser, MD;  Location: [place];  Service: Cardiovascular;  Laterality: N/A;  . CARDIOVERSION  N/A 04/07/2018   Procedure: CARDIOVERSION;  Surgeon: Sanda Klein, MD;  Location: [facility] ENDOSCOPY;  Service: Cardiovascular;  Laterality: N/A;  . CARDIOVERSION N/A 07/28/2019   Procedure: CARDIOVERSION;  Surgeon: Lelon Perla, MD;  Location: [facility] ENDOSCOPY;  Service: Cardiovascular;  Laterality: N/A;  . COLONOSCOPY N/A 10/25/2014   Procedure: COLONOSCOPY;  Surgeon: Arta Silence, MD;  Location: [facility] ENDOSCOPY;  Service: Endoscopy;  Laterality: N/A;  . CORONARY ARTERY BYPASS GRAFT N/A 11/01/2014   Procedure: CORONARY ARTERY BYPASS GRAFTING (CABG) x 1 using internal mammary artery;  Surgeon: Rexene Alberts, MD;  Location: [place];  Service: Open Heart Surgery;  Laterality: N/A;  . CORONARY CTO INTERVENTION N/A 04/12/2018   Procedure: CORONARY CTO INTERVENTION;  Surgeon: [name], MD;  Location: [place] CV LAB;  Service: Cardiovascular;  Laterality: N/A;  . CORONARY STENT INTERVENTION N/A 04/12/2018   Procedure: CORONARY STENT INTERVENTION;  Surgeon: [name], MD;  Location: [place] CV LAB;  Service: Cardiovascular;  Laterality: N/A;  . MITRAL VALVE REPAIR N/A 11/01/2014   Procedure: MITRAL VALVE REPAIR with no Ring;  Surgeon: Rexene Alberts, MD;  Location: [place];  Service: Open Heart Surgery;  Laterality: N/A;  . PROSTATECTOMY    . RIGHT/LEFT HEART CATH AND CORONARY/GRAFT ANGIOGRAPHY N/A 04/12/2018   Procedure: RIGHT/LEFT HEART CATH AND CORONARY/GRAFT ANGIOGRAPHY;  Surgeon: [name], MD;  Location: [place] CV LAB;  Service:  Cardiovascular;  Laterality: N/A;  . TEE WITHOUT CARDIOVERSION N/A 10/20/2014   Procedure: TRANSESOPHAGEAL ECHOCARDIOGRAM (TEE);  Surgeon: Josue Hector, MD;  Location: [place];  Service: Cardiovascular;  Laterality: N/A;  . TEE WITHOUT CARDIOVERSION N/A 11/01/2014   Procedure: TRANSESOPHAGEAL ECHOCARDIOGRAM (TEE);  Surgeon: Rexene Alberts, MD;  Location: [place];  Service: Open Heart Surgery;  Laterality: N/A;  . TEE WITHOUT CARDIOVERSION N/A  04/08/2018   Procedure: TRANSESOPHAGEAL ECHOCARDIOGRAM (TEE);  Surgeon: Jerline Pain, MD;  Location: [facility] ENDOSCOPY;  Service: Cardiovascular;  Laterality: N/A;  . TEE WITHOUT CARDIOVERSION N/A 07/28/2019   Procedure: TRANSESOPHAGEAL ECHOCARDIOGRAM (TEE);  Surgeon: Lelon Perla, MD;  Location: [facility] ENDOSCOPY;  Service: Cardiovascular;  Laterality: N/A;    Current Outpatient Medications  Medication Sig Dispense Refill  . acetaminophen (TYLENOL) 325 MG tablet Take 2 tablets (650 mg total) by mouth every 4 (four) hours as needed for headache or mild pain. (Patient taking differently: Take 325 mg by mouth every 4 (four) hours as needed for headache or mild pain. )    . atorvastatin (LIPITOR) 40 MG tablet Take 1 tablet (40 mg total) by mouth daily. 30 tablet 11  . furosemide (LASIX) 20 MG tablet Take 2 tablets (40 mg total) by mouth daily. 180 tablet 3  . gabapentin (NEURONTIN) 300 MG capsule Take 300 mg by mouth 2 (two) times daily. at dinner and at bedtime    . metoprolol tartrate (LOPRESSOR) 25 MG tablet Take 0.5 tablets (12.5 mg total) by mouth 2 (two) times daily. 30 tablet 6  . Multiple Vitamins-Minerals (PRESERVISION AREDS 2) CAPS Take 1 capsule by mouth every evening.     . nitroGLYCERIN (NITROSTAT) 0.4 MG SL tablet Place 1 tablet (0.4 mg total) under the tongue every 5 (five) minutes x 3 doses as needed for chest pain. 25 tablet 4  . potassium chloride SA (KLOR-CON) 20 MEQ tablet Take 1 tablet (20 mEq total) by mouth daily. 90 tablet 3  . Probiotic Product (PROBIOTIC PO) Take 1 capsule by mouth 2 (two) times daily.     . rivaroxaban (XARELTO) 20 MG TABS tablet Take 1 tablet (20 mg total) by mouth daily. 30 tablet 6  . Turmeric 500 MG CAPS Take 500 mg by mouth every evening.    Marland Kitchen albuterol (VENTOLIN HFA) 108 (90 Base) MCG/ACT inhaler Inhale 2 puffs into the lungs every 6 (six) hours as needed for wheezing or shortness of breath.    . doxylamine, Sleep, (UNISOM) 25 MG tablet Take 12.5 mg by  mouth at bedtime.    . Omega-3 Fatty Acids (OMEGA-3 PO) Take 1 capsule by mouth daily.     No current facility-administered medications for this visit.   Allergies:   Pollen extract   Social History   Socioeconomic History  . Marital status: Married    Spouse name: Not on file  . Number of children: 1  . Years of education: Master's  . Highest education level: Not on file  Occupational History  . Occupation: Booksstore    Employer: [employer]  Tobacco Use  . Smoking status: Former Research scientist (life sciences)  . Smokeless tobacco: Never Used  . Tobacco comment: Smoked a pipe age 62->28  Substance and Sexual Activity  . Alcohol use: No    Alcohol/week: 0.0 standard drinks    Comment: Drank from age 59->38  . Drug use: No  . Sexual activity: Not on file  Other Topics Concern  . Not on file  Social History Narrative   Lives at  home w/ his wife   Right-handed   Caffeine: 2 cups daily   Social Determinants of Health   Financial Resource Strain:   . Difficulty of Paying Living Expenses:   Food Insecurity:   . Worried About Charity fundraiser in the Last Year:   . Arboriculturist in the Last Year:   Transportation Needs:   . Film/video editor (Medical):   Marland Kitchen Lack of Transportation (Non-Medical):   Physical Activity:   . Days of Exercise per Week:   . Minutes of Exercise per Session:   Stress:   . Feeling of Stress :   Social Connections:   . Frequency of Communication with Friends and Family:   . Frequency of Social Gatherings with Friends and Family:   . Attends Religious Services:   . Active Member of Clubs or Organizations:   . Attends Archivist Meetings:   Marland Kitchen Marital Status:      Family History:  The patient's family history includes Stroke in his father.   ROS:   Please see the history of present illness.    All other systems reviewed and are negative.  PHYSICAL EXAM:   VS:  BP 98/60   Pulse 74   Ht 6' (1.829 m)   Wt 164 lb (74.4 kg)   SpO2 96%   BMI 22.24  kg/m     General: Alert, oriented x3, no distress, appears comfortable Head: no evidence of trauma, PERRL, EOMI, no exophtalmos or lid lag, no myxedema, no xanthelasma; normal ears, nose and oropharynx Chest: Clear to auscultation bilaterally.  Cardiovascular: 3/6 holosystolic murmur heard best at the apex that is not significantly changed with hand gripping. No diastolic murmurs. Regular rate and rhythm. Normal S1 and S2. Extremities: no clubbing, cyanosis or edema; 2+ radial, ulnar and brachial pulses bilaterally; 2+ right femoral, posterior tibial and dorsalis pedis pulses; 2+ left femoral, posterior tibial and dorsalis pedis pulses Neurological: grossly nonfocal Psych: Normal mood and affect  Wt Readings from Last 3 Encounters:  09/08/19 164 lb (74.4 kg)  08/01/19 173 lb 6.4 oz (78.7 kg)  07/28/19 169 lb 8.5 oz (76.9 kg)    Studies/Labs Reviewed:   EKG:  No EKG ordered today.   Recent Labs: 07/25/2019: ALT 26; B Natriuretic Peptide 458.3 09/08/2019: BUN 21; Creatinine, Ser 1.13; Hemoglobin 13.7; Platelets 181; Potassium 4.7; Sodium 139   Lipid Panel    Component Value Date/Time   CHOL 154 03/06/2016 0954   TRIG 71 03/06/2016 0954   HDL 45 03/06/2016 0954   CHOLHDL 3.4 03/06/2016 0954   VLDL 14 03/06/2016 0954   LDLCALC 95 03/06/2016 0954   01/26/2019 Total cholesterol 119, HDL 45, triglycerides 83  ASSESSMENT:    1. Nonrheumatic mitral (valve) insufficiency   2. Coronary artery disease involving coronary bypass graft of native heart without angina pectoris   3. Atypical atrial flutter ([place])   4. History of aortic valve replacement with bioprosthetic valve   5. Pre-op evaluation     PLAN:  In order of problems listed above:     1. Severe MR: TEE in June 2021 revealed severe MR after reoccurrence of uncontrolled atrial flutter. 1 month after successful cardioversion, TTE was repeated to assess for any recovery his mitral function. Unfortunately, results confirm that  Alan [name] continues to have severe mitral regurgitation despite maintenance of sinus rhythm and stabilization of EF. At this time, he will likely require surgical intervention. Medication management has been difficult due to  patient's hypotension at baseline. Patient is symptomatic and interested in repair. Unfortunately, valve appears restricted rather than redundant, making him unlikely a candidate for Mitraclip. Case was discussed with colleagues at the [place] [facility]; consensus was in agreement that Mitraclip would not be beneficial for patient. Although recovery process will be difficult as it has in the past for Alan [name], we discussed that MR will only worsen if not intervened on surgically. He expressed understanding. Will start the process of evaluation for mitral valve replacement. Patient will need L/R cath prior to meeting with Dr. Roxy Manns.  2.  CAD: No anginal symptoms reported. Continues on aspirin 81 mg daily.  On statin with LDL cholesterol within target range (<70). 3. AFlutter: Stable at this time. Current medication management includes Metoprolol and Xarelto.  4. S/P AVR (bio) and MV repair: Stable .    I have seen and examined the patient along with Jose Persia, MD.  I have reviewed the chart, notes and new data.  I agree with her PA/NP's note.  Key new complaints: NYHA class 3a dyspnea, no edema/orthopnea Key examination changes: elevated JVP 5-6 cm, prominent V waves; holosystolic murmur at apex radiating to axilla and at LLSB Key new findings / data: reviewed TEE and transthoracic echo findings together.   PLAN: Highly symptomatic severe MR with moderate PAH and recurrent atrial arrhythmia. Not a good candidate for mitraclip due to restricted leaflet movement and high mitral gradients at baseline. Recommend R and L cardiac cath followed by referral to Dr. Roxy Manns to discuss MV replacement and posible MAZE. Surgery would be a higher risk procedure due to  previous sternotomy. The cardiac cath procedure has been fully reviewed with the patient and written informed consent has been obtained.   Sanda Klein, MD, [place] [phone] 09/09/2019, 1:18 PM   Medication Adjustments/Labs and Tests Ordered: Current medicines are reviewed at length with the patient today.  Concerns regarding medicines are outlined above.  Medication changes, Labs and Tests ordered today are listed below.  Patient Instructions  Medication Instructions:  No changes *If you need a refill on your cardiac medications before your next appointment, please call your pharmacy*  Testing/Procedures: Your physician has requested that you have a cardiac catheterization. Cardiac catheterization is used to diagnose and/or treat various heart conditions. Doctors may recommend this procedure for a number of different reasons. The most common reason is to evaluate chest pain. Chest pain can be a symptom of coronary artery disease (CAD), and cardiac catheterization can show whether plaque is narrowing or blocking your heart's arteries. This procedure is also used to evaluate the valves, as well as measure the blood flow and oxygen levels in different parts of your heart. For further information please visit [email or web address].[email or web address]. Please follow instruction sheet, as given.   Follow-Up: At [facility], you and your health needs are our priority.  As part of our continuing mission to provide you with exceptional heart care, we have created designated Provider Care Teams.  These Care Teams include your primary Cardiologist (physician) and Advanced Practice Providers (APPs -  Physician Assistants and Nurse Practitioners) who all work together to provide you with the care you need, when you need it.  We recommend signing up for the patient portal called "MyChart".  Sign up information is provided on this After Visit Summary.  MyChart is used to connect with patients for Virtual  Visits (Telemedicine).  Patients are able to view lab/test results, encounter notes, upcoming appointments, etc.  Non-urgent messages  can be sent to your provider as well.   To learn more about what you can do with MyChart, go to [email or web address].    Your next appointment:   1 month(s)  The format for your next appointment:   In Person  Provider:   You may see Sanda Klein, MD or one of the following Advanced Practice Providers on your designated Care Team:    Almyra Deforest, PA-C  Fabian Sharp, [place] or   Roby Lofts, [place]    Other Instructions    [place] [address] Dept: [place]: [place]  09/08/2019  You are scheduled for a Cardiac Catheterization on Tuesday, July 20 with Dr. Peter Martinique.  1. Please arrive at the [facility] (Main Entrance A) at [facility]: [address] [place], [place] [address] at 8:30 AM (This time is two hours before your procedure to ensure your preparation). Free valet parking service is available.   Special note: Every effort is made to have your procedure done on time. Please understand that emergencies sometimes delay scheduled procedures.  2. Diet: Do not eat solid foods after midnight.  The patient may have clear liquids until 5am upon the day of the procedure.  3. Labs: You will need to have blood drawn today:BMET and CBC  4. Medication instructions in preparation for your procedure: Hold the Furosemide the morning of the procedure Hold the Potassium the morning of the procedure Hold the Xarelto two days prior to the procedure (hold the 18th and 19th)  On the morning of your procedure, take your Aspirin and any morning medicines NOT listed above.  You may use sips of water.  5. Plan for one night stay--bring personal belongings. 6. Bring a current list of your  medications and current insurance cards. 7. You MUST have a responsible person to drive you home. 8. Someone MUST be with you the first 24 hours after you arrive home or your discharge will be delayed. 9. Please wear clothes that are easy to get on and off and wear slip-on shoes.  Thank you for allowing [place] to care for you!   -- [place] [facility]     Signed, Dr. Jose Persia Internal Medicine PGY-2  09/09/2019, 1:18 PM

## 2019-09-08 NOTE — H&P (View-Only) (Signed)
Patient ID: Alan [name], male   DOB: 1950/12/14, 68 y.o.   MRN: [id number] Patient ID: Alan [name], male   DOB: May 28, 1950, 69 y.o.   MRN: [id number]    Cardiology Office Note    Date:  09/09/2019   ID:  Alan [name], DOB September 28, 1950, MRN [id number]  PCP:  Aurea Graff, PA-C (Inactive)  Cardiologist:   Sanda Klein, MD   CC: Mitral insufficiency  History of Present Illness:  Alan [name] is a 69 y.o. 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 He underwent aortic valve replacement with a biological prosthesis and mitral valve repair as well as a single-vessel LIMA to LAD bypass for incidentally discovered CAD. He also had a 90% stenosis in the distal segment of the left PDA that was not bypassed.  He had recurrent atrial flutter, required a stent to the left circumflex coronary artery and had transient severe mitral regurgitation in February 2020. He was hospitalized on Jul 25 2019 with heart failure exacerbation and was found to have atrial flutter with rapid ventricular response. On June 3 he underwent TEE and cardioversion.  TEE showed severe central mitral insufficiency.  There was flow reversal in the pulmonary veins.  Left ventricular systolic function was preserved and there was normal function of the aortic valve prosthesis. Ischemic evaluation was not performed.   TTE obtained on August 22, 2019 that showed stable EF at 55-60%. Mitral valve evaluation showed severe centrally directed MR with primary leaflet dysfunction due to restriction.  Today, Alan [name] states he continues to feel poorly. He feels SOB when waking up in the morning. He denies orthopnea though and is able to sleep with less pillows than previously. He is interested in repair of his mitral valve but was hoping to avoid open heart  surgery again and opt for the Mitraclip. He is open to meeting with cardiothoracic surgery to discuss next steps.   On November 01, 2014, he underwent AVR with a bioprosthesis (23 mm MagnaEase), patch enlargement of the aortic root (Nicks' technique) and MV repair without annuloplasty ring, as well as single-vessel LIMA to LAD bypass for incidentally discovered CAD and clipping of the left atrial appendage (40 mm Atricure clip). Postop atrial flutter led to treatment with amiodarone and cardioversion on October 24 and a second cardioversion on January 11, 2015.  As the arrhythmia did not recur  anticoagulants were then discontinued, but were restarted when he presented with atrial flutter with 2: 1 AV block in January 2020.  April 12, 2018 received 2 stents to the proximal LAD upstream of the LIMA bypass and the left circumflex coronary artery (resolute onyx 2.030).  At that time his ischemic work-up was performed due to profound and protracted hypotension that did not respond to IV fluids and pressors following his elective cardioversion.  TEE showed severe  mitral insufficiency.  Cardiac catheterization Apr 12 2018 was followed by placement of stents in the native LAD artery upstream of the LIMA bypass and the left circumflex coronary artery (both stents were resolute onyx 2.0 x 30).  He improved rapidly thereafter and by physical exam his mitral insufficiency appeared markedly improved.  Follow-up echocardiogram showed only mild mitral insufficiency and normal left ventricular systolic function. In August 2020 he underwent a stress myocardial perfusion study that did not show any perfusion defects (although he did have "false positive" ST  segment depression during Lexiscan infusion).    Past Medical History:  Diagnosis Date  . 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 Atrial flutter with rapid ventricular response ([place]) s/p  TEE/DCCV 07/28/19 successful 10/19/2014  . Coronary artery disease involving native coronary artery without angina pectoris 10/23/2014  . 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 Enterococcal bacteremia 10/19/2014  . Hepatitis   . Memory difficulty 02/06/2017  . Migraine 09/12/2015  . 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 Paroxysmal atrial fibrillation ([place]) 10/19/2014  . Paroxysmal atrial flutter ([place]) 10/19/2014  . Prostate cancer ([place])   . Protein-calorie malnutrition, severe ([place])   . Restless leg syndrome   . 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/2014   23 mm [facility] Ease bovine pericardial bioprosthetic tissue valve with bovine pericardial patch enlargement of aortic root  . 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 S/P mitral valve repair 11/01/2014   Debridement of vegetation on anterior leaflet  . 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 AORTIC ROOT ENLARGEMENT N/A 11/01/2014   Procedure: AORTIC ROOT ENLARGEMENT;  Surgeon: Rexene Alberts, MD;  Location: [place];  Service: Open Heart Surgery;  Laterality: N/A;  . AORTIC VALVE REPLACEMENT N/A 11/01/2014   Procedure: AORTIC VALVE REPLACEMENT (AVR);  Surgeon: Rexene Alberts, MD;  Location: [place];  Service: Open Heart Surgery;  Laterality: N/A;  . CARDIAC CATHETERIZATION N/A 10/23/2014   Procedure: Right/Left Heart Cath and Coronary Angiography;  Surgeon: Lorretta Harp, MD;  Location: [place] CV LAB;  Service: Cardiovascular;  Laterality: N/A;  . CARDIOVERSION N/A 12/18/2014   Procedure: CARDIOVERSION;  Surgeon: Pixie Casino, MD;  Location: [facility] ENDOSCOPY;  Service: Cardiovascular;  Laterality: N/A;  . CARDIOVERSION N/A 02/06/2015   Procedure: CARDIOVERSION;  Surgeon: Larey Dresser, MD;  Location: [place];  Service: Cardiovascular;  Laterality: N/A;  . CARDIOVERSION  N/A 04/07/2018   Procedure: CARDIOVERSION;  Surgeon: Sanda Klein, MD;  Location: [facility] ENDOSCOPY;  Service: Cardiovascular;  Laterality: N/A;  . CARDIOVERSION N/A 07/28/2019   Procedure: CARDIOVERSION;  Surgeon: Lelon Perla, MD;  Location: [facility] ENDOSCOPY;  Service: Cardiovascular;  Laterality: N/A;  . COLONOSCOPY N/A 10/25/2014   Procedure: COLONOSCOPY;  Surgeon: Arta Silence, MD;  Location: [facility] ENDOSCOPY;  Service: Endoscopy;  Laterality: N/A;  . CORONARY ARTERY BYPASS GRAFT N/A 11/01/2014   Procedure: CORONARY ARTERY BYPASS GRAFTING (CABG) x 1 using internal mammary artery;  Surgeon: Rexene Alberts, MD;  Location: [place];  Service: Open Heart Surgery;  Laterality: N/A;  . CORONARY CTO INTERVENTION N/A 04/12/2018   Procedure: CORONARY CTO INTERVENTION;  Surgeon: [name], MD;  Location: [place] CV LAB;  Service: Cardiovascular;  Laterality: N/A;  . CORONARY STENT INTERVENTION N/A 04/12/2018   Procedure: CORONARY STENT INTERVENTION;  Surgeon: [name], MD;  Location: [place] CV LAB;  Service: Cardiovascular;  Laterality: N/A;  . MITRAL VALVE REPAIR N/A 11/01/2014   Procedure: MITRAL VALVE REPAIR with no Ring;  Surgeon: Rexene Alberts, MD;  Location: [place];  Service: Open Heart Surgery;  Laterality: N/A;  . PROSTATECTOMY    . RIGHT/LEFT HEART CATH AND CORONARY/GRAFT ANGIOGRAPHY N/A 04/12/2018   Procedure: RIGHT/LEFT HEART CATH AND CORONARY/GRAFT ANGIOGRAPHY;  Surgeon: [name], MD;  Location: [place] CV LAB;  Service:  Cardiovascular;  Laterality: N/A;  . TEE WITHOUT CARDIOVERSION N/A 10/20/2014   Procedure: TRANSESOPHAGEAL ECHOCARDIOGRAM (TEE);  Surgeon: Josue Hector, MD;  Location: [place];  Service: Cardiovascular;  Laterality: N/A;  . TEE WITHOUT CARDIOVERSION N/A 11/01/2014   Procedure: TRANSESOPHAGEAL ECHOCARDIOGRAM (TEE);  Surgeon: Rexene Alberts, MD;  Location: [place];  Service: Open Heart Surgery;  Laterality: N/A;  . TEE WITHOUT CARDIOVERSION N/A  04/08/2018   Procedure: TRANSESOPHAGEAL ECHOCARDIOGRAM (TEE);  Surgeon: Jerline Pain, MD;  Location: [facility] ENDOSCOPY;  Service: Cardiovascular;  Laterality: N/A;  . TEE WITHOUT CARDIOVERSION N/A 07/28/2019   Procedure: TRANSESOPHAGEAL ECHOCARDIOGRAM (TEE);  Surgeon: Lelon Perla, MD;  Location: [facility] ENDOSCOPY;  Service: Cardiovascular;  Laterality: N/A;    Current Outpatient Medications  Medication Sig Dispense Refill  . acetaminophen (TYLENOL) 325 MG tablet Take 2 tablets (650 mg total) by mouth every 4 (four) hours as needed for headache or mild pain. (Patient taking differently: Take 325 mg by mouth every 4 (four) hours as needed for headache or mild pain. )    . atorvastatin (LIPITOR) 40 MG tablet Take 1 tablet (40 mg total) by mouth daily. 30 tablet 11  . furosemide (LASIX) 20 MG tablet Take 2 tablets (40 mg total) by mouth daily. 180 tablet 3  . gabapentin (NEURONTIN) 300 MG capsule Take 300 mg by mouth 2 (two) times daily. at dinner and at bedtime    . metoprolol tartrate (LOPRESSOR) 25 MG tablet Take 0.5 tablets (12.5 mg total) by mouth 2 (two) times daily. 30 tablet 6  . Multiple Vitamins-Minerals (PRESERVISION AREDS 2) CAPS Take 1 capsule by mouth every evening.     . nitroGLYCERIN (NITROSTAT) 0.4 MG SL tablet Place 1 tablet (0.4 mg total) under the tongue every 5 (five) minutes x 3 doses as needed for chest pain. 25 tablet 4  . potassium chloride SA (KLOR-CON) 20 MEQ tablet Take 1 tablet (20 mEq total) by mouth daily. 90 tablet 3  . Probiotic Product (PROBIOTIC PO) Take 1 capsule by mouth 2 (two) times daily.     . rivaroxaban (XARELTO) 20 MG TABS tablet Take 1 tablet (20 mg total) by mouth daily. 30 tablet 6  . Turmeric 500 MG CAPS Take 500 mg by mouth every evening.    Marland Kitchen albuterol (VENTOLIN HFA) 108 (90 Base) MCG/ACT inhaler Inhale 2 puffs into the lungs every 6 (six) hours as needed for wheezing or shortness of breath.    . doxylamine, Sleep, (UNISOM) 25 MG tablet Take 12.5 mg by  mouth at bedtime.    . Omega-3 Fatty Acids (OMEGA-3 PO) Take 1 capsule by mouth daily.     No current facility-administered medications for this visit.   Allergies:   Pollen extract   Social History   Socioeconomic History  . Marital status: Married    Spouse name: Not on file  . Number of children: 1  . Years of education: Master's  . Highest education level: Not on file  Occupational History  . Occupation: Booksstore    Employer: [employer]  Tobacco Use  . Smoking status: Former Research scientist (life sciences)  . Smokeless tobacco: Never Used  . Tobacco comment: Smoked a pipe age 55->28  Substance and Sexual Activity  . Alcohol use: No    Alcohol/week: 0.0 standard drinks    Comment: Drank from age 73->38  . Drug use: No  . Sexual activity: Not on file  Other Topics Concern  . Not on file  Social History Narrative   Lives at  home w/ his wife   Right-handed   Caffeine: 2 cups daily   Social Determinants of Health   Financial Resource Strain:   . Difficulty of Paying Living Expenses:   Food Insecurity:   . Worried About Charity fundraiser in the Last Year:   . Arboriculturist in the Last Year:   Transportation Needs:   . Film/video editor (Medical):   Marland Kitchen Lack of Transportation (Non-Medical):   Physical Activity:   . Days of Exercise per Week:   . Minutes of Exercise per Session:   Stress:   . Feeling of Stress :   Social Connections:   . Frequency of Communication with Friends and Family:   . Frequency of Social Gatherings with Friends and Family:   . Attends Religious Services:   . Active Member of Clubs or Organizations:   . Attends Archivist Meetings:   Marland Kitchen Marital Status:      Family History:  The patient's family history includes Stroke in his father.   ROS:   Please see the history of present illness.    All other systems reviewed and are negative.  PHYSICAL EXAM:   VS:  BP 98/60   Pulse 74   Ht 6' (1.829 m)   Wt 164 lb (74.4 kg)   SpO2 96%   BMI 22.24  kg/m     General: Alert, oriented x3, no distress, appears comfortable Head: no evidence of trauma, PERRL, EOMI, no exophtalmos or lid lag, no myxedema, no xanthelasma; normal ears, nose and oropharynx Chest: Clear to auscultation bilaterally.  Cardiovascular: 3/6 holosystolic murmur heard best at the apex that is not significantly changed with hand gripping. No diastolic murmurs. Regular rate and rhythm. Normal S1 and S2. Extremities: no clubbing, cyanosis or edema; 2+ radial, ulnar and brachial pulses bilaterally; 2+ right femoral, posterior tibial and dorsalis pedis pulses; 2+ left femoral, posterior tibial and dorsalis pedis pulses Neurological: grossly nonfocal Psych: Normal mood and affect  Wt Readings from Last 3 Encounters:  09/08/19 164 lb (74.4 kg)  08/01/19 173 lb 6.4 oz (78.7 kg)  07/28/19 169 lb 8.5 oz (76.9 kg)    Studies/Labs Reviewed:   EKG:  No EKG ordered today.   Recent Labs: 07/25/2019: ALT 26; B Natriuretic Peptide 458.3 09/08/2019: BUN 21; Creatinine, Ser 1.13; Hemoglobin 13.7; Platelets 181; Potassium 4.7; Sodium 139   Lipid Panel    Component Value Date/Time   CHOL 154 03/06/2016 0954   TRIG 71 03/06/2016 0954   HDL 45 03/06/2016 0954   CHOLHDL 3.4 03/06/2016 0954   VLDL 14 03/06/2016 0954   LDLCALC 95 03/06/2016 0954   01/26/2019 Total cholesterol 119, HDL 45, triglycerides 83  ASSESSMENT:    1. Nonrheumatic mitral (valve) insufficiency   2. Coronary artery disease involving coronary bypass graft of native heart without angina pectoris   3. Atypical atrial flutter ([place])   4. History of aortic valve replacement with bioprosthetic valve   5. Pre-op evaluation     PLAN:  In order of problems listed above:     1. Severe MR: TEE in June 2021 revealed severe MR after reoccurrence of uncontrolled atrial flutter. 1 month after successful cardioversion, TTE was repeated to assess for any recovery his mitral function. Unfortunately, results confirm that  Alan [name] continues to have severe mitral regurgitation despite maintenance of sinus rhythm and stabilization of EF. At this time, he will likely require surgical intervention. Medication management has been difficult due to  patient's hypotension at baseline. Patient is symptomatic and interested in repair. Unfortunately, valve appears restricted rather than redundant, making him unlikely a candidate for Mitraclip. Case was discussed with colleagues at the [facility]; consensus was in agreement that Mitraclip would not be beneficial for patient. Although recovery process will be difficult as it has in the past for Alan [name], we discussed that MR will only worsen if not intervened on surgically. He expressed understanding. Will start the process of evaluation for mitral valve replacement. Patient will need L/R cath prior to meeting with Dr. Roxy Manns.  2.  CAD: No anginal symptoms reported. Continues on aspirin 81 mg daily.  On statin with LDL cholesterol within target range (<70). 3. AFlutter: Stable at this time. Current medication management includes Metoprolol and Xarelto.  4. S/P AVR (bio) and MV repair: Stable .    I have seen and examined the patient along with Jose Persia, MD.  I have reviewed the chart, notes and new data.  I agree with her PA/NP's note.  Key new complaints: NYHA class 3a dyspnea, no edema/orthopnea Key examination changes: elevated JVP 5-6 cm, prominent V waves; holosystolic murmur at apex radiating to axilla and at LLSB Key new findings / data: reviewed TEE and transthoracic echo findings together.   PLAN: Highly symptomatic severe MR with moderate PAH and recurrent atrial arrhythmia. Not a good candidate for mitraclip due to restricted leaflet movement and high mitral gradients at baseline. Recommend R and L cardiac cath followed by referral to Dr. Roxy Manns to discuss MV replacement and posible MAZE. Surgery would be a higher risk procedure due to  previous sternotomy. The cardiac cath procedure has been fully reviewed with the patient and written informed consent has been obtained.   Sanda Klein, MD, [place] [phone] 09/09/2019, 1:18 PM   Medication Adjustments/Labs and Tests Ordered: Current medicines are reviewed at length with the patient today.  Concerns regarding medicines are outlined above.  Medication changes, Labs and Tests ordered today are listed below.  Patient Instructions  Medication Instructions:  No changes *If you need a refill on your cardiac medications before your next appointment, please call your pharmacy*  Testing/Procedures: Your physician has requested that you have a cardiac catheterization. Cardiac catheterization is used to diagnose and/or treat various heart conditions. Doctors may recommend this procedure for a number of different reasons. The most common reason is to evaluate chest pain. Chest pain can be a symptom of coronary artery disease (CAD), and cardiac catheterization can show whether plaque is narrowing or blocking your heart's arteries. This procedure is also used to evaluate the valves, as well as measure the blood flow and oxygen levels in different parts of your heart. For further information please visit HugeFiesta.[email or web address]. Please follow instruction sheet, as given.   Follow-Up: At [facility], you and your health needs are our priority.  As part of our continuing mission to provide you with exceptional heart care, we have created designated Provider Care Teams.  These Care Teams include your primary Cardiologist (physician) and Advanced Practice Providers (APPs -  Physician Assistants and Nurse Practitioners) who all work together to provide you with the care you need, when you need it.  We recommend signing up for the patient portal called "MyChart".  Sign up information is provided on this After Visit Summary.  MyChart is used to connect with patients for Virtual  Visits (Telemedicine).  Patients are able to view lab/test results, encounter notes, upcoming appointments, etc.  Non-urgent messages  can be sent to your provider as well.   To learn more about what you can do with MyChart, go to [email or web address].    Your next appointment:   1 month(s)  The format for your next appointment:   In Person  Provider:   You may see Sanda Klein, MD or one of the following Advanced Practice Providers on your designated Care Team:    Almyra Deforest, PA-C  Fabian Sharp, [place] or   Roby Lofts, [place]    Other Instructions    [place] [address] Dept: [place]: [place]  09/08/2019  You are scheduled for a Cardiac Catheterization on Tuesday, July 20 with Dr. Peter Martinique.  1. Please arrive at the [facility] (Main Entrance A) at [facility]: [address] [place], [place] [address] at 8:30 AM (This time is two hours before your procedure to ensure your preparation). Free valet parking service is available.   Special note: Every effort is made to have your procedure done on time. Please understand that emergencies sometimes delay scheduled procedures.  2. Diet: Do not eat solid foods after midnight.  The patient may have clear liquids until 5am upon the day of the procedure.  3. Labs: You will need to have blood drawn today:BMET and CBC  4. Medication instructions in preparation for your procedure: Hold the Furosemide the morning of the procedure Hold the Potassium the morning of the procedure Hold the Xarelto two days prior to the procedure (hold the 18th and 19th)  On the morning of your procedure, take your Aspirin and any morning medicines NOT listed above.  You may use sips of water.  5. Plan for one night stay--bring personal belongings. 6. Bring a current list of your  medications and current insurance cards. 7. You MUST have a responsible person to drive you home. 8. Someone MUST be with you the first 24 hours after you arrive home or your discharge will be delayed. 9. Please wear clothes that are easy to get on and off and wear slip-on shoes.  Thank you for allowing [place] to care for you!   -- [place] [facility]     Signed, Dr. Jose Persia Internal Medicine PGY-2  09/09/2019, 1:18 PM

## 2019-09-08 NOTE — Patient Instructions (Signed)
Medication Instructions:  No changes *If you need a refill on your cardiac medications before your next appointment, please call your pharmacy*  Testing/Procedures: Your physician has requested that you have a cardiac catheterization. Cardiac catheterization is used to diagnose and/or treat various heart conditions. Doctors may recommend this procedure for a number of different reasons. The most common reason is to evaluate chest pain. Chest pain can be a symptom of coronary artery disease (CAD), and cardiac catheterization can show whether plaque is narrowing or blocking your heart's arteries. This procedure is also used to evaluate the valves, as well as measure the blood flow and oxygen levels in different parts of your heart. For further information please visit HugeFiesta.tn. Please follow instruction sheet, as given.   Follow-Up: At [facility], you and your health needs are our priority.  As part of our continuing mission to provide you with exceptional heart care, we have created designated Provider Care Teams.  These Care Teams include your primary Cardiologist (physician) and Advanced Practice Providers (APPs -  Physician Assistants and Nurse Practitioners) who all work together to provide you with the care you need, when you need it.  We recommend signing up for the patient portal called "MyChart".  Sign up information is provided on this After Visit Summary.  MyChart is used to connect with patients for Virtual Visits (Telemedicine).  Patients are able to view lab/test results, encounter notes, upcoming appointments, etc.  Non-urgent messages can be sent to your provider as well.   To learn more about what you can do with MyChart, go to [email or web address].    Your next appointment:   1 month(s)  The format for your next appointment:   In Person  Provider:   You may see Sanda Klein, MD or one of the following Advanced Practice Providers on your designated Care Team:      Almyra Deforest, PA-C  Fabian Sharp, [place] or   Roby Lofts, [place]    Other Instructions    [place] [address] Dept: [place]: [place]  09/08/2019  You are scheduled for a Cardiac Catheterization on Tuesday, July 20 with Dr. Peter Martinique.  1. Please arrive at the [facility] (Main Entrance A) at [facility]: [address] [place], [place] [address] at 8:30 AM (This time is two hours before your procedure to ensure your preparation). Free valet parking service is available.   Special note: Every effort is made to have your procedure done on time. Please understand that emergencies sometimes delay scheduled procedures.  2. Diet: Do not eat solid foods after midnight.  The patient may have clear liquids until 5am upon the day of the procedure.  3. Labs: You will need to have blood drawn today:BMET and CBC  4. Medication instructions in preparation for your procedure: Hold the Furosemide the morning of the procedure Hold the Potassium the morning of the procedure Hold the Xarelto two days prior to the procedure (hold the 18th and 19th)  On the morning of your procedure, take your Aspirin and any morning medicines NOT listed above.  You may use sips of water.  5. Plan for one night stay--bring personal belongings. 6. Bring a current list of your medications and current insurance cards. 7. You MUST have a responsible person to drive you home. 8. Someone MUST be with you the first 24 hours after you arrive home or  your discharge will be delayed. 9. Please wear clothes that are easy to get on and off and wear slip-on shoes.  Thank you for allowing [place] to care for you!   -- [place] [facility] services

## 2019-09-09 LAB — CBC
Hematocrit: 43.7 % (ref 37.5–51.0)
Hemoglobin: 13.7 g/dL (ref 13.0–17.7)
MCH: 26.7 pg (ref 26.6–33.0)
MCHC: 31.4 g/dL — ABNORMAL LOW (ref 31.5–35.7)
MCV: 85 fL (ref 79–97)
Platelets: 181 10*3/uL (ref 150–450)
RBC: 5.14 x10E6/uL (ref 4.14–5.80)
RDW: 14.7 % (ref 11.6–15.4)
WBC: 6.3 10*3/uL (ref 3.4–10.8)

## 2019-09-09 LAB — BASIC METABOLIC PANEL
BUN/Creatinine Ratio: 19 (ref 10–24)
BUN: 21 mg/dL (ref 8–27)
CO2: 24 mmol/L (ref 20–29)
Calcium: 9.6 mg/dL (ref 8.6–10.2)
Chloride: 103 mmol/L (ref 96–106)
Creatinine, Ser: 1.13 mg/dL (ref 0.76–1.27)
GFR calc Af Amer: 75 mL/min/{1.73_m2} (ref >59)
GFR calc non Af Amer: 65 mL/min/{1.73_m2} (ref >59)
Glucose: 115 mg/dL — ABNORMAL HIGH (ref 65–99)
Potassium: 4.7 mmol/L (ref 3.5–5.2)
Sodium: 139 mmol/L (ref 134–144)

## 2019-09-12 ENCOUNTER — Telehealth: Payer: Self-pay | Admitting: *Deleted

## 2019-09-12 ENCOUNTER — Other Ambulatory Visit: Payer: Self-pay | Admitting: *Deleted

## 2019-09-12 DIAGNOSIS — I340 Nonrheumatic mitral (valve) insufficiency: Secondary | ICD-10-CM

## 2019-09-12 MED ORDER — SODIUM CHLORIDE 0.9% FLUSH: 3.0000 mL | Freq: Two times a day (BID) | INTRAVENOUS | Status: DC

## 2019-09-12 NOTE — Telephone Encounter (Signed)
Pt contacted pre-catheterization scheduled at [facility] for: Tuesday September 13, 2019 10:30 AM Verified arrival time and place: [facility]) at: 8:30 AM   No solid food after midnight prior to cath, clear liquids until 5 AM day of procedure.  Hold: Xarelto-none 09/11/19 until post procedure Lasix/KCl -AM of procedure   Except hold medications AM meds can be  taken pre-cath with sips of water including: ASA 81 mg   Confirmed patient has responsible adult to drive home post procedure and observe 24 hours after arriving home: yes  You are allowed ONE visitor in the waiting room during your procedure. Both you and your visitor must wear a mask once you enter the hospital.      COVID-19 Pre-Screening Questions:  . In the past 14 days have you had a new cough associated with shortness of breath, fever (100.4 or greater) or sudden loss of taste or sense of smell? no . In the past 14 days have you been around anyone with known Covid 19? no . Any international travel in the past 14 days? no . Have you been vaccinated for COVID-19? Yes, see immunization history

## 2019-09-13 ENCOUNTER — Encounter (HOSPITAL_COMMUNITY)
Admission: RE | Disposition: A | Discharge status: Home / Self Care | Payer: Self-pay | Source: Home / Self Care | Attending: Cardiology

## 2019-09-13 ENCOUNTER — Ambulatory Visit (HOSPITAL_COMMUNITY)
Admission: RE | Admit: 2019-09-13 | Discharge: 2019-09-13 | Disposition: A | Discharge status: Home / Self Care | Payer: Medicare Other | Attending: Cardiology | Admitting: Cardiology

## 2019-09-13 ENCOUNTER — Other Ambulatory Visit: Payer: Self-pay

## 2019-09-13 DIAGNOSIS — Z79899 Other long term (current) drug therapy: Secondary | ICD-10-CM | POA: Diagnosis not present

## 2019-09-13 DIAGNOSIS — I272 Pulmonary hypertension, unspecified: Secondary | ICD-10-CM | POA: Diagnosis not present

## 2019-09-13 DIAGNOSIS — G2581 Restless legs syndrome: Secondary | ICD-10-CM | POA: Diagnosis not present

## 2019-09-13 DIAGNOSIS — I2510 Atherosclerotic heart disease of native coronary artery without angina pectoris: Secondary | ICD-10-CM | POA: Diagnosis not present

## 2019-09-13 DIAGNOSIS — Z7901 Long term (current) use of anticoagulants: Secondary | ICD-10-CM | POA: Insufficient documentation

## 2019-09-13 DIAGNOSIS — Z87891 Personal history of nicotine dependence: Secondary | ICD-10-CM | POA: Insufficient documentation

## 2019-09-13 DIAGNOSIS — I4892 Unspecified atrial flutter: Secondary | ICD-10-CM | POA: Diagnosis present

## 2019-09-13 DIAGNOSIS — I484 Atypical atrial flutter: Secondary | ICD-10-CM | POA: Insufficient documentation

## 2019-09-13 DIAGNOSIS — I480 Paroxysmal atrial fibrillation: Secondary | ICD-10-CM | POA: Insufficient documentation

## 2019-09-13 DIAGNOSIS — I340 Nonrheumatic mitral (valve) insufficiency: Secondary | ICD-10-CM | POA: Diagnosis present

## 2019-09-13 DIAGNOSIS — Z951 Presence of aortocoronary bypass graft: Secondary | ICD-10-CM | POA: Diagnosis not present

## 2019-09-13 DIAGNOSIS — Z952 Presence of prosthetic heart valve: Secondary | ICD-10-CM | POA: Diagnosis not present

## 2019-09-13 DIAGNOSIS — I70209 Unspecified atherosclerosis of native arteries of extremities, unspecified extremity: Secondary | ICD-10-CM | POA: Diagnosis present

## 2019-09-13 DIAGNOSIS — Z9861 Coronary angioplasty status: Secondary | ICD-10-CM

## 2019-09-13 HISTORY — PX: RIGHT/LEFT HEART CATH AND CORONARY ANGIOGRAPHY: CATH118266

## 2019-09-13 LAB — POCT I-STAT EG7
Acid-base deficit: 1 mmol/L (ref 0.0–2.0)
Acid-base deficit: 1 mmol/L (ref 0.0–2.0)
Bicarbonate: 24.8 mmol/L (ref 20.0–28.0)
Bicarbonate: 25.2 mmol/L (ref 20.0–28.0)
Calcium, Ion: 1.26 mmol/L (ref 1.15–1.40)
Calcium, Ion: 1.31 mmol/L (ref 1.15–1.40)
HCT: 40 % (ref 39.0–52.0)
HCT: 41 % (ref 39.0–52.0)
Hemoglobin: 13.6 g/dL (ref 13.0–17.0)
Hemoglobin: 13.9 g/dL (ref 13.0–17.0)
O2 Saturation: 67 %
O2 Saturation: 70 %
Potassium: 3.9 mmol/L (ref 3.5–5.1)
Potassium: 4 mmol/L (ref 3.5–5.1)
Sodium: 143 mmol/L (ref 135–145)
Sodium: 143 mmol/L (ref 135–145)
TCO2: 26 mmol/L (ref 22–32)
TCO2: 27 mmol/L (ref 22–32)
pCO2, Ven: 44.7 mmHg (ref 44.0–60.0)
pCO2, Ven: 46.1 mmHg (ref 44.0–60.0)
pH, Ven: 7.346 (ref 7.250–7.430)
pH, Ven: 7.352 (ref 7.250–7.430)
pO2, Ven: 37 mmHg (ref 32.0–45.0)
pO2, Ven: 39 mmHg (ref 32.0–45.0)

## 2019-09-13 LAB — POCT I-STAT 7, (LYTES, BLD GAS, ICA,H+H)
Acid-base deficit: 2 mmol/L (ref 0.0–2.0)
Bicarbonate: 23.1 mmol/L (ref 20.0–28.0)
Calcium, Ion: 1.27 mmol/L (ref 1.15–1.40)
HCT: 39 % (ref 39.0–52.0)
Hemoglobin: 13.3 g/dL (ref 13.0–17.0)
O2 Saturation: 98 %
Potassium: 3.9 mmol/L (ref 3.5–5.1)
Sodium: 143 mmol/L (ref 135–145)
TCO2: 24 mmol/L (ref 22–32)
pCO2 arterial: 40 mmHg (ref 32.0–48.0)
pH, Arterial: 7.369 (ref 7.350–7.450)
pO2, Arterial: 107 mmHg (ref 83.0–108.0)

## 2019-09-13 SURGERY — RIGHT/LEFT HEART CATH AND CORONARY ANGIOGRAPHY
Anesthesia: LOCAL

## 2019-09-13 MED ORDER — VERAPAMIL HCL 2.5 MG/ML IV SOLN
INTRAVENOUS | Status: DC | PRN
  Administered 2019-09-13: 10 mL via INTRA_ARTERIAL

## 2019-09-13 MED ORDER — HEPARIN (PORCINE) IN NACL 1000-0.9 UT/500ML-% IV SOLN
INTRAVENOUS | Status: DC | PRN
  Administered 2019-09-13 (×2): 500 mL

## 2019-09-13 MED ORDER — SODIUM CHLORIDE 0.9% FLUSH: 3.0000 mL | INTRAVENOUS | Status: DC | PRN

## 2019-09-13 MED ORDER — HEPARIN SODIUM (PORCINE) 1000 UNIT/ML IJ SOLN
INTRAMUSCULAR | Status: DC | PRN
  Administered 2019-09-13: 4000 [IU] via INTRAVENOUS

## 2019-09-13 MED ORDER — ASPIRIN 81 MG PO CHEW: 81.0000 mg | CHEWABLE_TABLET | ORAL | Status: DC

## 2019-09-13 MED ORDER — LIDOCAINE HCL (PF) 1 % IJ SOLN
INTRAMUSCULAR | Status: DC | PRN
  Administered 2019-09-13: 5 mL

## 2019-09-13 MED ORDER — RIVAROXABAN 20 MG PO TABS: 20.0000 mg | ORAL_TABLET | Freq: Every day | ORAL | 6 refills | Status: DC

## 2019-09-13 MED ORDER — VERAPAMIL HCL 2.5 MG/ML IV SOLN
INTRAVENOUS | Status: AC
  Filled 2019-09-13: qty 2

## 2019-09-13 MED ORDER — MIDAZOLAM HCL 2 MG/2ML IJ SOLN
INTRAMUSCULAR | Status: DC | PRN
  Administered 2019-09-13: 1 mg via INTRAVENOUS

## 2019-09-13 MED ORDER — IOHEXOL 350 MG/ML SOLN
INTRAVENOUS | Status: AC
  Filled 2019-09-13: qty 1

## 2019-09-13 MED ORDER — LIDOCAINE HCL (PF) 1 % IJ SOLN
INTRAMUSCULAR | Status: AC
  Filled 2019-09-13: qty 30

## 2019-09-13 MED ORDER — IOHEXOL 350 MG/ML SOLN
INTRAVENOUS | Status: DC | PRN
  Administered 2019-09-13: 70 mL

## 2019-09-13 MED ORDER — FENTANYL CITRATE (PF) 100 MCG/2ML IJ SOLN
INTRAMUSCULAR | Status: DC | PRN
  Administered 2019-09-13: 25 ug via INTRAVENOUS

## 2019-09-13 MED ORDER — SODIUM CHLORIDE 0.9 % IV SOLN: 250.0000 mL | INTRAVENOUS | Status: DC | PRN

## 2019-09-13 MED ORDER — HEPARIN (PORCINE) IN NACL 1000-0.9 UT/500ML-% IV SOLN
INTRAVENOUS | Status: AC
  Filled 2019-09-13: qty 1000

## 2019-09-13 MED ORDER — SODIUM CHLORIDE 0.9 % WEIGHT BASED INFUSION
3.0000 mL/kg/h | INTRAVENOUS | Status: AC
  Administered 2019-09-13: 3 mL/kg/h via INTRAVENOUS

## 2019-09-13 MED ORDER — FENTANYL CITRATE (PF) 100 MCG/2ML IJ SOLN
INTRAMUSCULAR | Status: AC
  Filled 2019-09-13: qty 2

## 2019-09-13 MED ORDER — MIDAZOLAM HCL 2 MG/2ML IJ SOLN
INTRAMUSCULAR | Status: AC
  Filled 2019-09-13: qty 2

## 2019-09-13 MED ORDER — HEPARIN SODIUM (PORCINE) 1000 UNIT/ML IJ SOLN
INTRAMUSCULAR | Status: AC
  Filled 2019-09-13: qty 1

## 2019-09-13 MED ORDER — SODIUM CHLORIDE 0.9 % WEIGHT BASED INFUSION: 1.0000 mL/kg/h | INTRAVENOUS | Status: DC

## 2019-09-13 SURGICAL SUPPLY — 13 items
CATH INFINITI 5FR MULTPACK ANG (CATHETERS) ×1 IMPLANT
CATH LAUNCHER 6FR AL.75 (CATHETERS) ×1 IMPLANT
CATH SWAN GANZ 7F STRAIGHT (CATHETERS) ×1 IMPLANT
DEVICE RAD COMP TR BAND LRG (VASCULAR PRODUCTS) ×1 IMPLANT
GLIDESHEATH SLEND SS 6F .021 (SHEATH) ×1 IMPLANT
GLIDESHEATH SLENDER 7FR .021G (SHEATH) ×1 IMPLANT
GUIDEWIRE INQWIRE 1.5J.035X260 (WIRE) IMPLANT
INQWIRE 1.5J .035X260CM (WIRE) ×2
KIT HEART LEFT (KITS) ×2 IMPLANT
PACK CARDIAC CATHETERIZATION (CUSTOM PROCEDURE TRAY) ×2 IMPLANT
SYR MEDRAD MARK 7 150ML (SYRINGE) ×2 IMPLANT
TRANSDUCER W/STOPCOCK (MISCELLANEOUS) ×2 IMPLANT
TUBING CIL FLEX 10 FLL-RA (TUBING) ×2 IMPLANT

## 2019-09-13 NOTE — Interval H&P Note (Signed)
History and Physical Interval Note:  09/13/2019 10:55 AM  Alan [name]  has presented today for surgery, with the diagnosis of mitral regurgitation.  The various methods of treatment have been discussed with the patient and family. After consideration of risks, benefits and other options for treatment, the patient has consented to  Procedure(s): RIGHT/LEFT HEART CATH AND CORONARY ANGIOGRAPHY (N/A) as a surgical intervention.  The patient's history has been reviewed, patient examined, no change in status, stable for surgery.  I have reviewed the patient's chart and labs.  Questions were answered to the patient's satisfaction.     Collier [facility] 09/13/2019 10:55 AM

## 2019-09-13 NOTE — Discharge Instructions (Signed)
DRINK PLENTY OF FLUIDS OVER THE NEXT 2-3 DAYS. Radial Site Care  This sheet gives you information about how to care for yourself after your procedure. Your health care provider may also give you more specific instructions. If you have problems or questions, contact your health care provider. What can I expect after the procedure? After the procedure, it is common to have:  Bruising and tenderness at the catheter insertion area. Follow these instructions at home: Medicines  Take over-the-counter and prescription medicines only as told by your health care provider. Insertion site care  Follow instructions from your health care provider about how to take care of your insertion site. Make sure you: ? Wash your hands with soap and water before you change your bandage (dressing). If soap and water are not available, use hand sanitizer. ? Change your dressing as told by your health care provider. ? Leave stitches (sutures), skin glue, or adhesive strips in place. These skin closures may need to stay in place for 2 weeks or longer. If adhesive strip edges start to loosen and curl up, you may trim the loose edges. Do not remove adhesive strips completely unless your health care provider tells you to do that.  Check your insertion site every day for signs of infection. Check for: ? Redness, swelling, or pain. ? Fluid or blood. ? Pus or a bad smell. ? Warmth.  Do not take baths, swim, or use a hot tub until your health care provider approves.  You may shower 24-48 hours after the procedure, or as directed by your health care provider. ? Remove the dressing and gently wash the site with plain soap and water. ? Pat the area dry with a clean towel. ? Do not rub the site. That could cause bleeding.  Do not apply powder or lotion to the site. Activity   For 24 hours after the procedure, or as directed by your health care provider: ? Do not flex or bend the affected arm. ? Do not push or pull  heavy objects with the affected arm. ? Do not drive yourself home from the hospital or clinic. You may drive 24 hours after the procedure unless your health care provider tells you not to. ? Do not operate machinery or power tools.  Do not lift anything that is heavier than 10 lb (4.5 kg), or the limit that you are told, until your health care provider says that it is safe.  Ask your health care provider when it is okay to: ? Return to work or school. ? Resume usual physical activities or sports. ? Resume sexual activity. General instructions  If the catheter site starts to bleed, raise your arm and put firm pressure on the site. If the bleeding does not stop, get help right away. This is a medical emergency.  If you went home on the same day as your procedure, a responsible adult should be with you for the first 24 hours after you arrive home.  Keep all follow-up visits as told by your health care provider. This is important. Contact a health care provider if:  You have a fever.  You have redness, swelling, or yellow drainage around your insertion site. Get help right away if:  You have unusual pain at the radial site.  The catheter insertion area swells very fast.  The insertion area is bleeding, and the bleeding does not stop when you hold steady pressure on the area.  Your arm or hand becomes pale, cool, tingly,   or numb. These symptoms may represent a serious problem that is an emergency. Do not wait to see if the symptoms will go away. Get medical help right away. Call your local emergency services (911 in the [place] Do not drive yourself to the hospital. Summary  After the procedure, it is common to have bruising and tenderness at the site.  Follow instructions from your health care provider about how to take care of your radial site wound. Check the wound every day for signs of infection.  Do not lift anything that is heavier than 10 lb (4.5 kg), or the limit that you are  told, until your health care provider says that it is safe. This information is not intended to replace advice given to you by your health care provider. Make sure you discuss any questions you have with your health care provider. Document Revised: 03/18/2017 Document Reviewed: 03/18/2017 Elsevier Patient Education  2020 [employer]  

## 2019-09-13 NOTE — Progress Notes (Signed)
Thank you :)

## 2019-09-14 ENCOUNTER — Encounter (HOSPITAL_COMMUNITY): Payer: Self-pay | Admitting: Cardiology

## 2019-09-21 ENCOUNTER — Other Ambulatory Visit: Payer: Self-pay | Admitting: *Deleted

## 2019-09-21 ENCOUNTER — Encounter: Payer: Self-pay | Admitting: Thoracic Surgery (Cardiothoracic Vascular Surgery)

## 2019-09-21 ENCOUNTER — Institutional Professional Consult (permissible substitution) (INDEPENDENT_AMBULATORY_CARE_PROVIDER_SITE_OTHER): Payer: Medicare Other | Admitting: Thoracic Surgery (Cardiothoracic Vascular Surgery)

## 2019-09-21 ENCOUNTER — Other Ambulatory Visit: Payer: Self-pay | Admitting: Thoracic Surgery (Cardiothoracic Vascular Surgery)

## 2019-09-21 ENCOUNTER — Other Ambulatory Visit: Payer: Self-pay

## 2019-09-21 VITALS — BP 131/86 | HR 75 | Resp 20 | Ht 72.0 in | Wt 168.8 lb

## 2019-09-21 DIAGNOSIS — Z9861 Coronary angioplasty status: Secondary | ICD-10-CM

## 2019-09-21 DIAGNOSIS — I071 Rheumatic tricuspid insufficiency: Secondary | ICD-10-CM | POA: Insufficient documentation

## 2019-09-21 DIAGNOSIS — I340 Nonrheumatic mitral (valve) insufficiency: Secondary | ICD-10-CM

## 2019-09-21 DIAGNOSIS — Z9889 Other specified postprocedural states: Secondary | ICD-10-CM

## 2019-09-21 DIAGNOSIS — I2510 Atherosclerotic heart disease of native coronary artery without angina pectoris: Secondary | ICD-10-CM

## 2019-09-21 DIAGNOSIS — Z951 Presence of aortocoronary bypass graft: Secondary | ICD-10-CM

## 2019-09-21 DIAGNOSIS — Z953 Presence of xenogenic heart valve: Secondary | ICD-10-CM

## 2019-09-21 DIAGNOSIS — I480 Paroxysmal atrial fibrillation: Secondary | ICD-10-CM

## 2019-09-21 DIAGNOSIS — I4892 Unspecified atrial flutter: Secondary | ICD-10-CM

## 2019-09-21 MED ORDER — AMOXICILLIN 250 MG PO CAPS: 1000.0000 mg | ORAL_CAPSULE | Freq: Once | ORAL | 10 refills | Status: AC

## 2019-09-21 NOTE — Patient Instructions (Signed)
Stop taking Xarelto at least 7 days prior to surgery  Continue taking all other medications without change through the day before surgery.  Make sure to bring all of your medications with you when you come for your Pre-Admission Testing appointment at [facility] Short-Stay Department.  Have nothing to eat or drink after midnight the night before surgery.  On the morning of surgery do not take any medications.  At your appointment for Pre-Admission Testing at the [facility] Short-Stay Department you will be asked to sign permission forms for your upcoming surgery.  By definition your signature on these forms implies that you and/or your designee provide full informed consent for your planned surgical procedure(s), that alternative treatment options have been discussed, that you understand and accept any and all potential risks, and that you have some understanding of what to expect for your post-operative convalescence.  For any major cardiac surgical procedure potential operative risks include but are not limited to at least some risk of death, stroke or other neurologic complication, myocardial infarction, congestive heart failure, respiratory failure, renal failure, bleeding requiring blood transfusion and/or reexploration, irregular heart rhythm, heart block or bradycardia requiring permanent pacemaker, pneumonia, pericardial effusion, pleural effusion, wound infection, pulmonary embolus or other thromboembolic complication, chronic pain, or other complications related to the specific procedure(s) performed.  Please call to schedule a follow-up appointment in our office prior to surgery if you have any unresolved questions about your planned surgical procedure, the associated risks, alternative treatment options, and/or expectations for your post-operative recovery.

## 2019-09-21 NOTE — Progress Notes (Signed)
Thanks, Cub

## 2019-09-21 NOTE — Progress Notes (Signed)
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 status post aortic valve replacement with root enlargement using a stented bioprosthetic tissue valve, coronary artery disease status post coronary artery bypass grafting and more recently status post PCI and stenting of the posterior descending coronary artery, mitral regurgitation, chronic diastolic congestive heart failure, recurrent atrial fibrillation and atrial flutter status post DC cardioversion on multiple occasions currently maintaining sinus rhythm on long-term anticoagulation using Xarelto, restless leg syndrome, and remote history of prostate cancer who has been referred for surgical consultation to discuss treatment options for management of severe symptomatic mitral regurgitation.  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  At the time he was incidentally found to have multivessel coronary artery disease.  He underwent aortic valve replacement using a 23 mm Edwards Magna Ease stented bioprosthetic tissue valve with bovine pericardial patch enlargement of the aortic root, mitral valve repair without ring annuloplasty, coronary artery bypass grafting using left internal mammary artery to the distal left anterior descending coronary artery, and clipping of the left atrial  appendage.  Intraoperative findings were notable for bulky vegetations on the aortic valve with significant leaflet destruction and severe aortic insufficiency but no annular abscess formation.  The mitral valve was functioning normally although there was clearly pre-existing sclerosis and thickening of the anterior leaflet of the mitral valve.  Mitral valve repair consisted only of debridement of the small vegetations adherent to the atrial surface of the anterior leaflet.  Ring annuloplasty was not performed because the patient did not have significant mitral regurgitation.  The patient's postoperative convalescence was notable for recurrent atrial flutter for which he underwent cardioversion.  He otherwise did quite well and routine follow-up echocardiogram performed 3 months postoperatively revealed normal left ventricular systolic function, normal functioning bioprosthetic tissue valve in the aortic position, mild mitral regurgitation, and mild mitral stenosis with mean transvalvular gradient across the mitral valve measured 5 mmHg.  Patient did very well from a cardiac standpoint until November 2019 when he began to develop progressive symptoms of exertional shortness of breath.  He was hospitalized acutely in February 2020 with chest pain and acute exacerbation of congestive heart failure in the setting of rapid atrial flutter.  He was cardioverted back into sinus rhythm but TEE revealed severe mitral regurgitation.  Catheterization revealed multivessel coronary artery disease with continued patency of left internal mammary artery to the distal left anterior descending coronary artery but further progression of severe stenosis involving posterior descending coronary artery arising off the left circumflex.  This was treated with PCI and stenting using a drug-eluting stent.  Patient again did well until May of this year when he presented with heart failure exacerbation in the setting of recurrent atrial  flutter with rapid ventricular response.  He again  underwent TEE and cardioversion.  TEE revealed severe mitral regurgitation with preserved left ventricular function.  The bioprosthetic tissue valve in the aortic position was functioning normally.  The patient was seen in follow-up recently by Dr. Sallyanne Kuster at which time he remained in sinus rhythm but he was complaining of worsening symptoms of congestive heart failure, New York Heart Association functional class IIIb.  Repeat echocardiogram confirmed the presence of persistent severe mitral regurgitation despite the fact the patient remained in sinus rhythm with normal left ventricular systolic function.  Left and right heart catheterization was performed September 13, 2019 and revealed multivessel coronary artery disease with chronic occlusion of left anterior descending coronary artery but continued patency of left internal mammary artery to the distal left anterior descending coronary artery.  There remain patent stents in the first obtuse marginal and posterior descending branches of the left circumflex coronary artery.  There was severe pulmonary hypertension with high filling pressures and large V waves on pulmonary capillary wedge tracing consistent with severe mitral regurgitation.  Cardiothoracic surgical consultation was requested.  Patient is married and lives locally in [place] with his wife.  He remains reasonably active physically and functionally independent, although he complains that he has been having increasing problems over the last several months.  He has severe exertional shortness of breath and occasionally gets short of breath at rest.  He has been having some trouble sleeping at night.  He asked variances occasional PND and orthopnea.  He has not had lower extremity edema.  He occasionally has fleeting episodes of sharp pain across his chest which are not related to physical exertion and typically only last a few seconds or minutes.  He  has not had dizzy spells or syncope.  He has some mild chronic problems with balance but this has been worse recently.  He has had a poor appetite and lost 25 pounds in weight over the six several months.  He denies any fevers or chills.  He has had problems with intermittent headaches which occasionally can be severe.  He otherwise denies any transient focal neurologic symptoms.  He has not been to a dentist in several years although he denies any loose teeth or painful teeth to suggest ongoing infection.  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   Enterococcal bacteremia 10/19/2014   Hepatitis    Memory difficulty    Migraine 09/12/2015   Mitral valve regurgitation, 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/2014   Paroxysmal atrial fibrillation ([place]) 10/19/2014   Paroxysmal atrial flutter ([place]) 10/19/2014   Prostate cancer ([place])    Protein-calorie malnutrition, severe (HCC)    Restless leg syndrome    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/2014   23 mm [facility] Ease bovine pericardial bioprosthetic tissue valve with bovine pericardial patch enlargement of aortic root   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   LIMA to LAD   S/P mitral valve repair 11/01/2014   Debridement of vegetation on anterior leaflet   Septic embolism ([place]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   Tricuspid regurgitation     Past Surgical History:  Procedure Laterality Date   AORTIC ROOT ENLARGEMENT N/A 11/01/2014   Procedure:  AORTIC ROOT ENLARGEMENT;  Surgeon: Rexene Alberts, MD;  Location: [place];  Service: Open Heart Surgery;  Laterality: N/A;   AORTIC VALVE  REPLACEMENT N/A 11/01/2014   Procedure: AORTIC VALVE REPLACEMENT (AVR);  Surgeon: Rexene Alberts, MD;  Location: [place];  Service: Open Heart Surgery;  Laterality: N/A;   CARDIAC CATHETERIZATION N/A 10/23/2014   Procedure: Right/Left Heart Cath and Coronary Angiography;  Surgeon: Lorretta Harp, MD;  Location: [place] CV LAB;  Service: Cardiovascular;  Laterality: N/A;   CARDIOVERSION N/A 12/18/2014   Procedure: CARDIOVERSION;  Surgeon: Pixie Casino, MD;  Location: [facility] ENDOSCOPY;  Service: Cardiovascular;  Laterality: N/A;   CARDIOVERSION N/A 02/06/2015   Procedure: CARDIOVERSION;  Surgeon: Larey Dresser, MD;  Location: [place];  Service: Cardiovascular;  Laterality: N/A;   CARDIOVERSION N/A 04/07/2018   Procedure: CARDIOVERSION;  Surgeon: Sanda Klein, MD;  Location: [place] ENDOSCOPY;  Service: Cardiovascular;  Laterality: N/A;   CARDIOVERSION N/A 07/28/2019   Procedure: CARDIOVERSION;  Surgeon: Lelon Perla, MD;  Location: [facility] ENDOSCOPY;  Service: Cardiovascular;  Laterality: N/A;   COLONOSCOPY N/A 10/25/2014   Procedure: COLONOSCOPY;  Surgeon: Arta Silence, MD;  Location: [facility] ENDOSCOPY;  Service: Endoscopy;  Laterality: N/A;   CORONARY ARTERY BYPASS GRAFT N/A 11/01/2014   Procedure: CORONARY ARTERY BYPASS GRAFTING (CABG) x 1 using internal mammary artery;  Surgeon: Rexene Alberts, MD;  Location: [place];  Service: Open Heart Surgery;  Laterality: N/A;   CORONARY CTO INTERVENTION N/A 04/12/2018   Procedure: CORONARY CTO INTERVENTION;  Surgeon: [name], MD;  Location: [place] [facility];  Service: Cardiovascular;  Laterality: N/A;   CORONARY STENT INTERVENTION N/A 04/12/2018   Procedure: CORONARY STENT INTERVENTION;  Surgeon: [name], MD;  Location: [place] [facility] LAB;  Service: Cardiovascular;  Laterality: N/A;   MITRAL VALVE REPAIR N/A 11/01/2014   Procedure: MITRAL VALVE REPAIR with no Ring;  Surgeon: Rexene Alberts, MD;  Location: [place];  Service: Open Heart  Surgery;  Laterality: N/A;   PROSTATECTOMY     RIGHT/LEFT HEART CATH AND CORONARY ANGIOGRAPHY N/A 09/13/2019   Procedure: RIGHT/LEFT HEART CATH AND CORONARY ANGIOGRAPHY;  Surgeon: Martinique, Peter M, MD;  Location: [place] CV LAB;  Service: Cardiovascular;  Laterality: N/A;   RIGHT/LEFT HEART CATH AND CORONARY/GRAFT ANGIOGRAPHY N/A 04/12/2018   Procedure: RIGHT/LEFT HEART CATH AND CORONARY/GRAFT ANGIOGRAPHY;  Surgeon: [name], MD;  Location: [place] CV LAB;  Service: Cardiovascular;  Laterality: N/A;   TEE WITHOUT CARDIOVERSION N/A 10/20/2014   Procedure: TRANSESOPHAGEAL ECHOCARDIOGRAM (TEE);  Surgeon: Josue Hector, MD;  Location: [place];  Service: Cardiovascular;  Laterality: N/A;   TEE WITHOUT CARDIOVERSION N/A 11/01/2014   Procedure: TRANSESOPHAGEAL ECHOCARDIOGRAM (TEE);  Surgeon: Rexene Alberts, MD;  Location: [place];  Service: Open Heart Surgery;  Laterality: N/A;   TEE WITHOUT CARDIOVERSION N/A 04/08/2018   Procedure: TRANSESOPHAGEAL ECHOCARDIOGRAM (TEE);  Surgeon: Jerline Pain, MD;  Location: [facility] ENDOSCOPY;  Service: Cardiovascular;  Laterality: N/A;   TEE WITHOUT CARDIOVERSION N/A 07/28/2019   Procedure: TRANSESOPHAGEAL ECHOCARDIOGRAM (TEE);  Surgeon: Lelon Perla, MD;  Location: [facility] ENDOSCOPY;  Service: Cardiovascular;  Laterality: N/A;    Family History  Problem Relation Age of Onset   Stroke Father     Social History   Socioeconomic History   Marital status: Married    Spouse name: Not on file   Number of children: 1   Years of education: Master's   Highest education level: Not on file  Occupational History   Occupation: Economist: [employer]  Tobacco Use   Smoking status: Former Smoker   Smokeless tobacco: Never Used   Tobacco comment: Smoked a pipe age 28->28  Substance and Sexual Activity   Alcohol use: No    Alcohol/week: 0.0 standard drinks    Comment: Drank from age 71->38   Drug use: No   Sexual activity: Not on  file  Other Topics Concern   Not on file  Social History Narrative   Lives at home w/ his wife   Right-handed   Caffeine: 2 cups daily   Social Determinants of Health   Financial Resource Strain:    Difficulty of Paying Living Expenses:   Food Insecurity:    Worried About Charity fundraiser in the Last Year:    Arboriculturist in the Last Year:   Transportation Needs:    Film/video editor (Medical):    Lack of Transportation (Non-Medical):   Physical Activity:    Days of Exercise per Week:    Minutes of Exercise per Session:   Stress:    Feeling of Stress :   Social Connections:    Frequency of Communication with Friends and Family:    Frequency of Social Gatherings with Friends and Family:    Attends Religious Services:    Active Member of Clubs or Organizations:    Attends Music therapist:    Marital Status:   Intimate Partner Violence:    Fear of Current or Ex-Partner:    Emotionally Abused:    Physically Abused:    Sexually Abused:     Current Outpatient Medications  Medication Sig Dispense Refill   acetaminophen (TYLENOL) 325 MG tablet Take 2 tablets (650 mg total) by mouth every 4 (four) hours as needed for headache or mild pain. (Patient taking differently: Take 325 mg by mouth every 4 (four) hours as needed for headache or mild pain. )     albuterol (VENTOLIN HFA) 108 (90 Base) MCG/ACT inhaler Inhale 2 puffs into the lungs every 6 (six) hours as needed for wheezing or shortness of breath.     atorvastatin (LIPITOR) 40 MG tablet Take 1 tablet (40 mg total) by mouth daily. 30 tablet 11   doxylamine, Sleep, (UNISOM) 25 MG tablet Take 12.5 mg by mouth at bedtime.     furosemide (LASIX) 20 MG tablet Take 2 tablets (40 mg total) by mouth daily. 180 tablet 3   gabapentin (NEURONTIN) 300 MG capsule Take 300 mg by mouth 2 (two) times daily. at dinner and at bedtime     metoprolol tartrate (LOPRESSOR) 25 MG tablet Take 0.5  tablets (12.5 mg total) by mouth 2 (two) times daily. 30 tablet 6   Multiple Vitamins-Minerals (PRESERVISION AREDS 2) CAPS Take 1 capsule by mouth every evening.      nitroGLYCERIN (NITROSTAT) 0.4 MG SL tablet Place 1 tablet (0.4 mg total) under the tongue every 5 (five) minutes x 3 doses as needed for chest pain. 25 tablet 4   Omega-3 Fatty Acids (OMEGA-3 PO) Take 1 capsule by mouth daily.     potassium chloride SA (KLOR-CON) 20 MEQ tablet Take 1 tablet (20 mEq total) by mouth daily. 90 tablet 3   Probiotic Product (PROBIOTIC PO) Take 1 capsule by mouth 2 (two) times daily.      rivaroxaban (XARELTO) 20 MG TABS tablet Take 1 tablet (20 mg total) by mouth daily. 30 tablet 6   Turmeric 500 MG  CAPS Take 500 mg by mouth every evening.     Current Facility-Administered Medications  Medication Dose Route Frequency Provider Last Rate Last Admin   sodium chloride flush (NS) 0.9 % injection 3 mL  3 mL Intravenous Q12H Croitoru, Mihai, MD        Allergies  Allergen Reactions   Pollen Extract Other (See Comments)    sneezing      Review of Systems:   General:  poor appetite, decreased energy, no weight gain, + weight loss, no fever  Cardiac:  no chest pain with exertion, occasional brief episodes atypical chest pain at rest, + SOB with low level exertion, occasional resting SOB, occasional PND, + orthopnea, no palpitations, + arrhythmia, + atrial fibrillation, no LE edema, no dizzy spells, no syncope  Respiratory:  + shortness of breath, no home oxygen, no productive cough, no dry cough, no bronchitis, no wheezing, no hemoptysis, no asthma, no pain with inspiration or cough, no sleep apnea, no CPAP at night  GI:   no difficulty swallowing, no reflux, no frequent heartburn, no hiatal hernia, no abdominal pain, no constipation, occasional diarrhea, no hematochezia, no hematemesis, no melena  GU:   no dysuria,  no frequency, no urinary tract infection, no hematuria, no enlarged prostate, no  kidney stones, no kidney disease  Vascular:  no pain suggestive of claudication, no pain in feet, no leg cramps, no varicose veins, no DVT, no non-healing foot ulcer  Neuro:   + remote h/o stroke, no TIA's, no seizures, + frequent headaches, no temporary blindness one eye,  no slurred speech, no peripheral neuropathy, no chronic pain, some instability of gait, some memory/cognitive dysfunction  Musculoskeletal: no arthritis, no joint swelling, no myalgias, very mild difficulty walking, normal mobility   Skin:   no rash, no itching, no skin infections, no pressure sores or ulcerations  Psych:   no anxiety, no depression, no nervousness, no unusual recent stress  Eyes:   no blurry vision, no floaters, no recent vision changes, + wears glasses or contacts  ENT:   no hearing loss, no loose or painful teeth, no dentures, last saw dentist many years ago  Hematologic:  no easy bruising, no abnormal bleeding, no clotting disorder, no frequent epistaxis  Endocrine:  no diabetes, does not check CBG's at home     Physical Exam:   BP (!) 131/86    Pulse 75    Resp 20 Comment: on RA   Ht 6' (1.829 m)    Wt 168 lb 12.8 oz (76.6 kg)    SpO2 92%    BMI 22.89 kg/m   General:  Thin,  well-appearing  HEENT:  Unremarkable   Neck:   no JVD, no bruits, no adenopathy   Chest:   clear to auscultation, symmetrical breath sounds, no wheezes, no rhonchi   CV:   RRR, grade III/VI holosystolic murmur   Abdomen:  soft, non-tender, no masses   Extremities:  warm, well-perfused, pulses palpable, no LE edema  Rectal/GU  Deferred  Neuro:   Grossly non-focal and symmetrical throughout  Skin:   Clean and dry, no rashes, no breakdown   Diagnostic Tests:   ECHOCARDIOGRAM REPORT       Patient Name:  Alan [name] Date of Exam: 07/25/2019  Medical Rec #: [id number]       Height:    72.0 in  Accession #:  [id number]       Weight:    176.1 lb  Date of Birth: October 22, 1950  BSA:      2.019 m  Patient Age:  69 years        BP:      136/97 mmHg  Patient Gender: M           HR:      114 bpm.  Exam Location: Inpatient   Procedure: 2D Echo, Cardiac Doppler and Color Doppler   Indications:  Chest pain    History:    Patient has prior history of Echocardiogram examinations,  most         recent 10/12/2018. CAD, Prior CABG, Aortic Valve Disease,  Mitral         Valve Disease and AVR, MV repai, Arrythmias:Atrial  Fibrillation         and Atrial Flutter; Risk Factors:Dyslipidemia. Pleural  effsuion.         Aortic Valve: 23 mm Edwards MagnaEase valve is present in  the         aortic position.         Mitral Valve: valve is present in the mitral position.    Sonographer:  Dustin Flock  Referring Phys: [id number] [place]    1. Left ventricular ejection fraction, by estimation, is 60 to 65%. The  left ventricle has normal function. The left ventricle has no regional  wall motion abnormalities. There is mild left ventricular hypertrophy.  Left ventricular diastolic function  could not be evaluated. Elevated left ventricular end-diastolic pressure.  2. Right ventricular systolic function is normal. The right ventricular  size is normal. There is moderately elevated pulmonary artery systolic  pressure. The estimated right ventricular systolic pressure is 71.0 mmHg.  3. Left atrial size was moderately dilated.  4. Right atrial size was mild to moderately dilated.  5. The mitral valve has been repaired/replaced. Mild to moderate mitral  valve regurgitation.  6. The tricuspid valve is abnormal. Tricuspid valve regurgitation is  moderate.  7. The aortic valve is tricuspid. Aortic valve regurgitation is not  visualized. There is a 23 mm Edwards MagnaEase valve present in the aortic  position.  8. The inferior vena cava is dilated in size  with <50% respiratory  variability, suggesting right atrial pressure of 15 mmHg.   FINDINGS  Left Ventricle: Left ventricular ejection fraction, by estimation, is 60  to 65%. The left ventricle has normal function. The left ventricle has no  regional wall motion abnormalities. The left ventricular internal cavity  size was normal in size. There is  mild left ventricular hypertrophy. Left ventricular diastolic function  could not be evaluated due to atrial fibrillation. Left ventricular  diastolic function could not be evaluated. Elevated left ventricular  end-diastolic pressure.   Right Ventricle: The right ventricular size is normal. No increase in  right ventricular wall thickness. Right ventricular systolic function is  normal. There is moderately elevated pulmonary artery systolic pressure.  The tricuspid regurgitant velocity is  3.27 m/s, and with an assumed right atrial pressure of 15 mmHg, the  estimated right ventricular systolic pressure is 62.6 mmHg.   Left Atrium: Left atrial size was moderately dilated.   Right Atrium: Right atrial size was mild to moderately dilated.   Pericardium: There is no evidence of pericardial effusion.   Mitral Valve: The mitral valve has been repaired/replaced. There is mild  thickening of the mitral valve leaflet(s). Mild to moderate mitral valve  regurgitation. There is a present in the mitral position. MV peak  gradient, 18.3 mmHg. The mean  mitral valve  gradient is 7.0 mmHg.   Tricuspid Valve: The tricuspid valve is abnormal. Tricuspid valve  regurgitation is moderate.   Aortic Valve: The aortic valve is tricuspid. Aortic valve regurgitation is  not visualized. There is a 23 mm Edwards MagnaEase valve present in the  aortic position.   Pulmonic Valve: The pulmonic valve was grossly normal. Pulmonic valve  regurgitation is trivial.   Aorta: The aortic root and ascending aorta are structurally normal, with  no evidence of  dilitation.   Venous: The inferior vena cava is dilated in size with less than 50%  respiratory variability, suggesting right atrial pressure of 15 mmHg.   IAS/Shunts: No atrial level shunt detected by color flow Doppler.   Additional Comments: There is a small pleural effusion in the left lateral  region.     LEFT VENTRICLE  PLAX 2D  LVIDd:     4.61 cm Diastology  LVIDs:     2.73 cm LV e' lateral:  11.90 cm/s  LV PW:     1.19 cm LV E/e' lateral: 14.7  LV IVS:    1.03 cm LV e' medial:  7.83 cm/s  LVOT diam:   2.00 cm LV E/e' medial: 22.3  LV SV:     33  LV SV Index:  16  LVOT Area:   3.14 cm     RIGHT VENTRICLE  RV Basal diam: 3.99 cm  RV S prime:   5.66 cm/s  TAPSE (M-mode): 2.2 cm   LEFT ATRIUM       Index    RIGHT ATRIUM      Index  LA diam:    4.80 cm 2.38 cm/m RA Area:   25.80 cm  LA Vol (A2C):  90.8 ml 44.98 ml/m RA Volume:  75.70 ml 37.50 ml/m  LA Vol (A4C):  83.6 ml 41.41 ml/m  LA Biplane Vol: 92.2 ml 45.67 ml/m  AORTIC VALVE  LVOT Vmax:  82.60 cm/s  LVOT Vmean: 49.100 cm/s  LVOT VTI:  0.104 m    AORTA  Ao Root diam: 2.30 cm   MITRAL VALVE        TRICUSPID VALVE  MV Area (PHT): 4.71 cm   TR Peak grad:  42.8 mmHg  MV Peak grad: 18.3 mmHg  TR Vmax:    327.00 cm/s  MV Mean grad: 7.0 mmHg  MV Vmax:    2.14 m/s   SHUNTS  MV Vmean:   117.0 cm/s  Systemic VTI: 0.10 m  MV Decel Time: 161 msec   Systemic Diam: 2.00 cm  MV E velocity: 175.00 cm/s   Lyman Bishop MD  Electronically signed by Lyman Bishop MD  Signature Date/Time: 07/25/2019/2:50:10 PM        TRANSESOPHOGEAL ECHO REPORT       Patient Name:  Alan [name] Date of Exam: 07/28/2019  Medical Rec #: [id number]       Height:    72.0 in  Accession #:  [id number]       Weight:    169.5 lb  Date of Birth: 1950-11-22       BSA:     1.986 m    Patient Age:  15 years        BP:      106/71 mmHg  Patient Gender: M           HR:      113 bpm.  Exam Location: Inpatient   Procedure: Transesophageal Echo, Color Doppler and Cardiac Doppler  Indications:   Atrial Fibrillation    History:     Patient has prior history of Echocardiogram examinations,  most          recent 07/25/2019. CAD, Prior CABG, Mitral Valve Disease;          Arrythmias:Atrial Fibrillation.          Aortic Valve: 23 mm Edwards bioprosthetic valve is  present in          the aortic position. Procedure Date: 11/01/2014.          Mitral Valve: MV Repair with no ring valve is present in  the          mitral position. Procedure Date: 11/01/2014.    Sonographer:   Mikki Santee RDCS (AE)  Referring Phys: [place]  Diagnosing Phys: [name] MD   PROCEDURE: After discussion of the risks and benefits of a TEE, an  informed consent was obtained. The transesophogeal probe was passed  without difficulty through the esophogus of the patient. Sedation  performed by different physician. The patient was  monitored while under deep sedation. Anesthestetic sedation was provided  intravenously by Anesthesiology: 155.35mg  of Propofol. The patient  developed no complications during the procedure.   IMPRESSIONS    1. Normal LV function; s/p AVR with normal gradients and no AI; MV  leaflets do not coapt; severe MR; severe biatrial enlargement; LAA  previously clipped; no LA thrombus; mild RVE; mild RV dysfunction.  2. Left ventricular ejection fraction, by estimation, is 55 to 60%. The  left ventricle has normal function. The left ventricle has no regional  wall motion abnormalities.  3. Right ventricular systolic function is mildly reduced. The right  ventricular size is mildly enlarged.  4. Left atrial size was severely dilated. No left atrial/left atrial   appendage thrombus was detected.  5. Right atrial size was severely dilated.  6. Moderate pleural effusion.  7. The mitral valve is abnormal. Severe mitral valve regurgitation. There  is a MV Repair with no ring present in the mitral position. Procedure  Date: 11/01/2014.  8. Tricuspid valve regurgitation is moderate to severe.  9. The aortic valve has been repaired/replaced. Aortic valve  regurgitation is not visualized. There is a 23 mm Edwards bioprosthetic  valve present in the aortic position. Procedure Date: 11/01/2014.  10. There is mild (Grade II) plaque involving the descending aorta.   FINDINGS  Left Ventricle: Left ventricular ejection fraction, by estimation, is 55  to 60%. The left ventricle has normal function. The left ventricle has no  regional wall motion abnormalities. The left ventricular internal cavity  size was normal in size. There is  no left ventricular hypertrophy.   Right Ventricle: The right ventricular size is mildly enlarged. Right  vetricular wall thickness was not assessed. Right ventricular systolic  function is mildly reduced.   Left Atrium: Left atrial size was severely dilated. No left atrial/left  atrial appendage thrombus was detected.   Right Atrium: Right atrial size was severely dilated.   Pericardium: There is no evidence of pericardial effusion.   Mitral Valve: The mitral valve is abnormal. Severe mitral valve  regurgitation. There is a MV Repair with no ring present in the mitral  position. Procedure Date: 11/01/2014.   Tricuspid Valve: The tricuspid valve is normal in structure. Tricuspid  valve regurgitation is moderate to severe.   Aortic Valve: The aortic valve has been repaired/replaced. Aortic valve  regurgitation is not visualized. There is a 23  mm Edwards bioprosthetic  valve present in the aortic position. Procedure Date: 11/01/2014.   Pulmonic Valve: The pulmonic valve was not well visualized. Pulmonic valve   regurgitation is not visualized.   Aorta: The aortic root is normal in size and structure. There is mild  (Grade II) plaque involving the descending aorta.   IAS/Shunts: No atrial level shunt detected by color flow Doppler.   Additional Comments: Normal LV function; s/p AVR with normal gradients and  no AI; MV leaflets do not coapt; severe MR; severe biatrial enlargement;  LAA previously clipped; no LA thrombus; mild RVE; mild RV dysfunction.  There is a moderate pleural  effusion.     MR Peak grad:  59.9 mmHg  TRICUSPID VALVE  MR Mean grad:  40.0 mmHg  TR Peak grad:  37.5 mmHg  MR Vmax:     387.00 cm/s TR Vmax:    306.00 cm/s  MR Vmean:    306.0 cm/s  MR PISA:     5.09 cm  MR PISA Eff ROA: 41 mm  MR PISA Radius: 0.90 cm   [name] MD  Electronically signed by [name] MD  Signature Date/Time: 07/28/2019/12:05:21 PM      ECHOCARDIOGRAM LIMITED REPORT       Patient Name:  Alan [name] Date of Exam: 08/22/2019  Medical Rec #: [id number]       Height:    72.0 in  Accession #:  [id number]       Weight:    173.4 lb  Date of Birth: 03/06/1950       BSA:     2.005 m  Patient Age:  40 years        BP:      135/88 mmHg  Patient Gender: M           HR:      74 bpm.  Exam Location: [address]   Procedure: Limited Echo, Limited Color Doppler and Cardiac Doppler                MODIFIED REPORT:  This report was modified by Eleonore Chiquito MD on 08/22/2019 due to rvsp.  Indications:   I05.9 Mitral valve disorder    History:     Patient has prior history of Echocardiogram examinations,  most          recent 07/25/2019. Prior CABG; Arrythmias:Atrial  Fibrillation          and Atrial Flutter.          Aortic Valve: 23 mm [facility] Ease bovine pericardial          bioprosthetic tissue valve valve is present  in the aortic          position. Procedure Date: 11/01/14.          Mitral Valve: valve is present in the mitral position.          Procedure Date: 11/01/2014.    Sonographer:   Marygrace Drought RCS  Referring Phys: Jenkins  Diagnosing Phys: Eleonore Chiquito MD   IMPRESSIONS    1. Left ventricular ejection fraction, by estimation, is 55 to 60%. The  left ventricle has normal function. Left ventricular diastolic function  could not be evaluated. There is abnormal (paradoxical) septal motion,  consistent with right ventricular  volume overload.  2. Right ventricular systolic function is mildly reduced. The right  ventricular size is severely enlarged. There is severely elevated  pulmonary artery systolic pressure. The estimated right ventricular  systolic pressure is 16.1 mmHg.  3. Left atrial size was moderately dilated.  4. Right atrial size was severely dilated.  5. History of MV repair (debridement of vegetation and no annuloplasty  ring per report). The MV leaflets appear thickened with restricted leaflet  motion in systole/diastole (IIIA). There is incomplete leaflet coaptation  with severe, centrally directed  MR. Suspect mixed etiology, primary leaflet problem (IIIA) with possible  atrial functional mechanism (I) due to annular dilation. Both leaflets  appear restricted and not limited to the PMVL. Possibly a more focused TEE  would better characterize pathology  and repairability. Severe mitral valve regurgitation. There is a present  in the mitral position. Procedure Date: 11/01/2014.  6. The tricuspid valve is abnormal. Tricuspid valve regurgitation is  severe.  7. 23 mm magna ease in AoV position. Vmax 1.21 m/s, MG 3 mmHG, EOA 1.87  cm2, DI 0.66. The aortic valve has been repaired/replaced. Aortic valve  regurgitation is not visualized. There is a 23 mm [facility] Ease  bovine pericardial bioprosthetic tissue  valve  valve present in the aortic position. Procedure Date: 11/01/14. Echo  findings are consistent with normal structure and function of the aortic  valve prosthesis.  8. The inferior vena cava is dilated in size with <50% respiratory  variability, suggesting right atrial pressure of 15 mmHg.   Comparison(s): No significant change from prior study.   FINDINGS  Left Ventricle: Left ventricular ejection fraction, by estimation, is 55  to 60%. The left ventricle has normal function. The left ventricular  internal cavity size was normal in size. There is no left ventricular  hypertrophy. Abnormal (paradoxical)  septal motion, consistent with right ventricular volume overload. Left  ventricular diastolic function could not be evaluated due to mitral  regurgitation (moderate or greater).   Right Ventricle: The right ventricular size is severely enlarged. No  increase in right ventricular wall thickness. Right ventricular systolic  function is mildly reduced. There is severely elevated pulmonary artery  systolic pressure. The tricuspid  regurgitant velocity is 3.79 m/s, and with an assumed right atrial  pressure of 15 mmHg, the estimated right ventricular systolic pressure is  09.6 mmHg.   Left Atrium: Left atrial size was moderately dilated.   Right Atrium: Right atrial size was severely dilated.   Pericardium: Trivial pericardial effusion is present.   Mitral Valve: History of MV repair (debridement of vegetation and no  annuloplasty ring per report). The MV leaflets appear thickened with  restricted leaflet motion in systole/diastole (IIIA). There is incomplete  leaflet coaptation with severe, centrally  directed MR. Suspect mixed etiology, primary leaflet problem (IIIA) with  possible atrial functional mechanism (I) due to annular dilation. Both  leaflets appear restricted and not limited to the PMVL. Possibly a more  focused TEE would better characterize  pathology and  repairability. Severe mitral valve regurgitation. There is a  present in the mitral position. Procedure Date: 11/01/2014. MV peak  gradient, 22.8 mmHg. The mean mitral valve gradient is 9.0 mmHg.   Tricuspid Valve: The tricuspid valve is abnormal. Tricuspid valve  regurgitation is severe. No evidence of tricuspid stenosis. The flow in  the hepatic veins is reversed during ventricular systole.   Aortic Valve: 23 mm magna ease in AoV position. Vmax 1.21 m/s, MG 3 mmHG,  EOA 1.87 cm2, DI 0.66. The aortic valve has been repaired/replaced. Aortic  valve regurgitation is not visualized. Aortic valve mean gradient measures  3.0 mmHg. Aortic valve peak  gradient measures 5.9  mmHg. Aortic valve area, by VTI measures 1.87 cm.  There is a 23 mm [facility] Ease bovine pericardial bioprosthetic  tissue valve valve present in the aortic position. Procedure Date:  11/01/14. Echo findings are consistent with  normal structure and function of the aortic valve prosthesis.   Pulmonic Valve: The pulmonic valve was grossly normal. Pulmonic valve  regurgitation is mild. No evidence of pulmonic stenosis.   Aorta: The aortic root and ascending aorta are structurally normal, with  no evidence of dilitation.   Venous: The inferior vena cava is dilated in size with less than 50%  respiratory variability, suggesting right atrial pressure of 15 mmHg.     LEFT VENTRICLE  PLAX 2D  LVIDd:     4.60 cm Diastology  LVIDs:     2.50 cm LV e' lateral:  8.92 cm/s  LV PW:     1.10 cm LV E/e' lateral: 17.9  LV IVS:    1.10 cm LV e' medial:  6.96 cm/s  LVOT diam:   1.90 cm LV E/e' medial: 23.0  LV SV:     37  LV SV Index:  19  LVOT Area:   2.84 cm     RIGHT VENTRICLE  RV Basal diam: 4.40 cm  RV S prime:   8.49 cm/s  TAPSE (M-mode): 1.4 cm  RVSP:      65.5 mmHg   LEFT ATRIUM       Index    RIGHT ATRIUM      Index  LA diam:    4.70 cm 2.34 cm/m  RA Pressure: 8.00 mmHg  LA Vol (A2C):  70.1 ml 34.96 ml/m RA Area:   26.00 cm  LA Vol (A4C):  92.5 ml 46.13 ml/m RA Volume:  88.90 ml 44.34 ml/m  LA Biplane Vol: 83.0 ml 41.39 ml/m  AORTIC VALVE  AV Area (Vmax):  1.92 cm  AV Area (Vmean):  2.12 cm  AV Area (VTI):   1.87 cm  AV Vmax:      121.00 cm/s  AV Vmean:     81.200 cm/s  AV VTI:      0.200 m  AV Peak Grad:   5.9 mmHg  AV Mean Grad:   3.0 mmHg  LVOT Vmax:     82.00 cm/s  LVOT Vmean:    60.800 cm/s  LVOT VTI:     0.132 m  LVOT/AV VTI ratio: 0.66    AORTA  Ao Root diam: 3.20 cm  Ao Asc diam: 3.60 cm   MITRAL VALVE         TRICUSPID VALVE  MV Area (PHT): 3.43 cm   TR Peak grad:  57.5 mmHg  MV Peak grad: 22.8 mmHg   TR Vmax:    379.00 cm/s  MV Mean grad: 9.0 mmHg   Estimated RAP: 8.00 mmHg  MV Vmax:    2.39 m/s   RVSP:      65.5 mmHg  MV Vmean:   127.0 cm/s  MV Decel Time: 221 msec   SHUNTS  MR Peak grad:  71.2 mmHg  Systemic VTI: 0.13 m  MR Mean grad:  46.0 mmHg  Systemic Diam: 1.90 cm  MR Vmax:     422.00 cm/s  MR Vmean:    314.0 cm/s  MR PISA:     4.02 cm  MR PISA Eff ROA: 29 mm  MR PISA Radius: 0.80 cm  MV E velocity: 160.00 cm/s  MV A velocity: 46.50 cm/s  MV E/A ratio: 3.44  Eleonore Chiquito MD  Electronically signed by Eleonore Chiquito MD  Signature Date/Time: 08/22/2019/5:33:19 PM      RIGHT/LEFT HEART CATH AND CORONARY ANGIOGRAPHY  Conclusion    Ost LAD lesion is 60% stenosed.  Prox LAD to Mid LAD lesion is 100% stenosed.  Previously placed Ost LPDA to LPDA drug eluting stent is widely patent.  Balloon angioplasty was performed.  Balloon angioplasty was performed.  Prox RCA to Mid RCA lesion is 80% stenosed.  Mid LAD lesion is 50% stenosed.  Ost 1st Mrg to 1st Mrg lesion is 40% stenosed.  Hemodynamic findings consistent with severe pulmonary hypertension.   1. 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 Stents in the first OM and left PDA are patent 2. Patent LIMA to the LAD 3. Severe pulmonary HTN with mean 46 mm Hg 4. High LV filling pressures with prominent V wave on PCWP tracing c/w severe MR.   Plan: CT surgery evaluation for MV repair/replacement.   Recommendations  Antiplatelet/Anticoag Recommend to resume Rivaroxaban, at currently prescribed dose and frequency on 09/14/2019. Concurrent antiplatelet therapy not recommended.  Indications  Mitral valve insufficiency, unspecified etiology [I34.0 (ICD-10-CM)]  Procedural Details  Technical Details Indication: 69 yo WM s/p AVR and MV repair and LIMA to the LAD. Subsequent stenting of first OM and left PDA in Feb 2020. Now presents with severe MR.  Procedural Details: The left wrist was prepped, draped, and anesthetized with 1% lidocaine. Using the modified Seldinger technique a 6 Fr slender sheath was placed in the left radial artery and a 7 French sheath was placed in the right brachial vein. A Swan-Ganz catheter was used for the right heart catheterization. Standard protocol was followed for recording of right heart pressures and sampling of oxygen saturations. Fick cardiac output was calculated. Standard Judkins catheters were used for selective coronary and graft angiography. The RCA was engaged with a LA 0.75 catheter.  There were no immediate procedural complications. The patient was transferred to the post catheterization recovery area for further monitoring.  Contrast: 70 cc  Estimated blood loss <50 mL.   During this procedure medications were administered to achieve and maintain moderate conscious sedation while the patient's heart rate, blood pressure, and oxygen saturation were continuously monitored and I was present face-to-face 100% of this time.  Medications (Filter: Administrations occurring from 1111 to 1221 on 09/13/19) Heparin (Porcine) in NaCl 1000-0.9  UT/500ML-% SOLN (mL) Total volume:  1,000 mL Date/Time  Rate/Dose/Volume Action  09/13/19 1121  500 mL Given  1121  500 mL Given    fentaNYL (SUBLIMAZE) injection (mcg) Total dose:  25 mcg Date/Time  Rate/Dose/Volume Action  09/13/19 1132  25 mcg Given    midazolam (VERSED) injection (mg) Total dose:  1 mg Date/Time  Rate/Dose/Volume Action  09/13/19 1132  1 mg Given    lidocaine (PF) (XYLOCAINE) 1 % injection (mL) Total volume:  5 mL Date/Time  Rate/Dose/Volume Action  09/13/19 1150  5 mL Given    heparin sodium (porcine) injection (Units) Total dose:  4,000 Units Date/Time  Rate/Dose/Volume Action  09/13/19 1153  4,000 Units Given    Radial Cocktail/Verapamil only (mL) Total volume:  10 mL Date/Time  Rate/Dose/Volume Action  09/13/19 1150  10 mL Given    iohexol (OMNIPAQUE) 350 MG/ML injection (mL) Total volume:  70 mL Date/Time  Rate/Dose/Volume Action  09/13/19 1218  70 mL Given    Sedation Time  Sedation Time Physician-1: 40 minutes 49 seconds  Contrast  Medication  Name Total Dose  iohexol (OMNIPAQUE) 350 MG/ML injection 70 mL    Radiation/Fluoro  Fluoro time: 9.2 (min) DAP: 52778 (mGycm2) Cumulative Air Kerma: 242 (mGy)  Complications  Complications documented before study signed (09/13/2019 35:36 PM)   No complications were associated with this study.  Documented by Martinique, Peter M, MD - 09/13/2019 12:23 PM    Coronary Findings  Diagnostic Dominance: Left Left Anterior Descending  Ost LAD lesion is 60% stenosed.  Prox LAD to Mid LAD lesion is 100% stenosed.  Mid LAD lesion is 50% stenosed.  Left Circumflex  First Obtuse Marginal Branch  Ost 1st Mrg to 1st Mrg lesion is 40% stenosed. The lesion was previously treated. Chronic total occlusion, vessel collateralized from the LAD as it fills from the LIMA graft.  Second Obtuse Marginal Branch  Left Posterior Descending Artery  Previously placed Ost LPDA to LPDA drug eluting stent is widely patent.   Right Coronary Artery  Vessel is small. Nondominant  Prox RCA to Mid RCA lesion is 80% stenosed.  LIMA Graft To Mid LAD  Intervention  No interventions have been documented. Right Heart  Right Heart Pressures Hemodynamic findings consistent with severe pulmonary hypertension. Elevated LV filling pressures with large V wave on PCWP tracing.  Coronary Diagrams  Diagnostic Dominance: Left  Intervention  Implants   No implant documentation for this case.  Syngo Images  Show images for CARDIAC CATHETERIZATION Images on Long Term Storage  Show images for Mirabile, CHEZ BULNES to Procedure Log  Procedure Log    Hemo Data   Most Recent Value  Fick Cardiac Output 4.81 L/min  Fick Cardiac Output Index 2.46 (L/min)/BSA  RA A Wave 16 mmHg  RA V Wave 18 mmHg  RA Mean 15 mmHg  RV Systolic Pressure 71 mmHg  RV Diastolic Pressure 10 mmHg  RV EDP 17 mmHg  PA Systolic Pressure 70 mmHg  PA Diastolic Pressure 30 mmHg  PA Mean 46 mmHg  PW A Wave 30 mmHg  PW V Wave 49 mmHg  PW Mean 32 mmHg  AO Systolic Pressure 144 mmHg  AO Diastolic Pressure 62 mmHg  AO Mean 78 mmHg  QP/QS 1  TPVR Index 18.69 HRUI  TSVR Index 31.69 HRUI  PVR SVR Ratio 0.22  TPVR/TSVR Ratio 0.59     EKG: NSR w/out significant AV conduction delay (09/13/2019)    Impression:  Patient has severe symptomatic primary mitral regurgitation with recurrent atrial fibrillation and atrial flutter.  He presents with progressive symptoms of exertional shortness of breath and fatigue with occasional resting shortness of breath, orthopnea, and PND consistent with chronic diastolic congestive heart failure, New York Heart Association functional class IIIb.  Symptoms seem to be gradually getting worse despite the fact the patient has been maintaining sinus rhythm since recent DC cardioversion.  I have personally reviewed the patient's multiple transthoracic and transesophageal echocardiograms as well as his recent  diagnostic cardiac catheterization.  The patient has normal left ventricular systolic function with ejection fraction estimated 55 to 60% at the time of most recent follow-up echocardiogram.  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  There is severe mitral regurgitation.  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  It should be noted that the anterior leaflet of the mitral valve was notably thickened and somewhat fibrotic at the time of his surgery in 2016.  This  may be due to rheumatic disease, but it has clearly gotten worse.  Under the circumstances it is not likely that his mitral valve will be repairable.  However, there is now no question that the mitral regurgitation is related to primary valvular disease and without surgical intervention is long-term prognosis would be poor.  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  Moreover, the patient has worsening congestive heart failure with recent development of poor appetite, significant weight loss, and worsening generalized weakness.   Plan:  The patient and his family were counseled at length regarding the indications, risks and potential benefits of mitral valve replacement.  The rationale for elective surgery has been explained, including a comparison between surgery and continued medical therapy with close follow-up.  The likelihood of successful and durable mitral valve repair has been discussed with particular reference to the findings of their recent echocardiogram.  Based upon these findings and previous experience, I have quoted them a less than 25 percent likelihood of successful valve repair.   Alternative surgical approaches have been discussed including a comparison between conventional sternotomy and minimally-invasive techniques.  The relative risks and benefits of each have been reviewed as they pertain to the patient's specific circumstances, and expectations for the patient's postoperative convalescence has been discussed.  The relative risks and benefits of performing a maze procedure at the time of his surgery was discussed at length, including the expected likelihood of long term freedom from recurrent symptomatic atrial fibrillation and/or atrial flutter.  The presence of secondary tricuspid regurgitation was discussed including the possibility that concomitant tricuspid valve repair might be necessary.  Assuming that the patient's valve cannot be successfully repaired, we discussed the possibility of replacing the mitral valve using a mechanical prosthesis with the attendant need for long-term anticoagulation versus the alternative of replacing it using a bioprosthetic tissue valve with its potential for late structural valve deterioration and failure, depending upon the patient's longevity.  The patient specifically requests that if the mitral valve must be replaced that it be done using a bioprosthetic valve.   The patient desires to proceed with surgery as soon as practical.    We tentatively plan to proceed with mitral valve replacement with possible concomitant Maze procedure and/or tricuspid valve repair via right minithoracotomy approach on October 05, 2019.  The patient will return to our office for follow-up prior to surgery on October 03, 2019.  Prior to that the patient will undergo MRI of the head due to the presence of significant recent headaches and transient neurologic symptoms.  The patient will also undergo CT angiography to evaluate the feasibility of peripheral cannulation for surgery.  Formal physical therapy evaluation will be requested.  Finally, the patient will be sent  for dental service consultation for routine dental examination and cleaning.  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   I spent in excess of 90 minutes during the conduct of this office consultation and >50% of this time involved direct face-to-face encounter with the patient for counseling and/or coordination of their care.    [name]. Roxy Manns, MD 09/21/2019 9:52 AM

## 2019-09-22 ENCOUNTER — Encounter (HOSPITAL_COMMUNITY): Payer: Self-pay | Admitting: Dentistry

## 2019-09-22 ENCOUNTER — Encounter: Payer: Self-pay | Admitting: *Deleted

## 2019-09-22 ENCOUNTER — Ambulatory Visit (HOSPITAL_COMMUNITY): Payer: Self-pay | Admitting: Dentistry

## 2019-09-22 VITALS — BP 112/74 | HR 70 | Temp 98.3°F

## 2019-09-22 DIAGNOSIS — K0530 Chronic periodontitis, unspecified: Secondary | ICD-10-CM

## 2019-09-22 DIAGNOSIS — M2630 Unspecified anomaly of tooth position of fully erupted tooth or teeth: Secondary | ICD-10-CM

## 2019-09-22 DIAGNOSIS — K08409 Partial loss of teeth, unspecified cause, unspecified class: Secondary | ICD-10-CM

## 2019-09-22 DIAGNOSIS — I340 Nonrheumatic mitral (valve) insufficiency: Secondary | ICD-10-CM

## 2019-09-22 DIAGNOSIS — K06010 Localized gingival recession, unspecified: Secondary | ICD-10-CM

## 2019-09-22 DIAGNOSIS — Z9189 Other specified personal risk factors, not elsewhere classified: Secondary | ICD-10-CM

## 2019-09-22 DIAGNOSIS — M264 Malocclusion, unspecified: Secondary | ICD-10-CM

## 2019-09-22 DIAGNOSIS — Z01818 Encounter for other preprocedural examination: Secondary | ICD-10-CM

## 2019-09-22 DIAGNOSIS — K141 Geographic tongue: Secondary | ICD-10-CM

## 2019-09-22 DIAGNOSIS — K029 Dental caries, unspecified: Secondary | ICD-10-CM

## 2019-09-22 DIAGNOSIS — K036 Deposits [accretions] on teeth: Secondary | ICD-10-CM

## 2019-09-22 DIAGNOSIS — M270 Developmental disorders of jaws: Secondary | ICD-10-CM

## 2019-09-22 DIAGNOSIS — K0850 Unsatisfactory restoration of tooth, unspecified: Secondary | ICD-10-CM

## 2019-09-22 MED ORDER — SODIUM FLUORIDE 1.1 % DT CREA
TOPICAL_CREAM | DENTAL | 99 refills | Status: DC
Start: 2019-09-22 — End: 2019-10-27

## 2019-09-22 MED ORDER — AMOXICILLIN 500 MG PO CAPS
ORAL_CAPSULE | ORAL | 2 refills | Status: AC
Start: 2019-09-22 — End: ?

## 2019-09-22 NOTE — Progress Notes (Signed)
DENTAL CONSULTATION  Date of Consultation:  09/22/2019 Patient Name:   Alan [name] Date of Birth:   22-Jan-1951 Medical Record Number: [id number]  COVID 19 SCREENING: The patient does not symptoms concerning for COVID-19 infection (Including fever, chills, cough, or new SHORTNESS OF BREATH).  Patient has had both doses of the Moderna Covid vaccine.  VITALS: BP 112/74 (BP Location: Right Arm)   Pulse 70   Temp 98.3 F (36.8 C)   CHIEF COMPLAINT: Patient referred by Dr. Roxy Manns for a dental consultation.  HPI: Alan [name] is a 69 year old male recently diagnosed with severe mitral regurgitation.  Patient with anticipated mitral valve repair or replacement heart surgery with Dr.  Roxy Manns.  The patient is now seen as part of the medically necessary preheart valve surgery dental protocol examination.  The patient currently denies acute toothaches, swellings, or abscesses.  Patient cannot remember the last time he was seen by his primary dentist.  Patient was seen on 10/24/2014 by Dental Medicine prior to his anticipated aortic valve surgery.  The patient apparently has been seen by dentist since that time for multiple extractions and implant placement but the patient cannot remember the name of that dentist.  Patient indicates that he does not seek regular dental care in spite of recommendations for regular dental care in 2016.  The patient is aware of the need to use antibiotic premedication prior to invasive dental procedures per American Heart Association guidelines due to his previous aortic valve replacement.  The patient denies having partial dentures.  The patient denies having dental phobia.   PROBLEM LIST: Patient Active Problem List   Diagnosis Date Noted  . Severe mitral regurgitation     Priority: High  . Tricuspid regurgitation   . Chest pain 07/25/2019  . Dyspnea 05/10/2019  . Acute diastolic CHF (congestive heart failure) ([place]) 04/13/2018  . Status post ligation  of left atrial appendage 03/20/2018  . H/O mitral valve repair 03/20/2018  . History of aortic valve replacement with bioprosthetic valve 03/20/2018  . Recurrent major depressive disorder ([place]) 03/20/2018  . Coronary artery disease involving native coronary artery of native heart without angina pectoris   . Memory difficulty 02/06/2017  . Migraine 09/12/2015  . Vertigo 09/12/2015  . Atrial flutter ([place])   . Typical atrial flutter ([place])   . Long term current use of anticoagulant therapy 11/17/2014  . 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/2014  . 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  . S/P mitral valve repair 11/01/2014  . 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 History of endocarditis 2016   . Protein-calorie malnutrition, severe ([place])   . CAD S/P percutaneous coronary angioplasty 10/23/2014  . Septic embolism ([place])   . Leukocytosis   . Troponin level elevated   . Atherosclerotic peripheral vascular disease ([employer]) 10/20/2014  . Aortic valve insufficiency, severe 10/20/2014  . History of stroke 10/20/2014  . Enterococcal bacteremia 10/19/2014  . 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 Paroxysmal atrial fibrillation ([place]) 10/19/2014  . Atrial flutter with rapid ventricular response ([place]) s/p TEE/DCCV 07/28/19 successful 10/19/2014  . Acute encephalopathy 10/18/2014  . Rash 10/18/2014  . Normocytic anemia 10/18/2014  . Dizziness 10/18/2014  . Prostate cancer ([place]) 07/23/2011  . HTN (hypertension) 08/16/2008  . Headache 08/16/2008  . Depression with anxiety 06/29/2008  . Restless legs syndrome (RLS) 06/21/2007  . Allergic rhinitis 06/21/2007  . Attention deficit disorder 06/21/2007    PMH: Past  Medical History:  Diagnosis Date  . 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 Atrial flutter with rapid ventricular response ([place]) s/p  TEE/DCCV 07/28/19 successful 10/19/2014  . Coronary artery disease   . 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 Enterococcal bacteremia 10/19/2014  . Hepatitis   . Memory difficulty   . Migraine 09/12/2015  . 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 Paroxysmal atrial fibrillation ([place]) 10/19/2014  . Paroxysmal atrial flutter ([place]) 10/19/2014  . Prostate cancer ([place])   . Protein-calorie malnutrition, severe ([place])   . Restless leg syndrome   . 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/2014   23 mm [place] Ease bovine pericardial bioprosthetic tissue valve with bovine pericardial patch enlargement of aortic root  . 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 S/P mitral valve repair 11/01/2014   Debridement of vegetation on anterior leaflet  . 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 Severe mitral regurgitation   . 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 Tricuspid regurgitation     PSH: Past Surgical History:  Procedure Laterality Date  . AORTIC ROOT ENLARGEMENT N/A 11/01/2014   Procedure: AORTIC ROOT ENLARGEMENT;  Surgeon: Rexene Alberts, MD;  Location: [facility];  Service: Open Heart Surgery;  Laterality: N/A;  . AORTIC VALVE REPLACEMENT N/A 11/01/2014   Procedure: AORTIC VALVE REPLACEMENT (AVR);  Surgeon: Rexene Alberts, MD;  Location: [facility];  Service: Open Heart Surgery;  Laterality: N/A;  . CARDIAC CATHETERIZATION N/A 10/23/2014   Procedure: Right/Left Heart Cath and Coronary Angiography;  Surgeon: Lorretta Harp, MD;  Location: [facility] CV LAB;  Service: Cardiovascular;  Laterality: N/A;  . CARDIOVERSION N/A 12/18/2014   Procedure: CARDIOVERSION;  Surgeon: Pixie Casino, MD;  Location: [facility] ENDOSCOPY;  Service: Cardiovascular;  Laterality: N/A;  . CARDIOVERSION N/A 02/06/2015   Procedure: CARDIOVERSION;  Surgeon: Larey Dresser, MD;  Location: [facility];  Service: Cardiovascular;  Laterality: N/A;  . CARDIOVERSION N/A  04/07/2018   Procedure: CARDIOVERSION;  Surgeon: Sanda Klein, MD;  Location: [facility] ENDOSCOPY;  Service: Cardiovascular;  Laterality: N/A;  . CARDIOVERSION N/A 07/28/2019   Procedure: CARDIOVERSION;  Surgeon: Lelon Perla, MD;  Location: [facility] ENDOSCOPY;  Service: Cardiovascular;  Laterality: N/A;  . COLONOSCOPY N/A 10/25/2014   Procedure: COLONOSCOPY;  Surgeon: Arta Silence, MD;  Location: [facility] ENDOSCOPY;  Service: Endoscopy;  Laterality: N/A;  . CORONARY ARTERY BYPASS GRAFT N/A 11/01/2014   Procedure: CORONARY ARTERY BYPASS GRAFTING (CABG) x 1 using internal mammary artery;  Surgeon: Rexene Alberts, MD;  Location: [facility];  Service: Open Heart Surgery;  Laterality: N/A;  . CORONARY CTO INTERVENTION N/A 04/12/2018   Procedure: CORONARY CTO INTERVENTION;  Surgeon: [name], MD;  Location: [facility] CV LAB;  Service: Cardiovascular;  Laterality: N/A;  . CORONARY STENT INTERVENTION N/A 04/12/2018   Procedure: CORONARY STENT INTERVENTION;  Surgeon: [name], MD;  Location: [facility] CV LAB;  Service: Cardiovascular;  Laterality: N/A;  . MITRAL VALVE REPAIR N/A 11/01/2014   Procedure: MITRAL VALVE REPAIR with no Ring;  Surgeon: Rexene Alberts, MD;  Location: [place];  Service: Open Heart Surgery;  Laterality: N/A;  . PROSTATECTOMY    . RIGHT/LEFT HEART CATH AND CORONARY ANGIOGRAPHY N/A 09/13/2019   Procedure: RIGHT/LEFT HEART CATH AND CORONARY ANGIOGRAPHY;  Surgeon: Martinique, Peter M, MD;  Location: [place] CV LAB;  Service: Cardiovascular;  Laterality:  N/A;  . RIGHT/LEFT HEART CATH AND CORONARY/GRAFT ANGIOGRAPHY N/A 04/12/2018   Procedure: RIGHT/LEFT HEART CATH AND CORONARY/GRAFT ANGIOGRAPHY;  Surgeon: [name], MD;  Location: [facility] CV LAB;  Service: Cardiovascular;  Laterality: N/A;  . TEE WITHOUT CARDIOVERSION N/A 10/20/2014   Procedure: TRANSESOPHAGEAL ECHOCARDIOGRAM (TEE);  Surgeon: Josue Hector, MD;  Location: [facility];  Service: Cardiovascular;  Laterality: N/A;  .  TEE WITHOUT CARDIOVERSION N/A 11/01/2014   Procedure: TRANSESOPHAGEAL ECHOCARDIOGRAM (TEE);  Surgeon: Rexene Alberts, MD;  Location: [facility];  Service: Open Heart Surgery;  Laterality: N/A;  . TEE WITHOUT CARDIOVERSION N/A 04/08/2018   Procedure: TRANSESOPHAGEAL ECHOCARDIOGRAM (TEE);  Surgeon: Jerline Pain, MD;  Location: [facility] ENDOSCOPY;  Service: Cardiovascular;  Laterality: N/A;  . TEE WITHOUT CARDIOVERSION N/A 07/28/2019   Procedure: TRANSESOPHAGEAL ECHOCARDIOGRAM (TEE);  Surgeon: Lelon Perla, MD;  Location: [facility] ENDOSCOPY;  Service: Cardiovascular;  Laterality: N/A;    ALLERGIES: Allergies  Allergen Reactions  . Pollen Extract Other (See Comments)    sneezing    MEDICATIONS: Current Outpatient Medications  Medication Sig Dispense Refill  . acetaminophen (TYLENOL) 325 MG tablet Take 2 tablets (650 mg total) by mouth every 4 (four) hours as needed for headache or mild pain. (Patient taking differently: Take 325 mg by mouth every 4 (four) hours as needed for headache or mild pain. )    . atorvastatin (LIPITOR) 40 MG tablet Take 1 tablet (40 mg total) by mouth daily. 30 tablet 11  . doxylamine, Sleep, (UNISOM) 25 MG tablet Take 12.5 mg by mouth at bedtime.    . furosemide (LASIX) 20 MG tablet Take 2 tablets (40 mg total) by mouth daily. 180 tablet 3  . gabapentin (NEURONTIN) 300 MG capsule Take 300 mg by mouth 2 (two) times daily. at dinner and at bedtime    . metoprolol tartrate (LOPRESSOR) 25 MG tablet Take 0.5 tablets (12.5 mg total) by mouth 2 (two) times daily. 30 tablet 6  . Multiple Vitamins-Minerals (PRESERVISION AREDS 2) CAPS Take 1 capsule by mouth every evening.     . Omega-3 Fatty Acids (OMEGA-3 PO) Take 1 capsule by mouth daily.    . potassium chloride SA (KLOR-CON) 20 MEQ tablet Take 1 tablet (20 mEq total) by mouth daily. 90 tablet 3  . Probiotic Product (PROBIOTIC PO) Take 1 capsule by mouth 2 (two) times daily.     . rivaroxaban (XARELTO) 20 MG TABS tablet Take 1 tablet (20  mg total) by mouth daily. 30 tablet 6  . Turmeric 500 MG CAPS Take 500 mg by mouth every evening.    Marland Kitchen albuterol (VENTOLIN HFA) 108 (90 Base) MCG/ACT inhaler Inhale 2 puffs into the lungs every 6 (six) hours as needed for wheezing or shortness of breath. (Patient not taking: Reported on 09/22/2019)    . nitroGLYCERIN (NITROSTAT) 0.4 MG SL tablet Place 1 tablet (0.4 mg total) under the tongue every 5 (five) minutes x 3 doses as needed for chest pain. (Patient not taking: Reported on 09/22/2019) 25 tablet 4   Current Facility-Administered Medications  Medication Dose Route Frequency Provider Last Rate Last Admin  . sodium chloride flush (NS) 0.9 % injection 3 mL  3 mL Intravenous Q12H Croitoru, Mihai, MD        LABS: Lab Results  Component Value Date   WBC 6.3 09/08/2019   HGB 13.3 09/13/2019   HCT 39.0 09/13/2019   MCV 85 09/08/2019   PLT 181 09/08/2019      Component Value Date/Time  NA 143 09/13/2019 1154   NA 139 09/08/2019 1443   K 3.9 09/13/2019 1154   CL 103 09/08/2019 1443   CO2 24 09/08/2019 1443   GLUCOSE 115 (H) 09/08/2019 1443   GLUCOSE 98 07/28/2019 0059   BUN 21 09/08/2019 1443   CREATININE 1.13 09/08/2019 1443   CREATININE 1.18 03/06/2016 0954   CALCIUM 9.6 09/08/2019 1443   GFRNONAA 65 09/08/2019 1443   GFRAA 75 09/08/2019 1443   Lab Results  Component Value Date   INR 1.7 (H) 07/28/2019   INR 2.4 (H) 07/26/2019   INR 1.9 01/11/2015   No results found for: PTT  SOCIAL HISTORY: Social History   Socioeconomic History  . Marital status: Married    Spouse name: Not on file  . Number of children: 1  . Years of education: Master's  . Highest education level: Not on file  Occupational History  . Occupation: Booksstore    Employer: [employer]  Tobacco Use  . Smoking status: Former Research scientist (life sciences)  . Smokeless tobacco: Never Used  . Tobacco comment: Smoked a pipe age 43->28  Substance and Sexual Activity  . Alcohol use: No    Alcohol/week: 0.0 standard drinks     Comment: Drank from age 40->38  . Drug use: No  . Sexual activity: Not on file  Other Topics Concern  . Not on file  Social History Narrative   Lives at home w/ his wife   Right-handed   Caffeine: 2 cups daily   Social Determinants of Health   Financial Resource Strain:   . Difficulty of Paying Living Expenses:   Food Insecurity:   . Worried About Charity fundraiser in the Last Year:   . Arboriculturist in the Last Year:   Transportation Needs:   . Film/video editor (Medical):   Marland Kitchen Lack of Transportation (Non-Medical):   Physical Activity:   . Days of Exercise per Week:   . Minutes of Exercise per Session:   Stress:   . Feeling of Stress :   Social Connections:   . Frequency of Communication with Friends and Family:   . Frequency of Social Gatherings with Friends and Family:   . Attends Religious Services:   . Active Member of Clubs or Organizations:   . Attends Archivist Meetings:   Marland Kitchen Marital Status:   Intimate Partner Violence:   . Fear of Current or Ex-Partner:   . Emotionally Abused:   Marland Kitchen Physically Abused:   . Sexually Abused:     FAMILY HISTORY: Family History  Problem Relation Age of Onset  . Stroke Father     REVIEW OF SYSTEMS: Reviewed with the patient as per History of present illness. Psych: Patient denies having dental phobia.  DENTAL HISTORY: CHIEF COMPLAINT: Patient referred by Dr. Roxy Manns for a dental consultation.  HPI: Alan [name] is a 69 year old male recently diagnosed with severe mitral regurgitation.  Patient with anticipated mitral valve repair or replacement heart surgery with Dr.  Roxy Manns.  The patient is now seen as part of the medically necessary preheart valve surgery dental protocol examination.  The patient currently denies acute toothaches, swellings, or abscesses.  Patient cannot remember the last time he was seen by his primary dentist.  Patient was seen on 10/24/2014 by Dental Medicine prior to his anticipated  aortic valve surgery.  The patient apparently has been seen by dentist since that time for multiple extractions and implant placement but the patient cannot remember the name  of that dentist.  Patient indicates that he does not seek regular dental care in spite of recommendations for regular dental care in 2016.  The patient is aware of the need to use antibiotic premedication prior to invasive dental procedures per American Heart Association guidelines due to his previous aortic valve replacement.  The patient denies having partial dentures.  The patient denies having dental phobia.  DENTAL EXAMINATION: GENERAL: The patient is a well-developed, well-nourished male in no acute distress. HEAD AND NECK: There is no palpable neck lymphadenopathy.  The patient denies having acute TMJ symptoms. INTRAORAL EXAM: The patient has normal saliva.  The patient has bilateral mandibular lingual tori.  There is no evidence of oral abscess formation.  The patient does have lesions on the dorsum of the tongue consistent with benign migratory glossitis. DENTITION: There are multiple missing teeth numbers 1, 2, 4, 5, 12, 14, 16, 17, 20, 29, and 32.  There is an unrestored implant in the area of tooth #4 below the gumline.  There are multiple malpositioned teeth noted as per dental charting form. PERIODONTAL: Patient has chronic periodontitis with plaque and calculus accumulations, gingival recession, and no significant tooth mobility.  There is incipient a moderate bone loss. DENTAL CARIES/SUBOPTIMAL RESTORATIONS: There are multiple dental caries noted as per dental charting form.  Multiple dental restorations are suboptimal secondary to recurrent caries. ENDODONTIC: The patient currently denies acute pulpitis symptoms.  Patient has had previous root canal therapy associated with tooth #13. CROWN AND BRIDGE: There are multiple crown restorations noted on tooth numbers 3, 13, 15, 18, 19, 30, and 31.  There are recurrent  caries noted on the mesial of the crown on tooth #15. PROSTHODONTIC: There are no partial dentures. OCCLUSION: Patient has a poor occlusal scheme secondary to multiple missing teeth, supra eruption and drifting of the unopposed teeth into the edentulous areas, multiple malpositioned teeth, and lack of replacement of all missing teeth with dental prostheses.  RADIOGRAPHIC INTERPRETATION: An orthopantogram was taken and supplemented with a full series of dental radiographs. There are multiple missing teeth.  There is supra eruption and drifting of the unopposed teeth into the edentulous areas.  There is an implant in the area of #4 that is not been restored with a definitive restoration.  There is incipient to moderate bone loss.  Multiple dental caries are noted.  Multiple crown restorations are noted.  There is a previous root canal therapy associated with tooth #13.   ASSESSMENTS: 1.  Severe mitral regurgitation 2.  Preheart valve surgery dental protocol 3.  Status post aortic valve replacement 4.  Dental caries 5.  Chronic periodontitis with bone loss 6.  Gingival recession 7.  Accretions 8.  Multiple missing teeth 9.  Nonrestorable implant in the area #4 that is currently below the level of the gum. 10.  Multiple malpositioned teeth 11.  Poor occlusal scheme and malocclusion 12.  Benign migratory glossitis 13.  Risk for bleeding with invasive dental procedures due to Xarelto therapy 14.  Need for antibiotic premedication prior to invasive dental procedures due to previous aortic valve replacement per American Heart Association guidelines.   PLAN/RECOMMENDATIONS: 1. I discussed the risks, benefits, and complications of various treatment options with the patient in relationship to his medical and dental conditions, anticipated heart valve surgery, and risk for endocarditis. We discussed various treatment options to include no treatment, periodontal therapy, dental restorations, root canal  therapy, crown and bridge therapy, implant therapy, and replacement of missing teeth as indicated.  The patient currently wishes to defer any dental treatment at this time.  The patient will follow up with the primary dentist of his choice for periodontal therapy, dental restorations, and evaluation for replacement missing teeth as indicated once medically stable from the anticipated heart valve surgery.  Patient is aware that he will need antibiotic premedication prior to invasive dental procedures.  A prescription for amoxicillin 500 mg with the directions of taking 4 capsules 1/2 to 1-hour prior to invasive dental procedures was sent to [facility] in [place], [place].  Patient was also prescribed PreviDent 5000 toothpaste for use at bedtime to assist in prevention for additional dental caries. The patient is currently cleared for heart valve surgery at this time.   2. Discussion of findings with medical team and coordination of future medical and dental care as needed.  I spent in excess of  120 minutes during the conduct of this consultation and >50% of this time involved direct face-to-face encounter for counseling and/or coordination of the patient's care.    Lenn Cal, DDS

## 2019-09-22 NOTE — Patient Instructions (Signed)
[facility]    Department of Dental Medicine     DR. Breena Bevacqua      HEART VALVES AND MOUTH CARE:  FACTS:   If you have any infection in your mouth, it can infect your heart valve.  If you heart valve is infected, you will be seriously ill.  Infections in the mouth can be SILENT and do not always cause pain.  Examples of infections in the mouth are gum disease, dental cavities, and abscesses.  Some possible signs of infection are: Bad breath, bleeding gums, or teeth that are sensitive to sweets, hot, and/or cold. There are many other signs as well.  WHAT YOU HAVE TO DO:   Brush your teeth after meals and at bedtime. Spend at least 2 minutes brushing well, especially behind your back teeth and all around your teeth that stand alone. Brush at the gumline also.  Do not go to bed without brushing your teeth and flossing.  If you gums bleed when you brush or floss, do NOT stop brushing or flossing. It usually means that your gums need more attention and better cleaning.   If your Dentist or Dr. Rabecca Birge gave you a prescription mouthwash to use, make sure to use it as directed. If you run out of the medication, get a refill at the pharmacy.   If you were given any other medications or directions by your Dentist, please follow them. If you did not understand the directions or forget what you were told, please call. We will be happy to refresh her memory.  If you need antibiotics before dental procedures, make sure you take them one hour prior to every dental visit as directed.   Get a dental checkup every 4-6 months in order to keep your mouth healthy, or to find and treat any new infection. You will most likely need your teeth cleaned or gums treated at the same time.  If you are not able to come in for your scheduled appointment, call your Dentist as soon as possible to reschedule.  If you have a problem in between dental visits, call your Dentist.  

## 2019-09-28 ENCOUNTER — Encounter: Payer: Self-pay | Admitting: Physical Therapy

## 2019-09-28 ENCOUNTER — Ambulatory Visit: Payer: Medicare Other | Attending: Thoracic Surgery (Cardiothoracic Vascular Surgery) | Admitting: Physical Therapy

## 2019-09-28 ENCOUNTER — Other Ambulatory Visit: Payer: Self-pay

## 2019-09-28 DIAGNOSIS — R262 Difficulty in walking, not elsewhere classified: Secondary | ICD-10-CM

## 2019-09-28 NOTE — Therapy (Signed)
[place], [place], [address] Phone: [phone]   Fax:  [phone]  Physical Therapy Evaluation/MVR  Patient Details  Name: Alan [name] MRN: [id number] Date of Birth: 10/04/1950 Referring Provider (PT): Darylene Price, MD   Encounter Date: 09/28/2019   PT End of Session - 09/28/19 1336    Visit Number 1    PT Start Time 1330    PT Stop Time 1400    PT Time Calculation (min) 30 min    Activity Tolerance Patient tolerated treatment well    Behavior During Therapy [facility] for tasks assessed/performed           Past Medical History:  Diagnosis Date  . 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 Atrial flutter with rapid ventricular response ([place]) s/p TEE/DCCV 07/28/19 successful 10/19/2014  . Coronary artery disease   . 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 Enterococcal bacteremia 10/19/2014  . Hepatitis   . Memory difficulty   . Migraine 09/12/2015  . 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 Paroxysmal atrial fibrillation ([place]) 10/19/2014  . Paroxysmal atrial flutter ([place]) 10/19/2014  . Prostate cancer ([place])   . Protein-calorie malnutrition, severe ([place])   . Restless leg syndrome   . 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/2014   23 mm [facility] Ease bovine pericardial bioprosthetic tissue valve with bovine pericardial patch enlargement of aortic root  . 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 S/P mitral valve repair 11/01/2014   Debridement of vegetation on anterior leaflet  . 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 Severe mitral regurgitation   . 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 Tricuspid regurgitation     Past Surgical History:  Procedure Laterality Date  . AORTIC ROOT ENLARGEMENT N/A 11/01/2014   Procedure: AORTIC ROOT ENLARGEMENT;  Surgeon:  Rexene Alberts, MD;  Location: [place];  Service: Open Heart Surgery;  Laterality: N/A;  . AORTIC VALVE REPLACEMENT N/A 11/01/2014   Procedure: AORTIC VALVE REPLACEMENT (AVR);  Surgeon: Rexene Alberts, MD;  Location: [place];  Service: Open Heart Surgery;  Laterality: N/A;  . CARDIAC CATHETERIZATION N/A 10/23/2014   Procedure: Right/Left Heart Cath and Coronary Angiography;  Surgeon: Lorretta Harp, MD;  Location: [place] CV LAB;  Service: Cardiovascular;  Laterality: N/A;  . CARDIOVERSION N/A 12/18/2014   Procedure: CARDIOVERSION;  Surgeon: Pixie Casino, MD;  Location: [facility] ENDOSCOPY;  Service: Cardiovascular;  Laterality: N/A;  . CARDIOVERSION N/A 02/06/2015   Procedure: CARDIOVERSION;  Surgeon: Larey Dresser, MD;  Location: [place];  Service: Cardiovascular;  Laterality: N/A;  . CARDIOVERSION N/A 04/07/2018   Procedure: CARDIOVERSION;  Surgeon: Sanda Klein, MD;  Location: [facility] ENDOSCOPY;  Service: Cardiovascular;  Laterality: N/A;  . CARDIOVERSION N/A 07/28/2019   Procedure: CARDIOVERSION;  Surgeon: Lelon Perla, MD;  Location: [facility] ENDOSCOPY;  Service: Cardiovascular;  Laterality: N/A;  . COLONOSCOPY N/A 10/25/2014   Procedure: COLONOSCOPY;  Surgeon: Arta Silence, MD;  Location: [facility] ENDOSCOPY;  Service: Endoscopy;  Laterality: N/A;  . CORONARY ARTERY BYPASS GRAFT N/A 11/01/2014   Procedure: CORONARY ARTERY BYPASS GRAFTING (CABG) x 1 using internal mammary artery;  Surgeon: Rexene Alberts, MD;  Location: [place];  Service: Open Heart Surgery;  Laterality: N/A;  . CORONARY CTO INTERVENTION N/A 04/12/2018   Procedure: CORONARY CTO INTERVENTION;  Surgeon: [name], MD;  Location:  [place] INVASIVE CV LAB;  Service: Cardiovascular;  Laterality: N/A;  . CORONARY STENT INTERVENTION N/A 04/12/2018   Procedure: CORONARY STENT INTERVENTION;  Surgeon: [name], MD;  Location: [place] CV LAB;  Service: Cardiovascular;  Laterality: N/A;  . MITRAL VALVE REPAIR N/A 11/01/2014   Procedure:  MITRAL VALVE REPAIR with no Ring;  Surgeon: Rexene Alberts, MD;  Location: [place];  Service: Open Heart Surgery;  Laterality: N/A;  . PROSTATECTOMY    . RIGHT/LEFT HEART CATH AND CORONARY ANGIOGRAPHY N/A 09/13/2019   Procedure: RIGHT/LEFT HEART CATH AND CORONARY ANGIOGRAPHY;  Surgeon: Martinique, Peter M, MD;  Location: [place] CV LAB;  Service: Cardiovascular;  Laterality: N/A;  . RIGHT/LEFT HEART CATH AND CORONARY/GRAFT ANGIOGRAPHY N/A 04/12/2018   Procedure: RIGHT/LEFT HEART CATH AND CORONARY/GRAFT ANGIOGRAPHY;  Surgeon: [name], MD;  Location: [place] CV LAB;  Service: Cardiovascular;  Laterality: N/A;  . TEE WITHOUT CARDIOVERSION N/A 10/20/2014   Procedure: TRANSESOPHAGEAL ECHOCARDIOGRAM (TEE);  Surgeon: Josue Hector, MD;  Location: [place];  Service: Cardiovascular;  Laterality: N/A;  . TEE WITHOUT CARDIOVERSION N/A 11/01/2014   Procedure: TRANSESOPHAGEAL ECHOCARDIOGRAM (TEE);  Surgeon: Rexene Alberts, MD;  Location: [place];  Service: Open Heart Surgery;  Laterality: N/A;  . TEE WITHOUT CARDIOVERSION N/A 04/08/2018   Procedure: TRANSESOPHAGEAL ECHOCARDIOGRAM (TEE);  Surgeon: Jerline Pain, MD;  Location: [facility] ENDOSCOPY;  Service: Cardiovascular;  Laterality: N/A;  . TEE WITHOUT CARDIOVERSION N/A 07/28/2019   Procedure: TRANSESOPHAGEAL ECHOCARDIOGRAM (TEE);  Surgeon: Lelon Perla, MD;  Location: [facility] ENDOSCOPY;  Service: Cardiovascular;  Laterality: N/A;    There were no vitals filed for this visit.    Subjective Assessment - 09/28/19 1333    Subjective Disturbed sleep, fatigue and SOB. SOB will wake me at night. A lot of HA for some reason. Occasional chest pain. When I do something I am knocked back the next day. Yesterday I had extreme vertigo/nausea when I woke up.    Currently in Pain? No/denies              [facility] PT Assessment - 09/28/19 0001      Assessment   Medical Diagnosis MVR    Referring Provider (PT) Darylene Price, MD    Onset Date/Surgical Date --   5  years ago, more CHF issues in last 8 mo   Hand Dominance Right      Precautions   Precautions None      Restrictions   Weight Bearing Restrictions No      Balance Screen   Has the patient fallen in the past 6 months No    Has the patient had a decrease in activity level because of a fear of falling?  Yes    Is the patient reluctant to leave their home because of a fear of falling?  No      Home Ecologist residence    Living Arrangements Spouse/significant other    Additional Comments no stairs      Prior Function   Level of Independence Independent    Vocation Retired      Associate Professor   Overall Cognitive Status Within Functional Limits for tasks assessed      Observation/Other Assessments   Focus on Therapeutic Outcomes (FOTO)  n/a TAVR      Sensation   Additional Comments WFL      Posture/Postural Control   Posture Comments WFL      ROM / Strength   AROM /  PROM / Strength AROM;Strength      AROM   Overall AROM Comments WFL      Strength   Overall Strength Comments gross 5/5     Strength Assessment Site Hand    Right/Left hand Right;Left    Right Hand Grip (lbs) 70    Left Hand Grip (lbs) 60            [facility] Pre-Surgical Assessment - 09/28/19 0001    5 Meter Walk Test- trial 1 2 sec    5 Meter Walk Test- trial 2 2 sec.     5 Meter Walk Test- trial 3 3 sec.    5 meter walk test average 2.33 sec    4 Stage Balance Test Position 3    Sit To Stand Test- trial 1 10 sec.    6 Minute Walk- Baseline yes    BP (mmHg) 113/73    HR (bpm) 74    02 Sat (%RA) 93 %    Modified Borg Scale for Dyspnea 0- Nothing at all    Perceived Rate of Exertion (Borg) 6-    6 Minute Walk Post Test yes    BP (mmHg) 126/79    HR (bpm) 79    02 Sat (%RA) 95 %    Modified Borg Scale for Dyspnea 4- somewhat severe    Perceived Rate of Exertion (Borg) 14-    Aerobic Endurance Distance Walked 1015    Endurance additional comments 41% disability compared  to age related normative values                    Objective measurements completed on examination: See above findings.                            Plan - 09/28/19 1408    PT Frequency One time visit    Consulted and Agree with Plan of Care Patient          Clinical Impression Statement: Pt is a 69 yo M presenting to OP PT for evaluation for MVR. Pt reports onset of SOB, fatigue and chest pain about 5 years ago. Symptoms are  limiting functional endurance. Pt presents with WFL ROM and strength and denies musculoskeletal pain at time of evaluation.  Pt ambulated a total of 1015 feet in 6 minute walk and reported 4/10 SOB on modified scale for dyspena and 14/20 RPE on Borg's perceived exertion and pain scale at the end of the walk. During the 6 minute walk test, patient's HR increased to 86 BPM and O2 saturation decreased to 94%. Based on the Short Physical Performance Battery, patient has a frailty rating of 11/12 with </= 5/12 considered frail.  Visit Diagnosis: Difficulty in walking, not elsewhere classified     Problem List Patient Active Problem List   Diagnosis Date Noted  . Tricuspid regurgitation   . Chest pain 07/25/2019  . Dyspnea 05/10/2019  . Acute diastolic CHF (congestive heart failure) ([place]) 04/13/2018  . Status post ligation of left atrial appendage 03/20/2018  . H/O mitral valve repair 03/20/2018  . History of aortic valve replacement with bioprosthetic valve 03/20/2018  . Recurrent major depressive disorder ([place]) 03/20/2018  . Coronary artery disease involving native coronary artery of native heart without angina pectoris   . Memory difficulty 02/06/2017  . Migraine 09/12/2015  . Vertigo 09/12/2015  . Atrial flutter ([place])   . Typical atrial flutter ([place])   .  Long term current use of anticoagulant therapy 11/17/2014  . 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/2014  . 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  . S/P mitral  valve repair 11/01/2014  . 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 History of endocarditis 2016   . Protein-calorie malnutrition, severe ([place])   . CAD S/P percutaneous coronary angioplasty 10/23/2014  . Septic embolism ([place])   . Leukocytosis   . Troponin level elevated   . Atherosclerotic peripheral vascular disease ([place]) 10/20/2014  . Aortic valve insufficiency, severe 10/20/2014  . History of stroke 10/20/2014  . Severe mitral regurgitation   . Enterococcal bacteremia 10/19/2014  . 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 Paroxysmal atrial fibrillation ([place]) 10/19/2014  . Atrial flutter with rapid ventricular response ([place]) s/p TEE/DCCV 07/28/19 successful 10/19/2014  . Acute encephalopathy 10/18/2014  . Rash 10/18/2014  . Normocytic anemia 10/18/2014  . Dizziness 10/18/2014  . Prostate cancer ([place]) 07/23/2011  . HTN (hypertension) 08/16/2008  . Headache 08/16/2008  . Depression with anxiety 06/29/2008  . Restless legs syndrome (RLS) 06/21/2007  . Allergic rhinitis 06/21/2007  . Attention deficit disorder 06/21/2007   Hikaru Delorenzo C. Mikena Masoner PT, DPT 09/28/19 2:11 PM        [facility] [address] [place], [place], [address] Phone: [phone]   Fax:  [phone]  Name: Alan [name] MRN: [id number] Date of Birth: [birth year]

## 2019-09-30 ENCOUNTER — Ambulatory Visit (HOSPITAL_COMMUNITY)
Admission: RE | Admit: 2019-09-30 | Discharge: 2019-09-30 | Disposition: A | Discharge status: Home / Self Care | Payer: Medicare Other | Source: Ambulatory Visit | Attending: Thoracic Surgery (Cardiothoracic Vascular Surgery) | Admitting: Thoracic Surgery (Cardiothoracic Vascular Surgery)

## 2019-09-30 ENCOUNTER — Other Ambulatory Visit: Payer: Self-pay

## 2019-09-30 ENCOUNTER — Encounter (HOSPITAL_COMMUNITY): Payer: Self-pay

## 2019-09-30 DIAGNOSIS — Z953 Presence of xenogenic heart valve: Secondary | ICD-10-CM | POA: Diagnosis present

## 2019-09-30 MED ORDER — IOHEXOL 350 MG/ML SOLN
100.0000 mL | Freq: Once | INTRAVENOUS | Status: AC | PRN
  Administered 2019-09-30: 100 mL via INTRAVENOUS

## 2019-09-30 MED ORDER — GADOBUTROL 1 MMOL/ML IV SOLN
7.0000 mL | Freq: Once | INTRAVENOUS | Status: AC | PRN
  Administered 2019-09-30: 7 mL via INTRAVENOUS

## 2019-09-30 MED ORDER — SODIUM CHLORIDE (PF) 0.9 % IJ SOLN
INTRAMUSCULAR | Status: AC
  Filled 2019-09-30: qty 50

## 2019-10-01 NOTE — Pre-Procedure Instructions (Addendum)
Your procedure is scheduled on Wednesday, August 11, from 08:30 AM- 2:48 PM.  Report to [facility] Main Entrance "A" at 06:30 A.M., and check in at the Admitting office.  Call this number if you have problems the morning of surgery:  [phone]  Call [phone] if you have any questions prior to your surgery date Monday-Friday 8am-4pm    Remember:  Do not eat or drink after midnight the night before your surgery.   *Stop taking Xarelto at least 7 days prior to surgery.  Continue taking all other medications without change through the day before surgery.  *On the morning of surgery do not take any medications.       The Morning of Surgery:                Do not wear jewelry.            Do not wear lotions, powders, colognes, or deodorant.            Men may shave face and neck.            Do not bring valuables to the hospital.            [facility] is not responsible for any belongings or valuables.  Do NOT Smoke (Tobacco/Vaping) or drink Alcohol 24 hours prior to your procedure. If you use a CPAP at night, you may bring all equipment for your overnight stay.   Contacts, glasses, dentures or bridgework may not be worn into surgery.      For patients admitted to the hospital, discharge time will be determined by your treatment team.   Patients discharged the day of surgery will not be allowed to drive home, and someone needs to stay with them for 24 hours.    Special instructions:   [place]- Preparing For Surgery  Before surgery, you can play an important role. Because skin is not sterile, your skin needs to be as free of germs as possible. You can reduce the number of germs on your skin by washing with CHG (chlorahexidine gluconate) Soap before surgery.  CHG is an antiseptic cleaner which kills germs and bonds with the skin to continue killing germs even after washing.    Oral Hygiene is also important to reduce your risk of infection.  Remember - BRUSH YOUR  TEETH THE MORNING OF SURGERY WITH YOUR REGULAR TOOTHPASTE  Please do not use if you have an allergy to CHG or antibacterial soaps. If your skin becomes reddened/irritated stop using the CHG.  Do not shave (including legs and underarms) for at least 48 hours prior to first CHG shower. It is OK to shave your face.  Please follow these instructions carefully.   1. Shower the NIGHT BEFORE SURGERY and the MORNING OF SURGERY with CHG Soap.   2. If you chose to wash your hair, wash your hair first as usual with your normal shampoo.  3. After you shampoo, rinse your hair and body thoroughly to remove the shampoo.  4. Use CHG as you would any other liquid soap. You can apply CHG directly to the skin and wash gently with a scrungie or a clean washcloth.   5. Apply the CHG Soap to your body ONLY FROM THE NECK DOWN.  Do not use on open wounds or open sores. Avoid contact with your eyes, ears, mouth and genitals (private parts). Wash Face and genitals (private parts)  with your normal soap.   6. Wash thoroughly, paying  special attention to the area where your surgery will be performed.  7. Thoroughly rinse your body with warm water from the neck down.  8. DO NOT shower/wash with your normal soap after using and rinsing off the CHG Soap.  9. Pat yourself dry with a CLEAN TOWEL.  10. Wear CLEAN PAJAMAS to bed the night before surgery  11. Place CLEAN SHEETS on your bed the night of your first shower and DO NOT SLEEP WITH PETS.   Day of Surgery: Wear Clean/Comfortable clothing the morning of surgery. Do not apply any deodorants/lotions.   Remember to brush your teeth WITH YOUR REGULAR TOOTHPASTE.   Please read over the following fact sheets that you were given.

## 2019-10-03 ENCOUNTER — Encounter: Payer: Self-pay | Admitting: Thoracic Surgery (Cardiothoracic Vascular Surgery)

## 2019-10-03 ENCOUNTER — Ambulatory Visit (INDEPENDENT_AMBULATORY_CARE_PROVIDER_SITE_OTHER): Payer: Medicare Other | Admitting: Thoracic Surgery (Cardiothoracic Vascular Surgery)

## 2019-10-03 ENCOUNTER — Other Ambulatory Visit: Payer: Self-pay

## 2019-10-03 ENCOUNTER — Other Ambulatory Visit (HOSPITAL_COMMUNITY)
Admission: RE | Admit: 2019-10-03 | Discharge: 2019-10-03 | Disposition: A | Discharge status: Home / Self Care | Payer: Medicare Other | Source: Ambulatory Visit | Attending: Thoracic Surgery (Cardiothoracic Vascular Surgery) | Admitting: Thoracic Surgery (Cardiothoracic Vascular Surgery)

## 2019-10-03 ENCOUNTER — Encounter (HOSPITAL_COMMUNITY)
Admission: RE | Admit: 2019-10-03 | Discharge: 2019-10-03 | Disposition: A | Discharge status: Home / Self Care | Payer: Medicare Other | Source: Ambulatory Visit | Attending: Thoracic Surgery (Cardiothoracic Vascular Surgery) | Admitting: Thoracic Surgery (Cardiothoracic Vascular Surgery)

## 2019-10-03 ENCOUNTER — Encounter (HOSPITAL_COMMUNITY): Payer: Self-pay

## 2019-10-03 ENCOUNTER — Ambulatory Visit (HOSPITAL_BASED_OUTPATIENT_CLINIC_OR_DEPARTMENT_OTHER)
Admission: RE | Admit: 2019-10-03 | Discharge: 2019-10-03 | Disposition: A | Discharge status: Home / Self Care | Payer: Medicare Other | Source: Ambulatory Visit | Attending: Thoracic Surgery (Cardiothoracic Vascular Surgery) | Admitting: Thoracic Surgery (Cardiothoracic Vascular Surgery)

## 2019-10-03 ENCOUNTER — Ambulatory Visit (HOSPITAL_COMMUNITY)
Admission: RE | Admit: 2019-10-03 | Discharge: 2019-10-03 | Disposition: A | Discharge status: Home / Self Care | Payer: Medicare Other | Source: Ambulatory Visit | Attending: Thoracic Surgery (Cardiothoracic Vascular Surgery) | Admitting: Thoracic Surgery (Cardiothoracic Vascular Surgery)

## 2019-10-03 VITALS — BP 112/74 | HR 72 | Temp 97.7°F | Resp 16 | Ht 72.0 in | Wt 168.0 lb

## 2019-10-03 DIAGNOSIS — Z951 Presence of aortocoronary bypass graft: Secondary | ICD-10-CM

## 2019-10-03 DIAGNOSIS — I480 Paroxysmal atrial fibrillation: Secondary | ICD-10-CM

## 2019-10-03 DIAGNOSIS — I4892 Unspecified atrial flutter: Secondary | ICD-10-CM

## 2019-10-03 DIAGNOSIS — I071 Rheumatic tricuspid insufficiency: Secondary | ICD-10-CM

## 2019-10-03 DIAGNOSIS — Z9889 Other specified postprocedural states: Secondary | ICD-10-CM | POA: Diagnosis not present

## 2019-10-03 DIAGNOSIS — Z9861 Coronary angioplasty status: Secondary | ICD-10-CM

## 2019-10-03 DIAGNOSIS — I2510 Atherosclerotic heart disease of native coronary artery without angina pectoris: Secondary | ICD-10-CM | POA: Insufficient documentation

## 2019-10-03 DIAGNOSIS — I340 Nonrheumatic mitral (valve) insufficiency: Secondary | ICD-10-CM | POA: Insufficient documentation

## 2019-10-03 DIAGNOSIS — Z01818 Encounter for other preprocedural examination: Secondary | ICD-10-CM | POA: Insufficient documentation

## 2019-10-03 DIAGNOSIS — Z20822 Contact with and (suspected) exposure to covid-19: Secondary | ICD-10-CM | POA: Insufficient documentation

## 2019-10-03 DIAGNOSIS — Z953 Presence of xenogenic heart valve: Secondary | ICD-10-CM

## 2019-10-03 HISTORY — DX: Transient cerebral ischemic attack, unspecified: G45.9

## 2019-10-03 HISTORY — DX: Unspecified osteoarthritis, unspecified site: M19.90

## 2019-10-03 HISTORY — DX: Depression, unspecified: F32.A

## 2019-10-03 HISTORY — DX: Dyspnea, unspecified: R06.00

## 2019-10-03 HISTORY — DX: Cardiac murmur, unspecified: R01.1

## 2019-10-03 HISTORY — DX: Cerebral infarction, unspecified: I63.9

## 2019-10-03 HISTORY — DX: Heart failure, unspecified: I50.9

## 2019-10-03 HISTORY — DX: Cardiac arrhythmia, unspecified: I49.9

## 2019-10-03 HISTORY — DX: Essential (primary) hypertension: I10

## 2019-10-03 LAB — BLOOD GAS, ARTERIAL
Acid-base deficit: 1.7 mmol/L (ref 0.0–2.0)
Bicarbonate: 22.3 mmol/L (ref 20.0–28.0)
Drawn by: 421801
FIO2: 21
O2 Saturation: 97.2 %
Patient temperature: 37
pCO2 arterial: 36.3 mmHg (ref 32.0–48.0)
pH, Arterial: 7.406 (ref 7.350–7.450)
pO2, Arterial: 93.3 mmHg (ref 83.0–108.0)

## 2019-10-03 LAB — URINALYSIS, ROUTINE W REFLEX MICROSCOPIC
Bilirubin Urine: NEGATIVE
Glucose, UA: NEGATIVE mg/dL
Hgb urine dipstick: NEGATIVE
Ketones, ur: NEGATIVE mg/dL
Leukocytes,Ua: NEGATIVE
Nitrite: NEGATIVE
Protein, ur: NEGATIVE mg/dL
Specific Gravity, Urine: 1.012 (ref 1.005–1.030)
pH: 5 (ref 5.0–8.0)

## 2019-10-03 LAB — COMPREHENSIVE METABOLIC PANEL
ALT: 22 U/L (ref 0–44)
AST: 24 U/L (ref 15–41)
Albumin: 3.9 g/dL (ref 3.5–5.0)
Alkaline Phosphatase: 56 U/L (ref 38–126)
Anion gap: 11 (ref 5–15)
BUN: 20 mg/dL (ref 8–23)
CO2: 23 mmol/L (ref 22–32)
Calcium: 9.4 mg/dL (ref 8.9–10.3)
Chloride: 105 mmol/L (ref 98–111)
Creatinine, Ser: 1.19 mg/dL (ref 0.61–1.24)
GFR calc Af Amer: >60 mL/min (ref >60)
GFR calc non Af Amer: >60 mL/min (ref >60)
Glucose, Bld: 133 mg/dL — ABNORMAL HIGH (ref 70–99)
Potassium: 4 mmol/L (ref 3.5–5.1)
Sodium: 139 mmol/L (ref 135–145)
Total Bilirubin: 1.4 mg/dL — ABNORMAL HIGH (ref 0.3–1.2)
Total Protein: 6.8 g/dL (ref 6.5–8.1)

## 2019-10-03 LAB — CBC
HCT: 43.1 % (ref 39.0–52.0)
Hemoglobin: 13.4 g/dL (ref 13.0–17.0)
MCH: 26.1 pg (ref 26.0–34.0)
MCHC: 31.1 g/dL (ref 30.0–36.0)
MCV: 84 fL (ref 80.0–100.0)
Platelets: 230 10*3/uL (ref 150–400)
RBC: 5.13 MIL/uL (ref 4.22–5.81)
RDW: 16.5 % — ABNORMAL HIGH (ref 11.5–15.5)
WBC: 6.5 10*3/uL (ref 4.0–10.5)
nRBC: 0 % (ref 0.0–0.2)

## 2019-10-03 LAB — SURGICAL PCR SCREEN
MRSA, PCR: NEGATIVE
Staphylococcus aureus: NEGATIVE

## 2019-10-03 LAB — SARS CORONAVIRUS 2 (TAT 6-24 HRS): SARS Coronavirus 2: NEGATIVE

## 2019-10-03 LAB — PROTIME-INR
INR: 1.3 — ABNORMAL HIGH (ref 0.8–1.2)
Prothrombin Time: 15.8 seconds — ABNORMAL HIGH (ref 11.4–15.2)

## 2019-10-03 LAB — APTT: aPTT: 33 seconds (ref 24–36)

## 2019-10-03 LAB — HEMOGLOBIN A1C
Hgb A1c MFr Bld: 6 % — ABNORMAL HIGH (ref 4.8–5.6)
Mean Plasma Glucose: 125.5 mg/dL

## 2019-10-03 NOTE — Progress Notes (Signed)
VASCULAR LAB    Pre CABG Dopplers completed.    Preliminary report:  See CV proc for preliminary results.  Emmaus Brandi, RVT 10/03/2019, 2:31 PM

## 2019-10-03 NOTE — Patient Instructions (Signed)
Do not take Xarelto  Continue taking all other medications without change through the day before surgery.  Make sure to bring all of your medications with you when you come for your Pre-Admission Testing appointment at [facility] Short-Stay Department.  Have nothing to eat or drink after midnight the night before surgery.  On the morning of surgery do not take any medications  At your appointment for Pre-Admission Testing at the [facility] Short-Stay Department you will be asked to sign permission forms for your upcoming surgery.  By definition your signature on these forms implies that you and/or your designee provide full informed consent for your planned surgical procedure(s), that alternative treatment options have been discussed, that you understand and accept any and all potential risks, and that you have some understanding of what to expect for your post-operative convalescence.  For any major cardiac surgical procedure potential operative risks include but are not limited to at least some risk of death, stroke or other neurologic complication, myocardial infarction, congestive heart failure, respiratory failure, renal failure, bleeding requiring blood transfusion and/or reexploration, irregular heart rhythm, heart block or bradycardia requiring permanent pacemaker, pneumonia, pericardial effusion, pleural effusion, wound infection, pulmonary embolus or other thromboembolic complication, chronic pain, or other complications related to the specific procedure(s) performed.  Please call to schedule a follow-up appointment in our office prior to surgery if you have any unresolved questions about your planned surgical procedure, the associated risks, alternative treatment options, and/or expectations for your post-operative recovery.

## 2019-10-03 NOTE — Progress Notes (Signed)
[address]       [place] [address]             [phone]     CARDIOTHORACIC SURGERY OFFICE NOTE  Primary Cardiologist is Sanda Klein, MD PCP is Aurea Graff, PA-C (Inactive)   HPI:  Patient returns to the office today for follow-up prior to elective high risk redo mitral valve replacement with possible Maze procedure and/or tricuspid valve repair scheduled for later this week.  He was last seen here in our office for consultation on September 21, 2019 at which time we made tentative plans for surgery.  He reports no new problems or complaints over the past 10 days although he states that he feels like his breathing continues to gradually get worse and he continues to have some atypical chest discomfort which is usually transient and fleeting and resolves within a few minutes.  He denies resting shortness of breath, PND, orthopnea, dizziness, or syncope.  He has not had any prolonged episodes of chest pain.  He has not had fevers, chills, no productive cough.   Current Outpatient Medications  Medication Sig Dispense Refill  . acetaminophen (TYLENOL) 325 MG tablet Take 2 tablets (650 mg total) by mouth every 4 (four) hours as needed for headache or mild pain. (Patient taking differently: Take 325 mg by mouth every 4 (four) hours as needed for headache or mild pain. )    . albuterol (VENTOLIN HFA) 108 (90 Base) MCG/ACT inhaler Inhale 2 puffs into the lungs every 6 (six) hours as needed for wheezing or shortness of breath.     Marland Kitchen amoxicillin (AMOXIL) 500 MG capsule Take four capsules 30 - 60 minutes before dental appointment. (Patient taking differently: Take 2,000 mg by mouth See admin instructions. Take four capsules (2000 mg) by mouth 30 - 60 minutes before dental appointment.) 4 capsule 2  . atorvastatin (LIPITOR) 40 MG tablet Take 1 tablet (40 mg total) by mouth daily. 30 tablet 11  . doxylamine, Sleep, (UNISOM) 25 MG tablet Take 12.5 mg by mouth at bedtime.      . furosemide (LASIX) 20 MG tablet Take 2 tablets (40 mg total) by mouth daily. 180 tablet 3  . gabapentin (NEURONTIN) 300 MG capsule Take 300 mg by mouth See admin instructions. Take one capsule (300 mg) by mouth twice daily - with supper and at bedtime    . metoprolol tartrate (LOPRESSOR) 25 MG tablet Take 0.5 tablets (12.5 mg total) by mouth 2 (two) times daily. 30 tablet 6  . Multiple Vitamins-Minerals (PRESERVISION AREDS 2) CAPS Take 1 capsule by mouth every evening.     . nitroGLYCERIN (NITROSTAT) 0.4 MG SL tablet Place 1 tablet (0.4 mg total) under the tongue every 5 (five) minutes x 3 doses as needed for chest pain. 25 tablet 4  . Omega-3 Fatty Acids (OMEGA-3 PO) Take 1 capsule by mouth daily.    . potassium chloride SA (KLOR-CON) 20 MEQ tablet Take 1 tablet (20 mEq total) by mouth daily. 90 tablet 3  . Probiotic Product (PROBIOTIC PO) Take 1 capsule by mouth 2 (two) times daily.     . SERTRALINE HCL PO Take 1 tablet by mouth daily.    . sodium fluoride (PREVIDENT 5000 PLUS) 1.1 % CREA dental cream Apply to tooth brush. Brush teeth for 2 minutes. Spit out excess-DO NOT swallow. DO NOT rinse afterwards. Repeat nightly. (Patient taking differently: Place 1 application onto teeth See admin instructions. Apply to tooth  brush. Brush teeth for 2 minutes. Spit out excess-DO NOT swallow. DO NOT rinse afterwards. Repeat nightly.) 51 g prn  . Turmeric 500 MG CAPS Take 500 mg by mouth every evening.    . rivaroxaban (XARELTO) 20 MG TABS tablet Take 1 tablet (20 mg total) by mouth daily. (Patient not taking: Reported on 10/03/2019) 30 tablet 6   Current Facility-Administered Medications  Medication Dose Route Frequency Provider Last Rate Last Admin  . sodium chloride flush (NS) 0.9 % injection 3 mL  3 mL Intravenous Q12H Croitoru, Mihai, MD          Physical Exam:   BP 112/74 (BP Location: Left Arm, Patient Position: Sitting, Cuff Size: Normal)   Pulse 72   Temp 97.7 F (36.5 C)   Resp 16   Ht  6' (1.829 m)   Wt 168 lb (76.2 kg)   SpO2 98% Comment: RA  BMI 22.78 kg/m   General:  Thin and somewhat weak but otherwise well-appearing  Chest:   Slightly diminished breath sounds right lung base  CV:   Regular rate and rhythm with systolic murmur  Incisions:  n/a  Abdomen:  Soft nontender  Extremities:  Warm and well-perfused, no edema  Diagnostic Tests:  MRI HEAD WITHOUT AND WITH CONTRAST  TECHNIQUE: Multiplanar, multiecho pulse sequences of the brain and surrounding structures were obtained without and with intravenous contrast.  CONTRAST:  13mL GADAVIST GADOBUTROL 1 MMOL/ML IV SOLN  COMPARISON:  02/19/2017  FINDINGS: Brain: No acute infarct, mass, midline shift, or extra-axial fluid collection is identified. Scattered chronic cerebral and cerebellar microhemorrhages are unchanged. Small chronic infarcts in the left frontal lobe, both parietal lobes, both occipital lobes, and cerebellum are unchanged. Small T2 hyperintensities in the cerebral white matter bilaterally are also unchanged and nonspecific but compatible with mild chronic small vessel ischemic disease. Mild generalized cerebral atrophy is within normal limits for age. No abnormal enhancement is identified.  Vascular: Major intracranial vascular flow voids are preserved.  Skull and upper cervical spine: No suspicious marrow lesion  Sinuses/Orbits: Bilateral cataract extraction. Subcentimeter mucous retention cyst in the left maxillary sinus. Trace left mastoid fluid.  Other: None.  IMPRESSION: 1. No acute intracranial abnormality or mass. 2. Chronic ischemia with multiple unchanged infarcts.   Electronically Signed   By: Logan Bores M.D.   On: 09/30/2019 08:52    CT ANGIOGRAPHY CHEST, ABDOMEN AND PELVIS  TECHNIQUE: Non-contrast CT of the chest was initially obtained.  Multidetector CT imaging through the chest, abdomen and pelvis was performed using the standard protocol  during bolus administration of intravenous contrast. Multiplanar reconstructed images and MIPs were obtained and reviewed to evaluate the vascular anatomy.  CONTRAST:  140mL OMNIPAQUE IOHEXOL 350 MG/ML SOLN  COMPARISON:  Prior CTA of the chest 07/25/2019  FINDINGS: CTA CHEST FINDINGS  Cardiovascular: Initial contrast enhanced images were performed in the pulmonary arterial phase. The main pulmonary artery is normal in size. No filling defect to suggest pulmonary embolus.  Imaging was then repeated to ensure appropriate opacification of the aorta. Three vessel aortic arch. Scattered atherosclerotic vascular calcifications. 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 The tubular portion of the ascending thoracic aorta is mildly ectatic at 3.8 cm but not aneurysmal. There is no evidence of thoracic aortic dissection. Cardiomegaly with marked right ventricular and right atrial dilation. Contrast material refluxes into the dilated intrahepatic IVC and hepatic veins. Calcifications are present along the coronary arteries. No pericardial effusion. Left atrial appendage ligation clip noted.  Mediastinum/Nodes: Unremarkable CT appearance of the thyroid gland. No suspicious mediastinal or hilar adenopathy. No soft tissue mediastinal mass. The thoracic esophagus is unremarkable.  Lungs/Pleura: Moderate right and small left layering pleural effusions. Diffuse mild to moderate bronchial wall thickening. Respiratory motion limits evaluation for small pulmonary nodules. Mild interlobular septal thickening consistent with trace interstitial edema. Dependent atelectasis in the lower lobes.  Musculoskeletal: No acute fracture or aggressive appearing lytic or blastic osseous lesion. Healed median sternotomy.  Review of the MIP images confirms the above findings.  CTA ABDOMEN AND PELVIS FINDINGS  VASCULAR  Aorta: Normal caliber aorta without aneurysm,  dissection, vasculitis or significant stenosis.  Celiac: Patent without evidence of aneurysm, dissection, vasculitis or significant stenosis.  SMA: Patent without evidence of aneurysm, dissection, vasculitis or significant stenosis.  Renals: Multiple renal arteries. On the right, there is a dominant renal artery with a small accessory artery supplying the lower pole. On the left, there are 3 small co-dominant renal arteries.  IMA: Patent without evidence of aneurysm, dissection, vasculitis or significant stenosis.  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 The maximal diameter is 1.5 cm. Otherwise, mild scattered atherosclerotic plaque without significant stenosis, dissection or other aneurysm.  Veins: No obvious venous abnormality within the limitations of this arterial phase study.  Review of the MIP images confirms the above findings.  NON-VASCULAR  Hepatobiliary: No focal liver abnormality is seen. No gallstones, gallbladder wall thickening, or biliary dilatation.  Pancreas: Unremarkable. No pancreatic ductal dilatation or surrounding inflammatory changes.  Spleen: Normal in size without focal abnormality.  Adrenals/Urinary Tract: Normal adrenal glands. Renal size discrepancy with the right kidney larger than the left. Circumscribed 1.1 cm low-attenuation lesion in the interpolar right kidney is too small to characterize but statistically highly likely a benign cyst. The ureters and bladder are unremarkable.  Stomach/Bowel: Stomach is within normal limits. Appendix appears normal. No evidence of bowel wall thickening, distention, or inflammatory changes.  Lymphatic: No suspicious lymphadenopathy.  Reproductive: Surgical changes suggest prior prostatectomy.  Other: No abdominal wall hernia. Trace free fluid in the  pelvic recess.  Musculoskeletal: No acute fracture or aggressive appearing lytic or blastic osseous lesion.  Review of the MIP images confirms the above findings.  IMPRESSION: 1. No evidence of thoracic or abdominal aortic dissection or other acute arterial abnormality. 2. CT findings suggest significantly elevated right heart pressures and hepatic venous congestion. Differential considerations include tricuspid regurgitation and right-sided congestive heart failure. 3. Moderate right and small left layering pleural effusions. 4. Cardiomegaly with right heart dilation, particularly the right atrium. 5. Surgical changes of aortic valve replacement without evidence of complication. 6. Mild interstitial pulmonary edema. 7. No specific infectious or inflammatory process identified in the abdomen. However, there is a small amount of free fluid layering in the pelvic cul-de-sac which is abnormal in a male patient. Given the clinical history of abdominal pain, this could herald some form of infectious or inflammatory process involving the bowel which is otherwise occult by imaging. However, given the findings of elevated right heart pressures and passive venous congestion in the liver, a small amount of related simple ascites is also possible. 8.  Aortic Atherosclerosis (ICD10-170.0). 9. 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 Maximal diameter 1.5 cm.   Electronically Signed   By: Jacqulynn Cadet M.D.   On: 09/30/2019 08:25   Impression:  Patient has  severe symptomatic primary mitral regurgitation with recurrent atrial fibrillation and atrial flutter.  He presents with progressive symptoms of exertional shortness of breath and fatigue with occasional resting shortness of breath, orthopnea, and PND consistent with chronic diastolic congestive heart failure, New York Heart Association functional class  IIIb.  Symptoms seem to be gradually getting worse despite the fact the patient has been maintaining sinus rhythm since recent DC cardioversion.  I have personally reviewed the patient's multiple transthoracic and transesophageal echocardiograms as well as his recent diagnostic cardiac catheterization.  The patient has normal left ventricular systolic function with ejection fraction estimated 55 to 60% at the time of most recent follow-up echocardiogram.  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  There is severe mitral regurgitation.  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  Moreover, the patient has worsening congestive heart failure with recent development of poor appetite, significant weight loss, and worsening generalized weakness.  Follow-up MRI of the head revealed stable old multiple infarcts with no sign of progression of cerebrovascular disease or recurrent embolization.  CTA of the abdomen and pelvis reveals no contraindications to peripheral cannulation for surgery.   Plan:  The patient and his family were again counseled at length regarding the indications, risks and potential benefits of mitral valve replacement.  The rationale for elective surgery has been explained, including a comparison between surgery and continued medical therapy with close follow-up.  The likelihood of successful and durable mitral valve repair has been discussed with particular reference to the findings of their recent echocardiogram.  Based upon these findings and previous experience, I feel that successful and durable valve  repair is unlikely and we should plan on mitral valve replacement.  Alternative surgical approaches have been discussed including a comparison between conventional sternotomy and minimally-invasive techniques.  The relative risks and benefits of each have been reviewed as they pertain to the patient's specific circumstances, and expectations for the patient's postoperative convalescence has been discussed.    The possibility of need for conversion to conventional median sternotomy was discussed.  The relative risks and benefits of performing a maze procedure at the time of his surgery was discussed at length, including the expected likelihood of long term freedom from recurrent symptomatic atrial fibrillation and/or atrial flutter.  The presence of secondary tricuspid regurgitation was discussed including the possibility that concomitant tricuspid valve repair might be necessary.  Assuming that the patient's valve cannot be successfully repaired, we discussed the possibility of replacing the mitral valve using a mechanical prosthesis with the attendant need for long-term anticoagulation versus the alternative of replacing it using a bioprosthetic tissue valve with its potential for late structural valve deterioration and failure, depending upon the patient's longevity.  The patient specifically requests that if the mitral valve must be replaced that it be done using a bioprosthetic valve.     We plan to proceed with surgery on Wednesday, October 05, 2019 as previously planned.      The patient understands and accepts all potential risks of surgery including but not limited to risk of death, stroke or other neurologic complication, myocardial infarction, congestive heart failure, respiratory failure, renal failure, bleeding requiring transfusion and/or reexploration, arrhythmia, infection  or other wound complications, pneumonia, pleural and/or pericardial effusion, pulmonary embolus, aortic dissection or other major  vascular complication, or delayed complications related to valve repair or replacement including but not limited to structural valve deterioration and failure, thrombosis, embolization, endocarditis, or paravalvular leak.  Specific risks potentially related to the minimally-invasive approach were discussed at length, including but not limited to risk of conversion to full or partial sternotomy, aortic dissection or other major vascular complication, unilateral acute lung injury or pulmonary edema, phrenic nerve dysfunction or paralysis, rib fracture, chronic pain, lung hernia, or lymphocele. All of their questions have been answered.    I spent in excess of 30 minutes during the conduct of this office consultation and >50% of this time involved direct face-to-face encounter with the patient for counseling and/or coordination of their care.    [name]. Roxy Manns, MD 10/03/2019 4:29 PM

## 2019-10-03 NOTE — H&P (Signed)
[address]       [place] [address]             [phone]          CARDIOTHORACIC SURGERY HISTORY AND PHYSICAL EXAM  Referring Provider is Croitoru, Dani Gobble, MD PCP is Aurea Graff, PA-C (Inactive)   Chief Complaint  Patient presents with  . 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 status post aortic valve replacement with root enlargement using a stented bioprosthetic tissue valve, coronary artery disease status post coronary artery bypass grafting and more recently status post PCI and stenting of the posterior descending coronary artery, mitral regurgitation, chronic diastolic congestive heart failure, recurrent atrial fibrillation and atrial flutter status post DC cardioversion on multiple occasions currently maintaining sinus rhythm on long-term anticoagulation using Xarelto, restless leg syndrome, and remote history of prostate cancer who has been referred for surgical consultation to discuss treatment options for management of severe symptomatic mitral regurgitation.  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 At the time he was incidentally found to have multivessel coronary artery disease. He underwent aortic valve replacement using a 23 mm Edwards MagnaEase stented bioprosthetic tissue valvewith bovine pericardial patch enlargement of the aortic root, mitral valve repair without ring annuloplasty, coronary artery bypass grafting using left internal mammary artery to the distal left anterior descending coronary artery,and clipping  of the left atrial appendage. Intraoperative findings were notable for bulky vegetations on the aortic valve with significant leaflet destruction and severe aortic insufficiency but no annular abscess formation. The mitral valve was functioning normally although there was clearly pre-existing sclerosis and thickening of the anterior leaflet of the mitral valve. Mitral valve repair consisted only of debridement of the small vegetations adherent to the atrial surface of the anterior leaflet. Ring annuloplasty was not performed because the patient did not have significant mitral regurgitation. The patient's postoperative convalescence was notable for recurrent atrial flutter for which he underwent cardioversion. He otherwise did quite well and routine follow-up echocardiogram performed 3 months postoperatively revealed normal left ventricular systolic function, normal functioning bioprosthetic tissue valve in the aortic position, mild mitral regurgitation, and mild mitral stenosis with mean transvalvular gradient across the mitral valve measured 5 mmHg.  Patient did very well from a cardiac standpoint until November 2019 when he began to develop progressive symptoms of exertional shortness of breath.  He was hospitalized acutely in February 2020 with chest pain and acute exacerbation of congestive heart failure in the setting of rapid atrial flutter.  He was cardioverted back into sinus rhythm but TEE revealed severe mitral regurgitation.  Catheterization revealed multivessel coronary artery disease with continued patency of left internal mammary artery to the distal left anterior descending coronary artery but further progression of severe stenosis involving posterior descending coronary artery arising off the left circumflex.  This was treated with PCI and stenting using a drug-eluting stent.  Patient again did well until May of this year when he presented with heart failure exacerbation in the setting of  recurrent atrial flutter with rapid ventricular response.  He again underwent TEE and  cardioversion.  TEE revealed severe mitral regurgitation with preserved left ventricular function.  The bioprosthetic tissue valve in the aortic position was functioning normally.  The patient was seen in follow-up recently by Dr. Sallyanne Kuster at which time he remained in sinus rhythm but he was complaining of worsening symptoms of congestive heart failure, New York Heart Association functional class IIIb.  Repeat echocardiogram confirmed the presence of persistent severe mitral regurgitation despite the fact the patient remained in sinus rhythm with normal left ventricular systolic function.  Left and right heart catheterization was performed September 13, 2019 and revealed multivessel coronary artery disease with chronic occlusion of left anterior descending coronary artery but continued patency of left internal mammary artery to the distal left anterior descending coronary artery.  There remain patent stents in the first obtuse marginal and posterior descending branches of the left circumflex coronary artery.  There was severe pulmonary hypertension with high filling pressures and large V waves on pulmonary capillary wedge tracing consistent with severe mitral regurgitation.  Cardiothoracic surgical consultation was requested.  Patient is married and lives locally in [place] with his wife.  He remains reasonably active physically and functionally independent, although he complains that he has been having increasing problems over the last several months.  He has severe exertional shortness of breath and occasionally gets short of breath at rest.  He has been having some trouble sleeping at night.  He asked variances occasional PND and orthopnea.  He has not had lower extremity edema.  He occasionally has fleeting episodes of sharp pain across his chest which are not related to physical exertion and typically only last a few  seconds or minutes.  He has not had dizzy spells or syncope.  He has some mild chronic problems with balance but this has been worse recently.  He has had a poor appetite and lost 25 pounds in weight over the six several months.  He denies any fevers or chills.  He has had problems with intermittent headaches which occasionally can be severe.  He otherwise denies any transient focal neurologic symptoms.  He has not been to a dentist in several years although he denies any loose teeth or painful teeth to suggest ongoing infection.  Patient returns to the office today for follow-up prior to elective high risk redo mitral valve replacement with possible Maze procedure and/or tricuspid valve repair scheduled for later this week.  He was last seen here in our office for consultation on September 21, 2019 at which time we made tentative plans for surgery.    Since then he has been seen in follow-up in the dental service clinic by Dr. Lawana Chambers and cleared for surgery.  He reports no new problems or complaints over the past 10 days although he states that he feels like his breathing continues to gradually get worse and he continues to have some atypical chest discomfort which is usually transient and fleeting and resolves within a few minutes.  He denies resting shortness of breath, PND, orthopnea, dizziness, or syncope.  He has not had any prolonged episodes of chest pain.  He has not had fevers, chills, no productive cough.   Past Medical History:  Diagnosis Date  . 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 Arthritis    hands  . Atrial flutter with rapid ventricular response ([place]) s/p TEE/DCCV 07/28/19 successful 10/19/2014  . CHF (congestive heart failure) ([place])   . Coronary artery disease   .  Depression   . Dyspnea   . Dysrhythmia   . 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 Enterococcal bacteremia 10/19/2014  . Heart murmur    . Hepatitis    C  . Hypertension   . Memory difficulty   . Migraine 09/12/2015  . Mini stroke ([place])    x22  . 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 Paroxysmal atrial fibrillation ([place]) 10/19/2014  . Paroxysmal atrial flutter ([place]) 10/19/2014  . Prostate cancer ([place]) dx'd 2015   surg only per pt  . Protein-calorie malnutrition, severe ([place])   . Restless leg syndrome   . 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/2014   23 mm [facility] Ease bovine pericardial bioprosthetic tissue valve with bovine pericardial patch enlargement of aortic root  . 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 S/P mitral valve repair 11/01/2014   Debridement of vegetation on anterior leaflet  . Septic embolism ([place]) 10/20/2014   brain and spleen, subclinical  . Severe mitral regurgitation   . 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 Tricuspid regurgitation     Past Surgical History:  Procedure Laterality Date  . AORTIC ROOT ENLARGEMENT N/A 11/01/2014   Procedure: AORTIC ROOT ENLARGEMENT;  Surgeon: Rexene Alberts, MD;  Location: [place];  Service: Open Heart Surgery;  Laterality: N/A;  . AORTIC VALVE REPLACEMENT N/A 11/01/2014   Procedure: AORTIC VALVE REPLACEMENT (AVR);  Surgeon: Rexene Alberts, MD;  Location: [place];  Service: Open Heart Surgery;  Laterality: N/A;  . CARDIAC CATHETERIZATION N/A 10/23/2014   Procedure: Right/Left Heart Cath and Coronary Angiography;  Surgeon: Lorretta Harp, MD;  Location: [place] CV LAB;  Service: Cardiovascular;  Laterality: N/A;  . CARDIOVERSION N/A 12/18/2014   Procedure: CARDIOVERSION;  Surgeon: Pixie Casino, MD;  Location: [facility] ENDOSCOPY;  Service: Cardiovascular;  Laterality: N/A;  . CARDIOVERSION N/A 02/06/2015   Procedure: CARDIOVERSION;  Surgeon: Larey Dresser, MD;  Location: [place];  Service: Cardiovascular;  Laterality: N/A;  . CARDIOVERSION N/A 04/07/2018   Procedure: CARDIOVERSION;  Surgeon: Sanda Klein, MD;  Location: [facility]  ENDOSCOPY;  Service: Cardiovascular;  Laterality: N/A;  . CARDIOVERSION N/A 07/28/2019   Procedure: CARDIOVERSION;  Surgeon: Lelon Perla, MD;  Location: [facility] ENDOSCOPY;  Service: Cardiovascular;  Laterality: N/A;  . COLONOSCOPY N/A 10/25/2014   Procedure: COLONOSCOPY;  Surgeon: Arta Silence, MD;  Location: [facility] ENDOSCOPY;  Service: Endoscopy;  Laterality: N/A;  . CORONARY ARTERY BYPASS GRAFT N/A 11/01/2014   Procedure: CORONARY ARTERY BYPASS GRAFTING (CABG) x 1 using internal mammary artery;  Surgeon: Rexene Alberts, MD;  Location: [place];  Service: Open Heart Surgery;  Laterality: N/A;  . CORONARY CTO INTERVENTION N/A 04/12/2018   Procedure: CORONARY CTO INTERVENTION;  Surgeon: [name], MD;  Location: [place] CV LAB;  Service: Cardiovascular;  Laterality: N/A;  . CORONARY STENT INTERVENTION N/A 04/12/2018   Procedure: CORONARY STENT INTERVENTION;  Surgeon: [name], MD;  Location: [place] CV LAB;  Service: Cardiovascular;  Laterality: N/A;  . EYE SURGERY Bilateral    cataracts  . MITRAL VALVE REPAIR N/A 11/01/2014   Procedure: MITRAL VALVE REPAIR with no Ring;  Surgeon: Rexene Alberts, MD;  Location: [place];  Service: Open Heart Surgery;  Laterality: N/A;  . PROSTATECTOMY    . RIGHT/LEFT HEART CATH AND CORONARY ANGIOGRAPHY N/A 09/13/2019   Procedure: RIGHT/LEFT HEART CATH AND CORONARY ANGIOGRAPHY;  Surgeon: Martinique, Peter M, MD;  Location: [place] CV LAB;  Service: Cardiovascular;  Laterality: N/A;  . RIGHT/LEFT HEART CATH AND CORONARY/GRAFT ANGIOGRAPHY N/A 04/12/2018   Procedure: RIGHT/LEFT HEART CATH AND CORONARY/GRAFT ANGIOGRAPHY;  Surgeon: [name], MD;  Location: [place] CV LAB;  Service: Cardiovascular;  Laterality: N/A;  . TEE WITHOUT CARDIOVERSION N/A 10/20/2014   Procedure: TRANSESOPHAGEAL ECHOCARDIOGRAM (TEE);  Surgeon: Josue Hector, MD;  Location: [place];  Service: Cardiovascular;  Laterality: N/A;  . TEE WITHOUT CARDIOVERSION N/A 11/01/2014    Procedure: TRANSESOPHAGEAL ECHOCARDIOGRAM (TEE);  Surgeon: Rexene Alberts, MD;  Location: [place];  Service: Open Heart Surgery;  Laterality: N/A;  . TEE WITHOUT CARDIOVERSION N/A 04/08/2018   Procedure: TRANSESOPHAGEAL ECHOCARDIOGRAM (TEE);  Surgeon: Jerline Pain, MD;  Location: [facility] ENDOSCOPY;  Service: Cardiovascular;  Laterality: N/A;  . TEE WITHOUT CARDIOVERSION N/A 07/28/2019   Procedure: TRANSESOPHAGEAL ECHOCARDIOGRAM (TEE);  Surgeon: Lelon Perla, MD;  Location: [facility] ENDOSCOPY;  Service: Cardiovascular;  Laterality: N/A;  . TONSILLECTOMY      Family History  Problem Relation Age of Onset  . Stroke Father     Social History Social History   Tobacco Use  . Smoking status: Former Research scientist (life sciences)  . Smokeless tobacco: Never Used  . Tobacco comment: Smoked a pipe age 44->28  Vaping Use  . Vaping Use: Never used  Substance Use Topics  . Alcohol use: No    Alcohol/week: 0.0 standard drinks    Comment: Drank from age 30->38  . Drug use: No    Prior to Admission medications   Medication Sig Start Date End Date Taking? Authorizing Provider  acetaminophen (TYLENOL) 325 MG tablet Take 2 tablets (650 mg total) by mouth every 4 (four) hours as needed for headache or mild pain. Patient taking differently: Take 325 mg by mouth every 4 (four) hours as needed for headache or mild pain.  07/28/19  Yes Isaiah Serge, NP  albuterol (VENTOLIN HFA) 108 (90 Base) MCG/ACT inhaler Inhale 2 puffs into the lungs every 6 (six) hours as needed for wheezing or shortness of breath.    Yes [provider]  amoxicillin (AMOXIL) 500 MG capsule Take four capsules 30 - 60 minutes before dental appointment. Patient taking differently: Take 2,000 mg by mouth See admin instructions. Take four capsules (2000 mg) by mouth 30 - 60 minutes before dental appointment. 09/22/19  Yes Lenn Cal, DDS  atorvastatin (LIPITOR) 40 MG tablet Take 1 tablet (40 mg total) by mouth daily. 10/07/18 07/24/28 Yes Croitoru,  Mihai, MD  doxylamine, Sleep, (UNISOM) 25 MG tablet Take 12.5 mg by mouth at bedtime.   Yes [provider]  furosemide (LASIX) 20 MG tablet Take 2 tablets (40 mg total) by mouth daily. 08/01/19 07/31/20 Yes Croitoru, Mihai, MD  gabapentin (NEURONTIN) 300 MG capsule Take 300 mg by mouth See admin instructions. Take one capsule (300 mg) by mouth twice daily - with supper and at bedtime   Yes [provider]  metoprolol tartrate (LOPRESSOR) 25 MG tablet Take 0.5 tablets (12.5 mg total) by mouth 2 (two) times daily. 07/28/19  Yes Isaiah Serge, NP  Multiple Vitamins-Minerals (PRESERVISION AREDS 2) CAPS Take 1 capsule by mouth every evening.    Yes [provider]  nitroGLYCERIN (NITROSTAT) 0.4 MG SL tablet Place 1 tablet (0.4 mg total) under the tongue every 5 (five) minutes x 3 doses as needed for chest pain. 07/28/19  Yes Isaiah Serge, NP  Omega-3 Fatty Acids (OMEGA-3 PO) Take 1 capsule by mouth daily.   Yes [provider]  potassium chloride SA (KLOR-CON) 20 MEQ tablet Take 1 tablet (20 mEq total) by mouth daily. 05/10/19  Yes Kilroy, Doreene Burke, PA-C  Probiotic Product (PROBIOTIC PO) Take 1 capsule by mouth 2 (two) times daily.    Yes [provider]  sertraline (ZOLOFT) 50 MG tablet Take 50 mg by mouth daily.   Yes [provider]  sodium fluoride (PREVIDENT 5000 PLUS) 1.1 % CREA dental cream Apply to tooth brush. Brush teeth for 2 minutes. Spit out excess-DO NOT swallow. DO NOT rinse afterwards. Repeat nightly. Patient taking differently: Place 1 application onto teeth See admin instructions. Apply to tooth brush. Brush teeth for 2 minutes. Spit out excess-DO NOT swallow. DO NOT rinse afterwards. Repeat nightly. 09/22/19  Yes Lenn Cal, DDS  Turmeric 500 MG CAPS Take 500 mg by mouth every evening.   Yes [provider]  rivaroxaban (XARELTO) 20 MG TABS tablet Take 1 tablet (20 mg total) by mouth daily. 09/14/19   Martinique, Peter M, MD     Allergies  Allergen Reactions  . Pollen Extract Other (See Comments)    sneezing   Review of Systems:              General:                      poor appetite, decreased energy, no weight gain, + weight loss, no fever             Cardiac:                       no chest pain with exertion, occasional brief episodes atypical chest pain at rest, + SOB with low level exertion, occasional resting SOB, occasional PND, + orthopnea, no palpitations, + arrhythmia, + atrial fibrillation, no LE edema, no dizzy spells, no syncope             Respiratory:                 + shortness of breath, no home oxygen, no productive cough, no dry cough, no bronchitis, no wheezing, no hemoptysis, no asthma, no pain with inspiration or cough, no sleep apnea, no CPAP at night             GI:                               no difficulty swallowing, no reflux, no frequent heartburn, no hiatal hernia, no abdominal pain, no constipation, occasional diarrhea, no hematochezia, no hematemesis, no melena             GU:                              no dysuria,  no frequency, no urinary tract infection, no hematuria, no enlarged prostate, no kidney stones, no kidney disease             Vascular:                     no pain suggestive of claudication, no pain in feet, no leg cramps, no varicose veins, no DVT, no non-healing foot ulcer             Neuro:                         +  remote h/o stroke, no TIA's, no seizures, + frequent headaches, no temporary blindness one eye,  no slurred speech, no peripheral neuropathy, no chronic pain, some instability of gait, some memory/cognitive dysfunction             Musculoskeletal:         no arthritis, no joint swelling, no myalgias, very mild difficulty walking, normal mobility              Skin:                            no rash, no itching, no skin infections, no pressure sores or ulcerations             Psych:                         no anxiety, no depression, no nervousness, no  unusual recent stress             Eyes:                           no blurry vision, no floaters, no recent vision changes, + wears glasses or contacts             ENT:                            no hearing loss, no loose or painful teeth, no dentures, last saw dentist many years ago             Hematologic:               no easy bruising, no abnormal bleeding, no clotting disorder, no frequent epistaxis             Endocrine:                   no diabetes, does not check CBG's at home                           Physical Exam:              BP (!) 131/86   Pulse 75   Resp 20 Comment: on RA  Ht 6' (1.829 m)   Wt 168 lb 12.8 oz (76.6 kg)   SpO2 92%   BMI 22.89 kg/m              General:                      Thin,  well-appearing             HEENT:                       Unremarkable              Neck:                           no JVD, no bruits, no adenopathy              Chest:                          clear to auscultation, symmetrical breath sounds, no wheezes, no  rhonchi              CV:                              RRR, grade III/VI holosystolic murmur              Abdomen:                    soft, non-tender, no masses              Extremities:                 warm, well-perfused, pulses palpable, no LE edema             Rectal/GU                   Deferred             Neuro:                         Grossly non-focal and symmetrical throughout             Skin:                            Clean and dry, no rashes, no breakdown   Diagnostic Tests:   ECHOCARDIOGRAM REPORT       Patient Name:  Alan [name] Date of Exam: 07/25/2019  Medical Rec #: [id number]       Height:    72.0 in  Accession #:  [id number]       Weight:    176.1 lb  Date of Birth: Dec 07, 1950       BSA:     2.019 m  Patient Age:  48 years        BP:      136/97 mmHg  Patient Gender: M           HR:      114 bpm.  Exam Location:  Inpatient   Procedure: 2D Echo, Cardiac Doppler and Color Doppler   Indications:  Chest pain    History:    Patient has prior history of Echocardiogram examinations,  most         recent 10/12/2018. CAD, Prior CABG, Aortic Valve Disease,  Mitral         Valve Disease and AVR, MV repai, Arrythmias:Atrial  Fibrillation         and Atrial Flutter; Risk Factors:Dyslipidemia. Pleural  effsuion.         Aortic Valve: 23 mm Edwards MagnaEase valve is present in  the         aortic position.         Mitral Valve: valve is present in the mitral position.    Sonographer:  Dustin Flock  Referring Phys: [id number] [place]    1. Left ventricular ejection fraction, by estimation, is 60 to 65%. The  left ventricle has normal function. The left ventricle has no regional  wall motion abnormalities. There is mild left ventricular hypertrophy.  Left ventricular diastolic function  could not be evaluated. Elevated left ventricular end-diastolic pressure.  2. Right ventricular systolic function is normal. The right ventricular  size is normal. There is moderately elevated pulmonary artery systolic  pressure. The estimated right ventricular systolic pressure is 52.8 mmHg.  3. Left atrial size was moderately dilated.  4. Right atrial size was mild to moderately dilated.  5. The mitral valve has been repaired/replaced. Mild to moderate mitral  valve regurgitation.  6. The tricuspid valve is abnormal. Tricuspid valve regurgitation is  moderate.  7. The aortic valve is tricuspid. Aortic valve regurgitation is not  visualized. There is a 23 mm Edwards MagnaEase valve present in the aortic  position.  8. The inferior vena cava is dilated in size with <50% respiratory  variability, suggesting right atrial pressure of 15 mmHg.   FINDINGS  Left Ventricle: Left ventricular ejection fraction, by  estimation, is 60  to 65%. The left ventricle has normal function. The left ventricle has no  regional wall motion abnormalities. The left ventricular internal cavity  size was normal in size. There is  mild left ventricular hypertrophy. Left ventricular diastolic function  could not be evaluated due to atrial fibrillation. Left ventricular  diastolic function could not be evaluated. Elevated left ventricular  end-diastolic pressure.   Right Ventricle: The right ventricular size is normal. No increase in  right ventricular wall thickness. Right ventricular systolic function is  normal. There is moderately elevated pulmonary artery systolic pressure.  The tricuspid regurgitant velocity is  3.27 m/s, and with an assumed right atrial pressure of 15 mmHg, the  estimated right ventricular systolic pressure is 62.2 mmHg.   Left Atrium: Left atrial size was moderately dilated.   Right Atrium: Right atrial size was mild to moderately dilated.   Pericardium: There is no evidence of pericardial effusion.   Mitral Valve: The mitral valve has been repaired/replaced. There is mild  thickening of the mitral valve leaflet(s). Mild to moderate mitral valve  regurgitation. There is a present in the mitral position. MV peak  gradient, 18.3 mmHg. The mean mitral valve  gradient is 7.0 mmHg.   Tricuspid Valve: The tricuspid valve is abnormal. Tricuspid valve  regurgitation is moderate.   Aortic Valve: The aortic valve is tricuspid. Aortic valve regurgitation is  not visualized. There is a 23 mm Edwards MagnaEase valve present in the  aortic position.   Pulmonic Valve: The pulmonic valve was grossly normal. Pulmonic valve  regurgitation is trivial.   Aorta: The aortic root and ascending aorta are structurally normal, with  no evidence of dilitation.   Venous: The inferior vena cava is dilated in size with less than 50%  respiratory variability, suggesting right atrial pressure of 15 mmHg.    IAS/Shunts: No atrial level shunt detected by color flow Doppler.   Additional Comments: There is a small pleural effusion in the left lateral  region.     LEFT VENTRICLE  PLAX 2D  LVIDd:     4.61 cm Diastology  LVIDs:     2.73 cm LV e' lateral:  11.90 cm/s  LV PW:     1.19 cm LV E/e' lateral: 14.7  LV IVS:    1.03 cm LV e' medial:  7.83 cm/s  LVOT diam:   2.00 cm LV E/e' medial: 22.3  LV SV:     33  LV SV Index:  16  LVOT Area:   3.14 cm     RIGHT VENTRICLE  RV Basal diam: 3.99 cm  RV S prime:   5.66 cm/s  TAPSE (M-mode): 2.2 cm   LEFT ATRIUM       Index    RIGHT ATRIUM      Index  LA diam:    4.80 cm 2.38 cm/m  RA Area:   25.80 cm  LA Vol (A2C):  90.8 ml 44.98 ml/m RA Volume:  75.70 ml 37.50 ml/m  LA Vol (A4C):  83.6 ml 41.41 ml/m  LA Biplane Vol: 92.2 ml 45.67 ml/m  AORTIC VALVE  LVOT Vmax:  82.60 cm/s  LVOT Vmean: 49.100 cm/s  LVOT VTI:  0.104 m    AORTA  Ao Root diam: 2.30 cm   MITRAL VALVE        TRICUSPID VALVE  MV Area (PHT): 4.71 cm   TR Peak grad:  42.8 mmHg  MV Peak grad: 18.3 mmHg  TR Vmax:    327.00 cm/s  MV Mean grad: 7.0 mmHg  MV Vmax:    2.14 m/s   SHUNTS  MV Vmean:   117.0 cm/s  Systemic VTI: 0.10 m  MV Decel Time: 161 msec   Systemic Diam: 2.00 cm  MV E velocity: 175.00 cm/s   Lyman Bishop MD  Electronically signed by Lyman Bishop MD  Signature Date/Time: 07/25/2019/2:50:10 PM        TRANSESOPHOGEAL ECHO REPORT       Patient Name:  Alan [name] Date of Exam: 07/28/2019  Medical Rec #: [id number]       Height:    72.0 in  Accession #:  [id number]       Weight:    169.5 lb  Date of Birth: 1951-02-08       BSA:     1.986 m  Patient Age:  69 years        BP:      106/71 mmHg  Patient Gender: M           HR:      113 bpm.  Exam Location: Inpatient    Procedure: Transesophageal Echo, Color Doppler and Cardiac Doppler   Indications:   Atrial Fibrillation    History:     Patient has prior history of Echocardiogram examinations,  most          recent 07/25/2019. CAD, Prior CABG, Mitral Valve Disease;          Arrythmias:Atrial Fibrillation.          Aortic Valve: 23 mm Edwards bioprosthetic valve is  present in          the aortic position. Procedure Date: 11/01/2014.          Mitral Valve: MV Repair with no ring valve is present in  the          mitral position. Procedure Date: 11/01/2014.    Sonographer:   Mikki Santee RDCS (AE)  Referring Phys: [place]  Diagnosing Phys: [name] MD   PROCEDURE: After discussion of the risks and benefits of a TEE, an  informed consent was obtained. The transesophogeal probe was passed  without difficulty through the esophogus of the patient. Sedation  performed by different physician. The patient was  monitored while under deep sedation. Anesthestetic sedation was provided  intravenously by Anesthesiology: 155.35mg  of Propofol. The patient  developed no complications during the procedure.   IMPRESSIONS    1. Normal LV function; s/p AVR with normal gradients and no AI; MV  leaflets do not coapt; severe MR; severe biatrial enlargement; LAA  previously clipped; no LA thrombus; mild RVE; mild RV dysfunction.  2. Left ventricular ejection fraction, by estimation, is 55 to 60%. The  left ventricle has normal function. The left ventricle has no regional  wall motion abnormalities.  3. Right ventricular systolic function  is mildly reduced. The right  ventricular size is mildly enlarged.  4. Left atrial size was severely dilated. No left atrial/left atrial  appendage thrombus was detected.  5. Right atrial size was severely dilated.  6. Moderate pleural effusion.  7. The mitral valve is abnormal. Severe  mitral valve regurgitation. There  is a MV Repair with no ring present in the mitral position. Procedure  Date: 11/01/2014.  8. Tricuspid valve regurgitation is moderate to severe.  9. The aortic valve has been repaired/replaced. Aortic valve  regurgitation is not visualized. There is a 23 mm Edwards bioprosthetic  valve present in the aortic position. Procedure Date: 11/01/2014.  10. There is mild (Grade II) plaque involving the descending aorta.   FINDINGS  Left Ventricle: Left ventricular ejection fraction, by estimation, is 55  to 60%. The left ventricle has normal function. The left ventricle has no  regional wall motion abnormalities. The left ventricular internal cavity  size was normal in size. There is  no left ventricular hypertrophy.   Right Ventricle: The right ventricular size is mildly enlarged. Right  vetricular wall thickness was not assessed. Right ventricular systolic  function is mildly reduced.   Left Atrium: Left atrial size was severely dilated. No left atrial/left  atrial appendage thrombus was detected.   Right Atrium: Right atrial size was severely dilated.   Pericardium: There is no evidence of pericardial effusion.   Mitral Valve: The mitral valve is abnormal. Severe mitral valve  regurgitation. There is a MV Repair with no ring present in the mitral  position. Procedure Date: 11/01/2014.   Tricuspid Valve: The tricuspid valve is normal in structure. Tricuspid  valve regurgitation is moderate to severe.   Aortic Valve: The aortic valve has been repaired/replaced. Aortic valve  regurgitation is not visualized. There is a 23 mm Edwards bioprosthetic  valve present in the aortic position. Procedure Date: 11/01/2014.   Pulmonic Valve: The pulmonic valve was not well visualized. Pulmonic valve  regurgitation is not visualized.   Aorta: The aortic root is normal in size and structure. There is mild  (Grade II) plaque involving the descending aorta.    IAS/Shunts: No atrial level shunt detected by color flow Doppler.   Additional Comments: Normal LV function; s/p AVR with normal gradients and  no AI; MV leaflets do not coapt; severe MR; severe biatrial enlargement;  LAA previously clipped; no LA thrombus; mild RVE; mild RV dysfunction.  There is a moderate pleural  effusion.     MR Peak grad:  59.9 mmHg  TRICUSPID VALVE  MR Mean grad:  40.0 mmHg  TR Peak grad:  37.5 mmHg  MR Vmax:     387.00 cm/s TR Vmax:    306.00 cm/s  MR Vmean:    306.0 cm/s  MR PISA:     5.09 cm  MR PISA Eff ROA: 41 mm  MR PISA Radius: 0.90 cm   [name] MD  Electronically signed by [name] MD  Signature Date/Time: 07/28/2019/12:05:21 PM      ECHOCARDIOGRAM LIMITED REPORT       Patient Name:  Alan [name] Date of Exam: 08/22/2019  Medical Rec #: [id number]       Height:    72.0 in  Accession #:  [id number]       Weight:    173.4 lb  Date of Birth: [birth year]       BSA:     2.005 m  Patient Age:  64 years  BP:      135/88 mmHg  Patient Gender: M           HR:      74 bpm.  Exam Location: [address]   Procedure: Limited Echo, Limited Color Doppler and Cardiac Doppler                MODIFIED REPORT:  This report was modified by Eleonore Chiquito MD on 08/22/2019 due to rvsp.  Indications:   I05.9 Mitral valve disorder    History:     Patient has prior history of Echocardiogram examinations,  most          recent 07/25/2019. Prior CABG; Arrythmias:Atrial  Fibrillation          and Atrial Flutter.          Aortic Valve: 23 mm [facility] Ease bovine pericardial          bioprosthetic tissue valve valve is present in the aortic          position. Procedure Date: 11/01/14.          Mitral Valve: valve is present in the mitral position.           Procedure Date: 11/01/2014.    Sonographer:   Marygrace Drought RCS  Referring Phys: [place]  Diagnosing Phys: Eleonore Chiquito MD   IMPRESSIONS    1. Left ventricular ejection fraction, by estimation, is 55 to 60%. The  left ventricle has normal function. Left ventricular diastolic function  could not be evaluated. There is abnormal (paradoxical) septal motion,  consistent with right ventricular  volume overload.  2. Right ventricular systolic function is mildly reduced. The right  ventricular size is severely enlarged. There is severely elevated  pulmonary artery systolic pressure. The estimated right ventricular  systolic pressure is 89.2 mmHg.  3. Left atrial size was moderately dilated.  4. Right atrial size was severely dilated.  5. History of MV repair (debridement of vegetation and no annuloplasty  ring per report). The MV leaflets appear thickened with restricted leaflet  motion in systole/diastole (IIIA). There is incomplete leaflet coaptation  with severe, centrally directed  MR. Suspect mixed etiology, primary leaflet problem (IIIA) with possible  atrial functional mechanism (I) due to annular dilation. Both leaflets  appear restricted and not limited to the PMVL. Possibly a more focused TEE  would better characterize pathology  and repairability. Severe mitral valve regurgitation. There is a present  in the mitral position. Procedure Date: 11/01/2014.  6. The tricuspid valve is abnormal. Tricuspid valve regurgitation is  severe.  7. 23 mm magna ease in AoV position. Vmax 1.21 m/s, MG 3 mmHG, EOA 1.87  cm2, DI 0.66. The aortic valve has been repaired/replaced. Aortic valve  regurgitation is not visualized. There is a 23 mm [facility] Ease  bovine pericardial bioprosthetic tissue  valve valve present in the aortic position. Procedure Date: 11/01/14. Echo  findings are consistent with normal structure and function of the aortic  valve prosthesis.   8. The inferior vena cava is dilated in size with <50% respiratory  variability, suggesting right atrial pressure of 15 mmHg.   Comparison(s): No significant change from prior study.   FINDINGS  Left Ventricle: Left ventricular ejection fraction, by estimation, is 55  to 60%. The left ventricle has normal function. The left ventricular  internal cavity size was normal in size. There is no left ventricular  hypertrophy. Abnormal (paradoxical)  septal motion, consistent with right ventricular volume overload.  Left  ventricular diastolic function could not be evaluated due to mitral  regurgitation (moderate or greater).   Right Ventricle: The right ventricular size is severely enlarged. No  increase in right ventricular wall thickness. Right ventricular systolic  function is mildly reduced. There is severely elevated pulmonary artery  systolic pressure. The tricuspid  regurgitant velocity is 3.79 m/s, and with an assumed right atrial  pressure of 15 mmHg, the estimated right ventricular systolic pressure is  76.2 mmHg.   Left Atrium: Left atrial size was moderately dilated.   Right Atrium: Right atrial size was severely dilated.   Pericardium: Trivial pericardial effusion is present.   Mitral Valve: History of MV repair (debridement of vegetation and no  annuloplasty ring per report). The MV leaflets appear thickened with  restricted leaflet motion in systole/diastole (IIIA). There is incomplete  leaflet coaptation with severe, centrally  directed MR. Suspect mixed etiology, primary leaflet problem (IIIA) with  possible atrial functional mechanism (I) due to annular dilation. Both  leaflets appear restricted and not limited to the PMVL. Possibly a more  focused TEE would better characterize  pathology and repairability. Severe mitral valve regurgitation. There is a  present in the mitral position. Procedure Date: 11/01/2014. MV peak  gradient, 22.8 mmHg. The mean mitral valve  gradient is 9.0 mmHg.   Tricuspid Valve: The tricuspid valve is abnormal. Tricuspid valve  regurgitation is severe. No evidence of tricuspid stenosis. The flow in  the hepatic veins is reversed during ventricular systole.   Aortic Valve: 23 mm magna ease in AoV position. Vmax 1.21 m/s, MG 3 mmHG,  EOA 1.87 cm2, DI 0.66. The aortic valve has been repaired/replaced. Aortic  valve regurgitation is not visualized. Aortic valve mean gradient measures  3.0 mmHg. Aortic valve peak  gradient measures 5.9 mmHg. Aortic valve area, by VTI measures 1.87 cm.  There is a 23 mm [facility] Ease bovine pericardial bioprosthetic  tissue valve valve present in the aortic position. Procedure Date:  11/01/14. Echo findings are consistent with  normal structure and function of the aortic valve prosthesis.   Pulmonic Valve: The pulmonic valve was grossly normal. Pulmonic valve  regurgitation is mild. No evidence of pulmonic stenosis.   Aorta: The aortic root and ascending aorta are structurally normal, with  no evidence of dilitation.   Venous: The inferior vena cava is dilated in size with less than 50%  respiratory variability, suggesting right atrial pressure of 15 mmHg.     LEFT VENTRICLE  PLAX 2D  LVIDd:     4.60 cm Diastology  LVIDs:     2.50 cm LV e' lateral:  8.92 cm/s  LV PW:     1.10 cm LV E/e' lateral: 17.9  LV IVS:    1.10 cm LV e' medial:  6.96 cm/s  LVOT diam:   1.90 cm LV E/e' medial: 23.0  LV SV:     37  LV SV Index:  19  LVOT Area:   2.84 cm     RIGHT VENTRICLE  RV Basal diam: 4.40 cm  RV S prime:   8.49 cm/s  TAPSE (M-mode): 1.4 cm  RVSP:      65.5 mmHg   LEFT ATRIUM       Index    RIGHT ATRIUM      Index  LA diam:    4.70 cm 2.34 cm/m RA Pressure: 8.00 mmHg  LA Vol (A2C):  70.1 ml 34.96 ml/m RA Area:   26.00 cm  LA  Vol (A4C):  92.5 ml 46.13 ml/m RA Volume:  88.90 ml 44.34 ml/m  LA Biplane Vol:  83.0 ml 41.39 ml/m  AORTIC VALVE  AV Area (Vmax):  1.92 cm  AV Area (Vmean):  2.12 cm  AV Area (VTI):   1.87 cm  AV Vmax:      121.00 cm/s  AV Vmean:     81.200 cm/s  AV VTI:      0.200 m  AV Peak Grad:   5.9 mmHg  AV Mean Grad:   3.0 mmHg  LVOT Vmax:     82.00 cm/s  LVOT Vmean:    60.800 cm/s  LVOT VTI:     0.132 m  LVOT/AV VTI ratio: 0.66    AORTA  Ao Root diam: 3.20 cm  Ao Asc diam: 3.60 cm   MITRAL VALVE         TRICUSPID VALVE  MV Area (PHT): 3.43 cm   TR Peak grad:  57.5 mmHg  MV Peak grad: 22.8 mmHg   TR Vmax:    379.00 cm/s  MV Mean grad: 9.0 mmHg   Estimated RAP: 8.00 mmHg  MV Vmax:    2.39 m/s   RVSP:      65.5 mmHg  MV Vmean:   127.0 cm/s  MV Decel Time: 221 msec   SHUNTS  MR Peak grad:  71.2 mmHg  Systemic VTI: 0.13 m  MR Mean grad:  46.0 mmHg  Systemic Diam: 1.90 cm  MR Vmax:     422.00 cm/s  MR Vmean:    314.0 cm/s  MR PISA:     4.02 cm  MR PISA Eff ROA: 29 mm  MR PISA Radius: 0.80 cm  MV E velocity: 160.00 cm/s  MV A velocity: 46.50 cm/s  MV E/A ratio: 3.44   Eleonore Chiquito MD  Electronically signed by Eleonore Chiquito MD  Signature Date/Time: 08/22/2019/5:33:19 PM     RIGHT/LEFT HEART CATH AND CORONARY ANGIOGRAPHY  Conclusion    Ost LAD lesion is 60% stenosed.  Prox LAD to Mid LAD lesion is 100% stenosed.  Previously placed Ost LPDA to LPDA drug eluting stent is widely patent.  Balloon angioplasty was performed.  Balloon angioplasty was performed.  Prox RCA to Mid RCA lesion is 80% stenosed.  Mid LAD lesion is 50% stenosed.  Ost 1st Mrg to 1st Mrg lesion is 40% stenosed.  Hemodynamic findings consistent with severe pulmonary hypertension.  1. 2 vessel obstructive CAD involving the LAD and nondominant RCA. Stents in the first OM and left PDA are patent 2. Patent LIMA to the LAD 3. Severe pulmonary HTN with mean 46 mm Hg 4.  High LV filling pressures with prominent V wave on PCWP tracing c/w severe MR.   Plan: CT surgery evaluation for MV repair/replacement.   Recommendations  Antiplatelet/Anticoag Recommend to resume Rivaroxaban, at currently prescribed dose and frequency on 09/14/2019. Concurrent antiplatelet therapy not recommended.  Indications  Mitral valve insufficiency, unspecified etiology [I34.0 (ICD-10-CM)]  Procedural Details  Technical Details Indication: 69 yo WM s/p AVR and MV repair and LIMA to the LAD. Subsequent stenting of first OM and left PDA in Feb 2020. Now presents with severe MR.  Procedural Details: The left wrist was prepped, draped, and anesthetized with 1% lidocaine. Using the modified Seldinger technique a 6 Fr slender sheath was placed in the left radial artery and a 7 French sheath was placed in the right brachial vein. A Swan-Ganz catheter was used for the right heart catheterization. Standard  protocol was followed for recording of right heart pressures and sampling of oxygen saturations. Fick cardiac output was calculated. Standard Judkins catheters were used for selective coronary and graft angiography. The RCA was engaged with a LA 0.75 catheter. There were no immediate procedural complications. The patient was transferred to the post catheterization recovery area for further monitoring.  Contrast: 70 cc  Estimated blood loss <50 mL.   During this procedure medications were administered to achieve and maintain moderate conscious sedation while the patient's heart rate, blood pressure, and oxygen saturation were continuously monitored and I was present face-to-face 100% of this time.  Medications (Filter: Administrations occurring from 1111 to 1221 on 09/13/19) Heparin (Porcine) in NaCl 1000-0.9 UT/500ML-% SOLN (mL) Total volume:  1,000 mL Date/Time  Rate/Dose/Volume Action  09/13/19 1121  500 mL Given  1121  500 mL Given    fentaNYL (SUBLIMAZE) injection  (mcg) Total dose:  25 mcg Date/Time  Rate/Dose/Volume Action  09/13/19 1132  25 mcg Given    midazolam (VERSED) injection (mg) Total dose:  1 mg Date/Time  Rate/Dose/Volume Action  09/13/19 1132  1 mg Given    lidocaine (PF) (XYLOCAINE) 1 % injection (mL) Total volume:  5 mL Date/Time  Rate/Dose/Volume Action  09/13/19 1150  5 mL Given    heparin sodium (porcine) injection (Units) Total dose:  4,000 Units Date/Time  Rate/Dose/Volume Action  09/13/19 1153  4,000 Units Given    Radial Cocktail/Verapamil only (mL) Total volume:  10 mL Date/Time  Rate/Dose/Volume Action  09/13/19 1150  10 mL Given    iohexol (OMNIPAQUE) 350 MG/ML injection (mL) Total volume:  70 mL Date/Time  Rate/Dose/Volume Action  09/13/19 1218  70 mL Given    Sedation Time  Sedation Time Physician-1: 40 minutes 49 seconds  Contrast  Medication Name Total Dose  iohexol (OMNIPAQUE) 350 MG/ML injection 70 mL    Radiation/Fluoro  Fluoro time: 9.2 (min) DAP: 18644 (mGycm2) Cumulative Air Kerma: 588 (mGy)  Complications  Complications documented before study signed (09/13/2019 50:27 PM)   No complications were associated with this study.  Documented by [place], Peter M, MD - 09/13/2019 12:23 PM    Coronary Findings  Diagnostic Dominance: Left Left Anterior Descending  Ost LAD lesion is 60% stenosed.  Prox LAD to Mid LAD lesion is 100% stenosed.  Mid LAD lesion is 50% stenosed.  Left Circumflex  First Obtuse Marginal Branch  Ost 1st Mrg to 1st Mrg lesion is 40% stenosed. The lesion was previously treated. Chronic total occlusion, vessel collateralized from the LAD as it fills from the LIMA graft.  Second Obtuse Marginal Branch  Left Posterior Descending Artery  Previously placed Ost LPDA to LPDA drug eluting stent is widely patent.  Right Coronary Artery  Vessel is small. Nondominant  Prox RCA to Mid RCA lesion is 80% stenosed.  LIMA Graft To Mid LAD  Intervention  No  interventions have been documented. Right Heart  Right Heart Pressures Hemodynamic findings consistent with severe pulmonary hypertension. Elevated LV filling pressures with large V wave on PCWP tracing.  Coronary Diagrams  Diagnostic Dominance: Left  Intervention  Implants      No implant documentation for this case.  Syngo Images  Show images for CARDIAC CATHETERIZATION Images on Long Term Storage  Show images for Shur, JAMERIUS BOECKMAN to Procedure Log  Procedure Log    Hemo Data   Most Recent Value  Fick Cardiac Output 4.81 L/min  Fick Cardiac Output Index 2.46 (L/min)/BSA  RA A Wave  16 mmHg  RA V Wave 18 mmHg  RA Mean 15 mmHg  RV Systolic Pressure 71 mmHg  RV Diastolic Pressure 10 mmHg  RV EDP 17 mmHg  PA Systolic Pressure 70 mmHg  PA Diastolic Pressure 30 mmHg  PA Mean 46 mmHg  PW A Wave 30 mmHg  PW V Wave 49 mmHg  PW Mean 32 mmHg  AO Systolic Pressure 034 mmHg  AO Diastolic Pressure 62 mmHg  AO Mean 78 mmHg  QP/QS 1  TPVR Index 18.69 HRUI  TSVR Index 31.69 HRUI  PVR SVR Ratio 0.22  TPVR/TSVR Ratio 0.59     EKG:   NSR w/out significant AV conduction delay (09/13/2019)     MRI HEAD WITHOUT AND WITH CONTRAST  TECHNIQUE: Multiplanar, multiecho pulse sequences of the brain and surrounding structures were obtained without and with intravenous contrast.  CONTRAST: 50mL GADAVIST GADOBUTROL 1 MMOL/ML IV SOLN  COMPARISON: 02/19/2017  FINDINGS: Brain: No acute infarct, mass, midline shift, or extra-axial fluid collection is identified. Scattered chronic cerebral and cerebellar microhemorrhages are unchanged. Small chronic infarcts in the left frontal lobe, both parietal lobes, both occipital lobes, and cerebellum are unchanged. Small T2 hyperintensities in the cerebral white matter bilaterally are also unchanged and nonspecific but compatible with mild chronic small vessel ischemic disease. Mild generalized cerebral  atrophy is within normal limits for age. No abnormal enhancement is identified.  Vascular: Major intracranial vascular flow voids are preserved.  Skull and upper cervical spine: No suspicious marrow lesion  Sinuses/Orbits: Bilateral cataract extraction. Subcentimeter mucous retention cyst in the left maxillary sinus. Trace left mastoid fluid.  Other: None.  IMPRESSION: 1. No acute intracranial abnormality or mass. 2. Chronic ischemia with multiple unchanged infarcts.   Electronically Signed By: Logan Bores M.D. On: 09/30/2019 08:52    CT ANGIOGRAPHY CHEST, ABDOMEN AND PELVIS  TECHNIQUE: Non-contrast CT of the chest was initially obtained.  Multidetector CT imaging through the chest, abdomen and pelvis was performed using the standard protocol during bolus administration of intravenous contrast. Multiplanar reconstructed images and MIPs were obtained and reviewed to evaluate the vascular anatomy.  CONTRAST: 136mL OMNIPAQUE IOHEXOL 350 MG/ML SOLN  COMPARISON: Prior CTA of the chest 07/25/2019  FINDINGS: CTA CHEST FINDINGS  Cardiovascular: Initial contrast enhanced images were performed in the pulmonary arterial phase. The main pulmonary artery is normal in size. No filling defect to suggest pulmonary embolus.  Imaging was then repeated to ensure appropriate opacification of the aorta. Three vessel aortic arch. Scattered atherosclerotic vascular calcifications. 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 The tubular portion of the ascending thoracic aorta is mildly ectatic at 3.8 cm but not aneurysmal. There is no evidence of thoracic aortic dissection. Cardiomegaly with marked right ventricular and right atrial dilation. Contrast material refluxes into the dilated intrahepatic IVC and hepatic veins. Calcifications are present along the coronary arteries. No pericardial effusion. Left atrial appendage ligation clip  noted.  Mediastinum/Nodes: Unremarkable CT appearance of the thyroid gland. No suspicious mediastinal or hilar adenopathy. No soft tissue mediastinal mass. The thoracic esophagus is unremarkable.  Lungs/Pleura: Moderate right and small left layering pleural effusions. Diffuse mild to moderate bronchial wall thickening. Respiratory motion limits evaluation for small pulmonary nodules. Mild interlobular septal thickening consistent with trace interstitial edema. Dependent atelectasis in the lower lobes.  Musculoskeletal: No acute fracture or aggressive appearing lytic or blastic osseous lesion. Healed median sternotomy.  Review of the MIP images confirms the above findings.  CTA ABDOMEN AND PELVIS FINDINGS  VASCULAR  Aorta: Normal caliber aorta without aneurysm, dissection, vasculitis or significant stenosis.  Celiac: Patent without evidence of aneurysm, dissection, vasculitis or significant stenosis.  SMA: Patent without evidence of aneurysm, dissection, vasculitis or significant stenosis.  Renals: Multiple renal arteries. On the right, there is a dominant renal artery with a small accessory artery supplying the lower pole. On the left, there are 3 small co-dominant renal arteries.  IMA: Patent without evidence of aneurysm, dissection, vasculitis or significant stenosis.  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 The maximal diameter is 1.5 cm. Otherwise, mild scattered atherosclerotic plaque without significant stenosis, dissection or other aneurysm.  Veins: No obvious venous abnormality within the limitations of this arterial phase study.  Review of the MIP images confirms the above findings.  NON-VASCULAR  Hepatobiliary: No focal liver abnormality is seen. No gallstones, gallbladder wall thickening, or biliary dilatation.  Pancreas:  Unremarkable. No pancreatic ductal dilatation or surrounding inflammatory changes.  Spleen: Normal in size without focal abnormality.  Adrenals/Urinary Tract: Normal adrenal glands. Renal size discrepancy with the right kidney larger than the left. Circumscribed 1.1 cm low-attenuation lesion in the interpolar right kidney is too small to characterize but statistically highly likely a benign cyst. The ureters and bladder are unremarkable.  Stomach/Bowel: Stomach is within normal limits. Appendix appears normal. No evidence of bowel wall thickening, distention, or inflammatory changes.  Lymphatic: No suspicious lymphadenopathy.  Reproductive: Surgical changes suggest prior prostatectomy.  Other: No abdominal wall hernia. Trace free fluid in the pelvic recess.  Musculoskeletal: No acute fracture or aggressive appearing lytic or blastic osseous lesion.  Review of the MIP images confirms the above findings.  IMPRESSION: 1. No evidence of thoracic or abdominal aortic dissection or other acute arterial abnormality. 2. CT findings suggest significantly elevated right heart pressures and hepatic venous congestion. Differential considerations include tricuspid regurgitation and right-sided congestive heart failure. 3. Moderate right and small left layering pleural effusions. 4. Cardiomegaly with right heart dilation, particularly the right atrium. 5. Surgical changes of aortic valve replacement without evidence of complication. 6. Mild interstitial pulmonary edema. 7. No specific infectious or inflammatory process identified in the abdomen. However, there is a small amount of free fluid layering in the pelvic cul-de-sac which is abnormal in a male patient. Given the clinical history of abdominal pain, this could herald some form of infectious or inflammatory process involving the bowel which is otherwise occult by imaging. However, given the findings of elevated right  heart pressures and passive venous congestion in the liver, a small amount of related simple ascites is also possible. 8. Aortic Atherosclerosis (ICD10-170.0). 9. 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 Maximal diameter 1.5 cm.   Electronically Signed By: Jacqulynn Cadet M.D. On: 09/30/2019 08:25   Impression:  Patient has severe symptomatic primary mitral regurgitation with recurrent atrial fibrillation and atrial flutter. He presents with progressive symptoms of exertional shortness of breath and fatigue with occasional resting shortness of breath, orthopnea, and PND consistent with chronic diastolic congestive heart failure, New [place] Heart Association functional class IIIb. Symptoms seem to be gradually getting worse despite the fact the patient has been maintaining sinus rhythm since recent DC cardioversion.  I have personally reviewed the patient's multiple transthoracic and transesophageal echocardiograms as well as his recent diagnostic cardiac catheterization. The patient has normal left ventricular systolic function with ejection fraction estimated 55 to 60% at the  time of most recent follow-up echocardiogram. 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 There is severe mitral regurgitation. 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 Moreover, the patient has worsening congestive heart failure with recent development of poor appetite, significant weight loss, and worsening generalized weakness.  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  CTA of the abdomen and pelvis reveals no contraindications to peripheral cannulation for surgery.   Plan:  The patientand his family wereagain counseled at length regarding the indications, risks and potential benefits of mitral valve replacement. The rationale for elective surgery has been explained, including a comparison between surgery and continued medical therapy with close follow-up. The likelihood of successful and durable mitral valve repair has been discussed with particular reference to the findings of their recent echocardiogram. Based upon these findings and previous experience, I feel that successful and durable valve repair is unlikely and we should plan on mitral valve replacement. Alternative surgical approaches have been discussed including a comparison between conventional sternotomy and minimally-invasive techniques. The relative risks and benefits of each have been reviewed as they pertain to the patient's specific circumstances, and expectations for the patient's postoperative convalescence has been discussed.   The possibility of need for conversion to conventional median sternotomy was discussed.  The relative risks and benefits of performing a maze procedure at the time ofhissurgery was discussed at length, including the expected likelihood of long term freedom from recurrent symptomatic atrial fibrillation and/or atrial flutter.The presence of secondary tricuspid regurgitation was discussed including the possibility that concomitant tricuspid valve repair might be necessary.Assumingthat the patient's valve cannot be successfully repaired, we discussed the possibility of replacing the mitral valve using a mechanical prosthesis with the attendant need for long-term anticoagulation versus the alternative of replacing it using a bioprosthetic  tissue valve with its potential for late structural valve deterioration and failure, depending upon the patient's longevity. The patient specifically requests that if the mitral valve must be replaced that it be done using a bioprostheticvalve.   We plan to proceed with surgery on Wednesday, October 05, 2019 as previously planned.    The patient understands and accepts all potential risks of surgery including but not limited to risk of death, stroke or other neurologic complication, myocardial infarction, congestive heart failure, respiratory failure, renal failure, bleeding requiring transfusion and/or reexploration, arrhythmia, infection or other wound complications, pneumonia, pleural and/or pericardial effusion, pulmonary embolus, aortic dissection or other major vascular complication, or delayed complications related to valve repair or replacement including but not limited to structural valve deterioration and failure, thrombosis, embolization, endocarditis, or paravalvular leak.  Specific risks potentially related to the minimally-invasive approach were discussed at length, including but not limited to risk of conversion to full or partial sternotomy, aortic dissection or other major vascular complication, unilateral acute lung injury or pulmonary edema, phrenic nerve dysfunction or paralysis, rib fracture, chronic pain, lung hernia, or lymphocele. All of their questions have been answered.    I spent in excess of 30 minutes during the conduct of this office consultation and >50% of this time involved direct face-to-face encounter with the patient for counseling  and/or coordination of their care.    [name]. Roxy Manns, MD 10/03/2019 4:29 PM

## 2019-10-03 NOTE — Progress Notes (Signed)
PCP - Faylene Million. Joya Gaskins, PA-C Cardiologist - Sanda Klein, MD  PPM/ICD - Denies  Chest x-ray - 10/03/19 EKG - 10/03/19 Stress Test - 10/14/18 ECHO - 08/22/19 Cardiac Cath - 09/13/19  Sleep Study - Yes, negative for OSA.  Patient denies being diabetic.  Blood Thinner Instructions: Stop Xarelto 7 days prior to sx. Per pt, Last dose of Xarelto 09/26/19. Aspirin Instructions: N/A  ERAS Protcol - N/A PRE-SURGERY Ensure or G2- N/A  COVID TEST- 10/03/19   Anesthesia review: Yes, cardiac hx.  Patient denies shortness of breath, fever, cough and chest pain at PAT appointment   All instructions explained to the patient, with a verbal understanding of the material. Patient agrees to go over the instructions while at home for a better understanding. Patient also instructed to self quarantine after being tested for COVID-19. The opportunity to ask questions was provided.

## 2019-10-04 ENCOUNTER — Ambulatory Visit (HOSPITAL_COMMUNITY): Payer: Medicare Other

## 2019-10-04 MED ORDER — PHENYLEPHRINE HCL-NACL 20-0.9 MG/250ML-% IV SOLN
30.0000 ug/min | INTRAVENOUS | Status: DC
  Filled 2019-10-04: qty 250

## 2019-10-04 MED ORDER — MILRINONE LACTATE IN DEXTROSE 20-5 MG/100ML-% IV SOLN
0.3000 ug/kg/min | INTRAVENOUS | Status: AC
  Administered 2019-10-05: .5 ug/kg/min via INTRAVENOUS
  Filled 2019-10-04: qty 100

## 2019-10-04 MED ORDER — POTASSIUM CHLORIDE 2 MEQ/ML IV SOLN
80.0000 meq | INTRAVENOUS | Status: DC
  Filled 2019-10-04: qty 40

## 2019-10-04 MED ORDER — NOREPINEPHRINE 4 MG/250ML-% IV SOLN
0.0000 ug/min | INTRAVENOUS | Status: AC
  Administered 2019-10-05: 2 ug/min via INTRAVENOUS
  Filled 2019-10-04: qty 250

## 2019-10-04 MED ORDER — TRANEXAMIC ACID 1000 MG/10ML IV SOLN
1.5000 mg/kg/h | INTRAVENOUS | Status: AC
  Administered 2019-10-05: 1.5 mg/kg/h via INTRAVENOUS
  Filled 2019-10-04: qty 25

## 2019-10-04 MED ORDER — VANCOMYCIN HCL 1000 MG IV SOLR
INTRAVENOUS | Status: DC
  Filled 2019-10-04: qty 1000

## 2019-10-04 MED ORDER — EPINEPHRINE HCL 5 MG/250ML IV SOLN IN NS
0.0000 ug/min | INTRAVENOUS | Status: DC
  Filled 2019-10-04: qty 250

## 2019-10-04 MED ORDER — PLASMA-LYTE 148 IV SOLN
INTRAVENOUS | Status: DC
  Filled 2019-10-04: qty 2.5

## 2019-10-04 MED ORDER — VANCOMYCIN HCL 1250 MG/250ML IV SOLN
1250.0000 mg | INTRAVENOUS | Status: AC
  Administered 2019-10-05: 1250 mg via INTRAVENOUS
  Filled 2019-10-04: qty 250

## 2019-10-04 MED ORDER — TRANEXAMIC ACID (OHS) BOLUS VIA INFUSION
15.0000 mg/kg | INTRAVENOUS | Status: AC
  Administered 2019-10-05: 1143 mg via INTRAVENOUS
  Filled 2019-10-04: qty 1143

## 2019-10-04 MED ORDER — TRANEXAMIC ACID (OHS) PUMP PRIME SOLUTION
2.0000 mg/kg | INTRAVENOUS | Status: DC
  Filled 2019-10-04: qty 1.52

## 2019-10-04 MED ORDER — NITROGLYCERIN IN D5W 200-5 MCG/ML-% IV SOLN
2.0000 ug/min | INTRAVENOUS | Status: AC
  Administered 2019-10-05: 10 ug/min via INTRAVENOUS
  Filled 2019-10-04: qty 250

## 2019-10-04 MED ORDER — MANNITOL 20 % IV SOLN
INTRAVENOUS | Status: DC
  Filled 2019-10-04: qty 13

## 2019-10-04 MED ORDER — SODIUM CHLORIDE 0.9 % IV SOLN
1.5000 g | INTRAVENOUS | Status: AC
  Administered 2019-10-05: 1.5 g via INTRAVENOUS
  Filled 2019-10-04 (×2): qty 1.5

## 2019-10-04 MED ORDER — INSULIN REGULAR(HUMAN) IN NACL 100-0.9 UT/100ML-% IV SOLN
INTRAVENOUS | Status: AC
  Administered 2019-10-05: 1 [IU]/h via INTRAVENOUS
  Filled 2019-10-04: qty 100

## 2019-10-04 MED ORDER — SODIUM CHLORIDE 0.9 % IV SOLN
750.0000 mg | INTRAVENOUS | Status: AC
  Administered 2019-10-05: 750 mg via INTRAVENOUS
  Filled 2019-10-04: qty 750

## 2019-10-04 MED ORDER — SODIUM CHLORIDE 0.9 % IV SOLN
INTRAVENOUS | Status: DC
  Filled 2019-10-04: qty 30

## 2019-10-04 MED ORDER — GLUTARALDEHYDE 0.625% SOAKING SOLUTION
TOPICAL | Status: DC
  Filled 2019-10-04: qty 50

## 2019-10-04 MED ORDER — DEXMEDETOMIDINE HCL IN NACL 400 MCG/100ML IV SOLN
0.1000 ug/kg/h | INTRAVENOUS | Status: AC
  Administered 2019-10-05: .2 ug/kg/h via INTRAVENOUS
  Filled 2019-10-04: qty 100

## 2019-10-04 NOTE — Progress Notes (Signed)
Thanks, Cub.

## 2019-10-05 ENCOUNTER — Inpatient Hospital Stay (HOSPITAL_COMMUNITY): Payer: Medicare Other | Admitting: Physician Assistant

## 2019-10-05 ENCOUNTER — Inpatient Hospital Stay (HOSPITAL_COMMUNITY): Payer: Medicare Other

## 2019-10-05 ENCOUNTER — Encounter (HOSPITAL_COMMUNITY): Payer: Self-pay | Admitting: Thoracic Surgery (Cardiothoracic Vascular Surgery)

## 2019-10-05 ENCOUNTER — Other Ambulatory Visit: Payer: Self-pay

## 2019-10-05 ENCOUNTER — Inpatient Hospital Stay (HOSPITAL_COMMUNITY): Payer: Medicare Other | Admitting: Vascular Surgery

## 2019-10-05 ENCOUNTER — Encounter (HOSPITAL_COMMUNITY)
Admission: RE | Disposition: A | Discharge status: Home / Self Care | Payer: Self-pay | Source: Home / Self Care | Attending: Thoracic Surgery (Cardiothoracic Vascular Surgery)

## 2019-10-05 ENCOUNTER — Inpatient Hospital Stay (HOSPITAL_COMMUNITY)
Admission: RE | Admit: 2019-10-05 | Discharge: 2019-10-13 | DRG: 219 | Disposition: A | Discharge status: Home / Self Care | Payer: Medicare Other | Attending: Thoracic Surgery (Cardiothoracic Vascular Surgery) | Admitting: Thoracic Surgery (Cardiothoracic Vascular Surgery)

## 2019-10-05 DIAGNOSIS — Z953 Presence of xenogenic heart valve: Secondary | ICD-10-CM

## 2019-10-05 DIAGNOSIS — Z955 Presence of coronary angioplasty implant and graft: Secondary | ICD-10-CM

## 2019-10-05 DIAGNOSIS — Z950 Presence of cardiac pacemaker: Secondary | ICD-10-CM

## 2019-10-05 DIAGNOSIS — I442 Atrioventricular block, complete: Secondary | ICD-10-CM | POA: Diagnosis present

## 2019-10-05 DIAGNOSIS — E43 Unspecified severe protein-calorie malnutrition: Secondary | ICD-10-CM | POA: Diagnosis present

## 2019-10-05 DIAGNOSIS — E785 Hyperlipidemia, unspecified: Secondary | ICD-10-CM | POA: Diagnosis present

## 2019-10-05 DIAGNOSIS — I4892 Unspecified atrial flutter: Secondary | ICD-10-CM

## 2019-10-05 DIAGNOSIS — I2582 Chronic total occlusion of coronary artery: Secondary | ICD-10-CM | POA: Diagnosis present

## 2019-10-05 DIAGNOSIS — I2510 Atherosclerotic heart disease of native coronary artery without angina pectoris: Secondary | ICD-10-CM | POA: Diagnosis present

## 2019-10-05 DIAGNOSIS — Z9109 Other allergy status, other than to drugs and biological substances: Secondary | ICD-10-CM

## 2019-10-05 DIAGNOSIS — Z8673 Personal history of transient ischemic attack (TIA), and cerebral infarction without residual deficits: Secondary | ICD-10-CM | POA: Diagnosis not present

## 2019-10-05 DIAGNOSIS — R0902 Hypoxemia: Secondary | ICD-10-CM

## 2019-10-05 DIAGNOSIS — Z8546 Personal history of malignant neoplasm of prostate: Secondary | ICD-10-CM | POA: Diagnosis not present

## 2019-10-05 DIAGNOSIS — R110 Nausea: Secondary | ICD-10-CM | POA: Diagnosis not present

## 2019-10-05 DIAGNOSIS — D62 Acute posthemorrhagic anemia: Secondary | ICD-10-CM | POA: Diagnosis not present

## 2019-10-05 DIAGNOSIS — I272 Pulmonary hypertension, unspecified: Secondary | ICD-10-CM | POA: Diagnosis present

## 2019-10-05 DIAGNOSIS — I480 Paroxysmal atrial fibrillation: Secondary | ICD-10-CM | POA: Diagnosis present

## 2019-10-05 DIAGNOSIS — I5033 Acute on chronic diastolic (congestive) heart failure: Secondary | ICD-10-CM | POA: Diagnosis present

## 2019-10-05 DIAGNOSIS — I361 Nonrheumatic tricuspid (valve) insufficiency: Secondary | ICD-10-CM

## 2019-10-05 DIAGNOSIS — Z9842 Cataract extraction status, left eye: Secondary | ICD-10-CM

## 2019-10-05 DIAGNOSIS — Z8679 Personal history of other diseases of the circulatory system: Secondary | ICD-10-CM

## 2019-10-05 DIAGNOSIS — Z951 Presence of aortocoronary bypass graft: Secondary | ICD-10-CM

## 2019-10-05 DIAGNOSIS — Z09 Encounter for follow-up examination after completed treatment for conditions other than malignant neoplasm: Secondary | ICD-10-CM

## 2019-10-05 DIAGNOSIS — I340 Nonrheumatic mitral (valve) insufficiency: Secondary | ICD-10-CM

## 2019-10-05 DIAGNOSIS — I110 Hypertensive heart disease with heart failure: Secondary | ICD-10-CM | POA: Diagnosis present

## 2019-10-05 DIAGNOSIS — I4819 Other persistent atrial fibrillation: Secondary | ICD-10-CM | POA: Diagnosis present

## 2019-10-05 DIAGNOSIS — Z6822 Body mass index (BMI) 22.0-22.9, adult: Secondary | ICD-10-CM | POA: Diagnosis not present

## 2019-10-05 DIAGNOSIS — D6959 Other secondary thrombocytopenia: Secondary | ICD-10-CM | POA: Diagnosis not present

## 2019-10-05 DIAGNOSIS — Z87891 Personal history of nicotine dependence: Secondary | ICD-10-CM

## 2019-10-05 DIAGNOSIS — G2581 Restless legs syndrome: Secondary | ICD-10-CM | POA: Diagnosis present

## 2019-10-05 DIAGNOSIS — Z952 Presence of prosthetic heart valve: Secondary | ICD-10-CM

## 2019-10-05 DIAGNOSIS — Z9841 Cataract extraction status, right eye: Secondary | ICD-10-CM

## 2019-10-05 DIAGNOSIS — Z79899 Other long term (current) drug therapy: Secondary | ICD-10-CM

## 2019-10-05 DIAGNOSIS — F339 Major depressive disorder, recurrent, unspecified: Secondary | ICD-10-CM | POA: Diagnosis present

## 2019-10-05 DIAGNOSIS — I083 Combined rheumatic disorders of mitral, aortic and tricuspid valves: Principal | ICD-10-CM | POA: Diagnosis present

## 2019-10-05 DIAGNOSIS — J9811 Atelectasis: Secondary | ICD-10-CM

## 2019-10-05 DIAGNOSIS — Z823 Family history of stroke: Secondary | ICD-10-CM | POA: Diagnosis not present

## 2019-10-05 DIAGNOSIS — Z20822 Contact with and (suspected) exposure to covid-19: Secondary | ICD-10-CM | POA: Diagnosis present

## 2019-10-05 DIAGNOSIS — Z9889 Other specified postprocedural states: Secondary | ICD-10-CM

## 2019-10-05 DIAGNOSIS — Z7901 Long term (current) use of anticoagulants: Secondary | ICD-10-CM

## 2019-10-05 DIAGNOSIS — I10 Essential (primary) hypertension: Secondary | ICD-10-CM | POA: Diagnosis present

## 2019-10-05 DIAGNOSIS — I071 Rheumatic tricuspid insufficiency: Secondary | ICD-10-CM

## 2019-10-05 HISTORY — DX: Other specified postprocedural states: Z98.890

## 2019-10-05 HISTORY — DX: Personal history of other diseases of the circulatory system: Z86.79

## 2019-10-05 HISTORY — DX: Presence of xenogenic heart valve: Z95.3

## 2019-10-05 HISTORY — PX: MAZE: SHX5063

## 2019-10-05 HISTORY — PX: MITRAL VALVE REPLACEMENT: SHX147

## 2019-10-05 HISTORY — PX: TEE WITHOUT CARDIOVERSION: SHX5443

## 2019-10-05 HISTORY — PX: TRICUSPID VALVE REPLACEMENT: SHX816

## 2019-10-05 LAB — POCT I-STAT, CHEM 8
BUN: 27 mg/dL — ABNORMAL HIGH (ref 8–23)
BUN: 22 mg/dL (ref 8–23)
BUN: 23 mg/dL (ref 8–23)
Calcium, Ion: 1.28 mmol/L (ref 1.15–1.40)
Calcium, Ion: 1.13 mmol/L — ABNORMAL LOW (ref 1.15–1.40)
Calcium, Ion: 1.13 mmol/L — ABNORMAL LOW (ref 1.15–1.40)
Chloride: 105 mmol/L (ref 98–111)
Chloride: 106 mmol/L (ref 98–111)
Chloride: 106 mmol/L (ref 98–111)
Creatinine, Ser: 1 mg/dL (ref 0.61–1.24)
Creatinine, Ser: 0.9 mg/dL (ref 0.61–1.24)
Creatinine, Ser: 0.9 mg/dL (ref 0.61–1.24)
Glucose, Bld: 95 mg/dL (ref 70–99)
Glucose, Bld: 99 mg/dL (ref 70–99)
Glucose, Bld: 91 mg/dL (ref 70–99)
HCT: 40 % (ref 39.0–52.0)
HCT: 28 % — ABNORMAL LOW (ref 39.0–52.0)
HCT: 26 % — ABNORMAL LOW (ref 39.0–52.0)
Hemoglobin: 13.6 g/dL (ref 13.0–17.0)
Hemoglobin: 9.5 g/dL — ABNORMAL LOW (ref 13.0–17.0)
Hemoglobin: 8.8 g/dL — ABNORMAL LOW (ref 13.0–17.0)
Potassium: 4.6 mmol/L (ref 3.5–5.1)
Potassium: 5.2 mmol/L — ABNORMAL HIGH (ref 3.5–5.1)
Potassium: 4.4 mmol/L (ref 3.5–5.1)
Sodium: 140 mmol/L (ref 135–145)
Sodium: 139 mmol/L (ref 135–145)
Sodium: 141 mmol/L (ref 135–145)
TCO2: 26 mmol/L (ref 22–32)
TCO2: 24 mmol/L (ref 22–32)
TCO2: 25 mmol/L (ref 22–32)

## 2019-10-05 LAB — POCT I-STAT 7, (LYTES, BLD GAS, ICA,H+H)
Acid-base deficit: 1 mmol/L (ref 0.0–2.0)
Acid-base deficit: 1 mmol/L (ref 0.0–2.0)
Bicarbonate: 25.3 mmol/L (ref 20.0–28.0)
Bicarbonate: 24.8 mmol/L (ref 20.0–28.0)
Calcium, Ion: 1.26 mmol/L (ref 1.15–1.40)
Calcium, Ion: 1.13 mmol/L — ABNORMAL LOW (ref 1.15–1.40)
HCT: 42 % (ref 39.0–52.0)
HCT: 26 % — ABNORMAL LOW (ref 39.0–52.0)
Hemoglobin: 14.3 g/dL (ref 13.0–17.0)
Hemoglobin: 8.8 g/dL — ABNORMAL LOW (ref 13.0–17.0)
O2 Saturation: 100 %
O2 Saturation: 100 %
Potassium: 4.6 mmol/L (ref 3.5–5.1)
Potassium: 4.5 mmol/L (ref 3.5–5.1)
Sodium: 139 mmol/L (ref 135–145)
Sodium: 141 mmol/L (ref 135–145)
TCO2: 27 mmol/L (ref 22–32)
TCO2: 26 mmol/L (ref 22–32)
pCO2 arterial: 47.9 mmHg (ref 32.0–48.0)
pCO2 arterial: 44.2 mmHg (ref 32.0–48.0)
pH, Arterial: 7.331 — ABNORMAL LOW (ref 7.350–7.450)
pH, Arterial: 7.358 (ref 7.350–7.450)
pO2, Arterial: 384 mmHg — ABNORMAL HIGH (ref 83.0–108.0)
pO2, Arterial: 300 mmHg — ABNORMAL HIGH (ref 83.0–108.0)

## 2019-10-05 LAB — PREPARE RBC (CROSSMATCH)

## 2019-10-05 LAB — ECHO INTRAOPERATIVE TEE
Calc EF: 66.1 %
MV M vel: 4.12 m/s
MV Peak grad: 67.9 mmHg
Radius: 1 cm
S' Lateral: 3 cm
Single Plane A2C EF: 68.2 %
Single Plane A4C EF: 65.6 %

## 2019-10-05 LAB — GLUCOSE, CAPILLARY
Glucose-Capillary: 88 mg/dL (ref 70–99)
Glucose-Capillary: 85 mg/dL (ref 70–99)
Glucose-Capillary: 75 mg/dL (ref 70–99)

## 2019-10-05 LAB — CBC
HCT: 37.4 % — ABNORMAL LOW (ref 39.0–52.0)
HCT: 34.6 % — ABNORMAL LOW (ref 39.0–52.0)
Hemoglobin: 11.8 g/dL — ABNORMAL LOW (ref 13.0–17.0)
Hemoglobin: 10.9 g/dL — ABNORMAL LOW (ref 13.0–17.0)
MCH: 27 pg (ref 26.0–34.0)
MCH: 26.4 pg (ref 26.0–34.0)
MCHC: 31.6 g/dL (ref 30.0–36.0)
MCHC: 31.5 g/dL (ref 30.0–36.0)
MCV: 85.6 fL (ref 80.0–100.0)
MCV: 83.8 fL (ref 80.0–100.0)
Platelets: 75 10*3/uL — ABNORMAL LOW (ref 150–400)
Platelets: 87 10*3/uL — ABNORMAL LOW (ref 150–400)
RBC: 4.37 MIL/uL (ref 4.22–5.81)
RBC: 4.13 MIL/uL — ABNORMAL LOW (ref 4.22–5.81)
RDW: 15.9 % — ABNORMAL HIGH (ref 11.5–15.5)
RDW: 15.8 % — ABNORMAL HIGH (ref 11.5–15.5)
WBC: 4.5 10*3/uL (ref 4.0–10.5)
WBC: 8.8 10*3/uL (ref 4.0–10.5)
nRBC: 0 % (ref 0.0–0.2)
nRBC: 0 % (ref 0.0–0.2)

## 2019-10-05 LAB — HEMOGLOBIN AND HEMATOCRIT, BLOOD
HCT: 28.6 % — ABNORMAL LOW (ref 39.0–52.0)
Hemoglobin: 8.9 g/dL — ABNORMAL LOW (ref 13.0–17.0)

## 2019-10-05 LAB — MAGNESIUM: Magnesium: 2.7 mg/dL — ABNORMAL HIGH (ref 1.7–2.4)

## 2019-10-05 LAB — BASIC METABOLIC PANEL
Anion gap: 10 (ref 5–15)
BUN: 18 mg/dL (ref 8–23)
CO2: 20 mmol/L — ABNORMAL LOW (ref 22–32)
Calcium: 8 mg/dL — ABNORMAL LOW (ref 8.9–10.3)
Chloride: 111 mmol/L (ref 98–111)
Creatinine, Ser: 1.02 mg/dL (ref 0.61–1.24)
GFR calc Af Amer: >60 mL/min (ref >60)
GFR calc non Af Amer: >60 mL/min (ref >60)
Glucose, Bld: 113 mg/dL — ABNORMAL HIGH (ref 70–99)
Potassium: 3.9 mmol/L (ref 3.5–5.1)
Sodium: 141 mmol/L (ref 135–145)

## 2019-10-05 LAB — PROTIME-INR
INR: 1.8 — ABNORMAL HIGH (ref 0.8–1.2)
Prothrombin Time: 20.1 seconds — ABNORMAL HIGH (ref 11.4–15.2)

## 2019-10-05 LAB — FIBRINOGEN: Fibrinogen: 184 mg/dL — ABNORMAL LOW (ref 210–475)

## 2019-10-05 LAB — PLATELET COUNT: Platelets: 95 10*3/uL — ABNORMAL LOW (ref 150–400)

## 2019-10-05 LAB — APTT: aPTT: 45 seconds — ABNORMAL HIGH (ref 24–36)

## 2019-10-05 SURGERY — REPLACEMENT, MITRAL VALVE
Anesthesia: General | Site: Chest

## 2019-10-05 MED ORDER — PROTAMINE SULFATE 10 MG/ML IV SOLN
INTRAVENOUS | Status: AC
  Filled 2019-10-05: qty 5

## 2019-10-05 MED ORDER — EPHEDRINE 5 MG/ML INJ
INTRAVENOUS | Status: AC
  Filled 2019-10-05: qty 10

## 2019-10-05 MED ORDER — ROCURONIUM BROMIDE 10 MG/ML (PF) SYRINGE
PREFILLED_SYRINGE | INTRAVENOUS | Status: AC
  Filled 2019-10-05: qty 10

## 2019-10-05 MED ORDER — LACTATED RINGERS IV SOLN: INTRAVENOUS | Status: DC | PRN

## 2019-10-05 MED ORDER — ONDANSETRON HCL 4 MG/2ML IJ SOLN
INTRAMUSCULAR | Status: AC
  Filled 2019-10-05: qty 2

## 2019-10-05 MED ORDER — MIDAZOLAM HCL 5 MG/5ML IJ SOLN
INTRAMUSCULAR | Status: DC | PRN
  Administered 2019-10-05: 1 mg via INTRAVENOUS
  Administered 2019-10-05: 6 mg via INTRAVENOUS
  Administered 2019-10-05 (×3): 1 mg via INTRAVENOUS

## 2019-10-05 MED ORDER — PHENYLEPHRINE HCL-NACL 20-0.9 MG/250ML-% IV SOLN
INTRAVENOUS | Status: DC | PRN
  Administered 2019-10-05: 15 ug/min via INTRAVENOUS

## 2019-10-05 MED ORDER — DEXAMETHASONE SODIUM PHOSPHATE 10 MG/ML IJ SOLN
INTRAMUSCULAR | Status: AC
  Filled 2019-10-05: qty 1

## 2019-10-05 MED ORDER — SODIUM CHLORIDE 0.9% IV SOLUTION: Freq: Once | INTRAVENOUS | Status: AC

## 2019-10-05 MED ORDER — ALBUMIN HUMAN 5 % IV SOLN: INTRAVENOUS | Status: DC | PRN

## 2019-10-05 MED ORDER — BUPIVACAINE LIPOSOME 1.3 % IJ SUSP: INTRAMUSCULAR | Status: DC | PRN

## 2019-10-05 MED ORDER — TRAMADOL HCL 50 MG PO TABS
50.0000 mg | ORAL_TABLET | ORAL | Status: DC | PRN
  Administered 2019-10-06 – 2019-10-07 (×8): 50 mg via ORAL
  Filled 2019-10-05 (×8): qty 1

## 2019-10-05 MED ORDER — ACETAMINOPHEN 160 MG/5ML PO SOLN: 650.0000 mg | Freq: Once | ORAL | Status: AC

## 2019-10-05 MED ORDER — SODIUM CHLORIDE 0.9 % IV SOLN: INTRAVENOUS | Status: DC

## 2019-10-05 MED ORDER — LACTATED RINGERS IV SOLN: INTRAVENOUS | Status: DC

## 2019-10-05 MED ORDER — MIDAZOLAM HCL (PF) 10 MG/2ML IJ SOLN
INTRAMUSCULAR | Status: AC
  Filled 2019-10-05: qty 2

## 2019-10-05 MED ORDER — MIDAZOLAM HCL 2 MG/2ML IJ SOLN: 2.0000 mg | INTRAMUSCULAR | Status: DC | PRN

## 2019-10-05 MED ORDER — DEXTROSE 50 % IV SOLN: 0.0000 mL | INTRAVENOUS | Status: DC | PRN

## 2019-10-05 MED ORDER — LIDOCAINE 2% (20 MG/ML) 5 ML SYRINGE
INTRAMUSCULAR | Status: AC
  Filled 2019-10-05: qty 5

## 2019-10-05 MED ORDER — ACETAMINOPHEN 500 MG PO TABS
1000.0000 mg | ORAL_TABLET | Freq: Four times a day (QID) | ORAL | Status: AC
  Administered 2019-10-06 – 2019-10-10 (×18): 1000 mg via ORAL
  Filled 2019-10-05 (×18): qty 2

## 2019-10-05 MED ORDER — HEPARIN SODIUM (PORCINE) 1000 UNIT/ML IJ SOLN
INTRAMUSCULAR | Status: DC | PRN
  Administered 2019-10-05: 27000 [IU] via INTRAVENOUS

## 2019-10-05 MED ORDER — VANCOMYCIN HCL 1000 MG IV SOLR
INTRAVENOUS | Status: DC | PRN
  Administered 2019-10-05: 1000 mL

## 2019-10-05 MED ORDER — INSULIN REGULAR(HUMAN) IN NACL 100-0.9 UT/100ML-% IV SOLN: INTRAVENOUS | Status: DC

## 2019-10-05 MED ORDER — POTASSIUM CHLORIDE 10 MEQ/50ML IV SOLN: 10.0000 meq | INTRAVENOUS | Status: AC

## 2019-10-05 MED ORDER — SODIUM CHLORIDE 0.9% FLUSH: 3.0000 mL | INTRAVENOUS | Status: DC | PRN

## 2019-10-05 MED ORDER — SODIUM CHLORIDE 0.9 % IV SOLN: 250.0000 mL | INTRAVENOUS | Status: DC

## 2019-10-05 MED ORDER — PROTAMINE SULFATE 10 MG/ML IV SOLN
INTRAVENOUS | Status: AC
  Filled 2019-10-05: qty 25

## 2019-10-05 MED ORDER — CHLORHEXIDINE GLUCONATE CLOTH 2 % EX PADS
6.0000 | MEDICATED_PAD | Freq: Every day | CUTANEOUS | Status: DC
  Administered 2019-10-05 – 2019-10-13 (×5): 6 via TOPICAL

## 2019-10-05 MED ORDER — EPHEDRINE SULFATE 50 MG/ML IJ SOLN
INTRAMUSCULAR | Status: DC | PRN
Start: 2019-10-05 — End: 2019-10-05
  Administered 2019-10-05 (×2): 5 mg via INTRAVENOUS
  Administered 2019-10-05: 10 mg via INTRAVENOUS
  Administered 2019-10-05 (×2): 5 mg via INTRAVENOUS

## 2019-10-05 MED ORDER — BISACODYL 5 MG PO TBEC
10.0000 mg | DELAYED_RELEASE_TABLET | Freq: Every day | ORAL | Status: DC
  Administered 2019-10-06 – 2019-10-11 (×4): 10 mg via ORAL
  Filled 2019-10-05 (×6): qty 2

## 2019-10-05 MED ORDER — ACETAMINOPHEN 650 MG RE SUPP
650.0000 mg | Freq: Once | RECTAL | Status: AC
  Administered 2019-10-05: 650 mg via RECTAL

## 2019-10-05 MED ORDER — FAMOTIDINE IN NACL 20-0.9 MG/50ML-% IV SOLN
20.0000 mg | Freq: Two times a day (BID) | INTRAVENOUS | Status: AC
  Administered 2019-10-05 (×2): 20 mg via INTRAVENOUS
  Filled 2019-10-05: qty 50

## 2019-10-05 MED ORDER — DOCUSATE SODIUM 100 MG PO CAPS
200.0000 mg | ORAL_CAPSULE | Freq: Every day | ORAL | Status: DC
  Administered 2019-10-06 – 2019-10-12 (×6): 200 mg via ORAL
  Filled 2019-10-05 (×7): qty 2

## 2019-10-05 MED ORDER — METOPROLOL TARTRATE 25 MG/10 ML ORAL SUSPENSION: 12.5000 mg | Freq: Two times a day (BID) | ORAL | Status: DC

## 2019-10-05 MED ORDER — NOREPINEPHRINE 4 MG/250ML-% IV SOLN
0.0000 ug/min | INTRAVENOUS | Status: DC
  Administered 2019-10-06: 10 ug/min via INTRAVENOUS
  Administered 2019-10-06: 6 ug/min via INTRAVENOUS
  Filled 2019-10-05 (×3): qty 250

## 2019-10-05 MED ORDER — INSULIN ASPART 100 UNIT/ML ~~LOC~~ SOLN
0.0000 [IU] | SUBCUTANEOUS | Status: DC
  Administered 2019-10-06: 2 [IU] via SUBCUTANEOUS

## 2019-10-05 MED ORDER — METOPROLOL TARTRATE 12.5 MG HALF TABLET
12.5000 mg | ORAL_TABLET | Freq: Once | ORAL | Status: AC
  Administered 2019-10-05: 12.5 mg via ORAL
  Filled 2019-10-05: qty 1

## 2019-10-05 MED ORDER — SODIUM CHLORIDE 0.45 % IV SOLN: INTRAVENOUS | Status: DC | PRN

## 2019-10-05 MED ORDER — GLYCOPYRROLATE PF 0.2 MG/ML IJ SOSY
PREFILLED_SYRINGE | INTRAMUSCULAR | Status: AC
  Filled 2019-10-05: qty 1

## 2019-10-05 MED ORDER — MAGNESIUM SULFATE 4 GM/100ML IV SOLN
4.0000 g | Freq: Once | INTRAVENOUS | Status: AC
  Administered 2019-10-05: 4 g via INTRAVENOUS
  Filled 2019-10-05: qty 100

## 2019-10-05 MED ORDER — SODIUM CHLORIDE 0.9% FLUSH
10.0000 mL | Freq: Two times a day (BID) | INTRAVENOUS | Status: DC
  Administered 2019-10-05: 10 mL
  Administered 2019-10-06: 20 mL
  Administered 2019-10-07: 10 mL

## 2019-10-05 MED ORDER — CHLORHEXIDINE GLUCONATE 0.12 % MT SOLN
15.0000 mL | OROMUCOSAL | Status: AC
  Administered 2019-10-05: 15 mL via OROMUCOSAL

## 2019-10-05 MED ORDER — FENTANYL CITRATE (PF) 250 MCG/5ML IJ SOLN
INTRAMUSCULAR | Status: AC
  Filled 2019-10-05: qty 5

## 2019-10-05 MED ORDER — CHLORHEXIDINE GLUCONATE 4 % EX LIQD: 30.0000 mL | CUTANEOUS | Status: DC

## 2019-10-05 MED ORDER — BUPIVACAINE HCL (PF) 0.5 % IJ SOLN
INTRAMUSCULAR | Status: AC
  Filled 2019-10-05: qty 30

## 2019-10-05 MED ORDER — CHLORHEXIDINE GLUCONATE 0.12% ORAL RINSE (MEDLINE KIT)
15.0000 mL | Freq: Two times a day (BID) | OROMUCOSAL | Status: DC
  Administered 2019-10-05 – 2019-10-06 (×2): 15 mL via OROMUCOSAL

## 2019-10-05 MED ORDER — CHLORHEXIDINE GLUCONATE 0.12 % MT SOLN: 15.0000 mL | Freq: Once | OROMUCOSAL | Status: AC

## 2019-10-05 MED ORDER — LACTATED RINGERS IV SOLN: 500.0000 mL | Freq: Once | INTRAVENOUS | Status: DC | PRN

## 2019-10-05 MED ORDER — VANCOMYCIN HCL IN DEXTROSE 1-5 GM/200ML-% IV SOLN
1000.0000 mg | Freq: Once | INTRAVENOUS | Status: AC
  Administered 2019-10-05: 1000 mg via INTRAVENOUS
  Filled 2019-10-05: qty 200

## 2019-10-05 MED ORDER — PHENYLEPHRINE HCL-NACL 20-0.9 MG/250ML-% IV SOLN: 0.0000 ug/min | INTRAVENOUS | Status: DC

## 2019-10-05 MED ORDER — DEXMEDETOMIDINE HCL IN NACL 400 MCG/100ML IV SOLN
0.0000 ug/kg/h | INTRAVENOUS | Status: DC
  Administered 2019-10-05: 0.7 ug/kg/h via INTRAVENOUS
  Filled 2019-10-05: qty 100

## 2019-10-05 MED ORDER — ALBUMIN HUMAN 5 % IV SOLN
250.0000 mL | INTRAVENOUS | Status: DC | PRN
  Administered 2019-10-05: 12.5 g via INTRAVENOUS

## 2019-10-05 MED ORDER — SODIUM CHLORIDE 0.9% FLUSH: 10.0000 mL | INTRAVENOUS | Status: DC | PRN

## 2019-10-05 MED ORDER — SODIUM CHLORIDE 0.9% FLUSH
3.0000 mL | Freq: Two times a day (BID) | INTRAVENOUS | Status: DC
  Administered 2019-10-06: 10 mL via INTRAVENOUS
  Administered 2019-10-06: 3 mL via INTRAVENOUS
  Administered 2019-10-07: 10 mL via INTRAVENOUS

## 2019-10-05 MED ORDER — SODIUM CHLORIDE 0.9 % IV SOLN
1.5000 g | Freq: Two times a day (BID) | INTRAVENOUS | Status: AC
  Administered 2019-10-05 – 2019-10-07 (×4): 1.5 g via INTRAVENOUS
  Filled 2019-10-05 (×4): qty 1.5

## 2019-10-05 MED ORDER — BISACODYL 10 MG RE SUPP: 10.0000 mg | Freq: Every day | RECTAL | Status: DC

## 2019-10-05 MED ORDER — NITROGLYCERIN IN D5W 200-5 MCG/ML-% IV SOLN: 0.0000 ug/min | INTRAVENOUS | Status: DC

## 2019-10-05 MED ORDER — CHLORHEXIDINE GLUCONATE 0.12 % MT SOLN
OROMUCOSAL | Status: AC
  Administered 2019-10-05: 15 mL via OROMUCOSAL
  Filled 2019-10-05: qty 15

## 2019-10-05 MED ORDER — SUCCINYLCHOLINE CHLORIDE 200 MG/10ML IV SOSY
PREFILLED_SYRINGE | INTRAVENOUS | Status: AC
  Filled 2019-10-05: qty 10

## 2019-10-05 MED ORDER — HEPARIN SODIUM (PORCINE) 1000 UNIT/ML IJ SOLN
INTRAMUSCULAR | Status: AC
  Filled 2019-10-05: qty 1

## 2019-10-05 MED ORDER — PROPOFOL 10 MG/ML IV BOLUS
INTRAVENOUS | Status: AC
  Filled 2019-10-05: qty 20

## 2019-10-05 MED ORDER — MILRINONE LACTATE IN DEXTROSE 20-5 MG/100ML-% IV SOLN
0.1250 ug/kg/min | INTRAVENOUS | Status: DC
  Administered 2019-10-05: 0.5 ug/kg/min via INTRAVENOUS
  Administered 2019-10-06: 0.375 ug/kg/min via INTRAVENOUS
  Administered 2019-10-07 – 2019-10-08 (×2): 0.25 ug/kg/min via INTRAVENOUS
  Administered 2019-10-10: 0.125 ug/kg/min via INTRAVENOUS
  Filled 2019-10-05 (×5): qty 100
  Filled 2019-10-05: qty 200
  Filled 2019-10-05: qty 100

## 2019-10-05 MED ORDER — 0.9 % SODIUM CHLORIDE (POUR BTL) OPTIME
TOPICAL | Status: DC | PRN
  Administered 2019-10-05: 5000 mL

## 2019-10-05 MED ORDER — CHLORHEXIDINE GLUCONATE CLOTH 2 % EX PADS
6.0000 | MEDICATED_PAD | Freq: Every day | CUTANEOUS | Status: DC
  Administered 2019-10-05 – 2019-10-12 (×6): 6 via TOPICAL

## 2019-10-05 MED ORDER — ACETAMINOPHEN 160 MG/5ML PO SOLN: 1000.0000 mg | Freq: Four times a day (QID) | ORAL | Status: DC

## 2019-10-05 MED ORDER — SODIUM CHLORIDE 0.9% IV SOLUTION: Freq: Once | INTRAVENOUS | Status: DC

## 2019-10-05 MED ORDER — PANTOPRAZOLE SODIUM 40 MG PO TBEC
40.0000 mg | DELAYED_RELEASE_TABLET | Freq: Every day | ORAL | Status: DC
  Administered 2019-10-07 – 2019-10-13 (×7): 40 mg via ORAL
  Filled 2019-10-05 (×7): qty 1

## 2019-10-05 MED ORDER — ORAL CARE MOUTH RINSE
15.0000 mL | OROMUCOSAL | Status: DC
  Administered 2019-10-05 – 2019-10-06 (×5): 15 mL via OROMUCOSAL

## 2019-10-05 MED ORDER — PHENYLEPHRINE 40 MCG/ML (10ML) SYRINGE FOR IV PUSH (FOR BLOOD PRESSURE SUPPORT)
PREFILLED_SYRINGE | INTRAVENOUS | Status: AC
  Filled 2019-10-05: qty 10

## 2019-10-05 MED ORDER — ASPIRIN 81 MG PO CHEW: 324.0000 mg | CHEWABLE_TABLET | Freq: Every day | ORAL | Status: DC

## 2019-10-05 MED ORDER — ASPIRIN EC 325 MG PO TBEC: 325.0000 mg | DELAYED_RELEASE_TABLET | Freq: Every day | ORAL | Status: DC

## 2019-10-05 MED ORDER — SODIUM CHLORIDE (PF) 0.9 % IJ SOLN
OROMUCOSAL | Status: DC | PRN
  Administered 2019-10-05 (×5): 4 mL via TOPICAL

## 2019-10-05 MED ORDER — SODIUM CHLORIDE (PF) 0.9 % IJ SOLN
INTRAMUSCULAR | Status: AC
  Filled 2019-10-05: qty 10

## 2019-10-05 MED ORDER — LIDOCAINE 2% (20 MG/ML) 5 ML SYRINGE
INTRAMUSCULAR | Status: DC | PRN
  Administered 2019-10-05: 40 mg via INTRAVENOUS

## 2019-10-05 MED ORDER — PROPOFOL 10 MG/ML IV BOLUS
INTRAVENOUS | Status: DC | PRN
  Administered 2019-10-05: 80 mg via INTRAVENOUS

## 2019-10-05 MED ORDER — OXYCODONE HCL 5 MG PO TABS
5.0000 mg | ORAL_TABLET | ORAL | Status: DC | PRN
  Administered 2019-10-07 – 2019-10-08 (×2): 5 mg via ORAL
  Administered 2019-10-09: 10 mg via ORAL
  Administered 2019-10-11 (×2): 5 mg via ORAL
  Filled 2019-10-05: qty 1
  Filled 2019-10-05: qty 2
  Filled 2019-10-05 (×3): qty 1

## 2019-10-05 MED ORDER — PHENYLEPHRINE HCL-NACL 10-0.9 MG/250ML-% IV SOLN
INTRAVENOUS | Status: DC | PRN
  Administered 2019-10-05: 10 ug/min via INTRAVENOUS

## 2019-10-05 MED ORDER — ROCURONIUM BROMIDE 10 MG/ML (PF) SYRINGE
PREFILLED_SYRINGE | INTRAVENOUS | Status: DC | PRN
  Administered 2019-10-05 (×2): 50 mg via INTRAVENOUS
  Administered 2019-10-05: 60 mg via INTRAVENOUS
  Administered 2019-10-05: 100 mg via INTRAVENOUS
  Administered 2019-10-05: 40 mg via INTRAVENOUS

## 2019-10-05 MED ORDER — METOPROLOL TARTRATE 5 MG/5ML IV SOLN: 2.5000 mg | INTRAVENOUS | Status: DC | PRN

## 2019-10-05 MED ORDER — ONDANSETRON HCL 4 MG/2ML IJ SOLN: 4.0000 mg | Freq: Four times a day (QID) | INTRAMUSCULAR | Status: DC | PRN

## 2019-10-05 MED ORDER — "THROMBI-PAD 3""X3"" EX PADS"
1.0000 | MEDICATED_PAD | Freq: Once | CUTANEOUS | Status: AC
  Administered 2019-10-05: 1 via TOPICAL
  Filled 2019-10-05: qty 1

## 2019-10-05 MED ORDER — MORPHINE SULFATE (PF) 2 MG/ML IV SOLN
1.0000 mg | INTRAVENOUS | Status: DC | PRN
  Administered 2019-10-05 – 2019-10-11 (×6): 2 mg via INTRAVENOUS
  Filled 2019-10-05 (×6): qty 1

## 2019-10-05 MED ORDER — PROTAMINE SULFATE 10 MG/ML IV SOLN
INTRAVENOUS | Status: DC | PRN
  Administered 2019-10-05 (×3): 50 mg via INTRAVENOUS
  Administered 2019-10-05: 30 mg via INTRAVENOUS
  Administered 2019-10-05: 40 mg via INTRAVENOUS
  Administered 2019-10-05: 50 mg via INTRAVENOUS

## 2019-10-05 MED ORDER — FENTANYL CITRATE (PF) 250 MCG/5ML IJ SOLN
INTRAMUSCULAR | Status: DC | PRN
  Administered 2019-10-05: 200 ug via INTRAVENOUS
  Administered 2019-10-05 (×3): 100 ug via INTRAVENOUS
  Administered 2019-10-05: 50 ug via INTRAVENOUS
  Administered 2019-10-05: 100 ug via INTRAVENOUS
  Administered 2019-10-05: 50 ug via INTRAVENOUS
  Administered 2019-10-05 (×4): 100 ug via INTRAVENOUS
  Administered 2019-10-05: 50 ug via INTRAVENOUS
  Administered 2019-10-05: 100 ug via INTRAVENOUS

## 2019-10-05 MED ORDER — PLASMA-LYTE 148 IV SOLN
INTRAVENOUS | Status: DC | PRN
  Administered 2019-10-05: 500 mL

## 2019-10-05 MED ORDER — SODIUM CHLORIDE 0.9% IV SOLUTION
Freq: Once | INTRAVENOUS | Status: AC
  Administered 2019-10-05: 400 mL/h via INTRAVENOUS

## 2019-10-05 MED ORDER — FENTANYL CITRATE (PF) 250 MCG/5ML IJ SOLN
INTRAMUSCULAR | Status: AC
  Filled 2019-10-05: qty 15

## 2019-10-05 MED ORDER — HEMOSTATIC AGENTS (NO CHARGE) OPTIME
TOPICAL | Status: DC | PRN
Start: 2019-10-05 — End: 2019-10-05
  Administered 2019-10-05 (×2): 1 via TOPICAL

## 2019-10-05 MED ORDER — METOPROLOL TARTRATE 12.5 MG HALF TABLET: 12.5000 mg | ORAL_TABLET | Freq: Two times a day (BID) | ORAL | Status: DC

## 2019-10-05 SURGICAL SUPPLY — 135 items
ADAPTER CARDIO PERF ANTE/RETRO (ADAPTER) ×6 IMPLANT
ADH SKN CLS APL DERMABOND .7 (GAUZE/BANDAGES/DRESSINGS) ×3
ADPR PRFSN 84XANTGRD RTRGD (ADAPTER) ×3
BAG DECANTER FOR FLEXI CONT (MISCELLANEOUS) ×8 IMPLANT
BLADE CLIPPER SURG (BLADE) ×6 IMPLANT
BLADE SURG 11 STRL SS (BLADE) ×6 IMPLANT
CANISTER SUCT 3000ML PPV (MISCELLANEOUS) ×10 IMPLANT
CANNULA ADULT BIO-MEDICUS 15FR (CANNULA) ×2 IMPLANT
CANNULA AORTIC ROOT 9FR (CANNULA) ×2 IMPLANT
CANNULA FEM VENOUS REMOTE 22FR (CANNULA) IMPLANT
CANNULA FEMORAL ART 14 SM (MISCELLANEOUS) ×6 IMPLANT
CANNULA GUNDRY RCSP 15FR (MISCELLANEOUS) ×6 IMPLANT
CANNULA OPTISITE PERFUSION 16F (CANNULA) IMPLANT
CANNULA OPTISITE PERFUSION 18F (CANNULA) ×2 IMPLANT
CANNULA SUMP PERICARDIAL (CANNULA) ×8 IMPLANT
CARDIOBLATE CARDIAC ABLATION (MISCELLANEOUS)
CATH CPB KIT OWEN (MISCELLANEOUS) IMPLANT
CATH ROBINSON RED A/P 18FR (CATHETERS) ×4 IMPLANT
CATH THORACIC 36FR (CATHETERS) ×2 IMPLANT
CELLS DAT CNTRL 66122 CELL SVR (MISCELLANEOUS) IMPLANT
CLAMP OLL ABLATION (MISCELLANEOUS) ×2 IMPLANT
CLIP FOGARTY SPRING 6M (CLIP) ×2 IMPLANT
CNTNR URN SCR LID CUP LEK RST (MISCELLANEOUS) ×3 IMPLANT
CONN ST 1/4X3/8  BEN (MISCELLANEOUS) ×8
CONN ST 1/4X3/8 BEN (MISCELLANEOUS) ×10 IMPLANT
CONNECTOR 1/2X3/8X1/2 3 WAY (MISCELLANEOUS) ×4
CONNECTOR 1/2X3/8X1/2 3WAY (MISCELLANEOUS) ×5 IMPLANT
CONT SPEC 4OZ STRL OR WHT (MISCELLANEOUS) ×4
COVER BACK TABLE 24X17X13 BIG (DRAPES) ×6 IMPLANT
COVER PROBE W GEL 5X96 (DRAPES) ×6 IMPLANT
DERMABOND ADVANCED (GAUZE/BANDAGES/DRESSINGS) ×1
DERMABOND ADVANCED .7 DNX12 (GAUZE/BANDAGES/DRESSINGS) ×9 IMPLANT
DEVICE CARDIOBLATE CARDIAC ABL (MISCELLANEOUS) IMPLANT
DEVICE CLOSURE PERCLS PRGLD 6F (VASCULAR PRODUCTS) ×20 IMPLANT
DEVICE SUT CK QUICK LOAD INDV (Prosthesis & Implant Heart) ×10 IMPLANT
DEVICE SUT CK QUICK LOAD MINI (Prosthesis & Implant Heart) ×6 IMPLANT
DEVICE TROCAR PUNCTURE CLOSURE (ENDOMECHANICALS) ×4 IMPLANT
DRAIN CHANNEL 32F RND 10.7 FF (WOUND CARE) ×14 IMPLANT
DRAPE C-ARM 42X72 X-RAY (DRAPES) ×4 IMPLANT
DRAPE CV SPLIT W-CLR ANES SCRN (DRAPES) ×6 IMPLANT
DRAPE INCISE IOBAN 66X45 STRL (DRAPES) ×12 IMPLANT
DRAPE PERI GROIN 82X75IN TIB (DRAPES) ×6 IMPLANT
DRAPE SLUSH/WARMER DISC (DRAPES) ×6 IMPLANT
DRSG AQUACEL AG ADV 3.5X14 (GAUZE/BANDAGES/DRESSINGS) ×2 IMPLANT
DRSG COVADERM 4X8 (GAUZE/BANDAGES/DRESSINGS) ×4 IMPLANT
ELECT BLADE 6.5 EXT (BLADE) ×6 IMPLANT
ELECT REM PT RETURN 9FT ADLT (ELECTROSURGICAL) ×8
ELECTRODE REM PT RTRN 9FT ADLT (ELECTROSURGICAL) ×10 IMPLANT
FELT TEFLON 1X6 (MISCELLANEOUS) ×4 IMPLANT
FEMORAL VENOUS CANN RAP (CANNULA) IMPLANT
FIBERTAPE STERNAL CLSR 2 36IN (SUTURE) ×8 IMPLANT
FIBERTAPE STERNAL CLSR 2X36 (SUTURE) ×8 IMPLANT
GAUZE SPONGE 4X4 12PLY STRL (GAUZE/BANDAGES/DRESSINGS) ×6 IMPLANT
GAUZE SPONGE 4X4 12PLY STRL LF (GAUZE/BANDAGES/DRESSINGS) ×4 IMPLANT
GLOVE BIO SURGEON STRL SZ 6.5 (GLOVE) ×4 IMPLANT
GLOVE BIO SURGEON STRL SZ7 (GLOVE) ×2 IMPLANT
GLOVE BIO SURGEON STRL SZ7.5 (GLOVE) ×4 IMPLANT
GLOVE BIOGEL PI IND STRL 6.5 (GLOVE) ×4 IMPLANT
GLOVE BIOGEL PI INDICATOR 6.5 (GLOVE) ×4
GLOVE ORTHO TXT STRL SZ7.5 (GLOVE) ×24 IMPLANT
GOWN STRL REUS W/ TWL LRG LVL3 (GOWN DISPOSABLE) ×23 IMPLANT
GOWN STRL REUS W/TWL LRG LVL3 (GOWN DISPOSABLE) ×28
INSERT FOGARTY SM (MISCELLANEOUS) ×2 IMPLANT
INSERT FOGARTY XLG (MISCELLANEOUS) ×2 IMPLANT
KIT BASIN OR (CUSTOM PROCEDURE TRAY) ×6 IMPLANT
KIT DEVICE SUT COR-KNOT MIS 5 (INSTRUMENTS) ×4 IMPLANT
KIT DILATOR VASC 18G NDL (KITS) ×6 IMPLANT
KIT DRAINAGE VACCUM ASSIST (KITS) ×2 IMPLANT
KIT SUCTION CATH 14FR (SUCTIONS) ×6 IMPLANT
KIT SUT CK MINI COMBO 4X17 (Prosthesis & Implant Heart) ×2 IMPLANT
KIT TURNOVER KIT B (KITS) ×6 IMPLANT
LEAD PACING MYOCARDI (MISCELLANEOUS) ×6 IMPLANT
LINE VENT (MISCELLANEOUS) ×2 IMPLANT
NDL AORTIC ROOT 14G 7F (CATHETERS) ×4 IMPLANT
NDL SUT 4 .5 CRC FRENCH EYE (NEEDLE) ×1 IMPLANT
NDL SUT PASSING CERCLAG MED (SUTURE) IMPLANT
NDL SUT PASSING CERCLAGE MED (SUTURE) ×4
NEEDLE AORTIC ROOT 14G 7F (CATHETERS) IMPLANT
NEEDLE FRENCH EYE (NEEDLE) ×4
NEEDLE SUT PASSING CERCLAG MED (SUTURE) ×3 IMPLANT
NS IRRIG 1000ML POUR BTL (IV SOLUTION) ×30 IMPLANT
PACK E MIN INVASIVE VALVE (SUTURE) ×6 IMPLANT
PACK OPEN HEART (CUSTOM PROCEDURE TRAY) ×6 IMPLANT
PAD ARMBOARD 7.5X6 YLW CONV (MISCELLANEOUS) ×12 IMPLANT
PAD ELECT DEFIB RADIOL ZOLL (MISCELLANEOUS) ×6 IMPLANT
PERCLOSE PROGLIDE 6F (VASCULAR PRODUCTS) ×16
POSITIONER HEAD DONUT 9IN (MISCELLANEOUS) ×6 IMPLANT
POWDER SURGICEL 3.0 GRAM (HEMOSTASIS) ×4 IMPLANT
PROBE CRYO2-ABLATION MALLABLE (MISCELLANEOUS) ×2 IMPLANT
RETRACTOR WND ALEXIS 18 MED (MISCELLANEOUS) ×2 IMPLANT
RING TRICUSPID T28 (Prosthesis & Implant Heart) ×2 IMPLANT
RTRCTR WOUND ALEXIS 18CM MED (MISCELLANEOUS)
SET CANNULATION TOURNIQUET (MISCELLANEOUS) ×6 IMPLANT
SET CARDIOPLEGIA MPS 5001102 (MISCELLANEOUS) ×2 IMPLANT
SET IRRIG TUBING LAPAROSCOPIC (IRRIGATION / IRRIGATOR) ×6 IMPLANT
SET MICROPUNCTURE 5F STIFF (MISCELLANEOUS) ×6 IMPLANT
SHEATH PINNACLE 8F 10CM (SHEATH) ×18 IMPLANT
SOL ANTI FOG 6CC (MISCELLANEOUS) ×5 IMPLANT
SOLUTION ANTI FOG 6CC (MISCELLANEOUS) ×1
SPONGE LAP 18X18 RF (DISPOSABLE) ×4 IMPLANT
SPONGE LAP 4X18 RFD (DISPOSABLE) ×2 IMPLANT
SUT BONE WAX W31G (SUTURE) ×6 IMPLANT
SUT ETHIBON 2 0 V 52N 30 (SUTURE) ×4 IMPLANT
SUT ETHIBOND (SUTURE) ×2 IMPLANT
SUT ETHIBOND 2 0 SH (SUTURE) ×8 IMPLANT
SUT ETHIBOND 2 0 SH 36X2 (SUTURE) ×1 IMPLANT
SUT ETHIBOND 2-0 RB-1 WHT (SUTURE) ×2 IMPLANT
SUT ETHIBOND X763 2 0 SH 1 (SUTURE) ×6 IMPLANT
SUT GORETEX CV 4 TH 22 36 (SUTURE) IMPLANT
SUT GORETEX CV4 TH-18 (SUTURE) IMPLANT
SUT PDS AB 1 CTX 36 (SUTURE) ×2 IMPLANT
SUT PROLENE 3 0 SH DA (SUTURE) ×4 IMPLANT
SUT PROLENE 3 0 SH1 36 (SUTURE) ×18 IMPLANT
SUT PROLENE 4 0 RB 1 (SUTURE) ×32
SUT PROLENE 4 0 SH DA (SUTURE) ×2 IMPLANT
SUT PROLENE 4-0 RB1 .5 CRCL 36 (SUTURE) ×12 IMPLANT
SUT PROLENE 6 0 C 1 30 (SUTURE) ×4 IMPLANT
SUT SILK  1 MH (SUTURE) ×4
SUT SILK 1 MH (SUTURE) ×1 IMPLANT
SUT VIC AB 1 CTX 36 (SUTURE) ×4
SUT VIC AB 1 CTX36XBRD ANBCTR (SUTURE) ×1 IMPLANT
SYSTEM SAHARA CHEST DRAIN ATS (WOUND CARE) ×10 IMPLANT
TAPE CLOTH SURG 4X10 WHT LF (GAUZE/BANDAGES/DRESSINGS) ×2 IMPLANT
TAPE PAPER 2X10 WHT MICROPORE (GAUZE/BANDAGES/DRESSINGS) ×2 IMPLANT
TOWEL GREEN STERILE (TOWEL DISPOSABLE) ×6 IMPLANT
TOWEL GREEN STERILE FF (TOWEL DISPOSABLE) ×6 IMPLANT
TRAY FOLEY SLVR 16FR TEMP STAT (SET/KITS/TRAYS/PACK) ×6 IMPLANT
TROCAR XCEL BLADELESS 5X75MML (TROCAR) ×4 IMPLANT
TROCAR XCEL NON-BLD 11X100MML (ENDOMECHANICALS) ×8 IMPLANT
TUBE SUCT INTRACARD DLP 20F (MISCELLANEOUS) ×6 IMPLANT
TUNNELER SHEATH ON-Q 11GX8 DSP (PAIN MANAGEMENT) IMPLANT
UNDERPAD 30X36 HEAVY ABSORB (UNDERPADS AND DIAPERS) ×8 IMPLANT
VALVE MITRAL SZ 29 (Prosthesis & Implant Heart) ×2 IMPLANT
WATER STERILE IRR 1000ML POUR (IV SOLUTION) ×12 IMPLANT
WIRE EMERALD 3MM-J .035X150CM (WIRE) ×6 IMPLANT

## 2019-10-05 NOTE — Interval H&P Note (Signed)
History and Physical Interval Note:  10/05/2019 6:31 AM  Alan [name] A [name]  has presented today for surgery, with the diagnosis of MR TR AFIB AFLUTTER.  The various methods of treatment have been discussed with the patient and family. After consideration of risks, benefits and other options for treatment, the patient has consented to  Procedure(s): MINIMALLY INVASIVE MITRAL VALVE (MV) REPLACEMENT (Right) possible MINIMALLY INVASIVE TRICUSPID VALVE REPAIR (Right) possible MINIMALLY INVASIVE MAZE PROCEDURE (N/A) TRANSESOPHAGEAL ECHOCARDIOGRAM (TEE) (N/A) as a surgical intervention.  The patient's history has been reviewed, patient examined, no change in status, stable for surgery.  I have reviewed the patient's chart and labs.  Questions were answered to the patient's satisfaction.     Rexene Alberts

## 2019-10-05 NOTE — Anesthesia Procedure Notes (Signed)
Central Venous Catheter Insertion Performed by: Roberts Gaudy, MD, anesthesiologist Start/End8/12/2019 7:50 AM, 10/05/2019 8:00 AM Patient location: Pre-op. Preanesthetic checklist: patient identified, IV checked, site marked, risks and benefits discussed, surgical consent, monitors and equipment checked, pre-op evaluation, timeout performed and anesthesia consent Lidocaine 1% used for infiltration and patient sedated Hand hygiene performed  and maximum sterile barriers used  Catheter size: 9 Fr Sheath introducer Procedure performed using ultrasound guided technique. Ultrasound Notes:anatomy identified, needle tip was noted to be adjacent to the nerve/plexus identified, no ultrasound evidence of intravascular and/or intraneural injection and image(s) printed for medical record Attempts: 1 Following insertion, line sutured and dressing applied. Post procedure assessment: blood return through all ports, free fluid flow and no air  Patient tolerated the procedure well with no immediate complications.

## 2019-10-05 NOTE — Anesthesia Procedure Notes (Addendum)
Arterial Line Insertion Start/End8/12/2019 7:40 AM, 10/05/2019 7:55 AM Performed by: Bobbe Quilter T, Immunologist, CRNA  Patient location: Pre-op. Preanesthetic checklist: patient identified, IV checked, site marked, risks and benefits discussed, surgical consent, monitors and equipment checked, pre-op evaluation and timeout performed Lidocaine 1% used for infiltration and patient sedated radial was placed Catheter size: 20 G Hand hygiene performed , maximum sterile barriers used  and Seldinger technique used Allen's test indicative of satisfactory collateral circulation Attempts: 2 Procedure performed without using ultrasound guided technique. Following insertion, dressing applied and Biopatch. Post procedure assessment: normal  Patient tolerated the procedure well with no immediate complications. Additional procedure comments: Placed by Hilda Blades .

## 2019-10-05 NOTE — Anesthesia Preprocedure Evaluation (Addendum)
Anesthesia Evaluation  Patient identified by MRN, date of birth, ID band Patient awake    Reviewed: Allergy & Precautions, NPO status , Patient's Chart, lab work & pertinent test results  Airway Mallampati: II  TM Distance: >3 FB Neck ROM: Full    Dental  (+) Teeth Intact, Dental Advisory Given   Pulmonary former smoker,    breath sounds clear to auscultation       Cardiovascular hypertension,  Rhythm:Regular Rate:Normal     Neuro/Psych    GI/Hepatic   Endo/Other    Renal/GU      Musculoskeletal   Abdominal   Peds  Hematology   Anesthesia Other Findings   Reproductive/Obstetrics                             Anesthesia Physical Anesthesia Plan  ASA: III  Anesthesia Plan: General   Post-op Pain Management:    Induction: Intravenous  PONV Risk Score and Plan: Ondansetron and Dexamethasone  Airway Management Planned: Double Lumen EBT  Additional Equipment: Arterial line, PA Cath, 3D TEE and Ultrasound Guidance Line Placement  Intra-op Plan:   Post-operative Plan: Possible Post-op intubation/ventilation  Informed Consent: I have reviewed the patients History and Physical, chart, labs and discussed the procedure including the risks, benefits and alternatives for the proposed anesthesia with the patient or authorized representative who has indicated his/her understanding and acceptance.     Dental advisory given  Plan Discussed with: CRNA and Anesthesiologist  Anesthesia Plan Comments:         Anesthesia Quick Evaluation

## 2019-10-05 NOTE — Brief Op Note (Signed)
10/05/2019  3:57 PM  PATIENT:  Lalo A Goldfarb  69 y.o. male  PRE-OPERATIVE DIAGNOSIS:  Mitral Regurgitation Tricuspid Regurgitation AFIB AFLUTTER  POST-OPERATIVE DIAGNOSIS:  Mitral Regurgitation Tricuspid Regurgitation  PROCEDURE:  Procedure(s): TRANSESOPHAGEAL ECHOCARDIOGRAM (TEE) (N/A) REDO STERNOTOMY (N/A) MITRAL VALVE (MV) REPLACEMENT USING MOSAIC 29MM VALVE (N/A) TRICUSPID VALVE REPAIR USING MC3 28MM ANNULOPLASTY RING (N/A) MAZE (N/A)  SURGEON:  Surgeon(s) and Role:    Rexene Alberts, MD - Primary  PHYSICIAN ASSISTANT: WAYNE GOLD PA-C  ASSISTANTS: STAFF   ANESTHESIA:   general  EBL:  401   BLOOD ADMINISTERED:FFP AND PLATELETS  DRAINS: MEDIASTINAL AND PLEURAL CHEST TUBES   LOCAL MEDICATIONS USED:  NONE  SPECIMEN:  Source of Specimen:  MITRAL LEAFLETS  DISPOSITION OF SPECIMEN:  PATHOLOGY  COUNTS:  YES  TOURNIQUET:  * No tourniquets in log *  DICTATION: .Dragon Dictation  PLAN OF CARE: Admit to inpatient   PATIENT DISPOSITION:  ICU - intubated and hemodynamically stable.   Delay start of Pharmacological VTE agent (>24hrs) due to surgical blood loss or risk of bleeding: yes  COMPLICATIONS: NO KNOWN

## 2019-10-05 NOTE — Anesthesia Procedure Notes (Signed)
Central Venous Catheter Insertion Performed by: Roberts Gaudy, MD, anesthesiologist Start/End8/12/2019 7:50 AM, 10/05/2019 8:00 AM Patient location: Pre-op. Preanesthetic checklist: patient identified, IV checked, site marked, risks and benefits discussed, surgical consent, monitors and equipment checked, pre-op evaluation, timeout performed and anesthesia consent Hand hygiene performed  and maximum sterile barriers used  PA cath was placed.Swan type:thermodilution Procedure performed without using ultrasound guided technique. Attempts: 1 Following insertion, line sutured, dressing applied and Biopatch. Patient tolerated the procedure well with no immediate complications.

## 2019-10-05 NOTE — Anesthesia Procedure Notes (Signed)
Procedure Name: Intubation Date/Time: 10/05/2019 9:04 AM Performed by: Cleopatra Sardo T, CRNA Pre-anesthesia Checklist: Patient identified, Emergency Drugs available, Suction available, Patient being monitored and Timeout performed Patient Re-evaluated:Patient Re-evaluated prior to induction Oxygen Delivery Method: Circle system utilized Preoxygenation: Pre-oxygenation with 100% oxygen Induction Type: IV induction Ventilation: Mask ventilation without difficulty Laryngoscope Size: Miller and 3 Grade View: Grade I Endobronchial tube: Double lumen EBT, EBT position confirmed by fiberoptic bronchoscope, EBT position confirmed by auscultation and Left and 37 Fr Number of attempts: 1 Airway Equipment and Method: Stylet and Fiberoptic brochoscope Placement Confirmation: ETT inserted through vocal cords under direct vision,  positive ETCO2,  CO2 detector and breath sounds checked- equal and bilateral Secured at: 27 cm Tube secured with: Tape Dental Injury: Teeth and Oropharynx as per pre-operative assessment  Comments: SRNA Loleta Books placed

## 2019-10-05 NOTE — Progress Notes (Signed)
Recruitment maneuver performed for 2 minutes due to continued decrease in sats. Sats responded well. Will increase FiO2 to 70% and continue to monitor.

## 2019-10-05 NOTE — Anesthesia Procedure Notes (Signed)
Procedure Name: Intubation Date/Time: 10/05/2019 4:10 PM Performed by: Zaryah Seckel T, CRNA Pre-anesthesia Checklist: Patient identified, Emergency Drugs available, Suction available and Patient being monitored Oxygen Delivery Method: Circle system utilized Preoxygenation: Pre-oxygenation with 100% oxygen Induction Type: Inhalational induction with existing ETT Laryngoscope Size: Glidescope and 3 Grade View: Grade I Tube type: Parker flex tip Tube size: 7.5 mm Number of attempts: 1 Airway Equipment and Method: Video-laryngoscopy Placement Confirmation: positive ETCO2 and breath sounds checked- equal and bilateral Secured at: 21 cm Tube secured with: Tape Dental Injury: Teeth and Oropharynx as per pre-operative assessment

## 2019-10-05 NOTE — Op Note (Signed)
CARDIOTHORACIC SURGERY OPERATIVE NOTE  Date of Procedure:  10/05/2019  Preoperative Diagnosis:   Severe Mitral Regurgitation  Moderate Tricuspid Regurgitation  Recurrent Persistent Atrial Fibrillation and Atrial Flutter  Postoperative Diagnosis:   Severe Mitral Regurgitation  Moderate-severe Tricuspid Regurgitation  Recurrent Persistent Atrial Fibrillation and Atrial Flutter   Procedure:    Mitral Valve Replacement   Redo median sternotomy  Medtronic Mosaic Stented Porcine Bioprosthetic Tissue Valve (size 53mm, model #310, serial #[id number])   Tricuspid Valve Repair   Edwards mc3 ring annuloplasty (size 68mm, model #4900, serial #[id number])   Maze Procedure   complete bilateral atrial lesion set using cryothermy and bipolar radiofrequency ablation     Surgeon: [name]. 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, restricted leaflet mobility and foreshortening of the subvalvular apparatus   Type IIIA mitral valve dysfunction causing severe mitral regurgitation  Normal left ventricular systolic function  Moderate right ventricular chamber enlargement with mild systolic dysfunction  Severe pulmonary hypertension  Moderate-severe tricuspid regurgitation                     BRIEF CLINICAL NOTE AND INDICATIONS FOR SURGERY  Patient is a 69 year old male with complex past medical history including 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, stroke, 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 status post aortic valve replacement with root enlargement using a stented bioprosthetic tissue valve, coronary artery disease status post coronary artery bypass grafting and more recently status post PCI and stenting of the posterior descending coronary artery, mitral regurgitation, chronic diastolic congestive heart  failure, recurrent atrial fibrillation and atrial flutter status post DC cardioversion on multiple occasions currently maintaining sinus rhythm on long-term anticoagulation using Xarelto, restless leg syndrome, and remote history of prostate cancer who has been referred for surgical consultation to discuss treatment options for management of severe symptomatic mitral regurgitation.  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 At the time he was incidentally found to have multivessel coronary artery disease. He underwent aortic valve replacement using a 23 mm Edwards MagnaEase stented bioprosthetic tissue valvewith bovine pericardial patch enlargement of the aortic root, mitral valve repair without ring annuloplasty, coronary artery bypass grafting using left internal mammary artery to the distal left anterior descending coronary artery,and clipping of the left atrial appendage. Intraoperative findings were notable for bulky vegetations on the aortic valve with significant leaflet destruction and severe aortic insufficiency but no annular abscess formation. The mitral valve was functioning normally although there was clearly pre-existing sclerosis and thickening of the anterior leaflet of the mitral valve.Mitral valve repair consisted only of debridement of the small vegetations adherent to the atrial surface of the anterior leaflet. Ring annuloplasty was not performed because the patient did not have significant mitral regurgitation. The patient's postoperative convalescence was notable for recurrent atrial flutter for which he underwent cardioversion. He otherwise did quite well and routine follow-up echocardiogram performed 3 months postoperatively revealed normal left ventricular systolic function, normal  functioning bioprosthetic tissue valve in the aortic position, mild mitral regurgitation, and mild mitral stenosis with mean transvalvular gradient across the mitral valve measured 5 mmHg.  Patient did very well from a cardiac standpoint until November 2019 when he began to develop progressive symptoms of exertional shortness  of breath. He was hospitalized acutely in February 2020 with chest pain and acute exacerbation of congestive heart failure in the setting of rapid atrial flutter. He was cardioverted back into sinus rhythm but TEE revealed severe mitral regurgitation. Catheterization revealed multivessel coronary artery disease with continued patency of left internal mammary artery to the distal left anterior descending coronary artery but further progression of severe stenosis involving posterior descending coronary artery arising off the left circumflex. This was treated with PCI and stenting using a drug-eluting stent.  Patient again did well until May of this year when he presented with heart failure exacerbation in the setting of recurrent atrial flutter with rapid ventricular response. He again underwent TEE and cardioversion. TEE revealed severe mitral regurgitation with preserved left ventricular function. The bioprosthetic tissue valve in the aortic position was functioning normally. The patient was seen in follow-up recently by Dr. Boykin Reaper which time he remained in sinus rhythm but he was complaining of worsening symptoms of congestive heart failure, [place] Heart Association functional class IIIb. Repeat echocardiogram confirmed the presence of persistent severe mitral regurgitation despite the fact the patient remained in sinus rhythm with normal left ventricular systolic function.Left and right heart catheterization was performed September 13, 2019 and revealed multivessel coronary artery disease with chronic occlusion of left anterior descending coronary artery but continued  patency of left internal mammary artery to the distal left anterior descending coronary artery. There remain patent stents in the first obtuse marginal and posterior descending branches of the left circumflex coronary artery.There was severe pulmonary hypertension with high filling pressures and large V waves on pulmonary capillary wedge tracing consistent with severe mitral regurgitation. Cardiothoracic surgical consultation was requested.  The patient has been seen in consultation and counseled at length regarding the indications, risks and potential benefits of surgery.  All questions have been answered, and the patient provides full informed consent for the operation as described.    DETAILS OF THE OPERATIVE PROCEDURE  Preparation:  The patient is brought to the operating room on the above mentioned date and central monitoring was established by the anesthesia team including placement of Swan-Ganz catheter and radial arterial line.  Initial pulmonary artery pressures are severely elevated and approach systemic pressure.  CVP is severely elevated as well and the patient cannot lay flat on the table comfortably, all consistent with acute exacerbation of chronic diastolic and right-sided congestive heart failure.  The patient is placed in the supine position on the operating table.  Intravenous antibiotics are administered. General endotracheal anesthesia is induced uneventfully using a dual-lumen endotracheal tube. A Foley catheter is placed.  Baseline transesophageal echocardiogram was performed.  Findings were notable for severe mitral regurgitation.  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  There is thickening and foreshortening of the subvalvular apparatus.  There is no mitral stenosis in the mitral annulus is dilated.  The left atrium is dilated.  There  is dramatic flow reversal in all 4 pulmonary veins.  The left atrial appendage has previously been obliterated and is no longer visible.  There is normal left ventricular systolic function although there is septal flattening and leftward displacement due to severe pulmonary hypertension.  The right ventricle is moderately dilated with mild right ventricular systolic dysfunction.  The tricuspid annulus is dilated greater than 4.6 cm.  There is moderate to severe central tricuspid regurgitation.  There is severe right atrial  enlargement.  The patient's chest, abdomen, both groins, and both lower extremities are prepared and draped in a sterile manner. A time out procedure is performed.   Percutaneous Vascular Access:  Percutaneous arterial and venous access were obtained on the right side.  Using ultrasound guidance the right common femoral vein was cannulated using the Seldinger technique and a pair of Perclose vascular closure devises were placed at opposing 30 degree angles, after which time an 8 French sheath inserted.  The right common femoral artery was cannulated using a micropuncture wire and sheath.  A pair of Perclose vascular closure devices were placed at opposing 30 degree angles in the femoral artery, and a 8 French sheath inserted.  The right internal jugular vein was cannulated  using ultrasound guidance and an 8 French sheath inserted.     Surgical Approach:  A decision is made to proceed with redo median sternotomy rather than attempt right minithoracotomy approach because of the patient's decompensated hemodynamics with severe pulmonary hypertension.  A redo median sternotomy incision was performed.  All sternal wires are removed.  The sternum was divided with an oscillating saw.  Sternal reentry is uneventful.  Careful dissection is performed to free up the underside of the sternum from the structures of the anterior mediastinum.  Dissection is continued until both the left and right  pleural spaces are widely open.    A retractor is placed.  Dissection is continued to free up the structures of the anterior mediastinum.  Dissection is tedious due to extensive adhesions from the patient's previous surgery.  Ultimately the distal ascending aorta, the pulmonary artery, and the inferior wall of the right ventricle are identified.  The medial surface of the left lung is dissected away from the mediastinum and the patent left internal mammary artery pedicle is identified.   Extracorporeal Cardiopulmonary Bypass and Myocardial Protection:  The patient was heparinized systemically.  The right common femoral vein is cannulated through the venous sheath and a guidewire advanced into the right atrium using TEE guidance.  The femoral vein cannulated using a 22 Fr long femoral venous cannula.  The right common femoral artery is cannulated through the arterial sheath and a guidewire advanced into the descending thoracic aorta using TEE guidance.  Femoral artery is cannulated with a 18 French femoral arterial cannula.  The right internal jugular vein is cannulated through the venous sheath and a guidewire advanced into the right atrium.  The internal jugular vein is cannulated using a 14 French pediatric femoral venous cannula.   Adequate heparinization is verified.   The entire pre-bypass portion of the operation was notable for stable hemodynamics.  Cardiopulmonary bypass was begun.  Dissection is continued to free up surfaces of the right atrium and the right ventricle.  Dissection is tedious due to previous surgery.  Ultimately the pericardial edges identified and dissection is continued until the interatrial groove is identified.  Dissection is continued around both the superior and the inferior vena cava, and umbilical tapes were placed around each.  The pulmonary arteries dissected away from the ascending aorta.  An antegrade cardioplegia cannula is placed directly in the ascending  aorta.  The patient is cooled to 32C systemic temperature.  A DeBakey clamp is utilized to occlude the patent left internal mammary artery pedicle.  The aortic cross clamp is applied and cardioplegia is delivered initially in an antegrade fashion through the aortic root using modified [place] cold blood cardioplegia ([place] blood cardioplegia protocol).   The initial cardioplegic  arrest is rapid with early diastolic arrest.  Repeat doses of cardioplegia are administered at 90 minutes and every 30 minutes thereafter through the aortic root in order to maintain completely flat electrocardiogram.  Myocardial protection was felt to be excellent.   Maze Procedure (left atrial lesion set):  Because the patient had previous cardiac surgery with clipping of the left atrial appendage, the left atrial lesion set of the Maze procedure was performed exclusively using cryotherapy along the endocardial surface.  A left atriotomy incision was performed through the interatrial groove and extended partially across the back wall of the left atrium after opening the oblique sinus inferiorly.  The floor of the left atrium and the mitral valve were exposed using a self-retaining retractor.  The mitral valve was inspected.    The Atricure CryoICE nitrous oxide cryothermy system is utilized for all cryothermy ablation lesions using 3 minute duration.  The AtriCure Synergy bipolar radiofrequency ablation clamp is utilized for all bipolar radiofrequency ablation lesions.  A cryothermy lesion is placed along the endocardial surface of the left atrium from the caudad apex of the atriotomy incision across the posterior wall of the left atrium onto the posterior mitral annulus.  A mirror image lesion along the epicardial surface is then performed with the probe posterior to the left atrium, crossing over the coronary sinus.  Two lesions are then performed to create a box isolating all of the pulmonary veins from the remainder  of the left atrium.  The first lesion is placed from the cephalad apex of the atriotomy incision across the dome of the left atrium to just anterior to the left sided pulmonary veins.  This lesion connects to the base of the left atrial appendage.  The second lesion completes the box from the caudad apex of the atriotomy incision across the back wall of the left atrium to connect with the previous lesion just anterior to the left sided pulmonary veins.  This completes the entire left side lesion set of the Cox maze procedure.   Mitral Valve Replacement:  The mitral valve was inspected and notable for classical rheumatic features with severe fibrosis involving both the anterior and posterior leaflets of the mitral valve.  There was no mitral stenosis.  Both leaflets were thickened and somewhat deformed.  There was moderate thickening and foreshortening of the subvalvular apparatus with some chordal fusion.  The anterior leaflet of the mitral valve was excised sharply, leaving a small rim of the free margin and the associated primary chords.  The posterior leaflet split in the midline.  The mitral annulus was sized to accept a 29 mm prosthesis.  The left ventricle was irrigated with copious cold saline solution.  Mitral valve replacement was performed using interrupted horizontal mattress 2-0 Ethibond pledgeted sutures with pledgets in the supraannular position.  The remaining portions of the anterior leaflet were incorporated into the suture line laterally, thereby preserving chords to both the anterior and posterior leaflet.  A Medtronic Mosaic stented porcine bioprosthetic tissue valve (size 29 mm, model # 310, serial # [id number]) was implanted uneventfully. The valve seated appropriately with care to position the commissure posts away from the left ventricular outflow tract.  All sutures were secured using a Cor-knot device.   Maze Procedure (right atrial lesion set):  The inferior vena cava cannula  was pulled down until the tip was just below the junction between the right atrium and the inferior vena cava and both caval tapes were secured.  An oblique right  atriotomy incision is performed.  The AtriCure Synergy bipolar radiofrequency ablation clamp is utilized to create a series of linear lesions in the right atrium.  The first lesion is performed from the posterolateral apex of the right atriotomy incision across the posterior right atrium in a cephalad direction onto the posterior superior vena cava.  This lesion is performed with one limb of the bipolar clamp along the endocardial surface of the right atrium and the other across the epicardial surface and into the left atrium to completely avoid the sinus node activation complex.  A second lesion is placed in the opposite direction from the posterior apex of the right atriotomy incision along the posterolateral wall to reach the lateral aspect of the inferior vena cava. A third lesion is placed from anterior apex of the right atriotomy incision in an oblique orientation towards the acute margin of the heart, with care to stay inferior and well away from the sinus node activation complex region.   A fourth bipolar lesion is placed from the anterior apex of this atriotomy incision in an anterior direction to the tip of the right atrial appendage.     The left atriotomy was closed using a 2-layer closure of running 3-0 Prolene suture after placing a sump drain across the mitral valve to serve as a left ventricular vent.    The cryotherapy probe is utilized to complete the right atrial lesion set by placing the probe along the endocardial surface of the right atrium from the anterior apex of the atriotomy incision to cross the acute margin and reach the tricuspid annulus at the 2:00 position.    Tricuspid Valve Repair:  Traction sutures are placed to facilitate exposure of the tricuspid valve. The tricuspid valve is inspected carefully. The tricuspid  valve leaflets appear normal with normal mobility and no sign of any fibrosis or thickening.   Tricuspid ring annuloplasty is performed using interrupted 2-0 Ethibond horizontal mattress sutures placed circumferentially around the tricuspid annulus with exception of the area immediately below the triangle of Koch.  The tricuspid valve was sized to accept a 28 mm annuloplasty ring based upon the overall surface area of the combined anterior and posterior leaflets.  An [facility] 3 annuloplasty ring (size 37mm, model #4900, serial [id number]) is implanted uneventfully. All sutures were secured using a Cor-knot device.  After completion of the annuloplasty the valve was tested with saline and appears to be competent.   A single dose of warm retrograde "reanimation dose" blood cardioplegia was given and clamp on the left internal mammary pedicle removed.   The aortic cross clamp removed after a total cross clamp duration of 119 minutes. T he right atriotomy incision is closed using a 2 layer closure of running 4-0 Prolene suture.   Procedure Completion:  Epicardial pacing wires are fixed to the right ventricular outflow tract and to the right atrial appendage. The patient is rewarmed to 37C temperature. The aortic and left ventricular vents are removed.  The patient is weaned and disconnected from cardiopulmonary bypass.  The patient's rhythm at separation from bypass was AV paced.  The patient was weaned from cardioplegic bypass on milrinone at 0.5 mcg/kg/min. Total cardiopulmonary bypass time for the operation was 185 minutes.  Followup transesophageal echocardiogram performed after separation from bypass revealed a well-seated mitral valve prosthesis that was functioning normally and without any sign of perivalvular leak.  Left ventricular function was unchanged from preoperatively.  There was an annuloplasty ring in the tricuspid position.  The tricuspid valve was functioning normally with no residual  leak.  Bioprosthetic tissue valve in the aortic position remain normal with no aortic insufficiency.  Right ventricular function appeared normal.  Pulmonary artery pressures were dramatically improved.  Protamine was administered to reverse the anticoagulation. The femoral arterial and venous cannulas were removed and all Perclose sutures secured.  Manual pressure was maintained while Protamine was administered.  The right internal jugular cannula was removed and manual pressure held on the neck and groin for 15 minutes.   The mediastinum and pleural space were inspected for hemostasis and irrigated with saline solution.  The patient received a total of 1 pack adult platelets, 2 units fresh frozen plasma, and 1 pack cryoprecipitate due to coagulopathy and thrombocytopenia after separation from cardiopulmonary bypass and reversal of heparin with protamine.   The mediastinum and both pleural spaces were drained using 4 chest tubes placed through separate stab incisions inferiorly.  The soft tissues anterior to the aorta were reapproximated loosely. The sternum is closed with Fibertape cerclage. The soft tissues anterior to the sternum were closed in multiple layers and the skin is closed with a running subcuticular skin closure.  The post-bypass portion of the operation was notable for stable rhythm and hemodynamics.    Patient Disposition:  The patient tolerated the procedure well and is transported to the surgical intensive care in stable condition. There are no intraoperative complications. All sponge instrument and needle counts are verified correct at completion of the operation.     [name]. Roxy Manns MD 10/05/2019 4:02 PM

## 2019-10-05 NOTE — Progress Notes (Signed)
Patient ID: Alan [name], male   DOB: Apr 28, 1950, 69 y.o.   MRN: [id number] EVENING ROUNDS NOTE :     [address]       [place] [address]             [phone]                 Day of Surgery Procedure(s) (LRB): TRANSESOPHAGEAL ECHOCARDIOGRAM (TEE) (N/A) REDO STERNOTOMY (N/A) MITRAL VALVE (MV) REPLACEMENT USING MOSAIC 29MM VALVE (N/A) TRICUSPID VALVE REPAIR USING MC3 28MM ANNULOPLASTY RING (N/A) MAZE (N/A)  Total Length of Stay:  LOS: 0 days  BP (!) 137/93   Pulse 90   Temp 98.6 F (37 C) (Oral)   Resp 12   Ht 6' (1.829 m)   SpO2 100%   BMI 22.78 kg/m   .Intake/Output      08/10 0701 - 08/11 0700 08/11 0701 - 08/12 0700   I.V.  3500   Blood  2370   IV Piggyback  600   Total Intake  6470   Urine  1300   Blood  1203   Total Output  2503   Net  +3967          . sodium chloride    . [START ON 10/06/2019] sodium chloride    . sodium chloride    . sodium chloride    . albumin human    . cefUROXime (ZINACEF)  IV    . dexmedetomidine (PRECEDEX) IV infusion    . famotidine (PEPCID) IV    . insulin    . lactated ringers    . lactated ringers    . lactated ringers    . magnesium sulfate    . milrinone    . nitroGLYCERIN    . norepinephrine (LEVOPHED) Adult infusion    . phenylephrine (NEO-SYNEPHRINE) Adult infusion    . potassium chloride    . vancomycin       Lab Results  Component Value Date   WBC 6.5 10/03/2019   HGB 8.9 (L) 10/05/2019   HCT 28.6 (L) 10/05/2019   PLT 95 (L) 10/05/2019   GLUCOSE 133 (H) 10/03/2019   CHOL 154 03/06/2016   TRIG 71 03/06/2016   HDL 45 03/06/2016   LDLCALC 95 03/06/2016   ALT 22 10/03/2019   AST 24 10/03/2019   NA 139 10/03/2019   K 4.0 10/03/2019   CL 105 10/03/2019   CREATININE 1.19 10/03/2019   BUN 20 10/03/2019   CO2 23 10/03/2019   TSH 1.708 10/18/2014   INR 1.3 (H) 10/03/2019   HGBA1C 6.0 (H) 10/03/2019   Early postop- on vent 100 ml total in both pleurovac currently - given  cry /blood products in or Pacer dependent po2 [place] MD  Beeper [phone] Office [phone] 10/05/2019 4:48 PM

## 2019-10-05 NOTE — Transfer of Care (Signed)
Immediate Anesthesia Transfer of Care Note  Patient: Alan [name]  Procedure(s) Performed: TRANSESOPHAGEAL ECHOCARDIOGRAM (TEE) (N/A ) REDO STERNOTOMY (N/A Chest) MITRAL VALVE (MV) REPLACEMENT USING MOSAIC 29MM VALVE (N/A Chest) TRICUSPID VALVE REPAIR USING MC3 28MM ANNULOPLASTY RING (N/A Chest) MAZE (N/A Chest)  Patient Location: ICU  Anesthesia Type:General  Level of Consciousness: Patient remains intubated per anesthesia plan  Airway & Oxygen Therapy: Patient remains intubated per anesthesia plan and Patient placed on Ventilator (see vital sign flow sheet for setting)  Post-op Assessment: Report given to RN and Post -op Vital signs reviewed and stable  Post vital signs: Reviewed and stable  Last Vitals:  Vitals Value Taken Time  BP    Temp 35.6 C 10/05/19 1642  Pulse 83 10/05/19 1642  Resp 12 10/05/19 1642  SpO2 99 % 10/05/19 1642  Vitals shown include unvalidated device data.  Last Pain:  Vitals:   10/05/19 0708  TempSrc: Oral  PainSc:       Patients Stated Pain Goal: 3 (31/43/88 8757)  Complications: No complications documented.

## 2019-10-06 ENCOUNTER — Inpatient Hospital Stay (HOSPITAL_COMMUNITY): Payer: Medicare Other

## 2019-10-06 ENCOUNTER — Encounter (HOSPITAL_COMMUNITY): Payer: Self-pay | Admitting: Thoracic Surgery (Cardiothoracic Vascular Surgery)

## 2019-10-06 LAB — POCT I-STAT 7, (LYTES, BLD GAS, ICA,H+H)
Acid-Base Excess: 0 mmol/L (ref 0.0–2.0)
Acid-Base Excess: 1 mmol/L (ref 0.0–2.0)
Acid-base deficit: 1 mmol/L (ref 0.0–2.0)
Acid-base deficit: 3 mmol/L — ABNORMAL HIGH (ref 0.0–2.0)
Acid-base deficit: 4 mmol/L — ABNORMAL HIGH (ref 0.0–2.0)
Acid-base deficit: 3 mmol/L — ABNORMAL HIGH (ref 0.0–2.0)
Acid-base deficit: 5 mmol/L — ABNORMAL HIGH (ref 0.0–2.0)
Acid-base deficit: 4 mmol/L — ABNORMAL HIGH (ref 0.0–2.0)
Bicarbonate: 24.6 mmol/L (ref 20.0–28.0)
Bicarbonate: 25.1 mmol/L (ref 20.0–28.0)
Bicarbonate: 25.7 mmol/L (ref 20.0–28.0)
Bicarbonate: 22.8 mmol/L (ref 20.0–28.0)
Bicarbonate: 20.3 mmol/L (ref 20.0–28.0)
Bicarbonate: 20.4 mmol/L (ref 20.0–28.0)
Bicarbonate: 20.5 mmol/L (ref 20.0–28.0)
Bicarbonate: 20.9 mmol/L (ref 20.0–28.0)
Calcium, Ion: 1.1 mmol/L — ABNORMAL LOW (ref 1.15–1.40)
Calcium, Ion: 1.13 mmol/L — ABNORMAL LOW (ref 1.15–1.40)
Calcium, Ion: 1.13 mmol/L — ABNORMAL LOW (ref 1.15–1.40)
Calcium, Ion: 1.05 mmol/L — ABNORMAL LOW (ref 1.15–1.40)
Calcium, Ion: 1.08 mmol/L — ABNORMAL LOW (ref 1.15–1.40)
Calcium, Ion: 1.14 mmol/L — ABNORMAL LOW (ref 1.15–1.40)
Calcium, Ion: 1.18 mmol/L (ref 1.15–1.40)
Calcium, Ion: 1.17 mmol/L (ref 1.15–1.40)
HCT: 32 % — ABNORMAL LOW (ref 39.0–52.0)
HCT: 27 % — ABNORMAL LOW (ref 39.0–52.0)
HCT: 27 % — ABNORMAL LOW (ref 39.0–52.0)
HCT: 35 % — ABNORMAL LOW (ref 39.0–52.0)
HCT: 33 % — ABNORMAL LOW (ref 39.0–52.0)
HCT: 31 % — ABNORMAL LOW (ref 39.0–52.0)
HCT: 32 % — ABNORMAL LOW (ref 39.0–52.0)
HCT: 32 % — ABNORMAL LOW (ref 39.0–52.0)
Hemoglobin: 10.9 g/dL — ABNORMAL LOW (ref 13.0–17.0)
Hemoglobin: 9.2 g/dL — ABNORMAL LOW (ref 13.0–17.0)
Hemoglobin: 9.2 g/dL — ABNORMAL LOW (ref 13.0–17.0)
Hemoglobin: 11.9 g/dL — ABNORMAL LOW (ref 13.0–17.0)
Hemoglobin: 11.2 g/dL — ABNORMAL LOW (ref 13.0–17.0)
Hemoglobin: 10.5 g/dL — ABNORMAL LOW (ref 13.0–17.0)
Hemoglobin: 10.9 g/dL — ABNORMAL LOW (ref 13.0–17.0)
Hemoglobin: 10.9 g/dL — ABNORMAL LOW (ref 13.0–17.0)
O2 Saturation: 100 %
O2 Saturation: 100 %
O2 Saturation: 100 %
O2 Saturation: 90 %
O2 Saturation: 93 %
O2 Saturation: 96 %
O2 Saturation: 92 %
O2 Saturation: 95 %
Patient temperature: 35.7
Patient temperature: 36.4
Patient temperature: 36.7
Patient temperature: 36.5
Patient temperature: 36.3
Potassium: 4.6 mmol/L (ref 3.5–5.1)
Potassium: 4.9 mmol/L (ref 3.5–5.1)
Potassium: 5 mmol/L (ref 3.5–5.1)
Potassium: 4.1 mmol/L (ref 3.5–5.1)
Potassium: 4 mmol/L (ref 3.5–5.1)
Potassium: 3.3 mmol/L — ABNORMAL LOW (ref 3.5–5.1)
Potassium: 3.6 mmol/L (ref 3.5–5.1)
Potassium: 4 mmol/L (ref 3.5–5.1)
Sodium: 141 mmol/L (ref 135–145)
Sodium: 139 mmol/L (ref 135–145)
Sodium: 139 mmol/L (ref 135–145)
Sodium: 143 mmol/L (ref 135–145)
Sodium: 143 mmol/L (ref 135–145)
Sodium: 143 mmol/L (ref 135–145)
Sodium: 143 mmol/L (ref 135–145)
Sodium: 142 mmol/L (ref 135–145)
TCO2: 26 mmol/L (ref 22–32)
TCO2: 26 mmol/L (ref 22–32)
TCO2: 27 mmol/L (ref 22–32)
TCO2: 24 mmol/L (ref 22–32)
TCO2: 21 mmol/L — ABNORMAL LOW (ref 22–32)
TCO2: 21 mmol/L — ABNORMAL LOW (ref 22–32)
TCO2: 22 mmol/L (ref 22–32)
TCO2: 22 mmol/L (ref 22–32)
pCO2 arterial: 42.2 mmHg (ref 32.0–48.0)
pCO2 arterial: 40.2 mmHg (ref 32.0–48.0)
pCO2 arterial: 38.6 mmHg (ref 32.0–48.0)
pCO2 arterial: 39.1 mmHg (ref 32.0–48.0)
pCO2 arterial: 32.4 mmHg (ref 32.0–48.0)
pCO2 arterial: 29.4 mmHg — ABNORMAL LOW (ref 32.0–48.0)
pCO2 arterial: 36.3 mmHg (ref 32.0–48.0)
pCO2 arterial: 37 mmHg (ref 32.0–48.0)
pH, Arterial: 7.373 (ref 7.350–7.450)
pH, Arterial: 7.404 (ref 7.350–7.450)
pH, Arterial: 7.432 (ref 7.350–7.450)
pH, Arterial: 7.368 (ref 7.350–7.450)
pH, Arterial: 7.403 (ref 7.350–7.450)
pH, Arterial: 7.447 (ref 7.350–7.450)
pH, Arterial: 7.357 (ref 7.350–7.450)
pH, Arterial: 7.357 (ref 7.350–7.450)
pO2, Arterial: 389 mmHg — ABNORMAL HIGH (ref 83.0–108.0)
pO2, Arterial: 395 mmHg — ABNORMAL HIGH (ref 83.0–108.0)
pO2, Arterial: 349 mmHg — ABNORMAL HIGH (ref 83.0–108.0)
pO2, Arterial: 56 mmHg — ABNORMAL LOW (ref 83.0–108.0)
pO2, Arterial: 64 mmHg — ABNORMAL LOW (ref 83.0–108.0)
pO2, Arterial: 78 mmHg — ABNORMAL LOW (ref 83.0–108.0)
pO2, Arterial: 65 mmHg — ABNORMAL LOW (ref 83.0–108.0)
pO2, Arterial: 74 mmHg — ABNORMAL LOW (ref 83.0–108.0)

## 2019-10-06 LAB — PREPARE FRESH FROZEN PLASMA: Unit division: 0

## 2019-10-06 LAB — PREPARE PLATELET PHERESIS: Unit division: 0

## 2019-10-06 LAB — POCT I-STAT, CHEM 8
BUN: 23 mg/dL (ref 8–23)
BUN: 20 mg/dL (ref 8–23)
BUN: 21 mg/dL (ref 8–23)
Calcium, Ion: 1.24 mmol/L (ref 1.15–1.40)
Calcium, Ion: 1.12 mmol/L — ABNORMAL LOW (ref 1.15–1.40)
Calcium, Ion: 1.13 mmol/L — ABNORMAL LOW (ref 1.15–1.40)
Chloride: 107 mmol/L (ref 98–111)
Chloride: 103 mmol/L (ref 98–111)
Chloride: 105 mmol/L (ref 98–111)
Creatinine, Ser: 1 mg/dL (ref 0.61–1.24)
Creatinine, Ser: 0.9 mg/dL (ref 0.61–1.24)
Creatinine, Ser: 0.9 mg/dL (ref 0.61–1.24)
Glucose, Bld: 90 mg/dL (ref 70–99)
Glucose, Bld: 87 mg/dL (ref 70–99)
Glucose, Bld: 100 mg/dL — ABNORMAL HIGH (ref 70–99)
HCT: 38 % — ABNORMAL LOW (ref 39.0–52.0)
HCT: 28 % — ABNORMAL LOW (ref 39.0–52.0)
HCT: 27 % — ABNORMAL LOW (ref 39.0–52.0)
Hemoglobin: 12.9 g/dL — ABNORMAL LOW (ref 13.0–17.0)
Hemoglobin: 9.5 g/dL — ABNORMAL LOW (ref 13.0–17.0)
Hemoglobin: 9.2 g/dL — ABNORMAL LOW (ref 13.0–17.0)
Potassium: 4.4 mmol/L (ref 3.5–5.1)
Potassium: 4.4 mmol/L (ref 3.5–5.1)
Potassium: 4.9 mmol/L (ref 3.5–5.1)
Sodium: 140 mmol/L (ref 135–145)
Sodium: 139 mmol/L (ref 135–145)
Sodium: 138 mmol/L (ref 135–145)
TCO2: 22 mmol/L (ref 22–32)
TCO2: 23 mmol/L (ref 22–32)
TCO2: 25 mmol/L (ref 22–32)

## 2019-10-06 LAB — SURGICAL PATHOLOGY

## 2019-10-06 LAB — BPAM FFP
Blood Product Expiration Date: 202108162359
Blood Product Expiration Date: 202108162359
Blood Product Expiration Date: 202108162359
Blood Product Expiration Date: 202108162359
ISSUE DATE / TIME: 202108111420
ISSUE DATE / TIME: 202108111420
ISSUE DATE / TIME: 202108111541
ISSUE DATE / TIME: 202108111541
Unit Type and Rh: 7300
Unit Type and Rh: 7300
Unit Type and Rh: 8400
Unit Type and Rh: 8400

## 2019-10-06 LAB — GLUCOSE, CAPILLARY
Glucose-Capillary: 106 mg/dL — ABNORMAL HIGH (ref 70–99)
Glucose-Capillary: 107 mg/dL — ABNORMAL HIGH (ref 70–99)
Glucose-Capillary: 113 mg/dL — ABNORMAL HIGH (ref 70–99)
Glucose-Capillary: 121 mg/dL — ABNORMAL HIGH (ref 70–99)
Glucose-Capillary: 133 mg/dL — ABNORMAL HIGH (ref 70–99)
Glucose-Capillary: 140 mg/dL — ABNORMAL HIGH (ref 70–99)
Glucose-Capillary: 168 mg/dL — ABNORMAL HIGH (ref 70–99)

## 2019-10-06 LAB — BASIC METABOLIC PANEL
Anion gap: 9 (ref 5–15)
Anion gap: 6 (ref 5–15)
BUN: 16 mg/dL (ref 8–23)
BUN: 17 mg/dL (ref 8–23)
CO2: 20 mmol/L — ABNORMAL LOW (ref 22–32)
CO2: 20 mmol/L — ABNORMAL LOW (ref 22–32)
Calcium: 8 mg/dL — ABNORMAL LOW (ref 8.9–10.3)
Calcium: 8 mg/dL — ABNORMAL LOW (ref 8.9–10.3)
Chloride: 110 mmol/L (ref 98–111)
Chloride: 106 mmol/L (ref 98–111)
Creatinine, Ser: 1.18 mg/dL (ref 0.61–1.24)
Creatinine, Ser: 1.28 mg/dL — ABNORMAL HIGH (ref 0.61–1.24)
GFR calc Af Amer: >60 mL/min (ref >60)
GFR calc Af Amer: >60 mL/min (ref >60)
GFR calc non Af Amer: >60 mL/min (ref >60)
GFR calc non Af Amer: 56 mL/min — ABNORMAL LOW (ref >60)
Glucose, Bld: 117 mg/dL — ABNORMAL HIGH (ref 70–99)
Glucose, Bld: 177 mg/dL — ABNORMAL HIGH (ref 70–99)
Potassium: 3.5 mmol/L (ref 3.5–5.1)
Potassium: 5 mmol/L (ref 3.5–5.1)
Sodium: 139 mmol/L (ref 135–145)
Sodium: 132 mmol/L — ABNORMAL LOW (ref 135–145)

## 2019-10-06 LAB — PREPARE CRYOPRECIPITATE
Unit division: 0
Unit division: 0

## 2019-10-06 LAB — BPAM PLATELET PHERESIS
Blood Product Expiration Date: 202108122359
ISSUE DATE / TIME: 202108111400
Unit Type and Rh: 5100

## 2019-10-06 LAB — BPAM CRYOPRECIPITATE
Blood Product Expiration Date: 202108112140
Blood Product Expiration Date: 202108112140
ISSUE DATE / TIME: 202108111552
ISSUE DATE / TIME: 202108111552
Unit Type and Rh: 5100
Unit Type and Rh: 5100

## 2019-10-06 LAB — MAGNESIUM
Magnesium: 2.4 mg/dL (ref 1.7–2.4)
Magnesium: 2.3 mg/dL (ref 1.7–2.4)

## 2019-10-06 LAB — COOXEMETRY PANEL
Carboxyhemoglobin: 0.8 % (ref 0.5–1.5)
Methemoglobin: 0.7 % (ref 0.0–1.5)
O2 Saturation: 62.5 %
Total hemoglobin: 12.6 g/dL (ref 12.0–16.0)

## 2019-10-06 LAB — CBC
HCT: 32.6 % — ABNORMAL LOW (ref 39.0–52.0)
HCT: 32 % — ABNORMAL LOW (ref 39.0–52.0)
Hemoglobin: 10.5 g/dL — ABNORMAL LOW (ref 13.0–17.0)
Hemoglobin: 10.1 g/dL — ABNORMAL LOW (ref 13.0–17.0)
MCH: 26.6 pg (ref 26.0–34.0)
MCH: 26.9 pg (ref 26.0–34.0)
MCHC: 32.2 g/dL (ref 30.0–36.0)
MCHC: 31.6 g/dL (ref 30.0–36.0)
MCV: 82.7 fL (ref 80.0–100.0)
MCV: 85.1 fL (ref 80.0–100.0)
Platelets: 93 10*3/uL — ABNORMAL LOW (ref 150–400)
Platelets: 89 10*3/uL — ABNORMAL LOW (ref 150–400)
RBC: 3.94 MIL/uL — ABNORMAL LOW (ref 4.22–5.81)
RBC: 3.76 MIL/uL — ABNORMAL LOW (ref 4.22–5.81)
RDW: 15.6 % — ABNORMAL HIGH (ref 11.5–15.5)
RDW: 16.1 % — ABNORMAL HIGH (ref 11.5–15.5)
WBC: 12.6 10*3/uL — ABNORMAL HIGH (ref 4.0–10.5)
WBC: 15.7 10*3/uL — ABNORMAL HIGH (ref 4.0–10.5)
nRBC: 0 % (ref 0.0–0.2)
nRBC: 0 % (ref 0.0–0.2)

## 2019-10-06 MED ORDER — ASPIRIN EC 325 MG PO TBEC
325.0000 mg | DELAYED_RELEASE_TABLET | Freq: Every day | ORAL | Status: AC
  Administered 2019-10-06: 325 mg via ORAL
  Filled 2019-10-06: qty 1

## 2019-10-06 MED ORDER — INSULIN ASPART 100 UNIT/ML ~~LOC~~ SOLN: 0.0000 [IU] | SUBCUTANEOUS | Status: DC

## 2019-10-06 MED ORDER — ENOXAPARIN SODIUM 30 MG/0.3ML ~~LOC~~ SOLN
30.0000 mg | Freq: Every day | SUBCUTANEOUS | Status: DC
  Administered 2019-10-07 – 2019-10-10 (×4): 30 mg via SUBCUTANEOUS
  Filled 2019-10-06 (×4): qty 0.3

## 2019-10-06 MED ORDER — ALBUMIN HUMAN 25 % IV SOLN
12.5000 g | Freq: Once | INTRAVENOUS | Status: AC
  Administered 2019-10-06: 12.5 g via INTRAVENOUS
  Filled 2019-10-06: qty 50

## 2019-10-06 MED ORDER — WARFARIN - PHYSICIAN DOSING INPATIENT
Freq: Every day | Status: DC
  Administered 2019-10-06: 1

## 2019-10-06 MED ORDER — ASPIRIN EC 81 MG PO TBEC
81.0000 mg | DELAYED_RELEASE_TABLET | Freq: Every day | ORAL | Status: DC
  Administered 2019-10-07 – 2019-10-13 (×7): 81 mg via ORAL
  Filled 2019-10-06 (×7): qty 1

## 2019-10-06 MED ORDER — INSULIN ASPART 100 UNIT/ML ~~LOC~~ SOLN
0.0000 [IU] | SUBCUTANEOUS | Status: DC
  Administered 2019-10-06 (×2): 2 [IU] via SUBCUTANEOUS
  Administered 2019-10-06: 4 [IU] via SUBCUTANEOUS
  Administered 2019-10-07: 2 [IU] via SUBCUTANEOUS

## 2019-10-06 MED ORDER — WARFARIN SODIUM 2.5 MG PO TABS
2.5000 mg | ORAL_TABLET | Freq: Every day | ORAL | Status: DC
  Administered 2019-10-06 – 2019-10-10 (×5): 2.5 mg via ORAL
  Filled 2019-10-06 (×5): qty 1

## 2019-10-06 MED ORDER — SERTRALINE HCL 50 MG PO TABS
50.0000 mg | ORAL_TABLET | Freq: Every day | ORAL | Status: DC
  Administered 2019-10-07 – 2019-10-12 (×6): 50 mg via ORAL
  Filled 2019-10-06 (×6): qty 1

## 2019-10-06 MED ORDER — POTASSIUM CHLORIDE 10 MEQ/50ML IV SOLN
10.0000 meq | INTRAVENOUS | Status: AC
  Administered 2019-10-06 (×6): 10 meq via INTRAVENOUS
  Filled 2019-10-06 (×3): qty 50

## 2019-10-06 MED ORDER — POTASSIUM CHLORIDE 20 MEQ/15ML (10%) PO SOLN
40.0000 meq | Freq: Once | ORAL | Status: AC
  Administered 2019-10-06: 40 meq
  Filled 2019-10-06: qty 30

## 2019-10-06 MED ORDER — FUROSEMIDE 10 MG/ML IJ SOLN
40.0000 mg | Freq: Once | INTRAMUSCULAR | Status: AC
  Administered 2019-10-06: 40 mg via INTRAVENOUS
  Filled 2019-10-06: qty 4

## 2019-10-06 MED ORDER — GABAPENTIN 300 MG PO CAPS
300.0000 mg | ORAL_CAPSULE | ORAL | Status: DC
  Administered 2019-10-06 – 2019-10-12 (×14): 300 mg via ORAL
  Filled 2019-10-06 (×14): qty 1

## 2019-10-06 MED ORDER — ORAL CARE MOUTH RINSE
15.0000 mL | Freq: Two times a day (BID) | OROMUCOSAL | Status: DC
  Administered 2019-10-06 – 2019-10-08 (×4): 15 mL via OROMUCOSAL

## 2019-10-06 MED ORDER — ATORVASTATIN CALCIUM 40 MG PO TABS
40.0000 mg | ORAL_TABLET | Freq: Every day | ORAL | Status: DC
  Administered 2019-10-09 – 2019-10-13 (×5): 40 mg via ORAL
  Filled 2019-10-06 (×5): qty 1

## 2019-10-06 NOTE — Progress Notes (Signed)
      [address]       [place] [address]             [phone]      POD #1 redo sternotomy, MVR, TV repair  Up in chair  BP (!) 95/56   Pulse 74   Temp 98.5 F (36.9 C) (Oral)   Resp 20   Ht 6' (1.829 m)   Wt 81.2 kg   SpO2 97%   BMI 24.28 kg/m   HFNC 10L 97% sat Levophed down to 7  Intake/Output Summary (Last 24 hours) at 10/06/2019 1721 Last data filed at 10/06/2019 1500 Gross per 24 hour  Intake 3932.91 ml  Output 3315 ml  Net 617.91 ml   Creatinine 1.28, K=5.0 Hct= 32  Continue current care  Blaze Sandin C. Roxan Hockey, MD [facility] [phone]

## 2019-10-06 NOTE — Progress Notes (Signed)
Anesthesiology Follow-up:  69 year old male with severe MR, TR and pulmonary hypertension one day S/P redo MVR and tricuspid valve repair.  Awake and alert in good spirits, neuro intact taking Po liquids, sat in chair today.  Milrinone 0.375 mcg/kg/min  Neosynephrine 7 mcg/kg/min  VS: T-36.9 BP-93/54 HR 77 (atrial sensed, V paced with comlete heart block and ventricular rhythm of 30  underneath) RR-15 O2 sat 97% on 4L [place]  CXR: Lungs expanded mild vascular congestion without airspace disease.  K-5.0 BUN/Cr.- 17/1.28 glucose- 177 H/H-10.1/32 platelets-  89,000  Extubated 12 hours post-op  Stable post-op course, still pacer dependent, wean inotropes as tolerated.  Roberts Gaudy

## 2019-10-06 NOTE — Procedures (Signed)
Extubation Procedure Note  Patient Details:   Name: Alan [name] DOB: 07/18/1950 MRN: [id number]   Airway Documentation:    Vent end date: 10/06/19 Vent end time: 0525   Evaluation  O2 sats: stable throughout Complications: No apparent complications Patient did tolerate procedure well. Bilateral Breath Sounds: Clear, Diminished   Patient extubated to 6L [place]  VC 1.1L NIF -26  Cuff leak positive. Patient able to vocalize post extubation.     Catha Brow 10/06/2019, 5:40 AM

## 2019-10-06 NOTE — Progress Notes (Addendum)
[facility] DAILY ICU PROGRESS NOTE                   [address]            [place] [address]          [phone]   1 Day Post-Op Procedure(s) (LRB): TRANSESOPHAGEAL ECHOCARDIOGRAM (TEE) (N/A) REDO STERNOTOMY (N/A) MITRAL VALVE (MV) REPLACEMENT USING MOSAIC 29MM VALVE (N/A) TRICUSPID VALVE REPAIR USING MC3 28MM ANNULOPLASTY RING (N/A) MAZE (N/A)  Total Length of Stay:  LOS: 1 day   Subjective: Extubated without difficulty at 0525, feels pretty well AV paced for CHB  Objective: Vital signs in last 24 hours: Temp:  [95.9 F (35.5 C)-98.2 F (36.8 C)] 96.8 F (36 C) (08/12 0715) Pulse Rate:  [70-90] 80 (08/12 0715) Cardiac Rhythm: A-V Sequential paced (08/12 0400) Resp:  [12-26] 13 (08/12 0715) BP: (77-118)/(53-80) 97/60 (08/12 0715) SpO2:  [87 %-100 %] 95 % (08/12 0715) Arterial Line BP: (84-201)/(49-198) 104/53 (08/12 0715) FiO2 (%):  [40 %-70 %] 40 % (08/12 0400) Weight:  [81.2 kg] 81.2 kg (08/12 0500)  Filed Weights   10/06/19 0500  Weight: 81.2 kg    Weight change:    Hemodynamic parameters for last 24 hours: PAP: (34-62)/(18-36) 54/26 CVP:  [6 mmHg-22 mmHg] 14 mmHg CO:  [3.4 L/min-7.6 L/min] 5 L/min CI:  [1.7 L/min/m2-3.8 L/min/m2] 2.6 L/min/m2  Intake/Output from previous day: 08/11 0701 - 08/12 0700 In: 8763.8 [I.V.:5074.7; Blood:2460; NG/GT:30; IV Piggyback:1199.1] Out: 6256 [Urine:2610; Emesis/NG output:40; Blood:1203; Chest Tube:680]  Intake/Output this shift: No intake/output data recorded.  Current Meds: Scheduled Meds: . acetaminophen  1,000 mg Oral Q6H  . aspirin EC  325 mg Oral Daily  . [START ON 10/07/2019] aspirin EC  81 mg Oral Daily  . bisacodyl  10 mg Oral Daily   Or  . bisacodyl  10 mg Rectal Daily  . chlorhexidine gluconate (MEDLINE KIT)  15 mL Mouth Rinse BID  . Chlorhexidine Gluconate Cloth  6 each Topical Daily  . Chlorhexidine Gluconate Cloth  6 each Topical Daily  . docusate sodium  200 mg Oral Daily  . insulin  aspart  0-24 Units Subcutaneous Q4H  . mouth rinse  15 mL Mouth Rinse 10 times per day  . [START ON 10/07/2019] pantoprazole  40 mg Oral Daily  . sodium chloride flush  10-40 mL Intracatheter Q12H  . sodium chloride flush  3 mL Intravenous Q12H  . warfarin  2.5 mg Oral q1600   Continuous Infusions: . sodium chloride    . cefUROXime (ZINACEF)  IV Stopped (10/06/19 0659)  . lactated ringers    . lactated ringers    . milrinone 0.5 mcg/kg/min (10/06/19 0700)  . norepinephrine (LEVOPHED) Adult infusion 9 mcg/min (10/06/19 0700)   PRN Meds:.metoprolol tartrate, midazolam, morphine injection, ondansetron (ZOFRAN) IV, oxyCODONE, sodium chloride flush, sodium chloride flush, traMADol  General appearance: alert, cooperative and no distress Neurologic: intact Heart: regular rate and rhythm and paced, soft systolic murmur Lungs: mildly dim in bases Abdomen: soft, non-tender Extremities: no LE edema Wound: fressings CDI  Lab Results: CBC: Recent Labs    10/05/19 2104 10/05/19 2104 10/06/19 0207 10/06/19 0359  WBC 8.8  --   --  12.6*  HGB 10.9*   < > 10.5* 10.5*  HCT 34.6*   < > 31.0* 32.6*  PLT 87*  --   --  93*   < > = values in this interval not displayed.   BMET:  Recent  Labs    10/05/19 2104 10/05/19 2104 10/06/19 0207 10/06/19 0359  NA 141   < > 143 139  K 3.9   < > 3.3* 3.5  CL 111  --   --  110  CO2 20*  --   --  20*  GLUCOSE 113*  --   --  117*  BUN 18  --   --  16  CREATININE 1.02  --   --  1.18  CALCIUM 8.0*  --   --  8.0*   < > = values in this interval not displayed.    CMET: Lab Results  Component Value Date   WBC 12.6 (H) 10/06/2019   HGB 10.5 (L) 10/06/2019   HCT 32.6 (L) 10/06/2019   PLT 93 (L) 10/06/2019   GLUCOSE 117 (H) 10/06/2019   CHOL 154 03/06/2016   TRIG 71 03/06/2016   HDL 45 03/06/2016   LDLCALC 95 03/06/2016   ALT 22 10/03/2019   AST 24 10/03/2019   NA 139 10/06/2019   K 3.5 10/06/2019   CL 110 10/06/2019   CREATININE 1.18  10/06/2019   BUN 16 10/06/2019   CO2 20 (L) 10/06/2019   TSH 1.708 10/18/2014   INR 1.8 (H) 10/05/2019   HGBA1C 6.0 (H) 10/03/2019      PT/INR:  Recent Labs    10/05/19 1650  LABPROT 20.1*  INR 1.8*   Radiology: DG CHEST PORT 1 VIEW  Result Date: 10/06/2019 CLINICAL DATA:  69 year old male intubated, hypoxia, postoperative day 1 mitral and tricuspid valve repair/replacement with Maze procedure. EXAM: PORTABLE CHEST 1 VIEW COMPARISON:  10/05/2019 portable chest and earlier. FINDINGS: Portable AP semi upright view at 0103 hours. Stable endotracheal tube, tip just below the clavicles. An enteric tube is in place but the tip appears to terminates at the level of the gastroesophageal junction as before. Stable left IJ approach Swan-Ganz catheter, tip at the distal right main pulmonary artery level. Mediastinal and bilateral chest tubes appear stable. Stable cardiac size and mediastinal contours. No pneumothorax. Stable mild perihilar atelectasis or vascular congestion without overt edema. No pleural effusion is evident. Incidental epicardial pacer wires. IMPRESSION: 1. Satisfactory ET tube. The enteric tube terminates at the gastroesophageal junction and should be advanced if placement in the stomach is desired. 2. Otherwise stable and satisfactory lines and tubes. No pneumothorax. 3. Stable mild perihilar atelectasis or vascular congestion. No overt edema. Electronically Signed   By: Genevie Ann M.D.   On: 10/06/2019 01:24   DG Chest Port 1 View  Result Date: 10/05/2019 CLINICAL DATA:  Postop mitral valve repair, tricuspid valve repair EXAM: PORTABLE CHEST 1 VIEW COMPARISON:  10/03/2019 FINDINGS: Swan-Ganz catheter tip is in the main right pulmonary artery. Bilateral chest tubes are in place. No pneumothorax. NG tube tip is in the distal esophagus. If the patient is intubated, endotracheal tube is not well visualized. Heart is upper limits normal in size. Mild perihilar opacities could reflect mild  edema. No effusions. IMPRESSION: Support devices as above. If the patient is intubated, endotracheal tube is not well visualized. Mild perihilar opacities, likely mild edema. No pneumothorax. Electronically Signed   By: Rolm Baptise M.D.   On: 10/05/2019 17:31     Assessment/Plan: S/P Procedure(s) (LRB): TRANSESOPHAGEAL ECHOCARDIOGRAM (TEE) (N/A) REDO STERNOTOMY (N/A) MITRAL VALVE (MV) REPLACEMENT USING MOSAIC 29MM VALVE (N/A) TRICUSPID VALVE REPAIR USING MC3 28MM ANNULOPLASTY RING (N/A) MAZE (N/A)  Doing well POD#! 1 hemodyn stable on milrinone and levo, CI good, PAP improved  from preop Swan to be removed, wean as able. 2  Sats are good on 8 liters HFNC 3 AV paced for CHB- monitor closely 4 BS adeq controlled- SSI 5 normal renal fxn, received 40 of lasix, K+ being replaced 6 cont CT's 680 cc yesterday 7 minor leukocytosis, no fevers, prob SIRS- monitor 8 expected ABL anemia- stable- monitor 9 thrombocytopenia- monitor clinically 10 CXR shows vasc congestion- diurese 11 see progression orders, routine pulm toilet and cardiac rehab John Giovanni PA-C Pager [phone] 10/06/2019 7:49 AM    I have seen and examined the patient and agree with the assessment and plan as outlined.  Remains in sinus w/ CHB under pacer.  Stable hemodynamics on milrinone 0.5 and low dose levophed.  Wean milrinone slowly.  Wean levophed as tolerated.  Watch carefully for signs of RV dysfunction.  Mobilize.  Leave tubes in for now.  Start Coumadin slowly.  Rexene Alberts, MD 10/06/2019

## 2019-10-06 NOTE — Discharge Summary (Addendum)
Physician Discharge Summary  Patient ID: Alan [name] MRN: [id number] DOB/AGE: 1950/04/25 69 y.o.  Admit date: 10/05/2019 Discharge date: 10/13/2019  Admission Diagnoses: Severe aortic stenosis and single-vessel coronary artery disease  Discharge Diagnoses:  Principal Problem:   S/P redo mitral valve replacement with bioprosthetic valve + tricuspid valve repair + maze procedure Active Problems:   Paroxysmal atrial fibrillation (HCC)   Atrial flutter with rapid ventricular response (HCC) s/p TEE/DCCV 07/28/19 successful   Severe mitral regurgitation   S/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   S/P CABG x 1   Atrial flutter (HCC)   H/O mitral valve repair   Coronary artery disease involving native coronary artery of native heart without angina pectoris   HTN (hypertension)   Acute on chronic diastolic heart failure (HCC)   Tricuspid regurgitation   S/P tricuspid valve repair   S/P Maze operation for atrial fibrillation   S/P MVR (mitral valve replacement)   Patient Active Problem List   Diagnosis Date Noted  . S/P redo mitral valve replacement with bioprosthetic valve + tricuspid valve repair + maze procedure 10/05/2019  . S/P tricuspid valve repair 10/05/2019  . S/P Maze operation for atrial fibrillation 10/05/2019  . S/P MVR (mitral valve replacement) 10/05/2019  . Tricuspid regurgitation   . Chest pain 07/25/2019  . Dyspnea 05/10/2019  . Acute on chronic diastolic heart failure ([place]) 04/13/2018  . Status post ligation of left atrial appendage 03/20/2018  . H/O mitral valve repair 03/20/2018  . History of aortic valve replacement with bioprosthetic valve 03/20/2018  . Recurrent major depressive disorder ([place]) 03/20/2018  . Coronary artery disease involving native coronary artery of native heart without angina pectoris   . Memory difficulty 02/06/2017  . Migraine 09/12/2015  . Vertigo 09/12/2015  . Atrial flutter ([place])   . Typical atrial flutter ([place])    . Long term current use of anticoagulant therapy 11/17/2014  . 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/2014  . 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  . History of endocarditis 2016   . CAD S/P percutaneous coronary angioplasty 10/23/2014  . Atherosclerotic peripheral vascular disease ([place]) 10/20/2014  . History of stroke 10/20/2014  . Severe mitral regurgitation   . Paroxysmal atrial fibrillation ([place]) 10/19/2014  . Atrial flutter with rapid ventricular response ([place]) s/p TEE/DCCV 07/28/19 successful 10/19/2014  . Prostate cancer ([place]) 07/23/2011  . HTN (hypertension) 08/16/2008  . Depression with anxiety 06/29/2008  . Restless legs syndrome (RLS) 06/21/2007  . Allergic rhinitis 06/21/2007  . 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 with root enlargement using a stented bioprosthetic tissue valve, coronary artery disease status post coronary artery bypass grafting and more recently status post PCI and stenting of the posterior descending coronary artery, mitral regurgitation, chronic diastolic congestive heart failure, recurrent atrial fibrillation and atrial flutter status post DC cardioversion on multiple occasions currently maintaining sinus rhythm on long-term anticoagulation using Xarelto, restless leg syndrome, and remote history of prostate cancer who has been referred for surgical consultation to discuss treatment options for management of severe symptomatic mitral regurgitation.   Patient is well-known to me from his previous hospitalization and surgery for bacterial endocarditis complicated by severe aortic insufficiency with congestive heart failure and septic embolization to the brain and spleen  in 2016.  At the time  he was incidentally found to have multivessel coronary artery disease.  He underwent aortic valve replacement using a 23 mm Edwards Magna Ease stented bioprosthetic tissue valve with bovine pericardial patch enlargement of the aortic root, mitral valve repair without ring annuloplasty, coronary artery bypass grafting using left internal mammary artery to the distal left anterior descending coronary artery, and clipping of the left atrial appendage.  Intraoperative findings were notable for bulky vegetations on the aortic valve with significant leaflet destruction and severe aortic insufficiency but no annular abscess formation.  The mitral valve was functioning normally although there was clearly pre-existing sclerosis and thickening of the anterior leaflet of the mitral valve.  Mitral valve repair consisted only of debridement of the small vegetations adherent to the atrial surface of the anterior leaflet.  Ring annuloplasty was not performed because the patient did not have significant mitral regurgitation.  The patient's postoperative convalescence was notable for recurrent atrial flutter for which he underwent cardioversion.  He otherwise did quite well and routine follow-up echocardiogram performed 3 months postoperatively revealed normal left ventricular systolic function, normal functioning bioprosthetic tissue valve in the aortic position, mild mitral regurgitation, and mild mitral stenosis with mean transvalvular gradient across the mitral valve measured 5 mmHg.   Patient did very well from a cardiac standpoint until November 2019 when he began to develop progressive symptoms of exertional shortness of breath.  He was hospitalized acutely in February 2020 with chest pain and acute exacerbation of congestive heart failure in the setting of rapid atrial flutter.  He was cardioverted back into sinus rhythm but TEE revealed severe mitral regurgitation.  Catheterization revealed multivessel coronary artery  disease with continued patency of left internal mammary artery to the distal left anterior descending coronary artery but further progression of severe stenosis involving posterior descending coronary artery arising off the left circumflex.  This was treated with PCI and stenting using a drug-eluting stent.   Patient again did well until May of this year when he presented with heart failure exacerbation in the setting of recurrent atrial flutter with rapid ventricular response.  He again underwent TEE and cardioversion.  TEE revealed severe mitral regurgitation with preserved left ventricular function.  The bioprosthetic tissue valve in the aortic position was functioning normally.  The patient was seen in follow-up recently by Dr. Sallyanne Kuster at which time he remained in sinus rhythm but he was complaining of worsening symptoms of congestive heart failure, New York Heart Association functional class IIIb.  Repeat echocardiogram confirmed the presence of persistent severe mitral regurgitation despite the fact the patient remained in sinus rhythm with normal left ventricular systolic function.  Left and right heart catheterization was performed September 13, 2019 and revealed multivessel coronary artery disease with chronic occlusion of left anterior descending coronary artery but continued patency of left internal mammary artery to the distal left anterior descending coronary artery.  There remain patent stents in the first obtuse marginal and posterior descending branches of the left circumflex coronary artery.  There was severe pulmonary hypertension with high filling pressures and large V waves on pulmonary capillary wedge tracing consistent with severe mitral regurgitation.  Cardiothoracic surgical consultation was requested.   Patient is married and lives locally in [place] with his wife.  He remains reasonably active physically and functionally independent, although he complains that he has been having  increasing problems over the last several months.  He has severe exertional shortness of breath and occasionally gets  short of breath at rest.  He has been having some trouble sleeping at night.  He asked variances occasional PND and orthopnea.  He has not had lower extremity edema.  He occasionally has fleeting episodes of sharp pain across his chest which are not related to physical exertion and typically only last a few seconds or minutes.  He has not had dizzy spells or syncope.  He has some mild chronic problems with balance but this has been worse recently.  He has had a poor appetite and lost 25 pounds in weight over the six several months.  He denies any fevers or chills.  He has had problems with intermittent headaches which occasionally can be severe.  He otherwise denies any transient focal neurologic symptoms.  He has not been to a dentist in several years although he denies any loose teeth or painful teeth to suggest ongoing infection.   Patient returns to the office today for follow-up prior to elective high risk redo mitral valve replacement with possible Maze procedure and/or tricuspid valve repair scheduled for later this week.  He was last seen here in our office for consultation on September 21, 2019 at which time we made tentative plans for surgery.    Since then he has been seen in follow-up in the dental service clinic by Dr. Lawana Chambers and cleared for surgery.  He reports no new problems or complaints over the past 10 days although he states that he feels like his breathing continues to gradually get worse and he continues to have some atypical chest discomfort which is usually transient and fleeting and resolves within a few minutes.  He denies resting shortness of breath, PND, orthopnea, dizziness, or syncope.  He has not had any prolonged episodes of chest pain.  He has not had fevers, chills, no productive cough.   The patient understands Dr. Roxy Manns will recommend proceeding with elective  admission for surgical intervention.    Discharged Condition: good  Hospital Course: The patient is overall progressed well.  He remained hemodynamically stable and inotropes/pressors were weaned over time. He was monitored closely with daily cooximetry and is noted milrinone was discontinued on postoperative day #5.  It is notable that the patient did have a postoperative complete heart block which was monitored closely for several days and ultimately electrophysiology consulted and recommended permanent pacer placement which was done on 10/10/2019 by Dr. Lovena Le.  His chest tubes had significant drainage which slowed over time.  This was primarily serosanguineous in nature.  Mediastinal tubes were removed on postoperative day #2 and his pleural tubes were removed on postoperative day #6.  Additionally the epicardial pacing wires were also discontinued on postoperative day #6.  He had a postoperative volume overload which is responding well to diuretics.  He has been started on Coumadin with daily INRs and Coumadin dose adjustments over time.  He does have an expected acute blood loss anemia.  Most recent hemoglobin hematocrit dated 8/18 is noted to be 9.9/31.6 .  He did have a slight bump in his creatinine postoperatively to a peak of 1.31 but it has returned into the normal range.  Most recent BUN and creatinine dated 8/18 are 12/.94.  Incisions are noted to be healing well without evidence of infection.  He is tolerating gradually increasing activities using standard cardiac rehab modalities.  Oxygen has been weaned and he maintains good saturations on room air.  His pacer is noted to be functioning well.He does have some mild volume overload ,  so we will continue 40 mg of lasix daily.   He is tolerating gradually increasing activities using routine cardiac rehab modalities.  At the time of discharge the patient is felt to be quite stable.   Consults: cardiology  Significant Diagnostic Studies: routine  post op labs and serial CXR's  Treatments: surgery:  CARDIOTHORACIC SURGERY OPERATIVE NOTE  Date of Procedure:                10/05/2019  Preoperative Diagnosis:        Severe Mitral Regurgitation  Moderate Tricuspid Regurgitation  Recurrent Persistent Atrial Fibrillation and Atrial Flutter  Postoperative Diagnosis:      Severe Mitral Regurgitation  Moderate-severe Tricuspid Regurgitation  Recurrent Persistent Atrial Fibrillation and Atrial Flutter   Procedure:        Mitral Valve Replacement              Redo median sternotomy             Medtronic Mosaic Stented Porcine Bioprosthetic Tissue Valve (size 26mm, model #310, serial #[id number])   Tricuspid Valve Repair              Edwards mc3 ring annuloplasty (size 10mm, model #4900, serial #[id number])   Maze Procedure              complete bilateral atrial lesion set using cryothermy and bipolar radiofrequency ablation                           Surgeon:        [name]. Roxy Manns, MD  Assistant:       John Giovanni, PA-C  Anesthesia:    Roberts Gaudy, MD  Operative Findings: ? Rheumatic mitral valve disease with severe bileaflet thickening, restricted leaflet mobility and foreshortening of the subvalvular apparatus  ? Type IIIA mitral valve dysfunction causing severe mitral regurgitation ? Normal left ventricular systolic function ? Moderate right ventricular chamber enlargement with mild systolic dysfunction ? Severe pulmonary hypertension ? Moderate-severe tricuspid regurgitation                  Additional procedures: Permanent pacemaker placed on 10/10/2019 by Dr. Crissie Sickles.    Discharge Exam: Blood pressure 102/67, pulse 95, temperature 98.1 F (36.7 C), temperature source Oral, resp. rate 17, height 6' (1.829 m), weight 69.2 kg, SpO2 95 %.  General appearance: alert, cooperative and no distress Heart: regular rate and rhythm and soft systolic murmur Lungs: mildly dim  in bases Abdomen: benign Extremities: no edema Wound: incis healing well   Disposition: Discharge disposition: 01-Home or Self Care       Discharge Instructions    Amb Referral to Cardiac Rehabilitation   Complete by: As directed    Diagnosis:  Valve Repair Valve Replacement     Valve:  Mitral Tricuspid     After initial evaluation and assessments completed: Virtual Based Care may be provided alone or in conjunction with Phase 2 Cardiac Rehab based on patient barriers.: Yes   Discharge patient   Complete by: As directed    Discharge disposition: 01-Home or Self Care   Discharge patient date: 10/13/2019     Allergies as of 10/13/2019      Reactions   Pollen Extract Other (See Comments)   sneezing      Medication List    STOP taking these medications   metoprolol tartrate 25 MG tablet Commonly known as: LOPRESSOR   nitroGLYCERIN 0.4 MG  SL tablet Commonly known as: NITROSTAT   rivaroxaban 20 MG Tabs tablet Commonly known as: XARELTO     TAKE these medications   acetaminophen 325 MG tablet Commonly known as: TYLENOL Take 2 tablets (650 mg total) by mouth every 4 (four) hours as needed for headache or mild pain. What changed: how much to take   albuterol 108 (90 Base) MCG/ACT inhaler Commonly known as: VENTOLIN HFA Inhale 2 puffs into the lungs every 6 (six) hours as needed for wheezing or shortness of breath.   amoxicillin 500 MG capsule Commonly known as: AMOXIL Take four capsules 30 - 60 minutes before dental appointment. What changed:   how much to take  how to take this  when to take this  additional instructions   aspirin 81 MG EC tablet Take 1 tablet (81 mg total) by mouth daily. Swallow whole. Start taking on: October 14, 2019   atorvastatin 40 MG tablet Commonly known as: Lipitor Take 1 tablet (40 mg total) by mouth daily.   doxylamine (Sleep) 25 MG tablet Commonly known as: UNISOM Take 12.5 mg by mouth at bedtime.   furosemide 20 MG  tablet Commonly known as: Lasix Take 2 tablets (40 mg total) by mouth daily.   gabapentin 300 MG capsule Commonly known as: NEURONTIN Take 300 mg by mouth See admin instructions. Take one capsule (300 mg) by mouth twice daily - with supper and at bedtime   OMEGA-3 PO Take 1 capsule by mouth daily. Notes to patient: Take as you were prior to admission   potassium chloride SA 20 MEQ tablet Commonly known as: KLOR-CON Take 1 tablet (20 mEq total) by mouth daily.   PreserVision AREDS 2 Caps Take 1 capsule by mouth every evening.   PROBIOTIC PO Take 1 capsule by mouth 2 (two) times daily. Notes to patient: Take as you were prior to admission    sertraline 50 MG tablet Commonly known as: ZOLOFT Take 1 tablet (50 mg total) by mouth daily.   sodium fluoride 1.1 % Crea dental cream Commonly known as: PreviDent 5000 Plus Apply to tooth brush. Brush teeth for 2 minutes. Spit out excess-DO NOT swallow. DO NOT rinse afterwards. Repeat nightly. What changed:   how much to take  how to take this  when to take this   traMADol 50 MG tablet Commonly known as: ULTRAM Take 1 tablet (50 mg total) by mouth every 6 (six) hours as needed for moderate pain.   Turmeric 500 MG Caps Take 500 mg by mouth every evening.   warfarin 5 MG tablet Commonly known as: COUMADIN Take 0.5 tablets (2.5 mg total) by mouth daily at 4 PM. And as directed by the coumadin clinic       Follow-up Information    Rexene Alberts, MD Follow up.   Specialty: Cardiothoracic Surgery Contact information: [address] [place] [address] [phone]        Evans Lance, MD Follow up.   Specialty: Cardiology Why: Please see discharge paperwork for follow-up with cardiologist in pacemaker clinic. Contact information: [address] [place] [address] [phone]        Sanda Klein, MD Follow up.   Specialty: Cardiology Why: Please see discharge paperwork  for follow-up with cardiology as well as appointment in Coumadin clinic to monitor Coumadin dosing with blood tests. Contact information: [address] [place] [address] [phone]  Signed: John Giovanni PA-C 10/13/2019, 2:24 PM

## 2019-10-06 NOTE — Hospital Course (Addendum)
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 with root enlargement using a stented bioprosthetic tissue valve, coronary artery disease status post coronary artery bypass grafting and more recently status post PCI and stenting of the posterior descending coronary artery, mitral regurgitation, chronic diastolic congestive heart failure, recurrent atrial fibrillation and atrial flutter status post DC cardioversion on multiple occasions currently maintaining sinus rhythm on long-term anticoagulation using Xarelto, restless leg syndrome, and remote history of prostate cancer who has been referred for surgical consultation to discuss treatment options for management of severe symptomatic mitral regurgitation.   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  At the time he was incidentally found to have multivessel coronary artery disease.  He underwent aortic valve replacement using a 23 mm Edwards Magna Ease stented bioprosthetic tissue valve with bovine pericardial patch enlargement of the aortic root, mitral valve repair without ring annuloplasty, coronary artery bypass grafting using left internal mammary artery to the distal left anterior descending coronary artery, and clipping of the left atrial appendage.  Intraoperative findings were notable for bulky vegetations on the aortic valve with significant leaflet destruction and severe aortic insufficiency but no annular abscess formation.  The mitral valve was functioning normally although there was clearly pre-existing sclerosis and thickening of the anterior leaflet  of the mitral valve.  Mitral valve repair consisted only of debridement of the small vegetations adherent to the atrial surface of the anterior leaflet.  Ring annuloplasty was not performed because the patient did not have significant mitral regurgitation.  The patient's postoperative convalescence was notable for recurrent atrial flutter for which he underwent cardioversion.  He otherwise did quite well and routine follow-up echocardiogram performed 3 months postoperatively revealed normal left ventricular systolic function, normal functioning bioprosthetic tissue valve in the aortic position, mild mitral regurgitation, and mild mitral stenosis with mean transvalvular gradient across the mitral valve measured 5 mmHg.   Patient did very well from a cardiac standpoint until November 2019 when he began to develop progressive symptoms of exertional shortness of breath.  He was hospitalized acutely in February 2020 with chest pain and acute exacerbation of congestive heart failure in the setting of rapid atrial flutter.  He was cardioverted back into sinus rhythm but TEE revealed severe mitral regurgitation.  Catheterization revealed multivessel coronary artery disease with continued patency of left internal mammary artery to the distal left anterior descending coronary artery but further progression of severe stenosis involving posterior descending coronary artery arising off the left circumflex.  This was treated with PCI and stenting using a drug-eluting stent.   Patient again did well until May of this year when he presented with heart failure exacerbation in the setting of recurrent atrial flutter with rapid ventricular response.  He again underwent TEE and cardioversion.  TEE revealed severe mitral regurgitation with preserved left ventricular function.  The bioprosthetic tissue valve in the aortic position was functioning normally.  The patient was seen in follow-up recently by Dr. Sallyanne Kuster at which time he  remained in sinus rhythm but he was complaining of worsening symptoms of congestive heart failure, New [place] Heart Association functional class IIIb.  Repeat echocardiogram confirmed the presence  of persistent severe mitral regurgitation despite the fact the patient remained in sinus rhythm with normal left ventricular systolic function.  Left and right heart catheterization was performed September 13, 2019 and revealed multivessel coronary artery disease with chronic occlusion of left anterior descending coronary artery but continued patency of left internal mammary artery to the distal left anterior descending coronary artery.  There remain patent stents in the first obtuse marginal and posterior descending branches of the left circumflex coronary artery.  There was severe pulmonary hypertension with high filling pressures and large V waves on pulmonary capillary wedge tracing consistent with severe mitral regurgitation.  Cardiothoracic surgical consultation was requested.   Patient is married and lives locally in [place] with his wife.  He remains reasonably active physically and functionally independent, although he complains that he has been having increasing problems over the last several months.  He has severe exertional shortness of breath and occasionally gets short of breath at rest.  He has been having some trouble sleeping at night.  He asked variances occasional PND and orthopnea.  He has not had lower extremity edema.  He occasionally has fleeting episodes of sharp pain across his chest which are not related to physical exertion and typically only last a few seconds or minutes.  He has not had dizzy spells or syncope.  He has some mild chronic problems with balance but this has been worse recently.  He has had a poor appetite and lost 25 pounds in weight over the six several months.  He denies any fevers or chills.  He has had problems with intermittent headaches which occasionally can be severe.  He  otherwise denies any transient focal neurologic symptoms.  He has not been to a dentist in several years although he denies any loose teeth or painful teeth to suggest ongoing infection.   Patient returns to the office today for follow-up prior to elective high risk redo mitral valve replacement with possible Maze procedure and/or tricuspid valve repair scheduled for later this week.  He was last seen here in our office for consultation on September 21, 2019 at which time we made tentative plans for surgery.    Since then he has been seen in follow-up in the dental service clinic by Dr. Lawana Chambers and cleared for surgery.  He reports no new problems or complaints over the past 10 days although he states that he feels like his breathing continues to gradually get worse and he continues to have some atypical chest discomfort which is usually transient and fleeting and resolves within a few minutes.  He denies resting shortness of breath, PND, orthopnea, dizziness, or syncope.  He has not had any prolonged episodes of chest pain.  He has not had fevers, chills, no productive cough.   The patient understands Dr. Roxy Manns will recommend proceeding with elective admission for surgical intervention.   Postoperative hospital course:  On postoperative day #1 the patient will extubated without difficulty.  He did have some hypoxemia and he was placed on high flow nasal cannula which is being weaned over time.  He is remained hemodynamically stable and has required low-dose milrinone and Levophed which is also being weaned.  Cardiac indices are regulated and PA pressures are improved compared with preoperatively.  Swan-Ganz was removed on postoperative day #1.  Notably he is in a complete heart block and is being AV paced which will be monitored closely.  He does have some moderate volume overload and has been started on  Lasix for diuresis.  Chest tubes continue to have moderate drainage and have been left in place.  He does  have an expected acute blood loss anemia.  Renal function is noted to be within normal limits.  He will be in routine cardiac rehab modalities and pulmonary toilet.  He was followed for several days by electrophysiology consultant but as he continued to have a complete heart block with no escape rhythm it was ultimately determined that a permanent pacemaker should be placed and this was done on 10/10/2019 by Dr. Crissie Sickles.  He tolerated that procedure well.  On postoperative day #6 his epicardial wires and chest tubes were removed.  He was also transferred to the floor for ongoing rehabilitation.  POD#6 weaning O2 and continuing rehab modalities- hopefully home in am  POD#7 - continuing to wean O2

## 2019-10-07 ENCOUNTER — Inpatient Hospital Stay (HOSPITAL_COMMUNITY): Payer: Medicare Other

## 2019-10-07 ENCOUNTER — Inpatient Hospital Stay: Payer: Self-pay

## 2019-10-07 DIAGNOSIS — I442 Atrioventricular block, complete: Secondary | ICD-10-CM | POA: Diagnosis not present

## 2019-10-07 DIAGNOSIS — Z953 Presence of xenogenic heart valve: Secondary | ICD-10-CM | POA: Diagnosis not present

## 2019-10-07 LAB — CBC
HCT: 30.4 % — ABNORMAL LOW (ref 39.0–52.0)
Hemoglobin: 9.7 g/dL — ABNORMAL LOW (ref 13.0–17.0)
MCH: 26.9 pg (ref 26.0–34.0)
MCHC: 31.9 g/dL (ref 30.0–36.0)
MCV: 84.2 fL (ref 80.0–100.0)
Platelets: 84 10*3/uL — ABNORMAL LOW (ref 150–400)
RBC: 3.61 MIL/uL — ABNORMAL LOW (ref 4.22–5.81)
RDW: 16.1 % — ABNORMAL HIGH (ref 11.5–15.5)
WBC: 15 10*3/uL — ABNORMAL HIGH (ref 4.0–10.5)
nRBC: 0 % (ref 0.0–0.2)

## 2019-10-07 LAB — COOXEMETRY PANEL
Carboxyhemoglobin: 1.4 % (ref 0.5–1.5)
Methemoglobin: 1.2 % (ref 0.0–1.5)
O2 Saturation: 78.1 %
Total hemoglobin: 10 g/dL — ABNORMAL LOW (ref 12.0–16.0)

## 2019-10-07 LAB — BASIC METABOLIC PANEL
Anion gap: 9 (ref 5–15)
BUN: 19 mg/dL (ref 8–23)
CO2: 21 mmol/L — ABNORMAL LOW (ref 22–32)
Calcium: 8.3 mg/dL — ABNORMAL LOW (ref 8.9–10.3)
Chloride: 105 mmol/L (ref 98–111)
Creatinine, Ser: 1.31 mg/dL — ABNORMAL HIGH (ref 0.61–1.24)
GFR calc Af Amer: >60 mL/min (ref >60)
GFR calc non Af Amer: 54 mL/min — ABNORMAL LOW (ref >60)
Glucose, Bld: 105 mg/dL — ABNORMAL HIGH (ref 70–99)
Potassium: 4.5 mmol/L (ref 3.5–5.1)
Sodium: 135 mmol/L (ref 135–145)

## 2019-10-07 LAB — GLUCOSE, CAPILLARY
Glucose-Capillary: 99 mg/dL (ref 70–99)
Glucose-Capillary: 158 mg/dL — ABNORMAL HIGH (ref 70–99)
Glucose-Capillary: 119 mg/dL — ABNORMAL HIGH (ref 70–99)
Glucose-Capillary: 123 mg/dL — ABNORMAL HIGH (ref 70–99)
Glucose-Capillary: 121 mg/dL — ABNORMAL HIGH (ref 70–99)

## 2019-10-07 LAB — PROTIME-INR
INR: 1.8 — ABNORMAL HIGH (ref 0.8–1.2)
Prothrombin Time: 20.1 seconds — ABNORMAL HIGH (ref 11.4–15.2)

## 2019-10-07 MED ORDER — SODIUM CHLORIDE 0.9% FLUSH
10.0000 mL | Freq: Two times a day (BID) | INTRAVENOUS | Status: DC
  Administered 2019-10-10 – 2019-10-12 (×5): 10 mL
  Administered 2019-10-13: 20 mL
  Administered 2019-10-13: 10 mL

## 2019-10-07 MED ORDER — FUROSEMIDE 10 MG/ML IJ SOLN
40.0000 mg | Freq: Two times a day (BID) | INTRAMUSCULAR | Status: DC
  Administered 2019-10-07 – 2019-10-12 (×11): 40 mg via INTRAVENOUS
  Filled 2019-10-07 (×11): qty 4

## 2019-10-07 MED ORDER — POTASSIUM CHLORIDE CRYS ER 20 MEQ PO TBCR
20.0000 meq | EXTENDED_RELEASE_TABLET | Freq: Two times a day (BID) | ORAL | Status: DC
  Administered 2019-10-08 – 2019-10-12 (×9): 20 meq via ORAL
  Filled 2019-10-07 (×9): qty 1

## 2019-10-07 MED ORDER — ~~LOC~~ CARDIAC SURGERY, PATIENT & FAMILY EDUCATION: Freq: Once | Status: AC

## 2019-10-07 MED ORDER — INSULIN ASPART 100 UNIT/ML ~~LOC~~ SOLN
0.0000 [IU] | Freq: Three times a day (TID) | SUBCUTANEOUS | Status: DC
  Administered 2019-10-07 – 2019-10-12 (×7): 2 [IU] via SUBCUTANEOUS

## 2019-10-07 MED ORDER — SODIUM CHLORIDE 0.9% FLUSH: 10.0000 mL | INTRAVENOUS | Status: DC | PRN

## 2019-10-07 MED FILL — Lidocaine HCl Local Preservative Free (PF) Inj 2%: INTRAMUSCULAR | Qty: 15 | Status: AC

## 2019-10-07 MED FILL — Heparin Sodium (Porcine) Inj 1000 Unit/ML: INTRAMUSCULAR | Qty: 30 | Status: AC

## 2019-10-07 MED FILL — Potassium Chloride Inj 2 mEq/ML: INTRAVENOUS | Qty: 40 | Status: AC

## 2019-10-07 NOTE — Progress Notes (Addendum)
[facility] DAILY ICU PROGRESS NOTE                   [address]            [place] [address]          [phone]   2 Days Post-Op Procedure(s) (LRB): TRANSESOPHAGEAL ECHOCARDIOGRAM (TEE) (N/A) REDO STERNOTOMY (N/A) MITRAL VALVE (MV) REPLACEMENT USING MOSAIC 29MM VALVE (N/A) TRICUSPID VALVE REPAIR USING MC3 28MM ANNULOPLASTY RING (N/A) MAZE (N/A)  Total Length of Stay:  LOS: 2 days   Subjective:  no specific c/o, feeling a little stronger  Objective: Vital signs in last 24 hours: Temp:  [96.8 F (36 C)-99 F (37.2 C)] 98.6 F (37 C) (08/12 2351) Pulse Rate:  [68-93] 68 (08/13 0715) Cardiac Rhythm: Ventricular paced (08/13 0400) Resp:  [0-28] 20 (08/13 0715) BP: (77-129)/(45-77) 110/67 (08/13 0700) SpO2:  [87 %-99 %] 90 % (08/13 0715) Arterial Line BP: (67-152)/(34-69) 121/56 (08/13 0715) Weight:  [75.5 kg] 75.5 kg (08/13 0500)  Filed Weights   10/06/19 0500 10/07/19 0500  Weight: 81.2 kg 75.5 kg    Weight change: -5.7 kg   Hemodynamic parameters for last 24 hours: PAP: (33-67)/(10-41) 45/15 CVP:  [0 mmHg-26 mmHg] 2 mmHg CO:  [6.9 L/min] 6.9 L/min CI:  [3.5 L/min/m2] 3.5 L/min/m2  Intake/Output from previous day: 08/12 0701 - 08/13 0700 In: 2942.7 [P.O.:1400; I.V.:1002.8; IV Piggyback:540] Out: 2755 [IWPYK:9983; Chest Tube:1010]  Intake/Output this shift: No intake/output data recorded.  Current Meds: Scheduled Meds: . acetaminophen  1,000 mg Oral Q6H  . aspirin EC  81 mg Oral Daily  . [START ON 10/09/2019] atorvastatin  40 mg Oral Daily  . bisacodyl  10 mg Oral Daily   Or  . bisacodyl  10 mg Rectal Daily  . Chlorhexidine Gluconate Cloth  6 each Topical Daily  . Chlorhexidine Gluconate Cloth  6 each Topical Daily  . docusate sodium  200 mg Oral Daily  . enoxaparin (LOVENOX) injection  30 mg Subcutaneous QHS  . gabapentin  300 mg Oral 2 times per day  . insulin aspart  0-24 Units Subcutaneous Q4H  . mouth rinse  15 mL Mouth Rinse BID  .  pantoprazole  40 mg Oral Daily  . sertraline  50 mg Oral QHS  . sodium chloride flush  10-40 mL Intracatheter Q12H  . sodium chloride flush  3 mL Intravenous Q12H  . warfarin  2.5 mg Oral q1600  . Warfarin - Physician Dosing Inpatient   Does not apply q1600   Continuous Infusions: . sodium chloride    . lactated ringers    . lactated ringers 20 mL/hr at 10/07/19 0700  . milrinone 0.25 mcg/kg/min (10/07/19 0700)  . norepinephrine (LEVOPHED) Adult infusion Stopped (10/07/19 0610)   PRN Meds:.metoprolol tartrate, morphine injection, ondansetron (ZOFRAN) IV, oxyCODONE, sodium chloride flush, sodium chloride flush, traMADol  General appearance: alert, cooperative and no distress Heart: regular rate and rhythm and AVpaced, + 2/6 systolicc murmur and rub with CT's in place Lungs: dim in lower fields Abdomen: soft,nontender Extremities: no edema Wound: dressings CDI Correction- VPaced    Lab Results: CBC: Recent Labs    10/06/19 1551 10/07/19 0350  WBC 15.7* 15.0*  HGB 10.1* 9.7*  HCT 32.0* 30.4*  PLT 89* 84*   BMET:  Recent Labs    10/06/19 1551 10/07/19 0350  NA 132* 135  K 5.0 4.5  CL 106 105  CO2 20* 21*  GLUCOSE 177* 105*  BUN 17  19  CREATININE 1.28* 1.31*  CALCIUM 8.0* 8.3*    CMET: Lab Results  Component Value Date   WBC 15.0 (H) 10/07/2019   HGB 9.7 (L) 10/07/2019   HCT 30.4 (L) 10/07/2019   PLT 84 (L) 10/07/2019   GLUCOSE 105 (H) 10/07/2019   CHOL 154 03/06/2016   TRIG 71 03/06/2016   HDL 45 03/06/2016   LDLCALC 95 03/06/2016   ALT 22 10/03/2019   AST 24 10/03/2019   NA 135 10/07/2019   K 4.5 10/07/2019   CL 105 10/07/2019   CREATININE 1.31 (H) 10/07/2019   BUN 19 10/07/2019   CO2 21 (L) 10/07/2019   TSH 1.708 10/18/2014   INR 1.8 (H) 10/07/2019   HGBA1C 6.0 (H) 10/03/2019      PT/INR:  Recent Labs    10/07/19 0350  LABPROT 20.1*  INR 1.8*   Radiology: [place] EKG SITE RITE  Result Date: 10/07/2019 If Site Rite image not attached,  placement could not be confirmed due to current cardiac rhythm.    Assessment/Plan: S/P Procedure(s) (LRB): TRANSESOPHAGEAL ECHOCARDIOGRAM (TEE) (N/A) REDO STERNOTOMY (N/A) MITRAL VALVE (MV) REPLACEMENT USING MOSAIC 29MM VALVE (N/A) TRICUSPID VALVE REPAIR USING MC3 28MM ANNULOPLASTY RING (N/A) MAZE (N/A)   1 doing well POD#2  2 stable vitals,afebrile  v-paced for underlying HB , Co-Ox 78 on .25 milrinone, levo is off, cont to wean milrinone to off 3 sats ok on 4 l HFNC 4 CT's- 1010cc yesterday- leave in place- serosang 5 does not appear significantly volume overloaded, consider check CVP, adeq UOP, creat up slightly. CXR does show some vasc congestion 6 leukocytosis , prob SIRS, monitor trends 7 thrombocytopenia- lower platelet count, cont to monitor trends, may need to consider HIT panel if trends cont to worsen 8 expected acute blood loss anemia- fairly stable, conts to equilabrate- monitor trends 9 INR 1.8- cont coumadin but may need to reduce dose if increases significantly 10 push rehab modalities and pulm toilet as able Alan Giovanni PA-C Pager [phone] 10/07/2019 7:41 AM    I have seen and examined the patient and agree with the assessment and plan as outlined.   Overall doing well POD2.  Still pacer-dependent although rhythm somewhat improved - currently sinus w/ 2nd degree AV block 2:1 conduction.  Stable BP off levophed.  Breathing comfortably w/ O2 sats 94% on 3 L/min and CXR looks good.  Acute on chronic diastolic CHF with expected post-op volume excess, I/O's positive 2.7 liters yesterday but weight down 6 kg - question accuracy and unclear what dry baseline weight should be.  Pleural tubes still draining significant serous fluid.  Expected post op acute blood loss anemia, stable.  Post op thrombocytopenia, stable.   Continue DDD pacing and ask EPS team to follow along  Continue slow milrinone wean  D/C mediastinal tubes and leave pleural tubes in  place  Mobilize  Gentle diuresis  Coumadin  Keep in ICU as long as he remains pacer-dependent   Alan Alberts, MD 10/07/2019 8:38 AM

## 2019-10-07 NOTE — Progress Notes (Signed)
Peripherally Inserted Central Catheter Placement  The IV Nurse has discussed with the patient and/or persons authorized to consent for the patient, the purpose of this procedure and the potential benefits and risks involved with this procedure.  The benefits include less needle sticks, lab draws from the catheter, and the patient may be discharged home with the catheter. Risks include, but not limited to, infection, bleeding, blood clot (thrombus formation), and puncture of an artery; nerve damage and irregular heartbeat and possibility to perform a PICC exchange if needed/ordered by physician.  Alternatives to this procedure were also discussed.  Bard Power PICC patient education guide, fact sheet on infection prevention and patient information card has been provided to patient /or left at bedside.    PICC Placement Documentation  PICC Double Lumen 51/89/84 PICC Right Basilic 40 cm 1 cm (Active)  Indication for Insertion or Continuance of Line Prolonged intravenous therapies 10/07/19 1504  Exposed Catheter (cm) 1 cm 10/07/19 1504  Site Assessment Clean;Dry;Intact 10/07/19 1504  Lumen #1 Status Flushed;Saline locked;Blood return noted 10/07/19 1504  Lumen #2 Status Flushed;Saline locked;Blood return noted 10/07/19 1504  Dressing Type Transparent 10/07/19 1504  Dressing Status Clean;Dry;Intact 10/07/19 1504  Dressing Intervention New dressing 10/07/19 2103  Dressing Change Due 10/14/19 10/07/19 1504       Rea Reser, Nicolette Bang 10/07/2019, 3:06 PM

## 2019-10-07 NOTE — Discharge Instructions (Addendum)
[facility] office [phone]   Mitral Valve Replacement, Care After This sheet gives you information about how to care for yourself after your procedure. Your health care provider may also give you more specific instructions. If you have problems or questions, contact your health care provider. What can I expect after the procedure? After the procedure, it is common to have pain at the incision area. This may last for several weeks. Follow these instructions at home: Incision care   Follow instructions from your health care provider about how to take care of your incision. Make sure you: ? Wash your hands with soap and water before and after you change your bandage (dressing). If soap and water are not available, use hand sanitizer. ? Change your dressing as told by your health care provider. ? Leave stitches (sutures), skin glue, or adhesive strips in place. These skin closures may need to stay in place for 2 weeks or longer. If adhesive strip edges start to loosen and curl up, you may trim the loose edges. Do not remove adhesive strips completely unless your health care provider tells you to do that.  Check your incision area every day for signs of infection. Check for: ? Redness, swelling, or pain. ? Fluid or blood. ? Warmth. ? Pus or a bad smell.  Do not apply powder or lotion to the area. Bathing  Do not take baths, swim, or use a hot tub until your health care provider approves. You may shower    To wash the incision site, gently wash with soap and water and pat the area dry with a clean towel. Do not rub the incision area. That may cause bleeding. Activity  Rest as told by your health care provider.  Avoid sitting for a long time without moving, and avoid crossing your legs. Get up to take short walks every 1-2 hours. This is important to improve blood flow and breathing. Ask for help if you feel weak or unsteady.  Return to your normal activities as told by your health care  provider. Ask your health care provider what activities are safe for you.  Avoid the following activities for 6-8 weeks, or as long as directed: ? Lifting anything that is heavier than 10 lb (4.5 kg), or the limit that you are told. ? Pushing or pulling things with your arms.  Avoid climbing stairs and using the handrail to pull yourself up for the first 2-3 weeks after surgery.  Avoid airplane travel for 4-6 weeks, or as long as directed.  If you are taking blood thinners (anticoagulants), avoid activities that have a high risk of injury. Ask your health care provider what activities are safe for you. Medicines  Take over-the-counter and prescription medicines only as told by your health care provider.  If you are taking blood thinners: ? Talk with your health care provider before you take any medicines that contain aspirin or NSAIDs. These medicines increase your risk for dangerous bleeding. ? Take your medicine exactly as told, at the same time every day. ? Avoid activities that could cause injury or bruising. ? Follow instructions about how to prevent falls. ? Wear a medical alert bracelet or carry a card that lists what medicines you take.  Ask your health care provider if the medicine prescribed to you: ? Requires you to avoid driving or using heavy machinery. ? Can cause constipation. You may need to take actions to prevent or treat constipation, such as:  Drink enough fluid to keep  your urine pale yellow.  Take over-the-counter or prescription medicines.  Eat foods that are high in fiber, such as beans, whole grains, and fresh fruits and vegetables.  Limit foods that are high in fat and processed sugars, such as fried or sweet foods. General instructions   Take your temperature every day and weigh yourself every morning for the first 7 days after surgery. Write your temperatures and weight down and take this record with you to any follow-up visits.  Wear compression  stockings as told by your health care provider. These stockings may help to prevent blood clots and reduce swelling in your legs. You may be asked to wear these stockings for at least 2 weeks after your procedure. If your ankles are swollen after 2 weeks, contact your health care provider to see if you should continue to wear the stockings.  Follow instructions from your health care provider about eating or drinking restrictions.  Do not drink alcohol until your health care provider approves.  Do not drive until your health care provider approves.  Do not use any products that contain nicotine or tobacco, such as cigarettes, e-cigarettes, and chewing tobacco. If you need help quitting, ask your health care provider.  Keep all follow-up visits as told by your health care provider. This is important. Contact a health care provider if:  You develop a skin rash.  Your weight is increasing each day over 2-3 days.  You gain 2 lb (1 kg) or more in a single day. Get help right away if:  You develop chest pain that feels different from the pain caused by your incision.  You develop shortness of breath or difficulty breathing.  You have a fever.  You have redness, swelling, or pain around your incision.  You have fluid or blood coming from your incision.  Your incision feels warm to the touch.  You have pus or a bad smell coming from your incision.  You feel light-headed. Summary  After the procedure, it is common to have pain at the incision area. This may last for several weeks.  Take your temperature every day and weigh yourself every morning for the first 7 days after surgery.  Check your incision area every day for signs of infection.  Keep all follow-up visits as told by your health care provider. This is important. This information is not intended to replace advice given to you by your health care provider. Make sure you discuss any questions you have with your health care  provider. Document Revised: 11/03/2017 Document Reviewed: 11/03/2017 Elsevier Patient Education  2020 [place] on my medicine - Coumadin   (Warfarin)  This medication education was reviewed with me or my healthcare representative as part of my discharge preparation.   Why was Coumadin prescribed for you? Coumadin was prescribed for you because you have a blood clot or a medical condition that can cause an increased risk of forming blood clots. Blood clots can cause serious health problems by blocking the flow of blood to the heart, lung, or brain. Coumadin can prevent harmful blood clots from forming. As a reminder your indication for Coumadin is:   Blood Clot Prevention After Heart Valve Surgery  What test will check on my response to Coumadin? While on Coumadin (warfarin) you will need to have an INR test regularly to ensure that your dose is keeping you in the desired range. The INR (international normalized ratio) number is calculated from the result of the laboratory  test called prothrombin time (PT).  If an INR APPOINTMENT HAS NOT ALREADY BEEN MADE FOR YOU please schedule an appointment to have this lab work done by your health care provider within 7 days. Your INR goal is a number between:  2 to 3   What  do you need to  know  About  COUMADIN? Take Coumadin (warfarin) exactly as prescribed by your healthcare provider about the same time each day.  DO NOT stop taking without talking to the doctor who prescribed the medication.  Stopping without other blood clot prevention medication to take the place of Coumadin may increase your risk of developing a new clot or stroke.  Get refills before you run out.  What do you do if you miss a dose? If you miss a dose, take it as soon as you remember on the same day then continue your regularly scheduled regimen the next day.  Do not take two doses of Coumadin at the same time.  Important Safety Information A possible side  effect of Coumadin (Warfarin) is an increased risk of bleeding. You should call your healthcare provider right away if you experience any of the following: ? Bleeding from an injury or your nose that does not stop. ? Unusual colored urine (red or dark brown) or unusual colored stools (red or black). ? Unusual bruising for unknown reasons. ? A serious fall or if you hit your head (even if there is no bleeding).  Some foods or medicines interact with Coumadin (warfarin) and might alter your response to warfarin. To help avoid this: ? Eat a balanced diet, maintaining a consistent amount of Vitamin K. ? Notify your provider about major diet changes you plan to make. ? Avoid alcohol or limit your intake to 1 drink for women and 2 drinks for men per day. (1 drink is 5 oz. wine, 12 oz. beer, or 1.5 oz. liquor.)  Make sure that ANY health care provider who prescribes medication for you knows that you are taking Coumadin (warfarin).  Also make sure the healthcare provider who is monitoring your Coumadin knows when you have started a new medication including herbals and non-prescription products.  Coumadin (Warfarin)  Major Drug Interactions  Increased Warfarin Effect Decreased Warfarin Effect  Alcohol (large quantities) Antibiotics (esp. Septra/Bactrim, Flagyl, Cipro) Amiodarone (Cordarone) Aspirin (ASA) Cimetidine (Tagamet) Megestrol (Megace) NSAIDs (ibuprofen, naproxen, etc.) Piroxicam (Feldene) Propafenone (Rythmol SR) Propranolol (Inderal) Isoniazid (INH) Posaconazole (Noxafil) Barbiturates (Phenobarbital) Carbamazepine (Tegretol) Chlordiazepoxide (Librium) Cholestyramine (Questran) Griseofulvin Oral Contraceptives Rifampin Sucralfate (Carafate) Vitamin K   Coumadin (Warfarin) Major Herbal Interactions  Increased Warfarin Effect Decreased Warfarin Effect  Garlic Ginseng Ginkgo biloba Coenzyme Q10 Green tea St. John's wort    Coumadin (Warfarin) FOOD Interactions  Eat a  consistent number of servings per week of foods HIGH in Vitamin K (1 serving =  cup)  Collards (cooked, or boiled & drained) Kale (cooked, or boiled & drained) Mustard greens (cooked, or boiled & drained) Parsley *serving size only =  cup Spinach (cooked, or boiled & drained) Swiss chard (cooked, or boiled & drained) Turnip greens (cooked, or boiled & drained)  Eat a consistent number of servings per week of foods MEDIUM-HIGH in Vitamin K (1 serving = 1 cup)  Asparagus (cooked, or boiled & drained) Broccoli (cooked, boiled & drained, or raw & chopped) Brussel sprouts (cooked, or boiled & drained) *serving size only =  cup Lettuce, raw (green leaf, endive, romaine) Spinach, raw Turnip greens, raw & chopped   These  websites have more information on Coumadin (warfarin):  FailFactory.se; VeganReport.com.au;   After Your Pacemaker   . You have a [employer] Pacemaker  ACTIVITY . Do not lift your arm above shoulder height for 1 week after your procedure. After 7 days, you may progress as below.     Tuesday October 18, 2019  Wednesday October 19, 2019 Thursday October 20, 2019 Friday October 21, 2019   . Do not lift, push, pull, or carry anything over 10 pounds with the affected arm until 6 weeks (Tuesday November 22, 2019 ) after your procedure.   . Do NOT DRIVE until you have been seen for your wound check, or as long as instructed by your healthcare provider.   . Ask your healthcare provider when you can go back to work   INCISION/Dressing . If you are on a blood thinner such as Coumadin, Xarelto, Eliquis, Plavix, or Pradaxa please confirm with your provider when this should be resumed. (Coumadin has continued)  . Monitor your Pacemaker site for redness, swelling, and drainage. Call the device clinic at [phone] if you experience these symptoms or fever/chills.  . If your incision is sealed with Steri-strips or staples, you may shower 10 days after your  procedure or when told by your provider. Do not remove the steri-strips or let the shower hit directly on your site. You may wash around your site with soap and water.    Marland Kitchen Avoid lotions, ointments, or perfumes over your incision until it is well-healed.  . You may use a hot tub or a pool AFTER your wound check appointment if the incision is completely closed.  Marland Kitchen PAcemaker Alerts:  Some alerts are vibratory and others beep. These are NOT emergencies. Please call our office to let us know. If this occurs at night or on weekends, it can wait until the next business day. Send a remote transmission.  . If your device is capable of reading fluid status (for heart failure), you will be offered monthly monitoring to review this with you.   DEVICE MANAGEMENT . Remote monitoring is used to monitor your pacemaker from home. This monitoring is scheduled every 91 days by our office. It allows [place] to keep an eye on the functioning of your device to ensure it is working properly. You will routinely see your Electrophysiologist annually (more often if necessary).   . You should receive your ID card for your new device in 4-8 weeks. Keep this card with you at all times once received. Consider wearing a medical alert bracelet or necklace.  . Your Pacemaker may be MRI compatible. This will be discussed at your next office visit/wound check.  You should avoid contact with strong electric or magnetic fields.    Do not use amateur (ham) radio equipment or electric (arc) welding torches. MP3 player headphones with magnets should not be used. Some devices are safe to use if held at least 12 inches (30 cm) from your Pacemaker. These include power tools, lawn mowers, and speakers. If you are unsure if something is safe to use, ask your health care provider.   When using your cell phone, hold it to the ear that is on the opposite side from the Pacemaker. Do not leave your cell phone in a pocket over the  Pacemaker.   You may safely use electric blankets, heating pads, computers, and microwave ovens.  Call the office right away if:  You have chest pain.  You feel more short of breath than you  have felt before.  You feel more light-headed than you have felt before.  Your incision starts to open up.  This information is not intended to replace advice given to you by your health care provider. Make sure you discuss any questions you have with your health care provider.

## 2019-10-07 NOTE — Consult Note (Addendum)
ELECTROPHYSIOLOGY CONSULT NOTE    Patient ID: Alan [name] MRN: [id number], DOB/AGE: 1950-10-01 69 y.o.  Admit date: 10/05/2019 Date of Consult: 10/07/2019  Primary Physician: Aurea Graff, PA-C (Inactive) Primary Cardiologist: Sanda , MD  Electrophysiologist: New  Referring Provider: Dr. Roxy Manns  Patient Profile: Alan [name] is a 69 y.o. 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 Roxy Manns.  HPI:  Alan [name] is a 69 y.o. male with complicated cardiac history as above.  Presented 10/05/2019 for planned mitral valve repair and MAZE. Post op course went generally as expected. Monitoring leukocytosis. Pt had CHB initially that has improved today to 2:1 AVB. EP team asked to see to follow for PPM consideration if his conduction does not continue to improve.   He is feeling overall well today. Remains on milrinone and getting gentle diuresis. Chest tube remains in place.   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   Enterococcal bacteremia 10/19/2014   Heart murmur    Hepatitis    C   Hypertension    Memory difficulty    Migraine  09/12/2015   Mini stroke ([place])    x22   Mitral valve regurgitation, 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/2014   Paroxysmal atrial fibrillation ([place]) 10/19/2014   Paroxysmal atrial flutter ([place]) 10/19/2014   Prostate cancer ([place]) dx'd 2015   surg only per pt   Protein-calorie malnutrition, severe (HCC)    Restless leg syndrome    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/2014   23 mm [facility] Ease bovine pericardial bioprosthetic tissue valve with bovine pericardial patch enlargement of aortic root   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   LIMA to LAD   S/P Maze operation for atrial fibrillation 10/05/2019   Complete bilateral atrial lesion set using cryothermy and bipolar radiofrequency ablation   S/P mitral valve repair 11/01/2014   Debridement of vegetation on anterior leaflet   S/P mitral valve replacement with bioprosthetic valve 10/05/2019   29 mm Medtronic Mosaic stented porcine bioprosthetic tissue valve   S/P tricuspid valve repair 10/05/2019   28 mm [facility] mc3 ring annuloplasty   Septic embolism ([place]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   Tricuspid regurgitation      Surgical History:  Past Surgical History:  Procedure Laterality Date   AORTIC ROOT ENLARGEMENT N/A 11/01/2014   Procedure: AORTIC ROOT ENLARGEMENT;  Surgeon: Rexene Alberts, MD;  Location: [place];  Service: Open Heart Surgery;  Laterality: N/A;   AORTIC VALVE REPLACEMENT N/A 11/01/2014   Procedure: AORTIC VALVE REPLACEMENT (AVR);  Surgeon: Rexene Alberts, MD;  Location: [place];  Service: Open Heart Surgery;  Laterality: N/A;   CARDIAC CATHETERIZATION N/A 10/23/2014   Procedure: Right/Left Heart Cath and Coronary Angiography;  Surgeon: Lorretta Harp, MD;  Location: [place] CV LAB;  Service: Cardiovascular;  Laterality: N/A;   CARDIOVERSION N/A 12/18/2014   Procedure: CARDIOVERSION;  Surgeon: Pixie Casino, MD;  Location: [facility] ENDOSCOPY;   Service: Cardiovascular;  Laterality: N/A;   CARDIOVERSION N/A 02/06/2015   Procedure: CARDIOVERSION;  Surgeon: Larey Dresser, MD;  Location: [place];  Service: Cardiovascular;  Laterality: N/A;   CARDIOVERSION N/A 04/07/2018   Procedure: CARDIOVERSION;  Surgeon: Sanda , MD;  Location: [place] ENDOSCOPY;  Service: Cardiovascular;  Laterality: N/A;   CARDIOVERSION N/A 07/28/2019   Procedure: CARDIOVERSION;  Surgeon: Lelon Perla, MD;  Location: [facility] ENDOSCOPY;  Service: Cardiovascular;  Laterality: N/A;   COLONOSCOPY N/A 10/25/2014   Procedure: COLONOSCOPY;  Surgeon: Arta Silence, MD;  Location: [facility] ENDOSCOPY;  Service: Endoscopy;  Laterality: N/A;   CORONARY ARTERY BYPASS GRAFT N/A 11/01/2014   Procedure: CORONARY ARTERY BYPASS GRAFTING (CABG) x 1 using internal mammary artery;  Surgeon: Rexene Alberts, MD;  Location: [place];  Service: Open Heart Surgery;  Laterality: N/A;   CORONARY CTO INTERVENTION N/A 04/12/2018   Procedure: CORONARY CTO INTERVENTION;  Surgeon: [name], MD;  Location: [place] CV LAB;  Service: Cardiovascular;  Laterality: N/A;   CORONARY STENT INTERVENTION N/A 04/12/2018   Procedure: CORONARY STENT INTERVENTION;  Surgeon: [name], MD;  Location: [place] CV LAB;  Service: Cardiovascular;  Laterality: N/A;   EYE SURGERY Bilateral    cataracts   MAZE N/A 10/05/2019   Procedure: MAZE;  Surgeon: Rexene Alberts, MD;  Location: [place];  Service: Open Heart Surgery;  Laterality: N/A;   MITRAL VALVE REPAIR N/A 11/01/2014   Procedure: MITRAL VALVE REPAIR with no Ring;  Surgeon: Rexene Alberts, MD;  Location: [place];  Service: Open Heart Surgery;  Laterality: N/A;   MITRAL VALVE REPLACEMENT N/A 10/05/2019   Procedure: MITRAL VALVE (MV) REPLACEMENT USING MOSAIC 29MM VALVE;  Surgeon: Rexene Alberts, MD;  Location: [place];  Service: Open Heart Surgery;  Laterality: N/A;   PROSTATECTOMY     RIGHT/LEFT HEART CATH AND CORONARY ANGIOGRAPHY N/A  09/13/2019   Procedure: RIGHT/LEFT HEART CATH AND CORONARY ANGIOGRAPHY;  Surgeon: Martinique, Peter M, MD;  Location: [place] CV LAB;  Service: Cardiovascular;  Laterality: N/A;   RIGHT/LEFT HEART CATH AND CORONARY/GRAFT ANGIOGRAPHY N/A 04/12/2018   Procedure: RIGHT/LEFT HEART CATH AND CORONARY/GRAFT ANGIOGRAPHY;  Surgeon: [name], MD;  Location: [place] CV LAB;  Service: Cardiovascular;  Laterality: N/A;   TEE WITHOUT CARDIOVERSION N/A 10/20/2014   Procedure: TRANSESOPHAGEAL ECHOCARDIOGRAM (TEE);  Surgeon: Josue Hector, MD;  Location: [place];  Service: Cardiovascular;  Laterality: N/A;   TEE WITHOUT CARDIOVERSION N/A 11/01/2014   Procedure: TRANSESOPHAGEAL ECHOCARDIOGRAM (TEE);  Surgeon: Rexene Alberts, MD;  Location: [place];  Service: Open Heart Surgery;  Laterality: N/A;   TEE WITHOUT CARDIOVERSION N/A 04/08/2018   Procedure: TRANSESOPHAGEAL ECHOCARDIOGRAM (TEE);  Surgeon: Jerline Pain, MD;  Location: [facility] ENDOSCOPY;  Service: Cardiovascular;  Laterality: N/A;   TEE WITHOUT CARDIOVERSION N/A 07/28/2019   Procedure: TRANSESOPHAGEAL ECHOCARDIOGRAM (TEE);  Surgeon: Lelon Perla, MD;  Location: [facility] ENDOSCOPY;  Service: Cardiovascular;  Laterality: N/A;   TEE WITHOUT CARDIOVERSION N/A 10/05/2019   Procedure: TRANSESOPHAGEAL ECHOCARDIOGRAM (TEE);  Surgeon: Rexene Alberts, MD;  Location: [place];  Service: Open Heart Surgery;  Laterality: N/A;  TONSILLECTOMY     TRICUSPID VALVE REPLACEMENT N/A 10/05/2019   Procedure: TRICUSPID VALVE REPAIR USING MC3 28MM ANNULOPLASTY RING;  Surgeon: Rexene Alberts, MD;  Location: [place];  Service: Open Heart Surgery;  Laterality: N/A;     Facility-Administered Medications Prior to Admission  Medication Dose Route Frequency Provider Last Rate Last Admin   sodium chloride flush (NS) 0.9 % injection 3 mL  3 mL Intravenous Q12H Croitoru, Mihai, MD       Medications Prior to Admission  Medication Sig Dispense Refill Last Dose   atorvastatin  (LIPITOR) 40 MG tablet Take 1 tablet (40 mg total) by mouth daily. 30 tablet 11 10/04/2019 at Unknown time   doxylamine, Sleep, (UNISOM) 25 MG tablet Take 12.5 mg by mouth at bedtime.   10/04/2019 at Unknown time   furosemide (LASIX) 20 MG tablet Take 2 tablets (40 mg total) by mouth daily. 180 tablet 3 10/04/2019 at Unknown time   gabapentin (NEURONTIN) 300 MG capsule Take 300 mg by mouth See admin instructions. Take one capsule (300 mg) by mouth twice daily - with supper and at bedtime   10/04/2019 at Unknown time   metoprolol tartrate (LOPRESSOR) 25 MG tablet Take 0.5 tablets (12.5 mg total) by mouth 2 (two) times daily. (Patient taking differently: Take 12.5 mg by mouth daily. ) 30 tablet 6 10/04/2019 at 2200   Multiple Vitamins-Minerals (PRESERVISION AREDS 2) CAPS Take 1 capsule by mouth every evening.    10/04/2019 at Unknown time   Omega-3 Fatty Acids (OMEGA-3 PO) Take 1 capsule by mouth daily.   10/04/2019 at Unknown time   potassium chloride SA (KLOR-CON) 20 MEQ tablet Take 1 tablet (20 mEq total) by mouth daily. 90 tablet 3 10/04/2019 at Unknown time   Probiotic Product (PROBIOTIC PO) Take 1 capsule by mouth 2 (two) times daily.    10/04/2019 at Unknown time   sodium fluoride (PREVIDENT 5000 PLUS) 1.1 % CREA dental cream Apply to tooth brush. Brush teeth for 2 minutes. Spit out excess-DO NOT swallow. DO NOT rinse afterwards. Repeat nightly. (Patient taking differently: Place 1 application onto teeth See admin instructions. Apply to tooth brush. Brush teeth for 2 minutes. Spit out excess-DO NOT swallow. DO NOT rinse afterwards. Repeat nightly.) 51 g prn 10/04/2019 at Unknown time   Turmeric 500 MG CAPS Take 500 mg by mouth every evening.   10/04/2019 at Unknown time   acetaminophen (TYLENOL) 325 MG tablet Take 2 tablets (650 mg total) by mouth every 4 (four) hours as needed for headache or mild pain. (Patient taking differently: Take 325 mg by mouth every 4 (four) hours as needed for headache or  mild pain. )   More than a month at Unknown time   albuterol (VENTOLIN HFA) 108 (90 Base) MCG/ACT inhaler Inhale 2 puffs into the lungs every 6 (six) hours as needed for wheezing or shortness of breath.    More than a month at Unknown time   amoxicillin (AMOXIL) 500 MG capsule Take four capsules 30 - 60 minutes before dental appointment. (Patient taking differently: Take 2,000 mg by mouth See admin instructions. Take four capsules (2000 mg) by mouth 30 - 60 minutes before dental appointment.) 4 capsule 2 More than a month at Unknown time   nitroGLYCERIN (NITROSTAT) 0.4 MG SL tablet Place 1 tablet (0.4 mg total) under the tongue every 5 (five) minutes x 3 doses as needed for chest pain. 25 tablet 4 More than a month at Unknown time   rivaroxaban Alveda Reasons)  20 MG TABS tablet Take 1 tablet (20 mg total) by mouth daily. 30 tablet 6 09/26/2019   sertraline (ZOLOFT) 50 MG tablet Take 50 mg by mouth daily.   More than a month at Unknown time    Inpatient Medications:   acetaminophen  1,000 mg Oral Q6H   aspirin EC  81 mg Oral Daily   [START ON 10/09/2019] atorvastatin  40 mg Oral Daily   bisacodyl  10 mg Oral Daily   Or   bisacodyl  10 mg Rectal Daily   Chlorhexidine Gluconate Cloth  6 each Topical Daily   Chlorhexidine Gluconate Cloth  6 each Topical Daily   docusate sodium  200 mg Oral Daily   enoxaparin (LOVENOX) injection  30 mg Subcutaneous QHS   furosemide  40 mg Intravenous BID   gabapentin  300 mg Oral 2 times per day   insulin aspart  0-24 Units Subcutaneous TID AC & HS   mouth rinse  15 mL Mouth Rinse BID   pantoprazole  40 mg Oral Daily   [START ON 10/08/2019] potassium chloride  20 mEq Oral BID   sertraline  50 mg Oral QHS   sodium chloride flush  10-40 mL Intracatheter Q12H   sodium chloride flush  3 mL Intravenous Q12H   warfarin  2.5 mg Oral q1600   Warfarin - Physician Dosing Inpatient   Does not apply q1600    Allergies:  Allergies  Allergen Reactions     Pollen Extract Other (See Comments)    sneezing    Social History   Socioeconomic History   Marital status: Married    Spouse name: Not on file   Number of children: 1   Years of education: Master's   Highest education level: Not on file  Occupational History   Occupation: Booksstore    Employer: [employer]  Tobacco Use   Smoking status: Former Smoker   Smokeless tobacco: Never Used   Tobacco comment: Smoked a pipe age 73->28  Vaping Use   Vaping Use: Never used  Substance and Sexual Activity   Alcohol use: No    Alcohol/week: 0.0 standard drinks    Comment: Drank from age 30->38   Drug use: No   Sexual activity: Not on file  Other Topics Concern   Not on file  Social History Narrative   Lives at home w/ his wife   Right-handed   Caffeine: 2 cups daily   Social Determinants of Health   Financial Resource Strain:    Difficulty of Paying Living Expenses:   Food Insecurity:    Worried About Charity fundraiser in the Last Year:    Arboriculturist in the Last Year:   Transportation Needs:    Film/video editor (Medical):    Lack of Transportation (Non-Medical):   Physical Activity:    Days of Exercise per Week:    Minutes of Exercise per Session:   Stress:    Feeling of Stress :   Social Connections:    Frequency of Communication with Friends and Family:    Frequency of Social Gatherings with Friends and Family:    Attends Religious Services:    Active Member of Clubs or Organizations:    Attends Music therapist:    Marital Status:   Intimate Partner Violence:    Fear of Current or Ex-Partner:    Emotionally Abused:    Physically Abused:    Sexually Abused:      Family History  Problem Relation Age of Onset   Stroke Father      Review of Systems: All other systems reviewed and are otherwise negative except as noted above.  Physical Exam: Vitals:   10/07/19 0945 10/07/19 1015 10/07/19 1030 10/07/19  1045  BP: (!) 166/133 (!) 175/152 (!) 144/74 116/66  Pulse: 69 72 73 70  Resp: (!) 25 (!) 28 (!) 23 (!) 25  Temp:      TempSrc:      SpO2: 96% (!) 88% 94% 96%  Weight:      Height:        GEN- The patient is well appearing considering, alert and oriented x 3 today.   HEENT: normocephalic, atraumatic; sclera clear, conjunctiva pink; hearing intact; oropharynx clear; neck supple Lungs- Clear to ausculation bilaterally, normal work of breathing.  No wheezes, rales, rhonchi. + Chest tube.  Heart- Regular rate and rhythm, no murmurs, rubs or gallops GI- soft, non-tender, non-distended, bowel sounds present Extremities- no clubbing, cyanosis, or edema; DP/PT/radial pulses 2+ bilaterally MS- no significant deformity or atrophy Skin- warm and dry, no rash or lesion Psych- euthymic mood, full affect Neuro- strength and sensation are intact  Labs:   Lab Results  Component Value Date   WBC 15.0 (H) 10/07/2019   HGB 9.7 (L) 10/07/2019   HCT 30.4 (L) 10/07/2019   MCV 84.2 10/07/2019   PLT 84 (L) 10/07/2019    Recent Labs  Lab 10/03/19 1545 10/05/19 0917 10/07/19 0350  NA 139   < > 135  K 4.0   < > 4.5  CL 105   < > 105  CO2 23   < > 21*  BUN 20   < > 19  CREATININE 1.19   < > 1.31*  CALCIUM 9.4   < > 8.3*  PROT 6.8  --   --   BILITOT 1.4*  --   --   ALKPHOS 56  --   --   ALT 22  --   --   AST 24  --   --   GLUCOSE 133*   < > 105*   < > = values in this interval not displayed.      Radiology/Studies: DG Chest 2 View  Result Date: 10/03/2019 CLINICAL DATA:  Preoperative examination, coronary artery disease EXAM: CHEST - 2 VIEW COMPARISON:  07/25/2019 FINDINGS: Lungs are clear. No pneumothorax or pleural effusion. Aortic valve replacement and left atrial clipping has been performed. Cardiac size is within normal limits. Pulmonary vascularity is normal. No acute bone abnormality. IMPRESSION: No active cardiopulmonary disease. Electronically Signed   By: Fidela Salisbury MD   On:  10/03/2019 22:40   MR Brain W Wo Contrast  Result Date: 09/30/2019 CLINICAL DATA:  Possible cerebral hemorrhage. Recent headaches and transient neurological symptoms. EXAM: MRI HEAD WITHOUT AND WITH CONTRAST TECHNIQUE: Multiplanar, multiecho pulse sequences of the brain and surrounding structures were obtained without and with intravenous contrast. CONTRAST:  13mL GADAVIST GADOBUTROL 1 MMOL/ML IV SOLN COMPARISON:  02/19/2017 FINDINGS: Brain: No acute infarct, mass, midline shift, or extra-axial fluid collection is identified. Scattered chronic cerebral and cerebellar microhemorrhages are unchanged. Small chronic infarcts in the left frontal lobe, both parietal lobes, both occipital lobes, and cerebellum are unchanged. Small T2 hyperintensities in the cerebral white matter bilaterally are also unchanged and nonspecific but compatible with mild chronic small vessel ischemic disease. Mild generalized cerebral atrophy is within normal limits for age. No abnormal enhancement is identified. Vascular: Major intracranial vascular flow voids  are preserved. Skull and upper cervical spine: No suspicious marrow lesion Sinuses/Orbits: Bilateral cataract extraction. Subcentimeter mucous retention cyst in the left maxillary sinus. Trace left mastoid fluid. Other: None. IMPRESSION: 1. No acute intracranial abnormality or mass. 2. Chronic ischemia with multiple unchanged infarcts. Electronically Signed   By: Logan Bores M.D.   On: 09/30/2019 08:52   CARDIAC CATHETERIZATION  Result Date: 09/13/2019  Ost LAD lesion is 60% stenosed.  Prox LAD to Mid LAD lesion is 100% stenosed.  Previously placed Ost LPDA to LPDA drug eluting stent is widely patent.  Balloon angioplasty was performed.  Balloon angioplasty was performed.  Prox RCA to Mid RCA lesion is 80% stenosed.  Mid LAD lesion is 50% stenosed.  Ost 1st Mrg to 1st Mrg lesion is 40% stenosed.  Hemodynamic findings consistent with severe pulmonary hypertension.  1. 2  vessel obstructive CAD involving the LAD and nondominant RCA. Stents in the first OM and left PDA are patent 2. Patent LIMA to the LAD 3. Severe pulmonary HTN with mean 46 mm Hg 4. High LV filling pressures with prominent V wave on PCWP tracing c/w severe MR. Plan: CT surgery evaluation for MV repair/replacement.   DG Chest Port 1 View  Result Date: 10/07/2019 CLINICAL DATA:  Status post tricuspid and mitral valve replacements. Status post removal of Swan-Ganz catheter EXAM: PORTABLE CHEST 1 VIEW COMPARISON:  October 06, 2019 FINDINGS: Swan-Ganz catheter has been removed. Cordis tip is in the left innominate vein region. Chest tubes and mediastinal drain remain. Temporary pacemaker wires are attached to the right heart. No pneumothorax. There is cardiomegaly with mild pulmonary venous hypertension. There are scattered areas of atelectatic change in the mid and lower lung regions. No consolidation. Patient is status post tricuspid and mitral valve replacements. Left atrial appendage clamp present. No evident adenopathy. No bone lesions. IMPRESSION: Tube and catheter positions as described without pneumothorax. There is cardiomegaly with pulmonary vascular congestion. There is patchy atelectasis bilaterally without consolidation. Postoperative changes noted. Electronically Signed   By: Lowella Grip III M.D.   On: 10/07/2019 08:18   DG Chest Port 1 View  Result Date: 10/06/2019 CLINICAL DATA:  Status post cardiac surgery EXAM: PORTABLE CHEST 1 VIEW COMPARISON:  Film from earlier in the same day. FINDINGS: Cardiac shadow remains enlarged. Postsurgical changes are again identified and stable. Mediastinal drain, pericardial drain and bilateral thoracostomy tubes are again identified. No pneumothorax is seen. Endotracheal tube and gastric catheter have been removed in the interval. Left-sided Swan-Ganz catheter is seen with the tip in the right pulmonary artery. Mild central vascular congestion is noted and  stable. Slight increase in left basilar atelectasis is noted. IMPRESSION: Stable vascular congestion with new left basilar atelectasis. Tubes and lines as described. Electronically Signed   By: Inez Catalina M.D.   On: 10/06/2019 08:26   DG CHEST PORT 1 VIEW  Result Date: 10/06/2019 CLINICAL DATA:  69 year old male intubated, hypoxia, postoperative day 1 mitral and tricuspid valve repair/replacement with Maze procedure. EXAM: PORTABLE CHEST 1 VIEW COMPARISON:  10/05/2019 portable chest and earlier. FINDINGS: Portable AP semi upright view at 0103 hours. Stable endotracheal tube, tip just below the clavicles. An enteric tube is in place but the tip appears to terminates at the level of the gastroesophageal junction as before. Stable left IJ approach Swan-Ganz catheter, tip at the distal right main pulmonary artery level. Mediastinal and bilateral chest tubes appear stable. Stable cardiac size and mediastinal contours. No pneumothorax. Stable mild perihilar atelectasis or vascular  congestion without overt edema. No pleural effusion is evident. Incidental epicardial pacer wires. IMPRESSION: 1. Satisfactory ET tube. The enteric tube terminates at the gastroesophageal junction and should be advanced if placement in the stomach is desired. 2. Otherwise stable and satisfactory lines and tubes. No pneumothorax. 3. Stable mild perihilar atelectasis or vascular congestion. No overt edema. Electronically Signed   By: Genevie Ann M.D.   On: 10/06/2019 01:24   DG Chest Port 1 View  Result Date: 10/05/2019 CLINICAL DATA:  Postop mitral valve repair, tricuspid valve repair EXAM: PORTABLE CHEST 1 VIEW COMPARISON:  10/03/2019 FINDINGS: Swan-Ganz catheter tip is in the main right pulmonary artery. Bilateral chest tubes are in place. No pneumothorax. NG tube tip is in the distal esophagus. If the patient is intubated, endotracheal tube is not well visualized. Heart is upper limits normal in size. Mild perihilar opacities could  reflect mild edema. No effusions. IMPRESSION: Support devices as above. If the patient is intubated, endotracheal tube is not well visualized. Mild perihilar opacities, likely mild edema. No pneumothorax. Electronically Signed   By: Rolm Baptise M.D.   On: 10/05/2019 17:31   CT ANGIO CHEST AORTA W/CM & OR WO/CM  Result Date: 09/30/2019 CLINICAL DATA:  69 year old male with a history of aortic valve replacement and abdominal pain concerning for aortic dissection. EXAM: CT ANGIOGRAPHY CHEST, ABDOMEN AND PELVIS TECHNIQUE: Non-contrast CT of the chest was initially obtained. Multidetector CT imaging through the chest, abdomen and pelvis was performed using the standard protocol during bolus administration of intravenous contrast. Multiplanar reconstructed images and MIPs were obtained and reviewed to evaluate the vascular anatomy. CONTRAST:  13mL OMNIPAQUE IOHEXOL 350 MG/ML SOLN COMPARISON:  Prior CTA of the chest 07/25/2019 FINDINGS: CTA CHEST FINDINGS Cardiovascular: Initial contrast enhanced images were performed in the pulmonary arterial phase. The main pulmonary artery is normal in size. No filling defect to suggest pulmonary embolus. Imaging was then repeated to ensure appropriate opacification of the aorta. Three vessel aortic arch. Scattered atherosclerotic vascular calcifications. 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 The tubular portion of the ascending thoracic aorta is mildly ectatic at 3.8 cm but not aneurysmal. There is no evidence of thoracic aortic dissection. Cardiomegaly with marked right ventricular and right atrial dilation. Contrast material refluxes into the dilated intrahepatic IVC and hepatic veins. Calcifications are present along the coronary arteries. No pericardial effusion. Left atrial appendage ligation clip noted. Mediastinum/Nodes: Unremarkable CT appearance of the thyroid gland. No suspicious mediastinal or hilar adenopathy. No soft tissue mediastinal  mass. The thoracic esophagus is unremarkable. Lungs/Pleura: Moderate right and small left layering pleural effusions. Diffuse mild to moderate bronchial wall thickening. Respiratory motion limits evaluation for small pulmonary nodules. Mild interlobular septal thickening consistent with trace interstitial edema. Dependent atelectasis in the lower lobes. Musculoskeletal: No acute fracture or aggressive appearing lytic or blastic osseous lesion. Healed median sternotomy. Review of the MIP images confirms the above findings. CTA ABDOMEN AND PELVIS FINDINGS VASCULAR Aorta: Normal caliber aorta without aneurysm, dissection, vasculitis or significant stenosis. Celiac: Patent without evidence of aneurysm, dissection, vasculitis or significant stenosis. SMA: Patent without evidence of aneurysm, dissection, vasculitis or significant stenosis. Renals: Multiple renal arteries. On the right, there is a dominant renal artery with a small accessory artery supplying the lower pole. On the left, there are 3 small co-dominant renal arteries. IMA: Patent without evidence of aneurysm, dissection, vasculitis or significant stenosis. 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 The maximal diameter is 1.5 cm. Otherwise, mild scattered atherosclerotic plaque without significant stenosis, dissection or other aneurysm. Veins: No obvious venous abnormality within the limitations of this arterial phase study. Review of the MIP images confirms the above findings. NON-VASCULAR Hepatobiliary: No focal liver abnormality is seen. No gallstones, gallbladder wall thickening, or biliary dilatation. Pancreas: Unremarkable. No pancreatic ductal dilatation or surrounding inflammatory changes. Spleen: Normal in size without focal abnormality. Adrenals/Urinary Tract: Normal adrenal glands. Renal size discrepancy with the right kidney larger than the  left. Circumscribed 1.1 cm low-attenuation lesion in the interpolar right kidney is too small to characterize but statistically highly likely a benign cyst. The ureters and bladder are unremarkable. Stomach/Bowel: Stomach is within normal limits. Appendix appears normal. No evidence of bowel wall thickening, distention, or inflammatory changes. Lymphatic: No suspicious lymphadenopathy. Reproductive: Surgical changes suggest prior prostatectomy. Other: No abdominal wall hernia. Trace free fluid in the pelvic recess. Musculoskeletal: No acute fracture or aggressive appearing lytic or blastic osseous lesion. Review of the MIP images confirms the above findings. IMPRESSION: 1. No evidence of thoracic or abdominal aortic dissection or other acute arterial abnormality. 2. CT findings suggest significantly elevated right heart pressures and hepatic venous congestion. Differential considerations include tricuspid regurgitation and right-sided congestive heart failure. 3. Moderate right and small left layering pleural effusions. 4. Cardiomegaly with right heart dilation, particularly the right atrium. 5. Surgical changes of aortic valve replacement without evidence of complication. 6. Mild interstitial pulmonary edema. 7. No specific infectious or inflammatory process identified in the abdomen. However, there is a small amount of free fluid layering in the pelvic cul-de-sac which is abnormal in a male patient. Given the clinical history of abdominal pain, this could herald some form of infectious or inflammatory process involving the bowel which is otherwise occult by imaging. However, given the findings of elevated right heart pressures and passive venous congestion in the liver, a small amount of related simple ascites is also possible. 8.  Aortic Atherosclerosis (ICD10-170.0). 9. 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 Maximal diameter 1.5 cm.  Electronically Signed   By: Jacqulynn Cadet M.D.   On: 09/30/2019 08:25   ECHO INTRAOPERATIVE TEE  Result Date: 10/05/2019  *INTRAOPERATIVE TRANSESOPHAGEAL REPORT *  Patient Name:   Klyde A Pienta Date of Exam: 10/05/2019 Medical Rec #:  [id number]              Height:       72.0 in Accession #:    [id number]             Weight:       168.0 lb Date of Birth:  [birth year]              BSA:          1.98 m Patient Age:    37 years               BP:           137/93 mmHg Patient Gender: M                      HR:           71 bpm. Exam Location:  Anesthesiology Transesophogeal exam was perform intraoperatively during surgical procedure. Patient was closely monitored under general anesthesia during the entirety of examination. Indications:     MR I34.0  TR I07.1                  A-Fib I48.0                  A-Flutter I48.92 Sonographer:     Jannett Celestine RDCS (AE) Performing Phys: [id number] CLARENCE H OWEN Complications: No known complications during this procedure. PRE-OP FINDINGS  Left Ventricle: The LV cavity was normal in size and measured 4.6 cm at end-diastole at the mid-papillary level. There was normal LV wall thickness. There was normal systolic function with the ejection fraction measured at 60-65% by 4 and 2 chamber Simpson's method. There was flattening of the interventricular septum in both systole and diastole. On the post-bypass exam, the LV ejection fraction was estimated at 45-50%. There was decreased contractility and dyssynchrony of the interventricular septum. The remaining LV segments appeared to contract adequately. There was no interventricular septal flattening appreciated. Right Ventricle: The RV cavity was moderately enlarged. There was moderate sytolic dysfunction with interventricular septal flattening in both systole and diastole consistent with RV volume and pressure overload. On the post-bypass exam, there appeared to be moderate RV systolic dysfunction but no  interventricular septal flattening present. Left Atrium: The LA cavity was enlarged and measured 4.9 cm in the medial lateral dimension. The left atrial appendage could not be visualized. Therer was no thrombus seen within the LA cavity. Right Atrium: The right atrium was severely dilated and meaured 6.5 cm in diameter. Interatrial Septum: No atrial level shunt detected by color flow Doppler. Pericardium: There is no evidence of pericardial effusion. There were moderate left and right pleural effusions. Mitral Valve: There was severe mitral regurgitation. Both the anterior and posterior leaflets were markedly thickened with restriction to both opening and closing. There was a zone of noncoaptaion in systole which appeared to involve the entire coaptation zones of both leaflets. There was marked systolic flow reversal on pulse wave Doppler interrogation of the left upper and right upper pulmonary veins. The was marked E/A predominence on PW Doppler interrogation of mitral inflow. By the PISA method, the MR ERO was 0.55 cm2 with a MV regurgitant volume of 65 cc. On the post-bypass exam, there was a bioprosthetic mitral valve. The leaflets opened normally and there was no mitral regurgitation seen. The mean transmitral gradient was 3 mm hg. Pulse wave interrogation of the right and left upper pulmonary veins showed forward systole flow in both veins.  Tricuspid Valve: The tricuspid annulus was dilated and measured 4.5 cm at end-diastole in the four chamber view. The tricuspid leaflets had normal thickness and opened normally. There was 2-3 + tricuspid insufficiency as judged by color Doppler with a central jet. On the post-bypass exam, there was an annuloplasty ring in the tricuspid position. There was trace tricuspid insufficiency seen on color Doppler. The mean trans-tricuspid gradient was 3 mm hg by CW Doppler. Aortic Valve: There was a bioprosthetic valve in the aortic position. The leaflets opened normally and  there was no aortic insufficiency by color Doppler. The mean transvalvular gradient was 3 mm hg.  Pulmonic Valve: The pulmonic valve was normal in structure. Pulmonic valve regurgitation is mild by color flow Doppler. Aorta: The ascending aorta and aortic root were normal in diameter with mild intimal thickening present. The descending aorta was normal in diameter with mild scattered intimal thickening present. +--------------+-------++  LEFT VENTRICLE           +--------------+-------++  PLAX 2D                  +--------------+-------++  LVIDd:         4.60 cm   +--------------+-------++  LVIDs:         3.00 cm   +--------------+-------++  LV SV:         62 ml     +--------------+-------++  LV SV Index:   31.69     +--------------+-------++                           +--------------+-------++  +------------------+---------++  LV Volumes (MOD)               +------------------+---------++  LV area d, A2C:    28.90 cm   +------------------+---------++  LV area d, A4C:    18.10 cm   +------------------+---------++  LV area s, A2C:    14.40 cm   +------------------+---------++  LV area s, A4C:    10.00 cm   +------------------+---------++  LV major d, A2C:   7.64 cm     +------------------+---------++  LV major d, A4C:   6.69 cm     +------------------+---------++  LV major s, A2C:   6.62 cm     +------------------+---------++  LV major s, A4C:   5.34 cm     +------------------+---------++  LV vol d, MOD A2C: 89.1 ml     +------------------+---------++  LV vol d, MOD A4C: 40.7 ml     +------------------+---------++  LV vol s, MOD A2C: 28.3 ml     +------------------+---------++  LV vol s, MOD A4C: 14.0 ml     +------------------+---------++  LV SV MOD A2C:     60.8 ml     +------------------+---------++  LV SV MOD A4C:     40.7 ml     +------------------+---------++  LV SV MOD BP:      41.9 ml     +------------------+---------++ +-------------+---------++      +---------------+----------++  MITRAL VALVE                     TRICUSPID VALVE              +-------------+---------++      +---------------+----------++  MV Peak grad: 7.6 mmHg          TR Peak grad:   2.0 mmHg     +-------------+---------++      +---------------+----------++  MV Mean grad: 3.0 mmHg          TR Mean grad:   1.0 mmHg     +-------------+---------++      +---------------+----------++  MV Vmax:      1.38 m/s          TR Vmax:        70.50 cm/s   +-------------+---------++      +---------------+----------++  MV Vmean:     71.7 cm/s         TR Vmean:       49.4 cm/s    +-------------+---------++      +---------------+----------++  MV VTI:       0.24 m      +-------------+---------++ +----------------+-----------++  MR Peak grad:    67.9 mmHg     +----------------+-----------++  MR Mean grad:    39.0 mmHg     +----------------+-----------++  MR Vmax:         412.00 cm/s   +----------------+-----------++  MR Vmean:        288.0 cm/s    +----------------+-----------++  MR PISA:         6.28  cm      +----------------+-----------++  MR PISA Eff ROA: 45 mm        +----------------+-----------++  MR PISA Radius:  1.00 cm       +----------------+-----------++  Roberts Gaudy MD Electronically signed by Roberts Gaudy MD Signature Date/Time: 10/05/2019/9:35:17 PM    Final    VAS US DOPPLER PRE CABG  Result Date: 10/03/2019 PREOPERATIVE VASCULAR EVALUATION  Risk Factors:     Hyperlipidemia. Other Factors:    Mitral valve disease, history of aortic valve disease. Comparison Study: Prior study normal study from 10/28/14 is available for                   comparison Performing Technologist: Sharion Dove RVS  Examination Guidelines: A complete evaluation includes B-mode imaging, spectral Doppler, color Doppler, and power Doppler as needed of all accessible portions of each vessel. Bilateral testing is considered an integral part of a complete examination. Limited examinations for reoccurring indications may be performed as noted.  Right Carotid Findings:  +----------+--------+--------+--------+-----------+------------------+             PSV cm/s EDV cm/s Stenosis Describe    Comments            +----------+--------+--------+--------+-----------+------------------+  CCA Prox   61       20                            intimal thickening  +----------+--------+--------+--------+-----------+------------------+  CCA Distal 88       25                            intimal thickening  +----------+--------+--------+--------+-----------+------------------+  ICA Prox   83       24                homogeneous                     +----------+--------+--------+--------+-----------+------------------+  ICA Distal 92       29                                                +----------+--------+--------+--------+-----------+------------------+  ECA        84       19                                                +----------+--------+--------+--------+-----------+------------------+ Portions of this table do not appear on this page. +----------+--------+-------+--------+------------+             PSV cm/s EDV cms Describe Arm Pressure  +----------+--------+-------+--------+------------+  Subclavian 86                                      +----------+--------+-------+--------+------------+ +---------+--------+--+--------+-+  Vertebral PSV cm/s 39 EDV cm/s 8  +---------+--------+--+--------+-+ Left Carotid Findings: +----------+--------+--------+--------+--------+------------------+             PSV cm/s EDV cm/s Stenosis Describe Comments            +----------+--------+--------+--------+--------+------------------+  CCA Prox   98  18                         intimal thickening  +----------+--------+--------+--------+--------+------------------+  CCA Distal 118      28                         intimal thickening  +----------+--------+--------+--------+--------+------------------+  ICA Prox   61       17                calcific                      +----------+--------+--------+--------+--------+------------------+  ICA Distal 89       29                                             +----------+--------+--------+--------+--------+------------------+  ECA        89       16                                             +----------+--------+--------+--------+--------+------------------+ +----------+--------+--------+--------+------------+  Subclavian PSV cm/s EDV cm/s Describe Arm Pressure  +----------+--------+--------+--------+------------+             136                                      +----------+--------+--------+--------+------------+ +---------+--------+--+--------+--+  Vertebral PSV cm/s 41 EDV cm/s 14  +---------+--------+--+--------+--+  ABI Findings: +--------+------------------+-----+---------+--------+  Right    Rt Pressure (mmHg) Index Waveform  Comment   +--------+------------------+-----+---------+--------+  Brachial 110                      triphasic           +--------+------------------+-----+---------+--------+ +--------+------------------+-----+---------+-------+  Left     Lt Pressure (mmHg) Index Waveform  Comment  +--------+------------------+-----+---------+-------+  Brachial 107                      triphasic          +--------+------------------+-----+---------+-------+  Right Doppler Findings: +--------+--------+-----+---------+--------+  Site     Pressure Index Doppler   Comments  +--------+--------+-----+---------+--------+  Brachial 110            triphasic           +--------+--------+-----+---------+--------+  Radial                  triphasic           +--------+--------+-----+---------+--------+  Ulnar                   triphasic           +--------+--------+-----+---------+--------+  Left Doppler Findings: +--------+--------+-----+---------+--------+  Site     Pressure Index Doppler   Comments  +--------+--------+-----+---------+--------+  Brachial 107            triphasic            +--------+--------+-----+---------+--------+  Radial                  triphasic           +--------+--------+-----+---------+--------+  Ulnar                   triphasic           +--------+--------+-----+---------+--------+  Summary: Right Carotid: The extracranial vessels were near-normal with only minimal wall                thickening or plaque. Left Carotid: The extracranial vessels were near-normal with only minimal wall               thickening or plaque. Vertebrals:  Bilateral vertebral arteries demonstrate antegrade flow. Subclavians: Normal flow hemodynamics were seen in bilateral subclavian              arteries. Right Upper Extremity: Doppler waveforms remain within normal limits with right radial compression. Doppler waveforms remain within normal limits with right ulnar compression. Left Upper Extremity: Doppler waveforms remain within normal limits with left radial compression. Doppler waveform obliterate with left ulnar compression.  Electronically signed by Ruta Hinds MD on 10/03/2019 at 5:13:36 PM.    Final    [place] EKG SITE RITE  Result Date: 10/07/2019 If Site Rite image not attached, placement could not be confirmed due to current cardiac rhythm.  CT Angio Abd/Pel w/ and/or w/o  Result Date: 09/30/2019 CLINICAL DATA:  69 year old male with a history of aortic valve replacement and abdominal pain concerning for aortic dissection. EXAM: CT ANGIOGRAPHY CHEST, ABDOMEN AND PELVIS TECHNIQUE: Non-contrast CT of the chest was initially obtained. Multidetector CT imaging through the chest, abdomen and pelvis was performed using the standard protocol during bolus administration of intravenous contrast. Multiplanar reconstructed images and MIPs were obtained and reviewed to evaluate the vascular anatomy. CONTRAST:  126mL OMNIPAQUE IOHEXOL 350 MG/ML SOLN COMPARISON:  Prior CTA of the chest 07/25/2019 FINDINGS: CTA CHEST FINDINGS Cardiovascular: Initial contrast enhanced images were performed in the  pulmonary arterial phase. The main pulmonary artery is normal in size. No filling defect to suggest pulmonary embolus. Imaging was then repeated to ensure appropriate opacification of the aorta. Three vessel aortic arch. Scattered atherosclerotic vascular calcifications. 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 The tubular portion of the ascending thoracic aorta is mildly ectatic at 3.8 cm but not aneurysmal. There is no evidence of thoracic aortic dissection. Cardiomegaly with marked right ventricular and right atrial dilation. Contrast material refluxes into the dilated intrahepatic IVC and hepatic veins. Calcifications are present along the coronary arteries. No pericardial effusion. Left atrial appendage ligation clip noted. Mediastinum/Nodes: Unremarkable CT appearance of the thyroid gland. No suspicious mediastinal or hilar adenopathy. No soft tissue mediastinal mass. The thoracic esophagus is unremarkable. Lungs/Pleura: Moderate right and small left layering pleural effusions. Diffuse mild to moderate bronchial wall thickening. Respiratory motion limits evaluation for small pulmonary nodules. Mild interlobular septal thickening consistent with trace interstitial edema. Dependent atelectasis in the lower lobes. Musculoskeletal: No acute fracture or aggressive appearing lytic or blastic osseous lesion. Healed median sternotomy. Review of the MIP images confirms the above findings. CTA ABDOMEN AND PELVIS FINDINGS VASCULAR Aorta: Normal caliber aorta without aneurysm, dissection, vasculitis or significant stenosis. Celiac: Patent without evidence of aneurysm, dissection, vasculitis or significant stenosis. SMA: Patent without evidence of aneurysm, dissection, vasculitis or significant stenosis. Renals: Multiple renal arteries. On the right, there is a dominant renal artery with a small accessory artery supplying the lower pole. On the left, there are 3 small co-dominant renal  arteries. IMA: Patent without evidence of aneurysm, dissection, vasculitis or significant stenosis. 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 The maximal diameter is 1.5 cm. Otherwise, mild scattered atherosclerotic plaque without significant stenosis, dissection or other aneurysm. Veins: No obvious venous abnormality within the limitations of this arterial phase study. Review of the MIP images confirms the above findings. NON-VASCULAR Hepatobiliary: No focal liver abnormality is seen. No gallstones, gallbladder wall thickening, or biliary dilatation. Pancreas: Unremarkable. No pancreatic ductal dilatation or surrounding inflammatory changes. Spleen: Normal in size without focal abnormality. Adrenals/Urinary Tract: Normal adrenal glands. Renal size discrepancy with the right kidney larger than the left. Circumscribed 1.1 cm low-attenuation lesion in the interpolar right kidney is too small to characterize but statistically highly likely a benign cyst. The ureters and bladder are unremarkable. Stomach/Bowel: Stomach is within normal limits. Appendix appears normal. No evidence of bowel wall thickening, distention, or inflammatory changes. Lymphatic: No suspicious lymphadenopathy. Reproductive: Surgical changes suggest prior prostatectomy. Other: No abdominal wall hernia. Trace free fluid in the pelvic recess. Musculoskeletal: No acute fracture or aggressive appearing lytic or blastic osseous lesion. Review of the MIP images confirms the above findings. IMPRESSION: 1. No evidence of thoracic or abdominal aortic dissection or other acute arterial abnormality. 2. CT findings suggest significantly elevated right heart pressures and hepatic venous congestion. Differential considerations include tricuspid regurgitation and right-sided congestive heart failure. 3. Moderate right and small left layering pleural  effusions. 4. Cardiomegaly with right heart dilation, particularly the right atrium. 5. Surgical changes of aortic valve replacement without evidence of complication. 6. Mild interstitial pulmonary edema. 7. No specific infectious or inflammatory process identified in the abdomen. However, there is a small amount of free fluid layering in the pelvic cul-de-sac which is abnormal in a male patient. Given the clinical history of abdominal pain, this could herald some form of infectious or inflammatory process involving the bowel which is otherwise occult by imaging. However, given the findings of elevated right heart pressures and passive venous congestion in the liver, a small amount of related simple ascites is also possible. 8.  Aortic Atherosclerosis (ICD10-170.0). 9. 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 Maximal diameter 1.5 cm. Electronically Signed   By: Jacqulynn Cadet M.D.   On: 09/30/2019 08:25    EKG:10/03/2019 showed NSR 73 bpm, PR interval 170, and QRS 92 ms  (personally reviewed)  TELEMETRY: V paced 60-70s, underlying 2:1 AVB (personally reviewed)  Assessment/Plan: 1.  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 Conduction has improved slightly from CHB -> 2:1. Per Dr. Roxy Manns will continue to monitor at least over weekend for PPM consideration.  Avoid AV nodal blockers for now.  Dr. Caryl Comes will continue to follow. Dr. Lovena Le and Dr. Quentin Ore attending Monday, 8/16.  2. S/p Mitral valve replacement this admit (history of bioprosthetic AVR and Mitral Valve repair 2016) Per [facility]CTS.  Slow weaning of milrinone and gentle diuresis on going.   3. CAD s/p previous CABG and PCI Denies anginal symptoms On ASA and statin PTA  4. Atrial flutter NSR s/p DCCV 07/2019, sinus pre op.  S/p MAZE.  On coumadin chronically   EP will follow along with you  For questions or  updates, please contact [place] Please consult [email or web address] for contact info under Cardiology/STEMI.  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  Not altogether sanguine that after redo surgery he will have recovery of conduction although preoperative intervals were normal.  I reviewed with him that we will continue to follow and hopefully there will be recovery and will avoid the need for pacing.

## 2019-10-07 NOTE — Progress Notes (Signed)
EVENING ROUNDS NOTE :     [address]       [place] [address]             [phone]                 2 Days Post-Op Procedure(s) (LRB): TRANSESOPHAGEAL ECHOCARDIOGRAM (TEE) (N/A) REDO STERNOTOMY (N/A) MITRAL VALVE (MV) REPLACEMENT USING MOSAIC 29MM VALVE (N/A) TRICUSPID VALVE REPAIR USING MC3 28MM ANNULOPLASTY RING (N/A) MAZE (N/A)   Total Length of Stay:  LOS: 2 days  Events:   No events Remains paced Sitting up in chair    BP 120/66 (BP Location: Right Arm)    Pulse 71    Temp 98.5 F (36.9 C) (Oral)    Resp (!) 25    Ht 6' (1.829 m)    Wt 75.5 kg    SpO2 90%    BMI 22.57 kg/m          sodium chloride     lactated ringers     lactated ringers 20 mL/hr at 10/07/19 1700   milrinone 0.25 mcg/kg/min (10/07/19 1700)    I/O last 3 completed shifts: In: 4450.7 [P.O.:1400; I.V.:2061.3; NG/GT:30; IV Piggyback:959.4] Out: 3546 [FKCLE:7517; Emesis/NG output:40; Chest Tube:1410]   CBC Latest Ref Rng & Units 10/07/2019 10/06/2019 10/06/2019  WBC 4.0 - 10.5 K/uL 15.0(H) 15.7(H) -  Hemoglobin 13.0 - 17.0 g/dL 9.7(L) 10.1(L) 10.9(L)  Hematocrit 39 - 52 % 30.4(L) 32.0(L) 32.0(L)  Platelets 150 - 400 K/uL 84(L) 89(L) -    BMP Latest Ref Rng & Units 10/07/2019 10/06/2019 10/06/2019  Glucose 70 - 99 mg/dL 105(H) 177(H) -  BUN 8 - 23 mg/dL 19 17 -  Creatinine 0.61 - 1.24 mg/dL 1.31(H) 1.28(H) -  BUN/Creat Ratio 10 - 24 - - -  Sodium 135 - 145 mmol/L 135 132(L) 142  Potassium 3.5 - 5.1 mmol/L 4.5 5.0 4.0  Chloride 98 - 111 mmol/L 105 106 -  CO2 22 - 32 mmol/L 21(L) 20(L) -  Calcium 8.9 - 10.3 mg/dL 8.3(L) 8.0(L) -    ABG    Component Value Date/Time   PHART 7.357 10/06/2019 0638   PCO2ART 37.0 10/06/2019 0638   PO2ART 74 (L) 10/06/2019 0638   HCO3 20.9 10/06/2019 0638   TCO2 22 10/06/2019 0638   ACIDBASEDEF 4.0 (H) 10/06/2019 0638   O2SAT 78.1 10/07/2019 0350       Melodie Bouillon, MD 10/07/2019 5:47 PM

## 2019-10-08 DIAGNOSIS — I442 Atrioventricular block, complete: Secondary | ICD-10-CM | POA: Diagnosis not present

## 2019-10-08 DIAGNOSIS — Z953 Presence of xenogenic heart valve: Secondary | ICD-10-CM | POA: Diagnosis not present

## 2019-10-08 LAB — GLUCOSE, CAPILLARY
Glucose-Capillary: 101 mg/dL — ABNORMAL HIGH (ref 70–99)
Glucose-Capillary: 123 mg/dL — ABNORMAL HIGH (ref 70–99)
Glucose-Capillary: 111 mg/dL — ABNORMAL HIGH (ref 70–99)
Glucose-Capillary: 115 mg/dL — ABNORMAL HIGH (ref 70–99)

## 2019-10-08 LAB — BASIC METABOLIC PANEL
Anion gap: 8 (ref 5–15)
BUN: 23 mg/dL (ref 8–23)
CO2: 25 mmol/L (ref 22–32)
Calcium: 8.4 mg/dL — ABNORMAL LOW (ref 8.9–10.3)
Chloride: 101 mmol/L (ref 98–111)
Creatinine, Ser: 1.27 mg/dL — ABNORMAL HIGH (ref 0.61–1.24)
GFR calc Af Amer: >60 mL/min (ref >60)
GFR calc non Af Amer: 56 mL/min — ABNORMAL LOW (ref >60)
Glucose, Bld: 104 mg/dL — ABNORMAL HIGH (ref 70–99)
Potassium: 4 mmol/L (ref 3.5–5.1)
Sodium: 134 mmol/L — ABNORMAL LOW (ref 135–145)

## 2019-10-08 LAB — CBC
HCT: 29.2 % — ABNORMAL LOW (ref 39.0–52.0)
Hemoglobin: 9.2 g/dL — ABNORMAL LOW (ref 13.0–17.0)
MCH: 26.7 pg (ref 26.0–34.0)
MCHC: 31.5 g/dL (ref 30.0–36.0)
MCV: 84.6 fL (ref 80.0–100.0)
Platelets: 80 10*3/uL — ABNORMAL LOW (ref 150–400)
RBC: 3.45 MIL/uL — ABNORMAL LOW (ref 4.22–5.81)
RDW: 16.3 % — ABNORMAL HIGH (ref 11.5–15.5)
WBC: 11.7 10*3/uL — ABNORMAL HIGH (ref 4.0–10.5)
nRBC: 0 % (ref 0.0–0.2)

## 2019-10-08 LAB — PROTIME-INR
INR: 1.4 — ABNORMAL HIGH (ref 0.8–1.2)
Prothrombin Time: 16.3 seconds — ABNORMAL HIGH (ref 11.4–15.2)

## 2019-10-08 LAB — COOXEMETRY PANEL
Carboxyhemoglobin: 1.7 % — ABNORMAL HIGH (ref 0.5–1.5)
Methemoglobin: 1.2 % (ref 0.0–1.5)
O2 Saturation: 60.9 %
Total hemoglobin: 9.8 g/dL — ABNORMAL LOW (ref 12.0–16.0)

## 2019-10-08 NOTE — Progress Notes (Signed)
Patient Name: Alan [name]   Patient Profile: Alan [name] is a 69 y.o. 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 Roxy Manns.   SUBJECTIVE: without complaint  Past Medical History:  Diagnosis Date  . 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 Arthritis    hands  . Atrial flutter with rapid ventricular response ([place]) s/p TEE/DCCV 07/28/19 successful 10/19/2014  . CHF (congestive heart failure) ([place])   . Coronary artery disease   . Depression   . Dyspnea   . Dysrhythmia   . 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 Enterococcal bacteremia 10/19/2014  . Heart murmur   . Hepatitis    C  . Hypertension   . Memory difficulty   . Migraine 09/12/2015  . Mini stroke ([place])    x22  . 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 Paroxysmal atrial fibrillation ([place]) 10/19/2014  . Paroxysmal atrial flutter ([place]) 10/19/2014  . Prostate cancer ([place]) dx'd 2015   surg only per pt  . Protein-calorie malnutrition, severe ([place])   . Restless leg syndrome   . 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/2014   23 mm [facility] Ease bovine pericardial bioprosthetic tissue valve with bovine pericardial patch enlargement of aortic root  . 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 S/P Maze operation for atrial fibrillation 10/05/2019   Complete bilateral atrial lesion set using cryothermy and bipolar radiofrequency ablation  . S/P mitral valve repair 11/01/2014    Debridement of vegetation on anterior leaflet  . S/P mitral valve replacement with bioprosthetic valve 10/05/2019   29 mm Medtronic Mosaic stented porcine bioprosthetic tissue valve  . S/P tricuspid valve repair 10/05/2019   28 mm Edwards mc3 ring annuloplasty  . Septic embolism ([place]) 10/20/2014   brain and spleen, subclinical  . Severe mitral regurgitation   . 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 Tricuspid regurgitation     Scheduled Meds:  Scheduled Meds: . acetaminophen  1,000 mg Oral Q6H  . aspirin EC  81 mg Oral Daily  . [START ON 10/09/2019] atorvastatin  40 mg Oral Daily  . bisacodyl  10 mg Oral Daily   Or  . bisacodyl  10 mg Rectal Daily  . Chlorhexidine Gluconate Cloth  6 each Topical Daily  . Chlorhexidine Gluconate Cloth  6 each Topical Daily  . docusate sodium  200 mg Oral Daily  . enoxaparin (LOVENOX) injection  30 mg Subcutaneous QHS  . furosemide  40 mg Intravenous BID  . gabapentin  300 mg Oral 2 times per day  . insulin aspart  0-24 Units Subcutaneous TID AC & HS  . mouth rinse  15 mL Mouth Rinse BID  . pantoprazole  40 mg Oral Daily  . potassium chloride  20 mEq Oral BID  . sertraline  50 mg Oral QHS  . sodium chloride flush  10-40 mL Intracatheter Q12H  . sodium chloride flush  10-40 mL Intracatheter Q12H  . sodium chloride flush  3 mL  Intravenous Q12H  . warfarin  2.5 mg Oral q1600  . Warfarin - Physician Dosing Inpatient   Does not apply q1600   Continuous Infusions: . sodium chloride    . lactated ringers    . lactated ringers 10 mL/hr at 10/08/19 1100  . milrinone 0.125 mcg/kg/min (10/08/19 1100)   metoprolol tartrate, morphine injection, ondansetron (ZOFRAN) IV, oxyCODONE, sodium chloride flush, sodium chloride flush, sodium chloride flush, traMADol    PHYSICAL EXAM Vitals:   10/08/19 0800 10/08/19 0801 10/08/19 0900 10/08/19 1154  BP: 112/76  (!) 122/101   Pulse: 79  92   Resp: 14  (!) 22   Temp:  98.3 F (36.8 C)  98.3 F (36.8 C)  TempSrc:   Oral  Oral  SpO2: 94%  (!) 85%   Weight:      Height:       Well developed and nourished in no acute distress HENT normal Neck supple with JVP  Clear Regular rate and rhythm, no murmurs or gallops Abd-soft with active BS No Clubbing cyanosis edema Skin-warm and dry A & Oriented  Grossly normal sensory and motor function      TELEMETRY: Reviewed personnally pt in P-synchronous/ AV  pacing :  Device interrogation demonstrates no intrinsic conduction ventricular escape at about 45 bpm   Intake/Output Summary (Last 24 hours) at 10/08/2019 1220 Last data filed at 10/08/2019 1124 Gross per 24 hour  Intake 526.09 ml  Output 2850 ml  Net -2323.91 ml    LABS: Basic Metabolic Panel: Recent Labs  Lab 10/03/19 1545 10/05/19 0917 10/05/19 1355 10/05/19 1355 10/05/19 1440 10/05/19 1443 10/05/19 2104 10/05/19 2104 10/06/19 0207 10/06/19 0359 10/06/19 0359 10/06/19 0501 10/06/19 1610 10/06/19 1551 10/06/19 1551 10/07/19 0350 10/08/19 0320  NA 139   < > 139   < > 141   < > 141   < > 143 139  --  143 142 132*  --  135 134*  K 4.0   < > 5.2*   < > 4.4   < > 3.9   < > 3.3* 3.5  --  3.6 4.0 5.0  --  4.5 4.0  CL 105   < > 106  --  106  --  111  --   --  110  --   --   --  106  --  105 101  CO2 23  --   --   --   --   --  20*  --   --  20*  --   --   --  20*  --  21* 25  GLUCOSE 133*   < > 99  --  91  --  113*  --   --  117*  --   --   --  177*  --  105* 104*  BUN 20   < > 22  --  23  --  18  --   --  16  --   --   --  17  --  19 23  CREATININE 1.19   < > 0.90  --  0.90  --  1.02  --   --  1.18  --   --   --  1.28*  --  1.31* 1.27*  CALCIUM 9.4  --   --   --   --   --  8.0*   < >  --  8.0*   < >  --   --  8.0*   < > 8.3* 8.4*  MG  --   --   --   --   --   --  2.7*   < >  --  2.4  --   --   --  2.3  --   --   --    < > = values in this interval not displayed.   Cardiac Enzymes: No results for input(s): CKTOTAL, CKMB, CKMBINDEX, TROPONINI in the last 72 hours. CBC: Recent Labs    Lab 10/03/19 1545 10/05/19 0917 10/05/19 1337 10/05/19 1355 10/05/19 1650 10/05/19 2037 10/05/19 2104 10/05/19 2104 10/06/19 0207 10/06/19 0359 10/06/19 0501 10/06/19 9753 10/06/19 1551 10/07/19 0350 10/08/19 0320  WBC 6.5  --   --   --  4.5  --  8.8  --   --  12.6*  --   --  15.7* 15.0* 11.7*  HGB 13.4   < > 8.9*   < > 11.8*   < > 10.9*   < > 10.5* 10.5* 10.9* 10.9* 10.1* 9.7* 9.2*  HCT 43.1   < > 28.6*   < > 37.4*   < > 34.6*   < > 31.0* 32.6* 32.0* 32.0* 32.0* 30.4* 29.2*  MCV 84.0  --   --   --  85.6  --  83.8  --   --  82.7  --   --  85.1 84.2 84.6  PLT 230  --  95*  --  75*  --  87*  --   --  93*  --   --  89* 84* 80*   < > = values in this interval not displayed.   PROTIME: Recent Labs    10/05/19 1650 10/07/19 0350 10/08/19 0320  LABPROT 20.1* 20.1* 16.3*  INR 1.8* 1.8* 1.4*   Liver Function Tests: No results for input(s): AST, ALT, ALKPHOS, BILITOT, PROT, ALBUMIN in the last 72 hours. No results for input(s): LIPASE, AMYLASE in the last 72 hours. BNP: BNP (last 3 results) Recent Labs    07/25/19 0202  BNP 458.3*        ASSESSMENT AND PLAN:  Principal Problem:   S/P redo mitral valve replacement with bioprosthetic valve + tricuspid valve repair + maze procedure Atrial arrhythmias  Complete heart block Coronary artery disease with prior bypass and interval PCI   The patient has persistent complete heart block following redo mitral valve surgery.  Not sanguine that he will have recovery of conduction.  We will continue to follow.  Signed, Virl Axe MD  10/08/2019

## 2019-10-08 NOTE — Progress Notes (Signed)
° °   °  [address]       [place] [address]             [phone]                 3 Days Post-Op Procedure(s) (LRB): TRANSESOPHAGEAL ECHOCARDIOGRAM (TEE) (N/A) REDO STERNOTOMY (N/A) MITRAL VALVE (MV) REPLACEMENT USING MOSAIC 29MM VALVE (N/A) TRICUSPID VALVE REPAIR USING MC3 28MM ANNULOPLASTY RING (N/A) MAZE (N/A)   Events: No events ambulated _______________________________________________________________ Vitals: BP 118/68    Pulse 80    Temp 98.6 F (37 C) (Oral)    Resp 15    Ht 6' (1.829 m)    Wt 78.5 kg    SpO2 93%    BMI 23.47 kg/m   - Neuro: alert NAD  - Cardiovascular: junctional in the 30-40s.  Back up 60s, V pacing in 80s  Drips: milr 0.25.  Co-ox  61    - Pulm: EWOB.  SS CT output    ABG    Component Value Date/Time   PHART 7.357 10/06/2019 0638   PCO2ART 37.0 10/06/2019 0638   PO2ART 74 (L) 10/06/2019 0638   HCO3 20.9 10/06/2019 0638   TCO2 22 10/06/2019 0638   ACIDBASEDEF 4.0 (H) 10/06/2019 0638   O2SAT 60.9 10/08/2019 0320    - Abd: soft - Extremity: warm  .Intake/Output      08/13 0701 - 08/14 0700 08/14 0701 - 08/15 0700   P.O. 480    I.V. (mL/kg) 585.7 (7.5)    IV Piggyback     Total Intake(mL/kg) 1065.7 (13.6)    Urine (mL/kg/hr) 2100 (1.1)    Chest Tube 630    Total Output 2730    Net -1664.3         Urine Occurrence 2 x       _______________________________________________________________ Labs: CBC Latest Ref Rng & Units 10/08/2019 10/07/2019 10/06/2019  WBC 4.0 - 10.5 K/uL 11.7(H) 15.0(H) 15.7(H)  Hemoglobin 13.0 - 17.0 g/dL 9.2(L) 9.7(L) 10.1(L)  Hematocrit 39 - 52 % 29.2(L) 30.4(L) 32.0(L)  Platelets 150 - 400 K/uL 80(L) 84(L) 89(L)   CMP Latest Ref Rng & Units 10/08/2019 10/07/2019 10/06/2019  Glucose 70 - 99 mg/dL 104(H) 105(H) 177(H)  BUN 8 - 23 mg/dL 23 19 17   Creatinine 0.61 - 1.24 mg/dL 1.27(H) 1.31(H) 1.28(H)  Sodium 135 - 145 mmol/L 134(L) 135 132(L)  Potassium 3.5 - 5.1 mmol/L 4.0 4.5 5.0    Chloride 98 - 111 mmol/L 101 105 106  CO2 22 - 32 mmol/L 25 21(L) 20(L)  Calcium 8.9 - 10.3 mg/dL 8.4(L) 8.3(L) 8.0(L)  Total Protein 6.5 - 8.1 g/dL - - -  Total Bilirubin 0.3 - 1.2 mg/dL - - -  Alkaline Phos 38 - 126 U/L - - -  AST 15 - 41 U/L - - -  ALT 0 - 44 U/L - - -    CXR: stable  _______________________________________________________________  Assessment and Plan: POD 3 s/p MVR, TVR, complete heart block  Neuro: pain controlled CV: will decrease milr.  Today.  Will continue pacing.   Pulm: will keep CT given output.  Continue pulm toilet Renal: creat stable.  Good uop.  diuresing GI: on diet Heme: heme table.  plts stable ID: [email or web address] Endo: SSO Dispo: continue ICU care for heart block  Melodie Bouillon, MD 10/08/2019 7:39 AM

## 2019-10-09 DIAGNOSIS — I442 Atrioventricular block, complete: Secondary | ICD-10-CM | POA: Diagnosis not present

## 2019-10-09 DIAGNOSIS — Z953 Presence of xenogenic heart valve: Secondary | ICD-10-CM | POA: Diagnosis not present

## 2019-10-09 LAB — TYPE AND SCREEN
ABO/RH(D): B POS
Antibody Screen: NEGATIVE
Unit division: 0
Unit division: 0
Unit division: 0
Unit division: 0
Unit division: 0
Unit division: 0

## 2019-10-09 LAB — BPAM RBC
Blood Product Expiration Date: 202109082359
Blood Product Expiration Date: 202109092359
Blood Product Expiration Date: 202109122359
Blood Product Expiration Date: 202109122359
Blood Product Expiration Date: 202109122359
Blood Product Expiration Date: 202109142359
ISSUE DATE / TIME: 202108110933
ISSUE DATE / TIME: 202108110933
ISSUE DATE / TIME: 202108131813
ISSUE DATE / TIME: 202108132200
Unit Type and Rh: 7300
Unit Type and Rh: 7300
Unit Type and Rh: 7300
Unit Type and Rh: 7300
Unit Type and Rh: 7300
Unit Type and Rh: 7300

## 2019-10-09 LAB — PROTIME-INR
INR: 1.4 — ABNORMAL HIGH (ref 0.8–1.2)
Prothrombin Time: 16.5 seconds — ABNORMAL HIGH (ref 11.4–15.2)

## 2019-10-09 LAB — BASIC METABOLIC PANEL
Anion gap: 8 (ref 5–15)
BUN: 17 mg/dL (ref 8–23)
CO2: 28 mmol/L (ref 22–32)
Calcium: 8.8 mg/dL — ABNORMAL LOW (ref 8.9–10.3)
Chloride: 98 mmol/L (ref 98–111)
Creatinine, Ser: 0.96 mg/dL (ref 0.61–1.24)
GFR calc Af Amer: >60 mL/min (ref >60)
GFR calc non Af Amer: >60 mL/min (ref >60)
Glucose, Bld: 124 mg/dL — ABNORMAL HIGH (ref 70–99)
Potassium: 4 mmol/L (ref 3.5–5.1)
Sodium: 134 mmol/L — ABNORMAL LOW (ref 135–145)

## 2019-10-09 LAB — GLUCOSE, CAPILLARY
Glucose-Capillary: 101 mg/dL — ABNORMAL HIGH (ref 70–99)
Glucose-Capillary: 120 mg/dL — ABNORMAL HIGH (ref 70–99)
Glucose-Capillary: 122 mg/dL — ABNORMAL HIGH (ref 70–99)
Glucose-Capillary: 147 mg/dL — ABNORMAL HIGH (ref 70–99)

## 2019-10-09 LAB — COOXEMETRY PANEL
Carboxyhemoglobin: 1.8 % — ABNORMAL HIGH (ref 0.5–1.5)
Methemoglobin: 1 % (ref 0.0–1.5)
O2 Saturation: 62.3 %
Total hemoglobin: 9.9 g/dL — ABNORMAL LOW (ref 12.0–16.0)

## 2019-10-09 LAB — CBC
HCT: 29.7 % — ABNORMAL LOW (ref 39.0–52.0)
Hemoglobin: 9.1 g/dL — ABNORMAL LOW (ref 13.0–17.0)
MCH: 26.1 pg (ref 26.0–34.0)
MCHC: 30.6 g/dL (ref 30.0–36.0)
MCV: 85.1 fL (ref 80.0–100.0)
Platelets: 94 10*3/uL — ABNORMAL LOW (ref 150–400)
RBC: 3.49 MIL/uL — ABNORMAL LOW (ref 4.22–5.81)
RDW: 16.2 % — ABNORMAL HIGH (ref 11.5–15.5)
WBC: 8.1 10*3/uL (ref 4.0–10.5)
nRBC: 0 % (ref 0.0–0.2)

## 2019-10-09 NOTE — Plan of Care (Signed)
  Problem: Clinical Measurements: Goal: Postoperative complications will be avoided or minimized Outcome: Progressing   Problem: Pain Managment: Goal: General experience of comfort will improve Outcome: Progressing   Problem: Safety: Goal: Ability to remain free from injury will improve Outcome: Progressing

## 2019-10-09 NOTE — Progress Notes (Signed)
      [address]       ,[place] [address]             [phone]                 4 Days Post-Op Procedure(s) (LRB): TRANSESOPHAGEAL ECHOCARDIOGRAM (TEE) (N/A) REDO STERNOTOMY (N/A) MITRAL VALVE (MV) REPLACEMENT USING MOSAIC 29MM VALVE (N/A) TRICUSPID VALVE REPAIR USING MC3 28MM ANNULOPLASTY RING (N/A) MAZE (N/A)   Events: No events  _______________________________________________________________ Vitals: BP 101/64   Pulse 81   Temp 98.2 F (36.8 C) (Oral)   Resp (!) 9   Ht 6' (1.829 m)   Wt 76.7 kg   SpO2 94%   BMI 22.93 kg/m   - Neuro: alert NAD  - Cardiovascular: CHB without junctional escape.  Back up 60s, V pacing in 80s  Drips: milr 0.125  Co-ox  62    - Pulm: EWOB.  SS CT output    ABG    Component Value Date/Time   PHART 7.357 10/06/2019 0638   PCO2ART 37.0 10/06/2019 0638   PO2ART 74 (L) 10/06/2019 0638   HCO3 20.9 10/06/2019 0638   TCO2 22 10/06/2019 0638   ACIDBASEDEF 4.0 (H) 10/06/2019 0638   O2SAT 62.3 10/09/2019 0359    - Abd: soft - Extremity: warm  .Intake/Output      08/14 0701 - 08/15 0700   P.O. 300   I.V. (mL/kg) 313 (4.1)   Total Intake(mL/kg) 613 (8)   Urine (mL/kg/hr) 3775 (2.1)   Stool 0   Chest Tube 400   Total Output 4175   Net -3562       Urine Occurrence 1 x   Stool Occurrence 2 x      _______________________________________________________________ Labs: CBC Latest Ref Rng & Units 10/09/2019 10/08/2019 10/07/2019  WBC 4.0 - 10.5 K/uL 8.1 11.7(H) 15.0(H)  Hemoglobin 13.0 - 17.0 g/dL 9.1(L) 9.2(L) 9.7(L)  Hematocrit 39 - 52 % 29.7(L) 29.2(L) 30.4(L)  Platelets 150 - 400 K/uL 94(L) 80(L) 84(L)   CMP Latest Ref Rng & Units 10/09/2019 10/08/2019 10/07/2019  Glucose 70 - 99 mg/dL 124(H) 104(H) 105(H)  BUN 8 - 23 mg/dL 17 23 19   Creatinine 0.61 - 1.24 mg/dL 0.96 1.27(H) 1.31(H)  Sodium 135 - 145 mmol/L 134(L) 134(L) 135  Potassium 3.5 - 5.1 mmol/L 4.0 4.0 4.5  Chloride 98 - 111 mmol/L 98 101 105    CO2 22 - 32 mmol/L 28 25 21(L)  Calcium 8.9 - 10.3 mg/dL 8.8(L) 8.4(L) 8.3(L)  Total Protein 6.5 - 8.1 g/dL - - -  Total Bilirubin 0.3 - 1.2 mg/dL - - -  Alkaline Phos 38 - 126 U/L - - -  AST 15 - 41 U/L - - -  ALT 0 - 44 U/L - - -    CXR: pending  _______________________________________________________________  Assessment and Plan: POD 4 s/p MVR, TVR, complete heart block  Neuro: pain controlled CV: off milr today.  Will continue pacing.   Pulm: will remove chest tubes Continue pulm toilet Renal: creat stable.  Good uop.  diuresing GI: on diet Heme: heme table.  plts stable.  On coumadin.  INR 1.4.  Will hold off on heparin given that this was a mitral replacement with bioprosthetic valve. ID: [email or web address] Endo: SSO Dispo: continue ICU care for heart block  Melodie Bouillon, MD 10/09/2019 6:39 AM

## 2019-10-09 NOTE — Anesthesia Postprocedure Evaluation (Signed)
Anesthesia Post Note  Patient: Alan [name]  Procedure(s) Performed: TRANSESOPHAGEAL ECHOCARDIOGRAM (TEE) (N/A ) REDO STERNOTOMY (N/A Chest) MITRAL VALVE (MV) REPLACEMENT USING MOSAIC 29MM VALVE (N/A Chest) TRICUSPID VALVE REPAIR USING MC3 28MM ANNULOPLASTY RING (N/A Chest) MAZE (N/A Chest)     Patient location during evaluation: SICU Anesthesia Type: General Level of consciousness: awake and alert Pain management: pain level controlled Vital Signs Assessment: post-procedure vital signs reviewed and stable Respiratory status: spontaneous breathing, nonlabored ventilation, respiratory function stable and patient connected to nasal cannula oxygen Cardiovascular status: stable and blood pressure returned to baseline Postop Assessment: no apparent nausea or vomiting Anesthetic complications: no   No complications documented.  Last Vitals:  Vitals:   10/09/19 1400 10/09/19 1600  BP: 96/65 101/66  Pulse: 82 82  Resp: 13 14  Temp:    SpO2: 93% 94%    Last Pain:  Vitals:   10/09/19 1600  TempSrc:   PainSc: 3                  Shamariah Shewmake COKER

## 2019-10-09 NOTE — Progress Notes (Signed)
Patient Name: Alan [name]   Patient Profile: Alan [name] is a 69 y.o. 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 Roxy Manns.   SUBJECTIVE: some chest pain this am with ambulation  No pain or sob at rest  Past Medical History:  Diagnosis Date  . 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 Arthritis    hands  . Atrial flutter with rapid ventricular response ([place]) s/p TEE/DCCV 07/28/19 successful 10/19/2014  . CHF (congestive heart failure) ([place])   . Coronary artery disease   . Depression   . Dyspnea   . Dysrhythmia   . 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 Enterococcal bacteremia 10/19/2014  . Heart murmur   . Hepatitis    C  . Hypertension   . Memory difficulty   . Migraine 09/12/2015  . Mini stroke ([place])    x22  . 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 Paroxysmal atrial fibrillation ([place]) 10/19/2014  . Paroxysmal atrial flutter ([place]) 10/19/2014  . Prostate cancer ([place]) dx'd 2015   surg only per pt  . Protein-calorie malnutrition, severe ([place])   . Restless leg syndrome   . 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/2014   23 mm [facility] Ease bovine pericardial bioprosthetic tissue valve with bovine pericardial patch enlargement of aortic root  . 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 S/P Maze operation for atrial fibrillation 10/05/2019   Complete bilateral atrial lesion set using cryothermy and bipolar radiofrequency  ablation  . S/P mitral valve repair 11/01/2014   Debridement of vegetation on anterior leaflet  . S/P mitral valve replacement with bioprosthetic valve 10/05/2019   29 mm [employer]tronic Mosaic stented porcine bioprosthetic tissue valve  . S/P tricuspid valve repair 10/05/2019   28 mm Edwards mc3 ring annuloplasty  . Septic embolism ([place]) 10/20/2014   brain and spleen, subclinical  . Severe mitral regurgitation   . 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 Tricuspid regurgitation     Scheduled Meds:  Scheduled Meds: . acetaminophen  1,000 mg Oral Q6H  . aspirin EC  81 mg Oral Daily  . atorvastatin  40 mg Oral Daily  . bisacodyl  10 mg Oral Daily   Or  . bisacodyl  10 mg Rectal Daily  . Chlorhexidine Gluconate Cloth  6 each Topical Daily  . Chlorhexidine Gluconate Cloth  6 each Topical Daily  . docusate sodium  200 mg Oral Daily  . enoxaparin (LOVENOX) injection  30 mg Subcutaneous QHS  . furosemide  40 mg Intravenous BID  . gabapentin  300 mg Oral 2 times per day  . insulin aspart  0-24 Units Subcutaneous TID AC & HS  . mouth rinse  15 mL Mouth Rinse BID  . pantoprazole  40 mg Oral Daily  . potassium chloride  20 mEq Oral BID  . sertraline  50 mg Oral QHS  . sodium chloride flush  10-40 mL Intracatheter Q12H  . sodium chloride flush  10-40 mL Intracatheter  Q12H  . sodium chloride flush  3 mL Intravenous Q12H  . warfarin  2.5 mg Oral q1600  . Warfarin - Physician Dosing Inpatient   Does not apply q1600   Continuous Infusions: . sodium chloride    . lactated ringers    . lactated ringers 10 mL/hr at 10/09/19 0900  . milrinone 0.125 mcg/kg/min (10/09/19 0900)   metoprolol tartrate, morphine injection, ondansetron (ZOFRAN) IV, oxyCODONE, sodium chloride flush, sodium chloride flush, sodium chloride flush, traMADol    PHYSICAL EXAM Vitals:   10/09/19 0700 10/09/19 0800 10/09/19 0900 10/09/19 0908  BP: 128/80 125/84  (!) 144/90  Pulse: 80 83 84 90  Resp: 20 17 (!) 23 17  Temp:        TempSrc:      SpO2: 96% 95% 91% 93%  Weight:      Height:        Well developed and nourished in no acute distress HENT normal Neck supple   Clear Regular rate and rhythm, no murmurs or gallops Abd-soft with active BS No Clubbing cyanosis edema Skin-warm and dry A & Oriented  Grossly normal sensory and motor function     TELEMETRY: Reviewed personnally P-synchronous/ AV  pacing   Device interrogation >> no intrinsic conduction      Intake/Output Summary (Last 24 hours) at 10/09/2019 0946 Last data filed at 10/09/2019 0900 Gross per 24 hour  Intake 608.4 ml  Output 3725 ml  Net -3116.6 ml    LABS: Basic Metabolic Panel: Recent Labs  Lab 10/03/19 1545 10/05/19 0917 10/05/19 1440 10/05/19 1443 10/05/19 2104 10/06/19 0207 10/06/19 0359 10/06/19 0359 10/06/19 0501 10/06/19 8341 10/06/19 1551 10/06/19 1551 10/07/19 0350 10/07/19 0350 10/08/19 0320 10/09/19 0359  NA 139   < > 141   < > 141   < > 139  --  143 142 132*  --  135  --  134* 134*  K 4.0   < > 4.4   < > 3.9   < > 3.5  --  3.6 4.0 5.0  --  4.5  --  4.0 4.0  CL 105   < > 106  --  111  --  110  --   --   --  106  --  105  --  101 98  CO2 23  --   --   --  20*  --  20*  --   --   --  20*  --  21*  --  25 28  GLUCOSE 133*   < > 91  --  113*  --  117*  --   --   --  177*  --  105*  --  104* 124*  BUN 20   < > 23  --  18  --  16  --   --   --  17  --  19  --  23 17  CREATININE 1.19   < > 0.90  --  1.02  --  1.18  --   --   --  1.28*  --  1.31*  --  1.27* 0.96  CALCIUM 9.4  --   --   --  8.0*  --  8.0*   < >  --   --  8.0*   < > 8.3*   < > 8.4* 8.8*  MG  --   --   --    < > 2.7*  --  2.4  --   --   --  2.3  --   --   --   --   --    < > = values in this interval not displayed.   Cardiac Enzymes: No results for input(s): CKTOTAL, CKMB, CKMBINDEX, TROPONINI in the last 72 hours. CBC: Recent Labs  Lab 10/05/19 1650 10/05/19 2037 10/05/19 2104 10/06/19 0207 10/06/19 0359 10/06/19 0501 10/06/19 2774  10/06/19 1551 10/07/19 0350 10/08/19 0320 10/09/19 0359  WBC 4.5  --  8.8  --  12.6*  --   --  15.7* 15.0* 11.7* 8.1  HGB 11.8*   < > 10.9*   < > 10.5* 10.9* 10.9* 10.1* 9.7* 9.2* 9.1*  HCT 37.4*   < > 34.6*   < > 32.6* 32.0* 32.0* 32.0* 30.4* 29.2* 29.7*  MCV 85.6  --  83.8  --  82.7  --   --  85.1 84.2 84.6 85.1  PLT 75*  --  87*  --  93*  --   --  89* 84* 80* 94*   < > = values in this interval not displayed.   PROTIME: Recent Labs    10/07/19 0350 10/08/19 0320 10/09/19 0359  LABPROT 20.1* 16.3* 16.5*  INR 1.8* 1.4* 1.4*   Liver Function Tests: No results for input(s): AST, ALT, ALKPHOS, BILITOT, PROT, ALBUMIN in the last 72 hours. No results for input(s): LIPASE, AMYLASE in the last 72 hours. BNP: BNP (last 3 results) Recent Labs    07/25/19 0202  BNP 458.3*        ASSESSMENT AND PLAN:  Principal Problem:   S/P redo mitral valve replacement with bioprosthetic valve + tricuspid valve repair + maze procedure Atrial arrhythmias  Complete heart block Coronary artery disease with prior bypass and interval PCI  No recovery as of now, will plan decision in am for possible pacemaker Tues which would be D7 post op   Signed, Virl Axe MD  10/09/2019

## 2019-10-10 ENCOUNTER — Inpatient Hospital Stay (HOSPITAL_COMMUNITY): Payer: Medicare Other

## 2019-10-10 ENCOUNTER — Inpatient Hospital Stay (HOSPITAL_COMMUNITY)
Admission: RE | Disposition: A | Discharge status: Home / Self Care | Payer: Self-pay | Source: Home / Self Care | Attending: Thoracic Surgery (Cardiothoracic Vascular Surgery)

## 2019-10-10 HISTORY — PX: PACEMAKER IMPLANT: EP1218

## 2019-10-10 LAB — COOXEMETRY PANEL
Carboxyhemoglobin: 2 % — ABNORMAL HIGH (ref 0.5–1.5)
Methemoglobin: 1 % (ref 0.0–1.5)
O2 Saturation: 61.9 %
Total hemoglobin: 10.1 g/dL — ABNORMAL LOW (ref 12.0–16.0)

## 2019-10-10 LAB — CBC
HCT: 28.6 % — ABNORMAL LOW (ref 39.0–52.0)
Hemoglobin: 9.1 g/dL — ABNORMAL LOW (ref 13.0–17.0)
MCH: 27 pg (ref 26.0–34.0)
MCHC: 31.8 g/dL (ref 30.0–36.0)
MCV: 84.9 fL (ref 80.0–100.0)
Platelets: 115 10*3/uL — ABNORMAL LOW (ref 150–400)
RBC: 3.37 MIL/uL — ABNORMAL LOW (ref 4.22–5.81)
RDW: 16.1 % — ABNORMAL HIGH (ref 11.5–15.5)
WBC: 6.6 10*3/uL (ref 4.0–10.5)
nRBC: 0 % (ref 0.0–0.2)

## 2019-10-10 LAB — BASIC METABOLIC PANEL
Anion gap: 6 (ref 5–15)
BUN: 15 mg/dL (ref 8–23)
CO2: 31 mmol/L (ref 22–32)
Calcium: 8.7 mg/dL — ABNORMAL LOW (ref 8.9–10.3)
Chloride: 98 mmol/L (ref 98–111)
Creatinine, Ser: 0.88 mg/dL (ref 0.61–1.24)
GFR calc Af Amer: >60 mL/min (ref >60)
GFR calc non Af Amer: >60 mL/min (ref >60)
Glucose, Bld: 112 mg/dL — ABNORMAL HIGH (ref 70–99)
Potassium: 3.8 mmol/L (ref 3.5–5.1)
Sodium: 135 mmol/L (ref 135–145)

## 2019-10-10 LAB — GLUCOSE, CAPILLARY
Glucose-Capillary: 86 mg/dL (ref 70–99)
Glucose-Capillary: 96 mg/dL (ref 70–99)
Glucose-Capillary: 120 mg/dL — ABNORMAL HIGH (ref 70–99)
Glucose-Capillary: 111 mg/dL — ABNORMAL HIGH (ref 70–99)

## 2019-10-10 LAB — PROTIME-INR
INR: 1.4 — ABNORMAL HIGH (ref 0.8–1.2)
Prothrombin Time: 16.9 seconds — ABNORMAL HIGH (ref 11.4–15.2)

## 2019-10-10 SURGERY — PACEMAKER IMPLANT

## 2019-10-10 MED ORDER — FENTANYL CITRATE (PF) 100 MCG/2ML IJ SOLN
INTRAMUSCULAR | Status: AC
  Filled 2019-10-10: qty 2

## 2019-10-10 MED ORDER — SODIUM CHLORIDE 0.9 % IV SOLN: INTRAVENOUS | Status: DC

## 2019-10-10 MED ORDER — LIDOCAINE HCL (PF) 1 % IJ SOLN
INTRAMUSCULAR | Status: DC | PRN
  Administered 2019-10-10: 60 mL

## 2019-10-10 MED ORDER — CEFAZOLIN SODIUM-DEXTROSE 2-4 GM/100ML-% IV SOLN
2.0000 g | INTRAVENOUS | Status: AC
  Administered 2019-10-10: 2 g via INTRAVENOUS
  Filled 2019-10-10: qty 100

## 2019-10-10 MED ORDER — MIDAZOLAM HCL 5 MG/5ML IJ SOLN
INTRAMUSCULAR | Status: AC
  Filled 2019-10-10: qty 5

## 2019-10-10 MED ORDER — CHLORHEXIDINE GLUCONATE 4 % EX LIQD
4.0000 "application " | Freq: Once | CUTANEOUS | Status: AC
  Administered 2019-10-10: 4 via TOPICAL
  Filled 2019-10-10: qty 60

## 2019-10-10 MED ORDER — SODIUM CHLORIDE 0.9 % IV SOLN
INTRAVENOUS | Status: AC
  Filled 2019-10-10: qty 2

## 2019-10-10 MED ORDER — ACETAMINOPHEN 325 MG PO TABS: 325.0000 mg | ORAL_TABLET | ORAL | Status: DC | PRN

## 2019-10-10 MED ORDER — SODIUM CHLORIDE 0.9 % IV SOLN
80.0000 mg | INTRAVENOUS | Status: AC
  Administered 2019-10-10: 80 mg
  Filled 2019-10-10: qty 2

## 2019-10-10 MED ORDER — LIDOCAINE HCL 1 % IJ SOLN
INTRAMUSCULAR | Status: AC
  Filled 2019-10-10: qty 60

## 2019-10-10 MED ORDER — ONDANSETRON HCL 4 MG/2ML IJ SOLN
4.0000 mg | Freq: Four times a day (QID) | INTRAMUSCULAR | Status: DC | PRN
  Administered 2019-10-11: 4 mg via INTRAVENOUS
  Filled 2019-10-10: qty 2

## 2019-10-10 MED ORDER — MIDAZOLAM HCL 5 MG/5ML IJ SOLN
INTRAMUSCULAR | Status: DC | PRN
  Administered 2019-10-10: 2 mg via INTRAVENOUS

## 2019-10-10 MED ORDER — CEFAZOLIN SODIUM-DEXTROSE 1-4 GM/50ML-% IV SOLN
1.0000 g | Freq: Four times a day (QID) | INTRAVENOUS | Status: AC
  Administered 2019-10-10 – 2019-10-11 (×3): 1 g via INTRAVENOUS
  Filled 2019-10-10 (×5): qty 50

## 2019-10-10 MED ORDER — HEPARIN (PORCINE) IN NACL 2000-0.9 UNIT/L-% IV SOLN
INTRAVENOUS | Status: DC | PRN
  Administered 2019-10-10: 500 mL

## 2019-10-10 MED ORDER — CEFAZOLIN SODIUM-DEXTROSE 2-4 GM/100ML-% IV SOLN
INTRAVENOUS | Status: AC
  Filled 2019-10-10: qty 100

## 2019-10-10 MED ORDER — FENTANYL CITRATE (PF) 100 MCG/2ML IJ SOLN
INTRAMUSCULAR | Status: DC | PRN
  Administered 2019-10-10: 25 ug via INTRAVENOUS

## 2019-10-10 MED FILL — Heparin Sodium (Porcine) Inj 1000 Unit/ML: INTRAMUSCULAR | Qty: 10 | Status: AC

## 2019-10-10 MED FILL — Electrolyte-R (PH 7.4) Solution: INTRAVENOUS | Qty: 3000 | Status: AC

## 2019-10-10 MED FILL — Sodium Bicarbonate IV Soln 8.4%: INTRAVENOUS | Qty: 50 | Status: AC

## 2019-10-10 MED FILL — Sodium Chloride IV Soln 0.9%: INTRAVENOUS | Qty: 3000 | Status: AC

## 2019-10-10 MED FILL — Mannitol IV Soln 20%: INTRAVENOUS | Qty: 500 | Status: AC

## 2019-10-10 SURGICAL SUPPLY — 12 items

## 2019-10-10 NOTE — H&P (View-Only) (Signed)
Progress Note  Patient Name: Alan [name] Date of Encounter: 10/10/2019  Primary Cardiologist: Sanda Klein, MD   Subjective   Feels well. No chest pain or sob.  Inpatient Medications    Scheduled Meds:  acetaminophen  1,000 mg Oral Q6H   aspirin EC  81 mg Oral Daily   atorvastatin  40 mg Oral Daily   bisacodyl  10 mg Oral Daily   Or   bisacodyl  10 mg Rectal Daily   Chlorhexidine Gluconate Cloth  6 each Topical Daily   Chlorhexidine Gluconate Cloth  6 each Topical Daily   docusate sodium  200 mg Oral Daily   enoxaparin (LOVENOX) injection  30 mg Subcutaneous QHS   furosemide  40 mg Intravenous BID   gabapentin  300 mg Oral 2 times per day   insulin aspart  0-24 Units Subcutaneous TID AC & HS   pantoprazole  40 mg Oral Daily   potassium chloride  20 mEq Oral BID   sertraline  50 mg Oral QHS   sodium chloride flush  10-40 mL Intracatheter Q12H   warfarin  2.5 mg Oral q1600   Warfarin - Physician Dosing Inpatient   Does not apply q1600   Continuous Infusions:  sodium chloride     lactated ringers     lactated ringers 10 mL/hr at 10/10/19 0900   PRN Meds: metoprolol tartrate, morphine injection, ondansetron (ZOFRAN) IV, oxyCODONE, sodium chloride flush, traMADol   Vital Signs    Vitals:   10/10/19 0500 10/10/19 0600 10/10/19 0700 10/10/19 0900  BP: 99/66 132/79  126/86  Pulse: (!) 102 (!) 109  81  Resp: 16 (!) 27  19  Temp:   98.5 F (36.9 C)   TempSrc:   Oral   SpO2: 94% 96%  96%  Weight: 74.3 kg     Height:        Intake/Output Summary (Last 24 hours) at 10/10/2019 0944 Last data filed at 10/10/2019 0900 Gross per 24 hour  Intake 305.48 ml  Output 2050 ml  Net -1744.52 ml   Filed Weights   10/08/19 0500 10/09/19 0500 10/10/19 0500  Weight: 78.5 kg 76.7 kg 74.3 kg    Telemetry    NSR with ventricular pacing - Personally Reviewed  ECG    none - Personally Reviewed  Physical Exam   GEN: No acute distress.     Neck: No JVD Cardiac: RRR, no murmurs, rubs, or gallops.  Respiratory: Clear to auscultation bilaterally. Sternotomy incision is clean and dry. GI: Soft, nontender, non-distended  MS: No edema; No deformity. Neuro:  Nonfocal  Psych: Normal affect   Labs    Chemistry Recent Labs  Lab 10/03/19 1545 10/05/19 0917 10/08/19 0320 10/09/19 0359 10/10/19 0452  NA 139   < > 134* 134* 135  K 4.0   < > 4.0 4.0 3.8  CL 105   < > 101 98 98  CO2 23   < > 25 28 31   GLUCOSE 133*   < > 104* 124* 112*  BUN 20   < > 23 17 15   CREATININE 1.19   < > 1.27* 0.96 0.88  CALCIUM 9.4   < > 8.4* 8.8* 8.7*  PROT 6.8  --   --   --   --   ALBUMIN 3.9  --   --   --   --   AST 24  --   --   --   --   ALT 22  --   --   --   --  ALKPHOS 56  --   --   --   --   BILITOT 1.4*  --   --   --   --   GFRNONAA >60   < > 56* >60 >60  GFRAA >60   < > >60 >60 >60  ANIONGAP 11   < > 8 8 6    < > = values in this interval not displayed.     Hematology Recent Labs  Lab 10/08/19 0320 10/09/19 0359 10/10/19 0452  WBC 11.7* 8.1 6.6  RBC 3.45* 3.49* 3.37*  HGB 9.2* 9.1* 9.1*  HCT 29.2* 29.7* 28.6*  MCV 84.6 85.1 84.9  MCH 26.7 26.1 27.0  MCHC 31.5 30.6 31.8  RDW 16.3* 16.2* 16.1*  PLT 80* 94* 115*    Cardiac EnzymesNo results for input(s): TROPONINI in the last 168 hours. No results for input(s): TROPIPOC in the last 168 hours.   BNPNo results for input(s): BNP, PROBNP in the last 168 hours.   DDimer No results for input(s): DDIMER in the last 168 hours.   Radiology    DG CHEST PORT 1 VIEW  Result Date: 10/10/2019 CLINICAL DATA:  Post MVR, TVR, and Maze procedure EXAM: PORTABLE CHEST 1 VIEW COMPARISON:  Portable exam 0917 hours compared to 10/07/2019 FINDINGS: Interval removal of mediastinal drain and LEFT jugular catheter. BILATERAL thoracostomy tubes remain. RIGHT arm PICC line tip projecting over SVC. Epicardial pacing wires noted. Normal heart size post MVR, AVR, TVR, and Maze procedure. Slightly  prominent mediastinum consistent with surgery and technique. Scattered atelectasis without pulmonary infiltrate or pleural effusion. Very tiny LEFT apex pneumothorax. No acute osseous findings. IMPRESSION: Postsurgical changes with scattered atelectasis and tiny LEFT apex pneumothorax. Electronically Signed   By: Lavonia Dana M.D.   On: 10/10/2019 09:29    Cardiac Studies   none  Patient Profile     69 y.o. male admitted for redo MVR, now with post op CHB  Assessment & Plan    1. CHB - he has no escape at 30/min today. I have discussed the indications/risks/benefits/goals/expectations and he is willing to proceed.  2. Atrial fib - he is maintaining NSR post op, s/p MAZE. 3. Coags - his INR is subtherapeutic.      For questions or updates, please contact [place] Please consult [email or web address] for contact info under Cardiology/STEMI.    Signed, Cristopher Peru, MD  10/10/2019, 9:44 AM  Patient ID: Alan [name], male   DOB: [birth year], 69 y.o.   MRN: [id number]

## 2019-10-10 NOTE — Plan of Care (Signed)
  Problem: Education: Goal: Knowledge of cardiac device and self-care will improve Outcome: Progressing   Problem: Safety: Goal: Ability to remain free from injury will improve Outcome: Progressing   Problem: Pain Managment: Goal: General experience of comfort will improve Outcome: Progressing   Problem: Clinical Measurements: Goal: Cardiovascular complication will be avoided Outcome: Progressing

## 2019-10-10 NOTE — Progress Notes (Signed)
TCTS Evening Rounds  POD #5 s/p redo MVR/TVr Underwent PPM today No complics Feeling well x cough  BP (!) 124/102   Pulse 80   Temp 98.6 F (37 C) (Oral)   Resp (!) 25   Ht 6' (1.829 m)   Wt 74.3 kg   SpO2 96%   BMI 22.22 kg/m    Upper resp sounds RRR    Intake/Output Summary (Last 24 hours) at 10/10/2019 1848 Last data filed at 10/10/2019 1837 Gross per 24 hour  Intake 303.63 ml  Output 1370 ml  Net -1066.37 ml    A/p: continue present management  CXR in am  Cinzia Devos Z. Orvan Seen, [place]

## 2019-10-10 NOTE — Interval H&P Note (Signed)
History and Physical Interval Note:  10/10/2019 11:19 AM  Alan [name]  has presented today for surgery, with the diagnosis of heart block.  The various methods of treatment have been discussed with the patient and family. After consideration of risks, benefits and other options for treatment, the patient has consented to  Procedure(s): PACEMAKER IMPLANT (N/A) as a surgical intervention.  The patient's history has been reviewed, patient examined, no change in status, stable for surgery.  I have reviewed the patient's chart and labs.  Questions were answered to the patient's satisfaction.     Alan [name]

## 2019-10-10 NOTE — Progress Notes (Addendum)
TCTS DAILY ICU PROGRESS NOTE                   [address]            [place] [address]          [phone]   5 Days Post-Op Procedure(s) (LRB): TRANSESOPHAGEAL ECHOCARDIOGRAM (TEE) (N/A) REDO STERNOTOMY (N/A) MITRAL VALVE (MV) REPLACEMENT USING MOSAIC 29MM VALVE (N/A) TRICUSPID VALVE REPAIR USING MC3 28MM ANNULOPLASTY RING (N/A) MAZE (N/A)  Total Length of Stay:  LOS: 5 days   Subjective: Remains temp pacer dependent Feels pretty well overall  Objective: Vital signs in last 24 hours: Temp:  [98.1 F (36.7 C)-99.2 F (37.3 C)] 99.2 F (37.3 C) (08/16 0400) Pulse Rate:  [73-109] 109 (08/16 0600) Cardiac Rhythm: A-V Sequential paced (08/16 0400) Resp:  [13-27] 27 (08/16 0600) BP: (96-144)/(51-90) 132/79 (08/16 0600) SpO2:  [89 %-98 %] 96 % (08/16 0600) Weight:  [74.3 kg] 74.3 kg (08/16 0500)  Filed Weights   10/08/19 0500 10/09/19 0500 10/10/19 0500  Weight: 78.5 kg 76.7 kg 74.3 kg    Weight change: -2.4 kg   Hemodynamic parameters for last 24 hours:    Intake/Output from previous day: 08/15 0701 - 08/16 0700 In: 295.6 [I.V.:295.6] Out: 2190 [Urine:1750; Chest Tube:440]  Intake/Output this shift: No intake/output data recorded.  Current Meds: Scheduled Meds: . acetaminophen  1,000 mg Oral Q6H  . aspirin EC  81 mg Oral Daily  . atorvastatin  40 mg Oral Daily  . bisacodyl  10 mg Oral Daily   Or  . bisacodyl  10 mg Rectal Daily  . Chlorhexidine Gluconate Cloth  6 each Topical Daily  . Chlorhexidine Gluconate Cloth  6 each Topical Daily  . docusate sodium  200 mg Oral Daily  . enoxaparin (LOVENOX) injection  30 mg Subcutaneous QHS  . furosemide  40 mg Intravenous BID  . gabapentin  300 mg Oral 2 times per day  . insulin aspart  0-24 Units Subcutaneous TID AC & HS  . pantoprazole  40 mg Oral Daily  . potassium chloride  20 mEq Oral BID  . sertraline  50 mg Oral QHS  . sodium chloride flush  10-40 mL Intracatheter Q12H  . warfarin   2.5 mg Oral q1600  . Warfarin - Physician Dosing Inpatient   Does not apply q1600   Continuous Infusions: . sodium chloride    . lactated ringers    . lactated ringers 10 mL/hr at 10/10/19 0600  . milrinone 0.125 mcg/kg/min (10/10/19 0600)   PRN Meds:.metoprolol tartrate, morphine injection, ondansetron (ZOFRAN) IV, oxyCODONE, sodium chloride flush, traMADol  General appearance: alert, cooperative and no distress Heart: regular rate and rhythm Lungs: dim in bases Abdomen: benign Extremities: no significant edema Wound: incis healing well  Lab Results: CBC: Recent Labs    10/09/19 0359 10/10/19 0452  WBC 8.1 6.6  HGB 9.1* 9.1*  HCT 29.7* 28.6*  PLT 94* 115*   BMET:  Recent Labs    10/09/19 0359 10/10/19 0452  NA 134* 135  K 4.0 3.8  CL 98 98  CO2 28 31  GLUCOSE 124* 112*  BUN 17 15  CREATININE 0.96 0.88  CALCIUM 8.8* 8.7*    CMET: Lab Results  Component Value Date   WBC 6.6 10/10/2019   HGB 9.1 (L) 10/10/2019   HCT 28.6 (L) 10/10/2019   PLT 115 (L) 10/10/2019   GLUCOSE 112 (H) 10/10/2019   CHOL 154 03/06/2016  TRIG 71 03/06/2016   HDL 45 03/06/2016   LDLCALC 95 03/06/2016   ALT 22 10/03/2019   AST 24 10/03/2019   NA 135 10/10/2019   K 3.8 10/10/2019   CL 98 10/10/2019   CREATININE 0.88 10/10/2019   BUN 15 10/10/2019   CO2 31 10/10/2019   TSH 1.708 10/18/2014   INR 1.4 (H) 10/10/2019   HGBA1C 6.0 (H) 10/03/2019      PT/INR:  Recent Labs    10/10/19 0452  LABPROT 16.9*  INR 1.4*   Radiology: No results found.   Assessment/Plan: S/P Procedure(s) (LRB): TRANSESOPHAGEAL ECHOCARDIOGRAM (TEE) (N/A) REDO STERNOTOMY (N/A) MITRAL VALVE (MV) REPLACEMENT USING MOSAIC 29MM VALVE (N/A) TRICUSPID VALVE REPAIR USING MC3 28MM ANNULOPLASTY RING (N/A) MAZE (N/A)  Doing well POD#5  1 remains in complete  heart block - plans for PPM placement to be finalized, cont to observe on temp pacer 2 BP a bit variable but mostly in good range, milrinone at  .125 with CO-OX 61.9- cont for now 3 sats good on 2 liters 4 will repeat CXR  today 5 excellent diuresis, may be able to decrease lasix soon 6 CT 440 cc yesterday- leave in place- serosang 7 renal fxn is normal 8 leukocytosis resolved, no fevers 9 thrombocytopenia trend conts to improve 10 INR 1.4 , cont coumadin at same dose till pacer placed 11 BS acceptable control, A1c 6.0 on no meds- will need close f/u as outpatient- cont diet control  John Giovanni PA-C Pager [phone] 10/10/2019 7:32 AM    Agree with above Scheduled for PPM today.  Will make NPO Will d/c milr Will keep CT for now  Ruth Tully [employer]

## 2019-10-10 NOTE — Progress Notes (Signed)
Progress Note  Patient Name: Alan [name] Date of Encounter: 10/10/2019  Primary Cardiologist: Sanda Klein, MD   Subjective   Feels well. No chest pain or sob.  Inpatient Medications    Scheduled Meds: . acetaminophen  1,000 mg Oral Q6H  . aspirin EC  81 mg Oral Daily  . atorvastatin  40 mg Oral Daily  . bisacodyl  10 mg Oral Daily   Or  . bisacodyl  10 mg Rectal Daily  . Chlorhexidine Gluconate Cloth  6 each Topical Daily  . Chlorhexidine Gluconate Cloth  6 each Topical Daily  . docusate sodium  200 mg Oral Daily  . enoxaparin (LOVENOX) injection  30 mg Subcutaneous QHS  . furosemide  40 mg Intravenous BID  . gabapentin  300 mg Oral 2 times per day  . insulin aspart  0-24 Units Subcutaneous TID AC & HS  . pantoprazole  40 mg Oral Daily  . potassium chloride  20 mEq Oral BID  . sertraline  50 mg Oral QHS  . sodium chloride flush  10-40 mL Intracatheter Q12H  . warfarin  2.5 mg Oral q1600  . Warfarin - Physician Dosing Inpatient   Does not apply q1600   Continuous Infusions: . sodium chloride    . lactated ringers    . lactated ringers 10 mL/hr at 10/10/19 0900   PRN Meds: metoprolol tartrate, morphine injection, ondansetron (ZOFRAN) IV, oxyCODONE, sodium chloride flush, traMADol   Vital Signs    Vitals:   10/10/19 0500 10/10/19 0600 10/10/19 0700 10/10/19 0900  BP: 99/66 132/79  126/86  Pulse: (!) 102 (!) 109  81  Resp: 16 (!) 27  19  Temp:   98.5 F (36.9 C)   TempSrc:   Oral   SpO2: 94% 96%  96%  Weight: 74.3 kg     Height:        Intake/Output Summary (Last 24 hours) at 10/10/2019 0944 Last data filed at 10/10/2019 0900 Gross per 24 hour  Intake 305.48 ml  Output 2050 ml  Net -1744.52 ml   Filed Weights   10/08/19 0500 10/09/19 0500 10/10/19 0500  Weight: 78.5 kg 76.7 kg 74.3 kg    Telemetry    NSR with ventricular pacing - Personally Reviewed  ECG    none - Personally Reviewed  Physical Exam   GEN: No acute distress.     Neck: No JVD Cardiac: RRR, no murmurs, rubs, or gallops.  Respiratory: Clear to auscultation bilaterally. Sternotomy incision is clean and dry. GI: Soft, nontender, non-distended  MS: No edema; No deformity. Neuro:  Nonfocal  Psych: Normal affect   Labs    Chemistry Recent Labs  Lab 10/03/19 1545 10/05/19 0917 10/08/19 0320 10/09/19 0359 10/10/19 0452  NA 139   < > 134* 134* 135  K 4.0   < > 4.0 4.0 3.8  CL 105   < > 101 98 98  CO2 23   < > 25 28 31   GLUCOSE 133*   < > 104* 124* 112*  BUN 20   < > 23 17 15   CREATININE 1.19   < > 1.27* 0.96 0.88  CALCIUM 9.4   < > 8.4* 8.8* 8.7*  PROT 6.8  --   --   --   --   ALBUMIN 3.9  --   --   --   --   AST 24  --   --   --   --   ALT 22  --   --   --   --  ALKPHOS 56  --   --   --   --   BILITOT 1.4*  --   --   --   --   GFRNONAA >60   < > 56* >60 >60  GFRAA >60   < > >60 >60 >60  ANIONGAP 11   < > 8 8 6    < > = values in this interval not displayed.     Hematology Recent Labs  Lab 10/08/19 0320 10/09/19 0359 10/10/19 0452  WBC 11.7* 8.1 6.6  RBC 3.45* 3.49* 3.37*  HGB 9.2* 9.1* 9.1*  HCT 29.2* 29.7* 28.6*  MCV 84.6 85.1 84.9  MCH 26.7 26.1 27.0  MCHC 31.5 30.6 31.8  RDW 16.3* 16.2* 16.1*  PLT 80* 94* 115*    Cardiac EnzymesNo results for input(s): TROPONINI in the last 168 hours. No results for input(s): TROPIPOC in the last 168 hours.   BNPNo results for input(s): BNP, PROBNP in the last 168 hours.   DDimer No results for input(s): DDIMER in the last 168 hours.   Radiology    DG CHEST PORT 1 VIEW  Result Date: 10/10/2019 CLINICAL DATA:  Post MVR, TVR, and Maze procedure EXAM: PORTABLE CHEST 1 VIEW COMPARISON:  Portable exam 0917 hours compared to 10/07/2019 FINDINGS: Interval removal of mediastinal drain and LEFT jugular catheter. BILATERAL thoracostomy tubes remain. RIGHT arm PICC line tip projecting over SVC. Epicardial pacing wires noted. Normal heart size post MVR, AVR, TVR, and Maze procedure. Slightly  prominent mediastinum consistent with surgery and technique. Scattered atelectasis without pulmonary infiltrate or pleural effusion. Very tiny LEFT apex pneumothorax. No acute osseous findings. IMPRESSION: Postsurgical changes with scattered atelectasis and tiny LEFT apex pneumothorax. Electronically Signed   By: Lavonia Dana M.D.   On: 10/10/2019 09:29    Cardiac Studies   none  Patient Profile     69 y.o. male admitted for redo MVR, now with post op CHB  Assessment & Plan    1. CHB - he has no escape at 30/min today. I have discussed the indications/risks/benefits/goals/expectations and he is willing to proceed.  2. Atrial fib - he is maintaining NSR post op, s/p MAZE. 3. Coags - his INR is subtherapeutic.      For questions or updates, please contact [place] Please consult [email or web address] for contact info under Cardiology/STEMI.    Signed, Cristopher Peru, MD  10/10/2019, 9:44 AM  Patient ID: [name], male   DOB: [birth year], 69 y.o.   MRN: [id number]

## 2019-10-11 ENCOUNTER — Encounter (HOSPITAL_COMMUNITY): Payer: Self-pay | Admitting: Internal Medicine

## 2019-10-11 ENCOUNTER — Inpatient Hospital Stay (HOSPITAL_COMMUNITY): Payer: Medicare Other

## 2019-10-11 LAB — PROTIME-INR
INR: 2.3 — ABNORMAL HIGH (ref 0.8–1.2)
Prothrombin Time: 24.5 seconds — ABNORMAL HIGH (ref 11.4–15.2)

## 2019-10-11 LAB — GLUCOSE, CAPILLARY
Glucose-Capillary: 106 mg/dL — ABNORMAL HIGH (ref 70–99)
Glucose-Capillary: 120 mg/dL — ABNORMAL HIGH (ref 70–99)
Glucose-Capillary: 118 mg/dL — ABNORMAL HIGH (ref 70–99)
Glucose-Capillary: 97 mg/dL (ref 70–99)
Glucose-Capillary: 158 mg/dL — ABNORMAL HIGH (ref 70–99)

## 2019-10-11 MED ORDER — SODIUM CHLORIDE 0.9% FLUSH: 3.0000 mL | INTRAVENOUS | Status: DC | PRN

## 2019-10-11 MED ORDER — SODIUM CHLORIDE 0.9% FLUSH: 3.0000 mL | Freq: Two times a day (BID) | INTRAVENOUS | Status: DC

## 2019-10-11 MED ORDER — WARFARIN SODIUM 1 MG PO TABS
1.0000 mg | ORAL_TABLET | Freq: Every day | ORAL | Status: DC
  Administered 2019-10-11 – 2019-10-12 (×2): 1 mg via ORAL
  Filled 2019-10-11 (×2): qty 1

## 2019-10-11 MED ORDER — ~~LOC~~ CARDIAC SURGERY, PATIENT & FAMILY EDUCATION: Freq: Once | Status: AC

## 2019-10-11 MED ORDER — SODIUM CHLORIDE 0.9 % IV SOLN: 250.0000 mL | INTRAVENOUS | Status: DC | PRN

## 2019-10-11 MED FILL — Lidocaine HCl Local Inj 1%: INTRAMUSCULAR | Qty: 60 | Status: AC

## 2019-10-11 NOTE — Plan of Care (Signed)
  Problem: Activity: Goal: Risk for activity intolerance will decrease Outcome: Progressing   Problem: Skin Integrity: Goal: Wound healing without signs and symptoms of infection Outcome: Progressing

## 2019-10-11 NOTE — Progress Notes (Signed)
TCTS DAILY ICU PROGRESS NOTE                   [address]            ,[place] [address]          [phone]   1 Day Post-Op Procedure(s) (LRB): PACEMAKER IMPLANT (N/A)  Total Length of Stay:  LOS: 6 days   Subjective: No events PPM placed yesterday Some nausea this am  Objective: Vital signs in last 24 hours: Temp:  [98.2 F (36.8 C)-99.4 F (37.4 C)] 98.7 F (37.1 C) (08/17 0630) Pulse Rate:  [76-87] 86 (08/17 0500) Cardiac Rhythm: Ventricular paced (08/17 0400) Resp:  [0-35] 18 (08/17 0500) BP: (103-155)/(61-109) 139/80 (08/17 0500) SpO2:  [93 %-100 %] 94 % (08/17 0500) Weight:  [72.4 kg] 72.4 kg (08/17 0500)  Filed Weights   10/09/19 0500 10/10/19 0500 10/11/19 0500  Weight: 76.7 kg 74.3 kg 72.4 kg    Weight change: -1.9 kg   Hemodynamic parameters for last 24 hours:    Intake/Output from previous day: 08/16 0701 - 08/17 0700 In: 98 [I.V.:98] Out: 1270 [Urine:950; Chest Tube:320]  Intake/Output this shift: No intake/output data recorded.  Current Meds: Scheduled Meds: . aspirin EC  81 mg Oral Daily  . atorvastatin  40 mg Oral Daily  . bisacodyl  10 mg Oral Daily   Or  . bisacodyl  10 mg Rectal Daily  . Chlorhexidine Gluconate Cloth  6 each Topical Daily  . Chlorhexidine Gluconate Cloth  6 each Topical Daily  . [facility], Patient & Family Education   Does not apply Once  . docusate sodium  200 mg Oral Daily  . enoxaparin (LOVENOX) injection  30 mg Subcutaneous QHS  . furosemide  40 mg Intravenous BID  . gabapentin  300 mg Oral 2 times per day  . insulin aspart  0-24 Units Subcutaneous TID AC & HS  . pantoprazole  40 mg Oral Daily  . potassium chloride  20 mEq Oral BID  . sertraline  50 mg Oral QHS  . sodium chloride flush  10-40 mL Intracatheter Q12H  . sodium chloride flush  3 mL Intravenous Q12H  . warfarin  2.5 mg Oral q1600  . Warfarin - Physician Dosing Inpatient   Does not apply q1600   Continuous  Infusions: . sodium chloride    . sodium chloride    . lactated ringers    . lactated ringers Stopped (10/10/19 1123)   PRN Meds:.sodium chloride, acetaminophen, metoprolol tartrate, morphine injection, ondansetron (ZOFRAN) IV, ondansetron (ZOFRAN) IV, oxyCODONE, sodium chloride flush, sodium chloride flush, traMADol  General appearance: alert, cooperative and no distress Heart: regular rate and rhythm Lungs: dim in bases Abdomen: benign Extremities: no significant edema Wound: incis healing well  Lab Results: CBC: Recent Labs    10/09/19 0359 10/10/19 0452  WBC 8.1 6.6  HGB 9.1* 9.1*  HCT 29.7* 28.6*  PLT 94* 115*   BMET:  Recent Labs    10/09/19 0359 10/10/19 0452  NA 134* 135  K 4.0 3.8  CL 98 98  CO2 28 31  GLUCOSE 124* 112*  BUN 17 15  CREATININE 0.96 0.88  CALCIUM 8.8* 8.7*    CMET: Lab Results  Component Value Date   WBC 6.6 10/10/2019   HGB 9.1 (L) 10/10/2019   HCT 28.6 (L) 10/10/2019   PLT 115 (L) 10/10/2019   GLUCOSE 112 (H) 10/10/2019   CHOL 154 03/06/2016  TRIG 71 03/06/2016   HDL 45 03/06/2016   LDLCALC 95 03/06/2016   ALT 22 10/03/2019   AST 24 10/03/2019   NA 135 10/10/2019   K 3.8 10/10/2019   CL 98 10/10/2019   CREATININE 0.88 10/10/2019   BUN 15 10/10/2019   CO2 31 10/10/2019   TSH 1.708 10/18/2014   INR 2.3 (H) 10/11/2019   HGBA1C 6.0 (H) 10/03/2019      PT/INR:  Recent Labs    10/11/19 0503  LABPROT 24.5*  INR 2.3*   Radiology: DG Chest 2 View  Result Date: 10/11/2019 CLINICAL DATA:  Pacemaker EXAM: CHEST - 2 VIEW COMPARISON:  Radiograph 10/10/2019 FINDINGS: Pacer pack overlies the left chest wall with leads appropriately directed towards the right atrium and ventricle. Evidence of prior bioprosthetic aortic valve replacement with mitral and annular repair. Left atrial occluder noted as well. Right upper extremity PICC tip terminates at the superior cavoatrial junction. Abandoned epicardial pacer wires are noted.  Bilateral pleural drains in place. Telemetry leads overlie the chest. Some atelectatic changes are present towards the lung bases as well as some mild central vascular congestion and probable atelectasis. No visible pneumothorax or effusion. Cardiomediastinal contour similar to prior with cardiomegaly and a calcified aorta. No other acute osseous or soft tissue abnormality. IMPRESSION: 1. Placement of a left chest wall pacer battery pack with intact, well seated leads terminating at the right atrium and directed towards the cardiac apex. 2. 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 3. Additional lines and tubes as above. 4. Mild central vascular congestion and basilar atelectasis. 5.  Aortic Atherosclerosis (ICD10-I70.0). Electronically Signed   By: Lovena Le M.D.   On: 10/11/2019 06:21   EP PPM/ICD IMPLANT  Result Date: 10/10/2019 CONCLUSIONS:  1. Successful implantation of a medtronic dual-chamber pacemaker for symptomatic bradycardia due to complete heart block  2. No early apparent complications.       Cristopher Peru, MD 10/10/2019 12:58 PM   DG CHEST PORT 1 VIEW  Result Date: 10/10/2019 CLINICAL DATA:  Post MVR, TVR, and Maze procedure EXAM: PORTABLE CHEST 1 VIEW COMPARISON:  Portable exam 0917 hours compared to 10/07/2019 FINDINGS: Interval removal of mediastinal drain and LEFT jugular catheter. BILATERAL thoracostomy tubes remain. RIGHT arm PICC line tip projecting over SVC. Epicardial pacing wires noted. Normal heart size post MVR, AVR, TVR, and Maze procedure. Slightly prominent mediastinum consistent with surgery and technique. Scattered atelectasis without pulmonary infiltrate or pleural effusion. Very tiny LEFT apex pneumothorax. No acute osseous findings. IMPRESSION: Postsurgical changes with scattered atelectasis and tiny LEFT apex pneumothorax. Electronically Signed   By: Lavonia Dana M.D.   On: 10/10/2019 09:29     Assessment/Plan: S/P Procedure(s)  (LRB): PACEMAKER IMPLANT (N/A)  Doing well POD#6 s/p redo MVR TVR, POD 1 s/p PPM  Doing well Will remove epicardial wires Will remove CTs Transfer to floor today  Lajuana Matte

## 2019-10-11 NOTE — Progress Notes (Signed)
Mobility Specialist - Progress Note   10/11/19 1649  Mobility  Activity Ambulated in hall  Level of Assistance Minimal assist, patient does 75% or more  Assistive Device Four wheel walker (EVA)  Distance Ambulated (ft) 770 ft  Mobility Response Tolerated well  Mobility performed by Mobility specialist  $Mobility charge 1 Mobility    Pre-mobility: 85 HR, 97%SpO2 During mobility: 95 HR, 95% SpO2 Post-mobility: 89 HR, 98% SpO2  Pt stated he prefers to use EVA walker. He ambulated on 1 L/min and his SpO2 remained at 95% throughout. He was left on 1 L/min of O2 in his bed and his RN was notified.   Pricilla Handler Mobility Specialist Mobility Specialist Phone: [phone]

## 2019-10-11 NOTE — Progress Notes (Addendum)
Electrophysiology Rounding Note  Patient Name: Alan [name] Date of Encounter: 10/11/2019  Primary Cardiologist: Sanda Klein, MD Electrophysiologist: Dr. Quentin Ore   Subjective   S/p Medtronic dual chamber pacer 10/10/2019 by Dr. Lovena Le and Dr. Quentin Ore  The patient is doing well today.  At this time, the patient denies chest pain, shortness of breath, or any new concerns.  Inpatient Medications    Scheduled Meds: . aspirin EC  81 mg Oral Daily  . atorvastatin  40 mg Oral Daily  . bisacodyl  10 mg Oral Daily   Or  . bisacodyl  10 mg Rectal Daily  . Chlorhexidine Gluconate Cloth  6 each Topical Daily  . Chlorhexidine Gluconate Cloth  6 each Topical Daily  . [facility], Patient & Family Education   Does not apply Once  . docusate sodium  200 mg Oral Daily  . enoxaparin (LOVENOX) injection  30 mg Subcutaneous QHS  . furosemide  40 mg Intravenous BID  . gabapentin  300 mg Oral 2 times per day  . insulin aspart  0-24 Units Subcutaneous TID AC & HS  . pantoprazole  40 mg Oral Daily  . potassium chloride  20 mEq Oral BID  . sertraline  50 mg Oral QHS  . sodium chloride flush  10-40 mL Intracatheter Q12H  . sodium chloride flush  3 mL Intravenous Q12H  . warfarin  2.5 mg Oral q1600  . Warfarin - Physician Dosing Inpatient   Does not apply q1600   Continuous Infusions: . sodium chloride    . sodium chloride    . lactated ringers    . lactated ringers Stopped (10/10/19 1123)   PRN Meds: sodium chloride, acetaminophen, metoprolol tartrate, morphine injection, ondansetron (ZOFRAN) IV, ondansetron (ZOFRAN) IV, oxyCODONE, sodium chloride flush, sodium chloride flush, traMADol   Vital Signs    Vitals:   10/11/19 0630 10/11/19 0700 10/11/19 0800 10/11/19 0900  BP:  (!) 81/68    Pulse:  88 92 89  Resp:  17 (!) 31   Temp: 98.7 F (37.1 C)     TempSrc: Oral     SpO2:  96% 97% 94%  Weight:      Height:        Intake/Output Summary (Last 24  hours) at 10/11/2019 0919 Last data filed at 10/11/2019 0900 Gross per 24 hour  Intake 302.44 ml  Output 910 ml  Net -607.56 ml   Filed Weights   10/09/19 0500 10/10/19 0500 10/11/19 0500  Weight: 76.7 kg 74.3 kg 72.4 kg    Physical Exam    GEN- The patient is well appearing, alert and oriented x 3 today.   Head- normocephalic, atraumatic Eyes-  Sclera clear, conjunctiva pink Ears- hearing intact Oropharynx- clear Neck- supple Lungs- Clear to ausculation bilaterally, normal work of breathing Heart- Regular rate and rhythm, no murmurs, rubs or gallops GI- soft, NT, ND, + BS Extremities- no clubbing or cyanosis. No edema Skin- no rash or lesion Psych- euthymic mood, full affect Neuro- strength and sensation are intact  Labs    CBC Recent Labs    10/09/19 0359 10/10/19 0452  WBC 8.1 6.6  HGB 9.1* 9.1*  HCT 29.7* 28.6*  MCV 85.1 84.9  PLT 94* 119*   Basic Metabolic Panel Recent Labs    10/09/19 0359 10/10/19 0452  NA 134* 135  K 4.0 3.8  CL 98 98  CO2 28 31  GLUCOSE 124* 112*  BUN 17 15  CREATININE 0.96 0.88  CALCIUM 8.8* 8.7*   Liver Function Tests No results for input(s): AST, ALT, ALKPHOS, BILITOT, PROT, ALBUMIN in the last 72 hours. No results for input(s): LIPASE, AMYLASE in the last 72 hours. Cardiac Enzymes No results for input(s): CKTOTAL, CKMB, CKMBINDEX, TROPONINI in the last 72 hours.   Telemetry    V pacing in 70-80s (personally reviewed)  Radiology    DG Chest 2 View  Result Date: 10/11/2019 CLINICAL DATA:  Pacemaker EXAM: CHEST - 2 VIEW COMPARISON:  Radiograph 10/10/2019 FINDINGS: Pacer pack overlies the left chest wall with leads appropriately directed towards the right atrium and ventricle. Evidence of prior bioprosthetic aortic valve replacement with mitral and annular repair. Left atrial occluder noted as well. Right upper extremity PICC tip terminates at the superior cavoatrial junction. Abandoned epicardial pacer wires are noted.  Bilateral pleural drains in place. Telemetry leads overlie the chest. Some atelectatic changes are present towards the lung bases as well as some mild central vascular congestion and probable atelectasis. No visible pneumothorax or effusion. Cardiomediastinal contour similar to prior with cardiomegaly and a calcified aorta. No other acute osseous or soft tissue abnormality. IMPRESSION: 1. Placement of a left chest wall pacer battery pack with intact, well seated leads terminating at the right atrium and directed towards the cardiac apex. 2. 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 3. Additional lines and tubes as above. 4. Mild central vascular congestion and basilar atelectasis. 5.  Aortic Atherosclerosis (ICD10-I70.0). Electronically Signed   By: Lovena Le M.D.   On: 10/11/2019 06:21   EP PPM/ICD IMPLANT  Result Date: 10/10/2019 CONCLUSIONS:  1. Successful implantation of a medtronic dual-chamber pacemaker for symptomatic bradycardia due to complete heart block  2. No early apparent complications.       Cristopher Peru, MD 10/10/2019 12:58 PM   DG CHEST PORT 1 VIEW  Result Date: 10/10/2019 CLINICAL DATA:  Post MVR, TVR, and Maze procedure EXAM: PORTABLE CHEST 1 VIEW COMPARISON:  Portable exam 0917 hours compared to 10/07/2019 FINDINGS: Interval removal of mediastinal drain and LEFT jugular catheter. BILATERAL thoracostomy tubes remain. RIGHT arm PICC line tip projecting over SVC. Epicardial pacing wires noted. Normal heart size post MVR, AVR, TVR, and Maze procedure. Slightly prominent mediastinum consistent with surgery and technique. Scattered atelectasis without pulmonary infiltrate or pleural effusion. Very tiny LEFT apex pneumothorax. No acute osseous findings. IMPRESSION: Postsurgical changes with scattered atelectasis and tiny LEFT apex pneumothorax. Electronically Signed   By: Lavonia Dana M.D.   On: 10/10/2019 09:29    Patient Profile     69 y.o. male  admitted for redo MVR, now with post op CHB.  Assessment & Plan    1. CHB s/p Medtronic DDD PPM Stable device interrogation this am.  CXR without pneumothorax Follow up and care instructions reviewed with patient and placed in chart.   2. AF Maintaining NSR post op s/p MAZE INR trending up. Follow closely with recent PPM  EP to see as needed while here. Usual follow up placed in chart and reviewed with patient. Can remove sling later today.  Would leave outer (square) bandage on PPM site until discharge.   For questions or updates, please contact [place] Please consult [email or web address] for contact info under Cardiology/STEMI.  Signed, Shirley Friar, PA-C  10/11/2019, 9:19 AM    ----------------------------------------------------------  I have seen, examined the patient, and reviewed the above assessment and plan.    Patient doing well this morning post dual-chamber pacemaker yesterday.  Pocket without evidence of hematoma.  Device interrogation shows stable lead parameters and chest x-ray without evidence of pneumothorax.  Recommend we remove the sling later today.  EP to see as needed.   Vickie Epley, MD 10/11/2019 11:01 AM

## 2019-10-11 NOTE — Progress Notes (Signed)
EPW's removed per MD order without difficulty.  Vital signs being cycled.  Pt educated on one hour of bed rest.  Will continue to monitor.

## 2019-10-11 NOTE — Progress Notes (Signed)
CARDIAC REHAB PHASE I   PRE:  Rate/Rhythm: 87 paced  BP:  Sitting: 116/75      SaO2: 95 2L  MODE:  Ambulation: 400 ft   POST:  Rate/Rhythm: 99 paced with PVCs  BP:  Sitting: 136/89    SaO2: 91 2L  Pt ambulated 455ft in hallway with EVA. Pt states much improvement in breathing, did require 2L [place] throughout the walk. Pt returned to bed. Encouraged continued ambulation and IS use. Will continue to follow.  [id number] Rufina Falco, RN BSN 10/11/2019 2:46 PM

## 2019-10-12 ENCOUNTER — Encounter (HOSPITAL_COMMUNITY): Payer: Self-pay | Admitting: Thoracic Surgery (Cardiothoracic Vascular Surgery)

## 2019-10-12 LAB — CBC
HCT: 31.6 % — ABNORMAL LOW (ref 39.0–52.0)
Hemoglobin: 9.9 g/dL — ABNORMAL LOW (ref 13.0–17.0)
MCH: 26.4 pg (ref 26.0–34.0)
MCHC: 31.3 g/dL (ref 30.0–36.0)
MCV: 84.3 fL (ref 80.0–100.0)
Platelets: 182 10*3/uL (ref 150–400)
RBC: 3.75 MIL/uL — ABNORMAL LOW (ref 4.22–5.81)
RDW: 16.1 % — ABNORMAL HIGH (ref 11.5–15.5)
WBC: 8.5 10*3/uL (ref 4.0–10.5)
nRBC: 0 % (ref 0.0–0.2)

## 2019-10-12 LAB — PROTIME-INR
INR: 2 — ABNORMAL HIGH (ref 0.8–1.2)
Prothrombin Time: 21.9 seconds — ABNORMAL HIGH (ref 11.4–15.2)

## 2019-10-12 LAB — BASIC METABOLIC PANEL
Anion gap: 9 (ref 5–15)
BUN: 12 mg/dL (ref 8–23)
CO2: 31 mmol/L (ref 22–32)
Calcium: 9.1 mg/dL (ref 8.9–10.3)
Chloride: 94 mmol/L — ABNORMAL LOW (ref 98–111)
Creatinine, Ser: 0.94 mg/dL (ref 0.61–1.24)
GFR calc Af Amer: >60 mL/min (ref >60)
GFR calc non Af Amer: >60 mL/min (ref >60)
Glucose, Bld: 96 mg/dL (ref 70–99)
Potassium: 4.2 mmol/L (ref 3.5–5.1)
Sodium: 134 mmol/L — ABNORMAL LOW (ref 135–145)

## 2019-10-12 LAB — GLUCOSE, CAPILLARY
Glucose-Capillary: 105 mg/dL — ABNORMAL HIGH (ref 70–99)
Glucose-Capillary: 142 mg/dL — ABNORMAL HIGH (ref 70–99)
Glucose-Capillary: 112 mg/dL — ABNORMAL HIGH (ref 70–99)
Glucose-Capillary: 124 mg/dL — ABNORMAL HIGH (ref 70–99)

## 2019-10-12 MED ORDER — COUMADIN BOOK
Freq: Once | Status: AC
  Filled 2019-10-12: qty 1

## 2019-10-12 MED ORDER — FUROSEMIDE 40 MG PO TABS
40.0000 mg | ORAL_TABLET | Freq: Every day | ORAL | Status: DC
  Administered 2019-10-13: 40 mg via ORAL
  Filled 2019-10-12: qty 1

## 2019-10-12 MED ORDER — POTASSIUM CHLORIDE CRYS ER 20 MEQ PO TBCR
20.0000 meq | EXTENDED_RELEASE_TABLET | Freq: Every day | ORAL | Status: DC
  Administered 2019-10-13: 20 meq via ORAL
  Filled 2019-10-12: qty 1

## 2019-10-12 NOTE — Progress Notes (Addendum)
Mobility Specialist - Progress Note   10/12/19 1342  Mobility  Activity Ambulated in hall  Level of Assistance Modified independent, requires aide device or extra time  Assistive Device Front wheel walker  Distance Ambulated (ft) 770 ft  Mobility Response Tolerated well  Mobility performed by Mobility specialist  $Mobility charge 1 Mobility    Pre-mobility: 93 HR, 97% SpO2 During mobility: 100 HR, 95% SpO2 Post-mobility: 107 HR, 97% SpO2  Pt ambulated on 2L/min of O2, he used IS prior to ambulating. He had one brief episode of lightheadedness, stating he felt like he was looking at his feet and back up to where he was looking too rapidly. The lightheadedness resolved w/ a standing rest break.   Pricilla Handler Mobility Specialist Mobility Specialist Phone: [phone]

## 2019-10-12 NOTE — Progress Notes (Signed)
CARDIAC REHAB PHASE I   PRE:  Rate/Rhythm: 97 paced  BP:  Sitting: 111/67      SaO2: 90 2L  MODE:  Ambulation: 400 ft   POST:  Rate/Rhythm: 109 paced with PVCs  BP:  Sitting: 131/98    SaO2: 87 RA --> 96 2L  Pt ambulated 478ft in hallway standby assist with front wheel walker. Pt took one short standing rest break c/o pacemaker incision pain. Attempted to walk pt on RA, but pt unable to maintain sats. Pt helped to BR than returned to recliner. Pt demonstrating 1250 on IS, encouraged continued ambulation and IS use. Will continue to follow.  [id number] Rufina Falco, RN BSN 10/12/2019 9:39 AM

## 2019-10-12 NOTE — Progress Notes (Signed)
Mobility Specialist - Progress Note   10/12/19 1615  Mobility  Activity Ambulated in hall  Level of Assistance Contact guard assist, steadying assist  Assistive Device Front wheel walker  Distance Ambulated (ft) 470 ft  Mobility Response Tolerated fair  Mobility performed by Mobility specialist  $Mobility charge 1 Mobility    Pre-mobility: 95 HR, 95% SpO2 During mobility: 100 HR, 92% SpO2 Post-mobility: 107 HR, 97% SpO2  Pt ambulated on 2 L/min. He says he has been using his IS, though he admits not as much as he should, I encouraged continued usage. Pt endorsed some lightheadedness while ambulating.   Pricilla Handler Mobility Specialist Mobility Specialist Phone: [phone]

## 2019-10-12 NOTE — Plan of Care (Signed)
  Problem: Activity: Goal: Risk for activity intolerance will decrease Outcome: Progressing   

## 2019-10-12 NOTE — Progress Notes (Addendum)
[address]       [place] [address]             [phone]      2 Days Post-Op Procedure(s) (LRB): PACEMAKER IMPLANT (N/A) Subjective: Feels pretty well, ambulation, energy appetite all improving  Objective: Vital signs in last 24 hours: Temp:  [97.8 F (36.6 C)-98.9 F (37.2 C)] 98.6 F (37 C) (08/18 0442) Pulse Rate:  [80-98] 92 (08/18 0442) Cardiac Rhythm: Ventricular paced (08/17 2000) Resp:  [15-31] 18 (08/18 0442) BP: (106-131)/(66-86) 107/73 (08/18 0442) SpO2:  [90 %-98 %] 93 % (08/18 0442) Weight:  [69.9 kg] 69.9 kg (08/18 0443)  Hemodynamic parameters for last 24 hours:    Intake/Output from previous day: 08/17 0701 - 08/18 0700 In: 240 [P.O.:240] Out: 900 [Urine:900] Intake/Output this shift: No intake/output data recorded.  General appearance: alert, cooperative and no distress Heart: regular rate and rhythm and v paced Lungs: reduced in bases Abdomen: benign Extremities: no edema Wound: incis healing well  Lab Results: Recent Labs    10/10/19 0452 10/12/19 0434  WBC 6.6 8.5  HGB 9.1* 9.9*  HCT 28.6* 31.6*  PLT 115* 182   BMET:  Recent Labs    10/10/19 0452 10/12/19 0434  NA 135 134*  K 3.8 4.2  CL 98 94*  CO2 31 31  GLUCOSE 112* 96  BUN 15 12  CREATININE 0.88 0.94  CALCIUM 8.7* 9.1    PT/INR:  Recent Labs    10/12/19 0434  LABPROT 21.9*  INR 2.0*   ABG    Component Value Date/Time   PHART 7.357 10/06/2019 0638   HCO3 20.9 10/06/2019 0638   TCO2 22 10/06/2019 0638   ACIDBASEDEF 4.0 (H) 10/06/2019 0638   O2SAT 61.9 10/10/2019 0454   CBG (last 3)  Recent Labs    10/11/19 1617 10/11/19 2120 10/12/19 0651  GLUCAP 97 158* 105*    Meds Scheduled Meds: . aspirin EC  81 mg Oral Daily  . atorvastatin  40 mg Oral Daily  . bisacodyl  10 mg Oral Daily   Or  . bisacodyl  10 mg Rectal Daily  . Chlorhexidine Gluconate Cloth  6 each Topical Daily  . Chlorhexidine Gluconate Cloth  6 each Topical  Daily  . docusate sodium  200 mg Oral Daily  . furosemide  40 mg Intravenous BID  . gabapentin  300 mg Oral 2 times per day  . insulin aspart  0-24 Units Subcutaneous TID AC & HS  . pantoprazole  40 mg Oral Daily  . potassium chloride  20 mEq Oral BID  . sertraline  50 mg Oral QHS  . sodium chloride flush  10-40 mL Intracatheter Q12H  . sodium chloride flush  3 mL Intravenous Q12H  . warfarin  1 mg Oral q1600  . Warfarin - Physician Dosing Inpatient   Does not apply q1600   Continuous Infusions: . sodium chloride    . sodium chloride    . lactated ringers    . lactated ringers Stopped (10/10/19 1123)   PRN Meds:.sodium chloride, acetaminophen, metoprolol tartrate, morphine injection, ondansetron (ZOFRAN) IV, ondansetron (ZOFRAN) IV, oxyCODONE, sodium chloride flush, sodium chloride flush, traMADol  Xrays DG Chest 2 View  Result Date: 10/11/2019 CLINICAL DATA:  Pacemaker EXAM: CHEST - 2 VIEW COMPARISON:  Radiograph 10/10/2019 FINDINGS: Pacer pack overlies the left chest wall with leads appropriately directed towards the right atrium and ventricle. Evidence of prior bioprosthetic aortic valve replacement with mitral  and annular repair. Left atrial occluder noted as well. Right upper extremity PICC tip terminates at the superior cavoatrial junction. Abandoned epicardial pacer wires are noted. Bilateral pleural drains in place. Telemetry leads overlie the chest. Some atelectatic changes are present towards the lung bases as well as some mild central vascular congestion and probable atelectasis. No visible pneumothorax or effusion. Cardiomediastinal contour similar to prior with cardiomegaly and a calcified aorta. No other acute osseous or soft tissue abnormality. IMPRESSION: 1. Placement of a left chest wall pacer battery pack with intact, well seated leads terminating at the right atrium and directed towards the cardiac apex. 2. Prior bioprosthetic aortic valve replacement as well as mitral and  annular repair. 3. Additional lines and tubes as above. 4. Mild central vascular congestion and basilar atelectasis. 5.  Aortic Atherosclerosis (ICD10-I70.0). Electronically Signed   By: Lovena Le M.D.   On: 10/11/2019 06:21   EP PPM/ICD IMPLANT  Result Date: 10/10/2019 CONCLUSIONS:  1. Successful implantation of a medtronic dual-chamber pacemaker for symptomatic bradycardia due to complete heart block  2. No early apparent complications.       Cristopher Peru, MD 10/10/2019 12:58 PM   DG CHEST PORT 1 VIEW  Result Date: 10/10/2019 CLINICAL DATA:  Post MVR, TVR, and Maze procedure EXAM: PORTABLE CHEST 1 VIEW COMPARISON:  Portable exam 0917 hours compared to 10/07/2019 FINDINGS: Interval removal of mediastinal drain and LEFT jugular catheter. BILATERAL thoracostomy tubes remain. RIGHT arm PICC line tip projecting over SVC. Epicardial pacing wires noted. Normal heart size post MVR, AVR, TVR, and Maze procedure. Slightly prominent mediastinum consistent with surgery and technique. Scattered atelectasis without pulmonary infiltrate or pleural effusion. Very tiny LEFT apex pneumothorax. No acute osseous findings. IMPRESSION: Postsurgical changes with scattered atelectasis and tiny LEFT apex pneumothorax. Electronically Signed   By: Lavonia Dana M.D.   On: 10/10/2019 09:29    Assessment/Plan: S/P Procedure(s) (LRB): PACEMAKER IMPLANT (N/A)          CARDIOTHORACIC SURGERY OPERATIVE NOTE  Date of Procedure:                10/05/2019  Preoperative Diagnosis:        Severe Mitral Regurgitation  Moderate Tricuspid Regurgitation  Recurrent Persistent Atrial Fibrillation and Atrial Flutter  Postoperative Diagnosis:      Severe Mitral Regurgitation  Moderate-severe Tricuspid Regurgitation  Recurrent Persistent Atrial Fibrillation and Atrial Flutter   Procedure:        Mitral Valve Replacement              Redo median sternotomy             Medtronic Mosaic Stented Porcine  Bioprosthetic Tissue Valve (size 18mm, model #310, serial #[id number])   Tricuspid Valve Repair              Edwards mc3 ring annuloplasty (size 36mm, model #4900, serial #[id number])   Maze Procedure              complete bilateral atrial lesion set using cryothermy and bipolar radiofrequency ablation        1 conts to do well POD#6 2 V pacing in 90's 3 afebrile, VSS 4 sats good on 2 literscont pulm toilet for Atx , wean as able 5 good UOP, decrease lasix to po daily from BID IV, normal renal fxn 6 anemia is improved with diuresis/equilabration 7 cont coumadin at 1 mg for INR 2.0 8 thrombocytopenia- resolved 9 BS controlled 10 dispo- hopefully syable for d/c in am  LOS: 7 days    John Giovanni PA-C Pager [phone] 10/12/2019   Overall doing well. Ambulated with physical therapy today. INR therapeutic Dispo planning.  Mercades Bajaj Bary Leriche

## 2019-10-12 NOTE — Research (Signed)
TRAC-AF Registry Informed Consent   Subject Name: Alan [name]  Subject met inclusion and exclusion criteria.  The informed consent form, registry requirements and expectations were reviewed with the subject and questions and concerns were addressed prior to the signing of the consent form.  The subject verbalized understanding of the registry requirements.  The subject agreed to participate in the [place] and signed the informed consent.  The informed consent was obtained prior to performance of any protocol-specific procedures for the subject.  A copy of the signed informed consent was given to the subject and a copy was placed in the subject's medical record.  [name] 10/12/2019, 2:55 PM

## 2019-10-13 LAB — GLUCOSE, CAPILLARY
Glucose-Capillary: 102 mg/dL — ABNORMAL HIGH (ref 70–99)
Glucose-Capillary: 118 mg/dL — ABNORMAL HIGH (ref 70–99)

## 2019-10-13 LAB — PROTIME-INR
INR: 1.6 — ABNORMAL HIGH (ref 0.8–1.2)
Prothrombin Time: 18.6 seconds — ABNORMAL HIGH (ref 11.4–15.2)

## 2019-10-13 MED ORDER — WARFARIN SODIUM 5 MG PO TABS: 2.5000 mg | ORAL_TABLET | Freq: Every day | ORAL | 1 refills | Status: DC

## 2019-10-13 MED ORDER — SERTRALINE HCL 50 MG PO TABS: 50.0000 mg | ORAL_TABLET | Freq: Every day | ORAL | 0 refills | Status: DC

## 2019-10-13 MED ORDER — WARFARIN SODIUM 2.5 MG PO TABS: 2.5000 mg | ORAL_TABLET | Freq: Every day | ORAL | Status: DC

## 2019-10-13 MED ORDER — ASPIRIN 81 MG PO TBEC: 81.0000 mg | DELAYED_RELEASE_TABLET | Freq: Every day | ORAL | Status: DC

## 2019-10-13 MED ORDER — TRAMADOL HCL 50 MG PO TABS: 50.0000 mg | ORAL_TABLET | Freq: Four times a day (QID) | ORAL | 0 refills | Status: DC | PRN

## 2019-10-13 NOTE — Progress Notes (Signed)
CARDIAC REHAB PHASE I   D/c education completed with pt. Pt educated on importance of site care and monitoring incision daily. Encouraged continued IS use, walks, and sternal precautions. Pt given in-the-tube sheet along with heart healthy diet. Reviewed restrictions and exercise guidelines. Will refer to CRP II [place]. Pt is interested in participating in Virtual Cardiac and Pulmonary Rehab. Pt advised that Virtual Cardiac and Pulmonary Rehab is provided at no cost to the patient.  Checklist:  1. Pt has smart device  ie smartphone and/or ipad for downloading an app  Yes 2. Reliable internet/wifi service    Yes 3. Understands how to use their smartphone and navigate within an app.  Yes  Pt verbalized understanding and is in agreement.  [id number] Rufina Falco, RN BSN 10/13/2019 1:33 PM

## 2019-10-13 NOTE — Progress Notes (Signed)
CARDIAC REHAB PHASE I   PRE:  Rate/Rhythm: 99 paced   BP:  Sitting: 117/82      SaO2: 92 RA  MODE:  Ambulation: 400 ft   POST:  Rate/Rhythm: 105 paced with PVCs  BP:  Sitting: 145/82    SaO2: 90 RA  Pt ambulated 485ft in hallway independently with front wheel walker. Pt states he feels stronger today. Pt able to maintain sats on RA. Encouraged sitting in chair, IS use, and ambulation. Pt for possible d/c later today if able to maintain sats. Will continue to follow.  [id number] Rufina Falco, RN BSN 10/13/2019 9:57 AM

## 2019-10-13 NOTE — Progress Notes (Signed)
Patient and family given discharge instructions, medication list, and follow up appointments. Patient picc line was removed and telemetry. All questions were answered. Jadene Pierini [facility] made aware of patient need for zoloft prescription stated would send to patient personal pharmacy. Patient updated. Will discharge home as ordered. Raysean Graumann, Bettina Gavia RN

## 2019-10-13 NOTE — Progress Notes (Addendum)
[address]       [place] [address]             [phone]      3 Days Post-Op Procedure(s) (LRB): PACEMAKER IMPLANT (N/A) Subjective: conts to look and feel well O2 - desats when off to upper 80's, good on 1 liter, denies significant SOB/DOE Objective: Vital signs in last 24 hours: Temp:  [98 F (36.7 C)-98.8 F (37.1 C)] 98.2 F (36.8 C) (08/19 0347) Pulse Rate:  [86-97] 86 (08/19 0347) Cardiac Rhythm: Ventricular paced (08/18 1900) Resp:  [16-20] 16 (08/19 0347) BP: (105-126)/(59-82) 105/59 (08/19 0347) SpO2:  [93 %-97 %] 97 % (08/19 0347) Weight:  [69.2 kg] 69.2 kg (08/19 0459)  Hemodynamic parameters for last 24 hours:    Intake/Output from previous day: 08/18 0701 - 08/19 0700 In: 480 [P.O.:480] Out: 1 [Urine:1] Intake/Output this shift: No intake/output data recorded.  General appearance: alert, cooperative and no distress Heart: regular rate and rhythm and soft systolic murmur Lungs: mildly dim in bases Abdomen: benign Extremities: no edema Wound: incis healing well  Lab Results: Recent Labs    10/12/19 0434  WBC 8.5  HGB 9.9*  HCT 31.6*  PLT 182   BMET:  Recent Labs    10/12/19 0434  NA 134*  K 4.2  CL 94*  CO2 31  GLUCOSE 96  BUN 12  CREATININE 0.94  CALCIUM 9.1    PT/INR:  Recent Labs    10/13/19 0500  LABPROT 18.6*  INR 1.6*   ABG    Component Value Date/Time   PHART 7.357 10/06/2019 0638   HCO3 20.9 10/06/2019 0638   TCO2 22 10/06/2019 0638   ACIDBASEDEF 4.0 (H) 10/06/2019 0638   O2SAT 61.9 10/10/2019 0454   CBG (last 3)  Recent Labs    10/12/19 1053 10/12/19 1622 10/12/19 2126  GLUCAP 142* 112* 124*    Meds Scheduled Meds: . aspirin EC  81 mg Oral Daily  . atorvastatin  40 mg Oral Daily  . bisacodyl  10 mg Oral Daily   Or  . bisacodyl  10 mg Rectal Daily  . Chlorhexidine Gluconate Cloth  6 each Topical Daily  . Chlorhexidine Gluconate Cloth  6 each Topical Daily  . docusate  sodium  200 mg Oral Daily  . furosemide  40 mg Oral Daily  . gabapentin  300 mg Oral 2 times per day  . insulin aspart  0-24 Units Subcutaneous TID AC & HS  . pantoprazole  40 mg Oral Daily  . potassium chloride  20 mEq Oral Daily  . sertraline  50 mg Oral QHS  . sodium chloride flush  10-40 mL Intracatheter Q12H  . sodium chloride flush  3 mL Intravenous Q12H  . warfarin  1 mg Oral q1600  . Warfarin - Physician Dosing Inpatient   Does not apply q1600   Continuous Infusions: . sodium chloride    . sodium chloride    . lactated ringers    . lactated ringers Stopped (10/10/19 1123)   PRN Meds:.sodium chloride, acetaminophen, metoprolol tartrate, morphine injection, ondansetron (ZOFRAN) IV, ondansetron (ZOFRAN) IV, oxyCODONE, sodium chloride flush, sodium chloride flush, traMADol  Xrays No results found.  Assessment/Plan: S/P Procedure(s) (LRB): PACEMAKER IMPLANT (N/A)  1 conts to do well, POD#7 2 afeb, VSS 3 sats good on 1 liter, upper 80's with off 4 INR 1.6, will increase to 2.5 mg 5 BS fine- d/c SSI/CBG 6 will ambulate this am and  if ok off O2 may be able to d/c later today v tomorrow   LOS: 8 days    John Giovanni PA-C Pager [phone] 10/13/2019     wean off O2 before discharging patient Last cxr w/o effusion Cont po lasix, ambulation, IS    patient examined and medical record reviewed,agree with above note. Tharon Aquas Trigt III 10/13/2019

## 2019-10-13 NOTE — Progress Notes (Signed)
Patient education done on IS, patient verbalized understanding. Will monitor . Alan [name], [employer] rN

## 2019-10-14 ENCOUNTER — Telehealth (HOSPITAL_COMMUNITY): Payer: Self-pay

## 2019-10-14 NOTE — Telephone Encounter (Signed)
Called patient to see if he is interested in the Cardiac Rehab Program. Patient stated not at this time. ° °Closed referral °

## 2019-10-14 NOTE — Telephone Encounter (Signed)
Pt insurance is active and benefits verified through Medicare A/B. Co-pay $0.00, DED $203.00/$203.00 met, out of pocket $0.00/$0.00 met, co-insurance 20%. No pre-authorization required. Passport, 10/14/19 @ 1:49PM, OOI#[id number]  2ndary insurance is active and benefits verified through [place]. Co-pay $0.00, DED $0.00/$0.00 met, out of pocket $0.00/$0.00 met, co-insurance 0%. No pre-authorization required. Passport, 10/14/19 @ 1:52PM, PPH#[id number]  Will contact patient to see if he is interested in the Cardiac Rehab Program. If interested, patient will need to complete follow up appt. Once completed, patient will be contacted for scheduling upon review by the RN Navigator.

## 2019-10-17 ENCOUNTER — Ambulatory Visit (INDEPENDENT_AMBULATORY_CARE_PROVIDER_SITE_OTHER): Payer: Medicare Other | Admitting: *Deleted

## 2019-10-17 ENCOUNTER — Other Ambulatory Visit: Payer: Self-pay

## 2019-10-17 DIAGNOSIS — Z5181 Encounter for therapeutic drug level monitoring: Secondary | ICD-10-CM | POA: Insufficient documentation

## 2019-10-17 DIAGNOSIS — I480 Paroxysmal atrial fibrillation: Secondary | ICD-10-CM

## 2019-10-17 DIAGNOSIS — Z953 Presence of xenogenic heart valve: Secondary | ICD-10-CM | POA: Diagnosis not present

## 2019-10-17 LAB — POCT INR: INR: 1.6 — AB (ref 2.0–3.0)

## 2019-10-17 NOTE — Patient Instructions (Addendum)
A full discussion of the nature of anticoagulants has been carried out.  A benefit risk analysis has been presented to the patient, so that they understand the justification for choosing anticoagulation at this time. The need for frequent and regular monitoring, precise dosage adjustment and compliance is stressed.  Side effects of potential bleeding are discussed.  The patient should avoid any OTC items containing aspirin or ibuprofen, and should avoid great swings in general diet.  Avoid alcohol consumption.  Call if any signs of abnormal bleeding.   Description   Today take 1 tablet then continue taking Warfarin 1/2 tablet daily. Recheck INR in 1 week. [facility] [phone] Main [phone]

## 2019-10-18 ENCOUNTER — Ambulatory Visit (INDEPENDENT_AMBULATORY_CARE_PROVIDER_SITE_OTHER): Payer: Self-pay

## 2019-10-18 DIAGNOSIS — Z4802 Encounter for removal of sutures: Secondary | ICD-10-CM

## 2019-10-18 NOTE — Progress Notes (Signed)
Patient arrived for nurse visit to remove sutures post- procedure MV Replacement with Dr. Roxy Manns 10/05/19.  Upon observation all incisions were well approximated. Four sutures removed with no signs/ symptoms of infection noted.  All incision sites after suture removal were well approximated with scant drainage from two of the four chest tube incisions.  Patient tolerated procedure well.  Patient/ family instructed to keep the incision sites clean and dry. Patient advised that he could take a shower and pat the incisions dry.  Patient/ family acknowledged instructions given.   Patient expressed back pain/ spasms that have continued since before surgery.  He stated that it wakes him up at night and he is not resting fully. Patient advised to contact his PCP who manages his pain.  Did advised that he could take over the counter Benadryl 25 mg tablet before bed to help with sleep, but not in combination with Unisom which he already takes.  He acknowledged receipt.  Patient otherwise seemed to be recovering well after surgery.

## 2019-10-19 ENCOUNTER — Ambulatory Visit: Payer: Medicare Other | Admitting: Cardiovascular Disease

## 2019-10-20 ENCOUNTER — Ambulatory Visit (INDEPENDENT_AMBULATORY_CARE_PROVIDER_SITE_OTHER): Payer: Medicare Other | Admitting: Emergency Medicine

## 2019-10-20 ENCOUNTER — Other Ambulatory Visit: Payer: Self-pay

## 2019-10-20 DIAGNOSIS — I480 Paroxysmal atrial fibrillation: Secondary | ICD-10-CM | POA: Diagnosis not present

## 2019-10-20 DIAGNOSIS — I442 Atrioventricular block, complete: Secondary | ICD-10-CM

## 2019-10-20 DIAGNOSIS — Z950 Presence of cardiac pacemaker: Secondary | ICD-10-CM

## 2019-10-20 LAB — CUP PACEART INCLINIC DEVICE CHECK
Battery Remaining Longevity: 136 mo
Battery Voltage: 3.21 V
Brady Statistic AP VP Percent: 1.26 %
Brady Statistic AP VS Percent: 0 %
Brady Statistic AS VP Percent: 98.47 %
Brady Statistic AS VS Percent: 0.26 %
Brady Statistic RA Percent Paced: 1.31 %
Brady Statistic RV Percent Paced: 99.73 %
Date Time Interrogation Session: 20210826102816
Implantable Lead Implant Date: 20210816
Implantable Lead Implant Date: 20210816
Implantable Lead Location: 753859
Implantable Lead Location: 753860
Implantable Lead Model: 3830
Implantable Lead Model: 5076
Implantable Pulse Generator Implant Date: 20210816
Lead Channel Impedance Value: 380 Ohm
Lead Channel Impedance Value: 551 Ohm
Lead Channel Impedance Value: 570 Ohm
Lead Channel Impedance Value: 798 Ohm
Lead Channel Pacing Threshold Amplitude: 0.75 V
Lead Channel Pacing Threshold Amplitude: 1.25 V
Lead Channel Pacing Threshold Pulse Width: 0.4 ms
Lead Channel Pacing Threshold Pulse Width: 0.4 ms
Lead Channel Sensing Intrinsic Amplitude: 6.75 mV
Lead Channel Setting Pacing Amplitude: 3.5 V
Lead Channel Setting Pacing Amplitude: 3.5 V
Lead Channel Setting Pacing Pulse Width: 0.4 ms
Lead Channel Setting Sensing Sensitivity: 0.9 mV

## 2019-10-20 NOTE — Progress Notes (Signed)
Wound check appointment. Steri-strips removed prior to visit by patient. Wound without redness or edema. Incision edges approximated, wound well healed. Normal device function. Thresholds, sensing, and impedances consistent with implant measurements. Device programmed at 3.5V with auto capture on for extra safety margin until 3 month visit. RA/RV high threshold alerts enabled, avg V rate during AT/AF alert turned off. Histogram distribution appropriate for patient and level of activity. No mode switches or high ventricular rates noted. Patient educated about wound care, arm mobility, lifting restrictions, and Carelink monitor. Carelink on 01/10/20 and ROV with Dr. Quentin Ore on 01/16/20.

## 2019-10-24 ENCOUNTER — Other Ambulatory Visit: Payer: Self-pay

## 2019-10-24 ENCOUNTER — Ambulatory Visit (INDEPENDENT_AMBULATORY_CARE_PROVIDER_SITE_OTHER): Payer: Medicare Other

## 2019-10-24 ENCOUNTER — Telehealth: Payer: Self-pay | Admitting: Cardiovascular Disease

## 2019-10-24 DIAGNOSIS — Z5181 Encounter for therapeutic drug level monitoring: Secondary | ICD-10-CM | POA: Diagnosis not present

## 2019-10-24 DIAGNOSIS — Z953 Presence of xenogenic heart valve: Secondary | ICD-10-CM | POA: Diagnosis not present

## 2019-10-24 DIAGNOSIS — I480 Paroxysmal atrial fibrillation: Secondary | ICD-10-CM

## 2019-10-24 LAB — POCT INR: INR: 1.2 — AB (ref 2.0–3.0)

## 2019-10-24 NOTE — Telephone Encounter (Signed)
Hey I did not see that you tried to contact him, but wanted to check before calling him back and telling him.   Thanks!

## 2019-10-24 NOTE — Patient Instructions (Signed)
Today take 2 tablets then continue taking Warfarin 1/2 tablet daily except 1 tablet on Fridays. Recheck INR in 2 week. [facility] [phone] Main [phone]

## 2019-10-24 NOTE — Telephone Encounter (Signed)
Follow Up:     Pt said he was returning a call from today, but did not know who called him.

## 2019-10-24 NOTE — Telephone Encounter (Signed)
Unaware of who tried to contact the patient.

## 2019-10-27 ENCOUNTER — Encounter: Payer: Self-pay | Admitting: Cardiology

## 2019-10-27 ENCOUNTER — Ambulatory Visit (INDEPENDENT_AMBULATORY_CARE_PROVIDER_SITE_OTHER): Payer: Medicare Other | Admitting: Cardiology

## 2019-10-27 ENCOUNTER — Other Ambulatory Visit: Payer: Self-pay

## 2019-10-27 VITALS — BP 124/82 | HR 89 | Ht 72.0 in | Wt 149.0 lb

## 2019-10-27 DIAGNOSIS — I2510 Atherosclerotic heart disease of native coronary artery without angina pectoris: Secondary | ICD-10-CM

## 2019-10-27 DIAGNOSIS — Z79899 Other long term (current) drug therapy: Secondary | ICD-10-CM

## 2019-10-27 DIAGNOSIS — I480 Paroxysmal atrial fibrillation: Secondary | ICD-10-CM

## 2019-10-27 DIAGNOSIS — Z8679 Personal history of other diseases of the circulatory system: Secondary | ICD-10-CM

## 2019-10-27 DIAGNOSIS — Z7901 Long term (current) use of anticoagulants: Secondary | ICD-10-CM

## 2019-10-27 DIAGNOSIS — Z9889 Other specified postprocedural states: Secondary | ICD-10-CM | POA: Diagnosis not present

## 2019-10-27 DIAGNOSIS — Z951 Presence of aortocoronary bypass graft: Secondary | ICD-10-CM | POA: Diagnosis not present

## 2019-10-27 DIAGNOSIS — Z953 Presence of xenogenic heart valve: Secondary | ICD-10-CM

## 2019-10-27 DIAGNOSIS — Z950 Presence of cardiac pacemaker: Secondary | ICD-10-CM

## 2019-10-27 DIAGNOSIS — Z9861 Coronary angioplasty status: Secondary | ICD-10-CM

## 2019-10-27 NOTE — Assessment & Plan Note (Signed)
Patent OM1 and LPDA stents, 80% non dominant RCA July 2021

## 2019-10-27 NOTE — Assessment & Plan Note (Signed)
29 mm Medtronic Mosaic stented porcine bioprosthetic tissue MV and TV repair 10/05/2019

## 2019-10-27 NOTE — Assessment & Plan Note (Signed)
MDT PTVDP 10/13/2019 secondary to post op CHB

## 2019-10-27 NOTE — Assessment & Plan Note (Signed)
23 mm [facility] Ease bovine pericardial bioprosthetic tissue valve with bovine pericardial patch enlargement of aortic root 2016

## 2019-10-27 NOTE — Progress Notes (Signed)
Cardiology Office Note:    Date:  10/27/2019   ID:  Alan [name], DOB 08-12-1950, MRN [id number]  PCP:  Lyman Bishop, DO  Cardiologist:  Sanda Klein, MD  Electrophysiologist:  None   Referring MD: No ref. provider found   No chief complaint on file. Seen today s/p Tissue MV replacement and TV repair   History of Present Illness:    Alan [name] is a 69 y.o. 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  He underwent tissue AVR and MVR repair as well as an LIMA to LAD.  In January 2020 he had atrial flutter with 2-1 AV block.  He had exertional dyspnea.  He underwent cardioversion in February 2020 and was hypotensive postop.  He did not respond to IV fluids.  TEE done then showed severe mitral insufficiency and he underwent diagnostic catheterization.  This was followed by placement of stents in the native LAD upstream of the LIMA bypass into the left circumflex.  He improved after this and a repeat echocardiogram in August 2020 showed good LV function and an improvement in his mitral insufficiency to mild.  In August 2020 he had an echocardiogram and Myoview for some symptoms of chest pain.  His Myoview was low risk and his echocardiogram showed an EF of 60% with mild MR and a normally functioning prosthetic aortic valve.  In May 2021 he was admitted with recurrent AF and CHF.  TTE showed mild MR, TEE showed severe MR.  He was cardioverted to NSR and f/u echo in NSR continued to show severe MR. Cath 09/13/2019 showed an 80% non dominant RCA, patent stent in OM1(40%), patent LPDA stent, an occluded LAD with a patent LIMA to LAD graft.  He had severe MR at cath with high filling [ressures and severe pulmonary HTN.   He ultimately underwent MR replacement with a tissue valve, TV repair, and MAZE 10/05/2019. Post op he developed CHB and a MDT pacemaker was implanted 10/13/2019.  He is  in the office today for follow up.  Since discharge he has gradually improved.  He has anorexia and fatigue but no unusual dyspnea or tachycardia.  EKG shows A sensing -V pacing.   Past Medical History:  Diagnosis Date  . 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 Arthritis    hands  . Atrial flutter with rapid ventricular response ([place]) s/p TEE/DCCV 07/28/19 successful 10/19/2014  . CHF (congestive heart failure) ([place])   . Coronary artery disease   . Depression   . Dyspnea   . Dysrhythmia   . 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 Enterococcal bacteremia 10/19/2014  . Heart murmur   . Hepatitis    C  . Hypertension   . Memory difficulty   . Migraine 09/12/2015  . Mini stroke ([place])    x22  . 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 Paroxysmal atrial fibrillation ([place]) 10/19/2014  . Paroxysmal atrial flutter ([place]) 10/19/2014  . Prostate cancer ([place]) dx'd 2015   surg only per pt  . Protein-calorie malnutrition, severe ([place])   . Restless leg syndrome   . 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/2014   23 mm [facility] Ease bovine pericardial bioprosthetic tissue valve with bovine pericardial patch enlargement of aortic root  . 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  S/P Maze operation for atrial fibrillation 10/05/2019   Complete bilateral atrial lesion set using cryothermy and bipolar radiofrequency ablation  . S/P mitral valve repair 11/01/2014   Debridement of vegetation on anterior leaflet  . S/P mitral valve replacement with bioprosthetic valve 10/05/2019   29 mm Medtronic Mosaic stented porcine bioprosthetic tissue valve  . S/P tricuspid valve repair 10/05/2019   28 mm [facility] mc3 ring annuloplasty  . 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 Severe mitral regurgitation   . 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 Tricuspid regurgitation      Past Surgical History:  Procedure Laterality Date  . AORTIC ROOT ENLARGEMENT N/A 11/01/2014   Procedure: AORTIC ROOT ENLARGEMENT;  Surgeon: Rexene Alberts, MD;  Location: [place];  Service: Open Heart Surgery;  Laterality: N/A;  . AORTIC VALVE REPLACEMENT N/A 11/01/2014   Procedure: AORTIC VALVE REPLACEMENT (AVR);  Surgeon: Rexene Alberts, MD;  Location: [place];  Service: Open Heart Surgery;  Laterality: N/A;  . CARDIAC CATHETERIZATION N/A 10/23/2014   Procedure: Right/Left Heart Cath and Coronary Angiography;  Surgeon: Lorretta Harp, MD;  Location: [place] CV LAB;  Service: Cardiovascular;  Laterality: N/A;  . CARDIOVERSION N/A 12/18/2014   Procedure: CARDIOVERSION;  Surgeon: Pixie Casino, MD;  Location: [facility] ENDOSCOPY;  Service: Cardiovascular;  Laterality: N/A;  . CARDIOVERSION N/A 02/06/2015   Procedure: CARDIOVERSION;  Surgeon: Larey Dresser, MD;  Location: [place];  Service: Cardiovascular;  Laterality: N/A;  . CARDIOVERSION N/A 04/07/2018   Procedure: CARDIOVERSION;  Surgeon: Sanda Klein, MD;  Location: [facility] ENDOSCOPY;  Service: Cardiovascular;  Laterality: N/A;  . CARDIOVERSION N/A 07/28/2019   Procedure: CARDIOVERSION;  Surgeon: Lelon Perla, MD;  Location: [facility] ENDOSCOPY;  Service: Cardiovascular;  Laterality: N/A;  . COLONOSCOPY N/A 10/25/2014   Procedure: COLONOSCOPY;  Surgeon: Arta Silence, MD;  Location: [facility] ENDOSCOPY;  Service: Endoscopy;  Laterality: N/A;  . CORONARY ARTERY BYPASS GRAFT N/A 11/01/2014   Procedure: CORONARY ARTERY BYPASS GRAFTING (CABG) x 1 using internal mammary artery;  Surgeon: Rexene Alberts, MD;  Location: [place];  Service: Open Heart Surgery;  Laterality: N/A;  . CORONARY CTO INTERVENTION N/A 04/12/2018   Procedure: CORONARY CTO INTERVENTION;  Surgeon: [name], MD;  Location: [place] CV LAB;  Service: Cardiovascular;  Laterality: N/A;  . CORONARY STENT INTERVENTION N/A 04/12/2018   Procedure: CORONARY STENT INTERVENTION;  Surgeon:  [name], MD;  Location: [place] CV LAB;  Service: Cardiovascular;  Laterality: N/A;  . EYE SURGERY Bilateral    cataracts  . MAZE N/A 10/05/2019   Procedure: MAZE;  Surgeon: Rexene Alberts, MD;  Location: [place];  Service: Open Heart Surgery;  Laterality: N/A;  . MITRAL VALVE REPAIR N/A 11/01/2014   Procedure: MITRAL VALVE REPAIR with no Ring;  Surgeon: Rexene Alberts, MD;  Location: [place];  Service: Open Heart Surgery;  Laterality: N/A;  . MITRAL VALVE REPLACEMENT N/A 10/05/2019   Procedure: MITRAL VALVE (MV) REPLACEMENT USING MOSAIC 29MM VALVE;  Surgeon: Rexene Alberts, MD;  Location: [place];  Service: Open Heart Surgery;  Laterality: N/A;  . PACEMAKER IMPLANT N/A 10/10/2019   Procedure: PACEMAKER IMPLANT;  Surgeon: Evans Lance, MD;  Location: [place] CV LAB;  Service: Cardiovascular;  Laterality: N/A;  . PROSTATECTOMY    . RIGHT/LEFT HEART CATH AND CORONARY ANGIOGRAPHY N/A 09/13/2019   Procedure: RIGHT/LEFT HEART CATH AND CORONARY ANGIOGRAPHY;  Surgeon: Martinique, Peter M, MD;  Location: [place] CV  LAB;  Service: Cardiovascular;  Laterality: N/A;  . RIGHT/LEFT HEART CATH AND CORONARY/GRAFT ANGIOGRAPHY N/A 04/12/2018   Procedure: RIGHT/LEFT HEART CATH AND CORONARY/GRAFT ANGIOGRAPHY;  Surgeon: [name], MD;  Location: [place] CV LAB;  Service: Cardiovascular;  Laterality: N/A;  . TEE WITHOUT CARDIOVERSION N/A 10/20/2014   Procedure: TRANSESOPHAGEAL ECHOCARDIOGRAM (TEE);  Surgeon: Josue Hector, MD;  Location: [place];  Service: Cardiovascular;  Laterality: N/A;  . TEE WITHOUT CARDIOVERSION N/A 11/01/2014   Procedure: TRANSESOPHAGEAL ECHOCARDIOGRAM (TEE);  Surgeon: Rexene Alberts, MD;  Location: [place];  Service: Open Heart Surgery;  Laterality: N/A;  . TEE WITHOUT CARDIOVERSION N/A 04/08/2018   Procedure: TRANSESOPHAGEAL ECHOCARDIOGRAM (TEE);  Surgeon: Jerline Pain, MD;  Location: [facility] ENDOSCOPY;  Service: Cardiovascular;  Laterality: N/A;  . TEE WITHOUT  CARDIOVERSION N/A 07/28/2019   Procedure: TRANSESOPHAGEAL ECHOCARDIOGRAM (TEE);  Surgeon: Lelon Perla, MD;  Location: [facility] ENDOSCOPY;  Service: Cardiovascular;  Laterality: N/A;  . TEE WITHOUT CARDIOVERSION N/A 10/05/2019   Procedure: TRANSESOPHAGEAL ECHOCARDIOGRAM (TEE);  Surgeon: Rexene Alberts, MD;  Location: [place];  Service: Open Heart Surgery;  Laterality: N/A;  . TONSILLECTOMY    . TRICUSPID VALVE REPLACEMENT N/A 10/05/2019   Procedure: TRICUSPID VALVE REPAIR USING MC3 28MM ANNULOPLASTY RING;  Surgeon: Rexene Alberts, MD;  Location: [place];  Service: Open Heart Surgery;  Laterality: N/A;    Current Medications: Current Meds  Medication Sig  . acetaminophen (TYLENOL) 325 MG tablet Take 2 tablets (650 mg total) by mouth every 4 (four) hours as needed for headache or mild pain.  Marland Kitchen albuterol (VENTOLIN HFA) 108 (90 Base) MCG/ACT inhaler Inhale 2 puffs into the lungs every 6 (six) hours as needed for wheezing or shortness of breath.   Marland Kitchen amoxicillin (AMOXIL) 500 MG capsule Take four capsules 30 - 60 minutes before dental appointment. (Patient taking differently: Take 2,000 mg by mouth See admin instructions. Take four capsules (2000 mg) by mouth 30 - 60 minutes before dental appointment.)  . aspirin EC 81 MG EC tablet Take 1 tablet (81 mg total) by mouth daily. Swallow whole.  Marland Kitchen atorvastatin (LIPITOR) 40 MG tablet Take 1 tablet (40 mg total) by mouth daily.  Marland Kitchen doxylamine, Sleep, (UNISOM) 25 MG tablet Take 12.5 mg by mouth at bedtime.  . furosemide (LASIX) 20 MG tablet Take 2 tablets (40 mg total) by mouth daily.  Marland Kitchen gabapentin (NEURONTIN) 300 MG capsule Take 300 mg by mouth See admin instructions. Take one capsule (300 mg) by mouth twice daily - with supper and at bedtime  . Multiple Vitamins-Minerals (PRESERVISION AREDS 2) CAPS Take 1 capsule by mouth every evening.   . Omega-3 Fatty Acids (OMEGA-3 PO) Take 1 capsule by mouth daily.  . potassium chloride SA (KLOR-CON) 20 MEQ tablet Take 1  tablet (20 mEq total) by mouth daily.  . Probiotic Product (PROBIOTIC PO) Take 1 capsule by mouth 2 (two) times daily.   . traMADol (ULTRAM) 50 MG tablet Take 1 tablet (50 mg total) by mouth every 6 (six) hours as needed for moderate pain.  . Turmeric 500 MG CAPS Take 500 mg by mouth every evening.  . warfarin (COUMADIN) 5 MG tablet Take 0.5 tablets (2.5 mg total) by mouth daily at 4 PM. And as directed by the coumadin clinic     Allergies:   Pollen extract   Social History   Socioeconomic History  . Marital status: Married    Spouse name: Not on file  . Number of children: 1  .  Years of education: Master's  . Highest education level: Not on file  Occupational History  . Occupation: [employer]    Employer: [employer]  Tobacco Use  . Smoking status: Former Research scientist (life sciences)  . Smokeless tobacco: Never Used  . Tobacco comment: Smoked a pipe age 89->28  Vaping Use  . Vaping Use: Never used  Substance and Sexual Activity  . Alcohol use: No    Alcohol/week: 0.0 standard drinks    Comment: Drank from age 19->38  . Drug use: No  . Sexual activity: Not on file  Other Topics Concern  . Not on file  Social History Narrative   Lives at home w/ his wife   Right-handed   Caffeine: 2 cups daily   Social Determinants of Health   Financial Resource Strain:   . Difficulty of Paying Living Expenses: Not on file  Food Insecurity:   . Worried About Charity fundraiser in the Last Year: Not on file  . Ran Out of Food in the Last Year: Not on file  Transportation Needs:   . Lack of Transportation (Medical): Not on file  . Lack of Transportation (Non-Medical): Not on file  Physical Activity:   . Days of Exercise per Week: Not on file  . Minutes of Exercise per Session: Not on file  Stress:   . Feeling of Stress : Not on file  Social Connections:   . Frequency of Communication with Friends and Family: Not on file  . Frequency of Social Gatherings with Friends and Family: Not on file  . Attends  Religious Services: Not on file  . Active Member of Clubs or Organizations: Not on file  . Attends Archivist Meetings: Not on file  . Marital Status: Not on file     Family History: The patient's family history includes Stroke in his father.  ROS:   Please see the history of present illness.    All other systems reviewed and are negative.  EKGs/Labs/Other Studies Reviewed:    The following studies were reviewed today: CathAug 12, 2021  EKG:  EKG is ordered today.  The ekg ordered today demonstrates A sensing, V pacing  Recent Labs: 07/25/2019: B Natriuretic Peptide 458.3 10/03/2019: ALT 22 10/06/2019: Magnesium 2.3 10/12/2019: BUN 12; Creatinine, Ser 0.94; Hemoglobin 9.9; Platelets 182; Potassium 4.2; Sodium 134  Recent Lipid Panel    Component Value Date/Time   CHOL 154 03/06/2016 0954   TRIG 71 03/06/2016 0954   HDL 45 03/06/2016 0954   CHOLHDL 3.4 03/06/2016 0954   VLDL 14 03/06/2016 0954   LDLCALC 95 03/06/2016 0954    Physical Exam:    VS:  BP 124/82   Pulse 89   Ht 6' (1.829 m)   Wt 149 lb (67.6 kg)   SpO2 97%   BMI 20.21 kg/m     Wt Readings from Last 3 Encounters:  10/27/19 149 lb (67.6 kg)  10/13/19 152 lb 9.6 oz (69.2 kg)  10/03/19 168 lb (76.2 kg)     GEN: Thin Caucasian male well developed in no acute distress HEENT: Normal NECK: No JVD; No carotid bruits LYMPHATICS: No lymphadenopathy CARDIAC: RRR, soft systolic murmur LSB, no rubs, gallops RESPIRATORY:  Clear to auscultation without rales, wheezing or rhonchi  ABDOMEN: Soft, non-tender, non-distended MUSCULOSKELETAL:  No edema; No deformity  SKIN: Warm and dry NEUROLOGIC:  Alert and oriented x 3 PSYCHIATRIC:  Normal affect   ASSESSMENT:    S/P redo mitral valve replacement with bioprosthetic valve + tricuspid  valve repair + maze procedure 29 mm Medtronic Mosaic stented porcine bioprosthetic tissue MV and TV repair 10/05/2019  S/P Maze operation for atrial fibrillation Complete  bilateral atrial lesion set using cryothermy and bipolar radiofrequency ablation 10/05/2019  Pacemaker MDT PTVDP 10/13/2019 secondary to post op CHB  S/P CABG x 1 LIMA to LAD 2016. Patent LIMA-LAD at cath July 2021  S/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 23 mm [facility] Ease bovine pericardial bioprosthetic tissue valve with bovine pericardial patch enlargement of aortic root 2016  Anticoagulated On Couamdin-s/p MV repair, history of PAF/flutter  CAD S/P percutaneous coronary angioplasty Patent OM1 and LPDA stents, 80% non dominant RCA July 2021  PLAN:    He can stop lasix and K+ tomorrow. Check BMP and CBC today.  Keep f/u with Dr Roxy Manns as scheduled, f/u Dr Sallyanne Kuster 6-8 weeks.    Medication Adjustments/Labs and Tests Ordered: Current medicines are reviewed at length with the patient today.  Concerns regarding medicines are outlined above.  Orders Placed This Encounter  Procedures  . Basic metabolic panel  . CBC  . EKG 12-Lead   No orders of the defined types were placed in this encounter.   Patient Instructions  Medication Instructions:  Your physician recommends that you continue on your current medications as directed. Please refer to the Current Medication list given to you today.  *If you need a refill on your cardiac medications before your next appointment, please call your pharmacy*  Lab Work: Your physician recommends that you return for lab work TODAY:   BMET  CBC If you have labs (blood work) drawn today and your tests are completely normal, you will receive your results only by: Marland Kitchen MyChart Message (if you have MyChart) OR . A paper copy in the mail If you have any lab test that is abnormal or we need to change your treatment, we will call you to review the results.  Testing/Procedures: NONE ordered at this time of appointment   Follow-Up: At [facility], you and your health needs are our priority.  As part of our continuing mission  to provide you with exceptional heart care, we have created designated Provider Care Teams.  These Care Teams include your primary Cardiologist (physician) and Advanced Practice Providers (APPs -  Physician Assistants and Nurse Practitioners) who all work together to provide you with the care you need, when you need it.  Your next appointment:   2-3 month(s)  The format for your next appointment:   In Person  Provider:   Sanda Klein, MD  Other Instructions      Signed, Kerin Ransom, PA-C  10/27/2019 11:58 AM    [place]

## 2019-10-27 NOTE — Assessment & Plan Note (Signed)
Complete bilateral atrial lesion set using cryothermy and bipolar radiofrequency ablation 10/05/2019

## 2019-10-27 NOTE — Assessment & Plan Note (Signed)
On Couamdin-s/p MV repair, history of PAF/flutter

## 2019-10-27 NOTE — Patient Instructions (Signed)
Medication Instructions:  Your physician recommends that you continue on your current medications as directed. Please refer to the Current Medication list given to you today.  *If you need a refill on your cardiac medications before your next appointment, please call your pharmacy*  Lab Work: Your physician recommends that you return for lab work TODAY:   BMET  CBC If you have labs (blood work) drawn today and your tests are completely normal, you will receive your results only by: Marland Kitchen MyChart Message (if you have MyChart) OR . A paper copy in the mail If you have any lab test that is abnormal or we need to change your treatment, we will call you to review the results.  Testing/Procedures: NONE ordered at this time of appointment   Follow-Up: At [facility], you and your health needs are our priority.  As part of our continuing mission to provide you with exceptional heart care, we have created designated Provider Care Teams.  These Care Teams include your primary Cardiologist (physician) and Advanced Practice Providers (APPs -  Physician Assistants and Nurse Practitioners) who all work together to provide you with the care you need, when you need it.  Your next appointment:   2-3 month(s)  The format for your next appointment:   In Person  Provider:   Sanda Klein, MD  Other Instructions

## 2019-10-27 NOTE — Assessment & Plan Note (Signed)
LIMA to LAD 2016. Patent LIMA-LAD at cath July 2021

## 2019-10-28 LAB — BASIC METABOLIC PANEL
BUN/Creatinine Ratio: 19 (ref 10–24)
BUN: 18 mg/dL (ref 8–27)
CO2: 27 mmol/L (ref 20–29)
Calcium: 9.8 mg/dL (ref 8.6–10.2)
Chloride: 102 mmol/L (ref 96–106)
Creatinine, Ser: 0.93 mg/dL (ref 0.76–1.27)
GFR calc Af Amer: 95 mL/min/{1.73_m2} (ref >59)
GFR calc non Af Amer: 82 mL/min/{1.73_m2} (ref >59)
Glucose: 87 mg/dL (ref 65–99)
Potassium: 4.7 mmol/L (ref 3.5–5.2)
Sodium: 140 mmol/L (ref 134–144)

## 2019-10-28 LAB — CBC
Hematocrit: 36.3 % — ABNORMAL LOW (ref 37.5–51.0)
Hemoglobin: 11.2 g/dL — ABNORMAL LOW (ref 13.0–17.7)
MCH: 25.6 pg — ABNORMAL LOW (ref 26.6–33.0)
MCHC: 30.9 g/dL — ABNORMAL LOW (ref 31.5–35.7)
MCV: 83 fL (ref 79–97)
Platelets: 271 10*3/uL (ref 150–450)
RBC: 4.37 x10E6/uL (ref 4.14–5.80)
RDW: 14.4 % (ref 11.6–15.4)
WBC: 6.5 10*3/uL (ref 3.4–10.8)

## 2019-11-04 ENCOUNTER — Other Ambulatory Visit: Payer: Self-pay | Admitting: Thoracic Surgery (Cardiothoracic Vascular Surgery)

## 2019-11-04 ENCOUNTER — Encounter: Payer: Self-pay | Admitting: *Deleted

## 2019-11-04 DIAGNOSIS — Z951 Presence of aortocoronary bypass graft: Secondary | ICD-10-CM

## 2019-11-07 ENCOUNTER — Encounter: Payer: Self-pay | Admitting: Thoracic Surgery (Cardiothoracic Vascular Surgery)

## 2019-11-07 ENCOUNTER — Other Ambulatory Visit: Payer: Self-pay

## 2019-11-07 ENCOUNTER — Ambulatory Visit (INDEPENDENT_AMBULATORY_CARE_PROVIDER_SITE_OTHER): Payer: Self-pay | Admitting: Thoracic Surgery (Cardiothoracic Vascular Surgery)

## 2019-11-07 ENCOUNTER — Ambulatory Visit
Admission: RE | Admit: 2019-11-07 | Discharge: 2019-11-07 | Disposition: A | Discharge status: Home / Self Care | Payer: Medicare Other | Source: Ambulatory Visit | Attending: Thoracic Surgery (Cardiothoracic Vascular Surgery) | Admitting: Thoracic Surgery (Cardiothoracic Vascular Surgery)

## 2019-11-07 VITALS — BP 119/79 | HR 90 | Temp 97.9°F | Resp 20 | Ht 72.0 in | Wt 148.0 lb

## 2019-11-07 DIAGNOSIS — I071 Rheumatic tricuspid insufficiency: Secondary | ICD-10-CM

## 2019-11-07 DIAGNOSIS — Z951 Presence of aortocoronary bypass graft: Secondary | ICD-10-CM

## 2019-11-07 DIAGNOSIS — I340 Nonrheumatic mitral (valve) insufficiency: Secondary | ICD-10-CM

## 2019-11-07 DIAGNOSIS — Z952 Presence of prosthetic heart valve: Secondary | ICD-10-CM

## 2019-11-07 DIAGNOSIS — Z953 Presence of xenogenic heart valve: Secondary | ICD-10-CM

## 2019-11-07 DIAGNOSIS — Z8679 Personal history of other diseases of the circulatory system: Secondary | ICD-10-CM

## 2019-11-07 DIAGNOSIS — I480 Paroxysmal atrial fibrillation: Secondary | ICD-10-CM

## 2019-11-07 DIAGNOSIS — Z9889 Other specified postprocedural states: Secondary | ICD-10-CM

## 2019-11-07 NOTE — Progress Notes (Signed)
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 using a stented bioprosthetic tissue valve, coronary artery disease status post coronary artery bypass grafting and more recently status post PCI and stenting of the posterior descending coronary artery, mitral regurgitation, chronic diastolic congestive heart failure, recurrent atrial fibrillation and atrial flutter status post DC cardioversion on multiple occasions, restless leg syndrome, and remote history of prostate cancer who returns to the office today for routine follow-up status post redo median sternotomy for mitral valve replacement using a bioprosthetic tissue valve, tricuspid valve repair, and Maze procedure on October 05, 2019.  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  He otherwise did well and was recently seen in follow-up by Kerin Ransom at [facility] on October 27, 2019 at which time he remained in sinus rhythm with appropriate sensing and V pacing.  He returns to our office today for routine follow-up.  He reports that he is doing very well.  He states that he feels much better at this point than he did following his first heart surgery in 2016.  He notes that his breathing is already dramatically better and that it was prior to hospital admission.  He has minimal soreness in his chest and he comments that he feels much better now that he no longer has sternal wires.  He has not had any  palpitations to suggest a recurrence of atrial fibrillation.  He remains on low-dose aspirin with warfarin anticoagulation.  Most recent INR was reported only 1.2 on October 24, 2019.  He is scheduled for follow-up INR later this week.   Current Outpatient Medications  Medication Sig Dispense Refill  . acetaminophen (TYLENOL) 325 MG tablet Take 2 tablets (650 mg total) by mouth every 4 (four) hours as needed for headache or mild pain.    Marland Kitchen albuterol (VENTOLIN HFA) 108 (90 Base) MCG/ACT inhaler Inhale 2 puffs into the lungs every 6 (six) hours as needed for wheezing or shortness of breath.     Marland Kitchen amoxicillin (AMOXIL) 500 MG capsule Take four capsules 30 - 60 minutes before dental appointment. (Patient taking differently: Take 2,000 mg by mouth See admin instructions. Take four capsules (2000 mg) by mouth 30 - 60 minutes before dental appointment.) 4 capsule 2  . aspirin EC 81 MG EC tablet Take 1 tablet (81 mg total) by mouth daily. Swallow whole.    Marland Kitchen atorvastatin (LIPITOR) 40 MG tablet Take 1 tablet (40 mg total) by mouth daily. 30 tablet 11  . doxylamine, Sleep, (UNISOM) 25 MG tablet Take 12.5 mg by mouth at bedtime.    . furosemide (LASIX) 20 MG tablet Take 2 tablets (40 mg total) by mouth daily. 180 tablet 3  . gabapentin (NEURONTIN) 300 MG capsule Take 300 mg by mouth See admin instructions. Take one capsule (300 mg) by mouth twice daily - with supper and at bedtime    . Multiple Vitamins-Minerals (PRESERVISION AREDS 2) CAPS  Take 1 capsule by mouth every evening.     . Omega-3 Fatty Acids (OMEGA-3 PO) Take 1 capsule by mouth daily.    . potassium chloride SA (KLOR-CON) 20 MEQ tablet Take 1 tablet (20 mEq total) by mouth daily. 90 tablet 3  . Probiotic Product (PROBIOTIC PO) Take 1 capsule by mouth 2 (two) times daily.     . traMADol (ULTRAM) 50 MG tablet Take 1 tablet (50 mg total) by mouth every 6 (six) hours as needed for moderate pain. 28 tablet 0  . Turmeric 500 MG CAPS Take 500 mg by mouth  every evening.    . warfarin (COUMADIN) 5 MG tablet Take 0.5 tablets (2.5 mg total) by mouth daily at 4 PM. And as directed by the coumadin clinic 100 tablet 1   No current facility-administered medications for this visit.      Physical Exam:   BP 119/79   Pulse 90   Temp 97.9 F (36.6 C) (Skin)   Resp 20   Ht 6' (1.829 m)   Wt 148 lb (67.1 kg)   SpO2 97% Comment: RA  BMI 20.07 kg/m   General:  Well-appearing  Chest:   Clear to auscultation  CV:   Regular rate and rhythm without murmur  Incisions:  Healing nicely, sternum is stable  Abdomen:  Soft nontender  Extremities:  Warm and well-perfused  Diagnostic Tests:  CHEST - 2 VIEW  COMPARISON:  October 11, 2019.  FINDINGS: In the interval since previous study, there has been removal of chest tubes bilaterally. No pneumothorax evident. There is slight right upper lobe atelectasis. Lungs elsewhere are clear. Heart size and pulmonary vascularity are normal. Pacemaker leads are attached to the right atrium and right ventricle. Patient is status post mitral, tricuspid, aortic valve replacements. There is a left atrial appendage clamp. No evident adenopathy. No bone lesions.  IMPRESSION: Postoperative changes. Heart size within normal limits. Slight atelectasis right upper lobe. Lungs otherwise clear. No evident pneumothorax.   Electronically Signed   By: Lowella Grip III M.D.   On: 11/07/2019 13:03    2 channel telemetry rhythm strip demonstrates what appears to be sinus rhythm with appropriate V pacing    Impression:  Patient is doing very well and maintaining sinus rhythm approximately 1 month status post redo median sternotomy for mitral valve replacement using a bioprosthetic tissue valve, tricuspid valve repair, and Maze procedure.  Plan:  I have instructed the patient that he may stop taking Lasix and potassium supplementation at this time.  We have otherwise not recommended any changes to his  current medications.  I have encouraged the patient to continue to gradually increase his physical activity with his primary limitation remaining that he refrain from heavy lifting or strenuous use of his arms or shoulders for at least another 2 months.  I encouraged him to participate in the cardiac rehab program.  The patient will continue to follow-up regularly with Dr. Sallyanne Kuster and should undergo routine follow-up echocardiogram within the next few weeks.  He will return to our office for routine follow-up in 2 months.    [name]. Roxy Manns, MD 11/07/2019 1:18 PM

## 2019-11-07 NOTE — Patient Instructions (Addendum)
You may stop taking Lasix and potassium.  Check your weight on a regular basis and keep a log for your records.  Look for signs of fluid overload such as worsening swelling of your lower legs, increased shortness of breath with activity, and/or a dry nonproductive cough.  Discussed these findings with your cardiologist including whether or not you should adjust your fluid pill dosage (diuretic).  Continue all other previous medications without any changes at this time  Continue to avoid any heavy lifting or strenuous use of your arms or shoulders for at least a total of three months from the time of surgery.  After three months you may gradually increase how much you lift or otherwise use your arms or chest as tolerated, with limits based upon whether or not activities lead to the return of significant discomfort.  You are encouraged to enroll and participate in the outpatient cardiac rehab program beginning as soon as practical.

## 2019-11-08 NOTE — Progress Notes (Signed)
Remarkable! Thank you so much [facility]

## 2019-11-09 ENCOUNTER — Other Ambulatory Visit: Payer: Self-pay

## 2019-11-09 ENCOUNTER — Ambulatory Visit (INDEPENDENT_AMBULATORY_CARE_PROVIDER_SITE_OTHER): Payer: Medicare Other

## 2019-11-09 DIAGNOSIS — I480 Paroxysmal atrial fibrillation: Secondary | ICD-10-CM | POA: Diagnosis not present

## 2019-11-09 DIAGNOSIS — Z953 Presence of xenogenic heart valve: Secondary | ICD-10-CM | POA: Diagnosis not present

## 2019-11-09 DIAGNOSIS — Z5181 Encounter for therapeutic drug level monitoring: Secondary | ICD-10-CM | POA: Diagnosis not present

## 2019-11-09 LAB — POCT INR: INR: 1.2 — AB (ref 2.0–3.0)

## 2019-11-09 NOTE — Patient Instructions (Signed)
Today take 2 tablets then increase Warfarin to 1/2 tablet daily except 1 tablet on Monday, Wednesday and Friday. Recheck INR in 2 week. [facility] [phone] Main [phone]

## 2019-11-10 ENCOUNTER — Other Ambulatory Visit: Payer: Self-pay

## 2019-11-10 DIAGNOSIS — I361 Nonrheumatic tricuspid (valve) insufficiency: Secondary | ICD-10-CM

## 2019-11-10 DIAGNOSIS — Z952 Presence of prosthetic heart valve: Secondary | ICD-10-CM

## 2019-11-10 DIAGNOSIS — I340 Nonrheumatic mitral (valve) insufficiency: Secondary | ICD-10-CM

## 2019-11-10 DIAGNOSIS — Z9889 Other specified postprocedural states: Secondary | ICD-10-CM

## 2019-11-11 ENCOUNTER — Telehealth (HOSPITAL_COMMUNITY): Payer: Self-pay | Admitting: *Deleted

## 2019-11-11 NOTE — Telephone Encounter (Signed)
Received second referral for cardiac rehab from Dr. Roxy Manns for this pt to participate in cardiac rehab. Pt had previously declined to participate on 8/21.  Called and left message for pt to verify if he had changed his mind and now would like to participate.  Contact information provided. Cherre Huger, BSN Cardiac and Training and development officer

## 2019-11-21 ENCOUNTER — Other Ambulatory Visit: Payer: Self-pay

## 2019-11-21 ENCOUNTER — Other Ambulatory Visit (HOSPITAL_COMMUNITY): Payer: Medicare Other

## 2019-11-21 ENCOUNTER — Ambulatory Visit (HOSPITAL_COMMUNITY): Payer: Medicare Other | Attending: Internal Medicine

## 2019-11-21 DIAGNOSIS — Z952 Presence of prosthetic heart valve: Secondary | ICD-10-CM | POA: Diagnosis present

## 2019-11-21 DIAGNOSIS — I340 Nonrheumatic mitral (valve) insufficiency: Secondary | ICD-10-CM | POA: Diagnosis present

## 2019-11-21 LAB — ECHOCARDIOGRAM COMPLETE
AR max vel: 1.77 cm2
AV Area VTI: 1.72 cm2
AV Area mean vel: 1.69 cm2
AV Mean grad: 6 mmHg
AV Peak grad: 6.9 mmHg
Ao pk vel: 1.31 m/s
Area-P 1/2: 4.44 cm2
S' Lateral: 2.8 cm

## 2019-11-22 ENCOUNTER — Telehealth (HOSPITAL_COMMUNITY): Payer: Self-pay

## 2019-11-22 NOTE — Telephone Encounter (Signed)
Called and spoke with pt in regards to CR, pt stated he lives 45 mins away and he is walking at home.   Closed referral

## 2019-11-23 ENCOUNTER — Ambulatory Visit (INDEPENDENT_AMBULATORY_CARE_PROVIDER_SITE_OTHER): Payer: Medicare Other

## 2019-11-23 ENCOUNTER — Other Ambulatory Visit: Payer: Self-pay

## 2019-11-23 DIAGNOSIS — Z953 Presence of xenogenic heart valve: Secondary | ICD-10-CM | POA: Diagnosis not present

## 2019-11-23 DIAGNOSIS — Z5181 Encounter for therapeutic drug level monitoring: Secondary | ICD-10-CM | POA: Diagnosis not present

## 2019-11-23 DIAGNOSIS — I480 Paroxysmal atrial fibrillation: Secondary | ICD-10-CM

## 2019-11-23 LAB — POCT INR: INR: 1.5 — AB (ref 2.0–3.0)

## 2019-11-23 NOTE — Patient Instructions (Signed)
Today take 2 tablets then increase Warfarin to 1 tablet daily except 1/2 tablet on Sunday, Tuesday and Thursday. Recheck INR in 2 week. [facility] [phone] Main [phone]

## 2019-12-07 ENCOUNTER — Ambulatory Visit (INDEPENDENT_AMBULATORY_CARE_PROVIDER_SITE_OTHER): Payer: Medicare Other

## 2019-12-07 ENCOUNTER — Other Ambulatory Visit: Payer: Self-pay

## 2019-12-07 DIAGNOSIS — I480 Paroxysmal atrial fibrillation: Secondary | ICD-10-CM | POA: Diagnosis not present

## 2019-12-07 DIAGNOSIS — Z5181 Encounter for therapeutic drug level monitoring: Secondary | ICD-10-CM

## 2019-12-07 DIAGNOSIS — Z953 Presence of xenogenic heart valve: Secondary | ICD-10-CM | POA: Diagnosis not present

## 2019-12-07 LAB — POCT INR: INR: 1.6 — AB (ref 2.0–3.0)

## 2019-12-07 NOTE — Patient Instructions (Signed)
Today take 2 tablets then increase Warfarin to 1 tablet daily except 1/2 tablet on Tuesday and Thursday. Recheck INR in 2 week. [facility] [phone] Main [phone]

## 2019-12-12 ENCOUNTER — Other Ambulatory Visit: Payer: Self-pay | Admitting: Cardiovascular Disease

## 2019-12-12 NOTE — Telephone Encounter (Signed)
   *  STAT* If patient is at the pharmacy, call can be transferred to refill team.   1. Which medications need to be refilled? (please list name of each medication and dose if known)   warfarin (COUMADIN) 5 MG tablet    2. Which pharmacy/location (including street and city if local pharmacy) is medication to be sent to?[place], [place] AT [address]  3. Do they need a 30 day or 90 day supply? 30 days  Pt will ran out of mediation in 3 days and needs refill until his appt on 12/21/19

## 2019-12-13 MED ORDER — WARFARIN SODIUM 5 MG PO TABS
2.5000 mg | ORAL_TABLET | Freq: Every day | ORAL | 1 refills | Status: DC
Start: 2019-12-13 — End: 2019-12-21

## 2019-12-21 ENCOUNTER — Ambulatory Visit (INDEPENDENT_AMBULATORY_CARE_PROVIDER_SITE_OTHER): Payer: Medicare Other | Admitting: Pharmacist Clinician (PhC)/ Clinical Pharmacy Specialist

## 2019-12-21 ENCOUNTER — Other Ambulatory Visit: Payer: Self-pay

## 2019-12-21 DIAGNOSIS — I480 Paroxysmal atrial fibrillation: Secondary | ICD-10-CM

## 2019-12-21 DIAGNOSIS — Z5181 Encounter for therapeutic drug level monitoring: Secondary | ICD-10-CM

## 2019-12-21 DIAGNOSIS — Z953 Presence of xenogenic heart valve: Secondary | ICD-10-CM

## 2019-12-21 LAB — POCT INR: INR: 1.5 — AB (ref 2.0–3.0)

## 2019-12-21 MED ORDER — WARFARIN SODIUM 5 MG PO TABS: 5.0000 mg | ORAL_TABLET | Freq: Every day | ORAL | 3 refills | Status: DC

## 2019-12-30 ENCOUNTER — Ambulatory Visit (INDEPENDENT_AMBULATORY_CARE_PROVIDER_SITE_OTHER): Payer: Medicare Other

## 2019-12-30 ENCOUNTER — Other Ambulatory Visit: Payer: Self-pay

## 2019-12-30 DIAGNOSIS — I480 Paroxysmal atrial fibrillation: Secondary | ICD-10-CM

## 2019-12-30 DIAGNOSIS — Z953 Presence of xenogenic heart valve: Secondary | ICD-10-CM | POA: Diagnosis not present

## 2019-12-30 DIAGNOSIS — Z5181 Encounter for therapeutic drug level monitoring: Secondary | ICD-10-CM | POA: Diagnosis not present

## 2019-12-30 LAB — POCT INR: INR: 2.2 (ref 2.0–3.0)

## 2019-12-30 NOTE — Patient Instructions (Signed)
Continue taking Warfarin to 1 tablet daily . Recheck INR in 4 week. [facility] [phone] Main [phone]

## 2020-01-09 ENCOUNTER — Other Ambulatory Visit: Payer: Self-pay

## 2020-01-09 ENCOUNTER — Encounter: Payer: Self-pay | Admitting: Cardiovascular Disease

## 2020-01-09 ENCOUNTER — Ambulatory Visit (INDEPENDENT_AMBULATORY_CARE_PROVIDER_SITE_OTHER): Payer: Medicare Other | Admitting: Cardiovascular Disease

## 2020-01-09 ENCOUNTER — Ambulatory Visit (INDEPENDENT_AMBULATORY_CARE_PROVIDER_SITE_OTHER): Payer: Medicare Other | Admitting: Thoracic Surgery (Cardiothoracic Vascular Surgery)

## 2020-01-09 ENCOUNTER — Encounter: Payer: Self-pay | Admitting: Thoracic Surgery (Cardiothoracic Vascular Surgery)

## 2020-01-09 VITALS — BP 97/63 | HR 93 | Ht 72.0 in | Wt 167.6 lb

## 2020-01-09 VITALS — BP 142/68 | HR 91 | Temp 97.7°F | Resp 18 | Wt 167.3 lb

## 2020-01-09 DIAGNOSIS — Z953 Presence of xenogenic heart valve: Secondary | ICD-10-CM

## 2020-01-09 DIAGNOSIS — F418 Other specified anxiety disorders: Secondary | ICD-10-CM

## 2020-01-09 DIAGNOSIS — E7800 Pure hypercholesterolemia, unspecified: Secondary | ICD-10-CM

## 2020-01-09 DIAGNOSIS — Z9861 Coronary angioplasty status: Secondary | ICD-10-CM | POA: Diagnosis not present

## 2020-01-09 DIAGNOSIS — Z952 Presence of prosthetic heart valve: Secondary | ICD-10-CM

## 2020-01-09 DIAGNOSIS — Z8679 Personal history of other diseases of the circulatory system: Secondary | ICD-10-CM

## 2020-01-09 DIAGNOSIS — I2510 Atherosclerotic heart disease of native coronary artery without angina pectoris: Secondary | ICD-10-CM

## 2020-01-09 DIAGNOSIS — Z9889 Other specified postprocedural states: Secondary | ICD-10-CM | POA: Diagnosis not present

## 2020-01-09 DIAGNOSIS — I480 Paroxysmal atrial fibrillation: Secondary | ICD-10-CM | POA: Diagnosis not present

## 2020-01-09 DIAGNOSIS — I442 Atrioventricular block, complete: Secondary | ICD-10-CM

## 2020-01-09 DIAGNOSIS — Z950 Presence of cardiac pacemaker: Secondary | ICD-10-CM

## 2020-01-09 NOTE — Patient Instructions (Signed)

## 2020-01-09 NOTE — Patient Instructions (Signed)
Continue all previous medications without any changes at this time  Discuss with your primary cardiologist whether or not to remain on Coumadin (warfarin) as a blood thinner versus resuming Xarelto  You may resume unrestricted physical activity without any particular limitations at this time.

## 2020-01-09 NOTE — Progress Notes (Signed)
Patient ID: Alan [name], male   DOB: 1950/07/24, 69 y.o.   MRN: [id number] Patient ID: Alan [name], male   DOB: [birth year], 69 y.o.   MRN: [id number]    Cardiology Office Note    Date:  01/09/2020   ID:  Alan [name], DOB 1950/05/05, MRN [id number]  PCP:  Lyman Bishop, DO  Cardiologist:   Sanda Klein, MD   Chief Complaint  Patient presents with  . Cardiac Valve Problem  . Pacemaker Check    History of Present Illness:  Alan [name] is a 69 y.o. 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 He underwent aortic valve replacement with a biological prosthesis and mitral valve repair as well as a single-vessel LIMA to LAD bypass for incidentally discovered CAD and atricure clipping of the left atrial appendage.  He later had recurrent problems with atrial fibrillation and severe mitral insufficiency.  In August 2021 he had to undergo repeat sternotomy for severe mitral regurgitation and received a bioprosthetic mitral valve replacement and also underwent tricuspid valve annuloplasty repair and surgical maze.  Here is doing remarkably well.  NYHA functional class I.  He started doing some fairly intense physical activities such as pulling out some cement posts from his garden, where he had an old dog pen.  He has some mild musculoskeletal discomfort in his chest at the end of the day after performing such activity but does not have exertional angina or dyspnea.  He also denies palpitations, dizziness, syncope, lower extremity edema, intermittent claudication or focal neurological complaints.  His postoperative echocardiogram performed on 11/21/2019 showed excellent findings.  LVEF was 55-60%.  The gradients were normal across the mitral and aortic valve prostheses and across the tricuspid valve annuloplasty repair.   He has moderate biatrial dilation.  The estimated systolic pulmonary artery pressure was dramatically improved at only 28 mmHg.  He remains pacemaker dependent.  There is no AV conduction today when the device is programmed VVI 30 bpm.  Pacemaker function is normal.  Outputs were reduced to chronic levels today and estimated generator longevity is over 12 years.  He has only 4% atrial pacing but has 100% ventricular pacing.  The pacing threshold is 0.75 V at 0.4 ms pulse width in both chambers.  Auto capture is on.  No atrial fibrillation has been recorded since device implantation.  He has a handful of episodes of atrial mode switch, without available electrograms since they last for only a couple of seconds.  A single 6 beat run of nonsustained ventricular tachycardia has been recorded.  On November 01, 2014, he underwent AVR with a bioprosthesis (23 mm MagnaEase),patch enlargement of the aortic root (Nicks' technique) and MV repair without annuloplasty ring, as well as single-vessel LIMA to LAD bypass for incidentally discovered CAD and clipping of the left atrial appendage (40 mm Atricure clip). Postop atrial flutter led to treatment with amiodarone and cardioversion on October 24 and a second cardioversion on January 11, 2015.  As the arrhythmia did not recur  anticoagulants were then discontinued, but were restarted when he presented with atrial flutter with 2: 1 AV block in January 2020.  April 12, 2018 received 2 stents to the proximal LAD upstream of the LIMA bypass and the left circumflex coronary artery (resolute onyx 2.030).  In January 2020 he presented with atrial flutter with 2: 1 AV block and  exertional dyspnea, but without angina.  He underwent cardioversion on April 07, 2018 and had profound and protracted hypotension that did not respond to IV fluids and pressors.  TEE showed severe  mitral insufficiency.  Cardiac catheterization April 12 2018 was followed by placement of stents  in the native LAD artery upstream of the LIMA bypass and the left circumflex coronary artery (both stents were resolute onyx 2.0 x 30).  He improved rapidly thereafter and by physical exam his mitral insufficiency appeared markedly improved.  Follow-up echocardiogram showed only mild mitral insufficiency and normal left ventricular systolic function.  In August 2020 he underwent a stress myocardial perfusion study that did not show any perfusion defects (although he did have "false positive" ST segment depression during Lexiscan infusion).  His echocardiogram performed around the same time showed normal left ventricular ejection fraction at 60-65%, moderately dilated left atrium, normal function of the aortic valve bioprosthesis, mild mitral insufficiency.  He had worsening exertional dyspnea and another echocardiogram performed in May 2021 showed pulmonary hypertension with estimated systolic PA P of 58 mmHg.  On transthoracic echo his mitral regurgitation was estimated to be mild to moderate, but T on July 28, 2019 showed severe mitral regurgitation.  There was also moderate to severe tricuspid regurgitation.  LV function remained normal.  Right and left heart catheterization on July 2021 showed occlusion of the proximal-mid LAD (with patent LIMA to the LAD), patent stents in the first OM and left PDA, 80% stenosis in nondominant right coronary artery.  On October 05, 2019 he underwent repeat sternotomy with mitral valve replacement (Medtronic Mosaic bioprosthesis, 29 mm), tricuspid valve repair Oletta Lamas MC 3 ring annuloplasty, 28 mm), and maze with cryotherapy and bipolar radiofrequency ablation (Dr. Roxy Manns).  He had postoperative complete heart block and on 10/13/2019 received a Medtronic Azure dual-chamber pacemaker (Dr. Lovena Le).  Past Medical History:  Diagnosis Date  . 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 Arthritis    hands  .  Atrial flutter with rapid ventricular response ([place]) s/p TEE/DCCV 07/28/19 successful 10/19/2014  . CHF (congestive heart failure) ([place])   . Coronary artery disease   . Depression   . Dyspnea   . Dysrhythmia   . 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 Enterococcal bacteremia 10/19/2014  . Heart murmur   . Hepatitis    C  . Hypertension   . Memory difficulty   . Migraine 09/12/2015  . Mini stroke ([place])    x22  . 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 Paroxysmal atrial fibrillation ([place]) 10/19/2014  . Paroxysmal atrial flutter ([place]) 10/19/2014  . Prostate cancer ([place]) dx'd 2015   surg only per pt  . Protein-calorie malnutrition, severe ([place])   . Restless leg syndrome   . 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/2014   23 mm [facility] Ease bovine pericardial bioprosthetic tissue valve with bovine pericardial patch enlargement of aortic root  . 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 S/P Maze operation for atrial fibrillation 10/05/2019   Complete bilateral atrial lesion set using cryothermy and bipolar radiofrequency ablation  . S/P mitral valve repair 11/01/2014   Debridement of vegetation on anterior leaflet  . S/P mitral valve replacement with bioprosthetic valve 10/05/2019   29 mm Medtronic Mosaic stented porcine bioprosthetic tissue valve  . S/P tricuspid valve repair 10/05/2019   28 mm Edwards mc3 ring annuloplasty  . Septic  embolism ([place]) 10/20/2014   brain and spleen, subclinical  . Severe mitral regurgitation   . 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 Tricuspid regurgitation     Past Surgical History:  Procedure Laterality Date  . AORTIC ROOT ENLARGEMENT N/A 11/01/2014   Procedure: AORTIC ROOT ENLARGEMENT;  Surgeon: Rexene Alberts, MD;  Location: [place];  Service: Open Heart Surgery;  Laterality: N/A;  . AORTIC VALVE REPLACEMENT N/A 11/01/2014   Procedure: AORTIC VALVE REPLACEMENT (AVR);  Surgeon: Rexene Alberts, MD;  Location: [place];   Service: Open Heart Surgery;  Laterality: N/A;  . CARDIAC CATHETERIZATION N/A 10/23/2014   Procedure: Right/Left Heart Cath and Coronary Angiography;  Surgeon: Lorretta Harp, MD;  Location: [place] CV LAB;  Service: Cardiovascular;  Laterality: N/A;  . CARDIOVERSION N/A 12/18/2014   Procedure: CARDIOVERSION;  Surgeon: Pixie Casino, MD;  Location: [facility] ENDOSCOPY;  Service: Cardiovascular;  Laterality: N/A;  . CARDIOVERSION N/A 02/06/2015   Procedure: CARDIOVERSION;  Surgeon: Larey Dresser, MD;  Location: [place];  Service: Cardiovascular;  Laterality: N/A;  . CARDIOVERSION N/A 04/07/2018   Procedure: CARDIOVERSION;  Surgeon: Sanda Klein, MD;  Location: [facility] ENDOSCOPY;  Service: Cardiovascular;  Laterality: N/A;  . CARDIOVERSION N/A 07/28/2019   Procedure: CARDIOVERSION;  Surgeon: Lelon Perla, MD;  Location: [facility] ENDOSCOPY;  Service: Cardiovascular;  Laterality: N/A;  . COLONOSCOPY N/A 10/25/2014   Procedure: COLONOSCOPY;  Surgeon: Arta Silence, MD;  Location: [facility] ENDOSCOPY;  Service: Endoscopy;  Laterality: N/A;  . CORONARY ARTERY BYPASS GRAFT N/A 11/01/2014   Procedure: CORONARY ARTERY BYPASS GRAFTING (CABG) x 1 using internal mammary artery;  Surgeon: Rexene Alberts, MD;  Location: [place];  Service: Open Heart Surgery;  Laterality: N/A;  . CORONARY CTO INTERVENTION N/A 04/12/2018   Procedure: CORONARY CTO INTERVENTION;  Surgeon: [name], MD;  Location: [place] CV LAB;  Service: Cardiovascular;  Laterality: N/A;  . CORONARY STENT INTERVENTION N/A 04/12/2018   Procedure: CORONARY STENT INTERVENTION;  Surgeon: [name], MD;  Location: [place] CV LAB;  Service: Cardiovascular;  Laterality: N/A;  . EYE SURGERY Bilateral    cataracts  . MAZE N/A 10/05/2019   Procedure: MAZE;  Surgeon: Rexene Alberts, MD;  Location: [place];  Service: Open Heart Surgery;  Laterality: N/A;  . MITRAL VALVE REPAIR N/A 11/01/2014   Procedure: MITRAL VALVE REPAIR with no Ring;  Surgeon:  Rexene Alberts, MD;  Location: [place];  Service: Open Heart Surgery;  Laterality: N/A;  . MITRAL VALVE REPLACEMENT N/A 10/05/2019   Procedure: MITRAL VALVE (MV) REPLACEMENT USING MOSAIC 29MM VALVE;  Surgeon: Rexene Alberts, MD;  Location: [place];  Service: Open Heart Surgery;  Laterality: N/A;  . PACEMAKER IMPLANT N/A 10/10/2019   Procedure: PACEMAKER IMPLANT;  Surgeon: Evans Lance, MD;  Location: [place] CV LAB;  Service: Cardiovascular;  Laterality: N/A;  . PROSTATECTOMY    . RIGHT/LEFT HEART CATH AND CORONARY ANGIOGRAPHY N/A 09/13/2019   Procedure: RIGHT/LEFT HEART CATH AND CORONARY ANGIOGRAPHY;  Surgeon: Martinique, Peter M, MD;  Location: [place] CV LAB;  Service: Cardiovascular;  Laterality: N/A;  . RIGHT/LEFT HEART CATH AND CORONARY/GRAFT ANGIOGRAPHY N/A 04/12/2018   Procedure: RIGHT/LEFT HEART CATH AND CORONARY/GRAFT ANGIOGRAPHY;  Surgeon: [name], MD;  Location: [place] CV LAB;  Service: Cardiovascular;  Laterality: N/A;  . TEE WITHOUT CARDIOVERSION N/A 10/20/2014   Procedure: TRANSESOPHAGEAL ECHOCARDIOGRAM (TEE);  Surgeon: Josue Hector, MD;  Location: [place];  Service: Cardiovascular;  Laterality: N/A;  .  TEE WITHOUT CARDIOVERSION N/A 11/01/2014   Procedure: TRANSESOPHAGEAL ECHOCARDIOGRAM (TEE);  Surgeon: Rexene Alberts, MD;  Location: [place];  Service: Open Heart Surgery;  Laterality: N/A;  . TEE WITHOUT CARDIOVERSION N/A 04/08/2018   Procedure: TRANSESOPHAGEAL ECHOCARDIOGRAM (TEE);  Surgeon: Jerline Pain, MD;  Location: [facility] ENDOSCOPY;  Service: Cardiovascular;  Laterality: N/A;  . TEE WITHOUT CARDIOVERSION N/A 07/28/2019   Procedure: TRANSESOPHAGEAL ECHOCARDIOGRAM (TEE);  Surgeon: Lelon Perla, MD;  Location: [facility] ENDOSCOPY;  Service: Cardiovascular;  Laterality: N/A;  . TEE WITHOUT CARDIOVERSION N/A 10/05/2019   Procedure: TRANSESOPHAGEAL ECHOCARDIOGRAM (TEE);  Surgeon: Rexene Alberts, MD;  Location: [place];  Service: Open Heart Surgery;  Laterality: N/A;  .  TONSILLECTOMY    . TRICUSPID VALVE REPLACEMENT N/A 10/05/2019   Procedure: TRICUSPID VALVE REPAIR USING MC3 28MM ANNULOPLASTY RING;  Surgeon: Rexene Alberts, MD;  Location: [place];  Service: Open Heart Surgery;  Laterality: N/A;    Current Outpatient Medications  Medication Sig Dispense Refill  . acetaminophen (TYLENOL) 325 MG tablet Take 2 tablets (650 mg total) by mouth every 4 (four) hours as needed for headache or mild pain.    Marland Kitchen albuterol (VENTOLIN HFA) 108 (90 Base) MCG/ACT inhaler Inhale 2 puffs into the lungs every 6 (six) hours as needed for wheezing or shortness of breath.     Marland Kitchen amoxicillin (AMOXIL) 500 MG capsule Take four capsules 30 - 60 minutes before dental appointment. (Patient taking differently: Take 2,000 mg by mouth See admin instructions. Take four capsules (2000 mg) by mouth 30 - 60 minutes before dental appointment.) 4 capsule 2  . aspirin EC 81 MG EC tablet Take 1 tablet (81 mg total) by mouth daily. Swallow whole.    Marland Kitchen atorvastatin (LIPITOR) 40 MG tablet Take 1 tablet (40 mg total) by mouth daily. 30 tablet 11  . doxylamine, Sleep, (UNISOM) 25 MG tablet Take 12.5 mg by mouth at bedtime.    . gabapentin (NEURONTIN) 300 MG capsule Take 300 mg by mouth See admin instructions. Take one capsule (300 mg) by mouth twice daily - with supper and at bedtime    . Multiple Vitamins-Minerals (PRESERVISION AREDS 2) CAPS Take 1 capsule by mouth every evening.     . Omega-3 Fatty Acids (OMEGA-3 PO) Take 1 capsule by mouth daily.    . Probiotic Product (PROBIOTIC PO) Take 1 capsule by mouth 2 (two) times daily.     . Turmeric 500 MG CAPS Take 500 mg by mouth every evening.    . warfarin (COUMADIN) 5 MG tablet Take 1 tablet (5 mg total) by mouth daily. 135 tablet 3   No current facility-administered medications for this visit.    Allergies:   Pollen extract   Social History   Socioeconomic History  . Marital status: Married    Spouse name: Not on file  . Number of children: 1  .  Years of education: Master's  . Highest education level: Not on file  Occupational History  . Occupation: [employer]    Employer: [employer]  Tobacco Use  . Smoking status: Former Research scientist (life sciences)  . Smokeless tobacco: Never Used  . Tobacco comment: Smoked a pipe age 55->28  Vaping Use  . Vaping Use: Never used  Substance and Sexual Activity  . Alcohol use: No    Alcohol/week: 0.0 standard drinks    Comment: Drank from age 68->38  . Drug use: No  . Sexual activity: Not on file  Other Topics Concern  . Not on file  Social History Narrative   Lives at home w/ his wife   Right-handed   Caffeine: 2 cups daily   Social Determinants of Health   Financial Resource Strain:   . Difficulty of Paying Living Expenses: Not on file  Food Insecurity:   . Worried About Charity fundraiser in the Last Year: Not on file  . Ran Out of Food in the Last Year: Not on file  Transportation Needs:   . Lack of Transportation (Medical): Not on file  . Lack of Transportation (Non-Medical): Not on file  Physical Activity:   . Days of Exercise per Week: Not on file  . Minutes of Exercise per Session: Not on file  Stress:   . Feeling of Stress : Not on file  Social Connections:   . Frequency of Communication with Friends and Family: Not on file  . Frequency of Social Gatherings with Friends and Family: Not on file  . Attends Religious Services: Not on file  . Active Member of Clubs or Organizations: Not on file  . Attends Archivist Meetings: Not on file  . Marital Status: Not on file     Family History:  The patient's family history includes Stroke in his father.   ROS:   Please see the history of present illness.    ROS All other systems reviewed and are negative.   PHYSICAL EXAM:   VS:  BP 97/63   Pulse 93   Ht 6' (1.829 m)   Wt 167 lb 9.6 oz (76 kg)   SpO2 95%   BMI 22.73 kg/m      General: Alert, oriented x3, no distress, appears well Head: no evidence of trauma, PERRL, EOMI, no  exophtalmos or lid lag, no myxedema, no xanthelasma; normal ears, nose and oropharynx Neck: normal jugular venous pulsations and no hepatojugular reflux; brisk carotid pulses without delay and no carotid bruits Chest: clear to auscultation, no signs of consolidation by percussion or palpation, normal fremitus, symmetrical and full respiratory excursions Cardiovascular: normal position and quality of the apical impulse, regular rhythm, normal first and second heart sounds, no diastolic murmurs, rubs or gallops.  There is a 2/6 early peaking systolic ejection murmur in the aortic focus and a 1/6 holosystolic murmur at the lower left sternal border. Abdomen: no tenderness or distention, no masses by palpation, no abnormal pulsatility or arterial bruits, normal bowel sounds, no hepatosplenomegaly Extremities: no clubbing, cyanosis or edema; 2+ radial, ulnar and brachial pulses bilaterally; 2+ right femoral, posterior tibial and dorsalis pedis pulses; 2+ left femoral, posterior tibial and dorsalis pedis pulses; no subclavian or femoral bruits Neurological: grossly nonfocal Psych: Normal mood and affect    Wt Readings from Last 3 Encounters:  01/09/20 167 lb 4.8 oz (75.9 kg)  01/09/20 167 lb 9.6 oz (76 kg)  11/07/19 148 lb (67.1 kg)    Studies/Labs Reviewed:   ECHO 11/21/2019: 1. 1 months post MV replacement and TV repair. Both prosthesis are  functioning properly with only trivial mitral and tricuspid regurgitation.  2. Left ventricular ejection fraction, by estimation, is 55 to 60%. The  left ventricle has normal function. The left ventricle has no regional  wall motion abnormalities. There is severe concentric left ventricular  hypertrophy. Left ventricular diastolic  function could not be evaluated.  3. Right ventricular systolic function is mildly reduced. The right  ventricular size is moderately enlarged. There is normal pulmonary artery  systolic pressure. The estimated right  ventricular systolic pressure is  27.6 mmHg.  4. Left atrial size was moderately dilated.  5. Right atrial size was moderately dilated.  6. The mitral valve has been repaired/replaced. Trivial mitral valve  regurgitation. No evidence of mitral stenosis. There is a 29 mm Medtronic  Mosaic stented porcine model #310 present in the mitral position.  Procedure Date: 10/05/19. Echo findings are  consistent with normal structure and function of the mitral valve  prosthesis.  7. Edwards mc3 ring annuloplasty (size 11mm). The tricuspid valve is has  been repaired/replaced.  8. The aortic valve has been repaired/replaced. Aortic valve  regurgitation is not visualized. No aortic stenosis is present. There is a  bioprosthetic valve present in the aortic position. Echo findings are  consistent with normal structure and function  of the aortic valve prosthesis. Aortic valve mean gradient measures 6.0  mmHg.  9. The inferior vena cava is normal in size with greater than 50%  respiratory variability, suggesting right atrial pressure of 3 mmHg.   KG:  EKG is not ordered today.  The intracardiac electrogram shows atrial sensed, ventricular paced rhythm. Recent Labs: 07/25/2019: B Natriuretic Peptide 458.3 10/03/2019: ALT 22 10/06/2019: Magnesium 2.3 10/27/2019: BUN 18; Creatinine, Ser 0.93; Hemoglobin 11.2; Platelets 271; Potassium 4.7; Sodium 140   Lipid Panel    Component Value Date/Time   CHOL 154 03/06/2016 0954   TRIG 71 03/06/2016 0954   HDL 45 03/06/2016 0954   CHOLHDL 3.4 03/06/2016 0954   VLDL 14 03/06/2016 0954   LDLCALC 95 03/06/2016 0954   01/26/2019 Total cholesterol 119, HDL 45, triglycerides 83, LDL 58  ASSESSMENT:    1. Coronary artery disease involving native coronary artery of native heart without angina pectoris   2. Paroxysmal atrial fibrillation (HCC)   3. S/P MVR (mitral valve replacement)   4. History of aortic valve replacement with bioprosthetic valve   5. H/O  tricuspid valve repair   6. Depression with anxiety   7. Hypercholesterolemia   8. CHB (complete heart block) (HCC)   9. Pacemaker      PLAN:  In order of problems listed above:   1. CAD: His coronary stenoses were always discovered incidentally on preoperative angiography and he has never had angina.  He has an occluded LAD with patent LIMA bypass and patent stents in his circumflex system.  On anticoagulation so he is not receiving aspirin.  On statin with excellent LDL. 2. AFlutter/fibrillation: No recurrence since his surgery and Maze procedure.  Was prescribed warfarin for his new prosthetic valve.  He has an atricure clip, so in the long-term we could consider stopping his anticoagulation.  For now we will switch back to Xarelto. 3. Bioprosthetic AVR and MVR: He is aware of the need for endocarditis prophylaxis.  Great gradients at his echocardiogram. 4. S/P TV repair: Mild residual leak.  Suspect this will hold out nicely since he also has a remarkable improvement in the degree of pulmonary hypertension after replacement of the mitral valve. 5. Depression: Appears to have stable mood today.  In fact he appears to be in better spirits than I remember in a long time. 6. HLP: On statin, LDL at target less than 70. 7. CHB: Pacemaker dependent.  Described precautions he needs to take with strong electromagnetic field such as metal detectors, MRI scanners, electrocautery, etc. 8. Pacemaker: Normal device function.  Lead outputs decreased to chronic levels today.  Remote downloads every 3 months.  He does have an MRI conditional system.  Medication Adjustments/Labs and Tests Ordered:  Current medicines are reviewed at length with the patient today.  Concerns regarding medicines are outlined above.  Medication changes, Labs and Tests ordered today are listed below. Patient Instructions  Medication Instructions:  No changes *If you need a refill on your cardiac medications before your next  appointment, please call your pharmacy*   Lab Work: None ordered If you have labs (blood work) drawn today and your tests are completely normal, you will receive your results only by: Marland Kitchen MyChart Message (if you have MyChart) OR . A paper copy in the mail If you have any lab test that is abnormal or we need to change your treatment, we will call you to review the results.   Testing/Procedures: None ordered   Follow-Up: At [facility], you and your health needs are our priority.  As part of our continuing mission to provide you with exceptional heart care, we have created designated Provider Care Teams.  These Care Teams include your primary Cardiologist (physician) and Advanced Practice Providers (APPs -  Physician Assistants and Nurse Practitioners) who all work together to provide you with the care you need, when you need it.  We recommend signing up for the patient portal called "MyChart".  Sign up information is provided on this After Visit Summary.  MyChart is used to connect with patients for Virtual Visits (Telemedicine).  Patients are able to view lab/test results, encounter notes, upcoming appointments, etc.  Non-urgent messages can be sent to your provider as well.   To learn more about what you can do with MyChart, go to [email or web address].    Your next appointment:   6 month(s)  The format for your next appointment:   In Person  Provider:   Sanda Klein, MD        Signed, Sanda Klein, MD  01/09/2020 4:09 PM    [facility] [place], [place], [place]  [address] Phone: [phone]; Fax: [phone]

## 2020-01-09 NOTE — Progress Notes (Signed)
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 using a stented bioprosthetic tissue valve, coronary artery disease status post coronary artery bypass grafting and more recently status post PCI and stenting of the posterior descending coronary artery, mitral regurgitation, chronic diastolic congestive heart failure, recurrent atrial fibrillation and atrial flutter status post DC cardioversion on multiple occasions, restless leg syndrome, and remote history of prostate cancer who returns to the office today for routine follow-up status post redo median sternotomy for mitral valve replacement using a bioprosthetic tissue valve, tricuspid valve repair, and Maze procedure on October 05, 2019.  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  He otherwise did well and was last seen here in our office on November 07, 2019 at which time he was doing very well.  Shortly after that he underwent routine follow-up echocardiogram on November 21, 2019 which revealed normal left ventricular systolic function with ejection fraction estimated 55 to 60%.  The bioprosthetic tissue valve in the mitral position was functioning normally with no paravalvular leak.  The tricuspid valve repair remained intact with trivial residual tricuspid regurgitation.  The previously replaced aortic valve prosthesis was also functioning normally.  He returns to our office today and reports that he is feeling  quite well.  He notes that his breathing is dramatically better than it was prior to surgery.  He is back to essentially normal physical activity and recently has been performing some fairly strenuous work in the yard around his house.  He still walks regularly although he admits that he has not been taking 2 extended walks every day since he has more recently been busy or doing chores around the house.  He states that he only feels mild soreness in his chest when he is trying to lift something heavy, such as recently when he was trying to pull a cement post out of the ground.  He denies any palpitations or other symptoms to suggest a recurrence of atrial fibrillation.  He remains anticoagulated using Coumadin.   Current Outpatient Medications  Medication Sig Dispense Refill  . acetaminophen (TYLENOL) 325 MG tablet Take 2 tablets (650 mg total) by mouth every 4 (four) hours as needed for headache or mild pain.    Marland Kitchen albuterol (VENTOLIN HFA) 108 (90 Base) MCG/ACT inhaler Inhale 2 puffs into the lungs every 6 (six) hours as needed for wheezing or shortness of breath.     Marland Kitchen amoxicillin (AMOXIL) 500 MG capsule Take four capsules 30 - 60 minutes before dental appointment. (Patient taking differently: Take 2,000 mg by mouth See admin instructions. Take four capsules (2000 mg) by mouth 30 - 60 minutes before dental appointment.) 4 capsule 2  . aspirin EC 81 MG EC tablet Take 1 tablet (81 mg total) by mouth daily. Swallow whole.    Marland Kitchen atorvastatin (LIPITOR) 40 MG tablet Take 1 tablet (40 mg total) by mouth daily. 30 tablet 11  . doxylamine, Sleep, (  UNISOM) 25 MG tablet Take 12.5 mg by mouth at bedtime.    . gabapentin (NEURONTIN) 300 MG capsule Take 300 mg by mouth See admin instructions. Take one capsule (300 mg) by mouth twice daily - with supper and at bedtime    . Multiple Vitamins-Minerals (PRESERVISION AREDS 2) CAPS Take 1 capsule by mouth every evening.     . Omega-3 Fatty Acids (OMEGA-3 PO) Take 1 capsule  by mouth daily.    . Probiotic Product (PROBIOTIC PO) Take 1 capsule by mouth 2 (two) times daily.     . Turmeric 500 MG CAPS Take 500 mg by mouth every evening.    . warfarin (COUMADIN) 5 MG tablet Take 1 tablet (5 mg total) by mouth daily. 135 tablet 3   No current facility-administered medications for this visit.      Physical Exam:   BP (!) 142/68 (BP Location: Left Arm, Patient Position: Sitting, Cuff Size: Normal)   Pulse 91   Temp 97.7 F (36.5 C) (Skin)   Resp 18   Wt 167 lb 4.8 oz (75.9 kg)   SpO2 96% Comment: RA  BMI 22.69 kg/m   General:  Well-appearing  Chest:   Clear to auscultation  CV:   Regular rate and rhythm without murmur  Incisions:  Completely healed, sternum is stable  Abdomen:  Soft nontender  Extremities:  Warm and well-perfused  Diagnostic Tests:  2 channel telemetry rhythm strip demonstrates sinus rhythm   Impression:  Patient is doing remarkably well approximately 3 months status post mitral valve replacement with a bioprosthetic tissue valve, tricuspid valve repair, and Maze procedure.  His early postoperative recovery was notable for the development of complete heart block for which he underwent permanent pacemaker placement.  He has been maintaining sinus rhythm.  Plan:  We have not recommended any changes to the patient's current medications.  However, it would be reasonable to consider stopping Coumadin and resuming Xarelto which the patient had been taking prior to his surgery last summer.  I have encouraged the patient to continue to increase his activity without any particular limitations.  All of his questions have been addressed.  The patient will continue to follow-up regularly with Dr. Sallyanne Kuster.  He will return to our office for routine follow-up next summer, approximately 1 year following his surgery.  He will call and return sooner only should specific problems or questions arise.      [name]. Roxy Manns, MD 01/09/2020 12:42  PM

## 2020-01-10 ENCOUNTER — Ambulatory Visit (INDEPENDENT_AMBULATORY_CARE_PROVIDER_SITE_OTHER): Payer: Medicare Other

## 2020-01-10 DIAGNOSIS — I442 Atrioventricular block, complete: Secondary | ICD-10-CM

## 2020-01-10 LAB — CUP PACEART REMOTE DEVICE CHECK
Battery Remaining Longevity: 149 mo
Battery Voltage: 3.17 V
Brady Statistic AP VP Percent: 11.57 %
Brady Statistic AP VS Percent: 0.01 %
Brady Statistic AS VP Percent: 88.12 %
Brady Statistic AS VS Percent: 0.3 %
Brady Statistic RA Percent Paced: 11.54 %
Brady Statistic RV Percent Paced: 99.7 %
Date Time Interrogation Session: 20211115225533
Implantable Lead Implant Date: 20210816
Implantable Lead Implant Date: 20210816
Implantable Lead Location: 753859
Implantable Lead Location: 753860
Implantable Lead Model: 3830
Implantable Lead Model: 5076
Implantable Pulse Generator Implant Date: 20210816
Lead Channel Impedance Value: 361 Ohm
Lead Channel Impedance Value: 380 Ohm
Lead Channel Impedance Value: 532 Ohm
Lead Channel Impedance Value: 551 Ohm
Lead Channel Pacing Threshold Amplitude: 0.75 V
Lead Channel Pacing Threshold Amplitude: 0.75 V
Lead Channel Pacing Threshold Pulse Width: 0.4 ms
Lead Channel Pacing Threshold Pulse Width: 0.4 ms
Lead Channel Sensing Intrinsic Amplitude: 31.625 mV
Lead Channel Sensing Intrinsic Amplitude: 31.625 mV
Lead Channel Sensing Intrinsic Amplitude: 7 mV
Lead Channel Sensing Intrinsic Amplitude: 7 mV
Lead Channel Setting Pacing Amplitude: 1.5 V
Lead Channel Setting Pacing Amplitude: 2 V
Lead Channel Setting Pacing Pulse Width: 0.4 ms
Lead Channel Setting Sensing Sensitivity: 0.9 mV

## 2020-01-11 ENCOUNTER — Telehealth: Payer: Self-pay | Admitting: *Deleted

## 2020-01-11 MED ORDER — RIVAROXABAN 20 MG PO TABS: 20.0000 mg | ORAL_TABLET | Freq: Every day | ORAL | 11 refills | Status: DC

## 2020-01-11 NOTE — Telephone Encounter (Signed)
Left a message for the patient to call back.  Per Dr. Sallyanne Kuster, the patient will stop Warfarin and switch to Xarelto 20 mg once daily. The patient will also need to stop the aspirin.   Message sent to the coumadin clinic for their information of the change.

## 2020-01-11 NOTE — Telephone Encounter (Signed)
-----   Message from Sanda Klein, MD sent at 01/09/2020  3:45 PM EST ----- Marya Amsler going to stop his warfarin and he will restart Xarelto 20 mg daily with a meal. Please call in the prescription for him.  I have discussed this with him on the phone.  He would just stop the warfarin and start Xarelto same day.

## 2020-01-12 NOTE — Progress Notes (Signed)
Remote pacemaker transmission.   

## 2020-01-13 NOTE — Telephone Encounter (Signed)
The patient has been made aware.  

## 2020-01-16 ENCOUNTER — Encounter: Payer: Medicare Other | Admitting: Cardiology

## 2020-01-18 ENCOUNTER — Telehealth: Payer: Self-pay | Admitting: Cardiovascular Disease

## 2020-01-18 NOTE — Telephone Encounter (Signed)
STAT if patient feels like he/she is going to faint   1) Are you dizzy now? No   2) Do you feel faint or have you passed out? No   3) Do you have any other symptoms? Headaches, nausea  Have you checked your HR and BP (record if available)? Not available.

## 2020-01-18 NOTE — Telephone Encounter (Signed)
Returned the call to the patient. He stated that he has been having occasional bouts of nausea and headaches. He wonders if this correlates to the settings being changed at his appointment on 01/09/20. He stated that the nausea has only occurred a few times and then is followed by a headache. He denies shortness of breath and chest pain.   He does not check his blood pressure or heart rate at home.  He has been advised to send a download and will get that done.

## 2020-01-18 NOTE — Telephone Encounter (Signed)
Remote transmission reviewed, device function WNL, no episodes to correlate with s/sx reported by patient. Patient notified of findings and reassured that adjusting outputs tyo chronic settings on 01/09/20 would not cause s/sx he reports. Patient advised to contact PCP concerning HA and nausea.

## 2020-01-19 NOTE — Telephone Encounter (Signed)
Reviewed download. All parameters normal. Agree no correlation between symptoms and pacemaker/rhythm.

## 2020-02-18 ENCOUNTER — Emergency Department (HOSPITAL_COMMUNITY): Payer: Medicare Other

## 2020-02-18 ENCOUNTER — Telehealth: Payer: Self-pay | Admitting: Cardiology

## 2020-02-18 ENCOUNTER — Emergency Department (HOSPITAL_COMMUNITY)
Admission: EM | Admit: 2020-02-18 | Discharge: 2020-02-19 | Disposition: A | Discharge status: Home / Self Care | Payer: Medicare Other | Attending: Emergency Medicine | Admitting: Emergency Medicine

## 2020-02-18 DIAGNOSIS — Z8546 Personal history of malignant neoplasm of prostate: Secondary | ICD-10-CM | POA: Diagnosis not present

## 2020-02-18 DIAGNOSIS — I2510 Atherosclerotic heart disease of native coronary artery without angina pectoris: Secondary | ICD-10-CM | POA: Insufficient documentation

## 2020-02-18 DIAGNOSIS — R0789 Other chest pain: Secondary | ICD-10-CM | POA: Insufficient documentation

## 2020-02-18 DIAGNOSIS — R42 Dizziness and giddiness: Secondary | ICD-10-CM | POA: Diagnosis present

## 2020-02-18 DIAGNOSIS — I4892 Unspecified atrial flutter: Secondary | ICD-10-CM | POA: Insufficient documentation

## 2020-02-18 DIAGNOSIS — Z7901 Long term (current) use of anticoagulants: Secondary | ICD-10-CM | POA: Diagnosis not present

## 2020-02-18 DIAGNOSIS — Z951 Presence of aortocoronary bypass graft: Secondary | ICD-10-CM | POA: Diagnosis not present

## 2020-02-18 DIAGNOSIS — Z950 Presence of cardiac pacemaker: Secondary | ICD-10-CM | POA: Insufficient documentation

## 2020-02-18 DIAGNOSIS — I4891 Unspecified atrial fibrillation: Secondary | ICD-10-CM | POA: Diagnosis not present

## 2020-02-18 DIAGNOSIS — R110 Nausea: Secondary | ICD-10-CM | POA: Diagnosis not present

## 2020-02-18 DIAGNOSIS — I110 Hypertensive heart disease with heart failure: Secondary | ICD-10-CM | POA: Diagnosis not present

## 2020-02-18 DIAGNOSIS — I5033 Acute on chronic diastolic (congestive) heart failure: Secondary | ICD-10-CM | POA: Insufficient documentation

## 2020-02-18 DIAGNOSIS — Z87891 Personal history of nicotine dependence: Secondary | ICD-10-CM | POA: Insufficient documentation

## 2020-02-18 DIAGNOSIS — Z79899 Other long term (current) drug therapy: Secondary | ICD-10-CM | POA: Insufficient documentation

## 2020-02-18 LAB — CBC WITH DIFFERENTIAL/PLATELET
Abs Immature Granulocytes: 0.02 10*3/uL (ref 0.00–0.07)
Basophils Absolute: 0 10*3/uL (ref 0.0–0.1)
Basophils Relative: 1 %
Eosinophils Absolute: 0.1 10*3/uL (ref 0.0–0.5)
Eosinophils Relative: 2 %
HCT: 39.9 % (ref 39.0–52.0)
Hemoglobin: 12 g/dL — ABNORMAL LOW (ref 13.0–17.0)
Immature Granulocytes: 0 %
Lymphocytes Relative: 23 %
Lymphs Abs: 1.3 10*3/uL (ref 0.7–4.0)
MCH: 24 pg — ABNORMAL LOW (ref 26.0–34.0)
MCHC: 30.1 g/dL (ref 30.0–36.0)
MCV: 79.8 fL — ABNORMAL LOW (ref 80.0–100.0)
Monocytes Absolute: 0.7 10*3/uL (ref 0.1–1.0)
Monocytes Relative: 13 %
Neutro Abs: 3.4 10*3/uL (ref 1.7–7.7)
Neutrophils Relative %: 61 %
Platelets: 204 10*3/uL (ref 150–400)
RBC: 5 MIL/uL (ref 4.22–5.81)
RDW: 16.2 % — ABNORMAL HIGH (ref 11.5–15.5)
WBC: 5.5 10*3/uL (ref 4.0–10.5)
nRBC: 0 % (ref 0.0–0.2)

## 2020-02-18 LAB — COMPREHENSIVE METABOLIC PANEL
ALT: 18 U/L (ref 0–44)
AST: 26 U/L (ref 15–41)
Albumin: 3.8 g/dL (ref 3.5–5.0)
Alkaline Phosphatase: 48 U/L (ref 38–126)
Anion gap: 7 (ref 5–15)
BUN: 16 mg/dL (ref 8–23)
CO2: 26 mmol/L (ref 22–32)
Calcium: 9.5 mg/dL (ref 8.9–10.3)
Chloride: 106 mmol/L (ref 98–111)
Creatinine, Ser: 1.05 mg/dL (ref 0.61–1.24)
GFR, Estimated: >60 mL/min (ref >60)
Glucose, Bld: 127 mg/dL — ABNORMAL HIGH (ref 70–99)
Potassium: 4.2 mmol/L (ref 3.5–5.1)
Sodium: 139 mmol/L (ref 135–145)
Total Bilirubin: 0.7 mg/dL (ref 0.3–1.2)
Total Protein: 7 g/dL (ref 6.5–8.1)

## 2020-02-18 LAB — PROTIME-INR
INR: 1.7 — ABNORMAL HIGH (ref 0.8–1.2)
Prothrombin Time: 19.6 seconds — ABNORMAL HIGH (ref 11.4–15.2)

## 2020-02-18 LAB — TROPONIN I (HIGH SENSITIVITY): Troponin I (High Sensitivity): 28 ng/L — ABNORMAL HIGH (ref <18)

## 2020-02-18 MED ORDER — MECLIZINE HCL 25 MG PO TABS
12.5000 mg | ORAL_TABLET | Freq: Once | ORAL | Status: AC
  Administered 2020-02-18: 23:00:00 12.5 mg via ORAL
  Filled 2020-02-18: qty 1

## 2020-02-18 NOTE — ED Triage Notes (Signed)
Patient presents from home with CC of dizziness, nausea upon awakening this morning. Reports it was ok while lying flat, but became worse with changing positions. Hx of vertigo. Reports some chest pain prior to going to bed last night, but none with episode of dizziness. Denies symptoms now. Denies weakness, slurred speech. Moves all extremities freely with no drifts/weakness noted. No unilateral deficits noted. Speech clear and appropriate. AAOx4

## 2020-02-18 NOTE — Telephone Encounter (Signed)
Patient called stating that he has had severe nausea and dizziness.  He tells me that he has been having headaches and severe room spinning associated with severe nausea but no emesis.  It is positional and noticed it in bed.  He felt like his eyes were jumping all around with his vision distorted.  His symptoms occurred when he awakened this am around 8am and stayed in bed until 2:30pm and then got up and ate some food.  The symptoms have persisted.  He went back to bed and started having the same symptoms.  Still feeling very dizzy and nauseated with persistent headache with no improvement with Tylenol.  I have told him his symptoms are likely related to acute vertigo but with headache I feel he needs to go to [facility] to be evaluated and make sure he has not had an acute CVA. He will call EMS to be transported since he is 35 minutes away.

## 2020-02-18 NOTE — ED Notes (Signed)
Update given to daughter on bedside phone, OK per patient, patient speaking to daughter at this time. Wife at bedside.

## 2020-02-18 NOTE — ED Notes (Signed)
ED Provider at bedside. 

## 2020-02-18 NOTE — ED Provider Notes (Signed)
[facility] EMERGENCY DEPARTMENT Provider Note   CSN: [id number] Arrival date & time: 02/18/20  2128     History Chief Complaint  Patient presents with  . Dizziness  . Nausea    Alan [name] is a 69 y.o. male.  HPI Patient is a 68 year old male with an extensive medical history noted below.  Anticoagulated on Coumadin.  He presents today due to dizziness.  His symptoms started this morning upon waking.  He states that when lying supine he is asymptomatic.  He would then roll over in bed and began experiencing dizziness that he describes as "the room spinning".  This would typically last about 5 minutes.  He reports associated nausea.  He states that he went back to sleep throughout the day and repetitively woke up and when rolling over would experience similar symptoms.  Reports a history of vertigo in the past but never of the severity.  Denies any unilateral weakness, numbness, chest pain, shortness of breath, that occurred today.  Does note that yesterday and the day prior he felt as if he had to exert himself more than normal on his daily walk.  After his daily walk he would experience left-sided chest tightness that would spontaneously resolve with rest.  No recent illnesses.  No fevers, chills, abdominal pain, vomiting, diarrhea.    Past Medical History:  Diagnosis Date  . 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 Arthritis    hands  . Atrial flutter with rapid ventricular response ([place]) s/p TEE/DCCV 07/28/19 successful 10/19/2014  . CHF (congestive heart failure) ([place])   . Coronary artery disease   . Depression   . Dyspnea   . Dysrhythmia   . 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 Enterococcal bacteremia 10/19/2014  . Heart murmur   . Hepatitis    C  . Hypertension   . Memory difficulty   . Migraine 09/12/2015  . Mini stroke ([place])    x22  . Mitral valve  regurgitation, 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/2014  . Paroxysmal atrial fibrillation ([place]) 10/19/2014  . Paroxysmal atrial flutter ([place]) 10/19/2014  . Prostate cancer ([place]) dx'd 2015   surg only per pt  . Protein-calorie malnutrition, severe ([place])   . Restless leg syndrome   . 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/2014   23 mm [facility] Ease bovine pericardial bioprosthetic tissue valve with bovine pericardial patch enlargement of aortic root  . 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 S/P Maze operation for atrial fibrillation 10/05/2019   Complete bilateral atrial lesion set using cryothermy and bipolar radiofrequency ablation  . S/P mitral valve repair 11/01/2014   Debridement of vegetation on anterior leaflet  . S/P mitral valve replacement with bioprosthetic valve 10/05/2019   29 mm [employer]tronic Mosaic stented porcine bioprosthetic tissue valve  . S/P tricuspid valve repair 10/05/2019   28 mm Edwards mc3 ring annuloplasty  . 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 Severe mitral regurgitation   . 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 Tricuspid regurgitation     Patient Active Problem List   Diagnosis Date Noted  . CHB (complete heart block) ([place]) 01/09/2020  . Pacemaker 10/27/2019  . S/P redo mitral valve replacement with bioprosthetic valve 10/05/2019  . S/P tricuspid valve repair 10/05/2019  . S/P Maze operation for atrial fibrillation 10/05/2019  . S/P MVR (mitral valve replacement) 10/05/2019  .  Tricuspid regurgitation   . Chest pain 07/25/2019  . Dyspnea 05/10/2019  . Acute on chronic diastolic heart failure ([place]) 04/13/2018  . Status post ligation of left atrial appendage 03/20/2018  . H/O mitral valve repair 03/20/2018  . History of aortic valve replacement with bioprosthetic valve 03/20/2018  . Recurrent major depressive disorder ([place]) 03/20/2018  . Memory difficulty 02/06/2017  . Migraine 09/12/2015  . Vertigo 09/12/2015  . Atrial flutter  ([place])   . Typical atrial flutter ([place])   . Anticoagulated 11/17/2014  . 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/2014  . 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  . History of endocarditis 2016   . CAD S/P percutaneous coronary angioplasty 10/23/2014  . Atherosclerotic peripheral vascular disease ([place]) 10/20/2014  . History of stroke 10/20/2014  . Severe mitral regurgitation   . Paroxysmal atrial fibrillation ([place]) 10/19/2014  . Atrial flutter with rapid ventricular response ([place]) s/p TEE/DCCV 07/28/19 successful 10/19/2014  . Prostate cancer ([place]) 07/23/2011  . HTN (hypertension) 08/16/2008  . Depression with anxiety 06/29/2008  . Restless legs syndrome (RLS) 06/21/2007  . Allergic rhinitis 06/21/2007  . Attention deficit disorder 06/21/2007    Past Surgical History:  Procedure Laterality Date  . AORTIC ROOT ENLARGEMENT N/A 11/01/2014   Procedure: AORTIC ROOT ENLARGEMENT;  Surgeon: Rexene Alberts, MD;  Location: [place];  Service: Open Heart Surgery;  Laterality: N/A;  . AORTIC VALVE REPLACEMENT N/A 11/01/2014   Procedure: AORTIC VALVE REPLACEMENT (AVR);  Surgeon: Rexene Alberts, MD;  Location: [place];  Service: Open Heart Surgery;  Laterality: N/A;  . CARDIAC CATHETERIZATION N/A 10/23/2014   Procedure: Right/Left Heart Cath and Coronary Angiography;  Surgeon: Lorretta Harp, MD;  Location: [place] CV LAB;  Service: Cardiovascular;  Laterality: N/A;  . CARDIOVERSION N/A 12/18/2014   Procedure: CARDIOVERSION;  Surgeon: Pixie Casino, MD;  Location: [facility] ENDOSCOPY;  Service: Cardiovascular;  Laterality: N/A;  . CARDIOVERSION N/A 02/06/2015   Procedure: CARDIOVERSION;  Surgeon: Larey Dresser, MD;  Location: [place];  Service: Cardiovascular;  Laterality: N/A;  . CARDIOVERSION N/A 04/07/2018   Procedure: CARDIOVERSION;  Surgeon: Sanda Klein, MD;  Location: [facility] ENDOSCOPY;  Service: Cardiovascular;  Laterality: N/A;  . CARDIOVERSION N/A 07/28/2019   Procedure: CARDIOVERSION;   Surgeon: Lelon Perla, MD;  Location: [facility] ENDOSCOPY;  Service: Cardiovascular;  Laterality: N/A;  . COLONOSCOPY N/A 10/25/2014   Procedure: COLONOSCOPY;  Surgeon: Arta Silence, MD;  Location: [facility] ENDOSCOPY;  Service: Endoscopy;  Laterality: N/A;  . CORONARY ARTERY BYPASS GRAFT N/A 11/01/2014   Procedure: CORONARY ARTERY BYPASS GRAFTING (CABG) x 1 using internal mammary artery;  Surgeon: Rexene Alberts, MD;  Location: [place];  Service: Open Heart Surgery;  Laterality: N/A;  . CORONARY CTO INTERVENTION N/A 04/12/2018   Procedure: CORONARY CTO INTERVENTION;  Surgeon: [name], MD;  Location: [place] CV LAB;  Service: Cardiovascular;  Laterality: N/A;  . CORONARY STENT INTERVENTION N/A 04/12/2018   Procedure: CORONARY STENT INTERVENTION;  Surgeon: [name], MD;  Location: [place] CV LAB;  Service: Cardiovascular;  Laterality: N/A;  . EYE SURGERY Bilateral    cataracts  . MAZE N/A 10/05/2019   Procedure: MAZE;  Surgeon: Rexene Alberts, MD;  Location: [place];  Service: Open Heart Surgery;  Laterality: N/A;  . MITRAL VALVE REPAIR N/A 11/01/2014   Procedure: MITRAL VALVE REPAIR with no Ring;  Surgeon: Rexene Alberts, MD;  Location: [place];  Service: Open Heart Surgery;  Laterality: N/A;  . MITRAL  VALVE REPLACEMENT N/A 10/05/2019   Procedure: MITRAL VALVE (MV) REPLACEMENT USING MOSAIC 29MM VALVE;  Surgeon: Rexene Alberts, MD;  Location: [place];  Service: Open Heart Surgery;  Laterality: N/A;  . PACEMAKER IMPLANT N/A 10/10/2019   Procedure: PACEMAKER IMPLANT;  Surgeon: Evans Lance, MD;  Location: [place] CV LAB;  Service: Cardiovascular;  Laterality: N/A;  . PROSTATECTOMY    . RIGHT/LEFT HEART CATH AND CORONARY ANGIOGRAPHY N/A 09/13/2019   Procedure: RIGHT/LEFT HEART CATH AND CORONARY ANGIOGRAPHY;  Surgeon: Martinique, Peter M, MD;  Location: [place] CV LAB;  Service: Cardiovascular;  Laterality: N/A;  . RIGHT/LEFT HEART CATH AND CORONARY/GRAFT ANGIOGRAPHY N/A 04/12/2018    Procedure: RIGHT/LEFT HEART CATH AND CORONARY/GRAFT ANGIOGRAPHY;  Surgeon: [name], MD;  Location: [place] CV LAB;  Service: Cardiovascular;  Laterality: N/A;  . TEE WITHOUT CARDIOVERSION N/A 10/20/2014   Procedure: TRANSESOPHAGEAL ECHOCARDIOGRAM (TEE);  Surgeon: Josue Hector, MD;  Location: [place];  Service: Cardiovascular;  Laterality: N/A;  . TEE WITHOUT CARDIOVERSION N/A 11/01/2014   Procedure: TRANSESOPHAGEAL ECHOCARDIOGRAM (TEE);  Surgeon: Rexene Alberts, MD;  Location: [place];  Service: Open Heart Surgery;  Laterality: N/A;  . TEE WITHOUT CARDIOVERSION N/A 04/08/2018   Procedure: TRANSESOPHAGEAL ECHOCARDIOGRAM (TEE);  Surgeon: Jerline Pain, MD;  Location: [facility] ENDOSCOPY;  Service: Cardiovascular;  Laterality: N/A;  . TEE WITHOUT CARDIOVERSION N/A 07/28/2019   Procedure: TRANSESOPHAGEAL ECHOCARDIOGRAM (TEE);  Surgeon: Lelon Perla, MD;  Location: [facility] ENDOSCOPY;  Service: Cardiovascular;  Laterality: N/A;  . TEE WITHOUT CARDIOVERSION N/A 10/05/2019   Procedure: TRANSESOPHAGEAL ECHOCARDIOGRAM (TEE);  Surgeon: Rexene Alberts, MD;  Location: [place];  Service: Open Heart Surgery;  Laterality: N/A;  . TONSILLECTOMY    . TRICUSPID VALVE REPLACEMENT N/A 10/05/2019   Procedure: TRICUSPID VALVE REPAIR USING MC3 28MM ANNULOPLASTY RING;  Surgeon: Rexene Alberts, MD;  Location: [place];  Service: Open Heart Surgery;  Laterality: N/A;       Family History  Problem Relation Age of Onset  . Stroke Father     Social History   Tobacco Use  . Smoking status: Former Research scientist (life sciences)  . Smokeless tobacco: Never Used  . Tobacco comment: Smoked a pipe age 73->28  Vaping Use  . Vaping Use: Never used  Substance Use Topics  . Alcohol use: No    Alcohol/week: 0.0 standard drinks    Comment: Drank from age 11->38  . Drug use: No    Home Medications Prior to Admission medications   Medication Sig Start Date End Date Taking? Authorizing Provider  acetaminophen (TYLENOL) 325 MG tablet Take 2  tablets (650 mg total) by mouth every 4 (four) hours as needed for headache or mild pain. 07/28/19   Isaiah Serge, NP  albuterol (VENTOLIN HFA) 108 (90 Base) MCG/ACT inhaler Inhale 2 puffs into the lungs every 6 (six) hours as needed for wheezing or shortness of breath.     [provider]  amoxicillin (AMOXIL) 500 MG capsule Take four capsules 30 - 60 minutes before dental appointment. Patient taking differently: Take 2,000 mg by mouth See admin instructions. Take four capsules (2000 mg) by mouth 30 - 60 minutes before dental appointment. 09/22/19   Lenn Cal, DDS  atorvastatin (LIPITOR) 40 MG tablet Take 1 tablet (40 mg total) by mouth daily. 10/07/18 07/24/28  Croitoru, Mihai, MD  doxylamine, Sleep, (UNISOM) 25 MG tablet Take 12.5 mg by mouth at bedtime.    [provider]  gabapentin (NEURONTIN) 300 MG capsule  Take 300 mg by mouth See admin instructions. Take one capsule (300 mg) by mouth twice daily - with supper and at bedtime    [provider]  Multiple Vitamins-Minerals (PRESERVISION AREDS 2) CAPS Take 1 capsule by mouth every evening.     [provider]  Omega-3 Fatty Acids (OMEGA-3 PO) Take 1 capsule by mouth daily.    [provider]  Probiotic Product (PROBIOTIC PO) Take 1 capsule by mouth 2 (two) times daily.     [provider]  rivaroxaban (XARELTO) 20 MG TABS tablet Take 1 tablet (20 mg total) by mouth daily. 01/11/20   Croitoru, Mihai, MD  Turmeric 500 MG CAPS Take 500 mg by mouth every evening.    [provider]    Allergies    Pollen extract  Review of Systems   Review of Systems  All other systems reviewed and are negative. Ten systems reviewed and are negative for acute change, except as noted in the HPI.   Physical Exam Updated Vital Signs BP (!) 159/97 (BP Location: Left Arm)   Pulse 80   Temp 98.1 F (36.7 C) (Oral)   Resp 14   Ht 6' (1.829 m)   Wt 72.6 kg   SpO2 96%   BMI 21.70 kg/m    Physical Exam Vitals and nursing note reviewed.  Constitutional:      General: He is not in acute distress.    Appearance: Normal appearance. He is not ill-appearing, toxic-appearing or diaphoretic.  HENT:     Head: Normocephalic and atraumatic.     Right Ear: External ear normal.     Left Ear: External ear normal.     Nose: Nose normal.     Mouth/Throat:     Mouth: Mucous membranes are moist.     Pharynx: Oropharynx is clear. No oropharyngeal exudate or posterior oropharyngeal erythema.  Eyes:     General: No scleral icterus.       Right eye: No discharge.        Left eye: No discharge.     Extraocular Movements: Extraocular movements intact.     Conjunctiva/sclera: Conjunctivae normal.     Pupils: Pupils are equal, round, and reactive to light.  Cardiovascular:     Rate and Rhythm: Normal rate and regular rhythm.     Pulses: Normal pulses.     Heart sounds: Murmur heard.  No friction rub. No gallop.      Comments: Systolic murmur appreciated at the right upper sternal border.  No rubs or gallops.  Regular rhythm. Pulmonary:     Effort: Pulmonary effort is normal. No respiratory distress.     Breath sounds: Normal breath sounds. No stridor. No wheezing, rhonchi or rales.  Abdominal:     General: Abdomen is flat.     Palpations: Abdomen is soft.     Tenderness: There is no abdominal tenderness.  Musculoskeletal:        General: Normal range of motion.     Cervical back: Normal range of motion and neck supple. No tenderness.  Skin:    General: Skin is warm and dry.  Neurological:     General: No focal deficit present.     Mental Status: He is alert and oriented to person, place, and time.     Comments: Patient is speaking clearly, coherently, and in complete sentences.  Moving all 4 extremities symmetrically and with ease.  Strength is 5 out of 5 in all 4 extremities.  Distal sensation  intact.  Horizontal nystagmus noted with lateral head movement.  Vertiginous symptoms  reproducible with horizontal head movement.  Psychiatric:        Mood and Affect: Mood normal.        Behavior: Behavior normal.    ED Results / Procedures / Treatments   Labs (all labs ordered are listed, but only abnormal results are displayed) Labs Reviewed  COMPREHENSIVE METABOLIC PANEL - Abnormal; Notable for the following components:      Result Value   Glucose, Bld 127 (*)    All other components within normal limits  CBC WITH DIFFERENTIAL/PLATELET - Abnormal; Notable for the following components:   Hemoglobin 12.0 (*)    MCV 79.8 (*)    MCH 24.0 (*)    RDW 16.2 (*)    All other components within normal limits  PROTIME-INR - Abnormal; Notable for the following components:   Prothrombin Time 19.6 (*)    INR 1.7 (*)    All other components within normal limits  URINALYSIS, ROUTINE W REFLEX MICROSCOPIC - Abnormal; Notable for the following components:   APPearance CLOUDY (*)    Leukocytes,Ua TRACE (*)    Bacteria, UA RARE (*)    All other components within normal limits  TROPONIN I (HIGH SENSITIVITY) - Abnormal; Notable for the following components:   Troponin I (High Sensitivity) 28 (*)    All other components within normal limits  TROPONIN I (HIGH SENSITIVITY) - Abnormal; Notable for the following components:   Troponin I (High Sensitivity) 29 (*)    All other components within normal limits   EKG EKG Interpretation  Date/Time:  Saturday February 18 2020 21:32:35 EST Ventricular Rate:  77 PR Interval:    QRS Duration: 146 QT Interval:  425 QTC Calculation: 481 R Axis:   68 Text Interpretation: ATRIAL PACED RHYTHM Atrial premature complex Right bundle branch block Anterolateral infarct, age indeterminate No significant change since last tracing Confirmed by Blanchie Dessert [phone]) on 02/18/2020 9:44:08 PM  Radiology CT Head Wo Contrast  Result Date: 02/18/2020 CLINICAL DATA:  Dizziness. EXAM: CT HEAD WITHOUT CONTRAST TECHNIQUE: Contiguous axial images were  obtained from the base of the skull through the vertex without intravenous contrast. COMPARISON:  August 08, 2008 FINDINGS: Brain: There is mild cerebral atrophy with widening of the extra-axial spaces and ventricular dilatation. There are areas of decreased attenuation within the white matter tracts of the supratentorial brain, consistent with microvascular disease changes. Small areas of cortical encephalomalacia, with adjacent chronic white matter low attenuation, are seen within the left frontal and left occipital regions. Vascular: No hyperdense vessel or unexpected calcification. Skull: Normal. Negative for fracture or focal lesion. Sinuses/Orbits: No acute finding. Other: None. IMPRESSION: 1. Generalized cerebral atrophy. 2. 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 3. No acute intracranial abnormality. Electronically Signed   By: Virgina Norfolk M.D.   On: 02/18/2020 22:39   Procedures Procedures (including critical care time)  Medications Ordered in ED Medications  meclizine (ANTIVERT) tablet 12.5 mg (12.5 mg Oral Given 02/18/20 2249)   ED Course  I have reviewed the triage vital signs and the nursing notes.  Pertinent labs & imaging results that were available during my care of the patient were reviewed by me and considered in my medical decision making (see chart for details).  Clinical Course as of 02/19/20 0413  Sat Feb 18, 2020  2243 Hemoglobin(!): 12.0 Stable [LJ]  2312 CT Head Wo Contrast IMPRESSION: 1. Generalized cerebral atrophy. 2. Chronic left frontal  and left occipital lobe infarcts. 3. No acute intracranial abnormality. [LJ]  2340 Troponin I (High Sensitivity)(!): 28 [LJ]  Sun Feb 19, 2020  0252 Troponin I (High Sensitivity)(!): 29 [LJ]    Clinical Course User Index [LJ] Rayna Sexton, PA-C   MDM Rules/Calculators/A&P                          Patient is a 69 year old male who presents the emergency department due to dizziness as well as chest  tightness.  Patient reports multiple episodes of vertigo that started this morning.  Worse with head movement.  Reproducible on my exam with head movement.  Horizontal beat nystagmus.  Consistent with a peripheral vertigo.  Patient was discussed with and evaluated by my attending physician who agrees.  Patient also notes chest tightness after ambulation for the past 2 days.  ECG showing an atrial paced rhythm.  Patient's pacemaker was interrogated and was reassuring.  These findings were also evaluated by my attending physician.  Troponin of 28 with a delta of 29.  Patient has a chronically elevated troponin and these are actually improved from priors.  INR elevated at 1.7.  Patient anticoagulated on Coumadin.  States he has been compliant.  Hemoglobin stable at 12.  Electrolytes within normal limits on CMP.  CT scan shows no acute intracranial abnormalities.  Patient ambulated in the emergency department without issue.  Reports improvement in his vertiginous symptoms with meclizine.  Will prescribe a short course of meclizine.  We discussed safety regarding this medication.  Discussed strict return precautions.  He verbalized understanding the above plan.  He was given follow-up with ENT if his symptoms do not improve.  His questions were answered and he was amicable at the time of discharge.   Final Clinical Impression(s) / ED Diagnoses Final diagnoses:  Vertigo    Rx / DC Orders ED Discharge Orders         Ordered    meclizine (ANTIVERT) 12.5 MG tablet  2 times daily PRN        02/19/20 0415           Rayna Sexton, PA-C 02/19/20 0419    Blanchie Dessert, MD 02/20/20 2226

## 2020-02-18 NOTE — ED Notes (Signed)
Patient to CT.

## 2020-02-18 NOTE — ED Notes (Signed)
Patient provided with urinal and made aware of need for urine specimen, verbalized understanding of same.

## 2020-02-19 DIAGNOSIS — R42 Dizziness and giddiness: Secondary | ICD-10-CM | POA: Diagnosis not present

## 2020-02-19 LAB — URINALYSIS, ROUTINE W REFLEX MICROSCOPIC
Bilirubin Urine: NEGATIVE
Glucose, UA: NEGATIVE mg/dL
Hgb urine dipstick: NEGATIVE
Ketones, ur: NEGATIVE mg/dL
Nitrite: NEGATIVE
Protein, ur: NEGATIVE mg/dL
Specific Gravity, Urine: 1.016 (ref 1.005–1.030)
pH: 7 (ref 5.0–8.0)

## 2020-02-19 LAB — TROPONIN I (HIGH SENSITIVITY): Troponin I (High Sensitivity): 29 ng/L — ABNORMAL HIGH (ref <18)

## 2020-02-19 MED ORDER — MECLIZINE HCL 12.5 MG PO TABS: 12.5000 mg | ORAL_TABLET | Freq: Two times a day (BID) | ORAL | 0 refills | Status: AC | PRN

## 2020-02-19 NOTE — ED Notes (Signed)
Ambulated patient in hallway. No complaints of dizziness. Patient independent.

## 2020-02-19 NOTE — ED Notes (Signed)
[employer] fax received and placed on provider desk.

## 2020-02-19 NOTE — Discharge Instructions (Addendum)
Like we discussed, I prescribed you a short course of meclizine.  This is the same medication that I gave you for your vertigo symptoms.  This medication can make you tired.  It can also increase your fall risk.  When you take it please be careful to rest and avoid driving a motor vehicle.  I have also given you follow-up information below for an ear, nose, and throat provider in the area.  Give them a call if you continue to have vertigo-like symptoms.  If your symptoms worsen, please return to the emergency department immediately for reevaluation.  It was a pleasure to meet you both.

## 2020-02-19 NOTE — ED Notes (Signed)
[employer] pacemaker interrogated. Awaiting results.

## 2020-02-19 NOTE — ED Notes (Signed)
Called [employer], they report full fax is in the process of coming over now.

## 2020-04-10 ENCOUNTER — Ambulatory Visit (INDEPENDENT_AMBULATORY_CARE_PROVIDER_SITE_OTHER): Payer: Medicare Other

## 2020-04-10 DIAGNOSIS — I442 Atrioventricular block, complete: Secondary | ICD-10-CM | POA: Diagnosis not present

## 2020-04-10 LAB — CUP PACEART REMOTE DEVICE CHECK
Battery Remaining Longevity: 147 mo
Battery Voltage: 3.14 V
Brady Statistic AP VP Percent: 11.27 %
Brady Statistic AP VS Percent: 0 %
Brady Statistic AS VP Percent: 88.58 %
Brady Statistic AS VS Percent: 0.15 %
Brady Statistic RA Percent Paced: 11.3 %
Brady Statistic RV Percent Paced: 99.85 %
Date Time Interrogation Session: 20220215002004
Implantable Lead Implant Date: 20210816
Implantable Lead Implant Date: 20210816
Implantable Lead Location: 753859
Implantable Lead Location: 753860
Implantable Lead Model: 3830
Implantable Lead Model: 5076
Implantable Pulse Generator Implant Date: 20210816
Lead Channel Impedance Value: 361 Ohm
Lead Channel Impedance Value: 380 Ohm
Lead Channel Impedance Value: 532 Ohm
Lead Channel Impedance Value: 570 Ohm
Lead Channel Pacing Threshold Amplitude: 0.5 V
Lead Channel Pacing Threshold Amplitude: 0.75 V
Lead Channel Pacing Threshold Pulse Width: 0.4 ms
Lead Channel Pacing Threshold Pulse Width: 0.4 ms
Lead Channel Sensing Intrinsic Amplitude: 31.625 mV
Lead Channel Sensing Intrinsic Amplitude: 31.625 mV
Lead Channel Sensing Intrinsic Amplitude: 5.75 mV
Lead Channel Sensing Intrinsic Amplitude: 5.75 mV
Lead Channel Setting Pacing Amplitude: 1.5 V
Lead Channel Setting Pacing Amplitude: 2 V
Lead Channel Setting Pacing Pulse Width: 0.4 ms
Lead Channel Setting Sensing Sensitivity: 0.9 mV

## 2020-04-17 ENCOUNTER — Telehealth: Payer: Self-pay | Admitting: Cardiovascular Disease

## 2020-04-17 MED ORDER — ATORVASTATIN CALCIUM 40 MG PO TABS: 40.0000 mg | ORAL_TABLET | Freq: Every day | ORAL | 11 refills | Status: DC

## 2020-04-17 NOTE — Progress Notes (Signed)
Remote pacemaker transmission.   

## 2020-04-17 NOTE — Telephone Encounter (Signed)
Pt c/o medication issue:  1. Name of Medication: rivaroxaban (XARELTO) 20 MG TABS tablet  2. How are you currently taking this medication (dosage and times per day)? As prescribed   3. Are you having a reaction (difficulty breathing--STAT)? No   4. What is your medication issue?   Patient states he is unable to afford this medication, as it costs him $400/month. He would like to know if there is an alternative he can take. He states he took a different medication in the past, but he does not remember the name of it.

## 2020-04-17 NOTE — Telephone Encounter (Signed)
Returned call to patient who states that his Xarelto now costs him 400$ a month and he can not afford that. Advised patient that he can try Xarelto patient assistance program. Advised patient that 3 bottles of 20mg  of Xarelto will be left at the front desk for patient to pick up along with patient assistance application forms. Advised patient to fill out forms and return to office to be faxed. Advised patient to call back to office with any issues, questions, or concerns. Patient verbalized understanding.

## 2020-04-17 NOTE — Telephone Encounter (Signed)
°*  STAT* If patient is at the pharmacy, call can be transferred to refill team.   1. Which medications need to be refilled? (please list name of each medication and dose if known)  atorvastatin (LIPITOR) 40 MG tablet     2. Which pharmacy/location (including street and city if local pharmacy) is medication to be sent to? [place], [address]  3. Do they need a 30 day or 90 day supply? 90 day supply

## 2020-06-19 ENCOUNTER — Ambulatory Visit: Payer: Medicare Other | Admitting: Cardiovascular Disease

## 2020-06-26 ENCOUNTER — Encounter: Payer: Self-pay | Admitting: *Deleted

## 2020-07-10 ENCOUNTER — Ambulatory Visit (INDEPENDENT_AMBULATORY_CARE_PROVIDER_SITE_OTHER): Payer: Medicare Other

## 2020-07-10 DIAGNOSIS — I442 Atrioventricular block, complete: Secondary | ICD-10-CM | POA: Diagnosis not present

## 2020-07-10 LAB — CUP PACEART REMOTE DEVICE CHECK
Battery Remaining Longevity: 142 mo
Battery Voltage: 3.08 V
Brady Statistic AP VP Percent: 24.04 %
Brady Statistic AP VS Percent: 0 %
Brady Statistic AS VP Percent: 75.85 %
Brady Statistic AS VS Percent: 0.11 %
Brady Statistic RA Percent Paced: 24.06 %
Brady Statistic RV Percent Paced: 99.89 %
Date Time Interrogation Session: 20220517042017
Implantable Lead Implant Date: 20210816
Implantable Lead Implant Date: 20210816
Implantable Lead Location: 753859
Implantable Lead Location: 753860
Implantable Lead Model: 3830
Implantable Lead Model: 5076
Implantable Pulse Generator Implant Date: 20210816
Lead Channel Impedance Value: 361 Ohm
Lead Channel Impedance Value: 361 Ohm
Lead Channel Impedance Value: 513 Ohm
Lead Channel Impedance Value: 551 Ohm
Lead Channel Pacing Threshold Amplitude: 0.625 V
Lead Channel Pacing Threshold Amplitude: 0.875 V
Lead Channel Pacing Threshold Pulse Width: 0.4 ms
Lead Channel Pacing Threshold Pulse Width: 0.4 ms
Lead Channel Sensing Intrinsic Amplitude: 31.625 mV
Lead Channel Sensing Intrinsic Amplitude: 31.625 mV
Lead Channel Sensing Intrinsic Amplitude: 6.5 mV
Lead Channel Sensing Intrinsic Amplitude: 6.5 mV
Lead Channel Setting Pacing Amplitude: 1.5 V
Lead Channel Setting Pacing Amplitude: 2 V
Lead Channel Setting Pacing Pulse Width: 0.4 ms
Lead Channel Setting Sensing Sensitivity: 0.9 mV

## 2020-07-16 ENCOUNTER — Other Ambulatory Visit: Payer: Self-pay

## 2020-07-16 ENCOUNTER — Ambulatory Visit (INDEPENDENT_AMBULATORY_CARE_PROVIDER_SITE_OTHER): Payer: Medicare Other | Admitting: Cardiovascular Disease

## 2020-07-16 ENCOUNTER — Encounter: Payer: Self-pay | Admitting: Cardiovascular Disease

## 2020-07-16 VITALS — BP 140/70 | HR 71 | Ht 72.0 in | Wt 169.8 lb

## 2020-07-16 DIAGNOSIS — I480 Paroxysmal atrial fibrillation: Secondary | ICD-10-CM

## 2020-07-16 DIAGNOSIS — R413 Other amnesia: Secondary | ICD-10-CM

## 2020-07-16 DIAGNOSIS — Z9889 Other specified postprocedural states: Secondary | ICD-10-CM

## 2020-07-16 DIAGNOSIS — E7800 Pure hypercholesterolemia, unspecified: Secondary | ICD-10-CM

## 2020-07-16 DIAGNOSIS — F418 Other specified anxiety disorders: Secondary | ICD-10-CM

## 2020-07-16 DIAGNOSIS — I2510 Atherosclerotic heart disease of native coronary artery without angina pectoris: Secondary | ICD-10-CM | POA: Diagnosis not present

## 2020-07-16 DIAGNOSIS — I442 Atrioventricular block, complete: Secondary | ICD-10-CM

## 2020-07-16 DIAGNOSIS — Z953 Presence of xenogenic heart valve: Secondary | ICD-10-CM | POA: Diagnosis not present

## 2020-07-16 DIAGNOSIS — Z950 Presence of cardiac pacemaker: Secondary | ICD-10-CM

## 2020-07-16 NOTE — Progress Notes (Signed)
Patient ID: Alan [name], male   DOB: [birth year], 70 y.o.   MRN: [id number] Patient ID: Alan [name], male   DOB: 1950/07/17, 70 y.o.   MRN: [id number]    Cardiology Office Note    Date:  07/17/2020   ID:  Alan [name], DOB May 17, 1950, MRN [id number]  PCP:  Lyman Bishop, DO  Cardiologist:   Sanda Klein, MD   Chief Complaint  Patient presents with  . Cardiac Valve Problem    History of Present Illness:  Alan [name] is a 70 y.o. 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 He underwent aortic valve replacement with a biological prosthesis and mitral valve repair as well as a single-vessel LIMA to LAD bypass for incidentally discovered CAD and atricure clipping of the left atrial appendage.  He later had recurrent problems with atrial fibrillation and severe mitral insufficiency.  In August 2021 he had to undergo repeat sternotomy for severe mitral regurgitation and received a bioprosthetic mitral valve replacement and also underwent tricuspid valve annuloplasty repair and surgical maze.  He does not have any cardiovascular complaints and continues have excellent functional status (NYHA class I). The patient specifically denies any chest pain at rest exertion, dyspnea at rest or with exertion, orthopnea, paroxysmal nocturnal dyspnea, syncope, palpitations, focal neurological deficits, intermittent claudication, lower extremity edema, unexplained weight gain, cough, hemoptysis or wheezing.  He was unable to afford Xarelto and stopped it about 5 or 6 weeks ago.  He has not had any bleeding problems.  He does not have a history of stroke or other embolic events and has a clipped appendage.  Recent pacemaker check showed normal device function.  He is pacemaker dependent with complete heart block.  Presenting rhythm  today is a sensed V paced (sinus rhythm, the paced QRS is atypical right bundle branch block morphology with a narrow intrinsically deflection, successful LBBB pacing).  His device did record a single 19 beat episode of nonsustained VT on 04/24.  His biggest concerns are related to his neurological condition.  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  He has developed confusion between words and has difficulty finding the right word.  He is concerned about his memory.  He has also noted problems with his balance.  He has vertigo which has led to at least 1 emergency room visit.  He performs Epley maneuvers every few weeks to keep the symptoms at [place].  His postoperative echocardiogram performed on 11/21/2019 showed excellent findings.  LVEF was 55-60%.  The gradients were normal across the mitral and aortic valve prostheses and across the tricuspid valve annuloplasty repair.  He has moderate biatrial dilation.  The estimated systolic pulmonary artery pressure was dramatically improved at only 28 mmHg.  On November 01, 2014, he underwent AVR with a bioprosthesis (23 mm MagnaEase),patch enlargement of the aortic root (Nicks' technique) and MV repair without annuloplasty ring, as well as single-vessel LIMA to LAD bypass for incidentally discovered CAD and clipping of the left atrial appendage (40 mm Atricure clip). Postop atrial flutter led to treatment with amiodarone and cardioversion on October 24 and a second cardioversion on January 11, 2015.  As the arrhythmia did not recur  anticoagulants were then discontinued, but were restarted when he presented with atrial flutter with 2: 1  AV block in January 2020.  April 12, 2018 received 2 stents to the proximal LAD upstream of the LIMA bypass and the left circumflex coronary artery (resolute onyx 2.030).  In January 2020 he presented with atrial flutter with  2: 1 AV block and exertional dyspnea, but without angina.  He underwent cardioversion on April 07, 2018 and had profound and protracted hypotension that did not respond to IV fluids and pressors.  TEE showed severe  mitral insufficiency.  Cardiac catheterization April 12 2018 was followed by placement of stents in the native LAD artery upstream of the LIMA bypass and the left circumflex coronary artery (both stents were resolute onyx 2.0 x 30).  He improved rapidly thereafter and by physical exam his mitral insufficiency appeared markedly improved.  Follow-up echocardiogram showed only mild mitral insufficiency and normal left ventricular systolic function.  In August 2020 he underwent a stress myocardial perfusion study that did not show any perfusion defects (although he did have "false positive" ST segment depression during Lexiscan infusion).  His echocardiogram performed around the same time showed normal left ventricular ejection fraction at 60-65%, moderately dilated left atrium, normal function of the aortic valve bioprosthesis, mild mitral insufficiency.  He had worsening exertional dyspnea and another echocardiogram performed in May 2021 showed pulmonary hypertension with estimated systolic PA P of 58 mmHg.  On transthoracic echo his mitral regurgitation was estimated to be mild to moderate, but T on July 28, 2019 showed severe mitral regurgitation.  There was also moderate to severe tricuspid regurgitation.  LV function remained normal.  Right and left heart catheterization on July 2021 showed occlusion of the proximal-mid LAD (with patent LIMA to the LAD), patent stents in the first OM and left PDA, 80% stenosis in nondominant right coronary artery.  On October 05, 2019 he underwent repeat sternotomy with mitral valve replacement (Medtronic Mosaic bioprosthesis, 29 mm), tricuspid valve repair Randa Evens MC 3 ring annuloplasty, 28 mm), and maze with cryotherapy and bipolar radiofrequency ablation (Dr.  Cornelius Moras).  He had postoperative complete heart block and on 10/13/2019 received a Medtronic Azure dual-chamber pacemaker (Dr. Ladona Ridgel).  Past Medical History:  Diagnosis Date  . 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 Arthritis    hands  . Atrial flutter with rapid ventricular response (HCC) s/p TEE/DCCV 07/28/19 successful 10/19/2014  . CHF (congestive heart failure) (HCC)   . Coronary artery disease   . Depression   . Dyspnea   . Dysrhythmia   . 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 Enterococcal bacteremia 10/19/2014  . Heart murmur   . Hepatitis    C  . Hypertension   . Memory difficulty   . Migraine 09/12/2015  . Mini stroke (HCC)    x22  . 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 Paroxysmal atrial fibrillation (HCC) 10/19/2014  . Paroxysmal atrial flutter (HCC) 10/19/2014  . Prostate cancer (HCC) dx'd 2015   surg only per pt  . Protein-calorie malnutrition, severe (HCC)   . Restless leg syndrome   . 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/2014   23 mm [facility] Ease bovine pericardial bioprosthetic tissue valve with bovine pericardial patch enlargement of aortic root  . 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 S/P Maze operation for atrial fibrillation 10/05/2019   Complete bilateral atrial lesion set using cryothermy and bipolar radiofrequency ablation  . S/P mitral valve repair 11/01/2014  Debridement of vegetation on anterior leaflet  . S/P mitral valve replacement with bioprosthetic valve 10/05/2019   29 mm Medtronic Mosaic stented porcine bioprosthetic tissue valve  . S/P tricuspid valve repair 10/05/2019   28 mm Edwards mc3 ring annuloplasty  . Septic embolism ([place]) 10/20/2014   brain and spleen, subclinical  . Severe mitral regurgitation   . 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 Tricuspid regurgitation     Past Surgical History:  Procedure Laterality Date   . AORTIC ROOT ENLARGEMENT N/A 11/01/2014   Procedure: AORTIC ROOT ENLARGEMENT;  Surgeon: Rexene Alberts, MD;  Location: [place];  Service: Open Heart Surgery;  Laterality: N/A;  . AORTIC VALVE REPLACEMENT N/A 11/01/2014   Procedure: AORTIC VALVE REPLACEMENT (AVR);  Surgeon: Rexene Alberts, MD;  Location: [place];  Service: Open Heart Surgery;  Laterality: N/A;  . CARDIAC CATHETERIZATION N/A 10/23/2014   Procedure: Right/Left Heart Cath and Coronary Angiography;  Surgeon: Lorretta Harp, MD;  Location: [place] CV LAB;  Service: Cardiovascular;  Laterality: N/A;  . CARDIOVERSION N/A 12/18/2014   Procedure: CARDIOVERSION;  Surgeon: Pixie Casino, MD;  Location: [facility] ENDOSCOPY;  Service: Cardiovascular;  Laterality: N/A;  . CARDIOVERSION N/A 02/06/2015   Procedure: CARDIOVERSION;  Surgeon: Larey Dresser, MD;  Location: [place];  Service: Cardiovascular;  Laterality: N/A;  . CARDIOVERSION N/A 04/07/2018   Procedure: CARDIOVERSION;  Surgeon: Sanda Klein, MD;  Location: [facility] ENDOSCOPY;  Service: Cardiovascular;  Laterality: N/A;  . CARDIOVERSION N/A 07/28/2019   Procedure: CARDIOVERSION;  Surgeon: Lelon Perla, MD;  Location: [facility] ENDOSCOPY;  Service: Cardiovascular;  Laterality: N/A;  . COLONOSCOPY N/A 10/25/2014   Procedure: COLONOSCOPY;  Surgeon: Arta Silence, MD;  Location: [facility] ENDOSCOPY;  Service: Endoscopy;  Laterality: N/A;  . CORONARY ARTERY BYPASS GRAFT N/A 11/01/2014   Procedure: CORONARY ARTERY BYPASS GRAFTING (CABG) x 1 using internal mammary artery;  Surgeon: Rexene Alberts, MD;  Location: [place];  Service: Open Heart Surgery;  Laterality: N/A;  . CORONARY CTO INTERVENTION N/A 04/12/2018   Procedure: CORONARY CTO INTERVENTION;  Surgeon: [name], MD;  Location: [place] CV LAB;  Service: Cardiovascular;  Laterality: N/A;  . CORONARY STENT INTERVENTION N/A 04/12/2018   Procedure: CORONARY STENT INTERVENTION;  Surgeon: [name], MD;  Location: [place] CV LAB;   Service: Cardiovascular;  Laterality: N/A;  . EYE SURGERY Bilateral    cataracts  . MAZE N/A 10/05/2019   Procedure: MAZE;  Surgeon: Rexene Alberts, MD;  Location: [place];  Service: Open Heart Surgery;  Laterality: N/A;  . MITRAL VALVE REPAIR N/A 11/01/2014   Procedure: MITRAL VALVE REPAIR with no Ring;  Surgeon: Rexene Alberts, MD;  Location: [place];  Service: Open Heart Surgery;  Laterality: N/A;  . MITRAL VALVE REPLACEMENT N/A 10/05/2019   Procedure: MITRAL VALVE (MV) REPLACEMENT USING MOSAIC 29MM VALVE;  Surgeon: Rexene Alberts, MD;  Location: [place];  Service: Open Heart Surgery;  Laterality: N/A;  . PACEMAKER IMPLANT N/A 10/10/2019   Procedure: PACEMAKER IMPLANT;  Surgeon: Evans Lance, MD;  Location: [place] CV LAB;  Service: Cardiovascular;  Laterality: N/A;  . PROSTATECTOMY    . RIGHT/LEFT HEART CATH AND CORONARY ANGIOGRAPHY N/A 09/13/2019   Procedure: RIGHT/LEFT HEART CATH AND CORONARY ANGIOGRAPHY;  Surgeon: Martinique, Peter M, MD;  Location: [place] CV LAB;  Service: Cardiovascular;  Laterality: N/A;  . RIGHT/LEFT HEART CATH AND CORONARY/GRAFT ANGIOGRAPHY N/A 04/12/2018   Procedure: RIGHT/LEFT HEART CATH AND CORONARY/GRAFT ANGIOGRAPHY;  Surgeon: Burt Knack,  Legrand Como, MD;  Location: [place] CV LAB;  Service: Cardiovascular;  Laterality: N/A;  . TEE WITHOUT CARDIOVERSION N/A 10/20/2014   Procedure: TRANSESOPHAGEAL ECHOCARDIOGRAM (TEE);  Surgeon: Josue Hector, MD;  Location: [place];  Service: Cardiovascular;  Laterality: N/A;  . TEE WITHOUT CARDIOVERSION N/A 11/01/2014   Procedure: TRANSESOPHAGEAL ECHOCARDIOGRAM (TEE);  Surgeon: Rexene Alberts, MD;  Location: [place];  Service: Open Heart Surgery;  Laterality: N/A;  . TEE WITHOUT CARDIOVERSION N/A 04/08/2018   Procedure: TRANSESOPHAGEAL ECHOCARDIOGRAM (TEE);  Surgeon: Jerline Pain, MD;  Location: [facility] ENDOSCOPY;  Service: Cardiovascular;  Laterality: N/A;  . TEE WITHOUT CARDIOVERSION N/A 07/28/2019   Procedure: TRANSESOPHAGEAL  ECHOCARDIOGRAM (TEE);  Surgeon: Lelon Perla, MD;  Location: [facility] ENDOSCOPY;  Service: Cardiovascular;  Laterality: N/A;  . TEE WITHOUT CARDIOVERSION N/A 10/05/2019   Procedure: TRANSESOPHAGEAL ECHOCARDIOGRAM (TEE);  Surgeon: Rexene Alberts, MD;  Location: [place];  Service: Open Heart Surgery;  Laterality: N/A;  . TONSILLECTOMY    . TRICUSPID VALVE REPLACEMENT N/A 10/05/2019   Procedure: TRICUSPID VALVE REPAIR USING MC3 28MM ANNULOPLASTY RING;  Surgeon: Rexene Alberts, MD;  Location: [place];  Service: Open Heart Surgery;  Laterality: N/A;    Current Outpatient Medications  Medication Sig Dispense Refill  . acetaminophen (TYLENOL) 325 MG tablet Take 2 tablets (650 mg total) by mouth every 4 (four) hours as needed for headache or mild pain.    Marland Kitchen albuterol (VENTOLIN HFA) 108 (90 Base) MCG/ACT inhaler Inhale 2 puffs into the lungs every 6 (six) hours as needed for wheezing or shortness of breath.     Marland Kitchen amoxicillin (AMOXIL) 500 MG capsule Take four capsules 30 - 60 minutes before dental appointment. (Patient taking differently: Take 2,000 mg by mouth See admin instructions. Take four capsules (2000 mg) by mouth 30 - 60 minutes before dental appointment.) 4 capsule 2  . atorvastatin (LIPITOR) 40 MG tablet Take 1 tablet (40 mg total) by mouth daily. 30 tablet 11  . BIOTIN PO Take 1 tablet by mouth daily.    . calcium-vitamin D (OSCAL WITH D) 500-200 MG-UNIT tablet Take 1 tablet by mouth daily.    . diphenhydrAMINE HCl, Sleep, (UNISOM SLEEPMELTS PO) Take 1 tablet by mouth at bedtime.    Marland Kitchen doxylamine, Sleep, (UNISOM) 25 MG tablet Take 12.5 mg by mouth at bedtime.    . gabapentin (NEURONTIN) 300 MG capsule Take 300 mg by mouth See admin instructions. Take one capsule (300 mg) by mouth twice daily - with supper and at bedtime    . meclizine (ANTIVERT) 12.5 MG tablet Take 1 tablet (12.5 mg total) by mouth 2 (two) times daily as needed for dizziness. 12 tablet 0  . Multiple Vitamins-Minerals (CENTRUM  SILVER 50+MEN PO) Take 1 tablet by mouth daily.    . Multiple Vitamins-Minerals (PRESERVISION AREDS 2) CAPS Take 1 capsule by mouth daily.    . Omega-3 Fatty Acids (OMEGA-3 PO) Take 1 capsule by mouth daily.    . Probiotic Product (PROBIOTIC PO) Take 1 capsule by mouth 2 (two) times daily.     . Turmeric 500 MG CAPS Take 500 mg by mouth daily.     No current facility-administered medications for this visit.    Allergies:   Pollen extract   Social History   Socioeconomic History  . Marital status: Married    Spouse name: Not on file  . Number of children: 1  . Years of education: Master's  . Highest education level: Not on file  Occupational History  . Occupation: [employer]    Employer: [employer]  Tobacco Use  . Smoking status: Former Research scientist (life sciences)  . Smokeless tobacco: Never Used  . Tobacco comment: Smoked a pipe age 80->28  Vaping Use  . Vaping Use: Never used  Substance and Sexual Activity  . Alcohol use: No    Alcohol/week: 0.0 standard drinks    Comment: Drank from age 20->38  . Drug use: No  . Sexual activity: Not on file  Other Topics Concern  . Not on file  Social History Narrative   Lives at home w/ his wife   Right-handed   Caffeine: 2 cups daily   Social Determinants of Health   Financial Resource Strain: Not on file  Food Insecurity: Not on file  Transportation Needs: Not on file  Physical Activity: Not on file  Stress: Not on file  Social Connections: Not on file     Family History:  The patient's family history includes Stroke in his father.   ROS:   Please see the history of present illness.    ROS All other systems reviewed and are negative.   PHYSICAL EXAM:   VS:  BP 140/70   Pulse 71   Ht 6' (1.829 m)   Wt 169 lb 12.8 oz (77 kg)   SpO2 98%   BMI 23.03 kg/m      General: Alert, oriented x3, no distress, appears well, fit Head: no evidence of trauma, PERRL, EOMI, no exophtalmos or lid lag, no myxedema, no xanthelasma; normal ears, nose and  oropharynx Neck: normal jugular venous pulsations and no hepatojugular reflux; brisk carotid pulses without delay and no carotid bruits Chest: clear to auscultation, no signs of consolidation by percussion or palpation, normal fremitus, symmetrical and full respiratory excursions Cardiovascular: normal position and quality of the apical impulse, regular rhythm, normal first and second heart sounds, 2/6 aortic ejection murmur radiating towards the base of the neck, 1/6 holosystolic murmur heard best at the left lower sternal border, no diastolic murmurs, rubs or gallops.  Well-healed sternotomy scar and pacemaker site left subclavian area. Abdomen: no tenderness or distention, no masses by palpation, no abnormal pulsatility or arterial bruits, normal bowel sounds, no hepatosplenomegaly Extremities: no clubbing, cyanosis or edema; 2+ radial, ulnar and brachial pulses bilaterally; 2+ right femoral, posterior tibial and dorsalis pedis pulses; 2+ left femoral, posterior tibial and dorsalis pedis pulses; no subclavian or femoral bruits Neurological: grossly nonfocal Psych: Normal mood and affect   Wt Readings from Last 3 Encounters:  07/16/20 169 lb 12.8 oz (77 kg)  02/18/20 160 lb (72.6 kg)  01/09/20 167 lb 4.8 oz (75.9 kg)    Studies/Labs Reviewed:   ECHO 11/21/2019: 1. 1 months post MV replacement and TV repair. Both prosthesis are  functioning properly with only trivial mitral and tricuspid regurgitation.  2. Left ventricular ejection fraction, by estimation, is 55 to 60%. The  left ventricle has normal function. The left ventricle has no regional  wall motion abnormalities. There is severe concentric left ventricular  hypertrophy. Left ventricular diastolic  function could not be evaluated.  3. Right ventricular systolic function is mildly reduced. The right  ventricular size is moderately enlarged. There is normal pulmonary artery  systolic pressure. The estimated right ventricular  systolic pressure is  60.7 mmHg.  4. Left atrial size was moderately dilated.  5. Right atrial size was moderately dilated.  6. The mitral valve has been repaired/replaced. Trivial mitral valve  regurgitation. No evidence of mitral stenosis.  There is a 29 mm Medtronic  Mosaic stented porcine model #310 present in the mitral position.  Procedure Date: 10/05/19. Echo findings are  consistent with normal structure and function of the mitral valve  prosthesis.  7. Edwards mc3 ring annuloplasty (size 70mm). The tricuspid valve is has  been repaired/replaced.  8. The aortic valve has been repaired/replaced. Aortic valve  regurgitation is not visualized. No aortic stenosis is present. There is a  bioprosthetic valve present in the aortic position. Echo findings are  consistent with normal structure and function  of the aortic valve prosthesis. Aortic valve mean gradient measures 6.0  mmHg.  9. The inferior vena cava is normal in size with greater than 50%  respiratory variability, suggesting right atrial pressure of 3 mmHg.   KG:  EKG is ordered today.  It shows atrial paced, ventricular sensed rhythm.  The ventricular paced beats have RBBB morphology, with Q waves in inferior leads and V5-V6.  The QRS is 158 ms, QTC 495 ms. Recent Labs: 07/25/2019: B Natriuretic Peptide 458.3 10/06/2019: Magnesium 2.3 02/18/2020: ALT 18; BUN 16; Creatinine, Ser 1.05; Hemoglobin 12.0; Platelets 204; Potassium 4.2; Sodium 139   Lipid Panel    Component Value Date/Time   CHOL 154 03/06/2016 0954   TRIG 71 03/06/2016 0954   HDL 45 03/06/2016 0954   CHOLHDL 3.4 03/06/2016 0954   VLDL 14 03/06/2016 0954   LDLCALC 95 03/06/2016 0954   01/26/2019 Total cholesterol 119, HDL 45, triglycerides 83, LDL 58  ASSESSMENT:    1. S/P mitral valve replacement with bioprosthetic valve   2. Coronary artery disease involving native coronary artery of native heart without angina pectoris   3. Paroxysmal atrial  fibrillation (HCC)   4. History of aortic valve replacement with bioprosthetic valve   5. History of tricuspid valve repair   6. Memory impairment   7. Depression with anxiety   8. Hypercholesterolemia   9. CHB (complete heart block) (HCC)   10. Pacemaker      PLAN:  In order of problems listed above:   1. CAD: He does not have angina.  His coronary problems were identified incidentally on preoperative angiography.   He has an occluded LAD with patent LIMA bypass and patent stents in his circumflex system.  On anticoagulation so he is not receiving aspirin.  On statin with excellent LDL. 2. AFlutter/fibrillation: There has been no recurrence since his surgery and Maze procedure.  He has an Atricure clip.  He is no longer on anticoagulation. 3. Bioprosthetic AVR and MVR: Repeat echo this fall.  Aware of the need for endocarditis prophylaxis. 4. S/P TV repair: Faint murmur of tricuspid regurgitation heard today.  Since his PA pressure has normalized this should hold together well.  Reevaluate by echo later this year. 5. Depression: Seems to be well compensated.  He reports that he is taking sertraline, unknown dose, although this is not on the list of medications I have available.  But has concerns regarding his health, his wife's memory problems and their financial situation.  He is concerned that he may be developing dementia.  We will refer him for neurology evaluation and Mini-Mental Status Examination.  It is possible that he has some residual "post pump" cortical dysfunction.  If an MRI of the brain is deemed necessary, his device is MRI conditional and he can have the scan. 6. HLP: On statin with LDL 58. 7. CHB: Pacemaker dependent.   8. Pacemaker: Normal device function. Device monitored by Dr.  Quentin Ore.  Normal remote download just a few days ago.  He does have an MRI conditional system.  Medication Adjustments/Labs and Tests Ordered: Current medicines are reviewed at length with the  patient today.  Concerns regarding medicines are outlined above.  Medication changes, Labs and Tests ordered today are listed below. Patient Instructions  Medication Instructions:  No changes *If you need a refill on your cardiac medications before your next appointment, please call your pharmacy*   Lab Work: None ordered If you have labs (blood work) drawn today and your tests are completely normal, you will receive your results only by: Marland Kitchen MyChart Message (if you have MyChart) OR . A paper copy in the mail If you have any lab test that is abnormal or we need to change your treatment, we will call you to review the results.   Testing/Procedures: Your physician has requested that you have an echocardiogram in October. Echocardiography is a painless test that uses sound waves to create images of your heart. It provides your doctor with information about the size and shape of your heart and how well your heart's chambers and valves are working. You may receive an ultrasound enhancing agent through an IV if needed to better visualize your heart during the echo.This procedure takes approximately one hour. There are no restrictions for this procedure. This will take place at the [address].     Follow-Up: At [facility], you and your health needs are our priority.  As part of our continuing mission to provide you with exceptional heart care, we have created designated Provider Care Teams.  These Care Teams include your primary Cardiologist (physician) and Advanced Practice Providers (APPs -  Physician Assistants and Nurse Practitioners) who all work together to provide you with the care you need, when you need it.  We recommend signing up for the patient portal called "MyChart".  Sign up information is provided on this After Visit Summary.  MyChart is used to connect with patients for Virtual Visits (Telemedicine).  Patients are able to view lab/test results, encounter notes,  upcoming appointments, etc.  Non-urgent messages can be sent to your provider as well.   To learn more about what you can do with MyChart, go to [email or web address].    Your next appointment:   12 month(s)  The format for your next appointment:   In Person  Provider:   Sanda Klein, MD   Other Instructions A referral has been placed to Neurology.       Signed, Sanda Klein, MD  07/17/2020 8:19 AM    [facility] [address], [place], [place]  [address] Phone: [phone]; Fax: [phone]

## 2020-07-16 NOTE — Patient Instructions (Signed)
Medication Instructions:  No changes *If you need a refill on your cardiac medications before your next appointment, please call your pharmacy*   Lab Work: None ordered If you have labs (blood work) drawn today and your tests are completely normal, you will receive your results only by: Marland Kitchen MyChart Message (if you have MyChart) OR . A paper copy in the mail If you have any lab test that is abnormal or we need to change your treatment, we will call you to review the results.   Testing/Procedures: Your physician has requested that you have an echocardiogram in October. Echocardiography is a painless test that uses sound waves to create images of your heart. It provides your doctor with information about the size and shape of your heart and how well your heart's chambers and valves are working. You may receive an ultrasound enhancing agent through an IV if needed to better visualize your heart during the echo.This procedure takes approximately one hour. There are no restrictions for this procedure. This will take place at the [address].     Follow-Up: At [facility], you and your health needs are our priority.  As part of our continuing mission to provide you with exceptional heart care, we have created designated Provider Care Teams.  These Care Teams include your primary Cardiologist (physician) and Advanced Practice Providers (APPs -  Physician Assistants and Nurse Practitioners) who all work together to provide you with the care you need, when you need it.  We recommend signing up for the patient portal called "MyChart".  Sign up information is provided on this After Visit Summary.  MyChart is used to connect with patients for Virtual Visits (Telemedicine).  Patients are able to view lab/test results, encounter notes, upcoming appointments, etc.  Non-urgent messages can be sent to your provider as well.   To learn more about what you can do with MyChart, go to  [email or web address].    Your next appointment:   12 month(s)  The format for your next appointment:   In Person  Provider:   Sanda Klein, MD   Other Instructions A referral has been placed to Neurology.

## 2020-07-17 ENCOUNTER — Encounter: Payer: Self-pay | Admitting: Cardiovascular Disease

## 2020-08-02 NOTE — Progress Notes (Signed)
Remote pacemaker transmission.   

## 2020-08-29 ENCOUNTER — Ambulatory Visit (INDEPENDENT_AMBULATORY_CARE_PROVIDER_SITE_OTHER): Payer: Medicare Other | Admitting: Neurology

## 2020-08-29 ENCOUNTER — Encounter: Payer: Self-pay | Admitting: Neurology

## 2020-08-29 ENCOUNTER — Other Ambulatory Visit: Payer: Self-pay

## 2020-08-29 VITALS — BP 125/76 | HR 83 | Ht 72.0 in | Wt 168.0 lb

## 2020-08-29 DIAGNOSIS — G43919 Migraine, unspecified, intractable, without status migrainosus: Secondary | ICD-10-CM

## 2020-08-29 DIAGNOSIS — G3184 Mild cognitive impairment, so stated: Secondary | ICD-10-CM | POA: Diagnosis not present

## 2020-08-29 DIAGNOSIS — I2510 Atherosclerotic heart disease of native coronary artery without angina pectoris: Secondary | ICD-10-CM | POA: Diagnosis not present

## 2020-08-29 DIAGNOSIS — R42 Dizziness and giddiness: Secondary | ICD-10-CM

## 2020-08-29 MED ORDER — AIMOVIG 140 MG/ML ~~LOC~~ SOAJ: 140.0000 mg | SUBCUTANEOUS | 4 refills | Status: DC

## 2020-08-29 NOTE — Progress Notes (Signed)
Reason for visit: Memory problems, headache  Referring physician: Dr. Margorie John is a 70 y.o. male  History of present illness:  Mr. 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  The patient has been seen off and on through this office, the last time was in 2018.  At that point he was having fairly frequent headaches and reporting some mild memory problems.  The patient had been treated in the past with Topamax for his migraine and then he was converted to Depakote, but he is no longer on this medication.  He currently is on gabapentin as well.  The patient reports that his headaches are very frequent, they occur 70 to 90% of the days of the month.  The headaches may be associated with alternating numbness on one side of the body or the other.  The numbness may involve the face, arm, and leg, and are not associated with any weakness or clumsiness.  The patient may take 2 or 3 Tylenol and is able to control the headache within about 30 minutes.  The patient may have some nausea with the headaches, he reports no significant issues with photophobia or phonophobia.  He has had episodes of benign positional vertigo, he has used Epley maneuvers with good benefit.  He recently had a CT scan of the brain in December 2021 that showed no acute changes.  The patient reports some ongoing troubles with the memory, he reports word finding issues and short-term memory problems.  He has not changed any of his activities of daily living, he is able to operate a motor vehicle, manages medications and appointments without difficulty.  His wife does the finances.  He does have some occasional problems with fatigue, he has chronic mild gait instability but he has had no recent falls.   He has had depression in the past, he remains on Zoloft, he denies that the depression is significant at this point.  He is sent to this office for further evaluation.  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   Enterococcal bacteremia 10/19/2014   Heart murmur    Hepatitis    C   Hypertension    Memory difficulty    Migraine 09/12/2015   Mini stroke ([place])    x22   Mitral valve regurgitation, 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/2014   Paroxysmal atrial fibrillation ([place]) 10/19/2014   Paroxysmal atrial flutter ([place]) 10/19/2014   Prostate cancer ([place]) dx'd 2015   surg only per pt   Protein-calorie malnutrition, severe (HCC)    Restless leg syndrome    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/2014   23 mm [facility] Ease bovine pericardial bioprosthetic tissue valve with bovine pericardial patch enlargement of aortic root   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   Tricuspid regurgitation     Past Surgical History:  Procedure Laterality  Date   AORTIC ROOT ENLARGEMENT N/A 11/01/2014   Procedure: AORTIC ROOT ENLARGEMENT;  Surgeon: Rexene Alberts, MD;  Location: [place];  Service: Open Heart Surgery;  Laterality: N/A;   AORTIC VALVE REPLACEMENT N/A 11/01/2014   Procedure: AORTIC VALVE REPLACEMENT (AVR);  Surgeon: Rexene Alberts, MD;  Location: [place];  Service: Open Heart Surgery;  Laterality: N/A;   CARDIAC CATHETERIZATION N/A 10/23/2014   Procedure: Right/Left Heart Cath and Coronary Angiography;  Surgeon: Lorretta Harp, MD;  Location: [place] [facility];  Service: Cardiovascular;  Laterality: N/A;   CARDIOVERSION N/A 12/18/2014   Procedure: CARDIOVERSION;  Surgeon: Pixie Casino, MD;  Location: [facility] ENDOSCOPY;  Service: Cardiovascular;  Laterality: N/A;   CARDIOVERSION N/A 02/06/2015   Procedure: CARDIOVERSION;  Surgeon: Larey Dresser, MD;  Location: [place];  Service: Cardiovascular;  Laterality: N/A;   CARDIOVERSION N/A 04/07/2018   Procedure: CARDIOVERSION;  Surgeon: Sanda Klein, MD;  Location: [place] ENDOSCOPY;  Service: Cardiovascular;  Laterality: N/A;   CARDIOVERSION N/A 07/28/2019   Procedure: CARDIOVERSION;  Surgeon: Lelon Perla, MD;  Location: [facility];  Service: Cardiovascular;  Laterality: N/A;   COLONOSCOPY N/A 10/25/2014   Procedure: COLONOSCOPY;  Surgeon: Arta Silence, MD;  Location: [facility] ENDOSCOPY;  Service: Endoscopy;  Laterality: N/A;   CORONARY ARTERY BYPASS GRAFT N/A 11/01/2014   Procedure: CORONARY ARTERY BYPASS GRAFTING (CABG) x 1 using internal mammary artery;  Surgeon: Rexene Alberts, MD;  Location: [place];  Service: Open Heart Surgery;  Laterality: N/A;   CORONARY CTO INTERVENTION N/A 04/12/2018   Procedure: CORONARY CTO INTERVENTION;  Surgeon: [name], MD;  Location: [place] [facility];  Service: Cardiovascular;  Laterality: N/A;   CORONARY STENT INTERVENTION N/A 04/12/2018   Procedure: CORONARY STENT INTERVENTION;  Surgeon: [name], MD;  Location: [place] [facility];  Service:  Cardiovascular;  Laterality: N/A;   EYE SURGERY Bilateral    cataracts   MAZE N/A 10/05/2019   Procedure: MAZE;  Surgeon: Rexene Alberts, MD;  Location: [place];  Service: Open Heart Surgery;  Laterality: N/A;   MITRAL VALVE REPAIR N/A 11/01/2014   Procedure: MITRAL VALVE REPAIR with no Ring;  Surgeon: Rexene Alberts, MD;  Location: [place];  Service: Open Heart Surgery;  Laterality: N/A;   MITRAL VALVE REPLACEMENT N/A 10/05/2019   Procedure: MITRAL VALVE (MV) REPLACEMENT USING MOSAIC 29MM VALVE;  Surgeon: Rexene Alberts, MD;  Location: [place];  Service: Open Heart Surgery;  Laterality: N/A;   PACEMAKER IMPLANT N/A 10/10/2019   Procedure: PACEMAKER IMPLANT;  Surgeon: Evans Lance, MD;  Location: [place] [facility];  Service: Cardiovascular;  Laterality: N/A;   PROSTATECTOMY     RIGHT/LEFT HEART CATH AND CORONARY ANGIOGRAPHY N/A 09/13/2019   Procedure: RIGHT/LEFT HEART CATH AND CORONARY ANGIOGRAPHY;  Surgeon: Martinique, Peter M, MD;  Location: [place] [facility];  Service: Cardiovascular;  Laterality: N/A;   RIGHT/LEFT HEART CATH AND CORONARY/GRAFT ANGIOGRAPHY  N/A 04/12/2018   Procedure: RIGHT/LEFT HEART CATH AND CORONARY/GRAFT ANGIOGRAPHY;  Surgeon: [name], MD;  Location: [place] [facility];  Service: Cardiovascular;  Laterality: N/A;   TEE WITHOUT CARDIOVERSION N/A 10/20/2014   Procedure: TRANSESOPHAGEAL ECHOCARDIOGRAM (TEE);  Surgeon: Josue Hector, MD;  Location: [place];  Service: Cardiovascular;  Laterality: N/A;   TEE WITHOUT CARDIOVERSION N/A 11/01/2014   Procedure: TRANSESOPHAGEAL ECHOCARDIOGRAM (TEE);  Surgeon: Rexene Alberts, MD;  Location: [place];  Service: Open Heart Surgery;  Laterality: N/A;   TEE WITHOUT CARDIOVERSION N/A 04/08/2018   Procedure: TRANSESOPHAGEAL ECHOCARDIOGRAM (TEE);  Surgeon: Jerline Pain, MD;  Location: [facility] ENDOSCOPY;  Service: Cardiovascular;  Laterality: N/A;   TEE WITHOUT CARDIOVERSION N/A 07/28/2019   Procedure: TRANSESOPHAGEAL ECHOCARDIOGRAM (TEE);   Surgeon: Lelon Perla, MD;  Location: [facility] ENDOSCOPY;  Service: Cardiovascular;  Laterality: N/A;   TEE WITHOUT CARDIOVERSION N/A 10/05/2019   Procedure: TRANSESOPHAGEAL ECHOCARDIOGRAM (TEE);  Surgeon: Rexene Alberts, MD;  Location: [place];  Service: Open Heart Surgery;  Laterality: N/A;   TONSILLECTOMY     TRICUSPID VALVE REPLACEMENT N/A 10/05/2019   Procedure: TRICUSPID VALVE REPAIR USING MC3 28MM ANNULOPLASTY RING;  Surgeon: Rexene Alberts, MD;  Location: [place];  Service: Open Heart Surgery;  Laterality: N/A;    Family History  Problem Relation Age of Onset   Stroke Father     Social history:  reports that he has quit smoking. He has never used smokeless tobacco. He reports that he does not drink alcohol and does not use drugs.  Medications:  Prior to Admission medications   Medication Sig Start Date End Date Taking? Authorizing Provider  acetaminophen (TYLENOL) 325 MG tablet Take 2 tablets (650 mg total) by mouth every 4 (four) hours as needed for headache or mild pain. 07/28/19  Yes Isaiah Serge, NP  albuterol (VENTOLIN HFA) 108 (90 Base) MCG/ACT inhaler Inhale 2 puffs into the lungs every 6 (six) hours as needed for wheezing or shortness of breath.    Yes [provider]  amoxicillin (AMOXIL) 500 MG capsule Take four capsules 30 - 60 minutes before dental appointment. Patient taking differently: Take 2,000 mg by mouth See admin instructions. Take four capsules (2000 mg) by mouth 30 - 60 minutes before dental appointment. 09/22/19  Yes Lenn Cal, DDS  BIOTIN PO Take 1 tablet by mouth daily.   Yes [provider]  calcium-vitamin D (OSCAL WITH D) 500-200 MG-UNIT tablet Take 1 tablet by mouth daily.   Yes [provider]  diphenhydrAMINE HCl, Sleep, (UNISOM SLEEPMELTS PO) Take 1 tablet by mouth at bedtime.   Yes [provider]  doxylamine, Sleep, (UNISOM) 25 MG tablet Take 12.5 mg by mouth at bedtime.   Yes [provider]   gabapentin (NEURONTIN) 300 MG capsule Take 300 mg by mouth See admin instructions. Take one capsule (300 mg) by mouth twice daily - with supper and at bedtime   Yes [provider]  meclizine (ANTIVERT) 12.5 MG tablet Take 1 tablet (12.5 mg total) by mouth 2 (two) times daily as needed for dizziness. 02/19/20  Yes Rayna Sexton, PA-C  Multiple Vitamins-Minerals (CENTRUM SILVER 50+MEN PO) Take 1 tablet by mouth daily.   Yes [provider]  Multiple Vitamins-Minerals (PRESERVISION AREDS 2) CAPS Take 1 capsule by mouth daily.   Yes [provider]  Omega-3 Fatty Acids (OMEGA-3 PO) Take 1 capsule by mouth daily.   Yes [provider]  Probiotic Product (PROBIOTIC PO) Take  1 capsule by mouth 2 (two) times daily.    Yes [provider]  Turmeric 500 MG CAPS Take 500 mg by mouth daily.   Yes [provider]  atorvastatin (LIPITOR) 40 MG tablet Take 1 tablet (40 mg total) by mouth daily. 04/17/20 07/16/20  Croitoru, Mihai, MD      Allergies  Allergen Reactions   Pollen Extract Other (See Comments)    sneezing    ROS:  Out of a complete 14 system review of symptoms, the patient complains only of the following symptoms, and all other reviewed systems are negative.  Headache Numbness Depression Fatigue  Blood pressure 125/76, pulse 83, height 6' (1.829 m), weight 168 lb (76.2 kg).  Physical Exam  General: The patient is alert and cooperative at the time of the examination.  Eyes: Pupils are equal, round, and reactive to light. Discs are flat bilaterally.  Neck: The neck is supple, no carotid bruits are noted.  Respiratory: The respiratory examination is clear.  Cardiovascular: The cardiovascular examination reveals a regular rate and rhythm, a grade III/VI systolic ejection murmur is noted, maximally in the aortic area.  Skin: Extremities are without significant edema.  Neurologic Exam  Mental status: The patient is alert and  oriented x 3 at the time of the examination. The patient has apparent normal recent and remote memory, with an apparently normal attention span and concentration ability.  The Mini-Mental status examination done today shows a total score 30/30.  Cranial nerves: Facial symmetry is present. There is good sensation of the face to pinprick and soft touch bilaterally. The strength of the facial muscles and the muscles to head turning and shoulder shrug are normal bilaterally. Speech is well enunciated, no aphasia or dysarthria is noted. Extraocular movements are full. Visual fields are full. The tongue is midline, and the patient has symmetric elevation of the soft palate. No obvious hearing deficits are noted.  Motor: The motor testing reveals 5 over 5 strength of all 4 extremities. Good symmetric motor tone is noted throughout.  Sensory: Sensory testing is intact to pinprick, soft touch, vibration sensation, and position sense on all 4 extremities. No evidence of extinction is noted.  Coordination: Cerebellar testing reveals good finger-nose-finger and heel-to-shin bilaterally.  Gait and station: Gait is minimally wide-based, the patient walk independently.  Tandem gait is unsteady.  Romberg is negative but is slightly unsteady.  No drift is seen.  Reflexes: Deep tendon reflexes are symmetric and normal bilaterally. Toes are downgoing bilaterally.   Assessment/Plan:  1.  Mild memory disturbance, mild cognitive impairment  2.  Intractable headache  3.  History of bihemispheric infarcts  The patient is having very frequent headaches.  He is having some alternating sensory changes on one side of the body or the other that may be associated with migraine.  The patient will be started on Aimovig taking 140 mg every 30 days.  He will follow-up in 4 months.  We will follow the memory issues over time, his cognitive changes are quite minimal.  The patient reports a history of ADD and he continues to have  problems with completing tasks throughout the day and problems with focusing.  In the future, this patient can be followed by Dr. Jaynee Eagles.  Jill Alexanders MD 08/29/2020 4:05 PM  [facility] [address] [place], [place] [address]  Phone [phone] Fax [phone]

## 2020-08-29 NOTE — Patient Instructions (Signed)
We will start [place] for the headache.

## 2020-08-30 ENCOUNTER — Encounter: Payer: Self-pay | Admitting: *Deleted

## 2020-08-30 ENCOUNTER — Telehealth: Payer: Self-pay | Admitting: *Deleted

## 2020-08-30 NOTE — Telephone Encounter (Signed)
Unable to complete aimovig PA on CMM. Called [facility], pharmacy line PA dept, spoke with Ronny Bacon and advised need to initiate PA,  G43.919. tried/failed topamax, depakote, gabapentin, is on zoloft, history of strokes. Approved 08/30/20- 02/23/2021. PA -[id number]. Faxed approval to [employer]. Sent my chart.

## 2020-09-06 ENCOUNTER — Telehealth: Payer: Self-pay | Admitting: Neurology

## 2020-09-06 NOTE — Telephone Encounter (Signed)
Pt called wanting to discuss his migraine medication. Pt states that he was prescribed  Erenumab-aooe (AIMOVIG) 140 MG/ML SOAJ but the pharmacy informed him that it will cost him $400 a month. Pt would like to know if there si another option. Please advise.

## 2020-09-06 NOTE — Telephone Encounter (Signed)
[facility], spoke with [name] who stated he has not met his deductible. That is why the cost is so high.  Called patient, LVM advising he can call his insurance and ask what CGRPs are on his plan. However he will still have to meet the deductible. Left # for questions.   Patient immediately called back and I advised him of my message, stated he may need a medication that typically isn't so costly. Patient verbalized understanding, appreciation.

## 2020-10-08 ENCOUNTER — Ambulatory Visit: Payer: Medicare Other | Admitting: Thoracic Surgery (Cardiothoracic Vascular Surgery)

## 2020-10-09 ENCOUNTER — Ambulatory Visit (INDEPENDENT_AMBULATORY_CARE_PROVIDER_SITE_OTHER): Payer: Medicare Other

## 2020-10-09 DIAGNOSIS — I442 Atrioventricular block, complete: Secondary | ICD-10-CM | POA: Diagnosis not present

## 2020-10-15 LAB — CUP PACEART REMOTE DEVICE CHECK
Battery Remaining Longevity: 140 mo
Battery Voltage: 3.05 V
Brady Statistic AP VP Percent: 32.72 %
Brady Statistic AP VS Percent: 0 %
Brady Statistic AS VP Percent: 67.18 %
Brady Statistic AS VS Percent: 0.11 %
Brady Statistic RA Percent Paced: 32.72 %
Brady Statistic RV Percent Paced: 99.89 %
Date Time Interrogation Session: 20220820224200
Implantable Lead Implant Date: 20210816
Implantable Lead Implant Date: 20210816
Implantable Lead Location: 753859
Implantable Lead Location: 753860
Implantable Lead Model: 3830
Implantable Lead Model: 5076
Implantable Pulse Generator Implant Date: 20210816
Lead Channel Impedance Value: 361 Ohm
Lead Channel Impedance Value: 361 Ohm
Lead Channel Impedance Value: 513 Ohm
Lead Channel Impedance Value: 532 Ohm
Lead Channel Pacing Threshold Amplitude: 0.75 V
Lead Channel Pacing Threshold Amplitude: 0.875 V
Lead Channel Pacing Threshold Pulse Width: 0.4 ms
Lead Channel Pacing Threshold Pulse Width: 0.4 ms
Lead Channel Sensing Intrinsic Amplitude: 31.625 mV
Lead Channel Sensing Intrinsic Amplitude: 31.625 mV
Lead Channel Sensing Intrinsic Amplitude: 5.75 mV
Lead Channel Sensing Intrinsic Amplitude: 5.75 mV
Lead Channel Setting Pacing Amplitude: 1.5 V
Lead Channel Setting Pacing Amplitude: 2 V
Lead Channel Setting Pacing Pulse Width: 0.4 ms
Lead Channel Setting Sensing Sensitivity: 0.9 mV

## 2020-10-28 NOTE — Progress Notes (Signed)
Remote pacemaker transmission.   

## 2020-11-26 ENCOUNTER — Ambulatory Visit (HOSPITAL_COMMUNITY): Payer: Medicare Other | Attending: Cardiovascular Disease

## 2020-11-26 ENCOUNTER — Other Ambulatory Visit: Payer: Self-pay

## 2020-11-26 DIAGNOSIS — Z953 Presence of xenogenic heart valve: Secondary | ICD-10-CM

## 2020-11-26 LAB — ECHOCARDIOGRAM COMPLETE
AV Mean grad: 6.8 mmHg
AV Peak grad: 12 mmHg
Ao pk vel: 1.74 m/s
Area-P 1/2: 2.89 cm2
S' Lateral: 2.8 cm

## 2021-01-08 ENCOUNTER — Ambulatory Visit (INDEPENDENT_AMBULATORY_CARE_PROVIDER_SITE_OTHER): Payer: Medicare Other

## 2021-01-08 DIAGNOSIS — I442 Atrioventricular block, complete: Secondary | ICD-10-CM | POA: Diagnosis not present

## 2021-01-08 LAB — CUP PACEART REMOTE DEVICE CHECK
Battery Remaining Longevity: 137 mo
Battery Voltage: 3.04 V
Brady Statistic AP VP Percent: 39.63 %
Brady Statistic AP VS Percent: 0.03 %
Brady Statistic AS VP Percent: 59.45 %
Brady Statistic AS VS Percent: 0.88 %
Brady Statistic RA Percent Paced: 40.08 %
Brady Statistic RV Percent Paced: 99.09 %
Date Time Interrogation Session: 20221115053520
Implantable Lead Implant Date: 20210816
Implantable Lead Implant Date: 20210816
Implantable Lead Location: 753859
Implantable Lead Location: 753860
Implantable Lead Model: 3830
Implantable Lead Model: 5076
Implantable Pulse Generator Implant Date: 20210816
Lead Channel Impedance Value: 361 Ohm
Lead Channel Impedance Value: 380 Ohm
Lead Channel Impedance Value: 532 Ohm
Lead Channel Impedance Value: 551 Ohm
Lead Channel Pacing Threshold Amplitude: 0.5 V
Lead Channel Pacing Threshold Amplitude: 0.875 V
Lead Channel Pacing Threshold Pulse Width: 0.4 ms
Lead Channel Pacing Threshold Pulse Width: 0.4 ms
Lead Channel Sensing Intrinsic Amplitude: 26.5 mV
Lead Channel Sensing Intrinsic Amplitude: 26.5 mV
Lead Channel Sensing Intrinsic Amplitude: 7.125 mV
Lead Channel Sensing Intrinsic Amplitude: 7.125 mV
Lead Channel Setting Pacing Amplitude: 1.5 V
Lead Channel Setting Pacing Amplitude: 2 V
Lead Channel Setting Pacing Pulse Width: 0.4 ms
Lead Channel Setting Sensing Sensitivity: 0.9 mV

## 2021-01-09 ENCOUNTER — Ambulatory Visit: Payer: Medicare Other | Admitting: Neurology

## 2021-01-16 NOTE — Progress Notes (Signed)
Remote pacemaker transmission.   

## 2021-04-09 ENCOUNTER — Ambulatory Visit (INDEPENDENT_AMBULATORY_CARE_PROVIDER_SITE_OTHER): Payer: Medicare Other

## 2021-04-09 DIAGNOSIS — I442 Atrioventricular block, complete: Secondary | ICD-10-CM | POA: Diagnosis not present

## 2021-04-09 LAB — CUP PACEART REMOTE DEVICE CHECK
Battery Remaining Longevity: 135 mo
Battery Voltage: 3.03 V
Brady Statistic AP VP Percent: 23.97 %
Brady Statistic AP VS Percent: 0.02 %
Brady Statistic AS VP Percent: 75.13 %
Brady Statistic AS VS Percent: 0.88 %
Brady Statistic RA Percent Paced: 24.53 %
Brady Statistic RV Percent Paced: 99.1 %
Date Time Interrogation Session: 20230214032540
Implantable Lead Implant Date: 20210816
Implantable Lead Implant Date: 20210816
Implantable Lead Location: 753859
Implantable Lead Location: 753860
Implantable Lead Model: 3830
Implantable Lead Model: 5076
Implantable Pulse Generator Implant Date: 20210816
Lead Channel Impedance Value: 361 Ohm
Lead Channel Impedance Value: 380 Ohm
Lead Channel Impedance Value: 551 Ohm
Lead Channel Impedance Value: 551 Ohm
Lead Channel Pacing Threshold Amplitude: 0.625 V
Lead Channel Pacing Threshold Amplitude: 1 V
Lead Channel Pacing Threshold Pulse Width: 0.4 ms
Lead Channel Pacing Threshold Pulse Width: 0.4 ms
Lead Channel Sensing Intrinsic Amplitude: 10.625 mV
Lead Channel Sensing Intrinsic Amplitude: 10.625 mV
Lead Channel Sensing Intrinsic Amplitude: 7 mV
Lead Channel Sensing Intrinsic Amplitude: 7 mV
Lead Channel Setting Pacing Amplitude: 1.5 V
Lead Channel Setting Pacing Amplitude: 2 V
Lead Channel Setting Pacing Pulse Width: 0.4 ms
Lead Channel Setting Sensing Sensitivity: 0.9 mV

## 2021-04-15 NOTE — Progress Notes (Signed)
Remote pacemaker transmission.   

## 2021-07-09 ENCOUNTER — Ambulatory Visit (INDEPENDENT_AMBULATORY_CARE_PROVIDER_SITE_OTHER): Payer: Medicare Other

## 2021-07-09 DIAGNOSIS — I442 Atrioventricular block, complete: Secondary | ICD-10-CM

## 2021-07-09 LAB — CUP PACEART REMOTE DEVICE CHECK
Battery Remaining Longevity: 132 mo
Battery Voltage: 3.03 V
Brady Statistic AP VP Percent: 24.39 %
Brady Statistic AP VS Percent: 0.01 %
Brady Statistic AS VP Percent: 75.16 %
Brady Statistic AS VS Percent: 0.44 %
Brady Statistic RA Percent Paced: 24.67 %
Brady Statistic RV Percent Paced: 99.55 %
Date Time Interrogation Session: 20230516043743
Implantable Lead Implant Date: 20210816
Implantable Lead Implant Date: 20210816
Implantable Lead Location: 753859
Implantable Lead Location: 753860
Implantable Lead Model: 3830
Implantable Lead Model: 5076
Implantable Pulse Generator Implant Date: 20210816
Lead Channel Impedance Value: 361 Ohm
Lead Channel Impedance Value: 380 Ohm
Lead Channel Impedance Value: 532 Ohm
Lead Channel Impedance Value: 551 Ohm
Lead Channel Pacing Threshold Amplitude: 0.625 V
Lead Channel Pacing Threshold Amplitude: 0.875 V
Lead Channel Pacing Threshold Pulse Width: 0.4 ms
Lead Channel Pacing Threshold Pulse Width: 0.4 ms
Lead Channel Sensing Intrinsic Amplitude: 10.625 mV
Lead Channel Sensing Intrinsic Amplitude: 10.625 mV
Lead Channel Sensing Intrinsic Amplitude: 6.375 mV
Lead Channel Sensing Intrinsic Amplitude: 6.375 mV
Lead Channel Setting Pacing Amplitude: 1.5 V
Lead Channel Setting Pacing Amplitude: 2 V
Lead Channel Setting Pacing Pulse Width: 0.4 ms
Lead Channel Setting Sensing Sensitivity: 0.9 mV

## 2021-07-25 NOTE — Progress Notes (Signed)
Remote pacemaker transmission.   

## 2021-10-08 ENCOUNTER — Ambulatory Visit (INDEPENDENT_AMBULATORY_CARE_PROVIDER_SITE_OTHER): Payer: Medicare Other

## 2021-10-08 DIAGNOSIS — I442 Atrioventricular block, complete: Secondary | ICD-10-CM

## 2021-10-08 LAB — CUP PACEART REMOTE DEVICE CHECK
Battery Remaining Longevity: 129 mo
Battery Voltage: 3.02 V
Brady Statistic AP VP Percent: 28.6 %
Brady Statistic AP VS Percent: 0 %
Brady Statistic AS VP Percent: 71.23 %
Brady Statistic AS VS Percent: 0.17 %
Brady Statistic RA Percent Paced: 28.68 %
Brady Statistic RV Percent Paced: 99.83 %
Date Time Interrogation Session: 20230814195603
Implantable Lead Implant Date: 20210816
Implantable Lead Implant Date: 20210816
Implantable Lead Location: 753859
Implantable Lead Location: 753860
Implantable Lead Model: 3830
Implantable Lead Model: 5076
Implantable Pulse Generator Implant Date: 20210816
Lead Channel Impedance Value: 380 Ohm
Lead Channel Impedance Value: 399 Ohm
Lead Channel Impedance Value: 551 Ohm
Lead Channel Impedance Value: 570 Ohm
Lead Channel Pacing Threshold Amplitude: 0.625 V
Lead Channel Pacing Threshold Amplitude: 0.875 V
Lead Channel Pacing Threshold Pulse Width: 0.4 ms
Lead Channel Pacing Threshold Pulse Width: 0.4 ms
Lead Channel Sensing Intrinsic Amplitude: 10.625 mV
Lead Channel Sensing Intrinsic Amplitude: 10.625 mV
Lead Channel Sensing Intrinsic Amplitude: 6.75 mV
Lead Channel Sensing Intrinsic Amplitude: 6.75 mV
Lead Channel Setting Pacing Amplitude: 1.5 V
Lead Channel Setting Pacing Amplitude: 2 V
Lead Channel Setting Pacing Pulse Width: 0.4 ms
Lead Channel Setting Sensing Sensitivity: 0.9 mV

## 2021-11-08 NOTE — Progress Notes (Signed)
Remote pacemaker transmission.   

## 2022-01-07 ENCOUNTER — Ambulatory Visit (INDEPENDENT_AMBULATORY_CARE_PROVIDER_SITE_OTHER): Payer: Medicare Other

## 2022-01-07 DIAGNOSIS — I442 Atrioventricular block, complete: Secondary | ICD-10-CM

## 2022-01-07 LAB — CUP PACEART REMOTE DEVICE CHECK
Battery Remaining Longevity: 127 mo
Battery Voltage: 3.02 V
Brady Statistic AP VP Percent: 25.61 %
Brady Statistic AP VS Percent: 0.01 %
Brady Statistic AS VP Percent: 73.86 %
Brady Statistic AS VS Percent: 0.52 %
Brady Statistic RA Percent Paced: 25.92 %
Brady Statistic RV Percent Paced: 99.47 %
Date Time Interrogation Session: 20231114004547
Implantable Lead Connection Status: 753985
Implantable Lead Connection Status: 753985
Implantable Lead Implant Date: 20210816
Implantable Lead Implant Date: 20210816
Implantable Lead Location: 753859
Implantable Lead Location: 753860
Implantable Lead Model: 3830
Implantable Lead Model: 5076
Implantable Pulse Generator Implant Date: 20210816
Lead Channel Impedance Value: 380 Ohm
Lead Channel Impedance Value: 399 Ohm
Lead Channel Impedance Value: 570 Ohm
Lead Channel Impedance Value: 570 Ohm
Lead Channel Pacing Threshold Amplitude: 0.625 V
Lead Channel Pacing Threshold Amplitude: 0.875 V
Lead Channel Pacing Threshold Pulse Width: 0.4 ms
Lead Channel Pacing Threshold Pulse Width: 0.4 ms
Lead Channel Sensing Intrinsic Amplitude: 10.625 mV
Lead Channel Sensing Intrinsic Amplitude: 10.625 mV
Lead Channel Sensing Intrinsic Amplitude: 6.375 mV
Lead Channel Sensing Intrinsic Amplitude: 6.375 mV
Lead Channel Setting Pacing Amplitude: 1.5 V
Lead Channel Setting Pacing Amplitude: 2 V
Lead Channel Setting Pacing Pulse Width: 0.4 ms
Lead Channel Setting Sensing Sensitivity: 0.9 mV
Zone Setting Status: 755011

## 2022-02-05 NOTE — Progress Notes (Signed)
Remote pacemaker transmission.   

## 2022-04-08 ENCOUNTER — Ambulatory Visit: Payer: Medicare Other

## 2022-04-08 DIAGNOSIS — I442 Atrioventricular block, complete: Secondary | ICD-10-CM

## 2022-04-08 LAB — CUP PACEART REMOTE DEVICE CHECK
Battery Remaining Longevity: 122 mo
Battery Voltage: 3.02 V
Brady Statistic AP VP Percent: 39.14 %
Brady Statistic AP VS Percent: 0.01 %
Brady Statistic AS VP Percent: 60.39 %
Brady Statistic AS VS Percent: 0.46 %
Brady Statistic RA Percent Paced: 39.4 %
Brady Statistic RV Percent Paced: 99.53 %
Date Time Interrogation Session: 20240213023750
Implantable Lead Connection Status: 753985
Implantable Lead Connection Status: 753985
Implantable Lead Implant Date: 20210816
Implantable Lead Implant Date: 20210816
Implantable Lead Location: 753859
Implantable Lead Location: 753860
Implantable Lead Model: 3830
Implantable Lead Model: 5076
Implantable Pulse Generator Implant Date: 20210816
Lead Channel Impedance Value: 361 Ohm
Lead Channel Impedance Value: 380 Ohm
Lead Channel Impedance Value: 494 Ohm
Lead Channel Impedance Value: 532 Ohm
Lead Channel Pacing Threshold Amplitude: 0.75 V
Lead Channel Pacing Threshold Amplitude: 0.875 V
Lead Channel Pacing Threshold Pulse Width: 0.4 ms
Lead Channel Pacing Threshold Pulse Width: 0.4 ms
Lead Channel Sensing Intrinsic Amplitude: 10.625 mV
Lead Channel Sensing Intrinsic Amplitude: 10.625 mV
Lead Channel Sensing Intrinsic Amplitude: 6.75 mV
Lead Channel Sensing Intrinsic Amplitude: 6.75 mV
Lead Channel Setting Pacing Amplitude: 1.5 V
Lead Channel Setting Pacing Amplitude: 2 V
Lead Channel Setting Pacing Pulse Width: 0.4 ms
Lead Channel Setting Sensing Sensitivity: 0.9 mV
Zone Setting Status: 755011

## 2022-04-14 ENCOUNTER — Other Ambulatory Visit: Payer: Self-pay

## 2022-04-14 MED ORDER — ATORVASTATIN CALCIUM 40 MG PO TABS: 40.0000 mg | ORAL_TABLET | Freq: Every day | ORAL | 0 refills | Status: AC

## 2022-04-14 MED ORDER — ATORVASTATIN CALCIUM 40 MG PO TABS: 40.0000 mg | ORAL_TABLET | Freq: Every day | ORAL | 1 refills | Status: DC

## 2022-05-12 NOTE — Progress Notes (Signed)
Remote pacemaker transmission.   

## 2022-07-08 ENCOUNTER — Ambulatory Visit (INDEPENDENT_AMBULATORY_CARE_PROVIDER_SITE_OTHER): Payer: Medicare Other

## 2022-07-08 DIAGNOSIS — I442 Atrioventricular block, complete: Secondary | ICD-10-CM | POA: Diagnosis not present

## 2022-07-08 LAB — CUP PACEART REMOTE DEVICE CHECK
Battery Remaining Longevity: 120 mo
Battery Voltage: 3.02 V
Brady Statistic AP VP Percent: 37.25 %
Brady Statistic AP VS Percent: 0.01 %
Brady Statistic AS VP Percent: 62.47 %
Brady Statistic AS VS Percent: 0.27 %
Brady Statistic RA Percent Paced: 37.37 %
Brady Statistic RV Percent Paced: 99.72 %
Date Time Interrogation Session: 20240514033338
Implantable Lead Connection Status: 753985
Implantable Lead Connection Status: 753985
Implantable Lead Implant Date: 20210816
Implantable Lead Implant Date: 20210816
Implantable Lead Location: 753859
Implantable Lead Location: 753860
Implantable Lead Model: 3830
Implantable Lead Model: 5076
Implantable Pulse Generator Implant Date: 20210816
Lead Channel Impedance Value: 380 Ohm
Lead Channel Impedance Value: 380 Ohm
Lead Channel Impedance Value: 551 Ohm
Lead Channel Impedance Value: 589 Ohm
Lead Channel Pacing Threshold Amplitude: 0.75 V
Lead Channel Pacing Threshold Amplitude: 0.875 V
Lead Channel Pacing Threshold Pulse Width: 0.4 ms
Lead Channel Pacing Threshold Pulse Width: 0.4 ms
Lead Channel Sensing Intrinsic Amplitude: 10.625 mV
Lead Channel Sensing Intrinsic Amplitude: 10.625 mV
Lead Channel Sensing Intrinsic Amplitude: 6.5 mV
Lead Channel Sensing Intrinsic Amplitude: 6.5 mV
Lead Channel Setting Pacing Amplitude: 1.5 V
Lead Channel Setting Pacing Amplitude: 2 V
Lead Channel Setting Pacing Pulse Width: 0.4 ms
Lead Channel Setting Sensing Sensitivity: 0.9 mV
Zone Setting Status: 755011

## 2022-08-04 NOTE — Progress Notes (Signed)
Remote pacemaker transmission.   

## 2022-10-07 ENCOUNTER — Ambulatory Visit (INDEPENDENT_AMBULATORY_CARE_PROVIDER_SITE_OTHER): Payer: Medicare Other

## 2022-10-07 DIAGNOSIS — I442 Atrioventricular block, complete: Secondary | ICD-10-CM | POA: Diagnosis not present

## 2022-10-07 LAB — CUP PACEART REMOTE DEVICE CHECK
Battery Remaining Longevity: 117 mo
Battery Voltage: 3.01 V
Brady Statistic AP VP Percent: 44.32 %
Brady Statistic AP VS Percent: 0.02 %
Brady Statistic AS VP Percent: 55.39 %
Brady Statistic AS VS Percent: 0.27 %
Brady Statistic RA Percent Paced: 44.45 %
Brady Statistic RV Percent Paced: 99.71 %
Date Time Interrogation Session: 20240813015845
Implantable Lead Connection Status: 753985
Implantable Lead Connection Status: 753985
Implantable Lead Implant Date: 20210816
Implantable Lead Implant Date: 20210816
Implantable Lead Location: 753859
Implantable Lead Location: 753860
Implantable Lead Model: 3830
Implantable Lead Model: 5076
Implantable Pulse Generator Implant Date: 20210816
Lead Channel Impedance Value: 361 Ohm
Lead Channel Impedance Value: 399 Ohm
Lead Channel Impedance Value: 551 Ohm
Lead Channel Impedance Value: 551 Ohm
Lead Channel Pacing Threshold Amplitude: 0.625 V
Lead Channel Pacing Threshold Amplitude: 0.75 V
Lead Channel Pacing Threshold Pulse Width: 0.4 ms
Lead Channel Pacing Threshold Pulse Width: 0.4 ms
Lead Channel Sensing Intrinsic Amplitude: 10.625 mV
Lead Channel Sensing Intrinsic Amplitude: 10.625 mV
Lead Channel Sensing Intrinsic Amplitude: 6.125 mV
Lead Channel Sensing Intrinsic Amplitude: 6.125 mV
Lead Channel Setting Pacing Amplitude: 1.5 V
Lead Channel Setting Pacing Amplitude: 2 V
Lead Channel Setting Pacing Pulse Width: 0.4 ms
Lead Channel Setting Sensing Sensitivity: 0.9 mV
Zone Setting Status: 755011

## 2022-10-22 NOTE — Progress Notes (Signed)
Remote pacemaker transmission.   

## 2023-01-06 ENCOUNTER — Ambulatory Visit (INDEPENDENT_AMBULATORY_CARE_PROVIDER_SITE_OTHER): Payer: Medicare Other

## 2023-01-06 DIAGNOSIS — I442 Atrioventricular block, complete: Secondary | ICD-10-CM | POA: Diagnosis not present

## 2023-01-08 LAB — CUP PACEART REMOTE DEVICE CHECK
Battery Remaining Longevity: 114 mo
Battery Voltage: 3.01 V
Brady Statistic AP VP Percent: 48.45 %
Brady Statistic AP VS Percent: 0.02 %
Brady Statistic AS VP Percent: 50.55 %
Brady Statistic AS VS Percent: 0.97 %
Brady Statistic RA Percent Paced: 49.01 %
Brady Statistic RV Percent Paced: 99.01 %
Date Time Interrogation Session: 20241112050925
Implantable Lead Connection Status: 753985
Implantable Lead Connection Status: 753985
Implantable Lead Implant Date: 20210816
Implantable Lead Implant Date: 20210816
Implantable Lead Location: 753859
Implantable Lead Location: 753860
Implantable Lead Model: 3830
Implantable Lead Model: 5076
Implantable Pulse Generator Implant Date: 20210816
Lead Channel Impedance Value: 361 Ohm
Lead Channel Impedance Value: 399 Ohm
Lead Channel Impedance Value: 513 Ohm
Lead Channel Impedance Value: 570 Ohm
Lead Channel Pacing Threshold Amplitude: 0.625 V
Lead Channel Pacing Threshold Amplitude: 0.75 V
Lead Channel Pacing Threshold Pulse Width: 0.4 ms
Lead Channel Pacing Threshold Pulse Width: 0.4 ms
Lead Channel Sensing Intrinsic Amplitude: 10.625 mV
Lead Channel Sensing Intrinsic Amplitude: 10.625 mV
Lead Channel Sensing Intrinsic Amplitude: 6.875 mV
Lead Channel Sensing Intrinsic Amplitude: 6.875 mV
Lead Channel Setting Pacing Amplitude: 1.5 V
Lead Channel Setting Pacing Amplitude: 2 V
Lead Channel Setting Pacing Pulse Width: 0.4 ms
Lead Channel Setting Sensing Sensitivity: 0.9 mV
Zone Setting Status: 755011

## 2023-02-03 NOTE — Progress Notes (Signed)
Remote pacemaker transmission.   

## 2023-04-07 ENCOUNTER — Ambulatory Visit (INDEPENDENT_AMBULATORY_CARE_PROVIDER_SITE_OTHER): Payer: Medicare Other

## 2023-04-07 DIAGNOSIS — I442 Atrioventricular block, complete: Secondary | ICD-10-CM

## 2023-04-07 LAB — CUP PACEART REMOTE DEVICE CHECK
Battery Remaining Longevity: 110 mo
Battery Voltage: 3.01 V
Brady Statistic AP VP Percent: 30.63 %
Brady Statistic AP VS Percent: 0.04 %
Brady Statistic AS VP Percent: 68.58 %
Brady Statistic AS VS Percent: 0.75 %
Brady Statistic RA Percent Paced: 31.09 %
Brady Statistic RV Percent Paced: 99.21 %
Date Time Interrogation Session: 20250211014018
Implantable Lead Connection Status: 753985
Implantable Lead Connection Status: 753985
Implantable Lead Implant Date: 20210816
Implantable Lead Implant Date: 20210816
Implantable Lead Location: 753859
Implantable Lead Location: 753860
Implantable Lead Model: 3830
Implantable Lead Model: 5076
Implantable Pulse Generator Implant Date: 20210816
Lead Channel Impedance Value: 361 Ohm
Lead Channel Impedance Value: 380 Ohm
Lead Channel Impedance Value: 494 Ohm
Lead Channel Impedance Value: 551 Ohm
Lead Channel Pacing Threshold Amplitude: 0.625 V
Lead Channel Pacing Threshold Amplitude: 0.75 V
Lead Channel Pacing Threshold Pulse Width: 0.4 ms
Lead Channel Pacing Threshold Pulse Width: 0.4 ms
Lead Channel Sensing Intrinsic Amplitude: 31.625 mV
Lead Channel Sensing Intrinsic Amplitude: 31.625 mV
Lead Channel Sensing Intrinsic Amplitude: 6.875 mV
Lead Channel Sensing Intrinsic Amplitude: 6.875 mV
Lead Channel Setting Pacing Amplitude: 1.5 V
Lead Channel Setting Pacing Amplitude: 2 V
Lead Channel Setting Pacing Pulse Width: 0.4 ms
Lead Channel Setting Sensing Sensitivity: 0.9 mV
Zone Setting Status: 755011

## 2023-04-09 ENCOUNTER — Encounter: Payer: Self-pay | Admitting: Cardiology

## 2023-05-14 NOTE — Progress Notes (Signed)
 Remote pacemaker transmission.

## 2023-06-02 ENCOUNTER — Emergency Department (HOSPITAL_COMMUNITY)
Admission: EM | Admit: 2023-06-02 | Discharge: 2023-06-03 | Disposition: A | Discharge status: Home / Self Care | Attending: Emergency Medicine | Admitting: Emergency Medicine

## 2023-06-02 ENCOUNTER — Emergency Department (HOSPITAL_COMMUNITY)

## 2023-06-02 ENCOUNTER — Other Ambulatory Visit: Payer: Self-pay

## 2023-06-02 ENCOUNTER — Encounter (HOSPITAL_COMMUNITY): Payer: Self-pay

## 2023-06-02 DIAGNOSIS — R200 Anesthesia of skin: Secondary | ICD-10-CM | POA: Diagnosis present

## 2023-06-02 DIAGNOSIS — I2510 Atherosclerotic heart disease of native coronary artery without angina pectoris: Secondary | ICD-10-CM | POA: Diagnosis not present

## 2023-06-02 DIAGNOSIS — Z8546 Personal history of malignant neoplasm of prostate: Secondary | ICD-10-CM | POA: Insufficient documentation

## 2023-06-02 DIAGNOSIS — Z79899 Other long term (current) drug therapy: Secondary | ICD-10-CM | POA: Diagnosis not present

## 2023-06-02 DIAGNOSIS — R519 Headache, unspecified: Secondary | ICD-10-CM | POA: Diagnosis not present

## 2023-06-02 DIAGNOSIS — Z87891 Personal history of nicotine dependence: Secondary | ICD-10-CM | POA: Diagnosis not present

## 2023-06-02 DIAGNOSIS — Z8673 Personal history of transient ischemic attack (TIA), and cerebral infarction without residual deficits: Secondary | ICD-10-CM | POA: Insufficient documentation

## 2023-06-02 DIAGNOSIS — I110 Hypertensive heart disease with heart failure: Secondary | ICD-10-CM | POA: Diagnosis not present

## 2023-06-02 DIAGNOSIS — I509 Heart failure, unspecified: Secondary | ICD-10-CM | POA: Insufficient documentation

## 2023-06-02 LAB — BASIC METABOLIC PANEL WITH GFR
Anion gap: 8 (ref 5–15)
BUN: 11 mg/dL (ref 8–23)
CO2: 26 mmol/L (ref 22–32)
Calcium: 9.1 mg/dL (ref 8.9–10.3)
Chloride: 105 mmol/L (ref 98–111)
Creatinine, Ser: 1.03 mg/dL (ref 0.61–1.24)
GFR, Estimated: >60 mL/min (ref >60)
Glucose, Bld: 102 mg/dL — ABNORMAL HIGH (ref 70–99)
Potassium: 4 mmol/L (ref 3.5–5.1)
Sodium: 139 mmol/L (ref 135–145)

## 2023-06-02 LAB — CBC WITH DIFFERENTIAL/PLATELET
Abs Immature Granulocytes: 0.01 10*3/uL (ref 0.00–0.07)
Basophils Absolute: 0 10*3/uL (ref 0.0–0.1)
Basophils Relative: 1 %
Eosinophils Absolute: 0.1 10*3/uL (ref 0.0–0.5)
Eosinophils Relative: 2 %
HCT: 43.1 % (ref 39.0–52.0)
Hemoglobin: 14.4 g/dL (ref 13.0–17.0)
Immature Granulocytes: 0 %
Lymphocytes Relative: 27 %
Lymphs Abs: 1.5 10*3/uL (ref 0.7–4.0)
MCH: 30.2 pg (ref 26.0–34.0)
MCHC: 33.4 g/dL (ref 30.0–36.0)
MCV: 90.4 fL (ref 80.0–100.0)
Monocytes Absolute: 0.7 10*3/uL (ref 0.1–1.0)
Monocytes Relative: 12 %
Neutro Abs: 3.2 10*3/uL (ref 1.7–7.7)
Neutrophils Relative %: 58 %
Platelets: 149 10*3/uL — ABNORMAL LOW (ref 150–400)
RBC: 4.77 MIL/uL (ref 4.22–5.81)
RDW: 13 % (ref 11.5–15.5)
WBC: 5.5 10*3/uL (ref 4.0–10.5)
nRBC: 0 % (ref 0.0–0.2)

## 2023-06-02 LAB — PROTIME-INR
INR: 1.1 (ref 0.8–1.2)
Prothrombin Time: 14.3 s (ref 11.4–15.2)

## 2023-06-02 MED ORDER — FAMOTIDINE 40 MG PO TABS
40.0000 mg | ORAL_TABLET | Freq: Every day | ORAL | 0 refills | Status: DC
Start: 2023-06-02 — End: 2023-07-28

## 2023-06-02 MED ORDER — IOHEXOL 350 MG/ML SOLN
75.0000 mL | Freq: Once | INTRAVENOUS | Status: AC | PRN
  Administered 2023-06-02: 75 mL via INTRAVENOUS

## 2023-06-02 MED ORDER — LABETALOL HCL 5 MG/ML IV SOLN: 10.0000 mg | Freq: Once | INTRAVENOUS | Status: DC

## 2023-06-02 NOTE — Discharge Instructions (Addendum)
 While you were in the emergency room, you had a CT scan done of your head and neck.  This test did not show a new stroke.  Like we discussed, with your symptoms occurring 1 week ago, if there was a small stroke, there is not much that we are able to do at this time.  In the meantime, I would like you to begin taking 81 mg of aspirin each day.  While you take this medicine, I have also sent you a medication called famotidine that you can take which will help protect your stomach.  I have provided referral to a neurology group.  They will contact you to set up an appointment.  Please follow-up with your primary care doctor.

## 2023-06-02 NOTE — ED Provider Notes (Signed)
 [place] EMERGENCY DEPARTMENT AT [facility] Provider Note  CSN: [id number] Arrival date & time: 06/02/23 1751  Chief Complaint(s) Headache  HPI Alan [name] is a 73 y.o. male who is here today because "I had a stroke."  Patient reports that 1 week ago he had an episode where he developed right arm weakness.  He says that he was holding a dish of food to put out for his cat, and he dropped it.  He then had numbness in that hand, and felt as though he was in a difficult time with his speech.  Patient tells me that he mentioned this to his wife, who he felt downplayed his symptoms, but he has had a headache since.  Patient has felt as though he had a difficult time producing speech since.  He has not had any falls, difficulty with his balance, or currently feel any numbness or weakness.  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   Enterococcal bacteremia 10/19/2014   Heart murmur    Hepatitis    C   Hypertension    Memory difficulty    Migraine 09/12/2015   Mini stroke    x22   Mitral valve regurgitation, 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/2014   Paroxysmal atrial fibrillation (HCC) 10/19/2014   Paroxysmal atrial flutter (HCC) 10/19/2014   Prostate cancer (HCC) dx'd 2015   surg only per pt   Protein-calorie malnutrition, severe (HCC)    Restless leg syndrome    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/2014   23 mm [facility] Ease bovine pericardial bioprosthetic  tissue valve with bovine pericardial patch enlargement of aortic root   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   LIMA to LAD   S/P Maze operation for atrial fibrillation 10/05/2019   Complete bilateral atrial lesion set using cryothermy and bipolar radiofrequency ablation   S/P mitral valve repair 11/01/2014   Debridement of vegetation on anterior leaflet   S/P mitral valve replacement with bioprosthetic valve 10/05/2019   29 mm Medtronic Mosaic stented porcine bioprosthetic tissue valve   S/P tricuspid valve repair 10/05/2019   28 mm Edwards mc3 ring annuloplasty   Septic embolism (HCC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   Tricuspid regurgitation    Patient Active Problem List   Diagnosis Date Noted   CHB (complete heart block) (HCC) 01/09/2020   Pacemaker 10/27/2019   S/P redo mitral valve replacement with bioprosthetic valve 10/05/2019   S/P tricuspid valve repair 10/05/2019   S/P Maze operation for atrial fibrillation 10/05/2019   S/P MVR (mitral valve replacement) 10/05/2019   Tricuspid regurgitation    Chest pain 07/25/2019   Dyspnea 05/10/2019   Acute on chronic diastolic heart failure (HCC) 04/13/2018   Status post ligation of left atrial appendage 03/20/2018   H/O mitral valve repair 03/20/2018   History of aortic valve replacement with bioprosthetic valve 03/20/2018   Recurrent major depressive disorder (  HCC) 03/20/2018   MCI (mild cognitive impairment) 02/06/2017   Migraine 09/12/2015   Vertigo 09/12/2015   Atrial flutter (HCC)    Typical atrial flutter (HCC)    Anticoagulated 11/17/2014   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/2014   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   History of endocarditis 2016    CAD S/P percutaneous coronary angioplasty 10/23/2014   Atherosclerotic peripheral vascular disease (HCC) 10/20/2014   History of stroke 10/20/2014   Severe mitral regurgitation    Paroxysmal atrial  fibrillation (HCC) 10/19/2014   Atrial flutter with rapid ventricular response (HCC) s/p TEE/DCCV 07/28/19 successful 10/19/2014   Prostate cancer (HCC) 07/23/2011   HTN (hypertension) 08/16/2008   Depression with anxiety 06/29/2008   Restless legs syndrome (RLS) 06/21/2007   Allergic rhinitis 06/21/2007   Attention deficit disorder 06/21/2007   Home Medication(s) Prior to Admission medications   Medication Sig Start Date End Date Taking? Authorizing Provider  famotidine (PEPCID) 40 MG tablet Take 1 tablet (40 mg total) by mouth daily. 06/02/23  Yes Anders Simmonds T, DO  acetaminophen (TYLENOL) 325 MG tablet Take 2 tablets (650 mg total) by mouth every 4 (four) hours as needed for headache or mild pain. 07/28/19   Leone Brand, NP  albuterol (VENTOLIN HFA) 108 (90 Base) MCG/ACT inhaler Inhale 2 puffs into the lungs every 6 (six) hours as needed for wheezing or shortness of breath.     [provider]  amoxicillin (AMOXIL) 500 MG capsule Take four capsules 30 - 60 minutes before dental appointment. Patient taking differently: Take 2,000 mg by mouth See admin instructions. Take four capsules (2000 mg) by mouth 30 - 60 minutes before dental appointment. 09/22/19   Charlynne Pander, DDS  atorvastatin (LIPITOR) 40 MG tablet Take 1 tablet (40 mg total) by mouth daily. 04/14/22 07/13/22  Croitoru, Mihai, MD  BIOTIN PO Take 1 tablet by mouth daily.    [provider]  calcium-vitamin D (OSCAL WITH D) 500-200 MG-UNIT tablet Take 1 tablet by mouth daily.    [provider]  diphenhydrAMINE HCl, Sleep, (UNISOM SLEEPMELTS PO) Take 1 tablet by mouth at bedtime.    [provider]  doxylamine, Sleep, (UNISOM) 25 MG tablet Take 12.5 mg by mouth at bedtime.    [provider]  Erenumab-aooe (AIMOVIG) 140 MG/ML SOAJ Inject 140 mg into the skin every 30 (thirty) days. 08/29/20   York Spaniel, MD  gabapentin (NEURONTIN) 300 MG capsule Take 300 mg by mouth See admin  instructions. Take one capsule (300 mg) by mouth twice daily - with supper and at bedtime    [provider]  meclizine (ANTIVERT) 12.5 MG tablet Take 1 tablet (12.5 mg total) by mouth 2 (two) times daily as needed for dizziness. 02/19/20   Placido Sou, PA-C  Multiple Vitamins-Minerals (CENTRUM SILVER 50+MEN PO) Take 1 tablet by mouth daily.    [provider]  Multiple Vitamins-Minerals (PRESERVISION AREDS 2) CAPS Take 1 capsule by mouth daily.    [provider]  Omega-3 Fatty Acids (OMEGA-3 PO) Take 1 capsule by mouth daily.    [provider]  Probiotic Product (PROBIOTIC PO) Take 1 capsule by mouth 2 (two) times daily.     [provider]  sertraline (ZOLOFT) 50 MG tablet Take 50 mg by mouth daily.    [provider]  Turmeric 500 MG CAPS Take 500 mg by mouth daily.    [provider]  Past Surgical History Past Surgical History:  Procedure Laterality Date   AORTIC ROOT ENLARGEMENT N/A 11/01/2014   Procedure: AORTIC ROOT ENLARGEMENT;  Surgeon: Purcell Nails, MD;  Location: [facility] OR;  Service: Open Heart Surgery;  Laterality: N/A;   AORTIC VALVE REPLACEMENT N/A 11/01/2014   Procedure: AORTIC VALVE REPLACEMENT (AVR);  Surgeon: Purcell Nails, MD;  Location: [facility] OR;  Service: Open Heart Surgery;  Laterality: N/A;   CARDIAC CATHETERIZATION N/A 10/23/2014   Procedure: Right/Left Heart Cath and Coronary Angiography;  Surgeon: Runell Gess, MD;  Location: [facility] INVASIVE CV LAB;  Service: Cardiovascular;  Laterality: N/A;   CARDIOVERSION N/A 12/18/2014   Procedure: CARDIOVERSION;  Surgeon: Chrystie Nose, MD;  Location: [facility] ENDOSCOPY;  Service: Cardiovascular;  Laterality: N/A;   CARDIOVERSION N/A 02/06/2015   Procedure: CARDIOVERSION;  Surgeon: Laurey Morale, MD;  Location: [facility] ENDOSCOPY;  Service:  Cardiovascular;  Laterality: N/A;   CARDIOVERSION N/A 04/07/2018   Procedure: CARDIOVERSION;  Surgeon: Thurmon Fair, MD;  Location: [facility] ENDOSCOPY;  Service: Cardiovascular;  Laterality: N/A;   CARDIOVERSION N/A 07/28/2019   Procedure: CARDIOVERSION;  Surgeon: Lewayne Bunting, MD;  Location: [facility] ENDOSCOPY;  Service: Cardiovascular;  Laterality: N/A;   COLONOSCOPY N/A 10/25/2014   Procedure: COLONOSCOPY;  Surgeon: Willis Modena, MD;  Location: [facility] ENDOSCOPY;  Service: Endoscopy;  Laterality: N/A;   CORONARY ARTERY BYPASS GRAFT N/A 11/01/2014   Procedure: CORONARY ARTERY BYPASS GRAFTING (CABG) x 1 using internal mammary artery;  Surgeon: Purcell Nails, MD;  Location: [facility] OR;  Service: Open Heart Surgery;  Laterality: N/A;   CORONARY CTO INTERVENTION N/A 04/12/2018   Procedure: CORONARY CTO INTERVENTION;  Surgeon: Tonny Bollman, MD;  Location: [facility] INVASIVE CV LAB;  Service: Cardiovascular;  Laterality: N/A;   CORONARY STENT INTERVENTION N/A 04/12/2018   Procedure: CORONARY STENT INTERVENTION;  Surgeon: Tonny Bollman, MD;  Location: [facility] INVASIVE CV LAB;  Service: Cardiovascular;  Laterality: N/A;   EYE SURGERY Bilateral    cataracts   MAZE N/A 10/05/2019   Procedure: MAZE;  Surgeon: Purcell Nails, MD;  Location: [facility] OR;  Service: Open Heart Surgery;  Laterality: N/A;   MITRAL VALVE REPAIR N/A 11/01/2014   Procedure: MITRAL VALVE REPAIR with no Ring;  Surgeon: Purcell Nails, MD;  Location: [facility] OR;  Service: Open Heart Surgery;  Laterality: N/A;   MITRAL VALVE REPLACEMENT N/A 10/05/2019   Procedure: MITRAL VALVE (MV) REPLACEMENT USING MOSAIC VALVE;  Surgeon: Purcell Nails, MD;  Location: [facility] OR;  Service: Open Heart Surgery;  Laterality: N/A;   PACEMAKER IMPLANT N/A 10/10/2019   Procedure: PACEMAKER IMPLANT;  Surgeon: Marinus Maw, MD;  Location: [facility] INVASIVE CV LAB;  Service: Cardiovascular;  Laterality: N/A;   PROSTATECTOMY     RIGHT/LEFT HEART CATH AND CORONARY ANGIOGRAPHY N/A 09/13/2019    Procedure: RIGHT/LEFT HEART CATH AND CORONARY ANGIOGRAPHY;  Surgeon: Swazil[place], Peter M, MD;  Location: [facility] INVASIVE CV LAB;  Service: Cardiovascular;  Laterality: N/A;   RIGHT/LEFT HEART CATH AND CORONARY/GRAFT ANGIOGRAPHY N/A 04/12/2018   Procedure: RIGHT/LEFT HEART CATH AND CORONARY/GRAFT ANGIOGRAPHY;  Surgeon: Tonny Bollman, MD;  Location: [facility] INVASIVE CV LAB;  Service: Cardiovascular;  Laterality: N/A;   TEE WITHOUT CARDIOVERSION N/A 10/20/2014   Procedure: TRANSESOPHAGEAL ECHOCARDIOGRAM (TEE);  Surgeon: Wendall Stade, MD;  Location: [facility] ENDOSCOPY;  Service: Cardiovascular;  Laterality: N/A;   TEE WITHOUT CARDIOVERSION N/A 11/01/2014   Procedure: TRANSESOPHAGEAL ECHOCARDIOGRAM (TEE);  Surgeon: Purcell Nails, MD;  Location: [facility] OR;  Service: Open Heart  Surgery;  Laterality: N/A;   TEE WITHOUT CARDIOVERSION N/A 04/08/2018   Procedure: TRANSESOPHAGEAL ECHOCARDIOGRAM (TEE);  Surgeon: Jake Bathe, MD;  Location: [facility] ENDOSCOPY;  Service: Cardiovascular;  Laterality: N/A;   TEE WITHOUT CARDIOVERSION N/A 07/28/2019   Procedure: TRANSESOPHAGEAL ECHOCARDIOGRAM (TEE);  Surgeon: Lewayne Bunting, MD;  Location: [facility] ENDOSCOPY;  Service: Cardiovascular;  Laterality: N/A;   TEE WITHOUT CARDIOVERSION N/A 10/05/2019   Procedure: TRANSESOPHAGEAL ECHOCARDIOGRAM (TEE);  Surgeon: Purcell Nails, MD;  Location: [facility] OR;  Service: Open Heart Surgery;  Laterality: N/A;   TONSILLECTOMY     TRICUSPID VALVE REPLACEMENT N/A 10/05/2019   Procedure: TRICUSPID VALVE REPAIR USING MC3 ANNULOPLASTY RING;  Surgeon: Purcell Nails, MD;  Location: [facility] OR;  Service: Open Heart Surgery;  Laterality: N/A;   Family History Family History  Problem Relation Age of Onset   Stroke Father     Social History Social History   Tobacco Use   Smoking status: Former   Smokeless tobacco: Never   Tobacco comments:    Smoked a pipe age 70->28  Vaping Use   Vaping status: Never Used  Substance Use Topics   Alcohol use: No     Alcohol/week: 0.0 standard drinks of alcohol    Comment: Drank from age 74->38   Drug use: No   Allergies Pollen extract  Review of Systems Review of Systems  Physical Exam Vital Signs  I have reviewed the triage vital signs BP 139/73   Pulse 63   Temp 97.8 F (36.6 C)   Resp 17   Ht 6' (1.829 m)   Wt 81.2 kg   SpO2 94%   BMI 24.28 kg/m   Physical Exam Vitals reviewed.  HENT:     Head: Normocephalic.  Eyes:     General: No visual field deficit. Cardiovascular:     Rate and Rhythm: Normal rate.  Pulmonary:     Effort: Pulmonary effort is normal.  Abdominal:     General: Bowel sounds are normal.     Palpations: Abdomen is soft.  Musculoskeletal:        General: Normal range of motion.  Skin:    General: Skin is warm.  Neurological:     Mental Status: He is alert and oriented to person, place, and time.     Cranial Nerves: No cranial nerve deficit, dysarthria or facial asymmetry.     Sensory: No sensory deficit.     Motor: No weakness.     Coordination: Coordination normal.  Psychiatric:        Mood and Affect: Mood normal.     ED Results and Treatments Labs (all labs ordered are listed, but only abnormal results are displayed) Labs Reviewed  BASIC METABOLIC PANEL WITH GFR - Abnormal; Notable for the following components:      Result Value   Glucose, Bld 102 (*)    All other components within normal limits  CBC WITH DIFFERENTIAL/PLATELET - Abnormal; Notable for the following components:   Platelets 149 (*)    All other components within normal limits  PROTIME-INR  Radiology No results found.  Pertinent labs & imaging results that were available during my care of the patient were reviewed by me and considered in my medical decision making (see MDM for details).  Medications Ordered in ED Medications  iohexol (OMNIPAQUE) 350 MG/ML  injection 75 mL (75 mLs Intravenous Contrast Given 06/02/23 2106)                                                                                                                                     Procedures Procedures  (including critical care time)  Medical Decision Making / ED Course   This patient presents to the ED for concern of period of weakness and difficulty with speech production, this involves an extensive number of treatment options, and is a complaint that carries with it a high risk of complications and morbidity.  The differential diagnosis includes CVA, complex migraines, consider seizure disorder.  MDM: Patient without any neurological deficits on my exam.  Patient has a pacemaker, is unable to get an MRI.  Given his symptoms being 1 week prior, CT and CTA should be appropriate for identifying potential future causes or prior new infarcts.  If negative for acute process, would provide the patient with outpatient neurology follow-up and start the patient on a baby aspirin.  Reassessment 11:20 PM-patient's CTA negative for large vessel occlusion, does show prior infarcts but no new infarcts. Will start the patient on a baby aspirin, have him follow-up with neurology for nonemergent outpatient MRI.  Neuroexam performed again, patient without any weakness, numbness, no neurological deficits.  Speech was normal.  Additional history obtained: -Additional history obtained from EMS -External records from outside source obtained and reviewed including: Chart review including previous notes, labs, imaging, consultation notes   Lab Tests: -I ordered, reviewed, and interpreted labs.   The pertinent results include:   Labs Reviewed  BASIC METABOLIC PANEL WITH GFR - Abnormal; Notable for the following components:      Result Value   Glucose, Bld 102 (*)    All other components within normal limits  CBC WITH DIFFERENTIAL/PLATELET - Abnormal; Notable for the following components:    Platelets 149 (*)    All other components within normal limits  PROTIME-INR      EKG sinus rhythm, occasional PVCs.  EKG Interpretation Date/Time:  Tuesday June 02 2023 18:19:48 EDT Ventricular Rate:  63 PR Interval:  197 QRS Duration:  141 QT Interval:  436 QTC Calculation: 447 R Axis:   31  Text Interpretation: Sinus rhythm Multiple ventricular premature complexes Probable left atrial enlargement IVCD, consider atypical RBBB Confirmed by Anders Simmonds [phone]) on 06/02/2023 11:24:23 PM         Imaging Studies ordered: I ordered imaging studies including CTA of the head and neck I independently visualized and interpreted imaging. I agree with the radiologist interpretation   Medicines ordered and prescription drug management: Meds ordered this encounter  Medications   DISCONTD: labetalol (NORMODYNE) injection 10 mg   iohexol (OMNIPAQUE) 350 MG/ML injection 75 mL   famotidine (PEPCID) 40 MG tablet    Sig: Take 1 tablet (40 mg total) by mouth daily.    Dispense:  30 tablet    Refill:  0    -I have reviewed the patients home medicines and have made adjustments as needed  Cardiac Monitoring: The patient was maintained on a cardiac monitor.  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   Arthritis    hands   Atrial flutter with rapid ventricular response (HCC) s/p TEE/DCCV 07/28/19 successful 10/19/2014   CHF (congestive heart failure) (HCC)    Coronary artery disease    Depression    Dyspnea     Dysrhythmia    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   Enterococcal bacteremia 10/19/2014   Heart murmur    Hepatitis    C   Hypertension    Memory difficulty    Migraine 09/12/2015   Mini stroke    x22   Mitral valve regurgitation, 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/2014   Paroxysmal atrial fibrillation (HCC) 10/19/2014   Paroxysmal atrial flutter (HCC) 10/19/2014   Prostate cancer (HCC) dx'd 2015   surg only per pt   Protein-calorie malnutrition, severe (HCC)    Restless leg syndrome    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/2014   23 mm [facility] Ease bovine pericardial bioprosthetic tissue valve with bovine pericardial patch enlargement of aortic root   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   LIMA to LAD   S/P Maze operation for atrial fibrillation 10/05/2019   Complete bilateral atrial lesion set using cryothermy and bipolar radiofrequency ablation   S/P mitral valve repair 11/01/2014   Debridement of vegetation on anterior leaflet   S/P mitral valve replacement with bioprosthetic valve 10/05/2019   29 mm Medtronic Mosaic stented porcine bioprosthetic tissue valve   S/P tricuspid valve repair 10/05/2019   28 mm Edwards mc3 ring annuloplasty   Septic embolism (HCC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  Patient is appropriate for outpatient workup.     Final Clinical Impression(s) / ED Diagnoses Final diagnoses:  Numbness and tingling in right hand     @PCDICTATION @    Anders Simmonds T, DO 06/02/23 2327

## 2023-06-02 NOTE — ED Triage Notes (Signed)
 Patient bib [facility] from PCP. He thinks he had a stroke on 05/21/2023 but did not come to the hospital . Patient has had expressive aphasia since and also a headache. Patient says that aphasia is worse today and yesterday and headache is off and on 10/10 pain.  EMS reports hypertension 210/118.  Patient is alert and oriented x4.

## 2023-07-07 ENCOUNTER — Ambulatory Visit (INDEPENDENT_AMBULATORY_CARE_PROVIDER_SITE_OTHER): Payer: Medicare Other

## 2023-07-07 DIAGNOSIS — I442 Atrioventricular block, complete: Secondary | ICD-10-CM | POA: Diagnosis not present

## 2023-07-08 LAB — CUP PACEART REMOTE DEVICE CHECK
Battery Remaining Longevity: 108 mo
Battery Voltage: 3.01 V
Brady Statistic AP VP Percent: 30.27 %
Brady Statistic AP VS Percent: 0 %
Brady Statistic AS VP Percent: 69.54 %
Brady Statistic AS VS Percent: 0.19 %
Brady Statistic RA Percent Paced: 30.24 %
Brady Statistic RV Percent Paced: 99.81 %
Date Time Interrogation Session: 20250513004608
Implantable Lead Connection Status: 753985
Implantable Lead Connection Status: 753985
Implantable Lead Implant Date: 20210816
Implantable Lead Implant Date: 20210816
Implantable Lead Location: 753859
Implantable Lead Location: 753860
Implantable Lead Model: 3830
Implantable Lead Model: 5076
Implantable Pulse Generator Implant Date: 20210816
Lead Channel Impedance Value: 399 Ohm
Lead Channel Impedance Value: 399 Ohm
Lead Channel Impedance Value: 570 Ohm
Lead Channel Impedance Value: 570 Ohm
Lead Channel Pacing Threshold Amplitude: 0.625 V
Lead Channel Pacing Threshold Amplitude: 0.75 V
Lead Channel Pacing Threshold Pulse Width: 0.4 ms
Lead Channel Pacing Threshold Pulse Width: 0.4 ms
Lead Channel Sensing Intrinsic Amplitude: 31.625 mV
Lead Channel Sensing Intrinsic Amplitude: 31.625 mV
Lead Channel Sensing Intrinsic Amplitude: 8 mV
Lead Channel Sensing Intrinsic Amplitude: 8 mV
Lead Channel Setting Pacing Amplitude: 1.5 V
Lead Channel Setting Pacing Amplitude: 2 V
Lead Channel Setting Pacing Pulse Width: 0.4 ms
Lead Channel Setting Sensing Sensitivity: 0.9 mV
Zone Setting Status: 755011

## 2023-07-12 ENCOUNTER — Ambulatory Visit: Payer: Self-pay | Admitting: Cardiology

## 2023-07-27 ENCOUNTER — Encounter: Payer: Self-pay | Admitting: Cardiovascular Disease

## 2023-07-27 ENCOUNTER — Ambulatory Visit: Attending: Cardiovascular Disease | Admitting: Cardiovascular Disease

## 2023-07-27 VITALS — BP 132/68 | HR 84 | Ht 72.0 in | Wt 179.6 lb

## 2023-07-27 DIAGNOSIS — Z953 Presence of xenogenic heart valve: Secondary | ICD-10-CM | POA: Diagnosis present

## 2023-07-27 DIAGNOSIS — Z9889 Other specified postprocedural states: Secondary | ICD-10-CM | POA: Diagnosis present

## 2023-07-27 NOTE — Progress Notes (Signed)
 Patient ID: Alan [name], male   DOB: 11/20/1950, 73 y.o.   MRN: [id number]     Cardiology Office Note    Date:  07/28/2023   ID:  Alan [name], DOB 1951-02-07, MRN [id number]  PCP:  Alan Mount, DO (Inactive)  Cardiologist:   Alan Rumple, MD   Chief Complaint  Patient presents with   Cardiac Valve Problem    History of Present Illness:  Alan [name] is a 73 y.o. 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 He underwent aortic valve replacement with a biological prosthesis and mitral valve repair as well as a single-vessel LIMA to LAD bypass for incidentally discovered CAD and atricure clipping of the left atrial appendage.  He later had recurrent problems with atrial fibrillation and severe mitral insufficiency.  In August 2021 he had to undergo repeat sternotomy for severe mitral regurgitation and received a bioprosthetic mitral valve replacement and also underwent tricuspid valve annuloplasty repair and surgical maze.  He is accompanied today by his daughter, Alan [name].  He lives with his wife Alan [name], who has more advanced problems with dementia.  He does not feel well, but the complaints are not cardiovascular.  He feels that he has "brain fog", his depression is worse and he has frequent migraines.  He has not had any trouble shortness of breath, dizziness, palpitations, syncope, chest pain, lower extremity edema or claudication.  He has not had any new focal neurological complaints.  He has not had any episodes of what sounds like true vertigo.  He has not had any recent falls.  Pacemaker interrogation shows normal device function.  Presenting rhythm is atrial sensed, ventricular paced.  Estimated generator longevity is just under 9 years.  He has 33.5% atrial pacing and 99.6% ventricular pacing.  Lead parameters  are all excellent.  He has had occasional very brief episodes of atrial mode switch each lasting less than a minute in duration.  The cumulative burden of mode switch since December 2021 is only 38 minutes.  There is no evidence of true atrial fibrillation.  He is paced QRS is atypical with a right bundle branch morphology and a very narrow intrinsicoid deflection, consistent with highly targeted LBBB pacing.  He has not had atrial flutter since the cardioversion on February 2020, despite being off antiarrhythmics.  He has not had an echocardiogram since October 2022, but at that time both prosthetic valves were functioning well.  His postoperative echocardiogram performed on 11/21/2019 showed excellent findings.  LVEF was 55-60%.  The gradients were normal across the mitral and aortic valve prostheses and across the tricuspid valve annuloplasty repair.  He has moderate biatrial dilation.  The estimated systolic pulmonary artery pressure was dramatically improved at only 28 mmHg.  On November 01, 2014, he underwent AVR with a bioprosthesis (23 mm MagnaEase), patch enlargement of the aortic root (Nicks' technique) and MV repair without annuloplasty ring, as well as single-vessel LIMA to LAD bypass for incidentally discovered CAD and clipping of the left atrial appendage (40 mm Atricure clip). Postop atrial flutter led to treatment with amiodarone  and cardioversion on October 24 and a second cardioversion on January 11, 2015.  As the arrhythmia did not recur  anticoagulants were then discontinued, but were restarted when he presented with atrial flutter with 2: 1 AV block in January 2020.  April 12, 2018 received 2 stents to the  proximal LAD upstream of the LIMA bypass and the left circumflex coronary artery (resolute onyx 2.030).  In January 2020 he presented with atrial flutter with 2: 1 AV block and exertional dyspnea, but without angina.  He underwent cardioversion on April 07, 2018 and had profound  and protracted hypotension that did not respond to IV fluids and pressors.  TEE showed severe  mitral insufficiency.  Cardiac catheterization April 12 2018 was followed by placement of stents in the native LAD artery upstream of the LIMA bypass and the left circumflex coronary artery (both stents were resolute onyx 2.0 x 30).  He improved rapidly thereafter and by physical exam his mitral insufficiency appeared markedly improved.  Follow-up echocardiogram showed only mild mitral insufficiency and normal left ventricular systolic function.  In August 2020 he underwent a stress myocardial perfusion study that did not show any perfusion defects (although he did have "false positive" ST segment depression during Lexiscan  infusion).  His echocardiogram performed around the same time showed normal left ventricular ejection fraction at 60-65%, moderately dilated left atrium, normal function of the aortic valve bioprosthesis, mild mitral insufficiency.  He had worsening exertional dyspnea and another echocardiogram performed in May 2021 showed pulmonary hypertension with estimated systolic PA P of 58 mmHg.  On transthoracic echo his mitral regurgitation was estimated to be mild to moderate, but T on July 28, 2019 showed severe mitral regurgitation.  There was also moderate to severe tricuspid regurgitation.  LV function remained normal.  Right and left heart catheterization on July 2021 showed occlusion of the proximal-mid LAD (with patent LIMA to the LAD), patent stents in the first OM and left PDA, 80% stenosis in nondominant right coronary artery.  On October 05, 2019 he underwent repeat sternotomy with mitral valve replacement (Medtronic Mosaic bioprosthesis, 29 mm), tricuspid valve repair [name] 3 ring annuloplasty, 28 mm), and maze with cryotherapy and bipolar radiofrequency ablation (Dr. Alva [name]).  He had postoperative complete heart block and on 10/13/2019 received a Medtronic Azure dual-chamber pacemaker (Dr.  Carolynne [name]).  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   Arthritis    hands   Atrial flutter with rapid ventricular response (HCC) s/p TEE/DCCV 07/28/19 successful 10/19/2014   CHF (congestive heart failure) (HCC)    Coronary artery disease    Depression    Dyspnea    Dysrhythmia    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   Enterococcal bacteremia 10/19/2014   Heart murmur    Hepatitis    C   Hypertension    Memory difficulty    Migraine 09/12/2015   Mini stroke    x22   Mitral valve regurgitation, 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/2014   Paroxysmal atrial fibrillation (HCC) 10/19/2014   Paroxysmal atrial flutter (HCC) 10/19/2014   Prostate cancer (HCC) dx'd 2015   surg only per pt   Protein-calorie malnutrition, severe (HCC)    Restless leg syndrome    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/2014   23 mm [facility] Ease bovine pericardial bioprosthetic tissue valve with bovine pericardial patch enlargement of aortic root   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   LIMA to LAD   S/P Maze operation for atrial fibrillation 10/05/2019   Complete bilateral atrial lesion set using cryothermy and bipolar radiofrequency ablation   S/P mitral valve repair 11/01/2014   Debridement of vegetation on anterior leaflet   S/P mitral valve replacement with bioprosthetic valve  10/05/2019   29 mm Medtronic Mosaic stented porcine bioprosthetic tissue valve   S/P tricuspid valve repair 10/05/2019   28 mm Edwards mc3 ring annuloplasty   Septic embolism (HCC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   Tricuspid regurgitation     Past Surgical History:  Procedure Laterality Date   AORTIC ROOT ENLARGEMENT N/A 11/01/2014   Procedure: AORTIC ROOT ENLARGEMENT;  Surgeon: Gardenia Jump, MD;  Location: [facility] OR;  Service: Open Heart Surgery;   Laterality: N/A;   AORTIC VALVE REPLACEMENT N/A 11/01/2014   Procedure: AORTIC VALVE REPLACEMENT (AVR);  Surgeon: Gardenia Jump, MD;  Location: [facility] OR;  Service: Open Heart Surgery;  Laterality: N/A;   CARDIAC CATHETERIZATION N/A 10/23/2014   Procedure: Right/Left Heart Cath and Coronary Angiography;  Surgeon: Avanell Leigh, MD;  Location: [facility] INVASIVE CV LAB;  Service: Cardiovascular;  Laterality: N/A;   CARDIOVERSION N/A 12/18/2014   Procedure: CARDIOVERSION;  Surgeon: Hazle Lites, MD;  Location: [facility] ENDOSCOPY;  Service: Cardiovascular;  Laterality: N/A;   CARDIOVERSION N/A 02/06/2015   Procedure: CARDIOVERSION;  Surgeon: Darlis Eisenmenger, MD;  Location: [facility] ENDOSCOPY;  Service: Cardiovascular;  Laterality: N/A;   CARDIOVERSION N/A 04/07/2018   Procedure: CARDIOVERSION;  Surgeon: Alan Rumple, MD;  Location: [facility] ENDOSCOPY;  Service: Cardiovascular;  Laterality: N/A;   CARDIOVERSION N/A 07/28/2019   Procedure: CARDIOVERSION;  Surgeon: Lenise Quince, MD;  Location: [facility] ENDOSCOPY;  Service: Cardiovascular;  Laterality: N/A;   COLONOSCOPY N/A 10/25/2014   Procedure: COLONOSCOPY;  Surgeon: Evangeline Hilts, MD;  Location: [facility] ENDOSCOPY;  Service: Endoscopy;  Laterality: N/A;   CORONARY ARTERY BYPASS GRAFT N/A 11/01/2014   Procedure: CORONARY ARTERY BYPASS GRAFTING (CABG) x 1 using internal mammary artery;  Surgeon: Gardenia Jump, MD;  Location: [facility] OR;  Service: Open Heart Surgery;  Laterality: N/A;   CORONARY CTO INTERVENTION N/A 04/12/2018   Procedure: CORONARY CTO INTERVENTION;  Surgeon: Arnoldo Lapping, MD;  Location: [facility] INVASIVE CV LAB;  Service: Cardiovascular;  Laterality: N/A;   CORONARY STENT INTERVENTION N/A 04/12/2018   Procedure: CORONARY STENT INTERVENTION;  Surgeon: Arnoldo Lapping, MD;  Location: [facility] INVASIVE CV LAB;  Service: Cardiovascular;  Laterality: N/A;   EYE SURGERY Bilateral    cataracts   MAZE N/A 10/05/2019   Procedure: MAZE;  Surgeon: Gardenia Jump, MD;  Location: [facility] OR;   Service: Open Heart Surgery;  Laterality: N/A;   MITRAL VALVE REPAIR N/A 11/01/2014   Procedure: MITRAL VALVE REPAIR with no Ring;  Surgeon: Gardenia Jump, MD;  Location: [facility] OR;  Service: Open Heart Surgery;  Laterality: N/A;   MITRAL VALVE REPLACEMENT N/A 10/05/2019   Procedure: MITRAL VALVE (MV) REPLACEMENT USING MOSAIC VALVE;  Surgeon: Gardenia Jump, MD;  Location: [facility] OR;  Service: Open Heart Surgery;  Laterality: N/A;   PACEMAKER IMPLANT N/A 10/10/2019   Procedure: PACEMAKER IMPLANT;  Surgeon: Tammie Fall, MD;  Location: [facility] INVASIVE CV LAB;  Service: Cardiovascular;  Laterality: N/A;   PROSTATECTOMY     RIGHT/LEFT HEART CATH AND CORONARY ANGIOGRAPHY N/A 09/13/2019   Procedure: RIGHT/LEFT HEART CATH AND CORONARY ANGIOGRAPHY;  Surgeon: Swaziland, Peter M, MD;  Location: [facility] INVASIVE CV LAB;  Service: Cardiovascular;  Laterality: N/A;   RIGHT/LEFT HEART CATH AND CORONARY/GRAFT ANGIOGRAPHY N/A 04/12/2018   Procedure: RIGHT/LEFT HEART CATH AND CORONARY/GRAFT ANGIOGRAPHY;  Surgeon: Arnoldo Lapping, MD;  Location: [facility] INVASIVE CV LAB;  Service: Cardiovascular;  Laterality: N/A;  TEE WITHOUT CARDIOVERSION N/A 10/20/2014   Procedure: TRANSESOPHAGEAL ECHOCARDIOGRAM (TEE);  Surgeon: Loyde Rule, MD;  Location: [facility] ENDOSCOPY;  Service: Cardiovascular;  Laterality: N/A;   TEE WITHOUT CARDIOVERSION N/A 11/01/2014   Procedure: TRANSESOPHAGEAL ECHOCARDIOGRAM (TEE);  Surgeon: Gardenia Jump, MD;  Location: [facility] OR;  Service: Open Heart Surgery;  Laterality: N/A;   TEE WITHOUT CARDIOVERSION N/A 04/08/2018   Procedure: TRANSESOPHAGEAL ECHOCARDIOGRAM (TEE);  Surgeon: Hugh Madura, MD;  Location: [facility] ENDOSCOPY;  Service: Cardiovascular;  Laterality: N/A;   TEE WITHOUT CARDIOVERSION N/A 07/28/2019   Procedure: TRANSESOPHAGEAL ECHOCARDIOGRAM (TEE);  Surgeon: Lenise Quince, MD;  Location: [facility] ENDOSCOPY;  Service: Cardiovascular;  Laterality: N/A;   TEE WITHOUT CARDIOVERSION N/A 10/05/2019   Procedure:  TRANSESOPHAGEAL ECHOCARDIOGRAM (TEE);  Surgeon: Gardenia Jump, MD;  Location: [facility] OR;  Service: Open Heart Surgery;  Laterality: N/A;   TONSILLECTOMY     TRICUSPID VALVE REPLACEMENT N/A 10/05/2019   Procedure: TRICUSPID VALVE REPAIR USING MC3 ANNULOPLASTY RING;  Surgeon: Gardenia Jump, MD;  Location: [facility] OR;  Service: Open Heart Surgery;  Laterality: N/A;    Current Outpatient Medications  Medication Sig Dispense Refill   acetaminophen  (TYLENOL ) 325 MG tablet Take 2 tablets (650 mg total) by mouth every 4 (four) hours as needed for headache or mild pain.     atorvastatin  (LIPITOR) 40 MG tablet Take 1 tablet (40 mg total) by mouth daily. 90 tablet 0   gabapentin  (NEURONTIN ) 300 MG capsule Take 300 mg by mouth See admin instructions. Take one capsule (300 mg) by mouth twice daily - with supper and at bedtime     Multiple Vitamins-Minerals (PRESERVISION AREDS 2) CAPS Take 1 capsule by mouth daily.     sertraline  (ZOLOFT ) 100 MG tablet Take 100 mg by mouth daily.     sertraline  (ZOLOFT ) 50 MG tablet Take 50 mg by mouth daily.     albuterol  (VENTOLIN  HFA) 108 (90 Base) MCG/ACT inhaler Inhale 2 puffs into the lungs every 6 (six) hours as needed for wheezing or shortness of breath.  (Patient not taking: Reported on 07/27/2023)     amoxicillin  (AMOXIL ) 500 MG capsule Take four capsules 30 - 60 minutes before dental appointment. (Patient not taking: Reported on 07/27/2023) 4 capsule 2   BIOTIN PO Take 1 tablet by mouth daily. (Patient not taking: Reported on 07/27/2023)     calcium -vitamin D (OSCAL WITH D) 500-200 MG-UNIT tablet Take 1 tablet by mouth daily. (Patient not taking: Reported on 07/27/2023)     diphenhydrAMINE  HCl, Sleep, (UNISOM SLEEPMELTS PO) Take 1 tablet by mouth at bedtime. (Patient not taking: Reported on 07/27/2023)     doxylamine, Sleep, (UNISOM) 25 MG tablet Take 12.5 mg by mouth at bedtime. (Patient not taking: Reported on 07/27/2023)     Erenumab -aooe (AIMOVIG ) 140 MG/ML SOAJ Inject 140  mg into the skin every 30 (thirty) days. (Patient not taking: Reported on 07/27/2023) 1.12 mL 4   famotidine  (PEPCID ) 40 MG tablet Take 1 tablet (40 mg total) by mouth daily. (Patient not taking: Reported on 07/27/2023) 30 tablet 0   meclizine  (ANTIVERT ) 12.5 MG tablet Take 1 tablet (12.5 mg total) by mouth 2 (two) times daily as needed for dizziness. (Patient not taking: Reported on 07/27/2023) 12 tablet 0   Multiple Vitamins-Minerals (CENTRUM SILVER 50+MEN PO) Take 1 tablet by mouth daily. (Patient not taking: Reported on 07/27/2023)     Omega-3 Fatty Acids (OMEGA-3 PO) Take 1 capsule by mouth daily. (Patient not taking: Reported on  07/27/2023)     Probiotic Product (PROBIOTIC PO) Take 1 capsule by mouth 2 (two) times daily.  (Patient not taking: Reported on 07/27/2023)     Turmeric 500 MG CAPS Take 500 mg by mouth daily. (Patient not taking: Reported on 07/27/2023)     No current facility-administered medications for this visit.    Allergies:   Pollen extract   Family History:  The patient's family history includes Stroke in his father.     PHYSICAL EXAM:   VS:  BP 132/68 (BP Location: Left Arm, Patient Position: Sitting)   Pulse 84   Ht 6' (1.829 m)   Wt 81.5 kg   SpO2 97%   BMI 24.36 kg/m      General: Alert, oriented x3, no distress, appears lean and fit.  The left subclavian pacemaker site looks healthy. Head: no evidence of trauma, PERRL, EOMI, no exophtalmos or lid lag, no myxedema, no xanthelasma; normal ears, nose and oropharynx Neck: normal jugular venous pulsations and no hepatojugular reflux; brisk carotid pulses without delay and no carotid bruits Chest: clear to auscultation, no signs of consolidation by percussion or palpation, normal fremitus, symmetrical and full respiratory excursions Cardiovascular: normal position and quality of the apical impulse, regular rhythm, normal first and second heart sounds, 2/6 early peaking aortic ejection murmur at the right upper sternal border,  1/6 harsh holosystolic murmur in the left lower sternal border, no apical systolic murmurs, no diastolic murmurs, rubs or gallops Abdomen: no tenderness or distention, no masses by palpation, no abnormal pulsatility or arterial bruits, normal bowel sounds, no hepatosplenomegaly Extremities: no clubbing, cyanosis or edema; 2+ radial, ulnar and brachial pulses bilaterally; 2+ right femoral, posterior tibial and dorsalis pedis pulses; 2+ left femoral, posterior tibial and dorsalis pedis pulses; no subclavian or femoral bruits Neurological: grossly nonfocal Psych: Normal mood and affect    Wt Readings from Last 3 Encounters:  07/27/23 81.5 kg  06/02/23 81.2 kg  08/29/20 76.2 kg    Studies/Labs Reviewed:   ECHO 11/21/2019:  1. 1 months post MV replacement and TV repair. Both prosthesis are  functioning properly with only trivial mitral and tricuspid regurgitation.   2. Left ventricular ejection fraction, by estimation, is 55 to 60%. The  left ventricle has normal function. The left ventricle has no regional  wall motion abnormalities. There is severe concentric left ventricular  hypertrophy. Left ventricular diastolic   function could not be evaluated.   3. Right ventricular systolic function is mildly reduced. The right  ventricular size is moderately enlarged. There is normal pulmonary artery  systolic pressure. The estimated right ventricular systolic pressure is  27.6 mmHg.   4. Left atrial size was moderately dilated.   5. Right atrial size was moderately dilated.   6. The mitral valve has been repaired/replaced. Trivial mitral valve  regurgitation. No evidence of mitral stenosis. There is a 29 mm Medtronic  Mosaic stented porcine model #310 present in the mitral position.  Procedure Date: 10/05/19. Echo findings are  consistent with normal structure and function of the mitral valve  prosthesis.   7. Edwards mc3 ring annuloplasty (size 28mm). The tricuspid valve is has  been  repaired/replaced.   8. The aortic valve has been repaired/replaced. Aortic valve  regurgitation is not visualized. No aortic stenosis is present. There is a  bioprosthetic valve present in the aortic position. Echo findings are  consistent with normal structure and function  of the aortic valve prosthesis. Aortic valve mean gradient measures 6.0  mmHg.   9. The inferior vena cava is normal in size with greater than 50%  respiratory variability, suggesting right atrial pressure of 3 mmHg.   EKG: Personally reviewed ECG from 06/02/2023 which shows sinus rhythm with rare atrial paced beats, ventricular paced rhythm, occasional premature ventricular contractions, paced QRS morphology shows RBBB pattern with relatively narrow QRS at 141 ms, QTc 447 ms.  Sharp Q waves are seen inferior laterally.     EKG Interpretation Date/Time:    Ventricular Rate:    PR Interval:    QRS Duration:    QT Interval:    QTC Calculation:   R Axis:      Text Interpretation:            Recent Labs: 06/02/2023: BUN 11; Creatinine, Ser 1.03; Hemoglobin 14.4; Platelets 149; Potassium 4.0; Sodium 139   Lipid Panel    Component Value Date/Time   CHOL 154 03/06/2016 0954   TRIG 71 03/06/2016 0954   HDL 45 03/06/2016 0954   CHOLHDL 3.4 03/06/2016 0954   VLDL 14 03/06/2016 0954   LDLCALC 95 03/06/2016 0954   01/26/2019 Total cholesterol 119, HDL 45, triglycerides 83, LDL 58  ASSESSMENT:    1. S/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   2. H/O mitral valve repair       PLAN:  In order of problems listed above:   1. CAD: He has never really had angina pectoris and his coronary problems were identified incidentally on angiography performed prior to valve surgery.  When last evaluated, he has an occluded LAD with patent LIMA bypass and patent stents in his circumflex system.  On statin therapy his LDL cholesterol earlier this year was excellent.  He is not on aspirin  (I suspect since in  the past he was on anticoagulation, which he is no longer receiving).  Recommend starting aspirin  81 mg daily. 2. AFlutter/fibrillation: His pacemaker has not recorded any atrial arrhythmic events longer than a minute since 2021.   He has an Atricure clip.  He is no longer on anticoagulation. 3. Bioprosthetic AVR and MVR: Plan to repeat his echo.  He is reminded of the need for endocarditis prophylaxis. 4. S/P TV repair: Holosystolic murmur tricuspid regurgitation is very faint.  Reevaluate by echo. 5. Depression: This is a lifelong problem.  Now also has multiple atypical neurological complaints that may be related to his previous 2 cardiopulmonary bypass runs, some concern that he may be developing dementia.   His wife's memory problems are compounding his difficulties. Recommended follow-up with neurology and getting a MMSE with a neuropsychologist. If an MRI of the brain is deemed necessary, his device is MRI conditional and he can have the scan. 6. HLP: Excellent LDL 68 on statin. 7. CHB: He is pacemaker dependent. 8. Pacemaker: MRI conditional system monitored by Dr. Marven Slimmer.  Normal pacemaker function.  Medication Adjustments/Labs and Tests Ordered: Current medicines are reviewed at length with the patient today.  Concerns regarding medicines are outlined above.  Medication changes, Labs and Tests ordered today are listed below. Patient Instructions  Medication Instructions:  No changes *If you need a refill on your cardiac medications before your next appointment, please call your pharmacy*  MyChart password changed to: [id number]  Testing/Procedures: Your physician has requested that you have an echocardiogram. Echocardiography is a painless test that uses sound waves to create images of your heart. It provides your doctor with information about the size and shape of your heart and how well your heart's  chambers and valves are working. This procedure takes approximately one hour. There are  no restrictions for this procedure. Please do NOT wear cologne, perfume, aftershave, or lotions (deodorant is allowed). Please arrive 15 minutes prior to your appointment time.  Please note: We ask at that you not bring children with you during ultrasound (echo/ vascular) testing. Due to room size and safety concerns, children are not allowed in the ultrasound rooms during exams. Our front office staff cannot provide observation of children in our lobby area while testing is being conducted. An adult accompanying a patient to their appointment will only be allowed in the ultrasound room at the discretion of the ultrasound technician under special circumstances. We apologize for any inconvenience.   Follow-Up: At [facility], you and your health needs are our priority.  As part of our continuing mission to provide you with exceptional heart care, our providers are all part of one team.  This team includes your primary Cardiologist (physician) and Advanced Practice Providers or APPs (Physician Assistants and Nurse Practitioners) who all work together to provide you with the care you need, when you need it.  Your next appointment:   1 year(s)  Provider:   Luana Rumple, MD    We recommend signing up for the patient portal called "MyChart".  Sign up information is provided on this After Visit Summary.  MyChart is used to connect with patients for Virtual Visits (Telemedicine).  Patients are able to view lab/test results, encounter notes, upcoming appointments, etc.  Non-urgent messages can be sent to your provider as well.   To learn more about what you can do with MyChart, go to [email or web address].          Signed, Alan Rumple, MD  07/28/2023 8:32 AM    [facility] [address] [place], [place], [place]  [address] Phone: [phone]; Fax: [phone]

## 2023-07-27 NOTE — Patient Instructions (Addendum)
 Medication Instructions:  No changes *If you need a refill on your cardiac medications before your next appointment, please call your pharmacy*  MyChart password changed to: [id number]  Testing/Procedures: Your physician has requested that you have an echocardiogram. Echocardiography is a painless test that uses sound waves to create images of your heart. It provides your doctor with information about the size and shape of your heart and how well your heart's chambers and valves are working. This procedure takes approximately one hour. There are no restrictions for this procedure. Please do NOT wear cologne, perfume, aftershave, or lotions (deodorant is allowed). Please arrive 15 minutes prior to your appointment time.  Please note: We ask at that you not bring children with you during ultrasound (echo/ vascular) testing. Due to room size and safety concerns, children are not allowed in the ultrasound rooms during exams. Our front office staff cannot provide observation of children in our lobby area while testing is being conducted. An adult accompanying a patient to their appointment will only be allowed in the ultrasound room at the discretion of the ultrasound technician under special circumstances. We apologize for any inconvenience.   Follow-Up: At [facility], you and your health needs are our priority.  As part of our continuing mission to provide you with exceptional heart care, our providers are all part of one team.  This team includes your primary Cardiologist (physician) and Advanced Practice Providers or APPs (Physician Assistants and Nurse Practitioners) who all work together to provide you with the care you need, when you need it.  Your next appointment:   1 year(s)  Provider:   Luana Rumple, MD    We recommend signing up for the patient portal called "MyChart".  Sign up information is provided on this After Visit Summary.  MyChart is used to connect with patients for  Virtual Visits (Telemedicine).  Patients are able to view lab/test results, encounter notes, upcoming appointments, etc.  Non-urgent messages can be sent to your provider as well.   To learn more about what you can do with MyChart, go to [email or web address].

## 2023-07-28 ENCOUNTER — Encounter: Payer: Self-pay | Admitting: Cardiovascular Disease

## 2023-07-28 MED ORDER — ASPIRIN 81 MG PO TBEC: 81.0000 mg | DELAYED_RELEASE_TABLET | Freq: Every day | ORAL | 12 refills | Status: AC

## 2023-08-21 NOTE — Addendum Note (Signed)
 Addended by: VICCI SELLER A on: 08/21/2023 12:23 PM   Modules accepted: Orders

## 2023-08-21 NOTE — Progress Notes (Signed)
 Remote pacemaker transmission.

## 2023-09-09 ENCOUNTER — Ambulatory Visit: Payer: Self-pay | Admitting: Cardiovascular Disease

## 2023-09-09 ENCOUNTER — Ambulatory Visit (HOSPITAL_COMMUNITY)
Admission: RE | Admit: 2023-09-09 | Discharge: 2023-09-09 | Disposition: A | Discharge status: Home / Self Care | Source: Ambulatory Visit | Attending: Cardiovascular Disease | Admitting: Cardiovascular Disease

## 2023-09-09 DIAGNOSIS — I361 Nonrheumatic tricuspid (valve) insufficiency: Secondary | ICD-10-CM

## 2023-09-09 DIAGNOSIS — Z953 Presence of xenogenic heart valve: Secondary | ICD-10-CM

## 2023-09-09 DIAGNOSIS — Z9889 Other specified postprocedural states: Secondary | ICD-10-CM | POA: Insufficient documentation

## 2023-09-09 LAB — ECHOCARDIOGRAM COMPLETE
AR max vel: 1.59 cm2
AV Area VTI: 1.66 cm2
AV Area mean vel: 1.72 cm2
AV Mean grad: 8.8 mmHg
AV Peak grad: 15.6 mmHg
Ao pk vel: 1.98 m/s
Area-P 1/2: 2.51 cm2
MV VTI: 1.54 cm2
S' Lateral: 3.3 cm

## 2023-10-06 ENCOUNTER — Ambulatory Visit: Payer: Medicare Other

## 2023-10-06 DIAGNOSIS — I442 Atrioventricular block, complete: Secondary | ICD-10-CM | POA: Diagnosis not present

## 2023-10-07 LAB — CUP PACEART REMOTE DEVICE CHECK
Battery Remaining Longevity: 103 mo
Battery Voltage: 3.01 V
Brady Statistic AP VP Percent: 54.18 %
Brady Statistic AP VS Percent: 0.03 %
Brady Statistic AS VP Percent: 45.05 %
Brady Statistic AS VS Percent: 0.75 %
Brady Statistic RA Percent Paced: 54.5 %
Brady Statistic RV Percent Paced: 99.23 %
Date Time Interrogation Session: 20250812004702
Implantable Lead Connection Status: 753985
Implantable Lead Connection Status: 753985
Implantable Lead Implant Date: 20210816
Implantable Lead Implant Date: 20210816
Implantable Lead Location: 753859
Implantable Lead Location: 753860
Implantable Lead Model: 3830
Implantable Lead Model: 5076
Implantable Pulse Generator Implant Date: 20210816
Lead Channel Impedance Value: 342 Ohm
Lead Channel Impedance Value: 380 Ohm
Lead Channel Impedance Value: 494 Ohm
Lead Channel Impedance Value: 532 Ohm
Lead Channel Pacing Threshold Amplitude: 0.625 V
Lead Channel Pacing Threshold Amplitude: 0.75 V
Lead Channel Pacing Threshold Pulse Width: 0.4 ms
Lead Channel Pacing Threshold Pulse Width: 0.4 ms
Lead Channel Sensing Intrinsic Amplitude: 31.625 mV
Lead Channel Sensing Intrinsic Amplitude: 31.625 mV
Lead Channel Sensing Intrinsic Amplitude: 6.5 mV
Lead Channel Sensing Intrinsic Amplitude: 6.5 mV
Lead Channel Setting Pacing Amplitude: 1.5 V
Lead Channel Setting Pacing Amplitude: 2 V
Lead Channel Setting Pacing Pulse Width: 0.4 ms
Lead Channel Setting Sensing Sensitivity: 0.9 mV
Zone Setting Status: 755011

## 2023-10-08 ENCOUNTER — Ambulatory Visit: Payer: Self-pay | Admitting: Cardiology

## 2023-11-12 ENCOUNTER — Telehealth: Payer: Self-pay

## 2023-11-12 NOTE — Telephone Encounter (Signed)
   Pre-operative Risk Assessment    Patient Name: Alan [name]  DOB: [birth year] MRN: [id number]   Date of last office visit: 07/27/23 JEREL BALDING, MD Date of next office visit: NONE   Request for Surgical Clearance    Procedure:  ORIF WRIST  Date of Surgery:  Clearance 11/18/23                                Surgeon:  NOT INDICATED Surgeon's Group or Practice Name:  [facility] Phone number:  [phone]  EXT 226 Fax number:  [phone]   Type of Clearance Requested:   - Medical  - Pharmacy:  Hold Aspirin      Type of Anesthesia:  Not Indicated   Additional requests/questions:    SignedLucie DELENA Ku   11/12/2023, 2:08 PM

## 2023-11-13 ENCOUNTER — Encounter: Payer: Self-pay | Admitting: Cardiovascular Disease

## 2023-11-13 NOTE — Progress Notes (Signed)
 PERIOPERATIVE PRESCRIPTION FOR IMPLANTED CARDIAC DEVICE PROGRAMMING  Patient Information: Name:  Alan [name]  DOB:  May 01, 1950  MRN:  [id number]   Procedure:  ORIF WRIST   Date of Surgery:  Clearance 11/18/23                                Surgeon:  NOT INDICATED Surgeon's Group or Practice Name:  [facility] Phone number:  [phone]  EXT 226 Fax number:  [phone]   Type of Clearance Requested:   -pacemaker recommendations   Device Information:  Clinic EP Physician:  Dr.  Jerel Croitoru  Device Type:  Pacemaker Manufacturer and Phone #:  [employer]: [phone] Pacemaker Dependent?:  Yes.   Date of Last Device Check:  10/06/2023 Normal Device Function?:  Yes.    Electrophysiologist's Recommendations:  Have magnet available. Provide continuous ECG monitoring when magnet is used or reprogramming is to be performed.  Procedure may interfere with device function.  Magnet should be placed over device during procedure.  Per Device Clinic Standing Orders, [name], RN  2:10 PM 11/13/2023

## 2023-11-13 NOTE — Telephone Encounter (Signed)
 OK to hold ASA, OK for surgery

## 2023-11-13 NOTE — Telephone Encounter (Signed)
   Patient Name: Alan [name]  DOB: 1950-10-03 MRN: [id number]  Primary Cardiologist: Jerel Balding, MD  Chart reviewed as part of pre-operative protocol coverage.  Last seen in the office on 07/28/2023 by Dr. Balding.  Per Dr. Balding, OK to hold ASA, OK for surgery.  Per office protocol, he may hold Aspirin  for 5-7 days prior to procedure. Please resume Aspirin  as soon as possible postprocedure, at the discretion of the surgeon.   I will route this recommendation to the requesting party via Epic fax function and remove from pre-op pool.  Please call with questions.  Damien JAYSON Braver, NP 11/13/2023, 1:52 PM

## 2023-11-19 NOTE — Progress Notes (Signed)
 Remote PPM Transmission

## 2024-01-05 ENCOUNTER — Ambulatory Visit: Payer: Medicare Other

## 2024-01-05 DIAGNOSIS — I442 Atrioventricular block, complete: Secondary | ICD-10-CM

## 2024-01-05 LAB — CUP PACEART REMOTE DEVICE CHECK
Battery Remaining Longevity: 102 mo
Battery Voltage: 3 V
Brady Statistic AP VP Percent: 42.57 %
Brady Statistic AP VS Percent: 0.02 %
Brady Statistic AS VP Percent: 56.66 %
Brady Statistic AS VS Percent: 0.75 %
Brady Statistic RA Percent Paced: 42.9 %
Brady Statistic RV Percent Paced: 99.23 %
Date Time Interrogation Session: 20251111010844
Implantable Lead Connection Status: 753985
Implantable Lead Connection Status: 753985
Implantable Lead Implant Date: 20210816
Implantable Lead Implant Date: 20210816
Implantable Lead Location: 753859
Implantable Lead Location: 753860
Implantable Lead Model: 3830
Implantable Lead Model: 5076
Implantable Pulse Generator Implant Date: 20210816
Lead Channel Impedance Value: 361 Ohm
Lead Channel Impedance Value: 399 Ohm
Lead Channel Impedance Value: 532 Ohm
Lead Channel Impedance Value: 570 Ohm
Lead Channel Pacing Threshold Amplitude: 0.625 V
Lead Channel Pacing Threshold Amplitude: 0.875 V
Lead Channel Pacing Threshold Pulse Width: 0.4 ms
Lead Channel Pacing Threshold Pulse Width: 0.4 ms
Lead Channel Sensing Intrinsic Amplitude: 31.625 mV
Lead Channel Sensing Intrinsic Amplitude: 31.625 mV
Lead Channel Sensing Intrinsic Amplitude: 5.25 mV
Lead Channel Sensing Intrinsic Amplitude: 5.25 mV
Lead Channel Setting Pacing Amplitude: 1.5 V
Lead Channel Setting Pacing Amplitude: 2 V
Lead Channel Setting Pacing Pulse Width: 0.4 ms
Lead Channel Setting Sensing Sensitivity: 0.9 mV
Zone Setting Status: 755011

## 2024-01-06 ENCOUNTER — Ambulatory Visit: Payer: Self-pay | Admitting: Cardiology

## 2024-01-08 NOTE — Progress Notes (Signed)
 Remote PPM Transmission

## 2024-04-05 ENCOUNTER — Ambulatory Visit

## 2024-07-05 ENCOUNTER — Ambulatory Visit

## 2024-10-04 ENCOUNTER — Ambulatory Visit

## 2025-01-03 ENCOUNTER — Ambulatory Visit

## 2025-04-04 ENCOUNTER — Ambulatory Visit
